# Patient Record
Sex: Male | Born: 1935 | Race: Asian | Hispanic: No | Marital: Married | Smoking: Former smoker
Health system: Southern US, Community
[De-identification: ages and names within clinical notes are randomized; demographics above are authoritative.]

## PROBLEM LIST (undated history)

## (undated) DIAGNOSIS — A048 Other specified bacterial intestinal infections: Secondary | ICD-10-CM

## (undated) DIAGNOSIS — K279 Peptic ulcer, site unspecified, unspecified as acute or chronic, without hemorrhage or perforation: Secondary | ICD-10-CM

## (undated) DIAGNOSIS — F329 Major depressive disorder, single episode, unspecified: Secondary | ICD-10-CM

## (undated) DIAGNOSIS — H9190 Unspecified hearing loss, unspecified ear: Secondary | ICD-10-CM

## (undated) DIAGNOSIS — I739 Peripheral vascular disease, unspecified: Secondary | ICD-10-CM

## (undated) DIAGNOSIS — Z9981 Dependence on supplemental oxygen: Secondary | ICD-10-CM

## (undated) DIAGNOSIS — I2510 Atherosclerotic heart disease of native coronary artery without angina pectoris: Secondary | ICD-10-CM

## (undated) DIAGNOSIS — I714 Abdominal aortic aneurysm, without rupture, unspecified: Secondary | ICD-10-CM

## (undated) DIAGNOSIS — I779 Disorder of arteries and arterioles, unspecified: Secondary | ICD-10-CM

## (undated) DIAGNOSIS — E119 Type 2 diabetes mellitus without complications: Secondary | ICD-10-CM

## (undated) DIAGNOSIS — J45909 Unspecified asthma, uncomplicated: Secondary | ICD-10-CM

## (undated) DIAGNOSIS — M109 Gout, unspecified: Secondary | ICD-10-CM

## (undated) DIAGNOSIS — N400 Enlarged prostate without lower urinary tract symptoms: Secondary | ICD-10-CM

## (undated) DIAGNOSIS — D649 Anemia, unspecified: Secondary | ICD-10-CM

## (undated) DIAGNOSIS — F32A Depression, unspecified: Secondary | ICD-10-CM

## (undated) DIAGNOSIS — I10 Essential (primary) hypertension: Secondary | ICD-10-CM

## (undated) DIAGNOSIS — K449 Diaphragmatic hernia without obstruction or gangrene: Secondary | ICD-10-CM

## (undated) DIAGNOSIS — N179 Acute kidney failure, unspecified: Secondary | ICD-10-CM

## (undated) DIAGNOSIS — I509 Heart failure, unspecified: Secondary | ICD-10-CM

## (undated) HISTORY — DX: Diaphragmatic hernia without obstruction or gangrene: K44.9

## (undated) HISTORY — DX: Unspecified asthma, uncomplicated: J45.909

## (undated) HISTORY — DX: Atherosclerotic heart disease of native coronary artery without angina pectoris: I25.10

## (undated) HISTORY — DX: Acute kidney failure, unspecified: N17.9

## (undated) HISTORY — DX: Major depressive disorder, single episode, unspecified: F32.9

## (undated) HISTORY — DX: Abdominal aortic aneurysm, without rupture, unspecified: I71.40

## (undated) HISTORY — DX: Depression, unspecified: F32.A

## (undated) HISTORY — DX: Disorder of arteries and arterioles, unspecified: I77.9

## (undated) HISTORY — DX: Peptic ulcer, site unspecified, unspecified as acute or chronic, without hemorrhage or perforation: K27.9

## (undated) HISTORY — PX: ESOPHAGOGASTRODUODENOSCOPY: SHX1529

## (undated) HISTORY — DX: Other specified bacterial intestinal infections: A04.8

## (undated) HISTORY — DX: Anemia, unspecified: D64.9

## (undated) HISTORY — DX: Peripheral vascular disease, unspecified: I73.9

## (undated) HISTORY — DX: Abdominal aortic aneurysm, without rupture: I71.4

## (undated) HISTORY — DX: Benign prostatic hyperplasia without lower urinary tract symptoms: N40.0

---

## 2001-06-16 ENCOUNTER — Encounter: Admission: RE | Admit: 2001-06-16 | Discharge: 2001-06-16 | Payer: Self-pay | Admitting: Internal Medicine

## 2001-07-27 ENCOUNTER — Ambulatory Visit (HOSPITAL_COMMUNITY): Admission: RE | Admit: 2001-07-27 | Discharge: 2001-07-27 | Payer: Self-pay | Admitting: Internal Medicine

## 2001-07-27 ENCOUNTER — Encounter: Payer: Self-pay | Admitting: Internal Medicine

## 2001-07-27 ENCOUNTER — Encounter: Admission: RE | Admit: 2001-07-27 | Discharge: 2001-07-27 | Payer: Self-pay | Admitting: Internal Medicine

## 2004-05-20 ENCOUNTER — Inpatient Hospital Stay (HOSPITAL_COMMUNITY): Admission: EM | Admit: 2004-05-20 | Discharge: 2004-05-21 | Payer: Self-pay | Admitting: Emergency Medicine

## 2008-06-13 ENCOUNTER — Encounter (INDEPENDENT_AMBULATORY_CARE_PROVIDER_SITE_OTHER): Payer: Self-pay | Admitting: *Deleted

## 2008-06-13 ENCOUNTER — Inpatient Hospital Stay (HOSPITAL_COMMUNITY): Admission: EM | Admit: 2008-06-13 | Discharge: 2008-06-21 | Payer: Self-pay | Admitting: Emergency Medicine

## 2008-06-13 ENCOUNTER — Ambulatory Visit: Payer: Self-pay | Admitting: Internal Medicine

## 2008-06-13 ENCOUNTER — Ambulatory Visit: Payer: Self-pay | Admitting: Cardiology

## 2008-06-15 ENCOUNTER — Encounter (INDEPENDENT_AMBULATORY_CARE_PROVIDER_SITE_OTHER): Payer: Self-pay | Admitting: Internal Medicine

## 2008-06-19 ENCOUNTER — Encounter: Payer: Self-pay | Admitting: Internal Medicine

## 2008-06-20 ENCOUNTER — Encounter: Payer: Self-pay | Admitting: Cardiology

## 2008-07-03 ENCOUNTER — Encounter: Payer: Self-pay | Admitting: Internal Medicine

## 2008-07-19 ENCOUNTER — Ambulatory Visit: Payer: Self-pay | Admitting: Internal Medicine

## 2008-07-19 DIAGNOSIS — D62 Acute posthemorrhagic anemia: Secondary | ICD-10-CM

## 2008-07-19 DIAGNOSIS — A048 Other specified bacterial intestinal infections: Secondary | ICD-10-CM | POA: Insufficient documentation

## 2008-07-19 DIAGNOSIS — K250 Acute gastric ulcer with hemorrhage: Secondary | ICD-10-CM | POA: Insufficient documentation

## 2008-07-20 LAB — CONVERTED CEMR LAB
Basophils Relative: 0.6 % (ref 0.0–3.0)
Eosinophils Relative: 3.6 % (ref 0.0–5.0)
HCT: 32.1 % — ABNORMAL LOW (ref 39.0–52.0)
Lymphocytes Relative: 14.9 % (ref 12.0–46.0)
Lymphs Abs: 1.3 10*3/uL (ref 0.7–4.0)
Monocytes Absolute: 0.6 10*3/uL (ref 0.1–1.0)
Monocytes Relative: 6.2 % (ref 3.0–12.0)
Neutro Abs: 6.6 10*3/uL (ref 1.4–7.7)
Neutrophils Relative %: 74.7 % (ref 43.0–77.0)
WBC: 8.9 10*3/uL (ref 4.5–10.5)

## 2009-06-07 ENCOUNTER — Ambulatory Visit (HOSPITAL_COMMUNITY): Admission: RE | Admit: 2009-06-07 | Discharge: 2009-06-08 | Payer: Self-pay | Admitting: Urology

## 2009-11-21 ENCOUNTER — Ambulatory Visit (HOSPITAL_COMMUNITY): Admission: RE | Admit: 2009-11-21 | Discharge: 2009-11-21 | Payer: Self-pay | Admitting: Nephrology

## 2010-01-15 ENCOUNTER — Emergency Department (HOSPITAL_COMMUNITY)
Admission: EM | Admit: 2010-01-15 | Discharge: 2010-01-15 | Payer: Self-pay | Source: Home / Self Care | Admitting: Emergency Medicine

## 2010-03-04 ENCOUNTER — Encounter: Payer: Self-pay | Admitting: Cardiology

## 2010-03-25 ENCOUNTER — Emergency Department (HOSPITAL_COMMUNITY): Payer: PRIVATE HEALTH INSURANCE

## 2010-03-25 ENCOUNTER — Encounter (HOSPITAL_COMMUNITY): Payer: Self-pay | Admitting: Radiology

## 2010-03-25 ENCOUNTER — Inpatient Hospital Stay (HOSPITAL_COMMUNITY)
Admission: EM | Admit: 2010-03-25 | Discharge: 2010-03-29 | DRG: 392 | Disposition: A | Discharge status: Home Health | Payer: PRIVATE HEALTH INSURANCE | Attending: Internal Medicine | Admitting: Internal Medicine

## 2010-03-25 DIAGNOSIS — G8929 Other chronic pain: Secondary | ICD-10-CM | POA: Diagnosis present

## 2010-03-25 DIAGNOSIS — N189 Chronic kidney disease, unspecified: Secondary | ICD-10-CM | POA: Diagnosis present

## 2010-03-25 DIAGNOSIS — F341 Dysthymic disorder: Secondary | ICD-10-CM | POA: Diagnosis present

## 2010-03-25 DIAGNOSIS — I4891 Unspecified atrial fibrillation: Secondary | ICD-10-CM | POA: Diagnosis present

## 2010-03-25 DIAGNOSIS — I129 Hypertensive chronic kidney disease with stage 1 through stage 4 chronic kidney disease, or unspecified chronic kidney disease: Secondary | ICD-10-CM | POA: Diagnosis present

## 2010-03-25 DIAGNOSIS — M109 Gout, unspecified: Secondary | ICD-10-CM | POA: Diagnosis present

## 2010-03-25 DIAGNOSIS — J4489 Other specified chronic obstructive pulmonary disease: Secondary | ICD-10-CM | POA: Diagnosis present

## 2010-03-25 DIAGNOSIS — I252 Old myocardial infarction: Secondary | ICD-10-CM

## 2010-03-25 DIAGNOSIS — T380X5A Adverse effect of glucocorticoids and synthetic analogues, initial encounter: Secondary | ICD-10-CM | POA: Diagnosis present

## 2010-03-25 DIAGNOSIS — R109 Unspecified abdominal pain: Principal | ICD-10-CM | POA: Diagnosis present

## 2010-03-25 DIAGNOSIS — N179 Acute kidney failure, unspecified: Secondary | ICD-10-CM | POA: Diagnosis present

## 2010-03-25 DIAGNOSIS — J449 Chronic obstructive pulmonary disease, unspecified: Secondary | ICD-10-CM | POA: Diagnosis present

## 2010-03-25 DIAGNOSIS — I959 Hypotension, unspecified: Secondary | ICD-10-CM | POA: Diagnosis present

## 2010-03-25 DIAGNOSIS — E139 Other specified diabetes mellitus without complications: Secondary | ICD-10-CM | POA: Diagnosis present

## 2010-03-25 DIAGNOSIS — R0789 Other chest pain: Secondary | ICD-10-CM | POA: Diagnosis present

## 2010-03-25 DIAGNOSIS — M1710 Unilateral primary osteoarthritis, unspecified knee: Secondary | ICD-10-CM | POA: Diagnosis present

## 2010-03-25 LAB — COMPREHENSIVE METABOLIC PANEL
ALT: 14 U/L (ref 0–53)
AST: 21 U/L (ref 0–37)
BUN: 22 mg/dL (ref 6–23)
Calcium: 9 mg/dL (ref 8.4–10.5)
Creatinine, Ser: 2.2 mg/dL — ABNORMAL HIGH (ref 0.4–1.5)
Total Bilirubin: 0.8 mg/dL (ref 0.3–1.2)

## 2010-03-25 LAB — DIFFERENTIAL
Basophils Absolute: 0 10*3/uL (ref 0.0–0.1)
Eosinophils Relative: 4 % (ref 0–5)
Lymphocytes Relative: 19 % (ref 12–46)
Lymphs Abs: 1.6 10*3/uL (ref 0.7–4.0)
Monocytes Relative: 10 % (ref 3–12)
Neutro Abs: 5.5 10*3/uL (ref 1.7–7.7)
Neutrophils Relative %: 67 % (ref 43–77)

## 2010-03-25 LAB — LIPID PANEL
Cholesterol: 183 mg/dL (ref 0–200)
HDL: 34 mg/dL — ABNORMAL LOW (ref >39)
Triglycerides: 201 mg/dL — ABNORMAL HIGH (ref <150)
VLDL: 40 mg/dL (ref 0–40)

## 2010-03-25 LAB — MRSA PCR SCREENING: MRSA by PCR: NEGATIVE

## 2010-03-25 LAB — URINALYSIS, ROUTINE W REFLEX MICROSCOPIC
Bilirubin Urine: NEGATIVE
Hgb urine dipstick: NEGATIVE
Ketones, ur: NEGATIVE mg/dL
Nitrite: NEGATIVE
Urobilinogen, UA: 0.2 mg/dL (ref 0.0–1.0)

## 2010-03-25 LAB — CBC
HCT: 34.1 % — ABNORMAL LOW (ref 39.0–52.0)
MCHC: 34.9 g/dL (ref 30.0–36.0)
MCV: 82.2 fL (ref 78.0–100.0)
Platelets: 232 10*3/uL (ref 150–400)

## 2010-03-25 LAB — CARDIAC PANEL(CRET KIN+CKTOT+MB+TROPI)
CK, MB: 0.9 ng/mL (ref 0.3–4.0)
CK, MB: 1.3 ng/mL (ref 0.3–4.0)
Relative Index: INVALID (ref 0.0–2.5)
Total CK: 73 U/L (ref 7–232)
Total CK: 90 U/L (ref 7–232)

## 2010-03-25 LAB — LACTIC ACID, PLASMA: Lactic Acid, Venous: 1.3 mmol/L (ref 0.5–2.2)

## 2010-03-25 LAB — PROCALCITONIN: Procalcitonin: <0.1 ng/mL

## 2010-03-25 LAB — TSH: TSH: 2.236 u[IU]/mL (ref 0.350–4.500)

## 2010-03-25 LAB — CK TOTAL AND CKMB (NOT AT ARMC)
CK, MB: 0.8 ng/mL (ref 0.3–4.0)
Relative Index: INVALID (ref 0.0–2.5)

## 2010-03-26 LAB — CBC
HCT: 31.7 % — ABNORMAL LOW (ref 39.0–52.0)
Hemoglobin: 11.1 g/dL — ABNORMAL LOW (ref 13.0–17.0)
RBC: 3.9 MIL/uL — ABNORMAL LOW (ref 4.22–5.81)

## 2010-03-26 LAB — PHOSPHORUS: Phosphorus: 3.2 mg/dL (ref 2.3–4.6)

## 2010-03-26 LAB — BASIC METABOLIC PANEL
CO2: 23 mEq/L (ref 19–32)
Chloride: 113 mEq/L — ABNORMAL HIGH (ref 96–112)
GFR calc Af Amer: 51 mL/min — ABNORMAL LOW (ref >60)
Sodium: 143 mEq/L (ref 135–145)

## 2010-03-27 ENCOUNTER — Inpatient Hospital Stay (HOSPITAL_COMMUNITY): Payer: PRIVATE HEALTH INSURANCE

## 2010-03-27 LAB — COMPREHENSIVE METABOLIC PANEL
ALT: 8 U/L (ref 0–53)
CO2: 22 mEq/L (ref 19–32)
Calcium: 8.6 mg/dL (ref 8.4–10.5)
GFR calc non Af Amer: 44 mL/min — ABNORMAL LOW (ref >60)
Glucose, Bld: 86 mg/dL (ref 70–99)
Sodium: 139 mEq/L (ref 135–145)

## 2010-03-27 LAB — CARDIAC PANEL(CRET KIN+CKTOT+MB+TROPI): Total CK: 68 U/L (ref 7–232)

## 2010-03-27 LAB — CBC
HCT: 34.3 % — ABNORMAL LOW (ref 39.0–52.0)
Hemoglobin: 11.8 g/dL — ABNORMAL LOW (ref 13.0–17.0)
MCH: 28.2 pg (ref 26.0–34.0)
MCHC: 34.4 g/dL (ref 30.0–36.0)
MCV: 82.1 fL (ref 78.0–100.0)

## 2010-03-27 LAB — BRAIN NATRIURETIC PEPTIDE: Pro B Natriuretic peptide (BNP): 275 pg/mL — ABNORMAL HIGH (ref 0.0–100.0)

## 2010-03-27 NOTE — Discharge Summary (Signed)
[name], [name]          ACCOUNT NO.:  [id number]  MEDICAL RECORD NO.:  [id number]           PATIENT TYPE:  I  LOCATION:  1222                         FACILITY:  [facility]  PHYSICIAN:  Charlestine Massed, MDDATE OF BIRTH:  1926-04-02  DATE OF ADMISSION:  03/25/2010 DATE OF DISCHARGE:                        DISCHARGE SUMMARY - REFERRING   PRIMARY CARE PHYSICIAN:  [name]. Andrey Campanile, M.D.  REASON FOR ADMISSION:  Abdominal pain, dizziness and diarrhea.  DISCHARGE DIAGNOSES: 1. Chronic abdominal pain - pain radiating from the posterior     vertebral spine all the way anteriorly in the lower areas -     possibly secondary to neuropathic pain due to degenerative disk     disease with a nerve impingement - no evidence of any other intra-     abdominal pathology identified by all testing currently and in the     past by specialists. 2. 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 3. Decrease p.o. intake. 4. Acute kidney injury on top of chronic kidney disease stage 3 -     baseline creatinine 1.4, and current creatinine close to baseline     at 1.59 - improving 5. Hypertension, currently stable. 6. Chronic gout with no acute issues. 7. Anxiety with insomnia. 8. 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  DISCHARGE MEDICATIONS: 1. Tylenol Extra Strength 2 tablets p.o. q.6 h p.r.n. for arthritic     pain. 2. Neurontin 300 mg p.o. three times daily. 3. Cymbalta 30 mg p.o. daily. 4. Imdur 30 mg p.o. daily. 5. Tylenol No. 3, 1-2 tablets p.o. q.6 h p.r.n. for pain. 6. Omeprazole 20 mg p.o. daily in a.m. 7. Flomax 0.4 mg p.o. daily. 8. Colcrys 0.6 mg 1 tablet p.o. daily. 9. Cardizem CD 120 mg p.o. daily in a.m. 10.Zocor 20 mg p.o. daily.  HOSPITAL COURSE: 1. Abdominal pain - existing for 16 years.  Today, a [place] Web designer was brought in, and we had a full family meeting with     the patient, his two daughters and the interpreter.  One of  the     daughters speaks [place].  All symptoms and signs were asked and     analyzed fully and thoroughly with the help of the interpreter.     The physical examination was also conducted with the aid of the     interpreter in getting all symptoms accurately.  The patient     clearly stated that the pain comes in the lower abdomen.  He has     been on antibiotics multiple times, thinking that it could be     bacterial growth, and so there is no reason to continue those     things.  Also, he  clearly stated that there is an area of     tenderness in the L3 and 4 vertebra, and the pain radiates from     there all the way up to anteriorly to the front which, goes in     favor of a neuropathic pain.  This was clearly explained to the     family and the patient.  It was explained to them that the pain is     going to be an ongoing issue and is not going to be totally     eradicated, but we can reduce the level of neuropathic pain by     starting on gabapentin and a dose of Cymbalta, which will not only     give some help to the neuropathic pain but also can help with the     depression which the patient has.  As per the daughter, he is very     active at home but he does not eat most of the time. We are also prescribing him some Tylenol No. 3 tablets, which can be used for excruciating pain.  The patient looks like someone who can tolerate a lot of pain as he has been used to for the past 16 years. 1. Hypertension.  The patient came in with acute kidney injury, which     was possibly due to dehydration, and so the lisinopril was stopped.     I am starting him on Cardizem CD at 120 mg p.o. daily for proper     control of blood pressure.  His Imdur can also be  continued.  Any     further increase in blood pressure management can be done by the     PMD as outpatient. 2. Chronic gout.  Currently, he does not have any issues with gout.  I     would reduce the colchicine to 0.6 mg p.o. daily for  renal     clearance, adjust it for CKD stage 3.  FOLLOWUP:  Follow up with Dr. Benedetto Goad in one week's time.  INSTRUCTIONS: 1. Fall precautions. 2. Pain management as mentioned above, and the family and the patient     have exhibited understanding.  SUBJECTIVE:  GENERAL:  The patient is examined today.  The patient is awake, alert, not in any distress, afebrile.  Pain:  Still has pain occasionally. VITAL SIGNS:  Temperature 98.6, heart rate 87, respirations 22, blood pressure 113/73, O2 saturation is 98% on room air. HEAD AND NECK:  No JVD.  No bruits. CHEST:  Bilateral air entry is good anteriorly and posteriorly.  No rales or wheezes heard.  There is some tenderness on the right lower ribs. CARDIAC:  S1, S2 regular.  No murmurs. ABDOMEN:  The patient states that he has palpable tenderness all over the abdomen when examined, but there was no definite area  of specific localized tenderness. MUSCULOSKELETAL:  Tenderness on the lower lumbar vertebrae on palpation. EXTREMITIES:  No edema.  LABORATORY DATA:  A BMP today revealed a sodium of 143, potassium 3.9, chloride 113, bicarb 23, glucose 82, BUN 16, creatinine 1.5, calcium8.8, magnesium 1.8, phosphorus 3.2.  CBC:  WBC 8.1, hemoglobin 11.1, hematocrit 31.7 and platelets 225.  ESR is 50.  C. diff testing is negative in stool.  Lactic acid 1.3, normal. Procalcitonin less than 0.1.  Stool ova and parasites negative.  Urine culture is negative. Lipid profile lists total cholesterol 183, triglycerides 201, HDL 34, LDL 109 and VLDL 40.  DISPOSITION:  Will be discharged back home tomorrow in a.m. if stable.     Charlestine Massed, MD     UT/MEDQ  D:  03/26/2010  T:  03/26/2010  Job:  [id number]  cc:   [name]. Andrey Campanile, M.D. Fax: [phone]  Electronically Signed by Charlestine Massed MD on 03/27/2010 03:14:55 PM

## 2010-03-28 DIAGNOSIS — R0789 Other chest pain: Secondary | ICD-10-CM

## 2010-03-28 DIAGNOSIS — I4891 Unspecified atrial fibrillation: Secondary | ICD-10-CM

## 2010-03-28 LAB — CBC
Hemoglobin: 12.2 g/dL — ABNORMAL LOW (ref 13.0–17.0)
MCH: 28.8 pg (ref 26.0–34.0)
RBC: 4.23 MIL/uL (ref 4.22–5.81)

## 2010-03-28 LAB — COMPREHENSIVE METABOLIC PANEL
ALT: 8 U/L (ref 0–53)
AST: 40 U/L — ABNORMAL HIGH (ref 0–37)
Alkaline Phosphatase: 66 U/L (ref 39–117)
CO2: 17 mEq/L — ABNORMAL LOW (ref 19–32)
Chloride: 105 mEq/L (ref 96–112)
GFR calc Af Amer: 55 mL/min — ABNORMAL LOW (ref >60)
GFR calc non Af Amer: 46 mL/min — ABNORMAL LOW (ref >60)
Potassium: 4 mEq/L (ref 3.5–5.1)
Sodium: 133 mEq/L — ABNORMAL LOW (ref 135–145)
Total Bilirubin: 0.5 mg/dL (ref 0.3–1.2)

## 2010-03-28 LAB — BASIC METABOLIC PANEL
CO2: 21 mEq/L (ref 19–32)
Chloride: 100 mEq/L (ref 96–112)
GFR calc Af Amer: 58 mL/min — ABNORMAL LOW (ref >60)
Sodium: 129 mEq/L — ABNORMAL LOW (ref 135–145)

## 2010-03-28 LAB — CARDIAC PANEL(CRET KIN+CKTOT+MB+TROPI)
CK, MB: 1.5 ng/mL (ref 0.3–4.0)
Relative Index: 1.1 (ref 0.0–2.5)

## 2010-03-28 LAB — LACTIC ACID, PLASMA: Lactic Acid, Venous: 1.9 mmol/L (ref 0.5–2.2)

## 2010-03-29 ENCOUNTER — Inpatient Hospital Stay (HOSPITAL_COMMUNITY): Payer: PRIVATE HEALTH INSURANCE

## 2010-03-29 LAB — STOOL CULTURE

## 2010-03-29 LAB — GLUCOSE, CAPILLARY: Glucose-Capillary: 172 mg/dL — ABNORMAL HIGH (ref 70–99)

## 2010-03-29 MED ORDER — TECHNETIUM TO 99M ALBUMIN AGGREGATED
4.6000 | Freq: Once | INTRAVENOUS | Status: AC | PRN
  Administered 2010-03-29: 5 via INTRAVENOUS

## 2010-03-29 MED ORDER — XENON XE 133 GAS
9.8000 | GAS_FOR_INHALATION | Freq: Once | RESPIRATORY_TRACT | Status: AC | PRN
  Administered 2010-03-29: 10 via RESPIRATORY_TRACT

## 2010-03-29 NOTE — Consult Note (Signed)
  [name], EIDEM          ACCOUNT NO.:  [id number]  MEDICAL RECORD NO.:  [id number]           PATIENT TYPE:  I  LOCATION:  1222                         FACILITY:  [facility]  PHYSICIAN:  Shaquel Chavous C. Naviah Belfield, MD, FACCDATE OF BIRTH:  1926/04/06  DATE OF CONSULTATION: DATE OF DISCHARGE:                                CONSULTATION   ADDENDUM: Looking back to his records, we saw the patient in April 2006.  At that time, he had a non-ST segment elevation MI secondary to hemorrhagic shock from a GI bleed.  Troponins at that time were elevated at 4.35. We felt that this was demand ischemia from his shock and bleed.  He had a normal 2-D echocardiogram, it is mentioned in our earlier consult today.  His EF was normal with no Kylah Maresh motion abnormalities.  He had a low-risk dobutamine Myoview with normal perfusion, EF 49%.  He was discharged at that time on a beta-blocker.  I do not see the source of his bleeding in the transcription services and the e-chart.  However, with this amount of bleeding in the past, I think this also makes him not a good candidate for Coumadin.     Candy Leverett C. Daleen Squibb, MD, [facility]     TCW/MEDQ  D:  03/28/2010  T:  03/29/2010  Job:  [id number]  Electronically Signed by Valera Castle MD [facility] on 03/29/2010 09:16:19 AM

## 2010-04-10 NOTE — Discharge Summary (Signed)
[name], [name]          ACCOUNT NO.:  [id number]  MEDICAL RECORD NO.:  [id number]           PATIENT TYPE:  I  LOCATION:  1222                         FACILITY:  [facility]  PHYSICIAN:  Cherae Marton I Alva Broxson, MD      DATE OF BIRTH:  [birth year]  DATE OF ADMISSION:  03/25/2010 DATE OF DISCHARGE:  03/29/2010                              DISCHARGE SUMMARY   PRIMARY CARE PHYSICIAN:  [name]. Andrey Campanile, M.D.  DISCHARGE DIAGNOSES: 1. Chronic abdominal pain, being investigated by Dr. Elnoria Howard and his     primary care physician, no acute abdominal pain and pain completely     resolved and the patient tolerated 100% of his meal. 2. Acute gouty arthritis, improved. 3. Steroid-induced hyperglycemia. 4. Hemoglobin A1c to be checked by his primary care physician. 5. Chest pain, atypical. 6. History of paroxysmal atrial fibrillation, rate under controlled. 7. History of non-ST myocardial infarction. 8. Gastrointestinal bleeding on 2010 secondary to gastric ulcer and     nonsteroid antiinflammatory drugs. 9. History of chronic obstructive pulmonary disease. 10.Chronic kidney disease, baseline creatinine 1.4.  DISCHARGE MEDICATIONS: 1. Protonix 40 mg p.o. daily. 2. Enteric-coated aspirin 81 mg daily. 3. Cymbalta 30 mg daily. 4. Prednisone 10 mg taper dose to complete 7 days. 5. Zocor 20 mg p.o. daily. 6. Colchicine 0.6 mg daily. 7. Flomax 0.4 mg daily. 8. Imdur 30 mg p.o. daily. 9. Lisinopril 5 mg daily.  CONSULTATION:  Cardiology consulted for evaluation of chest discomfort and atrial flutter.  PROCEDURES: 1. CT head, no acute intracranial abnormality. 2. Chest x-ray, no acute disease. 3. V/Q scan, negative for pulmonary embolism. 4. 2-D echo, not done.  Last 2-D echo on May 2010 did show a systolic     function of 55% to 60%, wall motion was normal.  There were no     regional wall motion abnormalities.  HOSPITAL COURSE: 1. Abdominal pain.  The patient has a history of chronic abdominal    pain, further history, as per the daughter, the patient has 17     years of abdominal pain and this has completely resolved.  His     abdominal pain is chronic and needs to be further followed by his     physician.  Lactic acid was found to be 1.9.  The patient had a     recent CT abdomen and pelvis without contrast secondary to renal     insufficiency.  It did not show any acute event.  He also had     ultrasound of his kidneys in October 2011 and it did show chronic     medical renal disease, no obstructive uropathy.  During hospital     stay, the patient's pain completely resolved and the patient     tolerated 100% of his meal. 2. Acute-on-chronic renal insufficiency, improved, and the patient     remained on his lisinopril 5 mg p.o. daily. 3. Chest pain.  As the patient has a history of non-ST MI, Cardiology     was consulted.  The patient's cardiac enzymes were cycled and were     negative.  D-dimer was mildly elevated.  V/Q  scan was negative.     The patient had low probability for PE.  The patient was seen by     Dr. Valera Castle this admission and in the past.  The patient     noticed to have atrial fibrillation on his telemonitor and on his     EKG.  The patient's risk for stroke is mainly age and hypertension.     Cardiology did not recommend any Coumadin for the time being as the     medical compliance may also be a major issue with Coumadin and with     language and culture barrier.  Cardiology recommended aspirin.     Accordingly, the patient will be discharged with aspirin 81 mg p.o.     daily for protection against stroke.  We understand the patient has     a history of GI bleeding in the past.  The patient was advised to     follow up closely with his physician, report to the hospital if he     has any evidence of dark stool.  His heart rate remained under     control.  The patient was started briefly on Cardizem, which was     stopped secondary to bradycardia. 4. Acute  renal failure, which is improved, his creatinine currently is     1.4. 5. Gouty arthropathy.  The patient will be discharged with a short     course of steroids.  Currently, we felt the patient is medically stable for discharge.  He needs to follow up with his physician within the next week.     Jonathon Manrique Bosie Helper, MD     HIE/MEDQ  D:  03/29/2010  T:  03/30/2010  Job:  [id number]  Electronically Signed by Ebony Cargo MD on 04/10/2010 02:15:18 PM

## 2010-04-10 NOTE — Consult Note (Signed)
NAMEDAMONTAY, ALRED          ACCOUNT NO.:  [id number]  MEDICAL RECORD NO.:  [id number]           PATIENT TYPE:  I  LOCATION:  1222                         FACILITY:  [facility]  PHYSICIAN:  Denni France C. Teja Judice, MD, FACCDATE OF BIRTH:  May 18, 1926  DATE OF CONSULTATION: DATE OF DISCHARGE:                                CONSULTATION   IDENTIFICATION DATA:  Dr. Berkley Harvey of the [facility] to evaluate this patient with chest discomfort and possible atrial fibrillation.  HISTORY OF PRESENT ILLNESS:  [name] is an 77 year old Asian male, who was brought to the Emergency Room on February 13 with abdominal pain and diarrhea for 3 days.  He has a history of chronic lower abdominal pain for 16 years.  He was found to be orthostatic in the emergency room, was found to be severely orthostatic and hypotensive.  He was admitted for hydration.  On 2/15 at 8 a.m., he was noted to have some labored breathing with some expiratory wheezing.  EKG showed no acute changes.  Troponin was 0.01 with a CPK-MB of 0.7.  Subsequent cardiac enzymes have been negative. All EKGs have been stable.  There was report that he had some atrial fibrillation.  Looking through the EKGs, one was read by the computer showing atrial fib but is not. Looking back to the chart, he has had 1 EKG with atrial fib but this was in May 2010.  The family says currently he is having no discomfort.  It is difficult to translate this based on language barrier.  The lady, I spoke to, does speak some [place].  Looking back to his chart, when he had atrial fibrillation in May 2010, he had a 2-D echocardiogram.  This showed mild LVH and he does have a history of hypertension.  In addition, there was a mild increase in pulmonary systolic pressures at 35.  There were no segmental Marquice Uddin motion abnormalities.  His EKG at that time also showed similar pattern with Q waves in III and aVF.  He does have a first degree A-V  block.  He is currently hydrating.  His creatinine has dropped.  He was on lisinopril and isosorbide prior to admission.  PAST MEDICAL HISTORY:  History of non-STEMI in the past.  He has a history of hypertension, chronic gout, anxiety, degenerative joint disease, chronic abdominal pain, depression.  MEDICATIONS:  His medications at home per medicine reconciliation: 1. Flomax 0.4 mg daily. 2. Omeprazole 20 mg p.o. daily 3. Isosorbide mononitrate 30 mg a day. 4. Colchicine 0.6 mg p.o. twice daily. 5. Lisinopril 5 mg a day.  ALLERGIES:  He has no known drug allergies.  FAMILY HISTORY:  His family history is not obtainable because of the language barrier.  It is not in the HPI.  SOCIAL HISTORY:  Lives with his family.  Originally from [place].  [place]  He is a former smoker.  He quit smoking 2 years ago.  Does not use any alcohol and does not use any illicit drugs.  REVIEW OF SYSTEMS:  Other than the above is negative.  History again very difficult to obtain.  PHYSICAL EXAMINATION:  GENERAL:  He is lying flat.  No acute distress. Skin is warm and dry. VITAL SIGNS:  His blood pressure is 131/70, pulse 78.  He is in sinus rhythm.  Telemetry strips reviewed.  Temperature is 97.9, sats 96% on room air. HEENT:  Poor dentition. NECK:  Supple.  Carotid upstrokes equal bilaterally without bruits. Thyroid is not enlarged.  Trachea is midline. LUNGS:  Some inspiratory and expiratory rhonchi. HEART:  Nondisplaced PMI.  Soft S1, S2 that splits.  No rub or gallop. ABDOMEN:  Soft, good bowel sounds.  I did not press deeply because of his chronic abdominal pain. EXTREMITIES:  No cyanosis, clubbing or edema.  Pulses are brisk. NEUROLOGIC:  Nonfocal.  LABORATORY DATA:  All x-rays and lab results reviewed.  He had a BNP that was slightly up to 275 on February 15.  This was drawn at the time of his shortness of breath.  ASSESSMENT: 1. Noncardiac chest  pain.  No electrocardiographic or telemetric     evidence of atrial fibrillation on this admission.  However, he has     had atrial fibrillation in the past in May 2010 picked up on a 12-     lead EKG.  At that time, he had good rate control.  His echo at     that time showed mild LVH with normal systolic function, ejection     fraction of 55% to 60%.  He had mild elevation of pulmonary     pressures at 35 mm.  There was no sign of an inferior Marsia Cino infarct.     At that time his EKG also showed a similar pseudo inferior Adell Koval     infarction pattern. 2. Hypertension. 3. Acute-on-chronic renal insufficiency. 4. Hypokalemia on admission secondary to diarrhea, now corrected.  RECOMMENDATIONS:  I would put him back on lisinopril which he was on at low dose prior to admission.  I would start aspirin 325 mg a day for his atrial fib.  I do not think he needs Coumadin being a low CHADS-VASC score.  Medical compliance may also be a major issue with Coumadin with the language and cultural barrier.     Alasdair Kleve C. Daleen Squibb, MD, [facility]     TCW/MEDQ  D:  03/28/2010  T:  03/29/2010  Job:  [id number]  Electronically Signed by Valera Castle MD [facility] on 04/10/2010 08:29:09 AM

## 2010-04-15 NOTE — H&P (Signed)
NAMELEMAR, [name]          ACCOUNT NO.:  [id number]  MEDICAL RECORD NO.:  [id number]           PATIENT TYPE:  E  LOCATION:  WLED                         [facility]ACILITY:  [facility]  PHYSICIAN:  Della Goo, M.D. DATE OF BIRTH:  29-Apr-1926  DATE OF ADMISSION:  03/25/2010 DATE OF DISCHARGE:                             HISTORY & PHYSICAL   PRIMARY CARE PHYSICIAN:  [name]. Andrey Campanile, MD  CHIEF COMPLAINT:  Abdominal pain, dizziness, diarrhea.  HISTORY OF PRESENT ILLNESS:  This is an 77 year old male who was brought to the emergency department by his family secondary to worsening lower abdominal pain and diarrhea for the past 2-3 days.  Patient has had nausea with dry heaves.  He denies any fever or chills.  Of note, the patient has a history of chronic lower abdominal pain for the past 16 years. The patient's daughter reports that he has seen many specialists in the past and all of his workups have been negative.  Please also note that the patient speaks no [place] and his daughter is at the bedside, providing translation for now.  The patient does report having dizziness and weakness.  PAST MEDICAL HISTORY:  Significant for hypertension, benign prostatic hypertrophy with bladder outlet obstruction status post TURP in April 2011, peptic ulcer disease, gout, and chronic abdominal pain.  MEDICATIONS:  At this time include lisinopril, Imdur, omeprazole, Colcrys, and tamsulosin.  ALLERGIES:  No known drug allergies.  SOCIAL HISTORY:  The patient lives at home with his family.  He is originally from [place].  He has been in the [place] for about 22 years.  He is a former smoker.  He quit 2 years ago.  He is a nondrinker.  No history of illicit drug usage.  FAMILY HISTORY:  Noncontributory.  REVIEW OF SYSTEMS:  Pertinents mentioned above.  PHYSICAL EXAMINATION FINDINGS:  GENERAL:  This is an 77 year old, thin, elderly, Asian male who is in discomfort and mild  distress. VITAL SIGNS:  Temperature 97.7; blood pressure initially 105/62, but this did decrease to 61/31; heart rate 106, respirations 20; O2 saturations 95% to 97%. HEENT:  Normocephalic, atraumatic.  Pupils equally round, reactive to light.  Extraocular movements are intact.  Funduscopic benign.  There is no scleral icterus.  Nares are patent bilaterally.  Oropharynx, patient is edentulous.  There are no exudates or erythema. NECK:  Supple.  Full range of motion.  No thyromegaly, adenopathy, jugular venous distention. CARDIOVASCULAR: Regular rate and rhythm.  No murmurs, gallops, or rubs appreciated. LUNGS:  Clear to auscultation bilaterally.  No rales, rhonchi, or wheezes. ABDOMEN:  Positive bowel sounds, soft, mildly tender in the left lower quadrant area.  There is no rebound or guarding.  There is no hepatosplenomegaly. EXTREMITIES:  Without cyanosis, clubbing, or edema. NEUROLOGIC:  Nonfocal.  LABORATORY STUDIES:  White blood cell count 8.3, hemoglobin 11.9, hematocrit 34.1, MCV 82.2, platelets 232, neutrophils 67%, lymphocytes 19%.  Sodium 138, potassium 3.2, chloride 105, carbon dioxide 24, BUN 22, creatinine 2.20, and glucose 128.  Total protein 7.0, albumin 3.6, AST 21, ALT 14, and calcium 9.0.  Chest x-ray reveals no acute disease process.  CT scan of the  head performed due to the patient's dizziness, no acute intracranial abnormalities are seen, atrophy and chronic microvascular ischemic changes are seen, small right mastoid effusion seen. Acute abdominal series ordered and is pending at this time.  ASSESSMENT:  An 77 year old male being admitted with, 1. Hypotension. 2. Diarrhea. 3. Nausea. 4. Acute renal failure. 5. Chronic abdominal pain. 6. Hypokalemia.  PLAN:  The patient will be admitted to the step-down ICU area.  He has been placed on IV fluids for fluid resuscitation and rehydration.  His blood pressure is responding to IV fluids.  An acute abdominal  series has been ordered to evaluate for the abdominal pain.  The patient may need a CT scan of the abdomen and pelvis, however, at this time, his BUN and creatinine is elevated.  A urinalysis will be ordered. Potassium replacement therapy will be ordered.  The patient will be placed on clear liquids for now and antiemetics have been ordered.  Pain control therapy will also be ordered as the patient's blood pressure tolerates. Please note that in the past the patient did have a GI workup by Dr. Evette Cristal of [facility].  The patient will be placed in contact isolation secondary to his diarrhea and stool studies will be ordered for culture and sensitivity, C difficile PCR and for ova and parasites.  The patient will be placed on DVT prophylaxis at this time.  He is a full code.  Further workup will ensue pending results of his clinical course.     Della Goo, M.D.     HJ/MEDQ  D:  03/25/2010  T:  03/25/2010  Job:  [id number]  cc:   [name]. Andrey Campanile, M.D. Fax: [phone]  Electronically Signed by Della Goo M.D. on 04/15/2010 08:17:46 PM

## 2010-04-23 LAB — COMPREHENSIVE METABOLIC PANEL
ALT: 15 U/L (ref 0–53)
Alkaline Phosphatase: 86 U/L (ref 39–117)
CO2: 27 mEq/L (ref 19–32)
Calcium: 9.6 mg/dL (ref 8.4–10.5)
GFR calc non Af Amer: 37 mL/min — ABNORMAL LOW (ref >60)
Glucose, Bld: 106 mg/dL — ABNORMAL HIGH (ref 70–99)
Sodium: 138 mEq/L (ref 135–145)

## 2010-04-23 LAB — CBC
HCT: 38.6 % — ABNORMAL LOW (ref 39.0–52.0)
Hemoglobin: 13.3 g/dL (ref 13.0–17.0)
MCHC: 34.5 g/dL (ref 30.0–36.0)

## 2010-04-23 LAB — DIFFERENTIAL
Basophils Relative: 0 % (ref 0–1)
Eosinophils Absolute: 0.4 10*3/uL (ref 0.0–0.7)
Lymphs Abs: 1.5 10*3/uL (ref 0.7–4.0)
Neutrophils Relative %: 76 % (ref 43–77)

## 2010-04-23 LAB — URINALYSIS, ROUTINE W REFLEX MICROSCOPIC
Bilirubin Urine: NEGATIVE
Glucose, UA: NEGATIVE mg/dL
Hgb urine dipstick: NEGATIVE
Ketones, ur: NEGATIVE mg/dL
pH: 7 (ref 5.0–8.0)

## 2010-04-23 LAB — LIPASE, BLOOD: Lipase: 28 U/L (ref 11–59)

## 2010-04-30 LAB — CBC
Platelets: 274 10*3/uL (ref 150–400)
WBC: 6.5 10*3/uL (ref 4.0–10.5)

## 2010-05-21 LAB — HEMOCCULT GUIAC POC 1CARD (OFFICE): Fecal Occult Bld: POSITIVE

## 2010-05-21 LAB — BASIC METABOLIC PANEL
BUN: 14 mg/dL (ref 6–23)
BUN: 14 mg/dL (ref 6–23)
BUN: 9 mg/dL (ref 6–23)
CO2: 20 mEq/L (ref 19–32)
CO2: 21 mEq/L (ref 19–32)
CO2: 22 mEq/L (ref 19–32)
CO2: 23 mEq/L (ref 19–32)
CO2: 24 mEq/L (ref 19–32)
Calcium: 7.6 mg/dL — ABNORMAL LOW (ref 8.4–10.5)
Calcium: 7.8 mg/dL — ABNORMAL LOW (ref 8.4–10.5)
Calcium: 8.1 mg/dL — ABNORMAL LOW (ref 8.4–10.5)
Calcium: 9 mg/dL (ref 8.4–10.5)
Calcium: 9.3 mg/dL (ref 8.4–10.5)
Chloride: 103 mEq/L (ref 96–112)
Chloride: 104 mEq/L (ref 96–112)
Chloride: 107 mEq/L (ref 96–112)
Chloride: 107 mEq/L (ref 96–112)
Chloride: 109 mEq/L (ref 96–112)
Chloride: 112 mEq/L (ref 96–112)
Creatinine, Ser: 1.16 mg/dL (ref 0.4–1.5)
Creatinine, Ser: 1.36 mg/dL (ref 0.4–1.5)
Creatinine, Ser: 1.36 mg/dL (ref 0.4–1.5)
Creatinine, Ser: 1.36 mg/dL (ref 0.4–1.5)
Creatinine, Ser: 1.46 mg/dL (ref 0.4–1.5)
GFR calc Af Amer: 55 mL/min — ABNORMAL LOW (ref >60)
GFR calc Af Amer: 56 mL/min — ABNORMAL LOW (ref >60)
GFR calc Af Amer: 57 mL/min — ABNORMAL LOW (ref >60)
GFR calc Af Amer: >60 mL/min (ref >60)
GFR calc Af Amer: >60 mL/min (ref >60)
GFR calc Af Amer: >60 mL/min (ref >60)
GFR calc non Af Amer: 45 mL/min — ABNORMAL LOW (ref >60)
GFR calc non Af Amer: 47 mL/min — ABNORMAL LOW (ref >60)
Glucose, Bld: 102 mg/dL — ABNORMAL HIGH (ref 70–99)
Glucose, Bld: 106 mg/dL — ABNORMAL HIGH (ref 70–99)
Glucose, Bld: 111 mg/dL — ABNORMAL HIGH (ref 70–99)
Glucose, Bld: 128 mg/dL — ABNORMAL HIGH (ref 70–99)
Glucose, Bld: 93 mg/dL (ref 70–99)
Glucose, Bld: 96 mg/dL (ref 70–99)
Potassium: 3.4 mEq/L — ABNORMAL LOW (ref 3.5–5.1)
Sodium: 133 mEq/L — ABNORMAL LOW (ref 135–145)
Sodium: 134 mEq/L — ABNORMAL LOW (ref 135–145)
Sodium: 134 mEq/L — ABNORMAL LOW (ref 135–145)
Sodium: 137 mEq/L (ref 135–145)
Sodium: 139 mEq/L (ref 135–145)

## 2010-05-21 LAB — CARDIAC PANEL(CRET KIN+CKTOT+MB+TROPI)
CK, MB: 11.7 ng/mL — ABNORMAL HIGH (ref 0.3–4.0)
Total CK: 122 U/L (ref 7–232)
Total CK: 175 U/L (ref 7–232)
Troponin I: 1.93 ng/mL (ref 0.00–0.06)
Troponin I: 2.92 ng/mL (ref 0.00–0.06)
Troponin I: 4.35 ng/mL (ref 0.00–0.06)

## 2010-05-21 LAB — COMPREHENSIVE METABOLIC PANEL
ALT: 12 U/L (ref 0–53)
AST: 21 U/L (ref 0–37)
CO2: 24 mEq/L (ref 19–32)
Chloride: 106 mEq/L (ref 96–112)
GFR calc Af Amer: 51 mL/min — ABNORMAL LOW (ref >60)
GFR calc non Af Amer: 42 mL/min — ABNORMAL LOW (ref >60)
Sodium: 137 mEq/L (ref 135–145)
Total Bilirubin: 0.4 mg/dL (ref 0.3–1.2)

## 2010-05-21 LAB — DIFFERENTIAL
Basophils Absolute: 0.1 10*3/uL (ref 0.0–0.1)
Basophils Relative: 1 % (ref 0–1)
Eosinophils Absolute: 0.1 10*3/uL (ref 0.0–0.7)
Eosinophils Relative: 1 % (ref 0–5)
Lymphocytes Relative: 9 % — ABNORMAL LOW (ref 12–46)
Lymphs Abs: 1.4 10*3/uL (ref 0.7–4.0)
Monocytes Absolute: 0.7 10*3/uL (ref 0.1–1.0)
Monocytes Absolute: 0.9 10*3/uL (ref 0.1–1.0)
Neutro Abs: 7 10*3/uL (ref 1.7–7.7)
Neutrophils Relative %: 79 % — ABNORMAL HIGH (ref 43–77)

## 2010-05-21 LAB — CBC
Hemoglobin: 10.6 g/dL — ABNORMAL LOW (ref 13.0–17.0)
Hemoglobin: 10.8 g/dL — ABNORMAL LOW (ref 13.0–17.0)
Hemoglobin: 11.3 g/dL — ABNORMAL LOW (ref 13.0–17.0)
Hemoglobin: 9.7 g/dL — ABNORMAL LOW (ref 13.0–17.0)
MCHC: 34.5 g/dL (ref 30.0–36.0)
MCHC: 35 g/dL (ref 30.0–36.0)
MCHC: 35 g/dL (ref 30.0–36.0)
MCHC: 35.3 g/dL (ref 30.0–36.0)
MCHC: 35.5 g/dL (ref 30.0–36.0)
MCHC: 35.7 g/dL (ref 30.0–36.0)
MCV: 86.5 fL (ref 78.0–100.0)
MCV: 86.8 fL (ref 78.0–100.0)
MCV: 87.2 fL (ref 78.0–100.0)
MCV: 87.2 fL (ref 78.0–100.0)
MCV: 88.2 fL (ref 78.0–100.0)
Platelets: 215 10*3/uL (ref 150–400)
Platelets: 219 10*3/uL (ref 150–400)
Platelets: 392 10*3/uL (ref 150–400)
RBC: 2.83 MIL/uL — ABNORMAL LOW (ref 4.22–5.81)
RBC: 3.11 MIL/uL — ABNORMAL LOW (ref 4.22–5.81)
RBC: 3.39 MIL/uL — ABNORMAL LOW (ref 4.22–5.81)
RBC: 3.56 MIL/uL — ABNORMAL LOW (ref 4.22–5.81)
RDW: 14.2 % (ref 11.5–15.5)
RDW: 14.7 % (ref 11.5–15.5)
RDW: 14.8 % (ref 11.5–15.5)
RDW: 14.9 % (ref 11.5–15.5)
RDW: 15 % (ref 11.5–15.5)
WBC: 11.7 10*3/uL — ABNORMAL HIGH (ref 4.0–10.5)
WBC: 9.8 10*3/uL (ref 4.0–10.5)

## 2010-05-21 LAB — URINALYSIS, ROUTINE W REFLEX MICROSCOPIC
Glucose, UA: NEGATIVE mg/dL
Leukocytes, UA: NEGATIVE
Nitrite: NEGATIVE
Specific Gravity, Urine: 1.019 (ref 1.005–1.030)
pH: 6 (ref 5.0–8.0)

## 2010-05-21 LAB — POCT I-STAT, CHEM 8
BUN: 53 mg/dL — ABNORMAL HIGH (ref 6–23)
Calcium, Ion: 1.07 mmol/L — ABNORMAL LOW (ref 1.12–1.32)
HCT: 31 % — ABNORMAL LOW (ref 39.0–52.0)
TCO2: 23 mmol/L (ref 0–100)

## 2010-05-21 LAB — CROSSMATCH: Antibody Screen: NEGATIVE

## 2010-05-21 LAB — PROTIME-INR: Prothrombin Time: 14.5 seconds (ref 11.6–15.2)

## 2010-05-21 LAB — CULTURE, BLOOD (ROUTINE X 2)
Culture: NO GROWTH
Culture: NO GROWTH

## 2010-05-21 LAB — HEMOGLOBIN AND HEMATOCRIT, BLOOD
HCT: 28 % — ABNORMAL LOW (ref 39.0–52.0)
HCT: 32.6 % — ABNORMAL LOW (ref 39.0–52.0)
Hemoglobin: 10 g/dL — ABNORMAL LOW (ref 13.0–17.0)
Hemoglobin: 11.5 g/dL — ABNORMAL LOW (ref 13.0–17.0)

## 2010-05-21 LAB — CALCIUM, IONIZED: Calcium, Ion: 1.13 mmol/L (ref 1.12–1.32)

## 2010-05-21 LAB — URINE MICROSCOPIC-ADD ON

## 2010-05-21 LAB — URINE CULTURE: Special Requests: NEGATIVE

## 2010-05-21 LAB — MAGNESIUM: Magnesium: 2.8 mg/dL — ABNORMAL HIGH (ref 1.5–2.5)

## 2010-06-25 NOTE — Discharge Summary (Signed)
NAMEAHMOD, GILLESPIE          ACCOUNT NO.:  [id number]   MEDICAL RECORD NO.:  [id number]          PATIENT TYPE:  INP   LOCATION:  1437                         FACILITY:  [facility]   PHYSICIAN:  Isidor Holts, M.D.  DATE OF BIRTH:  07-Jan-1927   DATE OF ADMISSION:  06/13/2008  DATE OF DISCHARGE:  06/20/2008                               DISCHARGE SUMMARY   ADDENDUM:   PRIMARY MEDICAL DOCTOR:  Teena Irani. Arlyce Dice, M.D.   For discharge diagnoses, refer to interim summary dictated Jun 19, 2008.  Refer also to above summary for details of admission history,  procedures, consultations and detailed clinical course. For the period  however, from Jun 20, 2008 to Jun 21, 2008, the patient remained stable  and asymptomatic.  He underwent stress Myoview on Jun 20, 2008, and this  was reported as follows:  Low-risk Dobutamine Myoview with normal  perfusion, calculated ejection fraction mildly reduced at 49%, but  visually appears better.  Correlation with 2-D echocardiogram  recommended.  The patient has been reassured accordingly.  He was seen  by cardiologist on Jun 21, 2008, and cleared from cardiology viewpoint  for discharge.  However, it has been recommended that his beta blocker  be switched to once daily dosing for better compliance and convenience.   DISPOSITION:  The patient was discharged on Jun 21, 2008.   UPDATED DISCHARGE MEDICATIONS:  1. Protonix 40 mg p.o. b.i.d.  2. Xopenex HFA inhaler 2 puffs q.i.d.  3. Spiriva HandiHaler (18 mcg) 1 capsule daily.  4. Toprol XL 50 mg p.o. daily.  5. Lisinopril 5 mg p.o. daily.  6. Crestor 40 mg p.o. daily.  7. Pylera 3 capsules p.o. q.i.d. to be completed on Jun 27, 2008.  8. Tylenol Extra Strength 500 mg p.o. p.r.n. q.6 h.   Note:  The patient has been recommended to avoid Goody Powders or indeed  any other NSAIDS, in the future.   DIET:  Heart healthy.   ACTIVITY:  As tolerated, otherwise recommended to increase activity  slowly.   FOLLOW UP INSTRUCTIONS:  1. The patient is to follow up routinely with his primary MD, Dr.      Dara Hoyer.  2. He is to follow up with Dr. Stan Head, Gastroenterologist in 2      months.  He was instructed to call for an      appointment.  3. He is also to follow up with Dr. Valera Castle, [facility] on      a date to be determined.  He was instructed to call for an      appointment and appropriate information has been supplied.      Isidor Holts, M.D.  Electronically Signed     CO/MEDQ  D:  06/21/2008  T:  06/21/2008  Job:  [id number]   cc:   Teena Irani. Arlyce Dice, M.D.  Fax: [phone]   Iva Boop, MD,FACG  [facility]  [address] [place], [place] [address]   Jesse Sans. Wall, MD, FACC  [address]  [address]  [place]  [place] [address]

## 2010-06-25 NOTE — H&P (Signed)
[name], [name]          ACCOUNT NO.:  [id number]   MEDICAL RECORD NO.:  [id number]          PATIENT TYPE:  EMS   LOCATION:  ED                           FACILITY:  [facility]   PHYSICIAN:  Manus Gunning, MD      DATE OF BIRTH:  Sep 12, 1926   DATE OF ADMISSION:  06/13/2008  DATE OF DISCHARGE:                              HISTORY & PHYSICAL   CHIEF COMPLAINT:  Hematemesis and epigastric pain.   HISTORY OF PRESENT ILLNESS:  [name] is an 77 year old Caucasian  male who is non-English speaking, complete history has been provided by  his son who is at his bedside.  Apparently, he has been complaining of  epigastric pain for the past 5 days and developed hematemesis yesterday  x1 episode.  At that time, he refused to come to the hospital, but today  he had another episode of hematemesis which was larger in volume  prompting his family to bring him to the hospital.  At the initial time  of presentation, his blood pressure was 88/45, and he received 1.5  liters of normal saline and improved his blood pressure to 140/67.  Also  at the time of presentation he was in atrial fibrillation per an EKG,  but during the interview tele monitor demonstrates sinus rhythm.  Also  during my interview, he had one large bowel movement with large melenic  stools.  No bright red blood was seen at that time.  The patient himself  has been taking Goody's powders off and on over the years, but has  increased the frequency more recently secondary to a gouty attack.  The  patient himself claims that the epigastric pain is approximately 5-10,  radiates to the back, no change with ingestion of food and is  continuous.  He denies any cough, cold.  No expectoration.  No diarrhea,  no constipation.  No dysuria or polyuria.  No bright red blood per  rectum.  No hematuria.  No other musculoskeletal complaints, except for  gout in his left foot.  He denies chest pain, palpitations, PND or  orthopnea.  No  syncope, presyncope.  No tinnitus, no blurring of vision.  No loss of consciousness.  No recent falls.  No recent trauma.  There is  no history of focal neurological deficits per the family.   PAST MEDICAL/SURGICAL HISTORY:  1. Hypertension.  2. Gout.   ALLERGIES:  NO KNOWN DRUG ALLERGIES.   FAMILY HISTORY:  Per the son, he does not know.  The patient himself  claims that he does not recall.   SOCIAL HISTORY:  The patient smokes 1 pack per day for approximately 60  years now.  No history of alcohol or illicit drug use.  Lives with his  son, daughter and wife.  The son and daughter speak [place].   HOME MEDICATIONS:  1. Goody's powders p.r.n.  2. Gout medicine, unspecified.   REVIEW OF SYSTEMS:  Essentially 14-point review of systems performed.   PHYSICAL EXAMINATION:  VITAL SIGNS:  At the time of presentation,  temperature 99.1, heart rate 91, respiratory rate 28, blood pressure  88/45, increased to  140/67 with fluid bolus.  O2 saturation 100% on room  air.  GENERAL:  An elderly gentleman lying in bed uncomfortable, no apparent  distress.  HEENT:  Normocephalic, atraumatic.  Moist oral mucosa.  No thrush,  erythema or postnasal drip.  Eyes anicteric.  Extraocular muscles are  intact.  Pupils are equal and reactive to light and accommodation.  CARDIOVASCULAR:  S1-S2 normal.  Regular rate and rhythm.  Positive  systolic murmur of PR.  No rubs or gallops.  RESPIRATORY:  Air entry is bilaterally equal.  No rales, rhonchi or  wheezes appreciated.  ABDOMEN:  Soft.  Positive epigastric tenderness.  No cullen or Turner's  or flank discoloration.  Positive bowel sounds.  No organomegaly.  EXTREMITIES:  No cyanosis, no clubbing, no edema.  Positive bilateral  dorsalis pedis.  CNS:  Alert and oriented x3.  Cranial nerves II through XII are grossly  intact.  Power, sensation and reflexes bilaterally symmetrical.  HEM/ONC:  No palpable lymphadenopathy, ecchymoses, bruising or   petechiae.  SKIN:  No breakdown, swelling, ulcerations or masses.  NECK:  Supple.  Good range of motion.  No thyromegaly.  No carotid  bruits.   LABORATORY TESTS:  Sodium 140, potassium 3.8, chloride 109, BUN 53,  creatinine 1.7, glucose 116, calcium 1.07, CO2 of 23, hemoglobin 10.5,  hematocrit 31, AST 21, ALT 12, albumin 2.6, total protein 5, calcium  7.6, INR 1.1, prothrombin time 14.5, WBC count 11,700, RBC count 2.8.  Hemoglobin 8.4, hematocrit 24.3, platelet count 264, Polymorphs 81.  CT  of the abdomen demonstrates unchanged minimal dilatation of descending  abdominal aorta at 2.7 cm, negative for intra-abdominal hemorrhage or  other acute abnormality with lumbar spondylosis.  CT pelvis demonstrates  no acute finding in the pelvis.  Chest x-ray demonstrates lungs are  clear with heart of normal size.  EKG reviewed by myself demonstrates  atrial fibrillation.  Though at the time of my interview, the patient's  tele monitor demonstrated sinus rhythm at 91 beats per minute.   ASSESSMENT/PLAN:  1. Upper gastrointestinal bleed.  We will start the patient on      Protonix continuous drip with 80 mg IV bolus followed by 8 mg an      hour drip.  Type, cross and transfuse 3 units of packed red blood      cells and make patient n.p.o. for now.  For pain control use      morphine 2 mg IV q.4 h., p.r.n.  If systolic blood pressure should      go greater than 180, hydralazine 10 mg IV q.6 h.  Also, I have      consulted gastroenterology in the emergency department, they will      see the patient this evening and assess for further treatment and      therapeutic options at that time.  2. Gastrointestinal and deep venous thrombosis prophylaxis.  Protonix,      GGT and sequential compression devices.      Manus Gunning, MD  Electronically Signed     SP/MEDQ  D:  06/13/2008  T:  06/13/2008  Job:  [id number]

## 2010-06-25 NOTE — Consult Note (Signed)
NAMEMAURO, ARPS          ACCOUNT NO.:  [id number]   MEDICAL RECORD NO.:  [id number]          PATIENT TYPE:  INP   LOCATION:  0108                         FACILITY:  [facility]   PHYSICIAN:  Iva Boop, MD,FACGDATE OF BIRTH:  June 02, 1926   DATE OF CONSULTATION:  DATE OF DISCHARGE:                                 CONSULTATION   ADDENDUM:   SOCIAL HISTORY:  The patient is a smoker.  He does not use alcohol.  He  has been in the [place] 20 years.  He is married.  He has  children.      Iva Boop, MD,FACG  Electronically Signed     CEG/MEDQ  D:  06/13/2008  T:  06/13/2008  Job:  [id number]

## 2010-06-25 NOTE — Group Therapy Note (Signed)
[name], [name]          ACCOUNT NO.:  [id number]   MEDICAL RECORD NO.:  [id number]          PATIENT TYPE:  INP   LOCATION:  1437                         [facility]ACILITY:  [facility]   PHYSICIAN:  Isidor Holts, M.D.  DATE OF BIRTH:  [birth year]                                 PROGRESS NOTE   PRIMARY MEDICAL DOCTOR:  Dr. Dara Hoyer.   DISCHARGE DIAGNOSES:  1. Upper gastrointestinal bleed, secondary to gastric ulcers/non-      steroidal anti-inflammatory drug therapy.  2. Acute blood loss anemia, secondary to #1, requiring transfusion of      3 units of packed red blood cells.  3. Hypotension/shock, secondary to #1 and #2 above.  4. Hiatal hernia.  5. Helicobacter pylori positive.  6. Non-ST elevation myocardial infarction, secondary to #3.  7. Smoking history.  8. Chronic obstructive pulmonary disease.  9. History of hypertension.  10.Gout.  11.Febrile illness, query viral etiology.   DISCHARGE MEDICATIONS:  1. Protonix 40 mg p.o. b.i.d.  2. Xopenex HFA inhaler 2 puffs q.i.d.  3. Spiriva inhaler (18 mcg) 1 capsule daily.  4. Lopressor 37.5 mg p.o. t.i.d.  5. Lisinopril 5 mg p.o. daily.  6. Crestor 40 mg p.o. daily.  7. Pylera 3 capsules p.o. q.i.d. to be completed on Jun 27, 2008.  8. Tylenol Extra-Strength 500 mg p.o. p.r.n. q.6 h.   Note:  This medication list may of course be updated/modified at the  time of actual discharge, by discharging MD.   PROCEDURES:  1. Chest x-ray dated Jun 13, 2008, this showed no acute disease.  2. Abdominal/pelvic CT scan dated Jun 13, 2008, this showed unchanged      minimal dilatation of the descending abdominal aorta to 2.7 cm,      negative for intra-abdominal hemorrhage or other acute abnormality.      Lumbar spondylosis was noted.  There was no acute findings in the      pelvis.  The prostate gland is mildly prominent.  3. Chest x-ray dated Jun 14, 2008, this showed probable COPD.  No      acute findings.  4. Chest x-ray repeated  Jun 14, 2008, showed slight decrease in lung      volumes, no acute cardiopulmonary disease.  5. Chest x-ray dated Jun 15, 2008, this showed no acute      cardiopulmonary disease.  There was mild right basilar atelectasis.  6. 2-D echocardiogram dated Jun 15, 2008, this showed normal left      ventricular cavity size.  There was mild concentric hypertrophy.      Systolic function was normal.  Estimated EF was in the range of 55%-      60%.  Regional wall motion was normal.  Pulmonary artery systolic      pressure was mildly increased, peak pressure 35 mmHg.  7. Upper GI endoscopy done Jun 13, 2008, by Dr. Stan Head.  This      showed a 3-cm ulcer in the body of the stomach with nonbleeding      visible vessel, treated with EPI and gold probe, 5 mm ulcer in the  body of the stomach clean based, 5-cm hiatal hernia in the cardia.      Normal otherwise.  CLO test was positive.   CONSULTATIONS:  1. Dr. Stan Head, gastroenterologist.  2. Dr. Valera Castle, [place] cardiologist.   ADMISSION HISTORY:  As In H and P notes of Jun 13, 2008, dictated by Dr. Manus Gunning.  However, in brief, this is an 77 year old male, with known history of  hypertension, gout, smoking history, presenting with a 5-day history of  epigastric pain, culminating in hematemesis on Jun 12, 2008.  He  initially refused to come to the hospital, but on Jun 13, 2008, he had  another episode of hematemesis which was larger, prompting his family to  bring him to the hospital.  Reportedly, the patient had been taking  rather large quantities of Goody powder and some unspecified gout  medicine.  On initial evaluation in the emergency department, he was  found to have a BP of 88/45, which was aggressively addressed with  intravenous normal saline improving to 140/67 mmHg.  He was admitted for  further evaluation, investigation and management.   CLINICAL COURSE:  1. Upper GI bleed.  For details of presentation, refer  to admission      history above.  The patient's hemoglobin was found to be low at      8.4.  He was also hypotensive with a BP of 88/45, which was      addressed with intravenous saline bolus in the emergency      department.  Given his hemodynamic instability against a background      of active GI bleeding, he was transfused with 3 units of PRBCs and      transferred to the intensive care unit.  Intravenous infusion of      Protonix was commenced, and serial hemoglobin/hematocrits done.      The patient subsequently underwent upper GI endoscopy on Jun 13, 2008, by Dr. Stan Head.  For details of findings, refer to      procedure list above.  However, two gastric ulcers were noted, one      was addressed with EPI and gold probe.  Incidentally, a hiatal      hernia was noted and CLO-test proved as positive.   1. Acute blood loss anemia/acute posthemorrhagic shock.  Details are      described in #1 above.  These were readily addressed with fluid      resuscitation and blood transfusion as described above, and      endoscopic intervention.  The patient stabilized and by Jun 14, 2008, hemoglobin was reasonable at 11.3.  The patient's      hemoglobin/hematocrit have remained stable ever since.  And as a      matter of fact, hemoglobin was 10.8 with hematocrit of 31.0 on Jun 19, 2008.  No further episodes of hematemesis and/or melena were      noted throughout the rest of course of the patient's      hospitalization.  Although he continued to experience epigastric      pain for a few days, this had by Jun 19, 2008, resolved.  The      patient was subsequently transitioned to oral proton pump      inhibitor.  Diet was advanced.  As of Jun 19, 2008, he was      tolerating a regular diet.  Clearly NSAIDS  are contraindicated for      life, in this patient.  He has been informed accordingly.   1. H. pylori positive.  This was determined by the CLO testing      following EGD, and  was found to be positive.  The patient has      therefore been placed on a 10-day course of eradicative treatment      with Pylera, to be completed on Jun 27, 2008.   1. Smoking history.  The patient was found to be wheezy during the      initial part of the hospitalization.  Chest x-ray showed no acute      findings, although there was some suspicion of COPD against a      background of smoking history.  He was managed with bronchodilator      inhalers with satisfactory effect.  As of Jun 19, 2008, he was      asymptomatic from this viewpoint.   1. Smoking history.  The patient smokes up to a pack of cigarettes per      day.  He has been counseled appropriately, and was managed with      Nicoderm CQ patch during the course of this hospitalization.   1. NSTEMI.  The patient complained of chest pain in the initial part      of this hospitalization, i.e. on Jun 14, 2008.  This necessitated      doing a 12-lead EKG, which was negative for acute ischemic changes.      He was however, known to have risk factors for coronary artery      disease, including hypertension, smoking history, age and gender.      Cardiac enzymes were therefore cycled and troponin-I was found to      be significantly elevated at 4.35, consistent with NSTEMI.  Follow      up 2-D echocardiogram was unremarkable for regional wall motion      abnormalities, although the patient did have a preserved ejection      fraction of 55%-60%. Clearly, given his recent acute GI bleed,      antiplatelet therapy, as well as anticoagulant therapy was      contraindicated.  He was therefore managed with statin and beta      blockers.  A cardiology consultation was kindly provided by Dr.      Valera Castle.  For details of the consultation, refer to      consultation notes of Jun 14, 2008.  He has titrated the patient's      beta-blocker gingerly against a background of COPD and patient      fortunately, has tolerated this well.   1.  Hypertension.  The patient has a known history of hypertension.  It      is not clear whether he was on treatment prior to this      presentation.  However, as he presented in a hypotensive state,      clearly this did not appear to be an issue.  Following      stabilization of hemodynamics and cessation of GI blood loss, blood      pressures started creeping up.  This was addressed with beta-      blocker under supervision of a cardiologist, as outlined above.      However, low-dose Lisinopril was also added, resulting in      reasonable blood pressure control.  It is possible that further  titration of antihypertensive medications may be required in due      course.   1. History of gout.  This did not prove problematic during the course      of the patient's hospitalization.   1. Febrile illness.  The patient on Jun 15, 2008, spiked pyrexia of      101, necessitating carrying out septic workup, including      urinalysis, chest x-ray, blood cultures and starting the patient on      an empiric combination of intravenous Vancomycin and Zosyn.  By Jun 16, 2008, the patient was apyrexial and has since had no recurrence      of fever.  No focal source of infection was found.  Blood cultures      and urine cultures were negative.  Chest x-ray was unremarkable for      focal pathology.  Antibiotics were therefore discontinued on Jun 17, 2008, i.e. after 3 days of treatment, with no recurrence of      pyrexia.  It is likely that the patient may have had a brief viral      illness.   DISPOSITION:  This will be elucidated in detail at the appropriate time, by  discharging MD.  However, as of Jun 19, 2008, the patient was clearly  nearing discharge.  Over the past couple of days, he has had  intermittent retrosternal chest pain, attributable either to reflux or  indeed coronary artery disease.  Per cardiology recommendations,  stratification is indicated against a background of  multiple risk  factors and recent NSTEMI.  A stress Myoview has therefore been  arranged.  Unfortunately, this was not done on Jun 19, 2008, as  originally planned and most likely will be carried out on Jun 20, 2008.  Depending on the results, definitive management will be implemented.  This will depend on cardiology recommendations.  Dr. Stan Head,  gastroenterologist, has recommended follow-up in his office  following discharge and has indicated that the patient will require  repeat EGD in 2-3 months, to evaluate for interval healing of gastric  ulcers.  It is anticipated that the patient will be clinically stable  for discharge the next few days.      Isidor Holts, M.D.  Electronically Signed     CO/MEDQ  D:  06/19/2008  T:  06/19/2008  Job:  [id number]   cc:   Teena Irani. Arlyce Dice, M.D.  Fax: [phone]   Iva Boop, MD,FACG  [facility]  [address] [place], [place] [address]   Jesse Sans. Wall, MD, FACC  [address]  [address]  [place]  [place] [address]

## 2010-06-25 NOTE — Consult Note (Signed)
NAMEFERDINAND, [name]          ACCOUNT NO.:  [id number]   MEDICAL RECORD NO.:  [id number]          PATIENT TYPE:  INP   LOCATION:  0108                         FACILITY:  [facility]   PHYSICIAN:  Iva Boop, MD,FACGDATE OF BIRTH:  07-23-1926   DATE OF CONSULTATION:  06/13/2008  DATE OF DISCHARGE:                                 CONSULTATION   CHIEF COMPLAINT:  GI bleeding.   REFERRING PHYSICIAN:  Dr. Allena Katz of [facility].  The patient  is unassigned patient.   ASSESSMENT:  An 77 year old [place] man who has been using Goody's Headache  Powders for headaches who presents with a 2-day history of hematemesis  and now melena as well as hypotension.   The hypotension has been treated appropriately with IV fluids.  He has  an acute blood loss anemia in the setting of a GI bleed that I suspect  is related to end-stage ulcer disease either of the stomach or of the  duodenum.  A CT of the abdomen and pelvis has been performed and does  not demonstrate any type of retroperitoneal hemorrhage.  There is no  significant aortic aneurysm though he does have minimally dilated  descending abdominal aorta (self viewed).   PLAN AND RECOMMENDATIONS:  1. Agree with plans to transfuse 3 units of packed red cells.  2. Urgent upper endoscopy tonight to diagnose and treat the source of      bleeding.  Risks, benefits and indications have been discussed with      the patient's family.  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  3. Further plans pending clinical course, IV proton pump infusion is      appropriate.   HISTORY:  This 77 year old Asian man was in usual state of health.  He  has some chronic abdominal pain problems.  For years he has had  bilateral lower quadrant pain, his daughter tells me.  Yesterday he  vomited some bright red blood.  He was not complaining of any new pain.  They decided to observe for recurrent bleeding.  Today, he had more  hematemesis, a larger amount and since arriving at the hospital he has  had melena.  History is taken from the patient's family, records and  from discussion with the internist, Dr. Allena Katz.   His blood pressure was in the 70s here systolic, he was given IV fluid  boluses and that has improved to a blood pressure of 140/67.  He has  some mild abdominal discomfort at this time.  He has vomited bright red  blood 2 if not 3 times total.  There has been one melenic stool.  He has  not had problems like this before.  He has been eating well without  losing weight.  His bowel movements have been fairly regular without any  rectal bleeding or melena prior to this time.  He is unable to really  give any other history.  I do not think there are any other active  problems at this time.  He has intermittent headaches for which he takes  Goody's.  It  is hard for me to tell from talking to the patient's  family, how much of those he was using, but it does sound like he has  been using those on a regular basis and there may be a gout medicine as  well.   MEDICATIONS:  As above, Goody's and a gout medication.   DRUG ALLERGIES:  None known.   PAST MEDICAL HISTORY:  The patient was admitted to the hospital in 2006  and records showed that he had abdominal pain at that time with nausea  and vomiting and dizziness.  It looks to me like it was self-limited  sort of thing.  He had an unrevealing CT abdomen and pelvis at that  time.  Felt his abdominal pain was certainly chronic.  Past medical  history is also notable for hypertension for which he does not take any  medications.  He does not see a doctor regularly.   SOCIAL HISTORY:  He is here with his wife, who does not speak [place].  His daughter is here is well and, I think, another daughter.  She speaks  [place] well and serves as Nurse, learning disability.   FAMILY HISTORY:  Noncontributory.   REVIEW OF SYSTEMS:  As per HPI, I obtained as best I could through  the  daughter and all other systems are negative as best I can tell.   PHYSICAL EXAM:  CONSTITUTIONAL:  Reveals an acutely and chronically ill  elderly Asian man lying in bed.  VITAL SIGNS:  Blood pressure 140/67, pulse 97, respirations 18.  His  oxygen saturation was 100% on room air, temperature 99.1.  Initial blood  pressure was 88/45, pulse 91.  EYES:  Show bilateral arcus.  Otherwise anicteric.  MOUTH:  Edentulous and slightly dry.  No lesions in the mouth, tongue or  pharynx I can see.  NECK:  The neck is without mass.  LUNGS:  Show coarse breath sounds and fair air movement with some  wheezing.  HEART:  Distant S1, S2.  There is systolic murmur faintly heard.  ABDOMEN:  Soft, nontender without organomegaly or mass.  He has some  mild lower quadrant tenderness bilaterally.  Bowel sounds are increased.  RECTAL:  I have not performed a rectal.  The patient has had melena.  LOWER EXTREMITIES:  Free of edema.  There are some fungal changes in the  toenails.  The distal pulses in the feet are intact, i.e. DP  bilaterally.  Areas of the trunk show tattoos on the upper chest.  He is  unable to speak [place].  He moves all 4 extremities well.  The skin is  otherwise warm and dry without obvious rash.  LYMPHATICS:  No cervical nodes detected.   LABORATORY DATA:  His white count 11.7, hemoglobin 8.4, hematocrit 24,  MCV is 85, platelets 264,000.  Pro time, INR and PTT are normal with 1.1  and 26 respectively.  His BMET shows a BUN of 51, creatinine 1.58.  His  albumin is 2.6, total protein 5, calcium 7.6, glucose 128.  Other  electrolytes are normal.  His initial hemoglobin on the E-STAT was 10.5  at 1856 hours.  The blood was drawn 1808 hours.   CT abdomen and pelvis as above.  I have self-viewed that, I have looked  at the old records, spoken to the family for the history, and read the  radiology reports as well and the lab reports.   I appreciate the opportunity to care for this  patient.  Iva Boop, MD,FACG  Electronically Signed     CEG/MEDQ  D:  06/13/2008  T:  06/13/2008  Job:  [id number]

## 2010-06-25 NOTE — Consult Note (Signed)
NAMEEMILIANO, [name]          ACCOUNT NO.:  [id number]   MEDICAL RECORD NO.:  [id number]          PATIENT TYPE:  INP   LOCATION:  1229                         FACILITY:  [name]   PHYSICIAN:  Thomas C. Wall, MD, FACCDATE OF BIRTH:  [birth year]   DATE OF CONSULTATION:  06/14/2008  DATE OF DISCHARGE:                                 CONSULTATION   REFERRING PHYSICIAN:  Dr. Isidor Holts   I was asked by Dr. Isidor Holts of the [facility] to  consult on this patient with substernal chest pain early this afternoon  with positive troponin of 4.35.   He has no previous cardiac history.  He is 77 years old, has a history  of hypertension and smokes heavily.   He presented with a 2-day history of hematemesis and subsequent melena  with hypovolemic or hemorrhagic shock.  His blood pressure was as low as  70s in the ED.  He responded to IV fluids.   This afternoon around 1:30, he complained of substernal chest pain.  An  electrocardiogram shows an axis shift with early Q's inferiorly.  There  is no ST-segment elevation, however.  There is not early R-wave  progression in the anterior precordium.  Troponin spot level was 4.35.  His CPK was 219, MB of 18.2, index of 8.3.  His hemoglobin is currently  11.5 and stable.  His electrolytes are stable.  His blood pressure is,  if anything, a little high.  His heart rate is in the 80s and sinus.   Of note, his presentation ECG showed some atrial fibrillation.   ALLERGIES:  He has no known drug allergies.   MEDICATIONS ON ADMISSION:  Goody's powder and a gout medication.  He  would take a Goody's after having chest pain routinely.   PAST MEDICAL HISTORY:  He was admitted to the hospital in 2006, and  records show that he had some abdominal pain at that time with nausea,  vomiting, and dizziness.  He had unrevealing CT of the abdomen and  pelvis at that time.  Diagnosis was not easily made.   SOCIAL HISTORY:  He is here  with his wife who does not speak [place].  Daughter is here, and she speaks a little bit of [place].  He does not  drink alcohol.  He has lived in the [place] for 20 years.  He is  married and has children.  He does smoke about a pack of cigarettes a  day.   FAMILY HISTORY:  Noncontributory.   REVIEW OF SYSTEMS:  I have reviewed the history and physical as well as  consultations in the chart, and there is nothing new to add.  The  patient really cannot give any history because of language barrier.   PHYSICAL EXAMINATION:  VITAL SIGNS:  His blood pressure is currently  152/84, pulse 89 and regular.  His rhythm is sinus by telemetry.  Saturations 97% on 2 L.  He is on intravenous pantoprazole drip.  GENERAL:  He is in no acute distress.  He is elderly.  HEENT:  Show sclerae to be injected with pink conjunctivae.  He is  edentulous.  NECK:  Supple.  It is very difficult to hear any bruits because of  respiratory rhonchi.  LUNGS:  Reveal inspiratory/expiratory rhonchi.  HEART:  Reveals a nondisplaced PMI; there is normal S1-S2.  No obvious  murmur or gallop.  ABDOMINAL EXAM:  Soft, good bowel sounds.  No obvious tenderness, though  I did not deeply palpate with his history and presentation.  EXTREMITIES:  Reveal no edema.  Compression devices in place.  Pulses  were 2+/4+, bilaterally symmetrical.  NEUROLOGIC:  Grossly intact.   ASSESSMENT:  1. Non-ST segment elevation myocardial infarction that was showing      some early Q's and some axis shift in the inferior leads.  Based on      his history, he may have had this infarct several days prior to      admission.  It could have happened from demand ischemia from his      acute hemorrhagic shock from his gastrointestinal bleed.  2. Tobacco use.  3. Hypertension.   RECOMMENDATIONS:  1. Agree with low-dose beta-blocker, though he does have some      significant rhonchi.  Will have to watch his respiratory status.  2. IV  nitroglycerin at 3 drops to decrease systolic pressure and also      to decrease any further coronary ischemia at the present time.  3.      Keep hemoglobin stable.  3. A 2-D echocardiogram to assess amount of LV damage.  4. Serial enzymes to continue to quantitate size and timing of      infarct.  5. No aspirin, Plavix, heparin.  This is contraindicated with his GI      bleed.   Thank you very much for the consultation.      Thomas C. Daleen Squibb, MD, [facility]  Electronically Signed     TCW/MEDQ  D:  06/14/2008  T:  06/14/2008  Job:  [id number]

## 2010-06-28 NOTE — Consult Note (Signed)
[name], [name] NO.:  [id number]   MEDICAL RECORD NO.:  [id number]          PATIENT TYPE:  INP   LOCATION:  1823                         FACILITY:  [facility]   PHYSICIAN:  Graylin Shiver, M.D.   DATE OF BIRTH:  [birth year]   DATE OF CONSULTATION:  DATE OF DISCHARGE:                                   CONSULTATION   REASON FOR CONSULTATION:  This patient is a 77 year old male who does not  speak [place].  He is from [place].  He is being admitted to the hospital  because of nausea and vomiting which began last evening.  He denied  hematemesis.  The interpreter for Dr. Roxan Hockey was the patient's daughter.  The interpreter for me is his son.  The daughter is no longer here. The  patient was feeling well until he started experiencing the nausea and  vomiting.   The reason for the GI consult is that the patient has been experiencing  chronic epigastric abdominal pain, which he has had off and on for  approximately 30 years, and also suprapubic pain, which he has had also for  years on an intermittent basis.  The son states that the pains are described  like gas pains.  The epigastric pain has been relieved by Maalox in the  past.  The patient apparently has seen doctors over the years for these  pains and according to them, nothing specifically has been found to explain  the pain.  I do not know, however, was specific evaluation has been done.  The son states he has had some tests, done but they are unable to describe  what has been done.  In looking at the E-chart, I do not see any specific  tests that have been done.   PAST HISTORY:  Medical problems:  Hypertension, possible benign prostatic  hypertrophy, gout, dizziness off and on since age 51.   PRIOR SURGERIES:  None.   ALLERGIES:  None known.   MEDICATIONS PRIOR TO ADMISSION:  Something for hypertension.   SOCIAL HISTORY:  He smokes.  HE used to drink alcohol but does not any more.   PHYSICAL  EXAMINATION:  VITAL SIGNS  His vital signs were stable.  GENERAL:  He does not appear in any acute distress. He is nonicteric.  NECK:  supple.  HEART:  Regular rhythm.  No murmurs.  CHEST:  Lungs were clear.  ABDOMEN:  Bowel sounds were normal, soft, nontender, no hepatosplenomegaly.   IMPRESSION:  1.  acute onset of nausea and vomiting.  This may be secondary to a      gastroenteritis.  2.  Chronic epigastric pain, etiology not clear.  3.  Chronic suprapubic pain, etiology unclear.   The patient has had a CT of the abdomen at this time with no acute findings.   PLAN:  At this time the patient is being admitted to the hospital for IV  hydration and observation.  The CT of the abdomen and pelvis did not show  any acute findings.  He currently appears in no acute distress.  Cardiac enzymes are being obtained over the next 24  hours per orders from  Dr. Roxan Hockey.   As far as further GI workup for the chronic epigastric pain and chronic  suprapubic pain, this perhaps can be done as an outpatient.  I will reassess  the patient tomorrow, check on his cardiac enzymes, and if this turns out to  be a gastroenteritis and he is able to go home, he can follow up as an  outpatient for further evaluation of the chronic epigastric and suprapubic  pain.      SFG/MEDQ  D:  05/20/2004  T:  05/20/2004  Job:  [id number]   cc:   Teena Irani. Arlyce Dice, M.D.  [address]  [place]  [place] [address]  Fax: [phone]   Michaelyn Barter, M.D.

## 2010-06-28 NOTE — H&P (Signed)
NAMECHARBEL, LOS NO.:  [id number]   MEDICAL RECORD NO.:  [id number]          PATIENT TYPE:  EMS   LOCATION:  MAJO                         FACILITY:  [facility]   PHYSICIAN:  Michaelyn Barter, M.D. DATE OF BIRTH:  30-Apr-1926   DATE OF ADMISSION:  05/19/2004  DATE OF DISCHARGE:                                HISTORY & PHYSICAL   PRIMARY CARE PHYSICIAN:  Teena Irani. Arlyce Dice, M.D.   CHIEF COMPLAINT:  Dizziness, nausea, emesis, and abdominal pain.   HISTORY OF PRESENT ILLNESS:  Mr. Knickerbocker is a 77 year old [place] gentleman  who does not speak any [place].  Therefore, the history comes from his  daughter, Duaine Dredge.  She states that last night her father went to  take a bath.  After bathing himself and getting out the tub, shortly  afterwards, he started to feel ill.  He had been doing well throughout the  entire day prior to this, however.  He developed some dizziness, and the  dizziness progressed to the point whereby he felt unsteady on his feet and  decided to lie down.  Shortly afterwards, he started having emesis, and,  according to the daughter, he has had approximately six to seven episodes of  emesis since his symptoms first started.  Likewise, the patient complained  of some tightness in the upper epigastric region and some suprapubic  tenderness.  The patient describes his upper epigastric tenderness as  feeling like indigestion, and he equated it with having a lot of food in his  abdomen.  That is, his abdomen felt as though it was filled with food.  According to the daughter, she states that the patient has had this  epigastric tightness for at least 30 years, on and off.  She states he has  seen multiple physicians regarding this, but no one can find anything.  He  has taken Maalox to help relieve his symptoms.  Likewise, the patient's  suprapubic discomfort has been present for at least eight to nine years.  In  the past, when the patient has had  suprapubic pain, he also experienced some  testicular swelling, but that was treated by doctors in the past also.  In  addition, the patient's dizziness  has occurred off and on for at least 50  years.  There are no complaints of fevers or chills.  The patient states  that occasionally fluid will aggravate his stomach tightness and sometimes  it relieves spontaneously.  There are no other complaints.   PAST MEDICAL HISTORY:  1.  Hypertension.  2.  Questionable BPH.  3.  Gout.  4.  History of dizziness that has been present on and off since the age of      77.   PAST SURGICAL HISTORY:  None.   ALLERGIES:  None.   HOME MEDICATIONS:  The patient takes medications for his hypertension.  However, he does not remember the names.  I will make an attempt to find out  the names of his medications.   FAMILY HISTORY:  Mother:  No illnesses.  Father:  No illnesses.   SOCIAL HISTORY:  Cigarettes:  The patient smokes almost one pack per day and  has been doing so since the age of 36.  Alcohol:  Started drinking as a  teenager.  He drank heavily for some years, but then stopped drinking  approximately 40 years ago.  Now, he drinks only occasionally.   REVIEW OF SYSTEMS:  As per HPI.  Otherwise, all other systems are negative.   PHYSICAL EXAMINATION:  GENERAL: The patient appears to be sleepy.  He is lying on his right side  because lying on his back causes him to feel dizzy.  VITAL SIGNS: Temperature 96.9, blood pressure 159/95, heart rate 79,  respirations 22.  O2 saturation 96%.  HEENT: Anicteric.  Decreased pupil response to light.  Uvula is pink, dry.  No exudate.  No thrush.  Tympanic membranes are gray and intact.  NECK: Supple.  No lymphadenopathy.  Thyroid is not palpable.  Good bilateral  carotid upstroke.  No bruits.  CARDIAC:  S1, S2 present.  Regular rate and rhythm.  No murmurs.  No  gallops.  No rubs.  PMI is nondisplaced.  No parasternal heave.  RESPIRATORY: Occasional  wheeze on the left side.  Otherwise, lungs are  clear.  ABDOMEN: Soft.  Some left lower quadrant tenderness to deep palpation.  No  rebound.  No guarding.  Positive bowel sounds.  No hepatosplenomegaly.  EXTREMITIES: No edema.  MUSCULOSKELETAL: 5/5 upper and lower extremity strength.  NEUROLOGICAL: Cranial nerves II-XII are intact.  There is no facial  asymmetry.  Good hand grip.  Good finger-to-nose coordination.  No motor  deficit.  GENITOURINARY: Testicles appear to be normal in size.  There is no obvious  swelling.  There is no tenderness to palpation of the epididymis.   LABORATORY DATA:  Hemoglobin 13.6, hematocrit 38.4, white blood cells 12.5,  platelets 292.  Sodium 144, potassium 2.9, bilirubin total 0.8, alkaline  phosphatase 66, SGOT 36, SGPT 22, total protein 6.7, albumin 3.6, calcium  8.8. LDH 144, amylase 417.  CT scan of abdomen and pelvis was interpreted by  the radiologist as lower lumbar spondylosis.  Small abdominal aortic  aneurysm, 2.7 cm.  No acute findings.  EKG reveals normal sinus rhythm with  the rate in the 60s, with poor R wave progression, but no ST or T wave  changes.   ASSESSMENT AND PLAN:  Mr. Clear is a 77 year old gentleman with  acute/chronic dizziness and acute/chronic upper abdominal pain.   1.  Acute/chronic dizziness.  The etiology of this particular term is      questionable.  We will order a CT scan of the head to rule out any acute      processes.  We will consider MRI if the CAT scan is negative.  We will      provide Antivert 25 mg p.o. q.6h.  We will also check orthostatics and      cardiac markers, although the patient does not complain of any cardiac      symptoms at this particular time.  2.  Acute/chronic abdominal pain.  The etiology also is questionable.      However, given the negative CT scan, the differential includes benign     conditions such as gastritis versus peptic ulcer disease.  Therefore, we      will start the  patient on Protonix 40 mg p.o. daily for now.  We will      also consider consulting GI.  The patient may need an EGD, either during  this hospitalization or an outpatient.  3.  Intractable nausea and emesis.  This may be secondary to either #1 or #2      above.  We will continue antiemetics, Phenergan for now.  We will      provide aggressive IV fluid hydration.  4.  Hypokalemia.  We will supplement the patient's potassium.  5.  Deep venous thrombosis prophylaxis.  We will provide either heparin or      Lovenox, pending the results of the patient's head CT and/or MR.      OR/MEDQ  D:  05/20/2004  T:  05/20/2004  Job:  [id number]

## 2012-09-15 ENCOUNTER — Other Ambulatory Visit: Payer: Self-pay | Admitting: Family Medicine

## 2012-09-15 DIAGNOSIS — R109 Unspecified abdominal pain: Secondary | ICD-10-CM

## 2012-09-17 ENCOUNTER — Ambulatory Visit
Admission: RE | Admit: 2012-09-17 | Discharge: 2012-09-17 | Disposition: A | Discharge status: Home / Self Care | Payer: PRIVATE HEALTH INSURANCE | Source: Ambulatory Visit | Attending: Family Medicine | Admitting: Family Medicine

## 2012-09-17 DIAGNOSIS — R109 Unspecified abdominal pain: Secondary | ICD-10-CM

## 2012-11-08 ENCOUNTER — Encounter (HOSPITAL_COMMUNITY): Payer: Self-pay | Admitting: Emergency Medicine

## 2012-11-08 ENCOUNTER — Emergency Department (HOSPITAL_COMMUNITY): Payer: PRIVATE HEALTH INSURANCE

## 2012-11-08 ENCOUNTER — Inpatient Hospital Stay (HOSPITAL_COMMUNITY): Payer: PRIVATE HEALTH INSURANCE

## 2012-11-08 ENCOUNTER — Observation Stay (HOSPITAL_COMMUNITY)
Admission: EM | Admit: 2012-11-08 | Discharge: 2012-11-08 | Disposition: A | Discharge status: Home / Self Care | Payer: PRIVATE HEALTH INSURANCE | Attending: Internal Medicine | Admitting: Internal Medicine

## 2012-11-08 DIAGNOSIS — I10 Essential (primary) hypertension: Secondary | ICD-10-CM

## 2012-11-08 DIAGNOSIS — M109 Gout, unspecified: Secondary | ICD-10-CM | POA: Insufficient documentation

## 2012-11-08 DIAGNOSIS — R112 Nausea with vomiting, unspecified: Secondary | ICD-10-CM

## 2012-11-08 DIAGNOSIS — R1013 Epigastric pain: Secondary | ICD-10-CM | POA: Insufficient documentation

## 2012-11-08 DIAGNOSIS — R6883 Chills (without fever): Secondary | ICD-10-CM | POA: Insufficient documentation

## 2012-11-08 DIAGNOSIS — K279 Peptic ulcer, site unspecified, unspecified as acute or chronic, without hemorrhage or perforation: Secondary | ICD-10-CM

## 2012-11-08 DIAGNOSIS — A048 Other specified bacterial intestinal infections: Secondary | ICD-10-CM

## 2012-11-08 DIAGNOSIS — K250 Acute gastric ulcer with hemorrhage: Secondary | ICD-10-CM

## 2012-11-08 DIAGNOSIS — E871 Hypo-osmolality and hyponatremia: Secondary | ICD-10-CM

## 2012-11-08 DIAGNOSIS — D62 Acute posthemorrhagic anemia: Secondary | ICD-10-CM

## 2012-11-08 DIAGNOSIS — E860 Dehydration: Secondary | ICD-10-CM

## 2012-11-08 DIAGNOSIS — N179 Acute kidney failure, unspecified: Secondary | ICD-10-CM

## 2012-11-08 DIAGNOSIS — K219 Gastro-esophageal reflux disease without esophagitis: Secondary | ICD-10-CM | POA: Insufficient documentation

## 2012-11-08 DIAGNOSIS — R062 Wheezing: Secondary | ICD-10-CM | POA: Insufficient documentation

## 2012-11-08 DIAGNOSIS — R42 Dizziness and giddiness: Secondary | ICD-10-CM | POA: Insufficient documentation

## 2012-11-08 DIAGNOSIS — R52 Pain, unspecified: Principal | ICD-10-CM | POA: Insufficient documentation

## 2012-11-08 HISTORY — DX: Essential (primary) hypertension: I10

## 2012-11-08 HISTORY — DX: Acute kidney failure, unspecified: N17.9

## 2012-11-08 HISTORY — DX: Gout, unspecified: M10.9

## 2012-11-08 HISTORY — DX: Unspecified hearing loss, unspecified ear: H91.90

## 2012-11-08 LAB — URINALYSIS, ROUTINE W REFLEX MICROSCOPIC
Bilirubin Urine: NEGATIVE
Glucose, UA: NEGATIVE mg/dL
Ketones, ur: NEGATIVE mg/dL
pH: 7 (ref 5.0–8.0)

## 2012-11-08 LAB — COMPREHENSIVE METABOLIC PANEL
ALT: 14 U/L (ref 0–53)
AST: 23 U/L (ref 0–37)
Albumin: 3.3 g/dL — ABNORMAL LOW (ref 3.5–5.2)
Alkaline Phosphatase: 94 U/L (ref 39–117)
BUN: 17 mg/dL (ref 6–23)
Chloride: 94 mEq/L — ABNORMAL LOW (ref 96–112)
Potassium: 3.7 mEq/L (ref 3.5–5.1)
Total Bilirubin: 0.6 mg/dL (ref 0.3–1.2)

## 2012-11-08 LAB — CBC WITH DIFFERENTIAL/PLATELET
Basophils Relative: 0 % (ref 0–1)
Hemoglobin: 11.5 g/dL — ABNORMAL LOW (ref 13.0–17.0)
MCHC: 35 g/dL (ref 30.0–36.0)
Monocytes Relative: 7 % (ref 3–12)
Neutro Abs: 9.4 10*3/uL — ABNORMAL HIGH (ref 1.7–7.7)
Neutrophils Relative %: 85 % — ABNORMAL HIGH (ref 43–77)
RBC: 3.97 MIL/uL — ABNORMAL LOW (ref 4.22–5.81)

## 2012-11-08 LAB — URINE MICROSCOPIC-ADD ON

## 2012-11-08 LAB — LIPASE, BLOOD: Lipase: 25 U/L (ref 11–59)

## 2012-11-08 LAB — POCT I-STAT TROPONIN I

## 2012-11-08 MED ORDER — ONDANSETRON HCL 4 MG/2ML IJ SOLN
4.0000 mg | Freq: Once | INTRAMUSCULAR | Status: AC
  Administered 2012-11-08: 4 mg via INTRAVENOUS
  Filled 2012-11-08: qty 2

## 2012-11-08 MED ORDER — METHYLPREDNISOLONE SODIUM SUCC 125 MG IJ SOLR
125.0000 mg | Freq: Once | INTRAMUSCULAR | Status: AC
  Administered 2012-11-08: 125 mg via INTRAVENOUS
  Filled 2012-11-08: qty 2

## 2012-11-08 MED ORDER — COLCHICINE 0.6 MG PO TABS: 0.6000 mg | ORAL_TABLET | Freq: Two times a day (BID) | ORAL | Status: DC

## 2012-11-08 MED ORDER — ONDANSETRON HCL 4 MG PO TABS: 4.0000 mg | ORAL_TABLET | Freq: Three times a day (TID) | ORAL | Status: DC | PRN

## 2012-11-08 MED ORDER — SODIUM CHLORIDE 0.9 % IV SOLN: INTRAVENOUS | Status: DC

## 2012-11-08 MED ORDER — ONDANSETRON HCL 4 MG PO TABS: 4.0000 mg | ORAL_TABLET | Freq: Four times a day (QID) | ORAL | Status: DC | PRN

## 2012-11-08 MED ORDER — GI COCKTAIL ~~LOC~~
30.0000 mL | Freq: Once | ORAL | Status: AC
  Administered 2012-11-08: 30 mL via ORAL
  Filled 2012-11-08: qty 30

## 2012-11-08 MED ORDER — PANTOPRAZOLE SODIUM 40 MG IV SOLR
40.0000 mg | Freq: Once | INTRAVENOUS | Status: AC
  Administered 2012-11-08: 40 mg via INTRAVENOUS
  Filled 2012-11-08: qty 40

## 2012-11-08 MED ORDER — ONDANSETRON HCL 4 MG/2ML IJ SOLN: 4.0000 mg | Freq: Four times a day (QID) | INTRAMUSCULAR | Status: DC | PRN

## 2012-11-08 MED ORDER — METHYLPREDNISOLONE SODIUM SUCC 125 MG IJ SOLR
60.0000 mg | INTRAMUSCULAR | Status: DC
  Administered 2012-11-08: 09:00:00 60 mg via INTRAVENOUS
  Filled 2012-11-08 (×2): qty 0.96

## 2012-11-08 MED ORDER — HYDROMORPHONE HCL PF 1 MG/ML IJ SOLN: 1.0000 mg | INTRAMUSCULAR | Status: DC | PRN

## 2012-11-08 MED ORDER — ALBUTEROL SULFATE (5 MG/ML) 0.5% IN NEBU
5.0000 mg | INHALATION_SOLUTION | Freq: Once | RESPIRATORY_TRACT | Status: AC
  Administered 2012-11-08: 5 mg via RESPIRATORY_TRACT
  Filled 2012-11-08: qty 1

## 2012-11-08 MED ORDER — SODIUM CHLORIDE 0.9 % IJ SOLN: 3.0000 mL | Freq: Two times a day (BID) | INTRAMUSCULAR | Status: DC

## 2012-11-08 MED ORDER — FENTANYL CITRATE 0.05 MG/ML IJ SOLN
50.0000 ug | INTRAMUSCULAR | Status: DC | PRN
  Administered 2012-11-08: 50 ug via INTRAVENOUS
  Filled 2012-11-08: qty 2

## 2012-11-08 MED ORDER — SODIUM CHLORIDE 0.9 % IV SOLN
INTRAVENOUS | Status: DC
  Administered 2012-11-08: 125 mL/h via INTRAVENOUS

## 2012-11-08 MED ORDER — ALUM & MAG HYDROXIDE-SIMETH 200-200-20 MG/5ML PO SUSP: 30.0000 mL | Freq: Four times a day (QID) | ORAL | Status: DC | PRN

## 2012-11-08 MED ORDER — PANTOPRAZOLE SODIUM 40 MG IV SOLR
40.0000 mg | Freq: Two times a day (BID) | INTRAVENOUS | Status: DC
  Administered 2012-11-08: 10:00:00 40 mg via INTRAVENOUS
  Filled 2012-11-08 (×3): qty 40

## 2012-11-08 NOTE — ED Notes (Signed)
Family at bedside. 

## 2012-11-08 NOTE — ED Provider Notes (Signed)
CSN: [id number]     Arrival date & time 11/08/12  0327 History   First MD Initiated Contact with Patient 11/08/12 [phone]     Chief Complaint  Patient presents with  . Generalized Body Aches   (Consider location/radiation/quality/duration/timing/severity/associated sxs/prior Treatment) HPI History provided by daughter and patient. Daughter translates. Patient is from [place]. He is followed locally by Dr. Benedetto Goad for hypertension, GERD and gout.  He has been feeling sick for the last 3 days with dizziness room spinning, vomiting unable to hold anything down and is now generalized body aches, hurts all over especially in his hands and feet where he is prone to gout. He complains of body chills but no fevers. He also has some wheezing. He denies any cough. He was a previous smoker. No chest pain. Some epigastric pain/ feels like his typical reflux symptoms. No radiating pain. Taking tramadol and Tylenol at home with out relief.  Past Medical History  Diagnosis Date  . Hypertension   . Gout   . Hard of hearing    No past surgical history on file. No family history on file. History  Substance Use Topics  . Smoking status: Never Smoker   . Smokeless tobacco: Not on file  . Alcohol Use: No    Review of Systems  Constitutional: Positive for chills. Negative for fever.  HENT: Negative for neck pain and neck stiffness.   Eyes: Negative for visual disturbance.  Respiratory: Positive for wheezing.   Cardiovascular: Negative for chest pain.  Gastrointestinal: Negative for diarrhea and blood in stool.  Genitourinary: Negative for dysuria.  Musculoskeletal: Negative for back pain.  Skin: Negative for rash.  Neurological: Negative for weakness, numbness and headaches.  All other systems reviewed and are negative.    Allergies  Review of patient's allergies indicates no known allergies.  Home Medications  No current outpatient prescriptions on file. BP 136/91  Pulse 99  Temp(Src)  98.5 F (36.9 C) (Oral)  Resp 18  SpO2 98% Physical Exam  Constitutional: He is oriented to person, place, and time. He appears well-developed and well-nourished.  HENT:  Head: Normocephalic and atraumatic.  Eyes: EOM are normal. Pupils are equal, round, and reactive to light.  Neck: Neck supple.  Cardiovascular: Regular rhythm and intact distal pulses.   Pulmonary/Chest: He exhibits no tenderness.  Mild tachypnea with bilateral expiratory wheezes and decreased breath sounds  Abdominal: Soft. He exhibits no distension. There is no tenderness.  Musculoskeletal: Normal range of motion.  Right wrist with mild erythema, swelling and increased warmth to touch. There is tenderness with distal neurovascular. No other areas of significant tenderness or increased warmth to touch.  Neurological: He is alert and oriented to person, place, and time.  Skin: Skin is warm and dry.    ED Course  Procedures (including critical care time) Labs Review Labs Reviewed  CBC WITH DIFFERENTIAL - Abnormal; Notable for the following:    WBC 11.0 (*)    RBC 3.97 (*)    Hemoglobin 11.5 (*)    HCT 32.9 (*)    RDW 15.6 (*)    Neutrophils Relative % 85 (*)    Neutro Abs 9.4 (*)    Lymphocytes Relative 7 (*)    All other components within normal limits  COMPREHENSIVE METABOLIC PANEL - Abnormal; Notable for the following:    Sodium 129 (*)    Chloride 94 (*)    Glucose, Bld 136 (*)    Creatinine, Ser 1.60 (*)    Albumin  3.3 (*)    GFR calc non Af Amer 38 (*)    GFR calc Af Amer 44 (*)    All other components within normal limits  LIPASE, BLOOD  LACTIC ACID, PLASMA  URINALYSIS, ROUTINE W REFLEX MICROSCOPIC  POCT I-STAT TROPONIN I   Imaging Review Dg Chest 2 View  11/08/2012   CLINICAL DATA:  Body aches.  EXAM: CHEST  2 VIEW  COMPARISON:  03/29/2010  FINDINGS: Heart is borderline enlarged. Lungs are clear. No effusions or edema. No acute bony abnormality. Degenerative changes in the thoracic spine.   IMPRESSION: No active cardiopulmonary disease.   Electronically Signed   By: Charlett Nose M.D.   On: 11/08/2012 04:49   Ct Head Wo Contrast  11/08/2012   *RADIOLOGY REPORT*  Clinical Data: Dizziness, emesis  CT HEAD WITHOUT CONTRAST  Technique:  Contiguous axial images were obtained from the base of the skull through the vertex without contrast.  Comparison: Prior CT from 03/25/2010  Findings: Diffuse prominence of the CSF containing spaces is not significantly changed as compared to prior examination, compatible with generalized age related atrophy.  Moderate microvascular ischemic disease is similar.  There is no acute intracranial hemorrhage or infarct.  Small punctate calcification overlies the cortical sulci in the left temporal lobe.  No mass or midline shift.  No extra-axial fluid collection.  Calvarium is intact.  The globes are normal.  Paranasal sinuses are clear.  The left mastoid air cells are clear.  There is partial opacification of the right mastoid air cells with increased sclerosis of the right temporal bone.  IMPRESSION:     1.    No acute intracranial process.        2.    Atrophy with chronic microvascular ischemic changes, similar to prior study. 3.    Chronic right mastoid effusion.  Original Report Authenticated By: Rise Mu, M.D.     Date: 11/08/2012  Rate: 89  Rhythm: normal sinus rhythm  QRS Axis: normal  Intervals: PR prolonged and QT prolonged  ST/T Wave abnormalities: nonspecific ST changes  Conduction Disutrbances:nonspecific intraventricular conduction delay  Narrative Interpretation: Sinus with PACs  Old EKG Reviewed: none available  IV fluids. IV fentanyl/Zofran for pain IV steroids and albuterol neb for wheezing  5:42 AM on recheck has persistent nausea with wheezes resolved.  Medicine consult, Dr. Onalee Hua evaluated bedside and will admit  MDM  Diagnosis: Persistent nausea vomiting, dizziness, hyponatremia EKG, labs, imaging IV fluids and  medications  Sunnie Nielsen, MD 11/08/12 [phone]

## 2012-11-08 NOTE — H&P (Signed)
Chief Complaint:  N/v abd pain  HPI: 79 yo male h/o htn, gout, pud comes in after 3 days of n/v nonbloody worse after eating with severe epigastric abd pain.  Pt has h/o PUD years ago, has been on nexium for years with no recent interuption in his nexium tx.  No diarrhea.  No fevers.  Has not been eating or drinking well.  No sob.  No wt loss.  No use of goody powder or bc powder otc meds.  Review of Systems:  Positive and negative as per HPI otherwise all other systems are negative  Past Medical History: Past Medical History  Diagnosis Date  . Hypertension   . Gout   . Hard of hearing     Medications: Prior to Admission medications   Medication Sig Start Date End Date Taking? Authorizing Provider  allopurinol (ZYLOPRIM) 300 MG tablet Take 300 mg by mouth daily.   Yes Historical Provider, MD  esomeprazole (NEXIUM) 40 MG capsule Take 40 mg by mouth 2 (two) times daily before a meal.   Yes Historical Provider, MD  tamsulosin (FLOMAX) 0.4 MG CAPS capsule Take 0.4 mg by mouth daily.   Yes Historical Provider, MD  traMADol (ULTRAM) 50 MG tablet Take 50 mg by mouth every 6 (six) hours as needed for pain.   Yes Historical Provider, MD    Allergies:  No Known Allergies  Social History:  reports that he has never smoked. He does not have any smokeless tobacco history on file. He reports that he does not drink alcohol or use illicit drugs.  Family History: none  Physical Exam: Filed Vitals:   11/08/12 0334 11/08/12 0602  BP: 136/91 124/95  Pulse: 99 82  Temp: 98.5 F (36.9 C) 98.7 F (37.1 C)  TempSrc: Oral Oral  Resp: 18 16  SpO2: 98% 98%   General appearance: alert, cooperative and no distress minimal english Head: Normocephalic, without obvious abnormality, atraumatic Eyes: negative Nose: Nares normal. Septum midline. Mucosa normal. No drainage or sinus tenderness. Neck: no JVD and supple, symmetrical, trachea midline Lungs: clear to auscultation bilaterally Heart:  regular rate and rhythm, S1, S2 normal, no murmur, click, rub or gallop Abdomen: soft ttp epigastric area, nd, pos bs no r/g nonacute abd Extremities: extremities normal, atraumatic, no cyanosis or edema Pulses: 2+ and symmetric Skin: Skin color, texture, turgor normal. No rashes or lesions Neurologic: Grossly normal  Labs on Admission:   Recent Labs  11/08/12 0346  NA 129*  K 3.7  CL 94*  CO2 21  GLUCOSE 136*  BUN 17  CREATININE 1.60*  CALCIUM 9.0    Recent Labs  11/08/12 0346  AST 23  ALT 14  ALKPHOS 94  BILITOT 0.6  PROT 7.8  ALBUMIN 3.3*    Recent Labs  11/08/12 0346  LIPASE 25    Recent Labs  11/08/12 0346  WBC 11.0*  NEUTROABS 9.4*  HGB 11.5*  HCT 32.9*  MCV 82.9  PLT 209    Radiological Exams on Admission: Dg Chest 2 View  11/08/2012   CLINICAL DATA:  Body aches.  EXAM: CHEST  2 VIEW  COMPARISON:  03/29/2010  FINDINGS: Heart is borderline enlarged. Lungs are clear. No effusions or edema. No acute bony abnormality. Degenerative changes in the thoracic spine.  IMPRESSION: No active cardiopulmonary disease.   Electronically Signed   By: Charlett Nose M.D.   On: 11/08/2012 04:49   Ct Head Wo Contrast  11/08/2012   *RADIOLOGY REPORT*  Clinical Data: Dizziness,  emesis  CT HEAD WITHOUT CONTRAST  Technique:  Contiguous axial images were obtained from the base of the skull through the vertex without contrast.  Comparison: Prior CT from 03/25/2010  Findings: Diffuse prominence of the CSF containing spaces is not significantly changed as compared to prior examination, compatible with generalized age related atrophy.  Moderate microvascular ischemic disease is similar.  There is no acute intracranial hemorrhage or infarct.  Small punctate calcification overlies the cortical sulci in the left temporal lobe.  No mass or midline shift.  No extra-axial fluid collection.  Calvarium is intact.  The globes are normal.  Paranasal sinuses are clear.  The left mastoid air cells  are clear.  There is partial opacification of the right mastoid air cells with increased sclerosis of the right temporal bone.  IMPRESSION:     1.    No acute intracranial process.        2.    Atrophy with chronic microvascular ischemic changes, similar to prior study. 3.    Chronic right mastoid effusion.  Original Report Authenticated By: Rise Mu, M.D.    Assessment/Plan  79 yo male with 3 days n/v/epi abd pain with dehydration and renal failure Principal Problem:   Renal failure, acute Active Problems:   Hypertension   Nausea & vomiting   Dehydration   Hyponatremia   PUD (peptic ulcer disease)  Place on protonix.  Give gi cocktail.  Ivf.  ua is pending.  Would consult GI for possible egd.  Admit to tele.    [name] A 11/08/2012, 6:12 AM

## 2012-11-08 NOTE — Progress Notes (Signed)
[name] to be D/C'd Home per MD order.  Discussed with the patient and all questions fully answered.    Medication List    STOP taking these medications       allopurinol 300 MG tablet  Commonly known as:  ZYLOPRIM      TAKE these medications       alum & mag hydroxide-simeth 200-200-20 MG/5ML suspension  Commonly known as:  MAALOX/MYLANTA  Take 30 mLs by mouth every 6 (six) hours as needed.     colchicine 0.6 MG tablet  Take 1 tablet (0.6 mg total) by mouth 2 (two) times daily.     esomeprazole 40 MG capsule  Commonly known as:  NEXIUM  Take 40 mg by mouth 2 (two) times daily before a meal.     ondansetron 4 MG tablet  Commonly known as:  ZOFRAN  Take 1 tablet (4 mg total) by mouth every 8 (eight) hours as needed for nausea.     tamsulosin 0.4 MG Caps capsule  Commonly known as:  FLOMAX  Take 0.4 mg by mouth daily.     traMADol 50 MG tablet  Commonly known as:  ULTRAM  Take 50 mg by mouth every 6 (six) hours as needed for pain.        VVS, Skin clean, dry and intact without evidence of skin break down, no evidence of skin tears noted. IV catheter discontinued intact. Site without signs and symptoms of complications. Dressing and pressure applied.  An After Visit Summary was printed and given to the patient. Patient escorted via WC, and D/C home via private auto.  Kennyth Arnold D 11/08/2012 3:09 PM

## 2012-11-08 NOTE — Discharge Summary (Signed)
[facility]                                                                                   [name], is a 79 y.o. male  DOB Nov 24, 1926  MRN [id number].  Admission date:  11/08/2012  Admitting Physician  Tarry Kos, MD  Discharge Date:  11/08/2012   Primary MD  No primary provider on file.  Admission Diagnosis  Dehydration [276.51] Hyponatremia [276.1] Renal failure, acute [584.9] PUD (peptic ulcer disease) [533.90] Hypertension [401.9] Nausea & vomiting [787.01]  Discharge Diagnosis     Principal Problem:   Renal failure, acute Active Problems:   Hypertension   Nausea & vomiting   Dehydration   Hyponatremia   PUD (peptic ulcer disease)      Past Medical History  Diagnosis Date  . Hypertension   . Gout   . Hard of hearing     History reviewed. No pertinent past surgical history.   Recommendations for primary care physician for things to follow:   please make sure patient follows with suggested neurosurgeon and GI physician   Discharge Diagnoses:   Principal Problem:   Renal failure, acute Active Problems:   Hypertension   Nausea & vomiting   Dehydration   Hyponatremia   PUD (peptic ulcer disease)    Discharge Condition: Stable   Follow-up Information   Follow up with PCP. Schedule an appointment as soon as possible for a visit in 4 days.      Follow up with Stan Head, MD. Schedule an appointment as soon as possible for a visit in 1 week.   Specialty:  Gastroenterology   Contact information:   [address] [place] [address] [phone]       Follow up with Tia Alert, MD. Schedule an appointment as soon as possible for a visit in 1 week.   Specialty:  Neurosurgery   Contact information:   [address] [place] [address] [phone]         Consults obtained -     Discharge Medications      Medication List    STOP taking these medications       allopurinol 300 MG tablet   Commonly known as:  ZYLOPRIM      TAKE these medications       alum & mag hydroxide-simeth 200-200-20 MG/5ML suspension  Commonly known as:  MAALOX/MYLANTA  Take 30 mLs by mouth every 6 (six) hours as needed.     colchicine 0.6 MG tablet  Take 1 tablet (0.6 mg total) by mouth 2 (two) times daily.     esomeprazole 40 MG capsule  Commonly known as:  NEXIUM  Take 40 mg by mouth 2 (two) times daily before a meal.     ondansetron 4 MG tablet  Commonly known as:  ZOFRAN  Take 1 tablet (4 mg total) by mouth every 8 (eight) hours as needed for nausea.     tamsulosin 0.4 MG Caps capsule  Commonly known as:  FLOMAX  Take 0.4 mg by mouth daily.     traMADol 50 MG tablet  Commonly known as:  ULTRAM  Take 50  mg by mouth every 6 (six) hours as needed for pain.         Diet and Activity recommendation: See Discharge Instructions below   Discharge Instructions     Follow with Primary MD  in 7 days   Get CBC, CMP, checked 7 days by Primary MD and again as instructed by your Primary MD.    Get Medicines reviewed and adjusted.  Please request your Prim.MD to go over all Hospital Tests and Procedure/Radiological results at the follow up, please get all Hospital records sent to your Prim MD by signing hospital release before you go home.  Activity: As tolerated with Full fall precautions use walker/cane & assistance as needed   Diet:  Heart Healthy  For Heart failure patients - Check your Weight same time everyday, if you gain over 2 pounds, or you develop in leg swelling, experience more shortness of breath or chest pain, call your Primary MD immediately. Follow Cardiac Low Salt Diet and 1.8 lit/day fluid restriction.  Disposition Home    If you experience worsening of your admission symptoms, develop shortness of breath, life threatening emergency, suicidal or homicidal thoughts you must seek medical attention immediately by calling 911 or calling your MD immediately  if symptoms  less severe.  You Must read complete instructions/literature along with all the possible adverse reactions/side effects for all the Medicines you take and that have been prescribed to you. Take any new Medicines after you have completely understood and accpet all the possible adverse reactions/side effects.   Do not drive and provide baby sitting services if your were admitted for syncope or siezures until you have seen by Primary MD or a Neurologist and advised to do so again.  Do not drive when taking Pain medications.    Do not take more than prescribed Pain, Sleep and Anxiety Medications  Special Instructions: If you have smoked or chewed Tobacco  in the last 2 yrs please stop smoking, stop any regular Alcohol  and or any Recreational drug use.  Wear Seat belts while driving.   Please note  You were cared for by a hospitalist during your hospital stay. If you have any questions about your discharge medications or the care you received while you were in the hospital after you are discharged, you can call the unit and asked to speak with the hospitalist on call if the hospitalist that took care of you is not available. Once you are discharged, your primary care physician will handle any further medical issues. Please note that NO REFILLS for any discharge medications will be authorized once you are discharged, as it is imperative that you return to your primary care physician (or establish a relationship with a primary care physician if you do not have one) for your aftercare needs so that they can reassess your need for medications and monitor your lab values.  Major procedures and Radiology Reports - PLEASE review detailed and final reports for all details, in brief -       Dg Chest 2 View  11/08/2012   CLINICAL DATA:  Body aches.  EXAM: CHEST  2 VIEW  COMPARISON:  03/29/2010  FINDINGS: Heart is borderline enlarged. Lungs are clear. No effusions or edema. No acute bony abnormality.  Degenerative changes in the thoracic spine.  IMPRESSION: No active cardiopulmonary disease.   Electronically Signed   By: Charlett Nose M.D.   On: 11/08/2012 04:49   Dg Cervical Spine Complete  11/08/2012   CLINICAL DATA:  Left neck pain  EXAM: CERVICAL SPINE  4+ VIEWS  COMPARISON:  None.  FINDINGS: No prevertebral soft tissue swelling. There is endplate spurring from C3 through C6. Normal spinal laminal line. Oblique projections demonstrate neural foraminal narrowing at C3-C4 on the left and right and C4-C5 on the left. Open mouth odontoid view is normal. .  IMPRESSION: 1. No acute cervical spine findings.  2. Neural foraminal narrowing most severe at C3-C4 and C4-C5 on the left.   Electronically Signed   By: Genevive Bi M.D.   On: 11/08/2012 11:50   Ct Head Wo Contrast  11/08/2012   *RADIOLOGY REPORT*  Clinical Data: Dizziness, emesis  CT HEAD WITHOUT CONTRAST  Technique:  Contiguous axial images were obtained from the base of the skull through the vertex without contrast.  Comparison: Prior CT from 03/25/2010  Findings: Diffuse prominence of the CSF containing spaces is not significantly changed as compared to prior examination, compatible with generalized age related atrophy.  Moderate microvascular ischemic disease is similar.  There is no acute intracranial hemorrhage or infarct.  Small punctate calcification overlies the cortical sulci in the left temporal lobe.  No mass or midline shift.  No extra-axial fluid collection.  Calvarium is intact.  The globes are normal.  Paranasal sinuses are clear.  The left mastoid air cells are clear.  There is partial opacification of the right mastoid air cells with increased sclerosis of the right temporal bone.  IMPRESSION:     1.    No acute intracranial process.        2.    Atrophy with chronic microvascular ischemic changes, similar to prior study. 3.    Chronic right mastoid effusion.  Original Report Authenticated By: Rise Mu, M.D.   Dg Abd  Portable 1v  11/08/2012   *RADIOLOGY REPORT*  Clinical Data: Abdominal pain  PORTABLE ABDOMEN - 1 VIEW  Comparison: 09/17/2012  Findings: Scattered air and stool throughout the bowel.  Negative for obstruction or ileus.  Degenerative changes of the spine with a mild associated scoliosis.  Bones appear osteopenic.  IMPRESSION: Normal bowel gas pattern.   Original Report Authenticated By: Judie Petit. Miles Costain, M.D.    Micro Results     No results found for this or any previous visit (from the past 240 hour(s)).   History of present illness and  Hospital Course:     Kindly see H&P for history of present illness and admission details, please review complete Labs, Consult reports and Test reports for all details in brief [name], is a 79 y.o. male, patient with history of  peptic ulcer disease, gout, chronic joint pains in her belly pain was admitted the hospital for nausea vomiting, likely due to exacerbation of his peptic ulcer disease. He was treated her with supportive care, he was provided with bowel rest and IV Protonix with good results, he is now feeling much better tolerating diet without nausea vomiting. His KUB was unremarkable. He will be placed on Nexium, Zofran as needed and has been requested to follow with his primary gastroenterologist Dr. Leone Payor within a week. Abdominal exam is completely benign.   And also complains of generalized joint pains and aches and carries a history of gout, he was on allopurinol, this likely represents mild gout flare for which I have switched him to colchicine for a few days thereafter he can resume his allopurinol. Have requested him to follow with his primary care physician , ulcer complained of some neck discomfort which has been  ongoing for the last few weeks, C-spine x-ray shows some for some C-spine disease, he is no weakness in upper extremities or lower extremities, pain is nonradiating, one time outpatient followup with neurosurgery recommended post  discharge.    Mild hyponatremia likely due to dehydration, was hydrated back, will request PCP to repeat BMP in a week, his chronic kidney disease stage IV is at baseline.     Today   Subjective:   [name] today has no headache,no chest abdominal pain,no new weakness tingling or numbness, feels much better wants to go home today.    Objective:   Blood pressure 137/74, pulse 93, temperature 98.7 F (37.1 C), temperature source Oral, resp. rate 17, height 5' (1.524 m), weight 62 kg (136 lb 11 oz), SpO2 93.00%.   Intake/Output Summary (Last 24 hours) at 11/08/12 1214 Last data filed at 11/08/12 0930  Gross per 24 hour  Intake    480 ml  Output      0 ml  Net    480 ml    Exam Awake Alert, Oriented *3, No new F.N deficits, Normal affect [place].AT,PERRAL Supple Neck,No JVD, No cervical lymphadenopathy appriciated.  Symmetrical Chest wall movement, Good air movement bilaterally, CTAB RRR,No Gallops,Rubs or new Murmurs, No Parasternal Heave +ve B.Sounds, Abd Soft, Non tender, No organomegaly appriciated, No rebound -guarding or rigidity. No Cyanosis, Clubbing or edema, No new Rash or bruise  Data Review   CBC w Diff: Lab Results  Component Value Date   WBC 11.0* 11/08/2012   HGB 11.5* 11/08/2012   HCT 32.9* 11/08/2012   PLT 209 11/08/2012   LYMPHOPCT 7* 11/08/2012   MONOPCT 7 11/08/2012   EOSPCT 1 11/08/2012   BASOPCT 0 11/08/2012    CMP: Lab Results  Component Value Date   NA 129* 11/08/2012   K 3.7 11/08/2012   CL 94* 11/08/2012   CO2 21 11/08/2012   BUN 17 11/08/2012   CREATININE 1.60* 11/08/2012   PROT 7.8 11/08/2012   ALBUMIN 3.3* 11/08/2012   BILITOT 0.6 11/08/2012   ALKPHOS 94 11/08/2012   AST 23 11/08/2012   ALT 14 11/08/2012  .   Total Time in preparing paper work, data evaluation and todays exam - 35 minutes  Leroy Sea M.D on 11/08/2012 at 12:14 PM  [facility] Office  [phone]

## 2012-11-08 NOTE — Progress Notes (Signed)
[name] [id number] Admitted to 4U98: 11/08/2012 6:57 AM Attending Provider: Tarry Kos, MD    [name] is a 79 y.o. male patient admitted from ED awake, alert  & orientated  X 3,  Full Code, VSS - Blood pressure 137/74, pulse 93, temperature 98.7 F (37.1 C), temperature source Oral, resp. rate 17, height 5' (1.524 m), weight 62 kg (136 lb 11 oz), SpO2 93.00%., room air, no c/o shortness of breath, no c/o chest pain, no distress noted. Tele # 05 placed and pt is currently running:normal sinus rhythm.   IV site WDL:  antecubital right, condition patent and no redness with a transparent dsg that's clean dry and intact.  Allergies:  No Known Allergies   Past Medical History  Diagnosis Date  . Hypertension   . Gout   . Hard of hearing     History:  obtained from daughter Yvone Neu [phone].  Pt orientation to unit, room and routine. Information packet given to patient/family and safety video watched.  Admission INP armband ID verified with patient/family, and in place. SR up x 2, fall risk assessment complete with Patient and family verbalizing understanding of risks associated with falls. Pt verbalizes an understanding of how to use the call bell and to call for help before getting out of bed.  Skin, clean-dry- intact without evidence of bruising, or skin tears.   No evidence of skin break down noted on exam.    Will cont to monitor and assist as needed.  Tessie Eke, RN 11/08/2012 6:57 AM

## 2012-11-08 NOTE — Progress Notes (Signed)
Called for report in ED. RN will call back to give report.

## 2012-11-08 NOTE — ED Notes (Signed)
Daughter reported that pt. Is complaining of generalized body aches , gout flare up , upper abdominal pain for several  days , daughter translating Surveyor, minerals) for pt.

## 2012-11-08 NOTE — Care Management Note (Signed)
    Page 1 of 1   11/08/2012     2:14:56 PM   CARE MANAGEMENT NOTE 11/08/2012  Patient:  [name], [name]   Account Number:  [id number]  Date Initiated:  11/08/2012  Documentation initiated by:  Letha Cape  Subjective/Objective Assessment:   dx renal failure  admit - from home.     Action/Plan:   Anticipated DC Date:  11/08/2012   Anticipated DC Plan:  HOME/SELF CARE      DC Planning Services  CM consult      Choice offered to / List presented to:             Status of service:  Completed, signed off Medicare Important Message given?   (If response is "NO", the following Medicare IM given date fields will be blank) Date Medicare IM given:   Date Additional Medicare IM given:    Discharge Disposition:  HOME/SELF CARE  Per UR Regulation:  Reviewed for med. necessity/level of care/duration of stay  If discussed at Long Length of Stay Meetings, dates discussed:    Comments:  11/08/12 14:13 Letha Cape RN, BSN [phone] patient is from home, patient is for dc today, no needs anticipated.

## 2012-11-09 NOTE — Progress Notes (Signed)
Utilization review complete. Jaqwan Wieber RN CCM Case Mgmt  

## 2012-11-12 ENCOUNTER — Encounter: Payer: Self-pay | Admitting: Internal Medicine

## 2012-12-15 ENCOUNTER — Encounter: Payer: Self-pay | Admitting: Gastroenterology

## 2012-12-17 ENCOUNTER — Encounter: Payer: Self-pay | Admitting: Internal Medicine

## 2012-12-17 ENCOUNTER — Ambulatory Visit (INDEPENDENT_AMBULATORY_CARE_PROVIDER_SITE_OTHER): Payer: PRIVATE HEALTH INSURANCE | Admitting: Internal Medicine

## 2012-12-17 ENCOUNTER — Ambulatory Visit (INDEPENDENT_AMBULATORY_CARE_PROVIDER_SITE_OTHER)
Admission: RE | Admit: 2012-12-17 | Discharge: 2012-12-17 | Disposition: A | Discharge status: Home / Self Care | Payer: PRIVATE HEALTH INSURANCE | Source: Ambulatory Visit | Attending: Internal Medicine | Admitting: Internal Medicine

## 2012-12-17 VITALS — BP 136/72 | HR 88 | Ht 60.0 in | Wt 148.0 lb

## 2012-12-17 DIAGNOSIS — R1013 Epigastric pain: Secondary | ICD-10-CM

## 2012-12-17 DIAGNOSIS — R1031 Right lower quadrant pain: Secondary | ICD-10-CM

## 2012-12-17 DIAGNOSIS — M545 Low back pain: Secondary | ICD-10-CM

## 2012-12-17 DIAGNOSIS — R1032 Left lower quadrant pain: Secondary | ICD-10-CM

## 2012-12-17 DIAGNOSIS — D649 Anemia, unspecified: Secondary | ICD-10-CM

## 2012-12-17 DIAGNOSIS — G8929 Other chronic pain: Secondary | ICD-10-CM

## 2012-12-17 DIAGNOSIS — Z8719 Personal history of other diseases of the digestive system: Secondary | ICD-10-CM

## 2012-12-17 NOTE — Progress Notes (Signed)
Subjective:    Patient ID: [name], male    DOB: Aug 24, 1926, 79 y.o.   MRN: [id number]  HPI Is an 79 year old man from [place] here with his daughter and an interpreter. There are 2 areas of complaints. One is epigastric pain, intermittent with associated abdominal distention. He is eating well without early satiety or heartburn, he continues to take a PPI, Nexium. There has been some question as to whether or not he is having black stools, he was admitted to the hospital in September with nausea and vomiting and a question of melena though there were no Hemoccult test done. He has a mild anemia.  He also has a many year history of bilateral lower quadrant abdominal pain is not associated with eating, defecation or anything like that. He had a CT of the abdomen and pelvis in August that was unrevealing. He is on a trial of Cipro for a question of prostatitis to see if that will help but it is not so far. He has dicyclomine, that does not help the pain. He has chronic back pain as well. Lumbar area. He has had numerous ER visits and hospitalizations for the lower abdominal pain without cause found. His daughter indicates that there must be something causing this and she would like this fixed so he can feel better. Sometimes the pain does disturb her sleep. Reportedly he had a colonoscopy 3 years ago, by Dr. Elnoria Howard.  In 2010 I found a 3 cm gastric ulcer and he had H. pylori serology positive. He took pylera to treat that and he was not interested in a followup EGD as he was traveling back to time. It was recommended. He had been on in stage at that time it was thought that that was the cause of the ulcer.  Allergies  Allergen Reactions  . Phenazopyridine Hcl   . Septra [Sulfamethoxazole-Tmp Ds]    Outpatient Prescriptions Prior to Visit  Medication Sig Dispense Refill  . allopurinol (ZYLOPRIM) 300 MG tablet Take 300 mg by mouth daily.      . colchicine 0.6 MG tablet Take 1 tablet (0.6 mg  total) by mouth 2 (two) times daily.  10 tablet  0  . diazepam (VALIUM) 2 MG tablet Take 2 mg by mouth 2 (two) times daily.      [name] dicyclomine (BENTYL) 20 MG tablet Take 20 mg by mouth 4 (four) times daily.      [name] esomeprazole (NEXIUM) 40 MG capsule Take 40 mg by mouth daily before breakfast.       . lisinopril (PRINIVIL,ZESTRIL) 5 MG tablet Take 5 mg by mouth daily.      . ondansetron (ZOFRAN) 4 MG tablet Take 1 tablet (4 mg total) by mouth every 8 (eight) hours as needed for nausea.  20 tablet  0  . traMADol (ULTRAM) 50 MG tablet Take 50 mg by mouth every 6 (six) hours as needed for pain.      [name] alum & mag hydroxide-simeth (MAALOX/MYLANTA) 200-200-20 MG/5ML suspension Take 30 mLs by mouth every 6 (six) hours as needed.  150 mL  0  . tamsulosin (FLOMAX) 0.4 MG CAPS capsule Take 0.4 mg by mouth daily.       No facility-administered medications prior to visit.   Past Medical History  Diagnosis Date  . Hypertension   . Gout   . Hard of hearing   . Anemia   . AAA (abdominal aortic aneurysm)   . Depression   . Hypopotassemia   .  Hyperplasia, prostate   . Renal failure   . Asthma   . H. pylori infection   . PUD (peptic ulcer disease)   . Hiatal hernia    Past Surgical History  Procedure Laterality Date  . Esophagogastroduodenoscopy  06/13/2008   History   Social History  . Marital Status: Married    Spouse Name: N/A    Number of Children: N/A  . Years of Education: N/A   Social History Main Topics  . Smoking status: Former Games developer  . Smokeless tobacco: Never Used     Comment: Quit seven years ago   . Alcohol Use: No  . Drug Use: No    FHx noncontributorry  Review of Systems Positive for those things mentioned in the history of present illness, all other review of systems are negative except that he is hard of hearing    Objective:   Physical Exam General:  Well-developed, well-nourished and in no acute distress, he is an elderly Asian man Eyes:  anicteric. ENT:    Mouth and posterior pharynx free of lesions. He is edentulous Neck:   supple w/o thyromegaly or mass.  Lungs: Clear to auscultation bilaterally. Heart:  S1S2, no rubs, murmurs, gallops. Abdomen:  soft, non-tender, no hepatosplenomegaly, hernia, or mass and BS+.  Rectal: Manson Passey heme-negative stool no mass Lymph:  no cervical or supraclavicular adenopathy. Extremities:   no edema Neuro:  Hard of hearing Psych:  appropriate mood and  Affect.   Data Reviewed: [facility], summer field notes. Hospital notes and labs.  Lab Results  Component Value Date   WBC 11.0* 11/08/2012   HGB 11.5* 11/08/2012   HCT 32.9* 11/08/2012   MCV 82.9 11/08/2012   PLT 209 11/08/2012     June hemoglobin 11.5 and MCV 88       Assessment & Plan:   1. Abdominal pain, epigastric   2. Anemia   3. Chronic bilateral lower abdominal pain   4. Low back pain   5. History of gastric ulcer    1. Regarding epigastric pain given his history of ulcer etc. and a question of black stools we'll perform an EGD. 2. The lower abdominal pain sounds neuropathic to me. Functional possibly. I don't think any other significant studies are indicated that we will go ahead and get some low back films to see if he has a bad spine which is probable. He may be a candidate for some type of treatment address towards neuropathic pain, perhaps Cymbalta generic would be useful. I would child way from a tricyclic, will probably go ahead and stop the dicyclomine given his 79 age and especially since it doesn't seem to help him. Await the studies above. 3. The risks and benefits as well as alternatives of endoscopic procedure(s) have been discussed and reviewed. All questions answered. The patient agrees to proceed.

## 2012-12-17 NOTE — Patient Instructions (Addendum)
Today please go to the basement to have x-rays done.  You have been scheduled for an endoscopy with propofol. Please follow written instructions given to you at your visit today. If you use inhalers (even only as needed), please bring them with you on the day of your procedure.  I appreciate the opportunity to care for you.

## 2012-12-18 NOTE — Progress Notes (Signed)
Quick Note:  Tell daughter that he has a lot of arthritis in spine that can cause back pain and could also be related to abdominal pain.I can discuss more at EGD next week ______

## 2012-12-31 ENCOUNTER — Ambulatory Visit (AMBULATORY_SURGERY_CENTER): Payer: PRIVATE HEALTH INSURANCE | Admitting: Internal Medicine

## 2012-12-31 ENCOUNTER — Other Ambulatory Visit (INDEPENDENT_AMBULATORY_CARE_PROVIDER_SITE_OTHER): Payer: PRIVATE HEALTH INSURANCE

## 2012-12-31 ENCOUNTER — Encounter: Payer: Self-pay | Admitting: Internal Medicine

## 2012-12-31 VITALS — BP 158/81 | HR 82 | Temp 98.6°F | Resp 27 | Ht 60.0 in | Wt 148.0 lb

## 2012-12-31 DIAGNOSIS — D649 Anemia, unspecified: Secondary | ICD-10-CM

## 2012-12-31 DIAGNOSIS — R1013 Epigastric pain: Secondary | ICD-10-CM

## 2012-12-31 DIAGNOSIS — K449 Diaphragmatic hernia without obstruction or gangrene: Secondary | ICD-10-CM

## 2012-12-31 LAB — CBC WITH DIFFERENTIAL/PLATELET
Basophils Absolute: 0 10*3/uL (ref 0.0–0.1)
Basophils Relative: 0.2 % (ref 0.0–3.0)
Eosinophils Relative: 2.9 % (ref 0.0–5.0)
HCT: 34.2 % — ABNORMAL LOW (ref 39.0–52.0)
Hemoglobin: 11.6 g/dL — ABNORMAL LOW (ref 13.0–17.0)
Lymphocytes Relative: 17 % (ref 12.0–46.0)
MCHC: 34 g/dL (ref 30.0–36.0)
MCV: 84.6 fl (ref 78.0–100.0)
Monocytes Absolute: 0.5 10*3/uL (ref 0.1–1.0)
Monocytes Relative: 6.6 % (ref 3.0–12.0)
Neutrophils Relative %: 73.3 % (ref 43.0–77.0)
Platelets: 257 10*3/uL (ref 150.0–400.0)
RBC: 4.05 Mil/uL — ABNORMAL LOW (ref 4.22–5.81)
RDW: 17.5 % — ABNORMAL HIGH (ref 11.5–14.6)
WBC: 7.8 10*3/uL (ref 4.5–10.5)

## 2012-12-31 LAB — FERRITIN: Ferritin: 142.2 ng/mL (ref 22.0–322.0)

## 2012-12-31 LAB — VITAMIN B12: Vitamin B-12: 812 pg/mL (ref 211–911)

## 2012-12-31 MED ORDER — SODIUM CHLORIDE 0.9 % IV SOLN: 500.0000 mL | INTRAVENOUS | Status: DC

## 2012-12-31 NOTE — Progress Notes (Signed)
Patient did not experience any of the following events: a burn prior to discharge; a fall within the facility; wrong site/side/patient/procedure/implant event; or a hospital transfer or hospital admission upon discharge from the facility. (G8907) Patient did not have preoperative order for IV antibiotic SSI prophylaxis. (G8918)  

## 2012-12-31 NOTE — Op Note (Signed)
[facility] [address] [place], [address]   ENDOSCOPY PROCEDURE REPORT  PATIENT: Jonathon, Robinson  MR#: [id number] BIRTHDATE: 1926/07/01 , 86  yrs. old GENDER: Male ENDOSCOPIST: Iva Boop, MD, [facility] PROCEDURE DATE:  12/31/2012 PROCEDURE:  EGD, diagnostic ASA CLASS:     Class III INDICATIONS:  Epigastric pain.   Anemia. MEDICATIONS: propofol (Diprivan) 100mg  IV, MAC sedation, administered by CRNA, and These medications were titrated to patient response per physician's verbal order TOPICAL ANESTHETIC: Cetacaine Spray  DESCRIPTION OF PROCEDURE: After the risks benefits and alternatives of the procedure were thoroughly explained, informed consent was obtained.  The LB [id number] endoscope was introduced through the mouth and advanced to the second portion of the duodenum. Without limitations.  The instrument was slowly withdrawn as the mucosa was fully examined.        STOMACH: A 2 cm hiatal hernia was noted.  The remainder of the upper endoscopy exam was otherwise normal. Retroflexed views revealed a hiatal hernia.     The scope was then withdrawn from the patient and the procedure completed.  COMPLICATIONS: There were no complications. ENDOSCOPIC IMPRESSION: 1.   2 cm hiatal hernia 2.   The remainder of the upper endoscopy exam was otherwise normal  RECOMMENDATIONS: CBC, ferritin and B12 level today - will call results/plans  REPEAT EXAM:  eSigned:  Iva Boop, MD, [facility] 12/31/2012 9:17 AM  FA:OZHY Andrey Campanile, MD and The Patient

## 2012-12-31 NOTE — Progress Notes (Signed)
Pt was breathing fast and was wheezing, occasional cough, advised Dr. Leone Payor about pt, Dr. Leone Payor came into admitting and listened to pt, ok to proceed.-adm

## 2012-12-31 NOTE — Patient Instructions (Addendum)
This exam was ok. Ulcer is gone.  Please go to lab today where we will draw blood to check iron level, vitamin B12 level and blood count. We will call with results and plans.  I appreciate the opportunity to care for you. Iva Boop, MD, [facility]  Discharge instructions given with verbal understanding. Handout on a hiatal hernia. Resume previous medications. Patient to go to lab after discharge. YOU HAD AN ENDOSCOPIC PROCEDURE TODAY AT THE [facility]: Refer to the procedure report that was given to you for any specific questions about what was found during the examination.  If the procedure report does not answer your questions, please call your gastroenterologist to clarify.  If you requested that your care partner not be given the details of your procedure findings, then the procedure report has been included in a sealed envelope for you to review at your convenience later.  YOU SHOULD EXPECT: Some feelings of bloating in the abdomen. Passage of more gas than usual.  Walking can help get rid of the air that was put into your GI tract during the procedure and reduce the bloating. If you had a lower endoscopy (such as a colonoscopy or flexible sigmoidoscopy) you may notice spotting of blood in your stool or on the toilet paper. If you underwent a bowel prep for your procedure, then you may not have a normal bowel movement for a few days.  DIET: Your first meal following the procedure should be a light meal and then it is ok to progress to your normal diet.  A half-sandwich or bowl of soup is an example of a good first meal.  Heavy or fried foods are harder to digest and may make you feel nauseous or bloated.  Likewise meals heavy in dairy and vegetables can cause extra gas to form and this can also increase the bloating.  Drink plenty of fluids but you should avoid alcoholic beverages for 24 hours.  ACTIVITY: Your care partner should take you home directly after the procedure.  You  should plan to take it easy, moving slowly for the rest of the day.  You can resume normal activity the day after the procedure however you should NOT DRIVE or use heavy machinery for 24 hours (because of the sedation medicines used during the test).    SYMPTOMS TO REPORT IMMEDIATELY: A gastroenterologist can be reached at any hour.  During normal business hours, 8:30 AM to 5:00 PM Monday through Friday, call [phone].  After hours and on weekends, please call the GI answering service at [phone] who will take a message and have the physician on call contact you.   Following upper endoscopy (EGD)  Vomiting of blood or coffee ground material  New chest pain or pain under the shoulder blades  Painful or persistently difficult swallowing  New shortness of breath  Fever of 100F or higher  Black, tarry-looking stools  FOLLOW UP: If any biopsies were taken you will be contacted by phone or by letter within the next 1-3 weeks.  Call your gastroenterologist if you have not heard about the biopsies in 3 weeks.  Our staff will call the home number listed on your records the next business day following your procedure to check on you and address any questions or concerns that you may have at that time regarding the information given to you following your procedure. This is a courtesy call and so if there is no answer at the home number and  we have not heard from you through the emergency physician on call, we will assume that you have returned to your regular daily activities without incident.  SIGNATURES/CONFIDENTIALITY: You and/or your care partner have signed paperwork which will be entered into your electronic medical record.  These signatures attest to the fact that that the information above on your After Visit Summary has been reviewed and is understood.  Full responsibility of the confidentiality of this discharge information lies with you and/or your care-partner.

## 2013-01-03 ENCOUNTER — Telehealth: Payer: Self-pay | Admitting: *Deleted

## 2013-01-03 NOTE — Telephone Encounter (Signed)
  Follow up Call-  Call back number 12/31/2012  Post procedure Call Back phone  # [phone]  Permission to leave phone message Yes     Patient questions:  Do you have a fever, pain , or abdominal swelling? no Pain Score  0 *  Have you tolerated food without any problems? yes  Have you been able to return to your normal activities? yes  Do you have any questions about your discharge instructions: Diet   no Medications  no Follow up visit  no  Do you have questions or concerns about your Care? no  Actions: * If pain score is 4 or above: No action needed, pain <4.

## 2013-01-03 NOTE — Progress Notes (Signed)
Quick Note:  These labs look good Do not think he needs any more testing I can see him back in office next available to discuss possible treatment Please tell daughter ______

## 2013-02-10 HISTORY — PX: CAROTID STENT INSERTION: SHX5766

## 2013-02-17 ENCOUNTER — Ambulatory Visit (INDEPENDENT_AMBULATORY_CARE_PROVIDER_SITE_OTHER): Payer: PRIVATE HEALTH INSURANCE | Admitting: Internal Medicine

## 2013-02-17 ENCOUNTER — Encounter: Payer: Self-pay | Admitting: Internal Medicine

## 2013-02-17 VITALS — BP 130/72 | HR 94 | Ht 61.0 in | Wt 148.0 lb

## 2013-02-17 DIAGNOSIS — R1032 Left lower quadrant pain: Secondary | ICD-10-CM

## 2013-02-17 DIAGNOSIS — G8929 Other chronic pain: Secondary | ICD-10-CM | POA: Insufficient documentation

## 2013-02-17 DIAGNOSIS — R1031 Right lower quadrant pain: Secondary | ICD-10-CM

## 2013-02-17 NOTE — Progress Notes (Signed)
         Subjective:    Patient ID: [name], male    DOB: [birth year], 80 y.o.   MRN: [id number]  HPI Elderly [place] man w/ hx gastric ulcer - resolved. Hx chronic lower abdominal pain.  Better overall per daughter. He is c/o left neck pain when he moves his neck. PCP started citalopram - he took one dose and felt off so will not take. Medications, allergies, past medical history, past surgical history, family history and social history are reviewed and updated in the EMR.   Review of Systems As above    Objective:   Physical Exam  Elderly NAD      Assessment & Plan:  Chronic bilateral lower abdominal pain  Improved. F/u prn.

## 2013-02-17 NOTE — Patient Instructions (Signed)
Glad your feeling better and just come back and see [place] as needed.   I appreciate the opportunity to care for you.

## 2013-04-28 ENCOUNTER — Inpatient Hospital Stay (HOSPITAL_COMMUNITY)
Admission: EM | Admit: 2013-04-28 | Discharge: 2013-05-11 | DRG: 252 | Disposition: A | Discharge status: Home Health | Payer: PRIVATE HEALTH INSURANCE | Attending: Cardiology | Admitting: Cardiology

## 2013-04-28 ENCOUNTER — Emergency Department (HOSPITAL_COMMUNITY): Payer: PRIVATE HEALTH INSURANCE

## 2013-04-28 ENCOUNTER — Encounter (HOSPITAL_COMMUNITY): Payer: Self-pay | Admitting: Emergency Medicine

## 2013-04-28 DIAGNOSIS — E785 Hyperlipidemia, unspecified: Secondary | ICD-10-CM | POA: Diagnosis present

## 2013-04-28 DIAGNOSIS — I129 Hypertensive chronic kidney disease with stage 1 through stage 4 chronic kidney disease, or unspecified chronic kidney disease: Secondary | ICD-10-CM | POA: Diagnosis present

## 2013-04-28 DIAGNOSIS — I214 Non-ST elevation (NSTEMI) myocardial infarction: Secondary | ICD-10-CM | POA: Diagnosis present

## 2013-04-28 DIAGNOSIS — I5041 Acute combined systolic (congestive) and diastolic (congestive) heart failure: Principal | ICD-10-CM | POA: Diagnosis present

## 2013-04-28 DIAGNOSIS — Z87891 Personal history of nicotine dependence: Secondary | ICD-10-CM

## 2013-04-28 DIAGNOSIS — N183 Chronic kidney disease, stage 3 unspecified: Secondary | ICD-10-CM | POA: Diagnosis present

## 2013-04-28 DIAGNOSIS — I714 Abdominal aortic aneurysm, without rupture, unspecified: Secondary | ICD-10-CM | POA: Diagnosis present

## 2013-04-28 DIAGNOSIS — F3289 Other specified depressive episodes: Secondary | ICD-10-CM | POA: Diagnosis present

## 2013-04-28 DIAGNOSIS — R0600 Dyspnea, unspecified: Secondary | ICD-10-CM | POA: Diagnosis present

## 2013-04-28 DIAGNOSIS — I2589 Other forms of chronic ischemic heart disease: Secondary | ICD-10-CM | POA: Diagnosis present

## 2013-04-28 DIAGNOSIS — J45909 Unspecified asthma, uncomplicated: Secondary | ICD-10-CM | POA: Diagnosis present

## 2013-04-28 DIAGNOSIS — I2582 Chronic total occlusion of coronary artery: Secondary | ICD-10-CM | POA: Diagnosis present

## 2013-04-28 DIAGNOSIS — I6529 Occlusion and stenosis of unspecified carotid artery: Secondary | ICD-10-CM | POA: Diagnosis present

## 2013-04-28 DIAGNOSIS — I959 Hypotension, unspecified: Secondary | ICD-10-CM | POA: Diagnosis present

## 2013-04-28 DIAGNOSIS — G8190 Hemiplegia, unspecified affecting unspecified side: Secondary | ICD-10-CM | POA: Diagnosis not present

## 2013-04-28 DIAGNOSIS — R4789 Other speech disturbances: Secondary | ICD-10-CM | POA: Diagnosis present

## 2013-04-28 DIAGNOSIS — Z79899 Other long term (current) drug therapy: Secondary | ICD-10-CM

## 2013-04-28 DIAGNOSIS — I10 Essential (primary) hypertension: Secondary | ICD-10-CM

## 2013-04-28 DIAGNOSIS — I739 Peripheral vascular disease, unspecified: Secondary | ICD-10-CM | POA: Diagnosis present

## 2013-04-28 DIAGNOSIS — I2510 Atherosclerotic heart disease of native coronary artery without angina pectoris: Secondary | ICD-10-CM

## 2013-04-28 DIAGNOSIS — F329 Major depressive disorder, single episode, unspecified: Secondary | ICD-10-CM | POA: Diagnosis present

## 2013-04-28 DIAGNOSIS — I779 Disorder of arteries and arterioles, unspecified: Secondary | ICD-10-CM

## 2013-04-28 DIAGNOSIS — I509 Heart failure, unspecified: Secondary | ICD-10-CM | POA: Diagnosis present

## 2013-04-28 DIAGNOSIS — I639 Cerebral infarction, unspecified: Secondary | ICD-10-CM

## 2013-04-28 DIAGNOSIS — I6350 Cerebral infarction due to unspecified occlusion or stenosis of unspecified cerebral artery: Secondary | ICD-10-CM | POA: Diagnosis not present

## 2013-04-28 DIAGNOSIS — I4891 Unspecified atrial fibrillation: Secondary | ICD-10-CM | POA: Diagnosis present

## 2013-04-28 DIAGNOSIS — I498 Other specified cardiac arrhythmias: Secondary | ICD-10-CM | POA: Diagnosis present

## 2013-04-28 DIAGNOSIS — M109 Gout, unspecified: Secondary | ICD-10-CM | POA: Diagnosis present

## 2013-04-28 DIAGNOSIS — N179 Acute kidney failure, unspecified: Secondary | ICD-10-CM | POA: Diagnosis present

## 2013-04-28 DIAGNOSIS — Z8673 Personal history of transient ischemic attack (TIA), and cerebral infarction without residual deficits: Secondary | ICD-10-CM | POA: Diagnosis present

## 2013-04-28 LAB — CBC WITH DIFFERENTIAL/PLATELET
BASOS PCT: 0 % (ref 0–1)
Basophils Absolute: 0 10*3/uL (ref 0.0–0.1)
Eosinophils Absolute: 0.2 10*3/uL (ref 0.0–0.7)
Eosinophils Relative: 2 % (ref 0–5)
HEMATOCRIT: 38.1 % — AB (ref 39.0–52.0)
HEMOGLOBIN: 13 g/dL (ref 13.0–17.0)
LYMPHS ABS: 1.4 10*3/uL (ref 0.7–4.0)
Lymphocytes Relative: 15 % (ref 12–46)
MCH: 29.5 pg (ref 26.0–34.0)
MCHC: 34.1 g/dL (ref 30.0–36.0)
MCV: 86.4 fL (ref 78.0–100.0)
MONO ABS: 0.6 10*3/uL (ref 0.1–1.0)
MONOS PCT: 6 % (ref 3–12)
NEUTROS ABS: 7.1 10*3/uL (ref 1.7–7.7)
Neutrophils Relative %: 76 % (ref 43–77)
Platelets: 225 10*3/uL (ref 150–400)
RBC: 4.41 MIL/uL (ref 4.22–5.81)
RDW: 14.3 % (ref 11.5–15.5)
WBC: 9.3 10*3/uL (ref 4.0–10.5)

## 2013-04-28 LAB — HEPARIN LEVEL (UNFRACTIONATED): Heparin Unfractionated: 0.54 IU/mL (ref 0.30–0.70)

## 2013-04-28 LAB — COMPREHENSIVE METABOLIC PANEL
ALBUMIN: 3.5 g/dL (ref 3.5–5.2)
ALT: 15 U/L (ref 0–53)
AST: 25 U/L (ref 0–37)
Alkaline Phosphatase: 75 U/L (ref 39–117)
BILIRUBIN TOTAL: 0.5 mg/dL (ref 0.3–1.2)
BUN: 24 mg/dL — ABNORMAL HIGH (ref 6–23)
CHLORIDE: 101 meq/L (ref 96–112)
CO2: 21 mEq/L (ref 19–32)
CREATININE: 1.42 mg/dL — AB (ref 0.50–1.35)
Calcium: 9.4 mg/dL (ref 8.4–10.5)
GFR, EST AFRICAN AMERICAN: 51 mL/min — AB (ref >90)
GFR, EST NON AFRICAN AMERICAN: 44 mL/min — AB (ref >90)
Glucose, Bld: 129 mg/dL — ABNORMAL HIGH (ref 70–99)
POTASSIUM: 3.6 meq/L — AB (ref 3.7–5.3)
Sodium: 140 mEq/L (ref 137–147)
Total Protein: 7.5 g/dL (ref 6.0–8.3)

## 2013-04-28 LAB — PROTIME-INR
INR: 0.93 (ref 0.00–1.49)
Prothrombin Time: 12.3 seconds (ref 11.6–15.2)

## 2013-04-28 LAB — I-STAT TROPONIN, ED: TROPONIN I, POC: 0.21 ng/mL — AB (ref 0.00–0.08)

## 2013-04-28 LAB — MRSA PCR SCREENING: MRSA BY PCR: NEGATIVE

## 2013-04-28 LAB — TSH: TSH: 2.159 u[IU]/mL (ref 0.350–4.500)

## 2013-04-28 LAB — PRO B NATRIURETIC PEPTIDE: Pro B Natriuretic peptide (BNP): 2543 pg/mL — ABNORMAL HIGH (ref 0–450)

## 2013-04-28 LAB — TROPONIN I: Troponin I: 0.63 ng/mL (ref <0.30)

## 2013-04-28 MED ORDER — FUROSEMIDE 10 MG/ML IJ SOLN
40.0000 mg | Freq: Two times a day (BID) | INTRAMUSCULAR | Status: DC
  Administered 2013-04-29: 40 mg via INTRAVENOUS
  Filled 2013-04-28 (×2): qty 4

## 2013-04-28 MED ORDER — HEPARIN (PORCINE) IN NACL 100-0.45 UNIT/ML-% IJ SOLN
800.0000 [IU]/h | INTRAMUSCULAR | Status: DC
  Administered 2013-04-28 – 2013-05-01 (×3): 900 [IU]/h via INTRAVENOUS
  Filled 2013-04-28 (×7): qty 250

## 2013-04-28 MED ORDER — HEPARIN BOLUS VIA INFUSION
4000.0000 [IU] | Freq: Once | INTRAVENOUS | Status: AC
  Administered 2013-04-28: 4000 [IU] via INTRAVENOUS
  Filled 2013-04-28: qty 4000

## 2013-04-28 MED ORDER — NITROGLYCERIN 0.4 MG SL SUBL: 0.4000 mg | SUBLINGUAL_TABLET | SUBLINGUAL | Status: DC | PRN

## 2013-04-28 MED ORDER — FUROSEMIDE 10 MG/ML IJ SOLN
40.0000 mg | Freq: Once | INTRAMUSCULAR | Status: AC
  Administered 2013-04-28: 40 mg via INTRAVENOUS
  Filled 2013-04-28: qty 4

## 2013-04-28 MED ORDER — DICYCLOMINE HCL 20 MG PO TABS
20.0000 mg | ORAL_TABLET | Freq: Four times a day (QID) | ORAL | Status: DC
  Administered 2013-04-28 – 2013-05-11 (×43): 20 mg via ORAL
  Filled 2013-04-28 (×57): qty 1

## 2013-04-28 MED ORDER — ATORVASTATIN CALCIUM 40 MG PO TABS
40.0000 mg | ORAL_TABLET | Freq: Every day | ORAL | Status: DC
  Administered 2013-04-29 – 2013-05-10 (×12): 40 mg via ORAL
  Filled 2013-04-28 (×16): qty 1

## 2013-04-28 MED ORDER — METOPROLOL TARTRATE 12.5 MG HALF TABLET
12.5000 mg | ORAL_TABLET | Freq: Two times a day (BID) | ORAL | Status: DC
Start: 2013-04-28 — End: 2013-04-30
  Administered 2013-04-28 – 2013-04-30 (×5): 12.5 mg via ORAL
  Filled 2013-04-28 (×6): qty 1

## 2013-04-28 MED ORDER — ALLOPURINOL 300 MG PO TABS
300.0000 mg | ORAL_TABLET | Freq: Every day | ORAL | Status: DC
  Administered 2013-04-28 – 2013-05-01 (×4): 300 mg via ORAL
  Filled 2013-04-28 (×4): qty 1

## 2013-04-28 MED ORDER — PANTOPRAZOLE SODIUM 40 MG PO TBEC
40.0000 mg | DELAYED_RELEASE_TABLET | Freq: Every day | ORAL | Status: DC
  Administered 2013-04-28 – 2013-05-11 (×13): 40 mg via ORAL
  Filled 2013-04-28 (×13): qty 1

## 2013-04-28 MED ORDER — ASPIRIN 325 MG PO TABS
325.0000 mg | ORAL_TABLET | Freq: Once | ORAL | Status: AC
  Administered 2013-04-28: 325 mg via ORAL
  Filled 2013-04-28: qty 1

## 2013-04-28 MED ORDER — IPRATROPIUM-ALBUTEROL 0.5-2.5 (3) MG/3ML IN SOLN
3.0000 mL | Freq: Once | RESPIRATORY_TRACT | Status: AC
  Administered 2013-04-28: 3 mL via RESPIRATORY_TRACT
  Filled 2013-04-28: qty 3

## 2013-04-28 MED ORDER — NITROGLYCERIN IN D5W 200-5 MCG/ML-% IV SOLN
10.0000 ug/min | INTRAVENOUS | Status: DC
  Administered 2013-04-28: 10 ug/min via INTRAVENOUS
  Filled 2013-04-28: qty 250

## 2013-04-28 MED ORDER — COLCHICINE 0.6 MG PO TABS
0.6000 mg | ORAL_TABLET | Freq: Two times a day (BID) | ORAL | Status: DC
  Administered 2013-04-28: 0.6 mg via ORAL
  Filled 2013-04-28 (×3): qty 1

## 2013-04-28 MED ORDER — ASPIRIN EC 81 MG PO TBEC
81.0000 mg | DELAYED_RELEASE_TABLET | Freq: Every day | ORAL | Status: DC
  Administered 2013-04-29 – 2013-05-10 (×11): 81 mg via ORAL
  Filled 2013-04-28 (×12): qty 1

## 2013-04-28 NOTE — ED Notes (Signed)
Pt in sinus rhythm on monitor. Denies chest pain. States "it just feels tight from my cough."

## 2013-04-28 NOTE — H&P (Signed)
HPI: 80 year old male with past medical history of hypertension, abdominal aortic aneurysm, for evaluation of atrial fibrillation, congestive heart failure and epigastric pain. Note patient does not speak [place] and history is obtained with the assistance of his daughter. Echocardiogram in May of 2010 showed an ejection fraction of 55-60%. Abdominal CT in August of 2014 showed an infrarenal abdominal aortic aneurysm measuring 3.5 cm. Over the past 4 days patient note or worsening dyspnea. There is orthopnea and no pedal edema. He has had intermittent epigastric pain described as a tightness. It increases with inspiration. No palpitations. No syncope no history of bleeding. Presented to the emergency room and noted to be in atrial fibrillation. Cardiology asked to evaluate.   (Not in a hospital admission)  Allergies  Allergen Reactions  . Phenazopyridine Hcl Other (See Comments)    unknown  . Septra [Sulfamethoxazole-Tmp Ds] Other (See Comments)    unknown    Past Medical History  Diagnosis Date  . Hypertension   . Gout   . Hard of hearing   . Anemia   . AAA (abdominal aortic aneurysm)   . Depression   . Hyperplasia, prostate   . Asthma   . H. pylori infection   . PUD (peptic ulcer disease)   . Hiatal hernia   . Renal failure, acute 11/08/2012    Past Surgical History  Procedure Laterality Date  . Esophagogastroduodenoscopy  06/13/2008,12/31/12    History   Social History  . Marital Status: Married    Spouse Name: N/A    Number of Children: 33  . Years of Education: N/A   Occupational History  . Not on file.   Social History Main Topics  . Smoking status: Former Smoker    Quit date: 02/17/2002  . Smokeless tobacco: Never Used     Comment: Quit seven years ago   . Alcohol Use: No  . Drug Use: No  . Sexual Activity: Not on file   Other Topics Concern  . Not on file   Social History Narrative  . No narrative on file    Family History  Problem Relation  Age of Onset  . Colon cancer Neg Hx     ROS:  Recent nonproductive cough but no URI symptoms and no fevers or chills, hemoptysis, dysphasia, odynophagia, melena, hematochezia, dysuria, hematuria, rash, seizure activity, PND, pedal edema, claudication. Remaining systems are negative.  Physical Exam:   Blood pressure 133/68, pulse 103, temperature 97.7 F (36.5 C), temperature source Oral, resp. rate 26, SpO2 98.00%.  General:  Well developed/well nourished in mild distress Skin warm/dry; tattoos Patient anxious appearing No peripheral clubbing Back-normal HEENT-normal/normal eyelids Neck supple/normal carotid upstroke bilaterally; no bruits; no JVD; no thyromegaly chest - mild basilar crackles CV - irregular and tachycardic/normal S1 and S2; no murmurs, rubs or gallops;  PMI nondisplaced Abdomen -Mild epigastric tenderness to palpation/ND, no HSM, no mass, + bowel sounds, no bruit 2+ femoral pulses, no bruits Ext-no edema, chords, 2+ DP Neuro-grossly nonfocal  ECG atrial fibrillation, left axis deviation, nonspecific ST changes.  Results for orders placed during the hospital encounter of 04/28/13 (from the past 48 hour(s))  CBC WITH DIFFERENTIAL     Status: Abnormal   Collection Time    04/28/13 10:50 AM      Result Value Ref Range   WBC 9.3  4.0 - 10.5 K/uL   RBC 4.41  4.22 - 5.81 MIL/uL   Hemoglobin 13.0  13.0 - 17.0 g/dL   HCT 38.1 (*)  39.0 - 52.0 %   MCV 86.4  78.0 - 100.0 fL   MCH 29.5  26.0 - 34.0 pg   MCHC 34.1  30.0 - 36.0 g/dL   RDW 14.3  11.5 - 15.5 %   Platelets 225  150 - 400 K/uL   Neutrophils Relative % 76  43 - 77 %   Neutro Abs 7.1  1.7 - 7.7 K/uL   Lymphocytes Relative 15  12 - 46 %   Lymphs Abs 1.4  0.7 - 4.0 K/uL   Monocytes Relative 6  3 - 12 %   Monocytes Absolute 0.6  0.1 - 1.0 K/uL   Eosinophils Relative 2  0 - 5 %   Eosinophils Absolute 0.2  0.0 - 0.7 K/uL   Basophils Relative 0  0 - 1 %   Basophils Absolute 0.0  0.0 - 0.1 K/uL  COMPREHENSIVE  METABOLIC PANEL     Status: Abnormal   Collection Time    04/28/13 10:50 AM      Result Value Ref Range   Sodium 140  137 - 147 mEq/L   Potassium 3.6 (*) 3.7 - 5.3 mEq/L   Chloride 101  96 - 112 mEq/L   CO2 21  19 - 32 mEq/L   Glucose, Bld 129 (*) 70 - 99 mg/dL   BUN 24 (*) 6 - 23 mg/dL   Creatinine, Ser 1.42 (*) 0.50 - 1.35 mg/dL   Calcium 9.4  8.4 - 10.5 mg/dL   Total Protein 7.5  6.0 - 8.3 g/dL   Albumin 3.5  3.5 - 5.2 g/dL   AST 25  0 - 37 U/L   ALT 15  0 - 53 U/L   Alkaline Phosphatase 75  39 - 117 U/L   Total Bilirubin 0.5  0.3 - 1.2 mg/dL   GFR calc non Af Amer 44 (*) >90 mL/min   GFR calc Af Amer 51 (*) >90 mL/min   Comment: (NOTE)     The eGFR has been calculated using the CKD EPI equation.     This calculation has not been validated in all clinical situations.     eGFR's persistently <90 mL/min signify possible Chronic Kidney     Disease.  PRO B NATRIURETIC PEPTIDE     Status: Abnormal   Collection Time    04/28/13 10:50 AM      Result Value Ref Range   Pro B Natriuretic peptide (BNP) 2543.0 (*) 0 - 450 pg/mL  PROTIME-INR     Status: None   Collection Time    04/28/13 10:50 AM      Result Value Ref Range   Prothrombin Time 12.3  11.6 - 15.2 seconds   INR 0.93  0.00 - 1.49  I-STAT TROPOININ, ED     Status: Abnormal   Collection Time    04/28/13 11:13 AM      Result Value Ref Range   Troponin i, poc 0.21 (*) 0.00 - 0.08 ng/mL   Comment NOTIFIED PHYSICIAN     Comment 3            Comment: Due to the release kinetics of cTnI,     a negative result within the first hours     of the onset of symptoms does not rule out     myocardial infarction with certainty.     If myocardial infarction is still suspected,     repeat the test at appropriate intervals.    Dg Chest Portable 1 View  04/28/2013   CLINICAL DATA:  Shortness of breath.  EXAM: PORTABLE CHEST - 1 VIEW  COMPARISON:  DG CHEST 2 VIEW dated 11/08/2012  FINDINGS: The lungs are adequately inflated. The  interstitial markings are increased diffusely. The cardiopericardial silhouette is enlarged. Its borders are somewhat indistinct. The central pulmonary vascularity is prominent but definite cephalization is not demonstrated. There is no pleural effusion or pneumothorax. The mediastinum is normal in width. There is calcification in the wall of the aortic arch. The observed portions of the bony structures exhibit no acute abnormalities.  IMPRESSION: Increased interstitial markings bilaterally are worrisome for interstitial edema of cardiac or noncardiac calls. One cannot exclude interstitial pneumonia in the appropriate clinical setting. A followup PA and lateral chest x-ray would be of value when the patient can tolerate the procedure.   Electronically Signed   By: David  Martinique   On: 04/28/2013 11:39    Assessment/Plan 1 acute diastolic congestive heart failure-the patient appears to have mild CHF on examination. His chest x-ray shows pulmonary edema. We'll begin gentle diuresis with Lasix 40 mg IV twice a day. Follow renal function closely. I wonder if this may be from his atrial fibrillation.  2 atrial fibrillation-duration unknown. There is no prior electrocardiogram for comparison. Previous rhythm strips from 2010 showed sinus rhythm. Plan low-dose beta blocker for rate control. Check echocardiogram for LV function and TSH. Embolic risk factors of age greater than 59, hypertension, and CHF. Add IV heparin. He will ultimately need long-term anticoagulation. If he does not improve with medical therapy would proceed with TEE guided cardioversion once all procedures complete. 3 chronic stage III renal failure-follow renal function closely with diuresis. 4 non-ST elevation myocardial infarction-the patient's troponin is mildly elevated. He is having epigastric tightness of uncertain etiology. Troponin may be elevated from renal insufficiency in combination with atrial fibrillation and congestive heart failure.  Continue to cycle enzymes. Treat with aspirin, beta blocker and heparin. Add statin. If no clear trend up would plan nuclear study for risk stratification. 5 hypertension-follow blood pressure and adjust regimen as needed. 6 history of abdominal aortic aneurysm.  Kirk Ruths MD 04/28/2013, 1:52 PM

## 2013-04-28 NOTE — ED Notes (Addendum)
Pt finished breathing tx. Denies any relief from SOB. Pt reports intermittent dizziness, but denies chest pain at this time. States "I just feel really weak." Family at bedside.

## 2013-04-28 NOTE — ED Notes (Signed)
Ordered Heart Healthy tray 

## 2013-04-28 NOTE — ED Notes (Signed)
MD Yao at bedside 

## 2013-04-28 NOTE — ED Notes (Addendum)
CRITICAL VALUE ALERT  Critical value received:  Troponin 0.63  Date of notification:  04/28/2013  Time of notification:  1649  Critical value read back: Yes  Nurse who received alert:  Christin Bach   MD notified (1st page):  Md Crenshaw  Time of first page:  1650   Responding MD:  Md [name]  Time MD responded:  [phone]

## 2013-04-28 NOTE — ED Notes (Signed)
Pt RR decreased. States "I feel a little better." Md Yao at bedside; will hold BiPap at this time.

## 2013-04-28 NOTE — ED Notes (Signed)
Pt c/o sob and pain with coughing x 4 days.  Also c/o nausea and epigastric "tightness".  Sats of 96 with rr of 30.

## 2013-04-28 NOTE — ED Notes (Signed)
Results of troponin given to Dr. Yao 

## 2013-04-28 NOTE — ED Notes (Signed)
Pt eating dinner. VSS. Family at bedside.

## 2013-04-28 NOTE — ED Notes (Signed)
Pt continues to deny chest pain. States "I just feel really tired. I want to sleep, but I just can't. I feel short of breath." Pt remains 99% on monitor. RR at 30.

## 2013-04-28 NOTE — Progress Notes (Signed)
Pt came from ED after shift change. Nurse assistant got pt settled in bed. CHG bath given. Called central telemetry and notified pt in room. Pt on two drips. Gave report to night nurse and informed night nurse that pt was on drips and pt does not speak english. Ed nurse notified that daughter was with pt but daughter did not come to floor.

## 2013-04-28 NOTE — Progress Notes (Signed)
ANTICOAGULATION CONSULT NOTE - Initial Consult  Pharmacy Consult for heparin Indication: chest pain/ACS and atrial fibrillation  Allergies  Allergen Reactions  . Phenazopyridine Hcl Other (See Comments)    unknown  . Septra [Sulfamethoxazole-Tmp Ds] Other (See Comments)    unknown    Patient Measurements:   Heparin Dosing Weight: 65.9kg  Vital Signs: Temp: 97.7 F (36.5 C) (03/19 1035) Temp src: Oral (03/19 1035) BP: 111/63 mmHg (03/19 1450) Pulse Rate: 97 (03/19 1450)  Labs:  Recent Labs  04/28/13 1050  HGB 13.0  HCT 38.1*  PLT 225  LABPROT 12.3  INR 0.93  CREATININE 1.42*    The CrCl is unknown because both a height and weight (above a minimum accepted value) are required for this calculation.   Medical History: Past Medical History  Diagnosis Date  . Hypertension   . Gout   . Hard of hearing   . Anemia   . AAA (abdominal aortic aneurysm)   . Depression   . Hyperplasia, prostate   . Asthma   . H. pylori infection   . PUD (peptic ulcer disease)   . Hiatal hernia   . Renal failure, acute 11/08/2012    Medications:  Infusions:  . heparin    . heparin    . nitroGLYCERIN 10 mcg/min (04/28/13 1244)    Assessment: 86 yom presented to the ED with SOB. Troponin is elevated and pt also noted to be in afib. To start IV heparin for anticoagulation. He is not on any anticoagulation PTA. Baseline INR and CBC are WNL.   Goal of Therapy:  Heparin level 0.3-0.7 units/ml Monitor platelets by anticoagulation protocol: Yes   Plan:  1. Heparin bolus 4000 units IV x 1 2. Heparin gtt 900 units/hr 3. Check an 8 hour heparin level 4. Daily heparin level and CBC 5. F/u plans for oral anticoagulation  Ardice Boyan, Drake Leach 04/28/2013,3:06 PM

## 2013-04-28 NOTE — ED Notes (Signed)
Admitting MD at bedside.

## 2013-04-28 NOTE — ED Provider Notes (Addendum)
CSN: [id number]     Arrival date & time 04/28/13  1025 History   First MD Initiated Contact with Patient 04/28/13 1032     Chief Complaint  Patient presents with  . Shortness of Breath     (Consider location/radiation/quality/duration/timing/severity/associated sxs/prior Treatment) The history is provided by the patient. The history is limited by a language barrier. A language interpreter was used.  [name] is a 80 y.o. male hx of HTN, AAA, asthma, PUD here with chest pain, cough, SOB. Progressively worsening shortness of breath especially with exertion and laying down for the last 5 days. Also has some chest pain and its associated with coughing. Also has some epigastric pain as well. He noticed increasing leg swelling. Also has some paroxysmal dyspnea. Denies any fevers or chills.    Past Medical History  Diagnosis Date  . Hypertension   . Gout   . Hard of hearing   . Anemia   . AAA (abdominal aortic aneurysm)   . Depression   . Hyperplasia, prostate   . Asthma   . H. pylori infection   . PUD (peptic ulcer disease)   . Hiatal hernia   . Renal failure, acute 11/08/2012   Past Surgical History  Procedure Laterality Date  . Esophagogastroduodenoscopy  06/13/2008,12/31/12   Family History  Problem Relation Age of Onset  . Colon cancer Neg Hx    History  Substance Use Topics  . Smoking status: Former Smoker    Quit date: 02/17/2002  . Smokeless tobacco: Never Used     Comment: Quit seven years ago   . Alcohol Use: No    Review of Systems  Respiratory: Positive for shortness of breath.   All other systems reviewed and are negative.      Allergies  Phenazopyridine hcl and Septra  Home Medications   Current Outpatient Rx  Name  Route  Sig  Dispense  Refill  . allopurinol (ZYLOPRIM) 300 MG tablet   Oral   Take 300 mg by mouth daily.         . colchicine 0.6 MG tablet   Oral   Take 1 tablet (0.6 mg total) by mouth 2 (two) times daily.   10  tablet   0   . dicyclomine (BENTYL) 20 MG tablet   Oral   Take 20 mg by mouth 4 (four) times daily.         [name] esomeprazole (NEXIUM) 40 MG capsule   Oral   Take 40 mg by mouth daily before breakfast.           BP 127/74  Pulse 89  Temp(Src) 97.7 F (36.5 C) (Oral)  Resp 26  SpO2 100% Physical Exam  Nursing note and vitals reviewed. Constitutional: He is oriented to person, place, and time.  Tachypneic, uncomfortable   HENT:  Head: Normocephalic.  Mouth/Throat: Oropharynx is clear and moist.  Eyes: Pupils are equal, round, and reactive to light.  Neck: Normal range of motion. Neck supple.  Cardiovascular: Normal rate, regular rhythm and normal heart sounds.   Pulmonary/Chest:  Tachypneic, crackles bilateral bases   Abdominal: Soft. Bowel sounds are normal. He exhibits no distension. There is no tenderness. There is no rebound.  Musculoskeletal: Normal range of motion.  2+ edema bilateral legs   Neurological: He is alert and oriented to person, place, and time. No cranial nerve deficit. Coordination normal.  Skin: Skin is warm and dry.  Psychiatric: He has a normal mood and affect. His behavior is normal.  Judgment and thought content normal.    ED Course  Procedures (including critical care time)  CRITICAL CARE Performed by: Silverio LayYAO, Elina Streng   Total critical care time: 30 min   Critical care time was exclusive of separately billable procedures and treating other patients.  Critical care was necessary to treat or prevent imminent or life-threatening deterioration.  Critical care was time spent personally by me on the following activities: development of treatment plan with patient and/or surrogate as well as nursing, discussions with consultants, evaluation of patient's response to treatment, examination of patient, obtaining history from patient or surrogate, ordering and performing treatments and interventions, ordering and review of laboratory studies, ordering and  review of radiographic studies, pulse oximetry and re-evaluation of patient's condition.   Labs Review Labs Reviewed  CBC WITH DIFFERENTIAL - Abnormal; Notable for the following:    HCT 38.1 (*)    All other components within normal limits  COMPREHENSIVE METABOLIC PANEL - Abnormal; Notable for the following:    Potassium 3.6 (*)    Glucose, Bld 129 (*)    BUN 24 (*)    Creatinine, Ser 1.42 (*)    GFR calc non Af Amer 44 (*)    GFR calc Af Amer 51 (*)    All other components within normal limits  PRO B NATRIURETIC PEPTIDE - Abnormal; Notable for the following:    Pro B Natriuretic peptide (BNP) 2543.0 (*)    All other components within normal limits  I-STAT TROPOININ, ED - Abnormal; Notable for the following:    Troponin i, poc 0.21 (*)    All other components within normal limits  PROTIME-INR   Imaging Review Dg Chest Portable 1 View  04/28/2013   CLINICAL DATA:  Shortness of breath.  EXAM: PORTABLE CHEST - 1 VIEW  COMPARISON:  DG CHEST 2 VIEW dated 11/08/2012  FINDINGS: The lungs are adequately inflated. The interstitial markings are increased diffusely. The cardiopericardial silhouette is enlarged. Its borders are somewhat indistinct. The central pulmonary vascularity is prominent but definite cephalization is not demonstrated. There is no pleural effusion or pneumothorax. The mediastinum is normal in width. There is calcification in the wall of the aortic arch. The observed portions of the bony structures exhibit no acute abnormalities.  IMPRESSION: Increased interstitial markings bilaterally are worrisome for interstitial edema of cardiac or noncardiac calls. One cannot exclude interstitial pneumonia in the appropriate clinical setting. A followup PA and lateral chest x-ray would be of value when the patient can tolerate the procedure.   Electronically Signed   By: Aubriel Khanna  [place]   On: 04/28/2013 11:39     EKG Interpretation   Date/Time:  Thursday April 28 2013 10:30:30  EDT Ventricular Rate:  102 PR Interval:    QRS Duration: 112 QT Interval:  376 QTC Calculation: 490 R Axis:   -25 Text Interpretation:  Atrial fibrillation with rapid ventricular response  with a competing junctional pacemaker Incomplete left bundle branch block  Abnormal ECG No significant change since last tracing Confirmed by Verena Shawgo   MD, Pamela Intrieri ([id number]) on 04/28/2013 10:34:01 AM      MDM   Final diagnoses:  NSTEMI (non-ST elevated myocardial infarction)  CHF (congestive heart failure)   [name] is a 80 y.o. male here with SOB. Likely new onset CHF. Also has new onset afib. Will diurese and place on nitro drip. Will likely need cardiology admission.   2:38 PM Cardiology recommend heparin drip. Given lasix. Cards will admit for new onset CHF, afib, NSTEMI.  Richardean Canal, MD 04/28/13 1438  Richardean Canal, MD 04/28/13 [phone]

## 2013-04-28 NOTE — ED Notes (Signed)
Attempted to call report x 1  

## 2013-04-29 DIAGNOSIS — N183 Chronic kidney disease, stage 3 unspecified: Secondary | ICD-10-CM | POA: Diagnosis present

## 2013-04-29 DIAGNOSIS — R0600 Dyspnea, unspecified: Secondary | ICD-10-CM | POA: Diagnosis present

## 2013-04-29 DIAGNOSIS — N179 Acute kidney failure, unspecified: Secondary | ICD-10-CM | POA: Diagnosis present

## 2013-04-29 DIAGNOSIS — I739 Peripheral vascular disease, unspecified: Secondary | ICD-10-CM | POA: Diagnosis present

## 2013-04-29 DIAGNOSIS — I509 Heart failure, unspecified: Secondary | ICD-10-CM | POA: Diagnosis present

## 2013-04-29 LAB — CBC
HEMATOCRIT: 36.4 % — AB (ref 39.0–52.0)
Hemoglobin: 12.3 g/dL — ABNORMAL LOW (ref 13.0–17.0)
MCH: 28.9 pg (ref 26.0–34.0)
MCHC: 33.8 g/dL (ref 30.0–36.0)
MCV: 85.6 fL (ref 78.0–100.0)
Platelets: 231 10*3/uL (ref 150–400)
RBC: 4.25 MIL/uL (ref 4.22–5.81)
RDW: 14.4 % (ref 11.5–15.5)
WBC: 7.6 10*3/uL (ref 4.0–10.5)

## 2013-04-29 LAB — HEPARIN LEVEL (UNFRACTIONATED): HEPARIN UNFRACTIONATED: 0.37 [IU]/mL (ref 0.30–0.70)

## 2013-04-29 LAB — BASIC METABOLIC PANEL
BUN: 27 mg/dL — ABNORMAL HIGH (ref 6–23)
CHLORIDE: 100 meq/L (ref 96–112)
CO2: 22 meq/L (ref 19–32)
Calcium: 9 mg/dL (ref 8.4–10.5)
Creatinine, Ser: 1.69 mg/dL — ABNORMAL HIGH (ref 0.50–1.35)
GFR calc non Af Amer: 35 mL/min — ABNORMAL LOW (ref >90)
GFR, EST AFRICAN AMERICAN: 41 mL/min — AB (ref >90)
Glucose, Bld: 119 mg/dL — ABNORMAL HIGH (ref 70–99)
POTASSIUM: 3.8 meq/L (ref 3.7–5.3)
Sodium: 139 mEq/L (ref 137–147)

## 2013-04-29 LAB — TROPONIN I
Troponin I: 0.43 ng/mL (ref <0.30)
Troponin I: 0.39 ng/mL (ref <0.30)

## 2013-04-29 LAB — LIPID PANEL
CHOL/HDL RATIO: 5.3 ratio
Cholesterol: 206 mg/dL — ABNORMAL HIGH (ref 0–200)
HDL: 39 mg/dL — ABNORMAL LOW (ref >39)
LDL Cholesterol: 144 mg/dL — ABNORMAL HIGH (ref 0–99)
TRIGLYCERIDES: 114 mg/dL (ref <150)
VLDL: 23 mg/dL (ref 0–40)

## 2013-04-29 MED ORDER — FUROSEMIDE 40 MG PO TABS
40.0000 mg | ORAL_TABLET | Freq: Every day | ORAL | Status: DC
  Administered 2013-04-29 – 2013-04-30 (×2): 40 mg via ORAL
  Filled 2013-04-29 (×3): qty 1

## 2013-04-29 MED ORDER — INFLUENZA VAC SPLIT QUAD 0.5 ML IM SUSP: 0.5000 mL | INTRAMUSCULAR | Status: DC | PRN

## 2013-04-29 MED ORDER — ALUM & MAG HYDROXIDE-SIMETH 200-200-20 MG/5ML PO SUSP
30.0000 mL | ORAL | Status: DC | PRN
  Administered 2013-04-29 – 2013-05-01 (×3): 30 mL via ORAL
  Filled 2013-04-29 (×3): qty 30

## 2013-04-29 MED ORDER — ZOLPIDEM TARTRATE 5 MG PO TABS
5.0000 mg | ORAL_TABLET | Freq: Every evening | ORAL | Status: DC | PRN
  Administered 2013-04-29 – 2013-05-01 (×3): 5 mg via ORAL
  Filled 2013-04-29 (×3): qty 1

## 2013-04-29 MED ORDER — FUROSEMIDE 10 MG/ML IJ SOLN
40.0000 mg | Freq: Once | INTRAMUSCULAR | Status: AC
  Filled 2013-04-29: qty 4

## 2013-04-29 MED ORDER — ISOSORBIDE MONONITRATE ER 30 MG PO TB24
30.0000 mg | ORAL_TABLET | Freq: Every day | ORAL | Status: DC
  Administered 2013-04-29 – 2013-04-30 (×2): 30 mg via ORAL
  Filled 2013-04-29 (×3): qty 1

## 2013-04-29 MED ORDER — DOPAMINE-DEXTROSE 3.2-5 MG/ML-% IV SOLN
2.0000 ug/kg/min | INTRAVENOUS | Status: DC
  Administered 2013-04-29: 2 ug/kg/min via INTRAVENOUS
  Filled 2013-04-29: qty 250

## 2013-04-29 MED ORDER — COLCHICINE 0.6 MG PO TABS
0.3000 mg | ORAL_TABLET | Freq: Every day | ORAL | Status: DC
  Administered 2013-04-29 – 2013-05-11 (×11): 0.3 mg via ORAL
  Filled 2013-04-29 (×14): qty 0.5

## 2013-04-29 MED ORDER — ONDANSETRON HCL 4 MG/2ML IJ SOLN
4.0000 mg | Freq: Four times a day (QID) | INTRAMUSCULAR | Status: DC | PRN
  Administered 2013-04-29 – 2013-05-01 (×4): 4 mg via INTRAVENOUS
  Filled 2013-04-29 (×4): qty 2

## 2013-04-29 NOTE — Progress Notes (Signed)
Rt arm B/P 82/49 () HR 92 A-Fib. Lt arm B/P 79/48 (56) HR 88 RR 30 02 Sat 97% 2 LNC. On call MD paged. We will  inform him of same when he call back. Patient remain alert, awake and oriented. Family at bedside.

## 2013-04-29 NOTE — Progress Notes (Addendum)
ANTICOAGULATION CONSULT NOTE - Follow Up Consult  Pharmacy Consult for Heparin  Indication: chest pain/ACS  Allergies  Allergen Reactions  . Phenazopyridine Hcl Other (See Comments)    unknown  . Septra [Sulfamethoxazole-Tmp Ds] Other (See Comments)    unknown    Patient Measurements: Height: 5\' 2"  (157.5 cm) Weight: 148 lb (67.132 kg) IBW/kg (Calculated) : 54.6 Vital Signs: Temp: 98 F (36.7 C) (03/19 2340) Temp src: Oral (03/19 2340) BP: 113/67 mmHg (03/19 2340) Pulse Rate: 72 (03/19 2340)  Labs:  Recent Labs  04/28/13 1050 04/28/13 1606 04/28/13 2304  HGB 13.0  --   --   HCT 38.1*  --   --   PLT 225  --   --   LABPROT 12.3  --   --   INR 0.93  --   --   HEPARINUNFRC  --   --  0.54  CREATININE 1.42*  --   --   TROPONINI  --  0.63*  --     Estimated Creatinine Clearance: 32.6 ml/min (by C-G formula based on Cr of 1.42).   Medications:  Heparin 900 units/hr  Assessment: 80 y/o M on heparin for elevated trop and afib. First HL is 0.54. Other labs as above.   Goal of Therapy:  Heparin level 0.3-0.7 units/ml Monitor platelets by anticoagulation protocol: Yes   Plan:  -Continue heparin at 900 units/hr  -AM HL -Daily CBC/HL -Monitor for bleeding  Abran Duke 04/29/2013,12:10 AM  04/29/2013 5:14 AM HL this AM is 0.37 -Continue heparin at 900 units/hr -Daily CBC/HL -F/U MD plans for heparin  Wilmer Floor, PharmD

## 2013-04-29 NOTE — Progress Notes (Signed)
Dr Mayford Knife returned the page, order received to start Dopamine at renal dose @2  mcg/kg/min and to inform MD if B/P does not Improve. We will continue to monitor.

## 2013-04-29 NOTE — Progress Notes (Signed)
    Subjective:  Dyspnea with minimal exertion  Objective:  Vital Signs in the last 24 hours: Temp:  [97.6 F (36.4 C)-98.2 F (36.8 C)] 98.2 F (36.8 C) (03/20 0829) Pulse Rate:  [58-110] 101 (03/20 0902) Resp:  [17-41] 20 (03/20 0829) BP: (109-157)/(58-100) 117/71 mmHg (03/20 0829) SpO2:  [96 %-100 %] 97 % (03/20 0829) Weight:  [148 lb (67.132 kg)] 148 lb (67.132 kg) (03/20 0407)  Intake/Output from previous day:  Intake/Output Summary (Last 24 hours) at 04/29/13 0935 Last data filed at 04/28/13 2300  Gross per 24 hour  Intake     12 ml  Output    600 ml  Net   -588 ml    Physical Exam: General appearance: alert, cooperative and no distress Lungs: basilar carckles Lt Heart: irregularly irregular rhythm   Rate: 100  Rhythm: atrial fibrillation  Lab Results:  Recent Labs  04/28/13 1050 04/29/13 0326  WBC 9.3 7.6  HGB 13.0 12.3*  PLT 225 231    Recent Labs  04/28/13 1050 04/29/13 0326  NA 140 139  K 3.6* 3.8  CL 101 100  CO2 21 22  GLUCOSE 129* 119*  BUN 24* 27*  CREATININE 1.42* 1.69*    Recent Labs  04/28/13 2304 04/29/13 0326  TROPONINI 0.43* 0.39*    Recent Labs  04/28/13 1050  INR 0.93    Imaging: Imaging results have been reviewed  Cardiac Studies:  Assessment/Plan:   Principal Problem:   Dyspnea Active Problems:   Atrial fibrillation- unknown duration   Acute CHF- presume secondary to AF and diastolic dysfunction. (Nl LVF 2010)   NSTEMI (non-ST elevated myocardial infarction)   Chronic renal insufficiency, stage III (moderate)   Hypertension   PVD (peripheral vascular disease) 3.5cm AAA Aug 2014    PLAN: MD to see- will keep NPO for now, continue IV diuretics, Heparin, IV NTG.   Corine Shelter PA-C Beeper [phone] 04/29/2013, 9:35 AM Agree with above assessment.  We will resume his diet now.  Continue IV heparin.  Troponins are trending downward and his EKG today shows atrial fibrillation but no acute ischemic changes.   We will switch him to oral nitrates.  Two-dimensional echo not yet done.  Chest x-ray reviewed and does show cardiomegaly with a left ventricular configuration as well as evidence of CHF.  His renal function is worse today and we will cut back on his Lasix to just 40 mg once a day.  Continue treatment for his decompensated heart failure over the weekend and consider TEE cardioversion Monday for atrial fibrillation of unknown duration.  Consider chest PA and lateral over the weekend.

## 2013-04-29 NOTE — Progress Notes (Signed)
Utilization review completed. Shiryl Ruddy, RN, BSN. 

## 2013-04-30 ENCOUNTER — Other Ambulatory Visit: Payer: Self-pay

## 2013-04-30 LAB — CBC
HEMATOCRIT: 36.4 % — AB (ref 39.0–52.0)
Hemoglobin: 12.6 g/dL — ABNORMAL LOW (ref 13.0–17.0)
MCH: 29.8 pg (ref 26.0–34.0)
MCHC: 34.6 g/dL (ref 30.0–36.0)
MCV: 86.1 fL (ref 78.0–100.0)
PLATELETS: 241 10*3/uL (ref 150–400)
RBC: 4.23 MIL/uL (ref 4.22–5.81)
RDW: 14.4 % (ref 11.5–15.5)
WBC: 9.4 10*3/uL (ref 4.0–10.5)

## 2013-04-30 LAB — HEPARIN LEVEL (UNFRACTIONATED): Heparin Unfractionated: 0.34 IU/mL (ref 0.30–0.70)

## 2013-04-30 NOTE — Progress Notes (Signed)
Dopamine gtt effective B/P112/48 (66) HR 74 RR 23 02 sat 94%  2 L [place]. We will continue to monitor.

## 2013-04-30 NOTE — Progress Notes (Signed)
  80 year old male with past medical history of hypertension, abdominal aortic aneurysm admitted with atrial fibrillation, congestive heart failure and epigastric pain. Note patient does not speak [place]. Troponin mildly elevated. Patient converted to sinus 3/21. `  Subjective:  Language barrier; some epigastic pain   Objective:  Filed Vitals:   04/30/13 0947 04/30/13 1000 04/30/13 1100 04/30/13 1210  BP: 123/63 119/70 76/56 93/63   Pulse: 79 78 66 78  Temp:    97.9 F (36.6 C)  TempSrc:    Oral  Resp: 23 20 21 24   Height:      Weight:      SpO2: 99% 97% 98% 95%    Intake/Output from previous day:  Intake/Output Summary (Last 24 hours) at 04/30/13 1227 Last data filed at 04/30/13 1100  Gross per 24 hour  Intake 183.51 ml  Output      0 ml  Net 183.51 ml    Physical Exam: Physical exam: Well-developed well-nourished in no acute distress.  Skin is warm and dry.  HEENT is normal.  Neck is supple.  Chest is clear to auscultation with normal expansion.  Cardiovascular exam is regular rate and rhythm.  Abdominal exam nontender or distended. No masses palpated. Extremities show no edema. neuro grossly intact    Lab Results: Basic Metabolic Panel:  Recent Labs  57/84/69 1050 04/29/13 0326  NA 140 139  K 3.6* 3.8  CL 101 100  CO2 21 22  GLUCOSE 129* 119*  BUN 24* 27*  CREATININE 1.42* 1.69*  CALCIUM 9.4 9.0   CBC:  Recent Labs  04/28/13 1050 04/29/13 0326 04/30/13 0248  WBC 9.3 7.6 9.4  NEUTROABS 7.1  --   --   HGB 13.0 12.3* 12.6*  HCT 38.1* 36.4* 36.4*  MCV 86.4 85.6 86.1  PLT 225 231 241   Cardiac Enzymes:  Recent Labs  04/28/13 1606 04/28/13 2304 04/29/13 0326  TROPONINI 0.63* 0.43* 0.39*     Assessment/Plan:  1 acute diastolic congestive heart failure-improved. He is hypotensive this morning requiring low-dose dopamine. Hold Lasix for now. Atrial fibrillation may have contributed to his congestive heart failure.  2 atrial  fibrillation-patient has converted to sinus rhythm this morning. TSH normal. Await echocardiogram. Embolic risk factors of age greater than 10, hypertension, and CHF. Continue IV heparin. He will ultimately need long-term anticoagulation.  3 chronic stage III renal failure-follow renal function closely with diuresis. Creatinine mildly increased this morning. Hold Lasix. 4 non-ST elevation myocardial infarction-the patient's troponin is mildly elevated but no clear trend up. Troponin may be elevated from renal insufficiency in combination with atrial fibrillation and congestive heart failure. Continue aspirin, heparin and statin. Resume metoprolol when blood pressure improves. Plan nuclear study for risk stratification. 5 hypertension-blood pressure is reduced this morning. Hold Lasix, Imdur and metoprolol. Wean dopamine to off. Resume metoprolol as blood pressure improves. 6 history of abdominal aortic aneurysm.   Olga Millers 04/30/2013, 12:27 PM

## 2013-04-30 NOTE — Progress Notes (Signed)
ANTICOAGULATION CONSULT NOTE - Follow Up Consult  Pharmacy Consult for Heparin  Indication: chest pain/ACS  Allergies  Allergen Reactions  . Phenazopyridine Hcl Other (See Comments)    unknown  . Septra [Sulfamethoxazole-Tmp Ds] Other (See Comments)    unknown    Patient Measurements: Height: 5\' 2"  (157.5 cm) Weight: 148 lb (67.132 kg) IBW/kg (Calculated) : 54.6 Vital Signs: Temp: 98 F (36.7 C) (03/20 2355) Temp src: Oral (03/20 2355) BP: 122/61 mmHg (03/21 0548) Pulse Rate: 95 (03/21 0548)  Labs:  Recent Labs  04/28/13 1050 04/28/13 1606 04/28/13 2304 04/29/13 0326 04/30/13 0248  HGB 13.0  --   --  12.3* 12.6*  HCT 38.1*  --   --  36.4* 36.4*  PLT 225  --   --  231 241  LABPROT 12.3  --   --   --   --   INR 0.93  --   --   --   --   HEPARINUNFRC  --   --  0.54 0.37 0.34  CREATININE 1.42*  --   --  1.69*  --   TROPONINI  --  0.63* 0.43* 0.39*  --     Estimated Creatinine Clearance: 27.4 ml/min (by C-G formula based on Cr of 1.69).   Medications:  Heparin 900 units/hr  Assessment: 80 y/o M on heparin for elevated trop and afib. First HL is 0.54. Today, HL is 0.34 and currently on the lower end of therapeutic.  Will keep current dose of heparin for now, and consider slight increase if HL continues to decrease for tomorrow.  Goal of Therapy:  Heparin level 0.3-0.7 units/ml Monitor platelets by anticoagulation protocol: Yes   Plan:  -Continue heparin at 900 units/hr  -AM HL -Daily CBC/HL -Monitor for bleeding   Anabel Bene, PharmD Clinical Pharmacist Pager: [phone]  Anabel Bene 04/30/2013,7:41 AM

## 2013-04-30 NOTE — Progress Notes (Signed)
Patient's EKG rhythm converted from A-Fib to Sinus rhythm with  1st degree AV block with occasional PVC's B/P 103/60, HR 92, RR 20, 02 sat 99% 02@3L  [place] . EKG done. . Dopamine infusing at 2 mch/kg/min.We will continue to monitor.

## 2013-04-30 NOTE — Progress Notes (Signed)
Zofran 4 mg IV administered for nausea. No emesis at this time.. We will continue to monitor.

## 2013-05-01 ENCOUNTER — Inpatient Hospital Stay (HOSPITAL_COMMUNITY): Payer: PRIVATE HEALTH INSURANCE

## 2013-05-01 ENCOUNTER — Other Ambulatory Visit (HOSPITAL_COMMUNITY): Payer: PRIVATE HEALTH INSURANCE

## 2013-05-01 ENCOUNTER — Encounter (HOSPITAL_COMMUNITY): Payer: Self-pay | Admitting: Neurology

## 2013-05-01 DIAGNOSIS — I059 Rheumatic mitral valve disease, unspecified: Secondary | ICD-10-CM

## 2013-05-01 DIAGNOSIS — I6350 Cerebral infarction due to unspecified occlusion or stenosis of unspecified cerebral artery: Secondary | ICD-10-CM

## 2013-05-01 DIAGNOSIS — I10 Essential (primary) hypertension: Secondary | ICD-10-CM

## 2013-05-01 LAB — BASIC METABOLIC PANEL
BUN: 36 mg/dL — ABNORMAL HIGH (ref 6–23)
CALCIUM: 8.9 mg/dL (ref 8.4–10.5)
CO2: 24 meq/L (ref 19–32)
CREATININE: 2.43 mg/dL — AB (ref 0.50–1.35)
Chloride: 97 mEq/L (ref 96–112)
GFR calc Af Amer: 27 mL/min — ABNORMAL LOW (ref >90)
GFR calc non Af Amer: 23 mL/min — ABNORMAL LOW (ref >90)
GLUCOSE: 99 mg/dL (ref 70–99)
Potassium: 4 mEq/L (ref 3.7–5.3)
Sodium: 138 mEq/L (ref 137–147)

## 2013-05-01 LAB — CBC
HCT: 36 % — ABNORMAL LOW (ref 39.0–52.0)
HEMOGLOBIN: 12.3 g/dL — AB (ref 13.0–17.0)
MCH: 29.4 pg (ref 26.0–34.0)
MCHC: 34.2 g/dL (ref 30.0–36.0)
MCV: 86.1 fL (ref 78.0–100.0)
Platelets: 211 10*3/uL (ref 150–400)
RBC: 4.18 MIL/uL — AB (ref 4.22–5.81)
RDW: 14.4 % (ref 11.5–15.5)
WBC: 6.7 10*3/uL (ref 4.0–10.5)

## 2013-05-01 LAB — HEPARIN LEVEL (UNFRACTIONATED): Heparin Unfractionated: 0.49 IU/mL (ref 0.30–0.70)

## 2013-05-01 MED ORDER — ALLOPURINOL 100 MG PO TABS
200.0000 mg | ORAL_TABLET | Freq: Every day | ORAL | Status: DC
  Administered 2013-05-02 – 2013-05-11 (×9): 200 mg via ORAL
  Filled 2013-05-01 (×10): qty 2

## 2013-05-01 NOTE — Progress Notes (Signed)
  Echocardiogram 2D Echocardiogram has been performed.  Arvil Chaco 05/01/2013, 2:12 PM

## 2013-05-01 NOTE — Progress Notes (Signed)
  80 year old male with past medical history of hypertension, abdominal aortic aneurysm admitted with atrial fibrillation, congestive heart failure and epigastric pain. Note patient does not speak [place]. Troponin mildly elevated. Patient converted to sinus 3/21.  Subjective:  Language barrier; Daughter states no chest pain and dyspnea improved; some nausea and abdominal pain   Objective:  Filed Vitals:   05/01/13 0300 05/01/13 0500 05/01/13 0700 05/01/13 0738  BP: 99/59 105/66 90/50 91/47   Pulse: 86 81 83 97  Temp:    98.3 F (36.8 C)  TempSrc:    Oral  Resp:    18  Height:      Weight:      SpO2: 97% 93% 92% 99%    Intake/Output from previous day:  Intake/Output Summary (Last 24 hours) at 05/01/13 0803 Last data filed at 05/01/13 0400  Gross per 24 hour  Intake 342.09 ml  Output    850 ml  Net -507.91 ml    Physical Exam: Physical exam: Well-developed well-nourished in no acute distress.  Skin is warm and dry.  HEENT is normal.  Neck is supple.  Chest is clear to auscultation with normal expansion.  Cardiovascular exam is regular rate and rhythm.  Abdominal exam nontender or distended. No masses palpated. Extremities show no edema. neuro grossly intact    Lab Results: Basic Metabolic Panel:  Recent Labs  16/10/96 0326 05/01/13 0410  NA 139 138  K 3.8 4.0  CL 100 97  CO2 22 24  GLUCOSE 119* 99  BUN 27* 36*  CREATININE 1.69* 2.43*  CALCIUM 9.0 8.9   CBC:  Recent Labs  04/28/13 1050  04/30/13 0248 05/01/13 0410  WBC 9.3  < > 9.4 6.7  NEUTROABS 7.1  --   --   --   HGB 13.0  < > 12.6* 12.3*  HCT 38.1*  < > 36.4* 36.0*  MCV 86.4  < > 86.1 86.1  PLT 225  < > 241 211  < > = values in this interval not displayed. Cardiac Enzymes:  Recent Labs  04/28/13 1606 04/28/13 2304 04/29/13 0326  TROPONINI 0.63* 0.43* 0.39*     Assessment/Plan:  1 acute diastolic congestive heart failure-improved. His BP has improved but renal function worse. Hold  Lasix for now. Atrial fibrillation may have contributed to his congestive heart failure.  2 atrial fibrillation-patient remains in sinus rhythm this morning. TSH normal. Await echocardiogram. Embolic risk factors of age greater than 65, hypertension, and CHF. Continue IV heparin. He will ultimately need long-term anticoagulation once it is clear he will not require additional procedures.  3 acute on chronic stage III renal failure-Renal function worse this morning. Hold Lasix. Repeat BMET in AM 4 non-ST elevation myocardial infarction-the patient's troponin is mildly elevated but no clear trend up. Troponin may be elevated from renal insufficiency in combination with atrial fibrillation and congestive heart failure. Continue aspirin, heparin and statin. Resume metoprolol when blood pressure improves. Cancel nuclear study this AM as he complains of nausea; reschedule for tomorrow AM. 5 hypertension-blood pressure is borderline. Continue to hold Lasix, Imdur and metoprolol. Resume metoprolol as blood pressure improves. 6 history of abdominal aortic aneurysm.   Olga Millers 05/01/2013, 8:03 AM

## 2013-05-01 NOTE — Progress Notes (Signed)
Noted changed in HR rhythm to Afib MD paged to be updated.Family reported changed in mental status.They report that he has change in mental status and speaking words that they cannot understand.Rapid Response called to evaluate.

## 2013-05-01 NOTE — Progress Notes (Addendum)
Per Dr. [name] - he has preliminarily reviewed echo and EF is 20-25% with global hypokinesis and akinesis of the basal inferior posterior wall, mod MR. Will cancel nuc for tomorrow and proceed with R/L cath when renal function improves. Add ACEI and BB later as BP and renal function allow. Full report to follow in Epic. Will discuss further with patient tomorrow with translation services.  Addendum: no need to be NPO after midnight for now. Will allow to eat.   Dariann Huckaba PA-C

## 2013-05-01 NOTE — Consult Note (Signed)
Referring Physician: Dr. [name]    Chief Complaint: New-onset slurred speech and right-sided weakness.  HPI: [name] is an 80 y.o. male with a history of heart failure, hypertension and abdominal aortic aneurysm was admitted on 04/28/2013 for management of atrial fibrillation and heart failure. Patient is currently on heparin drip for anticoagulation. At noon today his daughter noticed that he had difficulty feeding himself and she was unable to understand his speech. There is no previous history of stroke. NIH stroke score was 5. CT scan of his head showed no acute intracranial abnormality including no signs of acute infarction or acute stroke.  LSN: Noon on 05/01/2013 tPA Given: No: Anticoagulated on IV heparin; currently beyond time window for treatment consideration. MRankin: 2  Past Medical History  Diagnosis Date  . Hypertension   . Gout   . Hard of hearing   . Anemia   . AAA (abdominal aortic aneurysm)   . Depression   . Hyperplasia, prostate   . Asthma   . H. pylori infection   . PUD (peptic ulcer disease)   . Hiatal hernia   . Renal failure, acute 11/08/2012    Family History  Problem Relation Age of Onset  . Colon cancer Neg Hx      Medications: I have reviewed the patient's current medications.  ROS: Unobtainable due to language barrier.  Physical Examination: Blood pressure 135/67, pulse 42, temperature 97.7 F (36.5 C), temperature source Oral, resp. rate 23, height 5\' 2"  (1.575 m), weight 69.7 kg (153 lb 10.6 oz), SpO2 94.00%.  Neurologic Examination: Patient was alert and in no acute distress. He was able to recognize family members and was able to communicate by way of his daughter spoke [place]. Pupils were equal and reacted normally to light. Extraocular movements were full and conjugate. Visual fields were intact and normal to finger counting. No clear facial weakness was noted. Speech was moderately dysarthric according to his  daughter. Motor exam showed drift of right upper and lower extremities; normal strength of left extremities; normal muscle tone throughout. Deep tendon reflexes were trace to 1+ and symmetrical. Plantar responses were mute bilaterally. Responses to sensory testing were difficult to interpret due to difficulty with communication.  No results found.  Assessment: 80 y.o. male with multiple risk factors for stroke, including atrial fibrillation on anticoagulation, with probable acute left subcortical ischemic infarction.  Stroke Risk Factors - atrial fibrillation and hypertension  Plan: 1. HgbA1c, fasting lipid panel 2. MRI, MRA  of the brain without contrast 3. PT consult, OT consult, Speech consult 4. Echocardiogram 5. Carotid dopplers 6. Prophylactic therapy-continue heparin drip for now; long-term anticoagulation her Cardiology and Stroke Team. 7. Risk factor modification 8. Telemetry monitoring   C.R. [name], MD [facility] [phone]  05/01/2013, 8:07 PM

## 2013-05-01 NOTE — Significant Event (Signed)
Rapid Response Event Note  Overview:  Called to see patient for preliminary assessment. Family stating he is 'not himself.' Describe onset of garbled speech this afternoon (?time), and trouble swallowing at lunch time.    Initial Focused Assessment: Language barrier makes NIHSS assessment difficult. Able to score some items-see NIHSS documentation. Of note, does have right sided drift both upper and lower extremities. No appreciable sensory deficit that I can tell, nor ataxia out of proportion with weakness. Unable to score speech, but family states he is not understandable. Follows commands  Also of note: 80 years old and day 3 of hospitalization.    Interventions: MD has been called by Bedside RN. Patient is already on IV Heparin. Effective LSN seems to be lunchtime--approx 7 hours ago.   Event Summary:   at      at          Kristine Linea

## 2013-05-01 NOTE — Progress Notes (Signed)
ANTICOAGULATION CONSULT NOTE - Follow Up Consult  Pharmacy Consult for Heparin  Indication: chest pain/ACS  Allergies  Allergen Reactions  . Phenazopyridine Hcl Other (See Comments)    unknown  . Septra [Sulfamethoxazole-Tmp Ds] Other (See Comments)    unknown    Patient Measurements: Height: 5\' 2"  (157.5 cm) Weight: 153 lb 10.6 oz (69.7 kg) IBW/kg (Calculated) : 54.6 Vital Signs: Temp: 98.3 F (36.8 C) (03/22 0738) Temp src: Oral (03/22 0738) BP: 95/58 mmHg (03/22 0800) Pulse Rate: 78 (03/22 0800)  Labs:  Recent Labs  04/28/13 1050 04/28/13 1606  04/28/13 2304 04/29/13 0326 04/30/13 0248 05/01/13 0410  HGB 13.0  --   --   --  12.3* 12.6* 12.3*  HCT 38.1*  --   --   --  36.4* 36.4* 36.0*  PLT 225  --   --   --  231 241 211  LABPROT 12.3  --   --   --   --   --   --   INR 0.93  --   --   --   --   --   --   HEPARINUNFRC  --   --   < > 0.54 0.37 0.34 0.49  CREATININE 1.42*  --   --   --  1.69*  --  2.43*  TROPONINI  --  0.63*  --  0.43* 0.39*  --   --   < > = values in this interval not displayed.  Estimated Creatinine Clearance: 19.4 ml/min (by C-G formula based on Cr of 2.43).   Medications:  Heparin 900 units/hr  Assessment: 80 y/o M on heparin for elevated trop/NSTEMI and afib. Today, HL is 0.49 and currently therapeutic.  Will keep current dose of heparin for now, and adjust as appropriate with daily HL.  H/h low but stable, and plt wnl.  No bleeding noted.  Goal of Therapy:  Heparin level 0.3-0.7 units/ml Monitor platelets by anticoagulation protocol: Yes   Plan:  -Continue heparin at 900 units/hr  -AM HL -Daily CBC/HL -Monitor for bleeding   Anabel Bene, PharmD Clinical Pharmacist Pager: [phone]  Anabel Bene 05/01/2013,9:35 AM

## 2013-05-01 NOTE — Progress Notes (Signed)
Pt seen and examined. Pt appears confused, not speaking clearly and unable to move his right UE and Right LE on command. Concern for delirium vs CVA ( pt has risk factors and on anticoagulation) . Consulted neurology Dr Noel Christmas.

## 2013-05-02 DIAGNOSIS — I4891 Unspecified atrial fibrillation: Secondary | ICD-10-CM | POA: Diagnosis not present

## 2013-05-02 DIAGNOSIS — Z8673 Personal history of transient ischemic attack (TIA), and cerebral infarction without residual deficits: Secondary | ICD-10-CM | POA: Diagnosis present

## 2013-05-02 DIAGNOSIS — I6350 Cerebral infarction due to unspecified occlusion or stenosis of unspecified cerebral artery: Secondary | ICD-10-CM | POA: Diagnosis not present

## 2013-05-02 LAB — BASIC METABOLIC PANEL
BUN: 25 mg/dL — AB (ref 6–23)
CO2: 23 mEq/L (ref 19–32)
CREATININE: 1.87 mg/dL — AB (ref 0.50–1.35)
Calcium: 9 mg/dL (ref 8.4–10.5)
Chloride: 97 mEq/L (ref 96–112)
GFR, EST AFRICAN AMERICAN: 36 mL/min — AB (ref >90)
GFR, EST NON AFRICAN AMERICAN: 31 mL/min — AB (ref >90)
GLUCOSE: 119 mg/dL — AB (ref 70–99)
Potassium: 3.9 mEq/L (ref 3.7–5.3)
Sodium: 135 mEq/L — ABNORMAL LOW (ref 137–147)

## 2013-05-02 LAB — CBC
HEMATOCRIT: 38.9 % — AB (ref 39.0–52.0)
Hemoglobin: 13.7 g/dL (ref 13.0–17.0)
MCH: 30.2 pg (ref 26.0–34.0)
MCHC: 35.2 g/dL (ref 30.0–36.0)
MCV: 85.7 fL (ref 78.0–100.0)
Platelets: 236 10*3/uL (ref 150–400)
RBC: 4.54 MIL/uL (ref 4.22–5.81)
RDW: 14.1 % (ref 11.5–15.5)
WBC: 7.4 10*3/uL (ref 4.0–10.5)

## 2013-05-02 LAB — HEPARIN LEVEL (UNFRACTIONATED): Heparin Unfractionated: 0.32 IU/mL (ref 0.30–0.70)

## 2013-05-02 MED ORDER — CARVEDILOL 3.125 MG PO TABS
3.1250 mg | ORAL_TABLET | Freq: Two times a day (BID) | ORAL | Status: DC
  Administered 2013-05-02 – 2013-05-08 (×12): 3.125 mg via ORAL
  Filled 2013-05-02 (×21): qty 1

## 2013-05-02 NOTE — Progress Notes (Addendum)
Subjective: No apparent CP or SOB.  His daughter feels he is doing better today.  Objective: Vital signs in last 24 hours: Temp:  [97.4 F (36.3 C)-98.1 F (36.7 C)] 98.1 F (36.7 C) (03/23 0734) Pulse Rate:  [41-113] 89 (03/23 0734) Resp:  [11-30] 20 (03/23 0734) BP: (99-159)/(46-105) 130/95 mmHg (03/23 0734) SpO2:  [91 %-99 %] 98 % (03/23 0734) Last BM Date: 04/30/13  Intake/Output from previous day: 03/22 0701 - 03/23 0700 In: 108 [I.V.:108] Out: 525 [Urine:525] Intake/Output this shift:    Medications Current Facility-Administered Medications  Medication Dose Route Frequency Provider Last Rate Last Dose  . allopurinol (ZYLOPRIM) tablet 200 mg  200 mg Oral Daily Lewayne BuntingBrian S Crenshaw, MD   200 mg at 05/02/13 0907  . alum & mag hydroxide-simeth (MAALOX/MYLANTA) 200-200-20 MG/5ML suspension 30 mL  30 mL Oral Q2H PRN Leeann MustJacob Kelly, MD   30 mL at 05/01/13 2145  . aspirin EC tablet 81 mg  81 mg Oral Daily Wilburt FinlayBryan Hager, PA-C   81 mg at 05/02/13 [id number]  . atorvastatin (LIPITOR) tablet 40 mg  40 mg Oral q1800 Wilburt FinlayBryan Hager, PA-C   40 mg at 05/01/13 1859  . colchicine tablet 0.3 mg  0.3 mg Oral Daily Maryanna ShapeJennifer Danielle [place], RPH   0.3 mg at 05/02/13 [id number]  . dicyclomine (BENTYL) tablet 20 mg  20 mg Oral QID Wilburt FinlayBryan Hager, PA-C   20 mg at 05/02/13 0908  . DOPamine (INTROPIN) 800 mg in dextrose 5 % 250 mL infusion  2-20 mcg/kg/min Intravenous Titrated Quintella Reichertraci R Turner, MD   1 mcg/kg/min at 04/30/13 1404  . heparin ADULT infusion 100 units/mL (25000 units/250 mL)  900 Units/hr Intravenous Continuous Drake LeachRachel Lynn Rumbarger, RPH 9 mL/hr at 05/01/13 2335 900 Units/hr at 05/01/13 2335  . influenza vac split quadrivalent PF (FLUARIX) injection 0.5 mL  0.5 mL Intramuscular Prior to discharge Lewayne BuntingBrian S Crenshaw, MD      . nitroGLYCERIN (NITROSTAT) SL tablet 0.4 mg  0.4 mg Sublingual Q5 Min x 3 PRN Wilburt FinlayBryan Hager, PA-C      . ondansetron [facility](ZOFRAN) injection 4 mg  4 mg Intravenous Q6H PRN Leeann MustJacob Kelly, MD   4 mg at  05/01/13 0412  . pantoprazole (PROTONIX) EC tablet 40 mg  40 mg Oral Daily Wilburt FinlayBryan Hager, PA-C   40 mg at 05/02/13 [id number]  . zolpidem (AMBIEN) tablet 5 mg  5 mg Oral QHS PRN Leeann MustJacob Kelly, MD   5 mg at 05/01/13 2145    PE: General appearance: alert, cooperative and no distress Lungs: clear to auscultation bilaterally Heart: regular rate and rhythm, S1, S2 normal, no murmur, click, rub or gallop Abdomen: + BS.  Nontender Extremities: No LEE Pulses: 2+ and symmetric Skin: Warm and dry Neurologic: Grossly normal.  He does appear to have some right sided weakness and according to family is unsteady on his feet.  Lab Results:   Recent Labs  04/30/13 0248 05/01/13 0410 05/02/13 0252  WBC 9.4 6.7 7.4  HGB 12.6* 12.3* 13.7  HCT 36.4* 36.0* 38.9*  PLT 241 211 236   BMET  Recent Labs  05/01/13 0410 05/02/13 0252  NA 138 135*  K 4.0 3.9  CL 97 97  CO2 24 23  GLUCOSE 99 119*  BUN 36* 25*  CREATININE 2.43* 1.87*  CALCIUM 8.9 9.0    Assessment/Plan   Principal Problem:   Dyspnea Active Problems:   Hypertension   Atrial fibrillation- unknown duration   NSTEMI - ? type 2 -  Troponin 0.63   Acute CHF- presume secondary to AF and diastolic dysfunction. (Nl LVF 2010)   Chronic renal insufficiency, stage III (moderate)   PVD (peripheral vascular disease) 3.5cm AAA Aug 2014   CVA (cerebral infarction)  Plan:  Acute change in neuro status yesterday.  CT Head revealed no acute intracranial abnormalities. Mild cerebral atrophy. Chronic microvascular ischemic changes in the cerebral white matter.  Neuro following and is supposed to have an MRI/MRA, carotid dopplers, PT/OT and speech consults but no orders are written.  Will follow up with Neuro.  Currently in Afib with controlled rate on no rate meds(He converted back to NSR while writing this note)..  2D echo pending.  On IV heparin, ASA.  Off dopamine.   Statin.    EF 20-25%.  R/L heart caths when renal function improves.  Some  improvement today. 2.43>>1.87.  Continue to monitor fluid status..  Looks euvolemic.     LOS: 4 days    HAGER, BRYAN 05/02/2013 9:52 AM  Patient seen, examined. Available data reviewed. Agree with findings, assessment, and plan as outlined by Wilburt Finlay, PA-C. Exam reveals an elderly and pleasant gentleman in no distress. JVP is normal. Lungs are clear bilaterally. Heart is regular rate and rhythm without murmurs or gallops. His abdomen is soft and nontender. There is no peripheral edema noted. Strength is mildly diminished in the right arm compared with the left.  I discussed the situation with the patient through an interpreter as well as multiple family members. He has a newly diagnosed severe cardiomyopathy with LVEF less than 30%. Right and left heart catheterization has been recommended. I think for now this should be delayed while he undergoes neurologic testing for stroke. His renal function is improving, and if this trend continues his risk of contrast-induced nephropathy will be lower when he undergoes catheterization. Differential diagnosis for his cardiomyopathy includes tachycardia mediated versus ischemic as the most likely etiologies. The patient is anticoagulated with IV heparin in the setting of paroxysmal atrial fibrillation and stroke. He will clearly need long-term anticoagulation. Will start low-dose carvedilol in the setting of his cardiomyopathy. He is not a candidate for an ACE inhibitor or ARB because of acute kidney injury. Will continue to follow closely with you.  Tonny Bollman, M.D. 05/02/2013 10:46 AM

## 2013-05-02 NOTE — Evaluation (Signed)
Clinical/Bedside Swallow Evaluation Patient Details  Name: [name] MRN: [id number] Date of Birth: [birth year]  Today's Date: 05/02/2013 Time: 0254-2706 SLP Time Calculation (min): 24 min  Past Medical History:  Past Medical History  Diagnosis Date  . Hypertension   . Gout   . Hard of hearing   . Anemia   . AAA (abdominal aortic aneurysm)   . Depression   . Hyperplasia, prostate   . Asthma   . H. pylori infection   . PUD (peptic ulcer disease)   . Hiatal hernia   . Renal failure, acute 11/08/2012   Past Surgical History:  Past Surgical History  Procedure Laterality Date  . Esophagogastroduodenoscopy  06/13/2008,12/31/12   HPI:  80 y.o. male with multiple risk factors for stroke, including atrial fibrillation on anticoagulation, with probable acute left subcortical ischemic infarction   Assessment / Plan / Recommendation Clinical Impression  Pt demonstrates adequate tolerance of thin liquids and puree. Question right oral sensory deficit. Pt will not accept solid trials and typically eats soft foods at home. Will start dys 2 diet with thin liquids and ok with family providing outside food as pt is unlikely to consume hospital foods due to cultural differences. Will f/u with observation of meal tomorrow to further assess tolerance and need for ongoing swallowing therapy.     Aspiration Risk  Mild    Diet Recommendation Dysphagia 2 (Fine chop);Thin liquid   Liquid Administration via: Cup;Straw Medication Administration: Whole meds with liquid Supervision: Staff to assist with self feeding;Trained caregiver to feed patient;Full supervision/cueing for compensatory strategies Compensations: Slow rate;Small sips/bites Postural Changes and/or Swallow Maneuvers: Seated upright 90 degrees    Other  Recommendations Oral Care Recommendations: Oral care BID   Follow Up Recommendations  Home health SLP    Frequency and Duration min 2x/week  1 week   Pertinent Vitals/Pain  NA    SLP Swallow Goals     Swallow Study Prior Functional Status       General HPI: 80 y.o. male with multiple risk factors for stroke, including atrial fibrillation on anticoagulation, with probable acute left subcortical ischemic infarction Type of Study: Bedside swallow evaluation Diet Prior to this Study: NPO Temperature Spikes Noted: No Respiratory Status: Room air History of Recent Intubation: No Behavior/Cognition: Alert Oral Cavity - Dentition: Edentulous;Dentures, top;Dentures, bottom;Dentures, not available Self-Feeding Abilities: Needs assist Patient Positioning: Upright in bed Baseline Vocal Quality: Low vocal intensity Volitional Cough: Strong Volitional Swallow: Able to elicit    Oral/Motor/Sensory Function Overall Oral Motor/Sensory Function: Impaired Labial ROM: Reduced right Lingual ROM: Reduced right   Ice Chips     Thin Liquid Thin Liquid: Within functional limits Presentation: Cup;Straw    Nectar Thick Nectar Thick Liquid: Not tested   Honey Thick Honey Thick Liquid: Not tested   Puree Puree: Within functional limits   Solid   GO    Solid: Not tested      Harlon Ditty, MA CCC-SLP [phone]  Claudine Mouton 05/02/2013,4:00 PM

## 2013-05-02 NOTE — Progress Notes (Signed)
Stroke Team Progress Note  HISTORY [name] is an 80 y.o. male with a history of heart failure, hypertension and abdominal aortic aneurysm was admitted on 04/28/2013 for management of atrial fibrillation and heart failure. Patient is currently on heparin drip for anticoagulation. At noon on 05/01/2013 his daughter noticed that he had difficulty feeding himself and she was unable to understand his speech. There is no previous history of stroke. NIH stroke score was 5. CT scan of his head showed no acute intracranial abnormality including no signs of acute infarction or acute stroke.  tPA was not considered as pt was anticoagulated on IV heparin.   SUBJECTIVE His wife and family are at the bedside - none of them speak [place]. The daughter, who does speak [place], and is coming shortly.   OBJECTIVE Most recent Vital Signs: Filed Vitals:   05/02/13 0409 05/02/13 0500 05/02/13 0600 05/02/13 0734  BP: 123/64 118/92 132/81 130/95  Pulse: 75 48 101 89  Temp: 98 F (36.7 C)   98.1 F (36.7 C)  TempSrc: Oral   Axillary  Resp: 20 24 23 20   Height:      Weight:      SpO2: 95% 94% 93% 98%   CBG (last 3)  No results found for this basename: GLUCAP,  in the last 72 hours  IV Fluid Intake:   . DOPamine Stopped (04/30/13 1542)  . heparin 900 Units/hr (05/01/13 2335)    MEDICATIONS  . allopurinol  200 mg Oral Daily  . aspirin EC  81 mg Oral Daily  . atorvastatin  40 mg Oral q1800  . colchicine  0.3 mg Oral Daily  . dicyclomine  20 mg Oral QID  . pantoprazole  40 mg Oral Daily   PRN:  alum & mag hydroxide-simeth, influenza vac split quadrivalent PF, nitroGLYCERIN, ondansetron (ZOFRAN) IV, zolpidem  Diet:  Carb Control  Activity:  Bedrest DVT Prophylaxis:  IV heparin  CLINICALLY SIGNIFICANT STUDIES Basic Metabolic Panel:  Recent Labs Lab 05/01/13 0410 05/02/13 0252  NA 138 135*  K 4.0 3.9  CL 97 97  CO2 24 23  GLUCOSE 99 119*  BUN 36* 25*  CREATININE 2.43* 1.87*  CALCIUM  8.9 9.0   Liver Function Tests:  Recent Labs Lab 04/28/13 1050  AST 25  ALT 15  ALKPHOS 75  BILITOT 0.5  PROT 7.5  ALBUMIN 3.5   CBC:  Recent Labs Lab 04/28/13 1050  05/01/13 0410 05/02/13 0252  WBC 9.3  < > 6.7 7.4  NEUTROABS 7.1  --   --   --   HGB 13.0  < > 12.3* 13.7  HCT 38.1*  < > 36.0* 38.9*  MCV 86.4  < > 86.1 85.7  PLT 225  < > 211 236  < > = values in this interval not displayed. Coagulation:  Recent Labs Lab 04/28/13 1050  LABPROT 12.3  INR 0.93   Cardiac Enzymes:  Recent Labs Lab 04/28/13 1606 04/28/13 2304 04/29/13 0326  TROPONINI 0.63* 0.43* 0.39*   Urinalysis: No results found for this basename: COLORURINE, APPERANCEUR, LABSPEC, PHURINE, GLUCOSEU, HGBUR, BILIRUBINUR, KETONESUR, PROTEINUR, UROBILINOGEN, NITRITE, LEUKOCYTESUR,  in the last 168 hours Lipid Panel    Component Value Date/Time   CHOL 206* 04/29/2013 0326   TRIG 114 04/29/2013 0326   HDL 39* 04/29/2013 0326   CHOLHDL 5.3 04/29/2013 0326   VLDL 23 04/29/2013 0326   LDLCALC 144* 04/29/2013 0326   HgbA1C  No results found for this basename: HGBA1C  Urine Drug Screen:   No results found for this basename: labopia, cocainscrnur, labbenz, amphetmu, thcu, labbarb    Alcohol Level: No results found for this basename: ETH,  in the last 168 hours   CT of the brain  05/01/2013    1. No acute intracranial abnormalities. 2. Mild cerebral atrophy. 3. Chronic microvascular ischemic changes in the cerebral white matter.     MRI of the brain    MRA of the brain    2D Echocardiogram  Diffuse hypokineisis worse in the inferior wall and apex The cavity size was mildly dilated. Wall thickness was normal. The estimated ejection fraction was 35%. Moderate MV regurgitation.  Carotid Doppler    CXR    EKG  atrial fibrillation. For complete results please see formal report.   Therapy Recommendations   Physical Exam   Frail elderly sian male not in distress.Awake alert. Afebrile. Head is  nontraumatic. Neck is supple without bruit.  . Cardiac exam no murmur or gallop. Lungs are clear to auscultation. Distal pulses are well felt. Neurological Exam : Highly limited as patient and family members in the room but not speaking. His awake alert able to speak in his language fluently. Extraocular movements are full range without nystagmus. Face is symmetric. Tongue is midline. Mild left hemiparesis can withdraw to pain but not able to hold upper or  lower extremity up against gravity. Sensation and coordination cannot be reliably tested. Gait was not tested. ASSESSMENT [name] is a 80 y.o. male initially admitted for atrial fibrillation and heart failure, who then developed new-onset slurred speech and right-sided weakness in the hospital. Initial CT imaging unrevealing. MRI recommended but not ordered. Suspect new subcortical left brain stroke, likely embolic secondary to known atrial fibrillation. On aspirin 81 mg orally every day and heparin at the time of consult. Now on aspirin 81 mg orally every day and heparin for secondary stroke prevention. Stroke work up underway.  hypertension atrial fibrillation NSTEMI Acute CHF, felt to be secondary to atrial fibrillation and diastolic dysfunction Chronic renal insufficiency, stage III (moderate) PVD  Hyperlipidemia, LDL 144, on No statin PTA, now on no statin, goal LDL < 100   Hospital day # 4  TREATMENT/PLAN  Continue aspirin 81 mg orally every day and heparin for secondary stroke prevention for now. Typically do not use heparin in stroke; however, as this stroke is thought to be small and subcortical, will be ok for now.  Have ordered stroke workup - MRI, MRA, carotid doppler, HgbA1c  Also ordered PT, OT and ST for swallow and language evals  Given new stroke stymptoms, have made pt NPO until swallow eval. Diet per ST recs  Dr. Pearlean BrownieSethi discussed diagnosis, prognosis,  treatment options and plan of care with pt's daughter  via the telephone.    Annie MainSharon Biby, MSN, RN, ANVP-BC, AGPCNP-BC [facility] Pager: [phone] 05/02/2013 10:39 AM  I have personally obtained a history, examined the patient, evaluated imaging results, and formulated the assessment and plan of care. I agree with the above.  Delia HeadyPramod Sethi  To contact Stroke Continuity provider, please refer to [email or web address]. After hours, contact General Neurology

## 2013-05-02 NOTE — Progress Notes (Signed)
ANTICOAGULATION CONSULT NOTE - Follow Up Consult  Pharmacy Consult for Heparin  Indication: chest pain/ACS, afib  Allergies  Allergen Reactions  . Phenazopyridine Hcl Other (See Comments)    unknown  . Septra [Sulfamethoxazole-Tmp Ds] Other (See Comments)    unknown    Patient Measurements: Height: 5\' 2"  (157.5 cm) Weight: 153 lb 10.6 oz (69.7 kg) IBW/kg (Calculated) : 54.6 Vital Signs: Temp: 98.1 F (36.7 C) (03/23 0734) Temp src: Axillary (03/23 0734) BP: 130/95 mmHg (03/23 0734) Pulse Rate: 89 (03/23 0734)  Labs:  Recent Labs  04/30/13 0248 05/01/13 0410 05/02/13 0252  HGB 12.6* 12.3* 13.7  HCT 36.4* 36.0* 38.9*  PLT 241 211 236  HEPARINUNFRC 0.34 0.49 0.32  CREATININE  --  2.43* 1.87*    Estimated Creatinine Clearance: 25.2 ml/min (by C-G formula based on Cr of 1.87).   Medications:  Heparin 900 units/hr  Assessment: 80 y/o M on heparin for elevated trop/NSTEMI and afib. Today, HL is therapeutic at 0.32. CBC is stable and no bleeding noted. Of note, pt had new onset slurred speech and R-sided weakness yesterday afternoon. Will reduce heparin level goal to 0.3-0.5 to reduce risk of possible hemorrhagic conversion.  Goal of Therapy:  Heparin level 0.3-0.5 units/ml Monitor platelets by anticoagulation protocol: Yes   Plan:  1. Continue heparin gtt at 900 units/hr 2. F/u AM heparin level and CBC 3. F/u plans for oral anticoagulation  Lysle Pearl, PharmD, BCPS Pager # [phone] 05/02/2013 9:22 AM

## 2013-05-03 ENCOUNTER — Inpatient Hospital Stay (HOSPITAL_COMMUNITY): Payer: PRIVATE HEALTH INSURANCE

## 2013-05-03 DIAGNOSIS — I4891 Unspecified atrial fibrillation: Secondary | ICD-10-CM | POA: Diagnosis not present

## 2013-05-03 DIAGNOSIS — I6350 Cerebral infarction due to unspecified occlusion or stenosis of unspecified cerebral artery: Secondary | ICD-10-CM | POA: Diagnosis not present

## 2013-05-03 LAB — BASIC METABOLIC PANEL
BUN: 22 mg/dL (ref 6–23)
CO2: 22 mEq/L (ref 19–32)
Calcium: 9.5 mg/dL (ref 8.4–10.5)
Chloride: 100 mEq/L (ref 96–112)
Creatinine, Ser: 1.68 mg/dL — ABNORMAL HIGH (ref 0.50–1.35)
GFR calc Af Amer: 41 mL/min — ABNORMAL LOW (ref >90)
GFR, EST NON AFRICAN AMERICAN: 36 mL/min — AB (ref >90)
Glucose, Bld: 95 mg/dL (ref 70–99)
POTASSIUM: 4.2 meq/L (ref 3.7–5.3)
Sodium: 139 mEq/L (ref 137–147)

## 2013-05-03 LAB — CBC
HCT: 39.1 % (ref 39.0–52.0)
Hemoglobin: 13.2 g/dL (ref 13.0–17.0)
MCH: 29.5 pg (ref 26.0–34.0)
MCHC: 33.8 g/dL (ref 30.0–36.0)
MCV: 87.3 fL (ref 78.0–100.0)
Platelets: 216 10*3/uL (ref 150–400)
RBC: 4.48 MIL/uL (ref 4.22–5.81)
RDW: 14.2 % (ref 11.5–15.5)
WBC: 6.7 10*3/uL (ref 4.0–10.5)

## 2013-05-03 LAB — HEMOGLOBIN A1C
Hgb A1c MFr Bld: 6 % — ABNORMAL HIGH (ref <5.7)
Mean Plasma Glucose: 126 mg/dL — ABNORMAL HIGH (ref <117)

## 2013-05-03 LAB — HEPARIN LEVEL (UNFRACTIONATED)
HEPARIN UNFRACTIONATED: 0.63 [IU]/mL (ref 0.30–0.70)
Heparin Unfractionated: <0.1 IU/mL — ABNORMAL LOW (ref 0.30–0.70)
Heparin Unfractionated: 0.28 IU/mL — ABNORMAL LOW (ref 0.30–0.70)

## 2013-05-03 MED ORDER — HEPARIN (PORCINE) IN NACL 100-0.45 UNIT/ML-% IJ SOLN
950.0000 [IU]/h | INTRAMUSCULAR | Status: DC
  Administered 2013-05-03: 900 [IU]/h via INTRAVENOUS
  Administered 2013-05-04: 950 [IU]/h via INTRAVENOUS
  Filled 2013-05-03 (×3): qty 250

## 2013-05-03 MED ORDER — SODIUM CHLORIDE 0.9 % IJ SOLN: 3.0000 mL | Freq: Two times a day (BID) | INTRAMUSCULAR | Status: DC

## 2013-05-03 MED ORDER — SODIUM CHLORIDE 0.9 % IV SOLN
INTRAVENOUS | Status: DC
  Administered 2013-05-04: 75 mL/h via INTRAVENOUS

## 2013-05-03 MED ORDER — SODIUM CHLORIDE 0.9 % IJ SOLN: 3.0000 mL | INTRAMUSCULAR | Status: DC | PRN

## 2013-05-03 MED ORDER — SODIUM CHLORIDE 0.9 % IV SOLN: 250.0000 mL | INTRAVENOUS | Status: DC | PRN

## 2013-05-03 NOTE — Progress Notes (Signed)
VASCULAR LAB PRELIMINARY  PRELIMINARY  PRELIMINARY  PRELIMINARY  Carotid completed.    Preliminary report:  RICA velocities 1-39% and the LICA velocities are  80-99%. Vertebral artery flow is patent and flow is antegrade, bilaterally.   Taiylor Virden, RVT 05/03/2013, 1:13 PM

## 2013-05-03 NOTE — Progress Notes (Signed)
ANTICOAGULATION CONSULT NOTE   Pharmacy Consult for Heparin  Indication: chest pain/ACS, afib  Allergies  Allergen Reactions  . Phenazopyridine Hcl Other (See Comments)    unknown  . Septra [Sulfamethoxazole-Tmp Ds] Other (See Comments)    unknown    Patient Measurements: Height: 5\' 2"  (157.5 cm) Weight: 153 lb 10.6 oz (69.7 kg) IBW/kg (Calculated) : 54.6 Vital Signs: Temp: 98.3 F (36.8 C) (03/23 2250) Temp src: Oral (03/23 2250) BP: 141/82 mmHg (03/23 2250) Pulse Rate: 91 (03/23 2250)  Labs:  Recent Labs  04/30/13 0248 05/01/13 0410 05/02/13 0252 05/02/13 2337  HGB 12.6* 12.3* 13.7  --   HCT 36.4* 36.0* 38.9*  --   PLT 241 211 236  --   HEPARINUNFRC 0.34 0.49 0.32 0.63  CREATININE  --  2.43* 1.87*  --     Estimated Creatinine Clearance: 25.2 ml/min (by C-G formula based on Cr of 1.87).  Assessment: 80 y/o Male with ACS/Afib, new CVA, for heparin   Goal of Therapy:  Heparin level 0.3-0.5 units/ml Monitor platelets by anticoagulation protocol: Yes   Plan:  Decrease heparin 800 units/hr Follow-up am labs.  Geannie Risen, PharmD, BCPS   05/03/2013 1:08 AM

## 2013-05-03 NOTE — Progress Notes (Signed)
ANTICOAGULATION CONSULT NOTE - Follow Up Consult  Pharmacy Consult for Heparin  Indication: chest pain/ACS, afib  Allergies  Allergen Reactions  . Phenazopyridine Hcl Other (See Comments)    unknown  . Septra [Sulfamethoxazole-Tmp Ds] Other (See Comments)    unknown    Patient Measurements: Height: 5\' 2"  (157.5 cm) Weight: 152 lb 1.9 oz (69 kg) IBW/kg (Calculated) : 54.6 Vital Signs: Temp: 98.3 F (36.8 C) (03/24 0356) Temp src: Oral (03/24 0356) BP: 141/61 mmHg (03/24 0900) Pulse Rate: 88 (03/24 0900)  Labs:  Recent Labs  05/01/13 0410 05/02/13 0252 05/02/13 2337 05/03/13 0900  HGB 12.3* 13.7  --  13.2  HCT 36.0* 38.9*  --  39.1  PLT 211 236  --  216  HEPARINUNFRC 0.49 0.32 0.63 <0.10*  CREATININE 2.43* 1.87*  --  1.68*    Estimated Creatinine Clearance: 28 ml/min (by C-G formula based on Cr of 1.68).  Medications:  Heparin 800 units/hr  Assessment: 80 y/o M on heparin for elevated trop/NSTEMI, afib and new CVA. Heparin level was above goal late last night and dose was reduced. A heparin level was rechecked this AM and was undetectable. However, after speaking with the nurse, she stated that the line was leaking when she assessed at ~0830 today. It was leaking for an unknown amount of time so unclear if the patient was actually getting any drug. The undetectable heparin level was drawn at 0900, only 30 minutes after line had been corrected and was infusing properly. CBC is stable and no bleeding noted.   Goal of Therapy:  Heparin level 0.3-0.5 units/ml Monitor platelets by anticoagulation protocol: Yes   Plan:  1. Continue heparin gtt at 800 units/hr - check a heparin level this afternoon to better assess current rate since heparin was on infusing properly just prior to when undetectable level was drawn 2. Continue daily CBC and heparin level 3. F/u plans for oral anticoagulation  Lysle Pearl, PharmD, BCPS Pager # [phone] 05/03/2013 11:17  AM

## 2013-05-03 NOTE — Evaluation (Signed)
Speech Language Pathology Evaluation Patient Details Name: [name] MRN: [id number] DOB: 05-13-1926 Today's Date: 05/03/2013 Time: 7425-9563 SLP Time Calculation (min): 16 min  Problem List:  Patient Active Problem List   Diagnosis Date Noted  . CVA (cerebral infarction) 05/02/2013  . Dyspnea 04/29/2013  . Acute CHF- presume secondary to AF and diastolic dysfunction. (Nl LVF 2010) 04/29/2013  . Chronic renal insufficiency, stage III (moderate) 04/29/2013  . PVD (peripheral vascular disease) 3.5cm AAA Aug 2014 04/29/2013  . Atrial fibrillation- unknown duration 04/28/2013  . NSTEMI - ? type 2 - Troponin 0.63 04/28/2013  . Chronic bilateral lower abdominal pain 02/17/2013  . Hypertension    Past Medical History:  Past Medical History  Diagnosis Date  . Hypertension   . Gout   . Hard of hearing   . Anemia   . AAA (abdominal aortic aneurysm)   . Depression   . Hyperplasia, prostate   . Asthma   . H. pylori infection   . PUD (peptic ulcer disease)   . Hiatal hernia   . Renal failure, acute 11/08/2012   Past Surgical History:  Past Surgical History  Procedure Laterality Date  . Esophagogastroduodenoscopy  06/13/2008,12/31/12   HPI:  80 y.o. male with multiple risk factors for stroke, including atrial fibrillation on anticoagulation, with probable acute left subcortical ischemic infarction   Assessment / Plan / Recommendation Clinical Impression  80 y.o. [place] male who speaks no English presents with functional communication that is consistent with baseline, according to pt's daughter and his interpretor, who knows pt well and attends his MD appointments with him.  Speech is clear with no dysarthria; there are no symptoms of naming deficits; comprehension is intact for word discrimination and basic instructions.  Pt not a talkative person at baseline, per family.  No SLP needs identified.  In addition, pt was observed to consume thin liquids from a straw with no s/s of  dysphagia nor aspiration.  Recommend continuing soft diet.  No further SLP f/u for swallow recommended.  Will sign off.     SLP Assessment  Patient does not need any further Speech Lanaguage Pathology Services    Follow Up Recommendations  None       SLP Evaluation Prior Functioning  Cognitive/Linguistic Baseline: Baseline deficits Baseline deficit details: mild memory deficits per interpretor Type of Home: House  Lives With: Spouse Available Help at Discharge: Family;Available 24 hours/day   Cognition  Overall Cognitive Status: Within Functional Limits for tasks assessed Arousal/Alertness: Awake/alert Orientation Level: Oriented to person;Oriented to place Attention: Sustained Sustained Attention: Appears intact Memory:  (mild baseline deficits)    Comprehension  Auditory Comprehension Overall Auditory Comprehension: Appears within functional limits for tasks assessed Yes/No Questions: Within Functional Limits Commands: Within Functional Limits Conversation: Simple Visual Recognition/Discrimination Discrimination: Within Function Limits Reading Comprehension Reading Status: Not tested    Expression Expression Primary Mode of Expression: Verbal Verbal Expression Overall Verbal Expression: Appears within functional limits for tasks assessed Initiation: No impairment Automatic Speech: Name;Social Response Level of Generative/Spontaneous Verbalization: Sentence Repetition: No impairment Naming: No impairment Pragmatics: No impairment Written Expression Dominant Hand: Right Written Expression: Not tested   Oral / Motor Oral Motor/Sensory Function Overall Oral Motor/Sensory Function:  (mild right CN VII asymmetry) Motor Speech Overall Motor Speech: Appears within functional limits for tasks assessed   Analya Louissaint L. Samson Frederic, [place] CCC/SLP Pager [phone]      Blenda Mounts Laurice 05/03/2013, 10:21 AM

## 2013-05-03 NOTE — Progress Notes (Signed)
ANTICOAGULATION CONSULT NOTE - Follow Up Consult  Pharmacy Consult for Heparin  Indication: chest pain/ACS, afib  Allergies  Allergen Reactions  . Phenazopyridine Hcl Other (See Comments)    unknown  . Septra [Sulfamethoxazole-Tmp Ds] Other (See Comments)    unknown    Patient Measurements: Height: 5\' 2"  (157.5 cm) Weight: 152 lb 1.9 oz (69 kg) IBW/kg (Calculated) : 54.6 Vital Signs: Temp: 97.8 F (36.6 C) (03/24 1942) Temp src: Oral (03/24 1942) BP: 111/68 mmHg (03/24 1942) Pulse Rate: 86 (03/24 1942)  Labs:  Recent Labs  05/01/13 0410 05/02/13 0252 05/02/13 2337 05/03/13 0900 05/03/13 1925  HGB 12.3* 13.7  --  13.2  --   HCT 36.0* 38.9*  --  39.1  --   PLT 211 236  --  216  --   HEPARINUNFRC 0.49 0.32 0.63 <0.10* 0.28*  CREATININE 2.43* 1.87*  --  1.68*  --     Estimated Creatinine Clearance: 28 ml/min (by C-G formula based on Cr of 1.68).  Medications:  Heparin 800 units/hr  Assessment: 80 y/o M on heparin for elevated trop/NSTEMI, afib and new CVA. Heparin level was above goal late last night and dose was reduced. A heparin level was rechecked this AM and was undetectable. However, after speaking with the nurse, she stated that the line was leaking when she assessed at ~0830 today. It was leaking for an unknown amount of time so unclear if the patient was actually getting any drug. The undetectable heparin level was drawn at 0900, only 30 minutes after line had been corrected and was infusing properly. CBC is stable and no bleeding noted.   Heparin level just below goal at 0.28.  Per RN no issues with IV to her knowledge.  Goal of Therapy:  Heparin level 0.3-0.5 units/ml Monitor platelets by anticoagulation protocol: Yes   Plan:  1. Increase IV heparin to 900 units/hr.  Recheck level with AM labs. 2. Continue daily CBC and heparin level 3. F/u plans for oral anticoagulation.  Tad Moore, BCPS  Clinical Pharmacist Pager [phone]  05/03/2013 9:03 PM

## 2013-05-03 NOTE — Progress Notes (Signed)
Stroke Team Progress Note  HISTORY [name] is an 80 y.o. male with a history of heart failure, hypertension and abdominal aortic aneurysm was admitted on 04/28/2013 for management of atrial fibrillation and heart failure. Patient is currently on heparin drip for anticoagulation. At noon on 05/01/2013 his daughter noticed that he had difficulty feeding himself and she was unable to understand his speech. There is no previous history of stroke. NIH stroke score was 5. CT scan of his head showed no acute intracranial abnormality including no signs of acute infarction or acute stroke.  tPA was not considered as pt was anticoagulated on IV heparin.   SUBJECTIVE Family at bedside - includes wife and daughter.  OBJECTIVE Most recent Vital Signs: Filed Vitals:   05/02/13 2250 05/03/13 0000 05/03/13 0356 05/03/13 0900  BP: 141/82 135/61 125/61 141/61  Pulse: 91 96 83 88  Temp: 98.3 F (36.8 C)  98.3 F (36.8 C)   TempSrc: Oral  Oral   Resp: 25 23 23    Height:      Weight:   69 kg (152 lb 1.9 oz)   SpO2: 95% 95% 98% 99%   CBG (last 3)  No results found for this basename: GLUCAP,  in the last 72 hours  IV Fluid Intake:   . heparin 800 Units/hr (05/03/13 0125)    MEDICATIONS  . allopurinol  200 mg Oral Daily  . aspirin EC  81 mg Oral Daily  . atorvastatin  40 mg Oral q1800  . carvedilol  3.125 mg Oral BID WC  . colchicine  0.3 mg Oral Daily  . dicyclomine  20 mg Oral QID  . pantoprazole  40 mg Oral Daily   PRN:  alum & mag hydroxide-simeth, influenza vac split quadrivalent PF, nitroGLYCERIN, ondansetron (ZOFRAN) IV, zolpidem  Diet:  Dysphagia 2 thin liquids Activity:  Bedrest DVT Prophylaxis:  IV heparin  CLINICALLY SIGNIFICANT STUDIES Basic Metabolic Panel:   Recent Labs Lab 05/02/13 0252 05/03/13 0900  NA 135* 139  K 3.9 4.2  CL 97 100  CO2 23 22  GLUCOSE 119* 95  BUN 25* 22  CREATININE 1.87* 1.68*  CALCIUM 9.0 9.5   Liver Function Tests:   Recent  Labs Lab 04/28/13 1050  AST 25  ALT 15  ALKPHOS 75  BILITOT 0.5  PROT 7.5  ALBUMIN 3.5   CBC:  Recent Labs Lab 04/28/13 1050  05/02/13 0252 05/03/13 0900  WBC 9.3  < > 7.4 6.7  NEUTROABS 7.1  --   --   --   HGB 13.0  < > 13.7 13.2  HCT 38.1*  < > 38.9* 39.1  MCV 86.4  < > 85.7 87.3  PLT 225  < > 236 216  < > = values in this interval not displayed. Coagulation:   Recent Labs Lab 04/28/13 1050  LABPROT 12.3  INR 0.93   Cardiac Enzymes:   Recent Labs Lab 04/28/13 1606 04/28/13 2304 04/29/13 0326  TROPONINI 0.63* 0.43* 0.39*   Urinalysis: No results found for this basename: COLORURINE, APPERANCEUR, LABSPEC, PHURINE, GLUCOSEU, HGBUR, BILIRUBINUR, KETONESUR, PROTEINUR, UROBILINOGEN, NITRITE, LEUKOCYTESUR,  in the last 168 hours Lipid Panel    Component Value Date/Time   CHOL 206* 04/29/2013 0326   TRIG 114 04/29/2013 0326   HDL 39* 04/29/2013 0326   CHOLHDL 5.3 04/29/2013 0326   VLDL 23 04/29/2013 0326   LDLCALC 144* 04/29/2013 0326   HgbA1C  No results found for this basename: HGBA1C    Urine Drug Screen:  No results found for this basename: labopia,  cocainscrnur,  labbenz,  amphetmu,  thcu,  labbarb    Alcohol Level: No results found for this basename: ETH,  in the last 168 hours   CT of the brain  05/01/2013    1. No acute intracranial abnormalities. 2. Mild cerebral atrophy. 3. Chronic microvascular ischemic changes in the cerebral white matter.     MRI of the brain  05/02/2013 Several small acute nonhemorrhagic infarcts in different vascular distributions as detailed above raising the possibility of embolic disease.   MRA of the brain  05/02/2013 Intracranial atherosclerotic type changes   2D Echocardiogram  Diffuse hypokineisis worse in the inferior wall and apex The cavity size was mildly dilated. Wall thickness was normal. The estimated ejection fraction was 35%. Moderate MV regurgitation.  Carotid Doppler    CXR  04/28/2013 Increased interstitial  markings bilaterally are worrisome for  interstitial edema of cardiac or noncardiac calls. One cannot exclude interstitial pneumonia in the appropriate clinical setting.  A followup PA and lateral chest x-ray would be of value when the patient can tolerate the procedure.  EKG  atrial fibrillation. For complete results please see formal report.   Therapy Recommendations   Physical Exam   Frail elderly sian male not in distress.Awake alert. Afebrile. Head is nontraumatic. Neck is supple without bruit.  . Cardiac exam no murmur or gallop. Lungs are clear to auscultation. Distal pulses are well felt. Neurological Exam : Highly limited as patient and family members in the room but not speaking. His awake alert able to speak in his language fluently. Extraocular movements are full range without nystagmus. Face is symmetric. Tongue is midline. Mild left hemiparesis  but able to hold upper  and lower extremity up against gravity. Sensation and coordination cannot be reliably tested. Gait was not tested.  ASSESSMENT [name] is a 80 y.o. male initially admitted for atrial fibrillation and heart failure, who then developed new-onset slurred speech and right-sided weakness in the hospital. Initial CT imaging unrevealing. MRI confirmed bilateral, multiple vascular territory infarcts felt to be embolicsecondary to known atrial fibrillation. On aspirin 81 mg orally every day and heparin at the time of consult. Now on aspirin 81 mg orally every day and heparin for secondary stroke prevention. Stroke work up underway.  hypertension atrial fibrillation NSTEMI Acute CHF, felt to be secondary to atrial fibrillation and diastolic dysfunction Chronic renal insufficiency, stage III (moderate) PVD  Hyperlipidemia, LDL 144, on No statin PTA, now on no statin, goal LDL < 100   Hospital day # 5  TREATMENT/PLAN  Continue aspirin 81 mg orally every day and heparin for secondary stroke prevention for now.  Typically do not use heparin in stroke; however, as this stroke is thought to be small and subcortical, will be ok for now.  F/u stroke workup - carotid doppler, HgbA1c  Out of bed. Therapy evaluations.  Dr. Pearlean Brownie discussed diagnosis, prognosis,  treatment options and plan of care with pt's daughter via the telephone.    Annie Main, MSN, RN, ANVP-BC, AGPCNP-BC [facility] Pager: [phone] 05/03/2013 11:20 AM  I have personally obtained a history, examined the patient, evaluated imaging results, and formulated the assessment and plan of care. I agree with the above. Delia Heady  To contact Stroke Continuity provider, please refer to [email or web address]. After hours, contact General Neurology

## 2013-05-03 NOTE — Evaluation (Signed)
Occupational Therapy Evaluation Patient Details Name: [name] MRN: [id number] DOB: 02/17/1926 Today's Date: 05/03/2013 Time: 0900-0930 OT Time Calculation (min): 30 min  OT Assessment / Plan / Recommendation History of present illness Pt is an 80 yo male that does not speak English admitted initially for afib and CHF.  While in the hospital pt  began to have R side weakness and slurred speech on 3/22.  Pt has questionable L subcortical infart and is waiting for an MRI per neurology.  Translator in room and some of pts family speaks [place].     Clinical Impression   Pt admitted with the above diagnosis and has the deficits listed below. Pt would benefit from cont OT to increase I with basic adls so he can d/c home safely with his wife and resume prior level of functioning.    OT Assessment  Patient needs continued OT Services    Follow Up Recommendations  Home health OT;Supervision/Assistance - 24 hour    Barriers to Discharge   family able to provide 24/7 assist.  Equipment Recommendations  None recommended by OT    Recommendations for Other Services    Frequency  Min 2X/week    Precautions / Restrictions Precautions Precautions: Fall Precaution Comments: Pt with mild weakness in R side.  Restrictions Weight Bearing Restrictions: No   Pertinent Vitals/Pain Pt with no pain.  Vitals stable.    ADL  Eating/Feeding: Simulated;Minimal assistance;Other (comment) (min assist when using RUE.  Family feeding him on arrival.) Where Assessed - Eating/Feeding: Chair Grooming: Performed;Minimal assistance Where Assessed - Grooming: Supported sitting Upper Body Bathing: Simulated;Set up Where Assessed - Upper Body Bathing: Supported sitting Lower Body Bathing: Performed;Minimal assistance Where Assessed - Lower Body Bathing: Supported sit to stand Upper Body Dressing: Simulated;Minimal assistance Where Assessed - Upper Body Dressing: Supported sitting Lower Body Dressing:  Performed;Minimal assistance Where Assessed - Lower Body Dressing: Supported standing Toilet Transfer: Performed;Minimal Dentistassistance Toilet Transfer Method: Surveyor, mineralstand pivot Toilet Transfer Equipment: Materials engineerBedside commode Toileting - Clothing Manipulation and Hygiene: Performed;Minimal assistance Where Assessed - Engineer, miningToileting Clothing Manipulation and Hygiene: Standing Transfers/Ambulation Related to ADLs: Pt walked with HHA in room with therapist on R side.  Pt with some imbalance but only required +1 assist. ADL Comments: Pt required min assist to donn socks but can cross legs up toward him to dress LE.  Pt uncoordinated with RUE during adls and grooming tasks.    OT Diagnosis: Generalized weakness  OT Problem List: Decreased strength;Impaired balance (sitting and/or standing);Decreased cognition;Decreased knowledge of use of DME or AE;Impaired UE functional use OT Treatment Interventions: Self-care/ADL training;DME and/or AE instruction;Therapeutic activities   OT Goals(Current goals can be found in the care plan section) Acute Rehab OT Goals Patient Stated Goal: to go home. OT Goal Formulation: With patient Time For Goal Achievement: 05/17/13 Potential to Achieve Goals: Good ADL Goals Pt Will Perform Eating: Independently;sitting Pt Will Perform Grooming: with supervision;standing Pt Will Perform Lower Body Bathing: with supervision;sit to/from stand Pt Will Perform Lower Body Dressing: with supervision;sit to/from stand Pt Will Perform Tub/Shower Transfer: Tub transfer;with min assist (to bottom of tub) Pt/caregiver will Perform Home Exercise Program: Increased strength;Right Upper extremity;With theraband Additional ADL Goal #1: Pt will complete all toileting tasks with S.  Visit Information  Last OT Received On: 05/03/13 Assistance Needed: +1 History of Present Illness: Pt is an 80 yo male that does not speak English admitted initially for afib and CHF.  While in the hospital pt  began to  have R  side weakness and slurred speech on 3/22.  Pt has questionable L subcortical infart and is waiting for an MRI per neurology.  Translator in room and some of pts family speaks [place].         Prior Functioning     Home Living Family/patient expects to be discharged to:: Private residence Living Arrangements: Spouse/significant other Available Help at Discharge: Family;Available 24 hours/day Type of Home: House Home Access: Stairs to enter [employer] of Steps: 3 Entrance Stairs-Rails: None Home Layout: One level Home Equipment: None Additional Comments: Pt usually takes two short naps a day and otherwise is fairly active in his garden etc.  Pt does not drive. Prior Function Level of Independence: Independent Comments: Pt occasionally takes assist from wife with adls but can do for himself.  Family states that he and his wife are not usually oriented to date and time as they have no need to be.  Familiy assists with bills etc. Communication Communication: Prefers language other than [place];Interpreter utilized Dominant Hand: Right         Vision/Perception Vision - History Baseline Vision: No visual deficits Patient Visual Report: No change from baseline Vision - Assessment Eye Alignment: Within Functional Limits Perception Perception: Within Functional Limits   Cognition  Cognition Arousal/Alertness: Awake/alert Behavior During Therapy: WFL for tasks assessed/performed Overall Cognitive Status: Difficult to assess (due to language barrier) Difficult to assess due to: Non-English speaking    Extremity/Trunk Assessment Upper Extremity Assessment Upper Extremity Assessment: RUE deficits/detail RUE Deficits / Details: Pt with mild weakness 4/5 throughout compared to LUE RUE Sensation: decreased light touch (pt fumbles putting on socks.  ? sensation. States intact) RUE Coordination: decreased fine motor Lower Extremity Assessment Lower Extremity  Assessment: Defer to PT evaluation Cervical / Trunk Assessment Cervical / Trunk Assessment: Normal     Mobility Bed Mobility Overal bed mobility: Needs Assistance Bed Mobility: Supine to Sit Supine to sit: Min assist General bed mobility comments: Overall pt moves well in bed.  Pt concerned about lines. Transfers Overall transfer level: Needs assistance Equipment used: 1 person hand held assist Transfers: Sit to/from [employer] Sit to Stand: Min guard Stand pivot transfers: Min assist General transfer comment: pt with mild unsteadiness with transfers but this improved as eval progressed.     Exercise     Balance Balance Overall balance assessment: Needs assistance Sitting-balance support: Feet supported;Bilateral upper extremity supported Sitting balance-Leahy Scale: Good Standing balance support: Bilateral upper extremity supported;During functional activity Standing balance-Leahy Scale: Fair Standing balance comment: Pt stood with therapist for appx 4 minutes.  Feel at times he could stand unassisted but when pt went to move he required assist. General Comments General comments (skin integrity, edema, etc.): Spoke with family about allowing pt to do as much for himself as possible.  Family likes to do for the pt (may be cultural?)   End of Session OT - End of Session Equipment Utilized During Treatment: Oxygen Activity Tolerance: Patient tolerated treatment well Patient left: in chair;with call bell/phone within reach;with family/visitor present Nurse Communication: Mobility status  GO     Hope Budds 05/03/2013, 9:55 AM [phone]

## 2013-05-03 NOTE — Evaluation (Addendum)
Physical Therapy Evaluation Patient Details Name: Pura Spicehaeng Trier MRN: [id number] DOB: 03/13/1926 Today's Date: 05/03/2013   History of Present Illness  Pt is an 80 yo male that does not speak English admitted initially for afib and CHF.  While in the hospital pt  began to have R side weakness and slurred speech on 3/22.  Pt has questionable L subcortical infart and is waiting for an MRI per neurology.  Translator in room and some of pts family speaks [place].    Clinical Impression  Pt admitted with above. Pt currently with functional limitations due to the deficits listed below (see PT Problem List).  Pt will benefit from skilled PT to increase their independence and safety with mobility to allow discharge to the venue listed below.     Follow Up Recommendations Home health PT;Supervision/Assistance - 24 hour    Equipment Recommendations  Rolling walker with 5" wheels    Recommendations for Other Services       Precautions / Restrictions Precautions Precautions: Fall Precaution Comments: Pt with mild weakness in R side.  Restrictions Weight Bearing Restrictions: No      Mobility  Bed Mobility Overal bed mobility: Needs Assistance Bed Mobility: Supine to Sit     Supine to sit: Min assist     General bed mobility comments: Overall pt moves well in bed.  Pt concerned about lines.  Transfers Overall transfer level: Needs assistance Equipment used: Rolling walker (2 wheeled) Transfers: Sit to/from Stand Sit to Stand: Min guard         General transfer comment: Needed cues for hand placement.   Ambulation/Gait Ambulation/Gait assistance: Min assist;Mod assist Ambulation Distance (Feet): 200 Feet Assistive device: Rolling walker (2 wheeled) Gait Pattern/deviations: Step-through pattern;Decreased stride length;Decreased step length - right;Decreased dorsiflexion - right;Decreased stance time - left;Ataxic;Drifts right/left;Wide base of support   Gait velocity  interpretation: Below normal speed for age/gender General Gait Details: Pt needed cues to stay close to RW throughout walk.  Pt also needed cues to slow down.  As pt fatigued, he would drift to left as well as right LE would have difficulty with swing through phase as well as some foot drag on right at times.    Stairs            Wheelchair Mobility    Modified Rankin (Stroke Patients Only) Modified Rankin (Stroke Patients Only) Pre-Morbid Rankin Score: No symptoms Modified Rankin: Moderately severe disability     Balance Overall balance assessment: Needs assistance;History of Falls Sitting-balance support: Feet supported;No upper extremity supported Sitting balance-Leahy Scale: Good     Standing balance support: Bilateral upper extremity supported;During functional activity Standing balance-Leahy Scale: Fair Standing balance comment: Pt needs RW for support.  Unsteady without UE support.                     Pertinent Vitals/Pain VSS except brief episode of desat toward end of walk to 88% on RA, no pain    Home Living Family/patient expects to be discharged to:: Private residence Living Arrangements: Spouse/significant other Available Help at Discharge: Family;Available 24 hours/day Type of Home: House Home Access: Stairs to enter Entrance Stairs-Rails: None Entrance Stairs-Number of Steps: 3 Home Layout: One level Home Equipment: None Additional Comments: Pt usually takes two short naps a day and otherwise is fairly active in his garden etc.  Pt does not drive.    Prior Function Level of Independence: Independent         Comments: Pt occasionally  takes assist from wife with adls but can do for himself.  Family states that he and his wife are not usually oriented to date and time as they have no need to be.  Familiy assists with bills etc.     Hand Dominance   Dominant Hand: Right    Extremity/Trunk Assessment   Upper Extremity Assessment: Defer to  OT evaluation           Lower Extremity Assessment: RLE deficits/detail RLE Deficits / Details: grossly 3-/5    Cervical / Trunk Assessment: Normal  Communication   Communication: Prefers language other than [place];Interpreter utilized  Cognition Arousal/Alertness: Awake/alert Behavior During Therapy: WFL for tasks assessed/performed Overall Cognitive Status: Within Functional Limits for tasks assessed                      General Comments General comments (skin integrity, edema, etc.): Family did allow pt to do as much as he could.     Exercises General Exercises - Lower Extremity Ankle Circles/Pumps: AROM;Both;10 reps;Supine Long Arc Quad: AROM;Both;5 reps;Supine      Assessment/Plan    PT Assessment Patient needs continued PT services  PT Diagnosis Generalized weakness   PT Problem List Decreased activity tolerance;Decreased balance;Decreased knowledge of use of DME;Decreased safety awareness;Decreased knowledge of precautions;Decreased strength  PT Treatment Interventions DME instruction;Gait training;Functional mobility training;Therapeutic activities;Therapeutic exercise;Patient/family education   PT Goals (Current goals can be found in the Care Plan section) Acute Rehab PT Goals Patient Stated Goal: to go home. PT Goal Formulation: With patient Time For Goal Achievement: 05/10/13 Potential to Achieve Goals: Good    Frequency Min 3X/week   Barriers to discharge        End of Session Equipment Utilized During Treatment: Gait belt Activity Tolerance: Patient limited by fatigue Patient left: in chair;with call bell/phone within reach;with family/visitor present         Time: 1027-1110 PT Time Calculation (min): 43 min   Charges:   PT Evaluation $Initial PT Evaluation Tier I: 1 Procedure PT Treatments $Gait Training: 8-22 mins $Therapeutic Exercise: 8-22 mins   PT G Codes:          INGOLD,Beatris Belen 2013-05-19, 3:30 PM  [name] [phone] (pager)

## 2013-05-03 NOTE — Progress Notes (Signed)
Subjective: Alert no distress  Objective: Vital signs in last 24 hours: Temp:  [97.7 F (36.5 C)-98.3 F (36.8 C)] 98.3 F (36.8 C) (03/24 0356) Pulse Rate:  [83-96] 83 (03/24 0356) Resp:  [17-25] 23 (03/24 0356) BP: (124-141)/(61-82) 125/61 mmHg (03/24 0356) SpO2:  [90 %-99 %] 98 % (03/24 0356) Weight:  [152 lb 1.9 oz (69 kg)] 152 lb 1.9 oz (69 kg) (03/24 0356) Last BM Date: 04/30/13  Intake/Output from previous day: 03/23 0701 - 03/24 0700 In: 103 [I.V.:103] Out: -  Intake/Output this shift:    Medications Current Facility-Administered Medications  Medication Dose Route Frequency Provider Last Rate Last Dose  . allopurinol (ZYLOPRIM) tablet 200 mg  200 mg Oral Daily Lewayne Bunting, MD   200 mg at 05/02/13 0907  . alum & mag hydroxide-simeth (MAALOX/MYLANTA) 200-200-20 MG/5ML suspension 30 mL  30 mL Oral Q2H PRN Leeann Must, MD   30 mL at 05/01/13 2145  . aspirin EC tablet 81 mg  81 mg Oral Daily Wilburt Finlay, PA-C   81 mg at 05/02/13 0712  . atorvastatin (LIPITOR) tablet 40 mg  40 mg Oral q1800 Wilburt Finlay, PA-C   40 mg at 05/02/13 1735  . carvedilol (COREG) tablet 3.125 mg  3.125 mg Oral BID WC Tonny Bollman, MD   3.125 mg at 05/02/13 1736  . colchicine tablet 0.3 mg  0.3 mg Oral Daily Maryanna Shape Buckner, RPH   0.3 mg at 05/02/13 1975  . dicyclomine (BENTYL) tablet 20 mg  20 mg Oral QID Wilburt Finlay, PA-C   20 mg at 05/02/13 2208  . heparin ADULT infusion 100 units/mL (25000 units/250 mL)  800 Units/hr Intravenous Continuous Lewayne Bunting, MD 8 mL/hr at 05/03/13 0125 800 Units/hr at 05/03/13 0125  . influenza vac split quadrivalent PF (FLUARIX) injection 0.5 mL  0.5 mL Intramuscular Prior to discharge Lewayne Bunting, MD      . nitroGLYCERIN (NITROSTAT) SL tablet 0.4 mg  0.4 mg Sublingual Q5 Min x 3 PRN Wilburt Finlay, PA-C      . ondansetron [facility]) injection 4 mg  4 mg Intravenous Q6H PRN Leeann Must, MD   4 mg at 05/01/13 0412  . pantoprazole (PROTONIX) EC  tablet 40 mg  40 mg Oral Daily Wilburt Finlay, PA-C   40 mg at 05/02/13 8832  . zolpidem (AMBIEN) tablet 5 mg  5 mg Oral QHS PRN Leeann Must, MD   5 mg at 05/01/13 2145    PE: General appearance: alert, cooperative and no distress  Lungs: clear to auscultation bilaterally  Neck:  No JVD Heart: regular rate and rhythm, S1, S2 normal, no murmur, click, rub or gallop  Abdomen: + BS. Nontender  Extremities: No LEE  Pulses: 2+ and symmetric  Skin: Warm and dry  Neurologic: Grossly normal. He does appear to have some right sided weakness   Lab Results:   Recent Labs  05/01/13 0410 05/02/13 0252  WBC 6.7 7.4  HGB 12.3* 13.7  HCT 36.0* 38.9*  PLT 211 236   BMET  Recent Labs  05/01/13 0410 05/02/13 0252  NA 138 135*  K 4.0 3.9  CL 97 97  CO2 24 23  GLUCOSE 99 119*  BUN 36* 25*  CREATININE 2.43* 1.87*  CALCIUM 8.9 9.0     Assessment/Plan  Principal Problem:   Dyspnea Active Problems:   Hypertension   Atrial fibrillation- unknown duration   NSTEMI - ? type 2 - Troponin 0.63   Acute CHF-  presume secondary to AF and diastolic dysfunction. (Nl LVF 2010)   Chronic renal insufficiency, stage III (moderate)   PVD (peripheral vascular disease) 3.5cm AAA Aug 2014   CVA (cerebral infarction)  Plan:    MRI/MRA pending.  Bedside swallow eval complete.  PT/OT pending.    Maintaining sinus rhythm, 1st deg AVB.  On IV heparin, ASA.  Coreg added yesterday and BP is controlled.  Statin.  EF 20-25%.  R/L heart caths when renal function improves. BMET pending.  Continue to monitor fluid status. Looks euvolemic.  No orthopnea.  Net fluids: +0.1L/-1.2L.  I asked RN to make sure he is getting adequate PO fluid intake.        LOS: 5 days    HAGER, BRYAN PA-C 05/03/2013 8:02 AM  Patient seen, examined. Available data reviewed. Agree with findings, assessment, and plan as outlined by Wilburt FinlayBryan Hager, PA-C. The patient was independently interviewed and examined. His family helped to  translate. He denies chest pain or pressure. Had some dyspnea with walking. On exam, he is elderly, alert and awake. No acute distress. Jugular venous pressure is mildly elevated. Lungs are clear to auscultation bilaterally. Heart is regular rate and rhythm without murmur. There is mild pretibial edema bilaterally. Notes from the stroke service reviewed and appreciated their consultation. Plan on cardiac catheterization and possible PCI tomorrow. Will be important to limit contrast as much as possible because of renal insufficiency. Fortunately his creatinine continues to improve. Will schedule him just for left heart catheterization as this can be done radially and limit his bleeding risk since it he will require anticoagulation. I don't think a right heart catheterization we'll add much to his management. Will add low-dose hydralazine to his medical regimen and continue carvedilol. He is not a candidate for an ACE inhibitor or ARB in the setting of his renal disease with plans for contrasted ministration tomorrow. Risks, indications, and alternatives to cardiac catheterization and possible PCI were reviewed with the patient and his family.  Tonny BollmanMichael Madeliene Tejera, M.D. 05/03/2013 11:49 AM

## 2013-05-04 ENCOUNTER — Encounter (HOSPITAL_COMMUNITY)
Admission: EM | Disposition: A | Discharge status: Home Health | Payer: Self-pay | Source: Home / Self Care | Attending: Cardiology

## 2013-05-04 ENCOUNTER — Inpatient Hospital Stay (HOSPITAL_COMMUNITY): Payer: PRIVATE HEALTH INSURANCE

## 2013-05-04 DIAGNOSIS — I6350 Cerebral infarction due to unspecified occlusion or stenosis of unspecified cerebral artery: Secondary | ICD-10-CM | POA: Diagnosis not present

## 2013-05-04 DIAGNOSIS — I2510 Atherosclerotic heart disease of native coronary artery without angina pectoris: Secondary | ICD-10-CM

## 2013-05-04 DIAGNOSIS — I4891 Unspecified atrial fibrillation: Secondary | ICD-10-CM | POA: Diagnosis not present

## 2013-05-04 HISTORY — PX: LEFT HEART CATHETERIZATION WITH CORONARY ANGIOGRAM: SHX5451

## 2013-05-04 LAB — HEPARIN LEVEL (UNFRACTIONATED): Heparin Unfractionated: 0.29 IU/mL — ABNORMAL LOW (ref 0.30–0.70)

## 2013-05-04 LAB — CBC
HCT: 36.9 % — ABNORMAL LOW (ref 39.0–52.0)
Hemoglobin: 12.5 g/dL — ABNORMAL LOW (ref 13.0–17.0)
MCH: 29.6 pg (ref 26.0–34.0)
MCHC: 33.9 g/dL (ref 30.0–36.0)
MCV: 87.2 fL (ref 78.0–100.0)
PLATELETS: 202 10*3/uL (ref 150–400)
RBC: 4.23 MIL/uL (ref 4.22–5.81)
RDW: 14.2 % (ref 11.5–15.5)
WBC: 7.3 10*3/uL (ref 4.0–10.5)

## 2013-05-04 LAB — GLUCOSE, CAPILLARY: Glucose-Capillary: 91 mg/dL (ref 70–99)

## 2013-05-04 LAB — BASIC METABOLIC PANEL
BUN: 23 mg/dL (ref 6–23)
CALCIUM: 8.9 mg/dL (ref 8.4–10.5)
CHLORIDE: 101 meq/L (ref 96–112)
CO2: 22 mEq/L (ref 19–32)
CREATININE: 1.72 mg/dL — AB (ref 0.50–1.35)
GFR calc non Af Amer: 35 mL/min — ABNORMAL LOW (ref >90)
GFR, EST AFRICAN AMERICAN: 40 mL/min — AB (ref >90)
Glucose, Bld: 95 mg/dL (ref 70–99)
Potassium: 4.3 mEq/L (ref 3.7–5.3)
Sodium: 136 mEq/L — ABNORMAL LOW (ref 137–147)

## 2013-05-04 SURGERY — LEFT HEART CATHETERIZATION WITH CORONARY ANGIOGRAM
Anesthesia: LOCAL

## 2013-05-04 MED ORDER — HEPARIN (PORCINE) IN NACL 100-0.45 UNIT/ML-% IJ SOLN
850.0000 [IU]/h | INTRAMUSCULAR | Status: DC
  Administered 2013-05-05: 950 [IU]/h via INTRAVENOUS
  Administered 2013-05-06: 900 [IU]/h via INTRAVENOUS
  Administered 2013-05-07: 800 [IU]/h via INTRAVENOUS
  Administered 2013-05-08: 850 [IU]/h via INTRAVENOUS
  Filled 2013-05-04 (×6): qty 250

## 2013-05-04 MED ORDER — LIDOCAINE HCL (PF) 1 % IJ SOLN
INTRAMUSCULAR | Status: AC
Start: 2013-05-04 — End: 2013-05-04
  Filled 2013-05-04: qty 30

## 2013-05-04 MED ORDER — GUAIFENESIN 100 MG/5ML PO SOLN
15.0000 mL | Freq: Four times a day (QID) | ORAL | Status: DC | PRN
  Administered 2013-05-04: 300 mg via ORAL
  Filled 2013-05-04 (×2): qty 15

## 2013-05-04 MED ORDER — HEPARIN SODIUM (PORCINE) 1000 UNIT/ML IJ SOLN
INTRAMUSCULAR | Status: AC
  Filled 2013-05-04: qty 1

## 2013-05-04 MED ORDER — LEVALBUTEROL HCL 0.63 MG/3ML IN NEBU
0.6300 mg | INHALATION_SOLUTION | Freq: Four times a day (QID) | RESPIRATORY_TRACT | Status: DC | PRN
  Administered 2013-05-08: 0.63 mg via RESPIRATORY_TRACT
  Filled 2013-05-04 (×2): qty 3

## 2013-05-04 MED ORDER — SODIUM CHLORIDE 0.9 % IV SOLN: 1.0000 mL/kg/h | INTRAVENOUS | Status: AC

## 2013-05-04 MED ORDER — FENTANYL CITRATE 0.05 MG/ML IJ SOLN
INTRAMUSCULAR | Status: AC
  Filled 2013-05-04: qty 2

## 2013-05-04 MED ORDER — HEPARIN (PORCINE) IN NACL 2-0.9 UNIT/ML-% IJ SOLN
INTRAMUSCULAR | Status: AC
  Filled 2013-05-04: qty 1000

## 2013-05-04 MED ORDER — ACETAMINOPHEN 325 MG PO TABS
650.0000 mg | ORAL_TABLET | Freq: Four times a day (QID) | ORAL | Status: DC | PRN
  Administered 2013-05-04 – 2013-05-10 (×4): 650 mg via ORAL
  Filled 2013-05-04 (×5): qty 2

## 2013-05-04 MED ORDER — NITROGLYCERIN 0.2 MG/ML ON CALL CATH LAB
INTRAVENOUS | Status: AC
  Filled 2013-05-04: qty 1

## 2013-05-04 MED ORDER — MIDAZOLAM HCL 2 MG/2ML IJ SOLN
INTRAMUSCULAR | Status: AC
  Filled 2013-05-04: qty 2

## 2013-05-04 MED ORDER — VERAPAMIL HCL 2.5 MG/ML IV SOLN
INTRAVENOUS | Status: AC
  Filled 2013-05-04: qty 2

## 2013-05-04 NOTE — Progress Notes (Signed)
Subjective: Sleeping quietly with no distress.  Objective: Vital signs in last 24 hours: Temp:  [97.4 F (36.3 C)-98.9 F (37.2 C)] 98 F (36.7 C) (03/25 0800) Pulse Rate:  [64-86] 78 (03/25 0800) Resp:  [17-25] 21 (03/25 0800) BP: (97-138)/(48-75) 130/73 mmHg (03/25 0800) SpO2:  [92 %-99 %] 97 % (03/25 0800) Weight:  [152 lb 1.9 oz (69 kg)] 152 lb 1.9 oz (69 kg) (03/25 0300) Last BM Date: 04/30/13  Intake/Output from previous day: 03/24 0701 - 03/25 0700 In: 200.4 [I.V.:200.4] Out: -  Intake/Output this shift:    Medications Current Facility-Administered Medications  Medication Dose Route Frequency Provider Last Rate Last Dose  . 0.9 %  sodium chloride infusion  250 mL Intravenous PRN Tonny BollmanMichael Adriona Kaney, MD      . 0.9 %  sodium chloride infusion   Intravenous Continuous Tonny BollmanMichael Donnovan Stamour, MD 75 mL/hr at 05/04/13 0746 75 mL/hr at 05/04/13 0746  . allopurinol (ZYLOPRIM) tablet 200 mg  200 mg Oral Daily Lewayne BuntingBrian S Crenshaw, MD   200 mg at 05/04/13 0934  . alum & mag hydroxide-simeth (MAALOX/MYLANTA) 200-200-20 MG/5ML suspension 30 mL  30 mL Oral Q2H PRN Leeann MustJacob Kelly, MD   30 mL at 05/01/13 2145  . aspirin EC tablet 81 mg  81 mg Oral Daily Wilburt FinlayBryan Hager, PA-C   81 mg at 05/04/13 0934  . atorvastatin (LIPITOR) tablet 40 mg  40 mg Oral q1800 Wilburt FinlayBryan Hager, PA-C   40 mg at 05/03/13 1728  . carvedilol (COREG) tablet 3.125 mg  3.125 mg Oral BID WC Tonny BollmanMichael Miria Cappelli, MD   3.125 mg at 05/04/13 0934  . colchicine tablet 0.3 mg  0.3 mg Oral Daily Maryanna ShapeJennifer Danielle [place], RPH   0.3 mg at 05/04/13 [id number]  . dicyclomine (BENTYL) tablet 20 mg  20 mg Oral QID Wilburt FinlayBryan Hager, PA-C   20 mg at 05/04/13 0934  . heparin ADULT infusion 100 units/mL (25000 units/250 mL)  950 Units/hr Intravenous Continuous Drake LeachRachel Lynn Rumbarger, RPH 9.5 mL/hr at 05/04/13 0932 950 Units/hr at 05/04/13 0932  . influenza vac split quadrivalent PF (FLUARIX) injection 0.5 mL  0.5 mL Intramuscular Prior to discharge Lewayne BuntingBrian S Crenshaw, MD        . nitroGLYCERIN (NITROSTAT) SL tablet 0.4 mg  0.4 mg Sublingual Q5 Min x 3 PRN Wilburt FinlayBryan Hager, PA-C      . ondansetron [facility](ZOFRAN) injection 4 mg  4 mg Intravenous Q6H PRN Leeann MustJacob Kelly, MD   4 mg at 05/01/13 0412  . pantoprazole (PROTONIX) EC tablet 40 mg  40 mg Oral Daily Wilburt FinlayBryan Hager, PA-C   40 mg at 05/04/13 [id number]  . sodium chloride 0.9 % injection 3 mL  3 mL Intravenous Q12H Tonny BollmanMichael Faruq Rosenberger, MD      . sodium chloride 0.9 % injection 3 mL  3 mL Intravenous PRN Tonny BollmanMichael Jayden Kratochvil, MD      . zolpidem [facility](AMBIEN) tablet 5 mg  5 mg Oral QHS PRN Leeann MustJacob Kelly, MD   5 mg at 05/01/13 2145    PE: General appearance: alert, cooperative and no distress,  Was sleeping. Lungs: clear to auscultation bilaterally  Neck: No JVD  Heart: regular rate and rhythm, S1, S2 normal, no murmur, click, rub or gallop  Abdomen: + BS. Nontender  Extremities: No LEE  Pulses: 2+ and symmetric  Skin: Warm and dry  Neurologic: Grossly normal. He does appear to have some right sided weakness    Lab Results:   Recent Labs  05/02/13 0252 05/03/13 0900 05/04/13 0317  WBC 7.4 6.7 7.3  HGB 13.7 13.2 12.5*  HCT 38.9* 39.1 36.9*  PLT 236 216 202   BMET  Recent Labs  05/02/13 0252 05/03/13 0900 05/04/13 0317  NA 135* 139 136*  K 3.9 4.2 4.3  CL 97 100 101  CO2 23 22 22   GLUCOSE 119* 95 95  BUN 25* 22 23  CREATININE 1.87* 1.68* 1.72*  CALCIUM 9.0 9.5 8.9   MRA/MRI IMPRESSION: Several small acute nonhemorrhagic infarcts in different vascular distributions as detailed above raising the possibility of embolic disease.  Intracranial atherosclerotic type changes as detailed above.   Assessment/Plan   Principal Problem:   Acute CHF- presume secondary to AF and diastolic dysfunction. (Nl LVF 2010)  Net fluids:  +0.2?/-1.0L.  Euvolemic.  No PO intake charted.  I wonder how much fluid he is taking in?  No diuretics.   EF is 35%  Active Problems:   Atrial fibrillation- unknown duration  Currently in sinus rhythm at  79bpm 1st degree AVB.  Still in and out of afib with controlled rate.  Coreg 3.125BID.  IV  Heparin.  Consider coumadin at discharge.  Education will be very important.    NSTEMI - ? type 2 - Troponin Peak 0.63  ASA, heparin.  Left and right heart caths today.      Chronic renal insufficiency, stage III (moderate)  SCr stable at 1.72.  Low of 1.42.  Gentle hydration after cath is recommended.   CVA (cerebral infarction)  Neuro following.  MRA: Several small acute nonhemorrhagic infarcts in different vascular distributions.  Pt  ambulated 200 feet yesterday with rolling walker and minimal assistance from PT.  Will consider HH PT at  discharge.   Hypertension  Bp Stable.  Coreg 3.125 BID.  No ACE or ARB due to A on CKD   Dyspnea  Improved   PVD (peripheral vascular disease) 3.5cm AAA Aug 2014    LOS: 6 days    HAGER, BRYAN PA-C 05/04/2013 11:09 AM  Patient seen, examined. Available data reviewed. Agree with findings, assessment, and plan as outlined by Wilburt Finlay, PA-C. The patient was independently interviewed and examined. I discussed his case with Dr. Pearlean Brownie of neurology and with multiple family members. He has now been found to have severe left internal carotid artery stenosis by Doppler ultrasound. Recommendations from neurology is that he undergo carotid revascularization prior to discharge. His situation is fairly complicated considering congestive heart failure, severe cardiomyopathy with LVEF less than 30%, advanced age, and atrial fibrillation. Bleeding risk of "triple therapy" is too high. He will likely be treated with dual antiplatelet therapy using aspirin and Plavix for a limited time (3 months) followed by anticoagulation thereafter. Will await results from his cardiac catheterization today. Would favor a limited contrast study because of renal insufficiency. I do not think he needs a right heart catheterization. Pending those results, will continue his medical therapy. I do not  think he would be a candidate for either cardiac surgery or carotid endarterectomy. After his cardiac catheterization, will ask Dr. Allyson Sabal to see him for consideration of carotid stenting.  Tonny Bollman, M.D. 05/04/2013 12:45 PM

## 2013-05-04 NOTE — Progress Notes (Signed)
Stroke Team Progress Note  HISTORY [name] is an 80 y.o. male with a history of heart failure, hypertension and abdominal aortic aneurysm was admitted on 04/28/2013 for management of atrial fibrillation and heart failure. Patient is currently on heparin drip for anticoagulation. At noon on 05/01/2013 his daughter noticed that he had difficulty feeding himself and she was unable to understand his speech. There is no previous history of stroke. NIH stroke score was 5. CT scan of his head showed no acute intracranial abnormality including no signs of acute infarction or acute stroke.  tPA was not considered as pt was anticoagulated on IV heparin.   SUBJECTIVE Interpreter at the bedside. Four family members including English speaking daughter and wife are at the bedside. Daughter is translating for the rest of the family.  OBJECTIVE Most recent Vital Signs: Filed Vitals:   05/03/13 1942 05/04/13 0021 05/04/13 0300 05/04/13 0800  BP: 111/68 115/59 98/48 130/73  Pulse: 86 82 69 78  Temp: 97.8 F (36.6 C) 98.9 F (37.2 C) 98.5 F (36.9 C) 98 F (36.7 C)  TempSrc: Oral Axillary Axillary Oral  Resp: 23 20 18 21   Height:      Weight:   69 kg (152 lb 1.9 oz)   SpO2: 97% 92% 92% 97%   CBG (last 3)  No results found for this basename: GLUCAP,  in the last 72 hours  IV Fluid Intake:   . sodium chloride 75 mL/hr (05/04/13 0746)  . heparin 950 Units/hr (05/04/13 0932)    MEDICATIONS  . allopurinol  200 mg Oral Daily  . aspirin EC  81 mg Oral Daily  . atorvastatin  40 mg Oral q1800  . carvedilol  3.125 mg Oral BID WC  . colchicine  0.3 mg Oral Daily  . dicyclomine  20 mg Oral QID  . pantoprazole  40 mg Oral Daily  . sodium chloride  3 mL Intravenous Q12H   PRN:  sodium chloride, alum & mag hydroxide-simeth, influenza vac split quadrivalent PF, nitroGLYCERIN, ondansetron (ZOFRAN) IV, sodium chloride, zolpidem  Diet:  Clear Liquid 2 thin liquids Activity:  OOB with assistance DVT  Prophylaxis:  IV heparin  CLINICALLY SIGNIFICANT STUDIES Basic Metabolic Panel:   Recent Labs Lab 05/03/13 0900 05/04/13 0317  NA 139 136*  K 4.2 4.3  CL 100 101  CO2 22 22  GLUCOSE 95 95  BUN 22 23  CREATININE 1.68* 1.72*  CALCIUM 9.5 8.9   Liver Function Tests:   Recent Labs Lab 04/28/13 1050  AST 25  ALT 15  ALKPHOS 75  BILITOT 0.5  PROT 7.5  ALBUMIN 3.5   CBC:  Recent Labs Lab 04/28/13 1050  05/03/13 0900 05/04/13 0317  WBC 9.3  < > 6.7 7.3  NEUTROABS 7.1  --   --   --   HGB 13.0  < > 13.2 12.5*  HCT 38.1*  < > 39.1 36.9*  MCV 86.4  < > 87.3 87.2  PLT 225  < > 216 202  < > = values in this interval not displayed. Coagulation:   Recent Labs Lab 04/28/13 1050  LABPROT 12.3  INR 0.93   Cardiac Enzymes:   Recent Labs Lab 04/28/13 1606 04/28/13 2304 04/29/13 0326  TROPONINI 0.63* 0.43* 0.39*   Urinalysis: No results found for this basename: COLORURINE, APPERANCEUR, LABSPEC, PHURINE, GLUCOSEU, HGBUR, BILIRUBINUR, KETONESUR, PROTEINUR, UROBILINOGEN, NITRITE, LEUKOCYTESUR,  in the last 168 hours Lipid Panel    Component Value Date/Time   CHOL 206* 04/29/2013 0326  TRIG 114 04/29/2013 0326   HDL 39* 04/29/2013 0326   CHOLHDL 5.3 04/29/2013 0326   VLDL 23 04/29/2013 0326   LDLCALC 144* 04/29/2013 0326   HgbA1C  Lab Results  Component Value Date   HGBA1C 6.0* 05/03/2013    Urine Drug Screen:   No results found for this basename: labopia,  cocainscrnur,  labbenz,  amphetmu,  thcu,  labbarb    Alcohol Level: No results found for this basename: ETH,  in the last 168 hours   CT of the brain  05/01/2013    1. No acute intracranial abnormalities. 2. Mild cerebral atrophy. 3. Chronic microvascular ischemic changes in the cerebral white matter.     MRI of the brain  05/02/2013 Several small acute nonhemorrhagic infarcts in different vascular distributions as detailed above raising the possibility of embolic disease.   MRA of the brain  05/02/2013  Intracranial atherosclerotic type changes   2D Echocardiogram  Diffuse hypokineisis worse in the inferior wall and apex The cavity size was mildly dilated. Wall thickness was normal. The estimated ejection fraction was 35%. Moderate MV regurgitation.  Carotid Doppler  RICA velocities 1-39% and the LICA velocities are 80-99%. Vertebral artery flow is patent and flow is antegrade, bilaterally.   CXR  04/28/2013 Increased interstitial markings bilaterally are worrisome for  interstitial edema of cardiac or noncardiac calls. One cannot exclude interstitial pneumonia in the appropriate clinical setting.  A followup PA and lateral chest x-ray would be of value when the patient can tolerate the procedure.  EKG  atrial fibrillation. For complete results please see formal report.   Therapy Recommendations HH PT, OT  Physical Exam   Frail elderly sian male not in distress.Awake alert. Afebrile. Head is nontraumatic. Neck is supple without bruit.  . Cardiac exam no murmur or gallop. Lungs are clear to auscultation. Distal pulses are well felt. Neurological Exam : Highly limited as patient and family members in the room but not speaking. His awake alert able to speak in his language fluently. Extraocular movements are full range without nystagmus. Face is symmetric. Tongue is midline. Mild left hemiparesis  but able to hold upper  and lower extremity up against gravity. Sensation and coordination cannot be reliably tested. Gait was not tested.  ASSESSMENT [name] is a 80 y.o. male initially admitted for atrial fibrillation and heart failure, who then developed new-onset slurred speech and right-sided weakness in the hospital. MRI confirmed bilateral, multiple vascular territory infarcts felt to be embolic secondary to known atrial fibrillation and/or L ICA stenosis > 80%. On aspirin 81 mg orally every day and heparin at the time of consult. Now on aspirin 81 mg orally every day and heparin for  secondary stroke prevention.   Cardiology plans cath and possible PCI today.  L ICA stenosis > 80% Hypertension atrial fibrillation NSTEMI Acute CHF, felt to be secondary to atrial fibrillation and diastolic dysfunction Chronic renal insufficiency, stage III (moderate) PVD  Hyperlipidemia, LDL 144, on No statin PTA, now on lipitor 40 mg daily, goal LDL < 100   Hospital day # 6  TREATMENT/PLAN  Continue aspirin 81 mg orally every day and heparin for secondary stroke prevention for now.   Dr. Pearlean Brownie and Dr. Excell Seltzer also discussed long-term plans. Given possible PCI today and need to consideration of carotid stent (both of these procedures will need dual antiplatelets), pts advanced age and tenous medical condition, patient is not a good candidate for anticoagulation  And dual antiplatelet therapy together( which  he may need s/p PCI or carotid stent)   .Dr. Pearlean Brownie recommends consideration of carotid stent, discussed with Dr. Excell Seltzer who will consult Dr. Allyson Sabal. Recommend aspirin and plavix following PCI/ carotid stent without anticoagulation to decrease bleed risk  Home health PT and OT    Dr. Pearlean Brownie discussed diagnosis, prognosis,  treatment options and plan of care with pt's family at bedside. Interpreter communicated message.   Annie Main, MSN, RN, ANVP-BC, AGPCNP-BC [facility] Pager: [phone] 05/04/2013 10:12 AM  I have personally obtained a history, examined the patient, evaluated imaging results, and formulated the assessment and plan of care. I agree with the above.  Delia Heady  To contact Stroke Continuity provider, please refer to [email or web address]. After hours, contact General Neurology

## 2013-05-04 NOTE — Progress Notes (Signed)
OT Cancellation Note  Patient Details Name: [name] MRN: [id number] DOB: [birth year]   Cancelled Treatment:    Reason Eval/Treat Not Completed: Fatigue/lethargy limiting ability to participate. Pt to have cath this p.m. Will continue to follow.  Evern Bio 05/04/2013, 11:10 AM

## 2013-05-04 NOTE — Progress Notes (Signed)
Patient arrived from the cath lab and right radial site had some oozing.  Cath lab tech added 1 cc of air to TR band (11 total).  Will wait an additional 30 minutes and begin deflation. [place], Mitzi Hansen

## 2013-05-04 NOTE — Progress Notes (Signed)
ANTICOAGULATION CONSULT NOTE - Follow Up Consult  Pharmacy Consult for Heparin  Indication: chest pain/ACS, afib  Allergies  Allergen Reactions  . Phenazopyridine Hcl Other (See Comments)    unknown  . Septra [Sulfamethoxazole-Tmp Ds] Other (See Comments)    unknown    Patient Measurements: Height: 5\' 2"  (157.5 cm) Weight: 152 lb 1.9 oz (69 kg) IBW/kg (Calculated) : 54.6 Vital Signs: Temp: 98 F (36.7 C) (03/25 0800) Temp src: Oral (03/25 0800) BP: 130/73 mmHg (03/25 0800) Pulse Rate: 78 (03/25 0800)  Labs:  Recent Labs  05/02/13 0252  05/03/13 0900 05/03/13 1925 05/04/13 0317  HGB 13.7  --  13.2  --  12.5*  HCT 38.9*  --  39.1  --  36.9*  PLT 236  --  216  --  202  HEPARINUNFRC 0.32  < > <0.10* 0.28* 0.29*  CREATININE 1.87*  --  1.68*  --  1.72*  < > = values in this interval not displayed.  Estimated Creatinine Clearance: 27.3 ml/min (by C-G formula based on Cr of 1.72).  Medications:  Heparin 900 units/hr  Assessment: 80 y/o M on heparin for elevated trop/NSTEMI, afib and new CVA. Heparin level remains slightly low at 0.29. CBC is stable and no bleeding noted.   Goal of Therapy:  Heparin level 0.3-0.5 units/ml Monitor platelets by anticoagulation protocol: Yes   Plan:  1. Increase IV heparin to 950 units/hr - check an 8 hour heparin level 2. Continue daily CBC and heparin level 3. F/u plans for oral anticoagulation.  Lysle Pearl, PharmD, BCPS Pager # [phone] 05/04/2013 8:36 AM

## 2013-05-04 NOTE — Interval H&P Note (Signed)
History and Physical Interval Note:  05/04/2013 2:41 PM  [name]  has presented today for surgery, with the diagnosis of cp  The various methods of treatment have been discussed with the patient and family. After consideration of risks, benefits and other options for treatment, the patient has consented to  Procedure(s): LEFT HEART CATHETERIZATION WITH CORONARY ANGIOGRAM (N/A) as a surgical intervention .  The patient's history has been reviewed, patient examined, no change in status, stable for surgery.  I have reviewed the patient's chart and labs.  Questions were answered to the patient's satisfaction.   Cath Lab Visit (complete for each Cath Lab visit)  Clinical Evaluation Leading to the Procedure:   ACS: no  Non-ACS:    Anginal Classification: CCS II  Anti-ischemic medical therapy: Minimal Therapy (1 class of medications)  Non-Invasive Test Results: No non-invasive testing performed  Prior CABG: No previous CABG        Judi Cong 05/04/2013 2:41 PM'

## 2013-05-04 NOTE — Progress Notes (Signed)
ANTICOAGULATION CONSULT NOTE - Follow Up Consult  Pharmacy Consult for Heparin Indication: restart post-cath for severe 3 vessel CAD  Allergies  Allergen Reactions  . Phenazopyridine Hcl Other (See Comments)    unknown  . Septra [Sulfamethoxazole-Tmp Ds] Other (See Comments)    unknown   Patient Measurements: Height: 5\' 2"  (157.5 cm) Weight: 152 lb 1.9 oz (69 kg) IBW/kg (Calculated) : 54.6 Heparin Dosing Weight: 65.9 kg Vital Signs: Temp: 97.8 F (36.6 C) (03/25 1100) Temp src: Axillary (03/25 1100) BP: 127/71 mmHg (03/25 1645) Pulse Rate: 74 (03/25 1645)  Labs:  Recent Labs  05/02/13 0252  05/03/13 0900 05/03/13 1925 05/04/13 0317  HGB 13.7  --  13.2  --  12.5*  HCT 38.9*  --  39.1  --  36.9*  PLT 236  --  216  --  202  HEPARINUNFRC 0.32  < > <0.10* 0.28* 0.29*  CREATININE 1.87*  --  1.68*  --  1.72*  < > = values in this interval not displayed.  Estimated Creatinine Clearance: 27.3 ml/min (by C-G formula based on Cr of 1.72).  Medications:  Heparin @ 950 units/hr prior to cath  Assessment: 69 YOM s/p cath found to have severe CAD to resume heparin 8 hours post sheath removal. H/H slight decrease, platelets are stable. Patient is oozing at cath site. TR band removal was delayed. Per RN at 8:31 PM, removing TR band now.   Goal of Therapy:  Heparin level 0.3-0.7 units/ml Monitor platelets by anticoagulation protocol: Yes   Plan:  1. Restart heparin at 950 units/hr at 0430 AM as band was removed at 0831 PM. No bolus. 2. Heparin level 8 hours post restart of infusion.  3. Daily heparin level and CBC while on therapy.   Link Snuffer, PharmD, BCPS Clinical Pharmacist [phone] 05/04/2013,5:13 PM

## 2013-05-04 NOTE — H&P (View-Only) (Signed)
  Subjective: Sleeping quietly with no distress.  Objective: Vital signs in last 24 hours: Temp:  [97.4 F (36.3 C)-98.9 F (37.2 C)] 98 F (36.7 C) (03/25 0800) Pulse Rate:  [64-86] 78 (03/25 0800) Resp:  [17-25] 21 (03/25 0800) BP: (97-138)/(48-75) 130/73 mmHg (03/25 0800) SpO2:  [92 %-99 %] 97 % (03/25 0800) Weight:  [152 lb 1.9 oz (69 kg)] 152 lb 1.9 oz (69 kg) (03/25 0300) Last BM Date: 04/30/13  Intake/Output from previous day: 03/24 0701 - 03/25 0700 In: 200.4 [I.V.:200.4] Out: -  Intake/Output this shift:    Medications Current Facility-Administered Medications  Medication Dose Route Frequency Provider Last Rate Last Dose  . 0.9 %  sodium chloride infusion  250 mL Intravenous PRN Maddon Horton, MD      . 0.9 %  sodium chloride infusion   Intravenous Continuous Atha Muradyan, MD 75 mL/hr at 05/04/13 0746 75 mL/hr at 05/04/13 0746  . allopurinol (ZYLOPRIM) tablet 200 mg  200 mg Oral Daily Brian S Crenshaw, MD   200 mg at 05/04/13 0934  . alum & mag hydroxide-simeth (MAALOX/MYLANTA) 200-200-20 MG/5ML suspension 30 mL  30 mL Oral Q2H PRN Jacob Kelly, MD   30 mL at 05/01/13 2145  . aspirin EC tablet 81 mg  81 mg Oral Daily Bryan Hager, PA-C   81 mg at 05/04/13 0934  . atorvastatin (LIPITOR) tablet 40 mg  40 mg Oral q1800 Bryan Hager, PA-C   40 mg at 05/03/13 1728  . carvedilol (COREG) tablet 3.125 mg  3.125 mg Oral BID WC Ronit Marczak, MD   3.125 mg at 05/04/13 0934  . colchicine tablet 0.3 mg  0.3 mg Oral Daily Jennifer Danielle Rosebush, RPH   0.3 mg at 05/04/13 0934  . dicyclomine (BENTYL) tablet 20 mg  20 mg Oral QID Bryan Hager, PA-C   20 mg at 05/04/13 0934  . heparin ADULT infusion 100 units/mL (25000 units/250 mL)  950 Units/hr Intravenous Continuous Rachel Lynn Rumbarger, RPH 9.5 mL/hr at 05/04/13 0932 950 Units/hr at 05/04/13 0932  . influenza vac split quadrivalent PF (FLUARIX) injection 0.5 mL  0.5 mL Intramuscular Prior to discharge Brian S Crenshaw, MD        . nitroGLYCERIN (NITROSTAT) SL tablet 0.4 mg  0.4 mg Sublingual Q5 Min x 3 PRN Bryan Hager, PA-C      . ondansetron (ZOFRAN) injection 4 mg  4 mg Intravenous Q6H PRN Jacob Kelly, MD   4 mg at 05/01/13 0412  . pantoprazole (PROTONIX) EC tablet 40 mg  40 mg Oral Daily Bryan Hager, PA-C   40 mg at 05/04/13 0937  . sodium chloride 0.9 % injection 3 mL  3 mL Intravenous Q12H Tobias Avitabile, MD      . sodium chloride 0.9 % injection 3 mL  3 mL Intravenous PRN Freddye Cardamone, MD      . zolpidem (AMBIEN) tablet 5 mg  5 mg Oral QHS PRN Jacob Kelly, MD   5 mg at 05/01/13 2145    PE: General appearance: alert, cooperative and no distress,  Was sleeping. Lungs: clear to auscultation bilaterally  Neck: No JVD  Heart: regular rate and rhythm, S1, S2 normal, no murmur, click, rub or gallop  Abdomen: + BS. Nontender  Extremities: No LEE  Pulses: 2+ and symmetric  Skin: Warm and dry  Neurologic: Grossly normal. He does appear to have some right sided weakness    Lab Results:   Recent Labs  05/02/13 0252 05/03/13 0900 05/04/13 0317    WBC 7.4 6.7 7.3  HGB 13.7 13.2 12.5*  HCT 38.9* 39.1 36.9*  PLT 236 216 202   BMET  Recent Labs  05/02/13 0252 05/03/13 0900 05/04/13 0317  NA 135* 139 136*  K 3.9 4.2 4.3  CL 97 100 101  CO2 23 22 22   GLUCOSE 119* 95 95  BUN 25* 22 23  CREATININE 1.87* 1.68* 1.72*  CALCIUM 9.0 9.5 8.9   MRA/MRI IMPRESSION: Several small acute nonhemorrhagic infarcts in different vascular distributions as detailed above raising the possibility of embolic disease.  Intracranial atherosclerotic type changes as detailed above.   Assessment/Plan   Principal Problem:   Acute CHF- presume secondary to AF and diastolic dysfunction. (Nl LVF 2010)  Net fluids:  +0.2?/-1.0L.  Euvolemic.  No PO intake charted.  I wonder how much fluid he is taking in?  No diuretics.   EF is 35%  Active Problems:   Atrial fibrillation- unknown duration  Currently in sinus rhythm at  79bpm 1st degree AVB.  Still in and out of afib with controlled rate.  Coreg 3.125BID.  IV  Heparin.  Consider coumadin at discharge.  Education will be very important.    NSTEMI - ? type 2 - Troponin Peak 0.63  ASA, heparin.  Left and right heart caths today.      Chronic renal insufficiency, stage III (moderate)  SCr stable at 1.72.  Low of 1.42.  Gentle hydration after cath is recommended.   CVA (cerebral infarction)  Neuro following.  MRA: Several small acute nonhemorrhagic infarcts in different vascular distributions.  Pt  ambulated 200 feet yesterday with rolling walker and minimal assistance from PT.  Will consider HH PT at  discharge.   Hypertension  Bp Stable.  Coreg 3.125 BID.  No ACE or ARB due to A on CKD   Dyspnea  Improved   PVD (peripheral vascular disease) 3.5cm AAA Aug 2014    LOS: 6 days    HAGER, BRYAN PA-C 05/04/2013 11:09 AM  Patient seen, examined. Available data reviewed. Agree with findings, assessment, and plan as outlined by Wilburt Finlay, PA-C. The patient was independently interviewed and examined. I discussed his case with Dr. Pearlean Brownie of neurology and with multiple family members. He has now been found to have severe left internal carotid artery stenosis by Doppler ultrasound. Recommendations from neurology is that he undergo carotid revascularization prior to discharge. His situation is fairly complicated considering congestive heart failure, severe cardiomyopathy with LVEF less than 30%, advanced age, and atrial fibrillation. Bleeding risk of "triple therapy" is too high. He will likely be treated with dual antiplatelet therapy using aspirin and Plavix for a limited time (3 months) followed by anticoagulation thereafter. Will await results from his cardiac catheterization today. Would favor a limited contrast study because of renal insufficiency. I do not think he needs a right heart catheterization. Pending those results, will continue his medical therapy. I do not  think he would be a candidate for either cardiac surgery or carotid endarterectomy. After his cardiac catheterization, will ask Dr. Allyson Sabal to see him for consideration of carotid stenting.  Tonny Bollman, M.D. 05/04/2013 12:45 PM

## 2013-05-04 NOTE — Progress Notes (Signed)
PT Cancellation Note  Patient Details Name: [name] MRN: [id number] DOB: 15-Mar-1926   Cancelled Treatment:    Reason Eval/Treat Not Completed: Fatigue/lethargy limiting ability to participate.  Pt's family states he is sleeping for the first time and doesn't sleep much and does not want pt disturbed.  Going for cath at 1300.  Will return tomorrow.  Thanks.   INGOLD,Martinez Boxx 05/04/2013, 10:47 AM [facility] [phone] (pager)

## 2013-05-04 NOTE — CV Procedure (Signed)
    Cardiac Catheterization Procedure Note  Name: [name] MRN: [id number] DOB: 05-09-1926  Procedure: Left Heart Cath, Selective Coronary Angiography  Indication: 80 yo [place] male presents with a CVA and NSTEMI. He has a cardiomyopathy by Echo and has afib.    Procedural Details: The right wrist was prepped, draped, and anesthetized with 1% lidocaine. Using the modified Seldinger technique, a 5 French sheath was introduced into the right radial artery. 3 mg of verapamil was administered through the sheath, weight-based unfractionated heparin was administered intravenously. Standard Judkins catheters were used for selective coronary angiography and left ventricular pressure measurement.Catheter exchanges were performed over an exchange length guidewire. There were no immediate procedural complications. A TR band was used for radial hemostasis at the completion of the procedure.  The patient was transferred to the post catheterization recovery area for further monitoring. 45 cc of contrast used.   Procedural Findings: Hemodynamics: AO 125/72 mean 95 mm Hg LV 130/11 mm Hg  Coronary angiography: Coronary dominance: right  Left mainstem: There is 60-70% stenosis in the distal left main.   Left anterior descending (LAD): The LAD is a large vessel extending around the apex. In the mid vessel there is a 60-70% stenosis. The first diagonal is small in caliber with diffuse 80-90% disease in the mid vessel. The second diagonal is also small in caliber with 80% disease proximally.   Ramus intermediate: This is a large branch without significant disease.   Left circumflex (LCx): The LCx is small and gives rise to 2 small OM branches. There is 90% disease at the origin of the LCx. There is 80% disease at the bifurcation of the OMs.   Right coronary artery (RCA): The RCA is diffusely diseased in the proximal vessel and 100% occluded in the mid vessel. There are right to right and left to right  collaterals to the distal RCA.  Left ventriculography: No done.   Final Conclusions:   1. 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 His most severe disease is in the small diagonal and LCx branches.  2. Normal LV EDP.  Recommendations: Given age and co-morbidities I would recommend medical therapy.   [name] [facility] 05/04/2013, 3:12 PM

## 2013-05-05 DIAGNOSIS — I779 Disorder of arteries and arterioles, unspecified: Secondary | ICD-10-CM

## 2013-05-05 DIAGNOSIS — I739 Peripheral vascular disease, unspecified: Secondary | ICD-10-CM

## 2013-05-05 LAB — BASIC METABOLIC PANEL
BUN: 17 mg/dL (ref 6–23)
CALCIUM: 8.7 mg/dL (ref 8.4–10.5)
CO2: 21 mEq/L (ref 19–32)
Chloride: 103 mEq/L (ref 96–112)
Creatinine, Ser: 1.66 mg/dL — ABNORMAL HIGH (ref 0.50–1.35)
GFR, EST AFRICAN AMERICAN: 42 mL/min — AB (ref >90)
GFR, EST NON AFRICAN AMERICAN: 36 mL/min — AB (ref >90)
GLUCOSE: 106 mg/dL — AB (ref 70–99)
POTASSIUM: 3.9 meq/L (ref 3.7–5.3)
Sodium: 137 mEq/L (ref 137–147)

## 2013-05-05 LAB — HEPARIN LEVEL (UNFRACTIONATED)
Heparin Unfractionated: 0.47 IU/mL (ref 0.30–0.70)
Heparin Unfractionated: 0.71 IU/mL — ABNORMAL HIGH (ref 0.30–0.70)

## 2013-05-05 LAB — CBC
HCT: 35 % — ABNORMAL LOW (ref 39.0–52.0)
HEMOGLOBIN: 11.8 g/dL — AB (ref 13.0–17.0)
MCH: 29.4 pg (ref 26.0–34.0)
MCHC: 33.7 g/dL (ref 30.0–36.0)
MCV: 87.3 fL (ref 78.0–100.0)
Platelets: 193 10*3/uL (ref 150–400)
RBC: 4.01 MIL/uL — ABNORMAL LOW (ref 4.22–5.81)
RDW: 14.3 % (ref 11.5–15.5)
WBC: 5.8 10*3/uL (ref 4.0–10.5)

## 2013-05-05 MED ORDER — CLOPIDOGREL BISULFATE 75 MG PO TABS
75.0000 mg | ORAL_TABLET | Freq: Every day | ORAL | Status: DC
  Administered 2013-05-06 – 2013-05-11 (×6): 75 mg via ORAL
  Filled 2013-05-05 (×8): qty 1

## 2013-05-05 MED ORDER — HYDRALAZINE HCL 10 MG PO TABS
10.0000 mg | ORAL_TABLET | Freq: Three times a day (TID) | ORAL | Status: DC
  Filled 2013-05-05 (×4): qty 1

## 2013-05-05 MED ORDER — CLOPIDOGREL BISULFATE 75 MG PO TABS
300.0000 mg | ORAL_TABLET | Freq: Once | ORAL | Status: AC
  Administered 2013-05-05: 300 mg via ORAL
  Filled 2013-05-05: qty 4

## 2013-05-05 NOTE — Progress Notes (Signed)
ANTICOAGULATION CONSULT NOTE - Follow Up Consult  Pharmacy Consult for Heparin Indication: restart post-cath for severe 3 vessel CAD  Allergies  Allergen Reactions  . Phenazopyridine Hcl Other (See Comments)    unknown  . Septra [Sulfamethoxazole-Tmp Ds] Other (See Comments)    unknown   Patient Measurements: Height: 5\' 2"  (157.5 cm) Weight: 152 lb 1.9 oz (69 kg) IBW/kg (Calculated) : 54.6 Heparin Dosing Weight: 65.9 kg  Vital Signs: Temp: 97.8 F (36.6 C) (03/26 1139) Temp src: Oral (03/26 1139) BP: 115/54 mmHg (03/26 1139) Pulse Rate: 75 (03/26 1139)  Labs:  Recent Labs  05/03/13 0900 05/03/13 1925 05/04/13 0317 05/05/13 0547 05/05/13 1149  HGB 13.2  --  12.5* 11.8*  --   HCT 39.1  --  36.9* 35.0*  --   PLT 216  --  202 193  --   HEPARINUNFRC <0.10* 0.28* 0.29*  --  0.47  CREATININE 1.68*  --  1.72* 1.66*  --     Estimated Creatinine Clearance: 28.3 ml/min (by C-G formula based on Cr of 1.66).  Medications:  . heparin 950 Units/hr (05/05/13 0438)    Assessment: 86 YOM on a heparin drip s/p cath for multivessel disease and Afib. Plan is for dual antiplatelet therapy. Also with severe L ICA stenosis with plan to stent prior to discharge. Heparin level is therapeutic at 0.47. No bleeding noted, Hb low at 11.8, platelets stable.  Goal of Therapy:  Heparin level 0.3-0.7 units/ml Monitor platelets by anticoagulation protocol: Yes   Plan:  - Continue heparin drip at 950 units/hr - Confirmatory heparin level at 18:00 - Daily heparin level and CBC - Monitor for s/sx of bleeding - F/U plan for anticoagulation  [facility], Pharm.D., BCPS Clinical Pharmacist Pager: [phone] 05/05/2013 1:33 PM

## 2013-05-05 NOTE — Progress Notes (Signed)
ANTICOAGULATION CONSULT NOTE - Follow Up Consult  Pharmacy Consult for Heparin Indication: restart post-cath for severe 3 vessel CAD  Allergies  Allergen Reactions  . Phenazopyridine Hcl Other (See Comments)    unknown  . Septra [Sulfamethoxazole-Tmp Ds] Other (See Comments)    unknown   Patient Measurements: Height: 5\' 2"  (157.5 cm) Weight: 150 lb 11.2 oz (68.357 kg) IBW/kg (Calculated) : 54.6 Heparin Dosing Weight: 65.9 kg  Vital Signs: Temp: 97.8 F (36.6 C) (03/26 2126) Temp src: Oral (03/26 2126) BP: 121/57 mmHg (03/26 2126) Pulse Rate: 70 (03/26 2126)  Labs:  Recent Labs  05/03/13 0900  05/04/13 0317 05/05/13 0547 05/05/13 1149 05/05/13 2056  HGB 13.2  --  12.5* 11.8*  --   --   HCT 39.1  --  36.9* 35.0*  --   --   PLT 216  --  202 193  --   --   HEPARINUNFRC <0.10*  < > 0.29*  --  0.47 0.71*  CREATININE 1.68*  --  1.72* 1.66*  --   --   < > = values in this interval not displayed.  Estimated Creatinine Clearance: 28.2 ml/min (by C-G formula based on Cr of 1.66).  Medications:  . heparin 950 Units/hr (05/05/13 0438)    Assessment: 86 YOM on a heparin drip s/p cath for multivessel disease and Afib. Pt with severe L ICA stenosis with plan to stent prior to discharge. Heparin level is slightly supratherapeutic at 0.71. No bleeding noted.  Goal of Therapy:  Heparin level 0.3-0.7 units/ml Monitor platelets by anticoagulation protocol: Yes   Plan:  - Decrease heparin drip slightly to 900 units/hr - Daily heparin level and CBC  Christoper Fabian, PharmD, BCPS Clinical pharmacist, pager [phone] 05/05/2013 10:07 PM

## 2013-05-05 NOTE — Progress Notes (Signed)
Occupational Therapy Treatment Patient Details Name: [name] MRN: [id number] DOB: January 07, 1927 Today's Date: 05/05/2013    History of present illness Pt is an 80 yo male that does not speak English admitted initially for afib and CHF.  While in the hospital pt  began to have R side weakness and slurred speech on 3/22.  Pt has questionable L subcortical infart and is waiting for an MRI per neurology.  Translator in room and some of pts family speaks [place].     OT comments  Pt making progress with functional goals and should continue with acute OT services to increase level of function and safety  Follow Up Recommendations  Home health OT;Supervision/Assistance - 24 hour    Equipment Recommendations  None recommended by OT    Recommendations for Other Services      Precautions / Restrictions Precautions Precautions: Fall Restrictions Weight Bearing Restrictions: No       Mobility Bed Mobility Overal bed mobility: Needs Assistance Bed Mobility: Supine to Sit     Supine to sit: Min assist     General bed mobility comments: assist with LEs  Transfers Overall transfer level: Needs assistance Equipment used: Rolling walker (2 wheeled)   Sit to Stand: Min guard         General transfer comment: cues for hand placement    Balance Overall balance assessment: Needs assistance Sitting-balance support: No upper extremity supported;Feet supported Sitting balance-Leahy Scale: Good     Standing balance support: Single extremity supported;Bilateral upper extremity supported;During functional activity Standing balance-Leahy Scale: Fair                     ADL   Grooming: Wash/dry hands;Wash/dry face;Standing       Lower Body Dressing: Min guard;Minimal assistance Toilet Transfer: Min guard;Ambulation;Regular Toilet;Grab bars Toileting- Clothing Manipulation and Hygiene: Minimal assistance;Sit to/from stand                                             Cognition   Behavior During Therapy: [facility] for tasks assessed/performed Overall Cognitive Status: Within Functional Limits for tasks assessed                                      Exercises General Exercises - Upper Extremity Shoulder Flexion: Theraband;Seated;AROM;Strengthening;10 reps;Right Shoulder Extension: AROM;Right;Seated;Theraband Shoulder ABduction: AROM;Strengthening;Right;Seated;Theraband Elbow Flexion: AROM;Strengthening;Right;Seated;10 reps Elbow Extension: AROM;Strengthening;Seated;Right;Theraband           Pertinent Vitals/ Pain       No c/o pain                                            Prior Functioning/Environment   independent           Frequency Min 2X/week     Progress Toward Goals  OT Goals(current goals can now be found in the care plan section)  Progress towards OT goals: Progressing toward goals     Plan Discharge plan remains appropriate    End of Session    Activity Tolerance Patient tolerated treatment well   Patient Left with call bell/phone within reach;with family/visitor present;in bed   Nurse Communication  Time:  -     Charges: OT General Charges $OT Visit: 1 Procedure OT Treatments $Self Care/Home Management : 8-22 mins $Therapeutic Exercise: 8-22 mins  Galen ManilaSpencer, Bao Coreas Jeanette 05/05/2013, 3:04 PM

## 2013-05-05 NOTE — Progress Notes (Signed)
Subjective:  No chest pain or shortness of breath. History extremely limited.  Objective:  Vital Signs in the last 24 hours: Temp:  [97.8 F (36.6 C)-99.5 F (37.5 C)] 99.5 F (37.5 C) (03/26 0000) Pulse Rate:  [72-94] 94 (03/26 0000) Resp:  [20-21] 20 (03/25 1612) BP: (95-150)/(58-87) 148/77 mmHg (03/26 0000) SpO2:  [96 %-100 %] 98 % (03/26 0000)  Intake/Output from previous day: 03/25 0701 - 03/26 0700 In: 591 [I.V.:591] Out: -   Physical Exam: Pt is alert elderly male in NAD HEENT: normal Neck: JVP - normal Lungs: CTA bilaterally CV: RRR without murmur or gallop Abd: soft, NT, Positive BS, no hepatomegaly Ext: no C/C/E, distal pulses intact and equal Skin: warm/dry no rash   Lab Results:  Recent Labs  05/04/13 0317 05/05/13 0547  WBC 7.3 5.8  HGB 12.5* 11.8*  PLT 202 193    Recent Labs  05/04/13 0317 05/05/13 0547  NA 136* 137  K 4.3 3.9  CL 101 103  CO2 22 21  GLUCOSE 95 106*  BUN 23 17  CREATININE 1.72* 1.66*   No results found for this basename: TROPONINI, CK, MB,  in the last 72 hours  Cardiac Studies: Cardiac cath: Procedural Findings:  Hemodynamics:  AO 125/72 mean 95 mm Hg  LV 130/11 mm Hg  Coronary angiography:  Coronary dominance: right  Left mainstem: There is 60-70% stenosis in the distal left main.  Left anterior descending (LAD): The LAD is a large vessel extending around the apex. In the mid vessel there is a 60-70% stenosis. The first diagonal is small in caliber with diffuse 80-90% disease in the mid vessel. The second diagonal is also small in caliber with 80% disease proximally.  Ramus intermediate: This is a large branch without significant disease.  Left circumflex (LCx): The LCx is small and gives rise to 2 small OM branches. There is 90% disease at the origin of the LCx. There is 80% disease at the bifurcation of the OMs.  Right coronary artery (RCA): The RCA is diffusely diseased in the proximal vessel and 100%  occluded in the mid vessel. There are right to right and left to right collaterals to the distal RCA.  Left ventriculography: No done.  Final Conclusions:  1. 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 His most severe disease is in the small diagonal and LCx branches.  2. Normal LV EDP.  Recommendations: Given age and co-morbidities I would recommend medical therapy.  [name] [facility]  05/04/2013, 3:12 PM   Carotid US: Summary: There is mild mixed plaque in the right ECA. RICA velocities are in the 1-39% range. On the left there is severe mixedplaque in the bulb, ECA and ICA. LICA velocities are severely elevated in the proximal LICA, with post-stenotic turbulence and dilation. By color Doppler, there is near string sign flow in the proximal LICA. The distal LICA is patent. LICA velocities are in the 80-99% range. The LECA has severe mixed plaque withshadowing, possibly occluded.The vertebral arteries are patent with antegrade flow, bilaterally. The RICA ratio is .97 and the LICA ratio is 20.4.  Other specific details can be found in the table(s) above. Prepared and Electronically Authenticated by  Delia Heady 2015-03-25T10:[id number]  Tele: Sinus rhythm, personally reviewed.  Assessment/Plan:  1. Atrial fibrillation, currently maintaining sinus rhythm. Patient is on low-dose carvedilol. He will be managed with dual antiplatelet therapy (see below).  2. Non-ST elevation MI. Suspect type II event in the setting of stable  but significant CAD. Cardiac catheterization reviewed yesterday. The patient has chronic occlusion of the right coronary artery, moderate left mainstem stenosis, and severe small vessel disease. Medical therapy has been recommended and I agree with this approach. He will be treated with aspirin and Plavix. The patient is on statin and low-dose beta blocker.  3. Acute systolic heart failure. Severe LV dysfunction noted. 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 No ACE or ARB secondary to chronic kidney disease. Will add low-dose hydralazine today but need to be cautious not to drop this patient's blood pressure in the setting of severe carotid stenosis.  4. Severe left internal carotid artery stenosis. Velocities approximately 600/300. Carotid stenting recommended by neurology team to be done prior to discharge. Will go ahead and start the patient on Plavix today. Dr. Allyson SabalBerry to see.  5. Stroke. Management per neurology team. Appreciate their input.  [name] 05/05/2013 6:42 AM    [name], M.D. 05/05/2013, 6:31 AM

## 2013-05-05 NOTE — Progress Notes (Signed)
Stroke Team Progress Note  HISTORY [name] is an 80 y.o. male with a history of heart failure, hypertension and abdominal aortic aneurysm was admitted on 04/28/2013 for management of atrial fibrillation and heart failure. Patient is currently on heparin drip for anticoagulation. At noon on 05/01/2013 his daughter noticed that he had difficulty feeding himself and she was unable to understand his speech. There is no previous history of stroke. NIH stroke score was 5. CT scan of his head showed no acute intracranial abnormality including no signs of acute infarction or acute stroke.  tPA was not considered as pt was anticoagulated on IV heparin.   SUBJECTIVE Four family members including English speaking daughter and wife are at the bedside. Daughter is translating for the rest of the family.  OBJECTIVE Most recent Vital Signs: Filed Vitals:   05/04/13 2231 05/05/13 0000 05/05/13 0400 05/05/13 0756  BP: 150/75 148/77 95/47 95/43   Pulse:  94 75 73  Temp:  99.5 F (37.5 C) 98.9 F (37.2 C) 97.7 F (36.5 C)  TempSrc:    Oral  Resp:    19  Height:      Weight:      SpO2:  98% 90% 100%   CBG (last 3)   Recent Labs  05/04/13 2019  GLUCAP 91    IV Fluid Intake:   . heparin 950 Units/hr (05/05/13 0438)    MEDICATIONS  . allopurinol  200 mg Oral Daily  . aspirin EC  81 mg Oral Daily  . atorvastatin  40 mg Oral q1800  . carvedilol  3.125 mg Oral BID WC  . [START ON 05/06/2013] clopidogrel  75 mg Oral Q breakfast  . colchicine  0.3 mg Oral Daily  . dicyclomine  20 mg Oral QID  . pantoprazole  40 mg Oral Daily   PRN:  acetaminophen, alum & mag hydroxide-simeth, guaiFENesin, influenza vac split quadrivalent PF, levalbuterol, nitroGLYCERIN, ondansetron (ZOFRAN) IV, zolpidem  Diet:  Dysphagia 2 thin liquids Activity:  OOB with assistance DVT Prophylaxis:  IV heparin  CLINICALLY SIGNIFICANT STUDIES Basic Metabolic Panel:   Recent Labs Lab 05/04/13 0317 05/05/13 0547   NA 136* 137  K 4.3 3.9  CL 101 103  CO2 22 21  GLUCOSE 95 106*  BUN 23 17  CREATININE 1.72* 1.66*  CALCIUM 8.9 8.7   Liver Function Tests:   Recent Labs Lab 04/28/13 1050  AST 25  ALT 15  ALKPHOS 75  BILITOT 0.5  PROT 7.5  ALBUMIN 3.5   CBC:  Recent Labs Lab 04/28/13 1050  05/04/13 0317 05/05/13 0547  WBC 9.3  < > 7.3 5.8  NEUTROABS 7.1  --   --   --   HGB 13.0  < > 12.5* 11.8*  HCT 38.1*  < > 36.9* 35.0*  MCV 86.4  < > 87.2 87.3  PLT 225  < > 202 193  < > = values in this interval not displayed. Coagulation:   Recent Labs Lab 04/28/13 1050  LABPROT 12.3  INR 0.93   Cardiac Enzymes:   Recent Labs Lab 04/28/13 1606 04/28/13 2304 04/29/13 0326  TROPONINI 0.63* 0.43* 0.39*   Urinalysis: No results found for this basename: COLORURINE, APPERANCEUR, LABSPEC, PHURINE, GLUCOSEU, HGBUR, BILIRUBINUR, KETONESUR, PROTEINUR, UROBILINOGEN, NITRITE, LEUKOCYTESUR,  in the last 168 hours Lipid Panel    Component Value Date/Time   CHOL 206* 04/29/2013 0326   TRIG 114 04/29/2013 0326   HDL 39* 04/29/2013 0326   CHOLHDL 5.3 04/29/2013 0326   VLDL  23 04/29/2013 0326   LDLCALC 144* 04/29/2013 0326   HgbA1C  Lab Results  Component Value Date   HGBA1C 6.0* 05/03/2013    Urine Drug Screen:   No results found for this basename: labopia,  cocainscrnur,  labbenz,  amphetmu,  thcu,  labbarb    Alcohol Level: No results found for this basename: ETH,  in the last 168 hours   CT of the brain  05/01/2013    1. No acute intracranial abnormalities. 2. Mild cerebral atrophy. 3. Chronic microvascular ischemic changes in the cerebral white matter.     MRI of the brain  05/02/2013 Several small acute nonhemorrhagic infarcts in different vascular distributions as detailed above raising the possibility of embolic disease.   MRA of the brain  05/02/2013 Intracranial atherosclerotic type changes   2D Echocardiogram  Diffuse hypokineisis worse in the inferior wall and apex The cavity  size was mildly dilated. Wall thickness was normal. The estimated ejection fraction was 35%. Moderate MV regurgitation.  Carotid Doppler  RICA velocities 1-39% and the LICA velocities are 80-99%. Vertebral artery flow is patent and flow is antegrade, bilaterally.   CXR  04/28/2013 Increased interstitial markings bilaterally are worrisome for  interstitial edema of cardiac or noncardiac calls. One cannot exclude interstitial pneumonia in the appropriate clinical setting.  A followup PA and lateral chest x-ray would be of value when the patient can tolerate the procedure.  EKG  atrial fibrillation. For complete results please see formal report.   Therapy Recommendations HH PT, OT  Physical Exam   Frail elderly sian male not in distress.Awake alert. Afebrile. Head is nontraumatic. Neck is supple without bruit.  . Cardiac exam no murmur or gallop. Lungs are clear to auscultation. Distal pulses are well felt. Neurological Exam : Highly limited as patient and family members in the room but not speaking. His awake alert able to speak in his language fluently. Extraocular movements are full range without nystagmus. Face is symmetric. Tongue is midline. Mild left hemiparesis  but able to hold upper  and lower extremity up against gravity. Sensation and coordination cannot be reliably tested. Gait was not tested.  ASSESSMENT [name] is a 80 y.o. male initially admitted for atrial fibrillation and heart failure, who then developed new-onset slurred speech and right-sided weakness in the hospital. MRI confirmed bilateral, multiple vascular territory infarcts felt to be embolic secondary to known atrial fibrillation and/or L ICA stenosis > 80%. Carotid stent to be considered. On aspirin 81 mg orally every day and heparin at the time of consult. Now on aspirin 81 mg orally every day and heparin for secondary stroke prevention.   L ICA stenosis > 80% Hypertension atrial fibrillation NSTEMI - cath  with diseast - medical management only Acute CHF, felt to be secondary to atrial fibrillation and diastolic dysfunction Chronic renal insufficiency, stage III (moderate) PVD  Hyperlipidemia, LDL 144, on No statin PTA, now on lipitor 40 mg daily, goal LDL < 100   Hospital day # 7  TREATMENT/PLAN  Continue aspirin 81 mg orally every day and heparin for secondary stroke prevention for now. Plavix added today as discussed with Dr. Excell Seltzer. No plan for oral anticoagulation at this time due to risk of bleeding on dual antiplatelets planned at discharge for carotid stenting.    Sethi and Dr. Excell Seltzer discussed medical treatment and plan of care  Dr. Allyson Sabal to see tonight related to carotid stent  Home health PT and OT  Dr. Pearlean Brownie discussed diagnosis, prognosis,  treatment options and plan of care with pt's family at bedside. Interpreter communicated message.   Annie MainSharon Biby, MSN, RN, ANVP-BC, AGPCNP-BC [facility] Pager: [phone] 05/05/2013 10:40 AM  I have personally obtained a history, examined the patient, evaluated imaging results, and formulated the assessment and plan of care. I agree with the above. Delia HeadyPramod Sethi  To contact Stroke Continuity provider, please refer to [email or web address]. After hours, contact General Neurology

## 2013-05-05 NOTE — Progress Notes (Signed)
Physical Therapy Treatment Patient Details Name: [name] MRN: [id number] DOB: [birth year] Today's Date: 13-May-2013    History of Present Illness Pt is an 80 yo male that does not speak English admitted initially for afib and CHF.  While in the hospital pt  began to have R side weakness and slurred speech on 3/22.  Pt has questionable L subcortical infart and is waiting for an MRI per neurology.  Translator in room and some of pts family speaks [place].      PT Comments    Pt continues to be limited by fatigue, requires 3 x PT coming by before agreeable to therapy.  Pt min A with mobility and gait, will benefit from continued PT for strength and activity tolerance.  Able to gait on room air with spO2 98%  Follow Up Recommendations  Home health PT;Supervision/Assistance - 24 hour     Equipment Recommendations  Rolling walker with 5" wheels    Recommendations for Other Services       Precautions / Restrictions Precautions Precautions: Fall Restrictions Weight Bearing Restrictions: No    Mobility  Bed Mobility         Supine to sit: Min assist        Transfers   Equipment used: Rolling walker (2 wheeled)   Sit to Stand: Min guard         General transfer comment: cues for hand placement  Ambulation/Gait Ambulation/Gait assistance: Min guard Ambulation Distance (Feet): 150 Feet Assistive device: Rolling walker (2 wheeled)       General Gait Details: cues to stay close to RW, 1 standing rest break due to fatigue.  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)       Balance                                    Cognition Arousal/Alertness: Awake/alert Behavior During Therapy: WFL for tasks assessed/performed Overall Cognitive Status: Within Functional Limits for tasks assessed                      Exercises      General Comments        Pertinent Vitals/Pain No c/o pain, spO2 98% on room air with gait    Home Living                      Prior Function            PT Goals (current goals can now be found in the care plan section) Progress towards PT goals: Progressing toward goals    Frequency  Min 3X/week    PT Plan Current plan remains appropriate    End of Session Equipment Utilized During Treatment: Gait belt Activity Tolerance: Patient limited by fatigue Patient left: in bed;with call bell/phone within reach;with family/visitor present     Time: 6837-2902 PT Time Calculation (min): 15 min  Charges:  $Gait Training: 8-22 mins                    G Codes:      Zoha Spranger 2013/05/13, 1:43 PM

## 2013-05-05 NOTE — Consult Note (Signed)
Reason for Consult: Carotid artery stent  Requesting Physician: Dr. Burt Knack  HPI:  [name] is a 80 year old married [place] male admitted for congestive heart failure. He was found to be in atrial fibrillation and apparently converted to sinus rhythm. The echocardiogram revealed severe dysfunction with an ejection fraction in the 35% range. He had stroke like symptoms and was evaluated by the stroke service. An MRI suggested multiple embolic strokes possibly related to the atial fibrillation.carotid Dopplers were performed and suggested high-grade left internal carotid artery stenosis with string sign". The patient underwent cardiac catheterization by Dr. [place] the right radial approach that revealed 80% left main stenosis with an occluded dominant RCA.. Consensus was to treat him medically for this. I was asked to see the patient for consideration of left internal carotid artery stenting and endarterectomy was high risk given his comorbidities.  PMHx:  Past Medical History  Diagnosis Date  . Hypertension   . Gout   . Hard of hearing   . Anemia   . AAA (abdominal aortic aneurysm)   . Depression   . Hyperplasia, prostate   . Asthma   . H. pylori infection   . PUD (peptic ulcer disease)   . Hiatal hernia   . Renal failure, acute 11/08/2012   Past Surgical History  Procedure Laterality Date  . Esophagogastroduodenoscopy  06/13/2008,12/31/12    FAMHx: Family History  Problem Relation Age of Onset  . Colon cancer Neg Hx     SOCHx:  reports that he quit smoking about 11 years ago. He has never used smokeless tobacco. He reports that he does not drink alcohol or use illicit drugs.  ALLERGIES: Allergies  Allergen Reactions  . Phenazopyridine Hcl Other (See Comments)    unknown  . Septra [Sulfamethoxazole-Tmp Ds] Other (See Comments)    unknown    ROS: Pertinent items are noted in HPI.  HOME MEDICATIONS: Prescriptions prior to admission  Medication Sig Dispense  Refill  . allopurinol (ZYLOPRIM) 300 MG tablet Take 300 mg by mouth daily.      . colchicine 0.6 MG tablet Take 1 tablet (0.6 mg total) by mouth 2 (two) times daily.  10 tablet  0  . dicyclomine (BENTYL) 20 MG tablet Take 20 mg by mouth 4 (four) times daily.      [name] esomeprazole (NEXIUM) 40 MG capsule Take 40 mg by mouth daily before breakfast.         HOSPITAL MEDICATIONS: I have reviewed the patient's current medications.  VITALS: Blood pressure 115/62, pulse 77, temperature 98.4 F (36.9 C), temperature source Oral, resp. rate 16, height _0  (1.575 m), weight 150 lb 11.2 oz (68.357 kg), SpO2 100.00%.  PHYSICAL EXAM: General appearance: alert, cooperative and no distress Neck: no adenopathy, no carotid bruit, no JVD, supple, symmetrical, trachea midline and thyroid not enlarged, symmetric, no tenderness/mass/nodules Lungs: clear to auscultation bilaterally Heart: regular rate and rhythm, S1, S2 normal, no murmur, click, rub or gallop Abdomen: soft, non-tender; bowel sounds normal; no masses,  no organomegaly Extremities: extremities normal, atraumatic, no cyanosis or edema  LABS: Results for orders placed during the hospital encounter of 04/28/13 (from the past 48 hour(s))  HEPARIN LEVEL (UNFRACTIONATED)     Status: Abnormal   Collection Time    05/03/13  7:25 PM      Result Value Ref Range   Heparin Unfractionated 0.28 (*) 0.30 - 0.70 IU/mL   Comment:  IF HEPARIN RESULTS ARE BELOW     EXPECTED VALUES, AND PATIENT     DOSAGE HAS BEEN CONFIRMED,     SUGGEST FOLLOW UP TESTING     OF ANTITHROMBIN III LEVELS.  CBC     Status: Abnormal   Collection Time    05/04/13  3:17 AM      Result Value Ref Range   WBC 7.3  4.0 - 10.5 K/uL   RBC 4.23  4.22 - 5.81 MIL/uL   Hemoglobin 12.5 (*) 13.0 - 17.0 g/dL   HCT 36.9 (*) 39.0 - 52.0 %   MCV 87.2  78.0 - 100.0 fL   MCH 29.6  26.0 - 34.0 pg   MCHC 33.9  30.0 - 36.0 g/dL   RDW 14.2  11.5 - 15.5 %   Platelets 202  150 - 400  K/uL  HEPARIN LEVEL (UNFRACTIONATED)     Status: Abnormal   Collection Time    05/04/13  3:17 AM      Result Value Ref Range   Heparin Unfractionated 0.29 (*) 0.30 - 0.70 IU/mL   Comment:            IF HEPARIN RESULTS ARE BELOW     EXPECTED VALUES, AND PATIENT     DOSAGE HAS BEEN CONFIRMED,     SUGGEST FOLLOW UP TESTING     OF ANTITHROMBIN III LEVELS.  BASIC METABOLIC PANEL     Status: Abnormal   Collection Time    05/04/13  3:17 AM      Result Value Ref Range   Sodium 136 (*) 137 - 147 mEq/L   Potassium 4.3  3.7 - 5.3 mEq/L   Chloride 101  96 - 112 mEq/L   CO2 22  19 - 32 mEq/L   Glucose, Bld 95  70 - 99 mg/dL   BUN 23  6 - 23 mg/dL   Creatinine, Ser 1.72 (*) 0.50 - 1.35 mg/dL   Calcium 8.9  8.4 - 10.5 mg/dL   GFR calc non Af Amer 35 (*) >90 mL/min   GFR calc Af Amer 40 (*) >90 mL/min   Comment: (NOTE)     The eGFR has been calculated using the CKD EPI equation.     This calculation has not been validated in all clinical situations.     eGFR's persistently <90 mL/min signify possible Chronic Kidney     Disease.  GLUCOSE, CAPILLARY     Status: None   Collection Time    05/04/13  8:19 PM      Result Value Ref Range   Glucose-Capillary 91  70 - 99 mg/dL  CBC     Status: Abnormal   Collection Time    05/05/13  5:47 AM      Result Value Ref Range   WBC 5.8  4.0 - 10.5 K/uL   RBC 4.01 (*) 4.22 - 5.81 MIL/uL   Hemoglobin 11.8 (*) 13.0 - 17.0 g/dL   HCT 35.0 (*) 39.0 - 52.0 %   MCV 87.3  78.0 - 100.0 fL   MCH 29.4  26.0 - 34.0 pg   MCHC 33.7  30.0 - 36.0 g/dL   RDW 14.3  11.5 - 15.5 %   Platelets 193  150 - 400 K/uL  BASIC METABOLIC PANEL     Status: Abnormal   Collection Time    05/05/13  5:47 AM      Result Value Ref Range   Sodium 137  137 - 147 mEq/L  Potassium 3.9  3.7 - 5.3 mEq/L   Chloride 103  96 - 112 mEq/L   CO2 21  19 - 32 mEq/L   Glucose, Bld 106 (*) 70 - 99 mg/dL   BUN 17  6 - 23 mg/dL   Creatinine, Ser 1.66 (*) 0.50 - 1.35 mg/dL   Calcium 8.7  8.4 -  10.5 mg/dL   GFR calc non Af Amer 36 (*) >90 mL/min   GFR calc Af Amer 42 (*) >90 mL/min   Comment: (NOTE)     The eGFR has been calculated using the CKD EPI equation.     This calculation has not been validated in all clinical situations.     eGFR's persistently <90 mL/min signify possible Chronic Kidney     Disease.  HEPARIN LEVEL (UNFRACTIONATED)     Status: None   Collection Time    05/05/13 11:49 AM      Result Value Ref Range   Heparin Unfractionated 0.47  0.30 - 0.70 IU/mL   Comment:            IF HEPARIN RESULTS ARE BELOW     EXPECTED VALUES, AND PATIENT     DOSAGE HAS BEEN CONFIRMED,     SUGGEST FOLLOW UP TESTING     OF ANTITHROMBIN III LEVELS.    IMAGING: Dg Chest Port 1v Same Day  05/04/2013   CLINICAL DATA:  Increased wheezing.  Shortness of breath.  EXAM: PORTABLE CHEST - 1 VIEW SAME DAY  COMPARISON:  Chest x-ray 04/28/2013.  FINDINGS: Previously noted min discretion previously noted mild interstitial pulmonary edema has resolved. No consolidative airspace disease. No pleural effusions. Heart size is within normal limits. The patient is rotated to the left on today's exam, resulting in distortion of the mediastinal contours and reduced diagnostic sensitivity and specificity for mediastinal pathology. Atherosclerosis in the thoracic aorta.  IMPRESSION: 1. Resolution of previously noted mild congestive heart failure. No new acute findings. 2. Atherosclerosis.   Electronically Signed   By: Vinnie Langton M.D.   On: 05/04/2013 21:00    IMPRESSION: Carotid artery disease-patient had strokelike symptoms and was evaluated by the stroke service. MRI confirmed strokes in  multiple vascular territories and carotid Doppler suggested a high-grade left internal carotid artery stenosis. The patient is on dual antiplatelet therapy . I agree that carotid endarterectomy is extremely high risk and probably not an option in this case. Carotid stenting is an option though a high-risk procedure  as well given his age, moderate renal insufficiency, left main/vessel disease and severe left ventricular dysfunction.   RECOMMENDATION: I discussed the situation with the patient's children who speak [place]. A [place] interpreter  Was present during the interview as well. I have described the risks and benefits of medical therapy versus intervention. They will discuss the options amongst themselves and arrive at a decision by tomorrow.   Time Spent Directly with Patient: 30 minutes  Dilon Lank J 05/05/2013, 6:38 PM

## 2013-05-06 LAB — CBC
HCT: 34 % — ABNORMAL LOW (ref 39.0–52.0)
Hemoglobin: 11.6 g/dL — ABNORMAL LOW (ref 13.0–17.0)
MCH: 29.3 pg (ref 26.0–34.0)
MCHC: 34.1 g/dL (ref 30.0–36.0)
MCV: 85.9 fL (ref 78.0–100.0)
Platelets: 186 10*3/uL (ref 150–400)
RBC: 3.96 MIL/uL — ABNORMAL LOW (ref 4.22–5.81)
RDW: 14 % (ref 11.5–15.5)
WBC: 4.8 10*3/uL (ref 4.0–10.5)

## 2013-05-06 LAB — BASIC METABOLIC PANEL
BUN: 12 mg/dL (ref 6–23)
CALCIUM: 8.9 mg/dL (ref 8.4–10.5)
CHLORIDE: 100 meq/L (ref 96–112)
CO2: 23 mEq/L (ref 19–32)
CREATININE: 1.49 mg/dL — AB (ref 0.50–1.35)
GFR calc non Af Amer: 41 mL/min — ABNORMAL LOW (ref >90)
GFR, EST AFRICAN AMERICAN: 48 mL/min — AB (ref >90)
Glucose, Bld: 108 mg/dL — ABNORMAL HIGH (ref 70–99)
Potassium: 3.7 mEq/L (ref 3.7–5.3)
Sodium: 138 mEq/L (ref 137–147)

## 2013-05-06 LAB — HEPARIN LEVEL (UNFRACTIONATED): Heparin Unfractionated: 0.58 IU/mL (ref 0.30–0.70)

## 2013-05-06 MED ORDER — SODIUM CHLORIDE 0.9 % IV SOLN: INTRAVENOUS | Status: DC

## 2013-05-06 NOTE — Progress Notes (Signed)
Stroke Team Progress Note  HISTORY [name] is an 80 y.o. male with a history of heart failure, hypertension and abdominal aortic aneurysm was admitted on 04/28/2013 for management of atrial fibrillation and heart failure. Patient is currently on heparin drip for anticoagulation. At noon on 05/01/2013 his daughter noticed that he had difficulty feeding himself and she was unable to understand his speech. There is no previous history of stroke. NIH stroke score was 5. CT scan of his head showed no acute intracranial abnormality including no signs of acute infarction or acute stroke.  tPA was not considered as pt was anticoagulated on IV heparin.   SUBJECTIVE The patient's wife is at the bedside this morning.  OBJECTIVE Most recent Vital Signs: Filed Vitals:   05/05/13 1555 05/05/13 2126 05/06/13 0122 05/06/13 0500  BP: 115/62 121/57 127/71 124/69  Pulse: 77 70 80 78  Temp: 98.4 F (36.9 C) 97.8 F (36.6 C) 97.8 F (36.6 C) 98.4 F (36.9 C)  TempSrc:  Oral Oral Oral  Resp: 16 16 16 16   Height:      Weight: 150 lb 11.2 oz (68.357 kg)   150 lb 9.6 oz (68.312 kg)  SpO2: 100% 99% 92% 94%   CBG (last 3)   Recent Labs  05/04/13 2019  GLUCAP 91    IV Fluid Intake:   . heparin 900 Units/hr (05/06/13 16100639)    MEDICATIONS  . allopurinol  200 mg Oral Daily  . aspirin EC  81 mg Oral Daily  . atorvastatin  40 mg Oral q1800  . carvedilol  3.125 mg Oral BID WC  . clopidogrel  75 mg Oral Q breakfast  . colchicine  0.3 mg Oral Daily  . dicyclomine  20 mg Oral QID  . pantoprazole  40 mg Oral Daily   PRN:  acetaminophen, alum & mag hydroxide-simeth, guaiFENesin, influenza vac split quadrivalent PF, levalbuterol, nitroGLYCERIN, ondansetron (ZOFRAN) IV, zolpidem  Diet:  Dysphagia 2 thin liquids Activity:  OOB with assistance DVT Prophylaxis:  IV heparin  CLINICALLY SIGNIFICANT STUDIES Basic Metabolic Panel:   Recent Labs Lab 05/05/13 0547 05/06/13 0355  NA 137 138  K 3.9  3.7  CL 103 100  CO2 21 23  GLUCOSE 106* 108*  BUN 17 12  CREATININE 1.66* 1.49*  CALCIUM 8.7 8.9   Liver Function Tests:  No results found for this basename: AST, ALT, ALKPHOS, BILITOT, PROT, ALBUMIN,  in the last 168 hours CBC:   Recent Labs Lab 05/05/13 0547 05/06/13 0355  WBC 5.8 4.8  HGB 11.8* 11.6*  HCT 35.0* 34.0*  MCV 87.3 85.9  PLT 193 186   Coagulation:  No results found for this basename: LABPROT, INR,  in the last 168 hours Cardiac Enzymes:  No results found for this basename: CKTOTAL, CKMB, CKMBINDEX, TROPONINI,  in the last 168 hours Urinalysis: No results found for this basename: COLORURINE, APPERANCEUR, LABSPEC, PHURINE, GLUCOSEU, HGBUR, BILIRUBINUR, KETONESUR, PROTEINUR, UROBILINOGEN, NITRITE, LEUKOCYTESUR,  in the last 168 hours Lipid Panel    Component Value Date/Time   CHOL 206* 04/29/2013 0326   TRIG 114 04/29/2013 0326   HDL 39* 04/29/2013 0326   CHOLHDL 5.3 04/29/2013 0326   VLDL 23 04/29/2013 0326   LDLCALC 144* 04/29/2013 0326   HgbA1C  Lab Results  Component Value Date   HGBA1C 6.0* 05/03/2013    Urine Drug Screen:   No results found for this basename: labopia,  cocainscrnur,  labbenz,  amphetmu,  thcu,  labbarb    Alcohol Level:  No results found for this basename: ETH,  in the last 168 hours   CT of the brain  05/01/2013    1. No acute intracranial abnormalities. 2. Mild cerebral atrophy. 3. Chronic microvascular ischemic changes in the cerebral white matter.     MRI of the brain  05/02/2013 Several small acute nonhemorrhagic infarcts in different vascular distributions as detailed above raising the possibility of embolic disease.   MRA of the brain  05/02/2013 Intracranial atherosclerotic type changes   2D Echocardiogram  Diffuse hypokineisis worse in the inferior wall and apex The cavity size was mildly dilated. Wall thickness was normal. The estimated ejection fraction was 35%. Moderate MV regurgitation.  Carotid Doppler  RICA velocities  1-39% and the LICA velocities are 80-99%. Vertebral artery flow is patent and flow is antegrade, bilaterally.   CXR  04/28/2013 Increased interstitial markings bilaterally are worrisome for  interstitial edema of cardiac or noncardiac calls. One cannot exclude interstitial pneumonia in the appropriate clinical setting.  A followup PA and lateral chest x-ray would be of value when the patient can tolerate the procedure.  EKG  atrial fibrillation. For complete results please see formal report.   Therapy Recommendations HH PT, OT  Physical Exam   Frail elderly sian male not in distress.Awake alert. Afebrile. Head is nontraumatic. Neck is supple without bruit.  . Cardiac exam no murmur or gallop. Lungs are clear to auscultation. Distal pulses are well felt. Neurological Exam : Highly limited as patient and family members in the room but not speaking. His awake alert able to speak in his language fluently. Extraocular movements are full range without nystagmus. Face is symmetric. Tongue is midline. Mild left hemiparesis  but able to hold upper  and lower extremity up against gravity. Sensation and coordination cannot be reliably tested. Gait was not tested.  ASSESSMENT [name] is a 80 y.o. male initially admitted for atrial fibrillation and heart failure, who then developed new-onset slurred speech and right-sided weakness in the hospital. MRI confirmed bilateral, multiple vascular territory infarcts felt to be embolic secondary to known atrial fibrillation and/or L ICA stenosis > 80%. Carotid stent to be considered. On aspirin 81 mg orally every day and heparin at the time of consult. Now on aspirin 81 mg, IV heparin, and Plavix for secondary stroke prevention.   L ICA stenosis > 80% Hypertension atrial fibrillation NSTEMI - cath with diseast - medical management only Acute CHF, felt to be secondary to atrial fibrillation and diastolic dysfunction Chronic renal insufficiency, stage III  (moderate) PVD  Hyperlipidemia, LDL 144, on No statin PTA, now on lipitor 40 mg daily, goal LDL < 100   Hospital day # 8  TREATMENT/PLAN  Continue IV heparin, aspirin 81 mg, and Plavix for secondary stroke prevention for now. Plavix added as discussed with Dr. Excell Seltzer. No plan for oral anticoagulation at this time due to risk of bleeding on dual antiplatelets planned at discharge for carotid stenting.   Dr Pearlean Brownie and Dr. Excell Seltzer discussed medical treatment and plan of care  Dr. Allyson Sabal has seen patient for possible carotid stenting. This option was discussed with the patient's family. They have agreed to move forward and the procedure has been scheduled for Monday.  Home health PT and OT  .   Delton See PA-C [facility] Pager [phone] 05/06/2013, 7:56 AM  I have personally obtained a history, examined the patient, evaluated imaging results, and formulated the assessment and plan of care. I agree with the  above.  Delia Heady, MD To contact Stroke Continuity provider, please refer to [email or web address]. After hours, contact General Neurology

## 2013-05-06 NOTE — Progress Notes (Signed)
    Subjective:  No chest pain or dyspnea. Translator in room this am. Family and patient have decided they want to proceed with carotid stenting.  Objective:  Vital Signs in the last 24 hours: Temp:  [97.8 F (36.6 C)-98.4 F (36.9 C)] 98.4 F (36.9 C) (03/27 0500) Pulse Rate:  [70-80] 78 (03/27 0500) Resp:  [16] 16 (03/27 0500) BP: (115-127)/(54-71) 124/69 mmHg (03/27 0500) SpO2:  [92 %-100 %] 94 % (03/27 0500) Weight:  [68.312 kg (150 lb 9.6 oz)-68.357 kg (150 lb 11.2 oz)] 68.312 kg (150 lb 9.6 oz) (03/27 0500)  Intake/Output from previous day: 03/26 0701 - 03/27 0700 In: -  Out: 200 [Urine:200]  Physical Exam: Pt is alert, elderly gentleman in NAD HEENT: normal Neck: JVP - normal Lungs: CTA bilaterally CV: RRR without murmur or gallop Abd: soft, NT, Positive BS, no hepatomegaly Ext: no C/C/E, distal pulses intact and equal Skin: warm/dry no rash   Lab Results:  Recent Labs  05/05/13 0547 05/06/13 0355  WBC 5.8 4.8  HGB 11.8* 11.6*  PLT 193 186    Recent Labs  05/05/13 0547 05/06/13 0355  NA 137 138  K 3.9 3.7  CL 103 100  CO2 21 23  GLUCOSE 106* 108*  BUN 17 12  CREATININE 1.66* 1.49*   No results found for this basename: TROPONINI, CK, MB,  in the last 72 hours  Tele: Sinus rhythm, personally reviewed  Assessment/Plan:  1. PAF - maintaining sinus rhythm 2. NSTEMI - cath findings noted. Medical therapy recommended. Pt on ASA, plavix, beta-blocker, statin. 3. Acute systolic heart failure. BP will not currently allow for med titration. I wrote for hydralazine yesterday but not given secondary to low BP. Need to be cautious about dropping BP in setting severe carotid stenosis. Continue low-dose carvedilol.  4. Severe symptomatic LICA stenosis - family and patient discussed carotid stenting with Dr Allyson Sabal yesterday and have decided they would like to move forward with this. Through translator, I have discussed risks and rationale for carotid stenting.  This has also been recommended by neurology. They understand and agree to proceed.  5. Dyslipidemia - on atorvastatin  Dispo: keep on tele over weekend. Plans for carotid stenting Monday. Pt on ASA, plavix, heparin.   Tonny Bollman, M.D. 05/06/2013, 9:30 AM

## 2013-05-06 NOTE — Progress Notes (Signed)
ANTICOAGULATION CONSULT NOTE - Follow Up Consult  Pharmacy Consult for Heparin Indication: severe ICA stenosis  Allergies  Allergen Reactions  . Phenazopyridine Hcl Other (See Comments)    unknown  . Septra [Sulfamethoxazole-Tmp Ds] Other (See Comments)    unknown   Patient Measurements: Height: 5\' 2"  (157.5 cm) Weight: 150 lb 9.6 oz (68.312 kg) IBW/kg (Calculated) : 54.6 Heparin Dosing Weight: 65.9 kg  Vital Signs: Temp: 98.4 F (36.9 C) (03/27 0500) Temp src: Oral (03/27 0500) BP: 124/69 mmHg (03/27 0500) Pulse Rate: 78 (03/27 0500)  Labs:  Recent Labs  05/04/13 0317 05/05/13 0547 05/05/13 1149 05/05/13 2056 05/06/13 0355  HGB 12.5* 11.8*  --   --  11.6*  HCT 36.9* 35.0*  --   --  34.0*  PLT 202 193  --   --  186  HEPARINUNFRC 0.29*  --  0.47 0.71* 0.58  CREATININE 1.72* 1.66*  --   --  1.49*    Estimated Creatinine Clearance: 31.4 ml/min (by C-G formula based on Cr of 1.49).  Medications:  . heparin 900 Units/hr (05/06/13 1117)    Assessment: 80 YO M on a heparin drip s/p cath for multivessel disease and Afib.  Also noted to have severe ICA stenosis with plans for stenting Monday 3/29.  Heparin level is therapeutic on 900 units/hr.  No bleeding noted.  Goal of Therapy:  Heparin level 0.3-0.7 units/ml Monitor platelets by anticoagulation protocol: Yes   Plan:  - Continue heparin at 900 units/hr. - Daily heparin level and CBC  Petr Bontempo, Pharm.D., BCPS Clinical Pharmacist Pager [phone] 05/06/2013 9:54 AM

## 2013-05-07 LAB — BASIC METABOLIC PANEL
BUN: 9 mg/dL (ref 6–23)
CHLORIDE: 99 meq/L (ref 96–112)
CO2: 20 meq/L (ref 19–32)
CREATININE: 1.49 mg/dL — AB (ref 0.50–1.35)
Calcium: 9 mg/dL (ref 8.4–10.5)
GFR calc non Af Amer: 41 mL/min — ABNORMAL LOW (ref >90)
GFR, EST AFRICAN AMERICAN: 48 mL/min — AB (ref >90)
Glucose, Bld: 120 mg/dL — ABNORMAL HIGH (ref 70–99)
POTASSIUM: 3.3 meq/L — AB (ref 3.7–5.3)
SODIUM: 135 meq/L — AB (ref 137–147)

## 2013-05-07 LAB — CBC
HCT: 35.6 % — ABNORMAL LOW (ref 39.0–52.0)
Hemoglobin: 12.5 g/dL — ABNORMAL LOW (ref 13.0–17.0)
MCH: 29.6 pg (ref 26.0–34.0)
MCHC: 35.1 g/dL (ref 30.0–36.0)
MCV: 84.2 fL (ref 78.0–100.0)
Platelets: 191 10*3/uL (ref 150–400)
RBC: 4.23 MIL/uL (ref 4.22–5.81)
RDW: 14.1 % (ref 11.5–15.5)
WBC: 6.2 10*3/uL (ref 4.0–10.5)

## 2013-05-07 LAB — HEPARIN LEVEL (UNFRACTIONATED)
HEPARIN UNFRACTIONATED: 0.56 [IU]/mL (ref 0.30–0.70)
HEPARIN UNFRACTIONATED: 0.4 [IU]/mL (ref 0.30–0.70)

## 2013-05-07 MED ORDER — STROKE: EARLY STAGES OF RECOVERY BOOK
Freq: Once | Status: AC
  Administered 2013-05-07: 09:00:00
  Filled 2013-05-07: qty 1

## 2013-05-07 NOTE — Progress Notes (Addendum)
ANTICOAGULATION CONSULT NOTE - Follow Up Consult  Pharmacy Consult for Heparin Indication: severe ICA stenosis, stroke  Allergies  Allergen Reactions  . Phenazopyridine Hcl Other (See Comments)    unknown  . Septra [Sulfamethoxazole-Tmp Ds] Other (See Comments)    unknown   Patient Measurements: Height: 5\' 2"  (157.5 cm) Weight: 152 lb 4.8 oz (69.083 kg) IBW/kg (Calculated) : 54.6 Heparin Dosing Weight: 65.9 kg  Vital Signs: Temp: 98.3 F (36.8 C) (03/28 0741) Temp src: Oral (03/28 0741) BP: 101/55 mmHg (03/28 0741) Pulse Rate: 90 (03/28 0741)  Labs:  Recent Labs  05/05/13 0547  05/05/13 2056 05/06/13 0355 05/07/13 0548  HGB 11.8*  --   --  11.6* 12.5*  HCT 35.0*  --   --  34.0* 35.6*  PLT 193  --   --  186 191  HEPARINUNFRC  --   < > 0.71* 0.58 0.56  CREATININE 1.66*  --   --  1.49* 1.49*  < > = values in this interval not displayed.  Estimated Creatinine Clearance: 31.5 ml/min (by C-G formula based on Cr of 1.49).  Medications:  . [START ON 05/09/2013] sodium chloride    . heparin 900 Units/hr (05/06/13 6389)    Assessment: 80 YO M on a heparin drip s/p cath for multivessel disease, recent stroke thought to be cardioembolic secondary to Afib, and severe L ICA stenosis.  Plan is for ICA stenting on Monday 3/29.  Adjusted heparin level goal with recent stroke. Heparin level is just above therapeutic range at 0.56 on 900 units/hr. No bleeding noted, CBC stable.  Goal of Therapy:  Heparin level 0.3-0.5 units/ml Monitor platelets by anticoagulation protocol: Yes   Plan:  - Decrease heparin drip to 800 units/hr - 8 hr heparin level - Daily heparin level and CBC - Monitor for s/sx of bleeding  [place], [place].D., BCPS Clinical Pharmacist Pager: [phone] 05/07/2013 8:36 AM  Addendum:  Heparin level (0.4) is therapeutic now on 800 units/hr. No bleeding noted per chart.  Plan: continue heparin at current rate, will f/u AM labs  Bayard Hugger, PharmD, BCPS   Clinical Pharmacist  Pager: [phone]

## 2013-05-07 NOTE — Progress Notes (Signed)
Stroke Team Progress Note  HISTORY [name] is an 80 y.o. male with a history of heart failure, hypertension and abdominal aortic aneurysm was admitted on 04/28/2013 for management of atrial fibrillation and heart failure. Patient is currently on heparin drip for anticoagulation. At noon on 05/01/2013 his daughter noticed that he had difficulty feeding himself and she was unable to understand his speech. There is no previous history of stroke. NIH stroke score was 5. CT scan of his head showed no acute intracranial abnormality including no signs of acute infarction or acute stroke.  tPA was not considered as pt was anticoagulated on IV heparin.   SUBJECTIVE Resting comfortably, no acute overnight events.   OBJECTIVE Most recent Vital Signs: Filed Vitals:   05/07/13 0400 05/07/13 0741 05/07/13 0838 05/07/13 1150  BP: 105/57 101/55 113/75 102/70  Pulse: 75 90 75 76  Temp: 98.1 F (36.7 C) 98.3 F (36.8 C)  98.4 F (36.9 C)  TempSrc: Oral Oral  Oral  Resp: 18 17  16   Height:      Weight: 152 lb 4.8 oz (69.083 kg)     SpO2: 99% 98%  97%   CBG (last 3)   Recent Labs  05/04/13 2019  GLUCAP 91    IV Fluid Intake:   . [START ON 05/09/2013] sodium chloride    . heparin 800 Units/hr (05/07/13 1308)    MEDICATIONS  . allopurinol  200 mg Oral Daily  . aspirin EC  81 mg Oral Daily  . atorvastatin  40 mg Oral q1800  . carvedilol  3.125 mg Oral BID WC  . clopidogrel  75 mg Oral Q breakfast  . colchicine  0.3 mg Oral Daily  . dicyclomine  20 mg Oral QID  . pantoprazole  40 mg Oral Daily   PRN:  acetaminophen, alum & mag hydroxide-simeth, guaiFENesin, influenza vac split quadrivalent PF, levalbuterol, nitroGLYCERIN, ondansetron (ZOFRAN) IV, zolpidem  Diet:  Dysphagia 2 thin liquids Activity:  OOB with assistance DVT Prophylaxis:  IV heparin  CLINICALLY SIGNIFICANT STUDIES Basic Metabolic Panel:   Recent Labs Lab 05/06/13 0355 05/07/13 0548  NA 138 135*  K 3.7 3.3*   CL 100 99  CO2 23 20  GLUCOSE 108* 120*  BUN 12 9  CREATININE 1.49* 1.49*  CALCIUM 8.9 9.0   Liver Function Tests:  No results found for this basename: AST, ALT, ALKPHOS, BILITOT, PROT, ALBUMIN,  in the last 168 hours CBC:   Recent Labs Lab 05/06/13 0355 05/07/13 0548  WBC 4.8 6.2  HGB 11.6* 12.5*  HCT 34.0* 35.6*  MCV 85.9 84.2  PLT 186 191   Coagulation:  No results found for this basename: LABPROT, INR,  in the last 168 hours Cardiac Enzymes:  No results found for this basename: CKTOTAL, CKMB, CKMBINDEX, TROPONINI,  in the last 168 hours Urinalysis: No results found for this basename: COLORURINE, APPERANCEUR, LABSPEC, PHURINE, GLUCOSEU, HGBUR, BILIRUBINUR, KETONESUR, PROTEINUR, UROBILINOGEN, NITRITE, LEUKOCYTESUR,  in the last 168 hours Lipid Panel    Component Value Date/Time   CHOL 206* 04/29/2013 0326   TRIG 114 04/29/2013 0326   HDL 39* 04/29/2013 0326   CHOLHDL 5.3 04/29/2013 0326   VLDL 23 04/29/2013 0326   LDLCALC 144* 04/29/2013 0326   HgbA1C  Lab Results  Component Value Date   HGBA1C 6.0* 05/03/2013    Urine Drug Screen:   No results found for this basename: labopia,  cocainscrnur,  labbenz,  amphetmu,  thcu,  labbarb    Alcohol Level: No  results found for this basename: ETH,  in the last 168 hours   CT of the brain  05/01/2013    1. No acute intracranial abnormalities. 2. Mild cerebral atrophy. 3. Chronic microvascular ischemic changes in the cerebral white matter.     MRI of the brain  05/02/2013 Several small acute nonhemorrhagic infarcts in different vascular distributions as detailed above raising the possibility of embolic disease.   MRA of the brain  05/02/2013 Intracranial atherosclerotic type changes   2D Echocardiogram  Diffuse hypokineisis worse in the inferior wall and apex The cavity size was mildly dilated. Wall thickness was normal. The estimated ejection fraction was 35%. Moderate MV regurgitation.  Carotid Doppler  RICA velocities 1-39%  and the LICA velocities are 80-99%. Vertebral artery flow is patent and flow is antegrade, bilaterally.   CXR  04/28/2013 Increased interstitial markings bilaterally are worrisome for  interstitial edema of cardiac or noncardiac calls. One cannot exclude interstitial pneumonia in the appropriate clinical setting.  A followup PA and lateral chest x-ray would be of value when the patient can tolerate the procedure.  EKG  atrial fibrillation. For complete results please see formal report.   Therapy Recommendations HH PT, OT  Physical Exam   Frail elderly sian male not in distress.Awake alert. Afebrile. Head is nontraumatic. Neck is supple without bruit.  . Cardiac exam no murmur or gallop. Lungs are clear to auscultation. Distal pulses are well felt. Neurological Exam : Highly limited as patient and family members in the room but not speaking. His awake alert able to speak in his language fluently. Extraocular movements are full range without nystagmus. Face is symmetric. Tongue is midline. Mild left hemiparesis  but able to hold upper  and lower extremity up against gravity. Sensation and coordination cannot be reliably tested. Gait was not tested.  ASSESSMENT [name] is a 80 y.o. male initially admitted for atrial fibrillation and heart failure, who then developed new-onset slurred speech and right-sided weakness in the hospital. MRI confirmed bilateral, multiple vascular territory infarcts felt to be embolic secondary to known atrial fibrillation and/or L ICA stenosis > 80%. Carotid stent to be considered. On aspirin 81 mg orally every day and heparin at the time of consult. Now on aspirin 81 mg, IV heparin, and Plavix for secondary stroke prevention.   L ICA stenosis > 80% Hypertension atrial fibrillation NSTEMI - cath with diseast - medical management only Acute CHF, felt to be secondary to atrial fibrillation and diastolic dysfunction Chronic renal insufficiency, stage III  (moderate) PVD  Hyperlipidemia, LDL 144, on No statin PTA, now on lipitor 40 mg daily, goal LDL < 100   Hospital day # 9  TREATMENT/PLAN  Continue IV heparin, aspirin 81 mg, and Plavix for secondary stroke prevention for now. Plavix added as discussed with Dr. Excell Seltzerooper. No plan for oral anticoagulation at this time due to risk of bleeding on dual antiplatelets planned at discharge for carotid stenting.   Dr. Allyson SabalBerry has seen patient for possible carotid stenting. This option was discussed with the patient's family. They have agreed to move forward and the procedure has been scheduled for Monday.  Home health PT and OT   Elspeth ChoPeter Tradarius Reinwald, D.O.  Neurology-Stroke   To contact Stroke Continuity provider, please refer to [email or web address]. After hours, contact General Neurology

## 2013-05-07 NOTE — Progress Notes (Addendum)
Patient ID: [name], male   DOB: 02/14/1926, 80 y.o.   MRN: [id number]    SUBJECTIVE: There are multiple family members in the room. I have spoken with a family member who does speak [place]. The patient was stable during the night. He is scheduled for carotid stent on Monday  Filed Vitals:   05/07/13 0000 05/07/13 0400 05/07/13 0741 05/07/13 0838  BP: 125/66 105/57 101/55 113/75  Pulse: 71 75 90 75  Temp: 97.9 F (36.6 C) 98.1 F (36.7 C) 98.3 F (36.8 C)   TempSrc: Oral Oral Oral   Resp: 18 18 17    Height:      Weight:  152 lb 4.8 oz (69.083 kg)    SpO2: 95% 99% 98%     No intake or output data in the 24 hours ending 05/07/13 1025  LABS: Basic Metabolic Panel:  Recent Labs  40/98/1103/27/15 0355 05/07/13 0548  NA 138 135*  K 3.7 3.3*  CL 100 99  CO2 23 20  GLUCOSE 108* 120*  BUN 12 9  CREATININE 1.49* 1.49*  CALCIUM 8.9 9.0   Liver Function Tests: No results found for this basename: AST, ALT, ALKPHOS, BILITOT, PROT, ALBUMIN,  in the last 72 hours No results found for this basename: LIPASE, AMYLASE,  in the last 72 hours CBC:  Recent Labs  05/06/13 0355 05/07/13 0548  WBC 4.8 6.2  HGB 11.6* 12.5*  HCT 34.0* 35.6*  MCV 85.9 84.2  PLT 186 191   Cardiac Enzymes: No results found for this basename: CKTOTAL, CKMB, CKMBINDEX, TROPONINI,  in the last 72 hours BNP: No components found with this basename: POCBNP,  D-Dimer: No results found for this basename: DDIMER,  in the last 72 hours Hemoglobin A1C: No results found for this basename: HGBA1C,  in the last 72 hours Fasting Lipid Panel: No results found for this basename: CHOL, HDL, LDLCALC, TRIG, CHOLHDL, LDLDIRECT,  in the last 72 hours Thyroid Function Tests: No results found for this basename: TSH, T4TOTAL, FREET3, T3FREE, THYROIDAB,  in the last 72 hours  RADIOLOGY: Ct Head Wo Contrast  05/01/2013   CLINICAL DATA:  Of evaluate for potential left sided cerebral hemorrhage or stroke. Atrial  fibrillation.  EXAM: CT HEAD WITHOUT CONTRAST  TECHNIQUE: Contiguous axial images were obtained from the base of the skull through the vertex without intravenous contrast.  COMPARISON:  Head CT 11/08/2012.  FINDINGS: Mild cerebral atrophy. Patchy and confluent areas of decreased attenuation are noted throughout the deep and periventricular white matter of the cerebral hemispheres bilaterally, compatible with chronic microvascular ischemic disease. No acute intracranial abnormalities. Specifically, no evidence of acute intracranial hemorrhage, no definite findings of acute/subacute cerebral ischemia, no mass, mass effect, hydrocephalus or abnormal intra or extra-axial fluid collections. Visualized paranasal sinuses and mastoids are well pneumatized. No acute displaced skull fractures are identified.  IMPRESSION: 1. No acute intracranial abnormalities. 2. Mild cerebral atrophy. 3. Chronic microvascular ischemic changes in the cerebral white matter.   Electronically Signed   By: Trudie Reedaniel  Entrikin M.D.   On: 05/01/2013 21:20   Mr Maxine GlennMra Head Wo Contrast  05/03/2013   CLINICAL DATA:  Difficulty feeding himself which began 05/01/2013. Speech difficulty. Hypertension.  EXAM: MRI HEAD WITHOUT CONTRAST  MRA HEAD WITHOUT CONTRAST  TECHNIQUE: Multiplanar, multiecho pulse sequences of the brain and surrounding structures were obtained without intravenous contrast. Angiographic images of the head were obtained using MRA technique without contrast.  COMPARISON:  05/01/2013 head CT.  No comparison MR.  FINDINGS: MRI  HEAD FINDINGS  Exam is motion degraded.  Scattered small acute nonhemorrhagic infarcts including:  Left frontal -parietal region in a slightly parasagittal distribution.  Superior left peri operculum region.  Posterior limb left internal capsule.  Posterior left temporal-occipital lobe.  Right parietal lobe.  Possibly right cerebellum.  The fact that multiple vascular distributions are involved raises the possibility of  embolic disease. Watershed type infarct as a cause of the parasagittal left convexity infarcts is a possibility but would not explain surrounding small infarcts.  Remote small infarcts left parietal and right frontal lobe. Remote left thalamic infarcts.  Mild to moderate small vessel disease type changes.  No intracranial hemorrhage.  Atrophy without hydrocephalus.  No intracranial mass lesion noted on this unenhanced exam.  Partially empty sella incidentally noted. Cervical medullary junction, pituitary region and orbital structures unremarkable.  Minimal paranasal sinus mucosal thickening.  MRA HEAD FINDINGS  Moderate to marked narrowing M1 segment right middle cerebral artery extending into the right middle cerebral artery bifurcation. Decrease number of visualized right middle cerebral artery branches.  Mild to moderate narrowing A1 -A2 aspect of the left anterior cerebral artery.  Left middle cerebral artery mild branch vessel irregularity.  Left vertebral artery is dominant. Areas of mild to moderate narrowing involving portions of the right vertebral artery.  Poor delineation of majority of the right posterior inferior cerebellar artery. Non visualization right posterior inferior cerebellar artery.  Mild to moderate narrowing proximal to mid basilar artery.  Poor delineation anterior inferior cerebellar artery.  Moderate narrowing superior cerebellar artery bilaterally.  Moderate tandem stenosis right posterior cerebral artery. Mild narrowing and irregularity left posterior cerebral artery.  No aneurysm detected.  IMPRESSION: Several small acute nonhemorrhagic infarcts in different vascular distributions as detailed above raising the possibility of embolic disease.  Intracranial atherosclerotic type changes as detailed above.  These results will be called to the ordering clinician or representative by the Radiologist Assistant, and communication documented in the PACS Dashboard.   Electronically Signed   By:  Bridgett Larsson M.D.   On: 05/03/2013 16:17   Mr Brain Wo Contrast  05/03/2013   CLINICAL DATA:  Difficulty feeding himself which began 05/01/2013. Speech difficulty. Hypertension.  EXAM: MRI HEAD WITHOUT CONTRAST  MRA HEAD WITHOUT CONTRAST  TECHNIQUE: Multiplanar, multiecho pulse sequences of the brain and surrounding structures were obtained without intravenous contrast. Angiographic images of the head were obtained using MRA technique without contrast.  COMPARISON:  05/01/2013 head CT.  No comparison MR.  FINDINGS: MRI HEAD FINDINGS  Exam is motion degraded.  Scattered small acute nonhemorrhagic infarcts including:  Left frontal -parietal region in a slightly parasagittal distribution.  Superior left peri operculum region.  Posterior limb left internal capsule.  Posterior left temporal-occipital lobe.  Right parietal lobe.  Possibly right cerebellum.  The fact that multiple vascular distributions are involved raises the possibility of embolic disease. Watershed type infarct as a cause of the parasagittal left convexity infarcts is a possibility but would not explain surrounding small infarcts.  Remote small infarcts left parietal and right frontal lobe. Remote left thalamic infarcts.  Mild to moderate small vessel disease type changes.  No intracranial hemorrhage.  Atrophy without hydrocephalus.  No intracranial mass lesion noted on this unenhanced exam.  Partially empty sella incidentally noted. Cervical medullary junction, pituitary region and orbital structures unremarkable.  Minimal paranasal sinus mucosal thickening.  MRA HEAD FINDINGS  Moderate to marked narrowing M1 segment right middle cerebral artery extending into the right middle cerebral artery bifurcation.  Decrease number of visualized right middle cerebral artery branches.  Mild to moderate narrowing A1 -A2 aspect of the left anterior cerebral artery.  Left middle cerebral artery mild branch vessel irregularity.  Left vertebral artery is dominant.  Areas of mild to moderate narrowing involving portions of the right vertebral artery.  Poor delineation of majority of the right posterior inferior cerebellar artery. Non visualization right posterior inferior cerebellar artery.  Mild to moderate narrowing proximal to mid basilar artery.  Poor delineation anterior inferior cerebellar artery.  Moderate narrowing superior cerebellar artery bilaterally.  Moderate tandem stenosis right posterior cerebral artery. Mild narrowing and irregularity left posterior cerebral artery.  No aneurysm detected.  IMPRESSION: Several small acute nonhemorrhagic infarcts in different vascular distributions as detailed above raising the possibility of embolic disease.  Intracranial atherosclerotic type changes as detailed above.  These results will be called to the ordering clinician or representative by the Radiologist Assistant, and communication documented in the PACS Dashboard.   Electronically Signed   By: Bridgett Larsson M.D.   On: 05/03/2013 16:17   Dg Chest Portable 1 View  04/28/2013   CLINICAL DATA:  Shortness of breath.  EXAM: PORTABLE CHEST - 1 VIEW  COMPARISON:  DG CHEST 2 VIEW dated 11/08/2012  FINDINGS: The lungs are adequately inflated. The interstitial markings are increased diffusely. The cardiopericardial silhouette is enlarged. Its borders are somewhat indistinct. The central pulmonary vascularity is prominent but definite cephalization is not demonstrated. There is no pleural effusion or pneumothorax. The mediastinum is normal in width. There is calcification in the wall of the aortic arch. The observed portions of the bony structures exhibit no acute abnormalities.  IMPRESSION: Increased interstitial markings bilaterally are worrisome for interstitial edema of cardiac or noncardiac calls. One cannot exclude interstitial pneumonia in the appropriate clinical setting. A followup PA and lateral chest x-ray would be of value when the patient can tolerate the procedure.    Electronically Signed   By: David  [place]   On: 04/28/2013 11:39   Dg Chest Port 1v Same Day  05/04/2013   CLINICAL DATA:  Increased wheezing.  Shortness of breath.  EXAM: PORTABLE CHEST - 1 VIEW SAME DAY  COMPARISON:  Chest x-ray 04/28/2013.  FINDINGS: Previously noted min discretion previously noted mild interstitial pulmonary edema has resolved. No consolidative airspace disease. No pleural effusions. Heart size is within normal limits. The patient is rotated to the left on today's exam, resulting in distortion of the mediastinal contours and reduced diagnostic sensitivity and specificity for mediastinal pathology. Atherosclerosis in the thoracic aorta.  IMPRESSION: 1. Resolution of previously noted mild congestive heart failure. No new acute findings. 2. Atherosclerosis.   Electronically Signed   By: Trudie Reed M.D.   On: 05/04/2013 21:00   Physical exam.   The patient is resting now. He slept only intermittently overnight. He is lying flat in bed. Lungs reveal scattered rhonchi. Cardiac exam reveals S1 and S2. Abdomen is soft.  TELEMETRY:  I have reviewed telemetry today. May 07, 2013, currently the rhythm is atrial fib. The rate is controlled.   ASSESSMENT AND PLAN:    Acute CHF- presume secondary to AF and diastolic dysfunction. (Nl LVF 2010)    Volume status is stable.     CVA (cerebral infarction)     The plan is for carotid stenting on Monday.    Willa Rough 05/07/2013 10:25 AM

## 2013-05-08 LAB — BASIC METABOLIC PANEL
BUN: 9 mg/dL (ref 6–23)
CO2: 21 meq/L (ref 19–32)
Calcium: 9.1 mg/dL (ref 8.4–10.5)
Chloride: 102 mEq/L (ref 96–112)
Creatinine, Ser: 1.48 mg/dL — ABNORMAL HIGH (ref 0.50–1.35)
GFR calc Af Amer: 48 mL/min — ABNORMAL LOW (ref >90)
GFR, EST NON AFRICAN AMERICAN: 42 mL/min — AB (ref >90)
Glucose, Bld: 94 mg/dL (ref 70–99)
POTASSIUM: 3.6 meq/L — AB (ref 3.7–5.3)
SODIUM: 138 meq/L (ref 137–147)

## 2013-05-08 LAB — CBC
HCT: 37 % — ABNORMAL LOW (ref 39.0–52.0)
HEMOGLOBIN: 12.9 g/dL — AB (ref 13.0–17.0)
MCH: 29.5 pg (ref 26.0–34.0)
MCHC: 34.9 g/dL (ref 30.0–36.0)
MCV: 84.5 fL (ref 78.0–100.0)
PLATELETS: 208 10*3/uL (ref 150–400)
RBC: 4.38 MIL/uL (ref 4.22–5.81)
RDW: 14.2 % (ref 11.5–15.5)
WBC: 7.1 10*3/uL (ref 4.0–10.5)

## 2013-05-08 LAB — HEPARIN LEVEL (UNFRACTIONATED)
HEPARIN UNFRACTIONATED: 0.43 [IU]/mL (ref 0.30–0.70)
Heparin Unfractionated: 0.27 IU/mL — ABNORMAL LOW (ref 0.30–0.70)

## 2013-05-08 MED ORDER — ASPIRIN 81 MG PO CHEW
81.0000 mg | CHEWABLE_TABLET | ORAL | Status: AC
  Administered 2013-05-09: 81 mg via ORAL
  Filled 2013-05-08: qty 1

## 2013-05-08 MED ORDER — SODIUM CHLORIDE 0.9 % IJ SOLN
3.0000 mL | Freq: Two times a day (BID) | INTRAMUSCULAR | Status: DC
  Administered 2013-05-08: 3 mL via INTRAVENOUS

## 2013-05-08 MED ORDER — SODIUM CHLORIDE 0.9 % IJ SOLN: 3.0000 mL | INTRAMUSCULAR | Status: DC | PRN

## 2013-05-08 MED ORDER — SODIUM CHLORIDE 0.9 % IV SOLN: 250.0000 mL | INTRAVENOUS | Status: DC | PRN

## 2013-05-08 NOTE — Progress Notes (Signed)
Pt in no acute distress, lungs with expiratory wheezes throughout.  RR 22, pt denies any c/o oxygen sat 100% on room air, non productive congested cough noted.  Xopenex nebulizer given per prn orders.  O2 sat 100% lungs diminished with minimal wheezes noted.  Will continue to monitor.

## 2013-05-08 NOTE — Progress Notes (Signed)
ANTICOAGULATION CONSULT NOTE - Follow Up Consult  Pharmacy Consult for Heparin Indication: severe ICA stenosis, stroke  Allergies  Allergen Reactions  . Phenazopyridine Hcl Other (See Comments)    unknown  . Septra [Sulfamethoxazole-Tmp Ds] Other (See Comments)    unknown   Patient Measurements: Height: 5\' 2"  (157.5 cm) Weight: 145 lb 1.6 oz (65.817 kg) IBW/kg (Calculated) : 54.6 Heparin Dosing Weight: 65.9 kg  Vital Signs: Temp: 97.9 F (36.6 C) (03/29 0822) Temp src: Oral (03/29 0822) BP: 102/60 mmHg (03/29 0928) Pulse Rate: 75 (03/29 0822)  Labs:  Recent Labs  05/06/13 0355 05/07/13 0548 05/07/13 1803 05/08/13 0440 05/08/13 1343  HGB 11.6* 12.5*  --  12.9*  --   HCT 34.0* 35.6*  --  37.0*  --   PLT 186 191  --  208  --   HEPARINUNFRC 0.58 0.56 0.40 0.27* 0.43  CREATININE 1.49* 1.49*  --  1.48*  --     Estimated Creatinine Clearance: 31.1 ml/min (by C-G formula based on Cr of 1.48).  Medications:  . [START ON 05/09/2013] sodium chloride    . heparin 850 Units/hr (05/08/13 0545)    Assessment: 80 YO M on a heparin drip s/p cath for multivessel disease, recent stroke thought to be cardioembolic secondary to Afib, and severe L ICA stenosis.  Plan is for ICA stenting on Monday 3/30.  Adjusted heparin level goal with recent stroke. Heparin level is therapeutic at 0.43.  No bleeding noted, CBC stable.  Goal of Therapy:  Heparin level 0.3-0.5 units/ml Monitor platelets by anticoagulation protocol: Yes   Plan:  - Continue heparin drip at 850 units/hr - Daily heparin level and CBC - Monitor for s/sx of bleeding  [facility], [place].D., BCPS Clinical Pharmacist Pager: [phone] 05/08/2013 2:36 PM

## 2013-05-08 NOTE — Progress Notes (Signed)
Patient ID: [name], male   DOB: December 07, 1926, 80 y.o.   MRN: [id number]    SUBJECTIVE:  The patient is resting comfortably. He does not speaking much. I have spoken with his son in the room. His son is aware of the planned carotid stent procedure for tomorrow. The patient is stable.   Filed Vitals:   05/07/13 1724 05/07/13 2100 05/07/13 2357 05/08/13 0400  BP: 113/67 98/57 107/64 99/55  Pulse: 72 66 78 83  Temp:  98.1 F (36.7 C) 98.2 F (36.8 C) 99.6 F (37.6 C)  TempSrc:      Resp:  16 16 16   Height:      Weight:    145 lb 1.6 oz (65.817 kg)  SpO2:  98% 94% 97%     Intake/Output Summary (Last 24 hours) at 05/08/13 0855 Last data filed at 05/08/13 0300  Gross per 24 hour  Intake    292 ml  Output    325 ml  Net    -33 ml    LABS: Basic Metabolic Panel:  Recent Labs  40/98/11 0548 05/08/13 0440  NA 135* 138  K 3.3* 3.6*  CL 99 102  CO2 20 21  GLUCOSE 120* 94  BUN 9 9  CREATININE 1.49* 1.48*  CALCIUM 9.0 9.1   Liver Function Tests: No results found for this basename: AST, ALT, ALKPHOS, BILITOT, PROT, ALBUMIN,  in the last 72 hours No results found for this basename: LIPASE, AMYLASE,  in the last 72 hours CBC:  Recent Labs  05/07/13 0548 05/08/13 0440  WBC 6.2 7.1  HGB 12.5* 12.9*  HCT 35.6* 37.0*  MCV 84.2 84.5  PLT 191 208   Cardiac Enzymes: No results found for this basename: CKTOTAL, CKMB, CKMBINDEX, TROPONINI,  in the last 72 hours BNP: No components found with this basename: POCBNP,  D-Dimer: No results found for this basename: DDIMER,  in the last 72 hours Hemoglobin A1C: No results found for this basename: HGBA1C,  in the last 72 hours Fasting Lipid Panel: No results found for this basename: CHOL, HDL, LDLCALC, TRIG, CHOLHDL, LDLDIRECT,  in the last 72 hours Thyroid Function Tests: No results found for this basename: TSH, T4TOTAL, FREET3, T3FREE, THYROIDAB,  in the last 72 hours  RADIOLOGY: Ct Head Wo Contrast  05/01/2013    CLINICAL DATA:  Of evaluate for potential left sided cerebral hemorrhage or stroke. Atrial fibrillation.  EXAM: CT HEAD WITHOUT CONTRAST  TECHNIQUE: Contiguous axial images were obtained from the base of the skull through the vertex without intravenous contrast.  COMPARISON:  Head CT 11/08/2012.  FINDINGS: Mild cerebral atrophy. Patchy and confluent areas of decreased attenuation are noted throughout the deep and periventricular white matter of the cerebral hemispheres bilaterally, compatible with chronic microvascular ischemic disease. No acute intracranial abnormalities. Specifically, no evidence of acute intracranial hemorrhage, no definite findings of acute/subacute cerebral ischemia, no mass, mass effect, hydrocephalus or abnormal intra or extra-axial fluid collections. Visualized paranasal sinuses and mastoids are well pneumatized. No acute displaced skull fractures are identified.  IMPRESSION: 1. No acute intracranial abnormalities. 2. Mild cerebral atrophy. 3. Chronic microvascular ischemic changes in the cerebral white matter.   Electronically Signed   By: Trudie Reed M.D.   On: 05/01/2013 21:20   Mr Maxine Glenn Head Wo Contrast  05/03/2013   CLINICAL DATA:  Difficulty feeding himself which began 05/01/2013. Speech difficulty. Hypertension.  EXAM: MRI HEAD WITHOUT CONTRAST  MRA HEAD WITHOUT CONTRAST  TECHNIQUE: Multiplanar, multiecho pulse sequences of the  brain and surrounding structures were obtained without intravenous contrast. Angiographic images of the head were obtained using MRA technique without contrast.  COMPARISON:  05/01/2013 head CT.  No comparison MR.  FINDINGS: MRI HEAD FINDINGS  Exam is motion degraded.  Scattered small acute nonhemorrhagic infarcts including:  Left frontal -parietal region in a slightly parasagittal distribution.  Superior left peri operculum region.  Posterior limb left internal capsule.  Posterior left temporal-occipital lobe.  Right parietal lobe.  Possibly right  cerebellum.  The fact that multiple vascular distributions are involved raises the possibility of embolic disease. Watershed type infarct as a cause of the parasagittal left convexity infarcts is a possibility but would not explain surrounding small infarcts.  Remote small infarcts left parietal and right frontal lobe. Remote left thalamic infarcts.  Mild to moderate small vessel disease type changes.  No intracranial hemorrhage.  Atrophy without hydrocephalus.  No intracranial mass lesion noted on this unenhanced exam.  Partially empty sella incidentally noted. Cervical medullary junction, pituitary region and orbital structures unremarkable.  Minimal paranasal sinus mucosal thickening.  MRA HEAD FINDINGS  Moderate to marked narrowing M1 segment right middle cerebral artery extending into the right middle cerebral artery bifurcation. Decrease number of visualized right middle cerebral artery branches.  Mild to moderate narrowing A1 -A2 aspect of the left anterior cerebral artery.  Left middle cerebral artery mild branch vessel irregularity.  Left vertebral artery is dominant. Areas of mild to moderate narrowing involving portions of the right vertebral artery.  Poor delineation of majority of the right posterior inferior cerebellar artery. Non visualization right posterior inferior cerebellar artery.  Mild to moderate narrowing proximal to mid basilar artery.  Poor delineation anterior inferior cerebellar artery.  Moderate narrowing superior cerebellar artery bilaterally.  Moderate tandem stenosis right posterior cerebral artery. Mild narrowing and irregularity left posterior cerebral artery.  No aneurysm detected.  IMPRESSION: Several small acute nonhemorrhagic infarcts in different vascular distributions as detailed above raising the possibility of embolic disease.  Intracranial atherosclerotic type changes as detailed above.  These results will be called to the ordering clinician or representative by the  Radiologist Assistant, and communication documented in the PACS Dashboard.   Electronically Signed   By: Bridgett LarssonSteve  Olson M.D.   On: 05/03/2013 16:17   Mr Brain Wo Contrast  05/03/2013   CLINICAL DATA:  Difficulty feeding himself which began 05/01/2013. Speech difficulty. Hypertension.  EXAM: MRI HEAD WITHOUT CONTRAST  MRA HEAD WITHOUT CONTRAST  TECHNIQUE: Multiplanar, multiecho pulse sequences of the brain and surrounding structures were obtained without intravenous contrast. Angiographic images of the head were obtained using MRA technique without contrast.  COMPARISON:  05/01/2013 head CT.  No comparison MR.  FINDINGS: MRI HEAD FINDINGS  Exam is motion degraded.  Scattered small acute nonhemorrhagic infarcts including:  Left frontal -parietal region in a slightly parasagittal distribution.  Superior left peri operculum region.  Posterior limb left internal capsule.  Posterior left temporal-occipital lobe.  Right parietal lobe.  Possibly right cerebellum.  The fact that multiple vascular distributions are involved raises the possibility of embolic disease. Watershed type infarct as a cause of the parasagittal left convexity infarcts is a possibility but would not explain surrounding small infarcts.  Remote small infarcts left parietal and right frontal lobe. Remote left thalamic infarcts.  Mild to moderate small vessel disease type changes.  No intracranial hemorrhage.  Atrophy without hydrocephalus.  No intracranial mass lesion noted on this unenhanced exam.  Partially empty sella incidentally noted. Cervical medullary junction, pituitary  region and orbital structures unremarkable.  Minimal paranasal sinus mucosal thickening.  MRA HEAD FINDINGS  Moderate to marked narrowing M1 segment right middle cerebral artery extending into the right middle cerebral artery bifurcation. Decrease number of visualized right middle cerebral artery branches.  Mild to moderate narrowing A1 -A2 aspect of the left anterior cerebral  artery.  Left middle cerebral artery mild branch vessel irregularity.  Left vertebral artery is dominant. Areas of mild to moderate narrowing involving portions of the right vertebral artery.  Poor delineation of majority of the right posterior inferior cerebellar artery. Non visualization right posterior inferior cerebellar artery.  Mild to moderate narrowing proximal to mid basilar artery.  Poor delineation anterior inferior cerebellar artery.  Moderate narrowing superior cerebellar artery bilaterally.  Moderate tandem stenosis right posterior cerebral artery. Mild narrowing and irregularity left posterior cerebral artery.  No aneurysm detected.  IMPRESSION: Several small acute nonhemorrhagic infarcts in different vascular distributions as detailed above raising the possibility of embolic disease.  Intracranial atherosclerotic type changes as detailed above.  These results will be called to the ordering clinician or representative by the Radiologist Assistant, and communication documented in the PACS Dashboard.   Electronically Signed   By: Bridgett Larsson M.D.   On: 05/03/2013 16:17   Dg Chest Portable 1 View  04/28/2013   CLINICAL DATA:  Shortness of breath.  EXAM: PORTABLE CHEST - 1 VIEW  COMPARISON:  DG CHEST 2 VIEW dated 11/08/2012  FINDINGS: The lungs are adequately inflated. The interstitial markings are increased diffusely. The cardiopericardial silhouette is enlarged. Its borders are somewhat indistinct. The central pulmonary vascularity is prominent but definite cephalization is not demonstrated. There is no pleural effusion or pneumothorax. The mediastinum is normal in width. There is calcification in the wall of the aortic arch. The observed portions of the bony structures exhibit no acute abnormalities.  IMPRESSION: Increased interstitial markings bilaterally are worrisome for interstitial edema of cardiac or noncardiac calls. One cannot exclude interstitial pneumonia in the appropriate clinical  setting. A followup PA and lateral chest x-ray would be of value when the patient can tolerate the procedure.   Electronically Signed   By: David  [place]   On: 04/28/2013 11:39   Dg Chest Port 1v Same Day  05/04/2013   CLINICAL DATA:  Increased wheezing.  Shortness of breath.  EXAM: PORTABLE CHEST - 1 VIEW SAME DAY  COMPARISON:  Chest x-ray 04/28/2013.  FINDINGS: Previously noted min discretion previously noted mild interstitial pulmonary edema has resolved. No consolidative airspace disease. No pleural effusions. Heart size is within normal limits. The patient is rotated to the left on today's exam, resulting in distortion of the mediastinal contours and reduced diagnostic sensitivity and specificity for mediastinal pathology. Atherosclerosis in the thoracic aorta.  IMPRESSION: 1. Resolution of previously noted mild congestive heart failure. No new acute findings. 2. Atherosclerosis.   Electronically Signed   By: Trudie Reed M.D.   On: 05/04/2013 21:00    PHYSICAL EXAM  patient is lying flat in bed. He is comfortable. There is no respiratory distress. He is not speaking much. Cardiac exam reveals that his rhythm is irregularly irregular. Lungs reveal scattered rhonchi. The abdomen is soft. There is no peripheral edema.   TELEMETRY:  I have reviewed telemetry today May 08, 2013. There is atrial fibrillation. Rate is controlled.   ASSESSMENT AND PLAN:    Acute CHF- presume secondary to AF and diastolic dysfunction. (Nl LVF 2010)     The patient's volume status is  stable. No change in therapy today.    Hypertension    Atrial fibrillation- unknown duration    Atrial fib continues. Rate is controlled.    NSTEMI - ? type 2 - Troponin 0.63   Dyspnea    Chronic renal insufficiency, stage III (moderate)     His labs show that renal function is actually relatively stable for him at this point.    PVD (peripheral vascular disease) 3.5cm AAA Aug 2014    CVA (cerebral infarction)     It is  my understanding that the patient is scheduled for carotid stenting tomorrow with Dr. Allyson Sabal. I do not know the timing that has been proposed. I have written for the patient to be n.p.o. after midnight tonight.   Willa Rough 05/08/2013 8:55 AM

## 2013-05-08 NOTE — Progress Notes (Signed)
ANTICOAGULATION CONSULT NOTE - Follow Up Consult  Pharmacy Consult for Heparin Indication: severe ICA stenosis, stroke  Allergies  Allergen Reactions  . Phenazopyridine Hcl Other (See Comments)    unknown  . Septra [Sulfamethoxazole-Tmp Ds] Other (See Comments)    unknown   Patient Measurements: Height: 5\' 2"  (157.5 cm) Weight: 152 lb 4.8 oz (69.083 kg) IBW/kg (Calculated) : 54.6 Heparin Dosing Weight: 65.9 kg  Vital Signs: Temp: 98.2 F (36.8 C) (03/28 2357) BP: 107/64 mmHg (03/28 2357) Pulse Rate: 78 (03/28 2357)  Labs:  Recent Labs  05/05/13 0547  05/06/13 0355 05/07/13 0548 05/07/13 1803 05/08/13 0440  HGB 11.8*  --  11.6* 12.5*  --  12.9*  HCT 35.0*  --  34.0* 35.6*  --  37.0*  PLT 193  --  186 191  --  208  HEPARINUNFRC  --   < > 0.58 0.56 0.40 0.27*  CREATININE 1.66*  --  1.49* 1.49*  --   --   < > = values in this interval not displayed.  Estimated Creatinine Clearance: 31.5 ml/min (by C-G formula based on Cr of 1.49).  Medications:  . [START ON 05/09/2013] sodium chloride    . heparin 800 Units/hr (05/07/13 1308)    Assessment: 80 YO M on a heparin drip s/p cath for multivessel disease, recent stroke thought to be cardioembolic secondary to Afib, and severe L ICA stenosis.  Plan is for ICA stenting on Monday 3/29.  Adjusted heparin level goal with recent stroke. Heparin level is just below goal range at 0.27 units/ml  No bleeding noted, CBC stable.  Goal of Therapy:  Heparin level 0.3-0.5 units/ml Monitor platelets by anticoagulation protocol: Yes   Plan:  - Increase heparin drip to 850 units/hr - 8 hr heparin level  [name], Pharm.D Clinical Pharmacist Pager: [phone] 05/08/2013 5:33 AM

## 2013-05-08 NOTE — Progress Notes (Signed)
Stroke Team Progress Note  HISTORY [name] is an 80 y.o. male with a history of heart failure, hypertension and abdominal aortic aneurysm was admitted on 04/28/2013 for management of atrial fibrillation and heart failure. Patient is currently on heparin drip for anticoagulation. At noon on 05/01/2013 his daughter noticed that he had difficulty feeding himself and she was unable to understand his speech. There is no previous history of stroke. NIH stroke score was 5. CT scan of his head showed no acute intracranial abnormality including no signs of acute infarction or acute stroke.  tPA was not considered as pt was anticoagulated on IV heparin.   SUBJECTIVE Resting comfortably, no acute overnight events.   OBJECTIVE Most recent Vital Signs: Filed Vitals:   05/07/13 2357 05/08/13 0400 05/08/13 0822 05/08/13 0928  BP: 107/64 99/55 121/85 102/60  Pulse: 78 83 75   Temp: 98.2 F (36.8 C) 99.6 F (37.6 C) 97.9 F (36.6 C)   TempSrc:   Oral   Resp: 16 16 16    Height:      Weight:  145 lb 1.6 oz (65.817 kg)    SpO2: 94% 97% 94%    CBG (last 3)  No results found for this basename: GLUCAP,  in the last 72 hours  IV Fluid Intake:   . [START ON 05/09/2013] sodium chloride    . heparin 850 Units/hr (05/08/13 0545)    MEDICATIONS  . allopurinol  200 mg Oral Daily  . aspirin EC  81 mg Oral Daily  . atorvastatin  40 mg Oral q1800  . carvedilol  3.125 mg Oral BID WC  . clopidogrel  75 mg Oral Q breakfast  . colchicine  0.3 mg Oral Daily  . dicyclomine  20 mg Oral QID  . pantoprazole  40 mg Oral Daily   PRN:  acetaminophen, alum & mag hydroxide-simeth, guaiFENesin, influenza vac split quadrivalent PF, levalbuterol, nitroGLYCERIN, ondansetron (ZOFRAN) IV, zolpidem  Diet:    2 thin liquids Activity:  OOB with assistance DVT Prophylaxis:  IV heparin  CLINICALLY SIGNIFICANT STUDIES Basic Metabolic Panel:   Recent Labs Lab 05/07/13 0548 05/08/13 0440  NA 135* 138  K 3.3* 3.6*   CL 99 102  CO2 20 21  GLUCOSE 120* 94  BUN 9 9  CREATININE 1.49* 1.48*  CALCIUM 9.0 9.1   Liver Function Tests:  No results found for this basename: AST, ALT, ALKPHOS, BILITOT, PROT, ALBUMIN,  in the last 168 hours CBC:   Recent Labs Lab 05/07/13 0548 05/08/13 0440  WBC 6.2 7.1  HGB 12.5* 12.9*  HCT 35.6* 37.0*  MCV 84.2 84.5  PLT 191 208   Coagulation:  No results found for this basename: LABPROT, INR,  in the last 168 hours Cardiac Enzymes:  No results found for this basename: CKTOTAL, CKMB, CKMBINDEX, TROPONINI,  in the last 168 hours Urinalysis: No results found for this basename: COLORURINE, APPERANCEUR, LABSPEC, PHURINE, GLUCOSEU, HGBUR, BILIRUBINUR, KETONESUR, PROTEINUR, UROBILINOGEN, NITRITE, LEUKOCYTESUR,  in the last 168 hours Lipid Panel    Component Value Date/Time   CHOL 206* 04/29/2013 0326   TRIG 114 04/29/2013 0326   HDL 39* 04/29/2013 0326   CHOLHDL 5.3 04/29/2013 0326   VLDL 23 04/29/2013 0326   LDLCALC 144* 04/29/2013 0326   HgbA1C  Lab Results  Component Value Date   HGBA1C 6.0* 05/03/2013    Urine Drug Screen:   No results found for this basename: labopia,  cocainscrnur,  labbenz,  amphetmu,  thcu,  labbarb  Alcohol Level: No results found for this basename: ETH,  in the last 168 hours   CT of the brain  05/01/2013    1. No acute intracranial abnormalities. 2. Mild cerebral atrophy. 3. Chronic microvascular ischemic changes in the cerebral white matter.     MRI of the brain  05/02/2013 Several small acute nonhemorrhagic infarcts in different vascular distributions as detailed above raising the possibility of embolic disease.   MRA of the brain  05/02/2013 Intracranial atherosclerotic type changes   2D Echocardiogram  Diffuse hypokineisis worse in the inferior wall and apex The cavity size was mildly dilated. Wall thickness was normal. The estimated ejection fraction was 35%. Moderate MV regurgitation.  Carotid Doppler  RICA velocities 1-39% and  the LICA velocities are 80-99%. Vertebral artery flow is patent and flow is antegrade, bilaterally.   CXR  04/28/2013 Increased interstitial markings bilaterally are worrisome for  interstitial edema of cardiac or noncardiac calls. One cannot exclude interstitial pneumonia in the appropriate clinical setting.  A followup PA and lateral chest x-ray would be of value when the patient can tolerate the procedure.  EKG  atrial fibrillation. For complete results please see formal report.   Therapy Recommendations HH PT, OT  Physical Exam   Frail elderly sian male not in distress.Awake alert. Afebrile. Head is nontraumatic. Neck is supple without bruit.  . Cardiac exam no murmur or gallop. Lungs are clear to auscultation. Distal pulses are well felt. Neurological Exam : Highly limited as patient and family members in the room but not speaking. His awake alert able to speak in his language fluently. Extraocular movements are full range without nystagmus. Face is symmetric. Tongue is midline. Mild left hemiparesis  but able to hold upper  and lower extremity up against gravity. Sensation and coordination cannot be reliably tested. Gait was not tested.  ASSESSMENT [name] is a 80 y.o. male initially admitted for atrial fibrillation and heart failure, who then developed new-onset slurred speech and right-sided weakness in the hospital. MRI confirmed bilateral, multiple vascular territory infarcts felt to be embolic secondary to known atrial fibrillation and/or L ICA stenosis > 80%. Carotid stent to be considered. On aspirin 81 mg orally every day and heparin at the time of consult. Now on aspirin 81 mg, IV heparin, and Plavix for secondary stroke prevention.   L ICA stenosis > 80% Hypertension atrial fibrillation NSTEMI - cath with diseast - medical management only Acute CHF, felt to be secondary to atrial fibrillation and diastolic dysfunction Chronic renal insufficiency, stage III  (moderate) PVD  Hyperlipidemia, LDL 144, on No statin PTA, now on lipitor 40 mg daily, goal LDL < 100   Hospital day # 10  TREATMENT/PLAN  Continue IV heparin, aspirin 81 mg, and Plavix for secondary stroke prevention for now. Plavix added as discussed with Dr. Excell Seltzer. No plan for oral anticoagulation at this time due to risk of bleeding on dual antiplatelets planned at discharge for carotid stenting.   Dr. Allyson Sabal has seen patient for possible carotid stenting. This option was discussed with the patient's family. They have agreed to move forward and the procedure has been scheduled for Monday.  Home health PT and OT   Elspeth Cho, D.O.  Neurology-Stroke   To contact Stroke Continuity provider, please refer to [email or web address]. After hours, contact General Neurology

## 2013-05-09 ENCOUNTER — Encounter (HOSPITAL_COMMUNITY)
Admission: EM | Disposition: A | Discharge status: Home Health | Payer: PRIVATE HEALTH INSURANCE | Source: Home / Self Care | Attending: Cardiology

## 2013-05-09 DIAGNOSIS — I6529 Occlusion and stenosis of unspecified carotid artery: Secondary | ICD-10-CM

## 2013-05-09 HISTORY — PX: CAROTID STENT INSERTION: SHX5505

## 2013-05-09 LAB — BASIC METABOLIC PANEL
BUN: 9 mg/dL (ref 6–23)
CHLORIDE: 100 meq/L (ref 96–112)
CO2: 21 mEq/L (ref 19–32)
Calcium: 8.9 mg/dL (ref 8.4–10.5)
Creatinine, Ser: 1.53 mg/dL — ABNORMAL HIGH (ref 0.50–1.35)
GFR, EST AFRICAN AMERICAN: 46 mL/min — AB (ref >90)
GFR, EST NON AFRICAN AMERICAN: 40 mL/min — AB (ref >90)
Glucose, Bld: 103 mg/dL — ABNORMAL HIGH (ref 70–99)
POTASSIUM: 3.9 meq/L (ref 3.7–5.3)
SODIUM: 136 meq/L — AB (ref 137–147)

## 2013-05-09 LAB — CBC
HCT: 35 % — ABNORMAL LOW (ref 39.0–52.0)
HEMOGLOBIN: 12.1 g/dL — AB (ref 13.0–17.0)
MCH: 29.4 pg (ref 26.0–34.0)
MCHC: 34.6 g/dL (ref 30.0–36.0)
MCV: 85 fL (ref 78.0–100.0)
Platelets: 196 10*3/uL (ref 150–400)
RBC: 4.12 MIL/uL — ABNORMAL LOW (ref 4.22–5.81)
RDW: 14.4 % (ref 11.5–15.5)
WBC: 6.9 10*3/uL (ref 4.0–10.5)

## 2013-05-09 LAB — HEPARIN LEVEL (UNFRACTIONATED): Heparin Unfractionated: 0.48 IU/mL (ref 0.30–0.70)

## 2013-05-09 LAB — POCT ACTIVATED CLOTTING TIME: Activated Clotting Time: 393 seconds

## 2013-05-09 SURGERY — CAROTID STENT INSERTION
Anesthesia: LOCAL

## 2013-05-09 MED ORDER — BIVALIRUDIN 250 MG IV SOLR
INTRAVENOUS | Status: AC
  Filled 2013-05-09: qty 250

## 2013-05-09 MED ORDER — NOREPINEPHRINE BITARTRATE 1 MG/ML IJ SOLN
2.0000 ug/min | INTRAVENOUS | Status: DC
  Filled 2013-05-09: qty 4

## 2013-05-09 MED ORDER — ASPIRIN EC 325 MG PO TBEC
325.0000 mg | DELAYED_RELEASE_TABLET | Freq: Every day | ORAL | Status: DC
  Administered 2013-05-09 – 2013-05-11 (×2): 325 mg via ORAL
  Filled 2013-05-09 (×3): qty 1

## 2013-05-09 MED ORDER — SODIUM CHLORIDE 0.9 % IV SOLN
INTRAVENOUS | Status: AC
  Administered 2013-05-09: 11:00:00 via INTRAVENOUS

## 2013-05-09 MED ORDER — ATROPINE SULFATE 0.1 MG/ML IJ SOLN
INTRAMUSCULAR | Status: AC
  Filled 2013-05-09: qty 10

## 2013-05-09 MED ORDER — HEPARIN (PORCINE) IN NACL 2-0.9 UNIT/ML-% IJ SOLN
INTRAMUSCULAR | Status: AC
  Filled 2013-05-09: qty 1000

## 2013-05-09 MED ORDER — CLOPIDOGREL BISULFATE 75 MG PO TABS: 75.0000 mg | ORAL_TABLET | Freq: Every day | ORAL | Status: DC

## 2013-05-09 MED ORDER — PHENYLEPHRINE HCL 10 MG/ML IJ SOLN
15.0000 ug/min | INTRAVENOUS | Status: DC
  Administered 2013-05-09: 15 ug/min via INTRAVENOUS
  Administered 2013-05-10: 20 ug/min via INTRAVENOUS
  Administered 2013-05-10: 30 ug/min via INTRAVENOUS
  Administered 2013-05-11: 5 ug/min via INTRAVENOUS
  Filled 2013-05-09 (×4): qty 1

## 2013-05-09 MED ORDER — LIDOCAINE HCL (PF) 1 % IJ SOLN
INTRAMUSCULAR | Status: AC
  Filled 2013-05-09: qty 30

## 2013-05-09 MED ORDER — PHENYLEPHRINE HCL 10 MG/ML IJ SOLN
INTRAMUSCULAR | Status: AC
  Filled 2013-05-09: qty 1

## 2013-05-09 MED ORDER — SODIUM CHLORIDE 0.9 % IV SOLN: INTRAVENOUS | Status: DC | PRN

## 2013-05-09 MED ORDER — NOREPINEPHRINE BITARTRATE 1 MG/ML IJ SOLN
INTRAMUSCULAR | Status: AC
  Filled 2013-05-09: qty 4

## 2013-05-09 NOTE — Progress Notes (Signed)
Notified Dr Allyson Sabal of pt's c/o 6 out of 10 headache, 650mg  Tylenol given, pupils equal and reactive at 65mm, and pt's interpretor states he is A/O x 4. Will continue to monitor closely.   Dawson Bills, RN

## 2013-05-09 NOTE — CV Procedure (Addendum)
[name] is a 80 y.o. male    [id number] LOCATION:  FACILITY: [facility]  PHYSICIAN: Nanetta BattyJonathan Ancel Easler, M.D. 04/06/1926   DATE OF PROCEDURE:  05/09/2013  DATE OF DISCHARGE:     PV Angiogram/Intervention    History obtained from chart review.[name] is a 80 year old married [place] male admitted for congestive heart failure. He was found to be in atrial fibrillation and apparently converted to sinus rhythm. The echocardiogram revealed severe dysfunction with an ejection fraction in the 35% range. He had stroke like symptoms and was evaluated by the stroke service. An MRI suggested multiple embolic strokes possibly related to the atial fibrillation.carotid Dopplers were performed and suggested high-grade left internal carotid artery stenosis with string sign". The patient underwent cardiac catheterization by Dr. [place] the right radial approach that revealed 80% left main stenosis with an occluded dominant RCA.. Consensus was to treat him medically for this. I was asked to see the patient for consideration of left internal carotid artery stenting. Extensive conversation was had with the patient and his family and after careful consideration of the risks and benefits of medical therapy versus percutaneous intervention, it was decided to proceed with carotid artery stenting.  Operators: Runell GessJonathan J. Welton Bord MD, Fabienne Brunsharles Fields MD    PROCEDURE DESCRIPTION:   The patient was brought to the second floor [place] cath lab in the postabsorptive state. He was not premedicated . His right groinwas prepped and shaved in usual sterile fashion. Xylocaine 1% was used for local anesthesia. A 6 French sheath was inserted into the right common femoral artery using standard Seldinger technique. A 5 French pigtail catheter was used for aortic arch angiography in the LAO view. A 5 French JB1 diagnostic catheter was used for selective coronary angiography intra-and extracranial views mirror-image  radiology will interpret the intracranial anatomy. Visipaque dye was used for the entirety of the case. Retrograde aorta pressure was monitored the case.   HEMODYNAMICS:    AO SYSTOLIC/AO DIASTOLIC: 144/91   Angiographic Data:   1: Aortic arch angiogram-this was a type III arch  2: Left carotid artery-there was a 80-90% proximal left internal carotid artery stenosis noted.  IMPRESSION:Mr.[name] has a high-grade left internal carotid artery stenosis which is symptomatic from. He is high risk for endarterectomy given his severe LV dysfunction and left main/three-vessel disease. He has a type III arch making access somewhat technically challenging. Will proceed with attempt at PTA and stenting using Angiomax, dual antiplatelet therapy (aspirin, Plavix) and Nitinol self-expanding stent along with distal protection.  Procedure Description:I was able to maintain access in the left common carotid artery with the JB 1 diagnostic catheter. I then exchanged over a 0.35 long Amplatz straight wire and was able to position the 6 [place] shuttle sheath carefully around the multiple bends into the body of the left common carotid artery. The patient received Angiomax bolus with an ACT of 393. A total of 103 cc of contrast was administered to the patient. I then was carefully able to position a NAV 6 distal protection device in the left internal carotid artery beyond the stenosis. I predilated the lesion with a 3 mm x 2 cm balloon and then placed 87/9 mm tapered by 30 mm long exact Abbott Nitinol self-expanding stent across the lesion extending into the common carotid artery. The patient then received 1 mg of atropine prior to post dilatation with a 5 mm x 2 cm balloon. He did transiently become bradycardic and hypotensive requiring administration of Neo-Synephrine both  in bolus and infusion which fairly rapidly improve his blood pressure. He remained hemodynamically stable after that as well as neurologically  intact. Completion angiography was then performed as well as intracranial views in the AP view and the distal protection device was captured. The shuttle sheath was then exchanged over the 0.35 VersaCore  wire for a short 6 [place] sheath.  Final Impression: successful PTA and stenting of high-grade left internal carotid artery stenosis for symptomatic high risk disease in the setting of severe LV dysfunction and left main/three-vessel disease. The patient tolerated the procedure well. The stent was successfully deployed and postdilated with an acceptable angiographic result. The sheath was then secured and the patient left the Cath Lab in stable condition after it was determined that he was neurologically intact. He is in atrial percolation on stool antibiotic therapy. I believe he would be high risk to have oral anti-coagulation in addition to his platelet therapy and therefore recommend transitioning to a novel oral anticoagulant after 3 months of dual antiplatelet therapy at which time we can stop the Plavix.    [name], [facility] 05/09/2013 9:07 AM

## 2013-05-09 NOTE — Progress Notes (Signed)
05/09/13 0830  OT Visit Information  Last OT Received On 05/09/13  Reason Eval/Treat Not Completed Patient at procedure or test/ unavailable   Jenell Milliner, OTR/L [phone]

## 2013-05-09 NOTE — Progress Notes (Signed)
PT Cancellation Note  Patient Details Name: Gobel Aslin MRN: [id number] DOB: October 26, 1926   Cancelled Treatment:    Reason Eval/Treat Not Completed: Patient at procedure or test/unavailable. Pt at cath lab. Will follow.   Ralene Bathe Kistler 05/09/2013, 10:24 AM [phone]

## 2013-05-09 NOTE — Progress Notes (Signed)
Stroke Team Progress Note  HISTORY [name] is an 80 y.o. male with a history of heart failure, hypertension and abdominal aortic aneurysm was admitted on 04/28/2013 for management of atrial fibrillation and heart failure. Patient is currently on heparin drip for anticoagulation. At noon on 05/01/2013 his daughter noticed that he had difficulty feeding himself and she was unable to understand his speech. There is no previous history of stroke. NIH stroke score was 5. CT scan of his head showed no acute intracranial abnormality including no signs of acute infarction or acute stroke.  tPA was not considered as pt was anticoagulated on IV heparin.   SUBJECTIVE Multiple family members at bedside. Pt just back from getting carotid stent.  OBJECTIVE Most recent Vital Signs: Filed Vitals:   05/08/13 2000 05/09/13 0100 05/09/13 0500 05/09/13 0753  BP: 129/76 117/62 110/55   Pulse: 86 88 87 91  Temp: 97.5 F (36.4 C) 98.2 F (36.8 C) 97.8 F (36.6 C)   TempSrc:      Resp: 16 16 16    Height:      Weight:   65.635 kg (144 lb 11.2 oz)   SpO2: 100% 100% 99%    CBG (last 3)  No results found for this basename: GLUCAP,  in the last 72 hours  IV Fluid Intake:   . sodium chloride      MEDICATIONS  . allopurinol  200 mg Oral Daily  . aspirin EC  325 mg Oral Daily  . aspirin EC  81 mg Oral Daily  . atorvastatin  40 mg Oral q1800  . carvedilol  3.125 mg Oral BID WC  . clopidogrel  75 mg Oral Q breakfast  . colchicine  0.3 mg Oral Daily  . dicyclomine  20 mg Oral QID  . pantoprazole  40 mg Oral Daily   PRN:  acetaminophen, alum & mag hydroxide-simeth, guaiFENesin, influenza vac split quadrivalent PF, nitroGLYCERIN, ondansetron (ZOFRAN) IV, zolpidem  Diet:  Clear Liquid  Activity:  OOB with assistance DVT Prophylaxis:  IV heparin  CLINICALLY SIGNIFICANT STUDIES Basic Metabolic Panel:   Recent Labs Lab 05/08/13 0440 05/09/13 0515  NA 138 136*  K 3.6* 3.9  CL 102 100  CO2 21  21  GLUCOSE 94 103*  BUN 9 9  CREATININE 1.48* 1.53*  CALCIUM 9.1 8.9   Liver Function Tests:  No results found for this basename: AST, ALT, ALKPHOS, BILITOT, PROT, ALBUMIN,  in the last 168 hours CBC:   Recent Labs Lab 05/08/13 0440 05/09/13 0515  WBC 7.1 6.9  HGB 12.9* 12.1*  HCT 37.0* 35.0*  MCV 84.5 85.0  PLT 208 196   Coagulation:  No results found for this basename: LABPROT, INR,  in the last 168 hours Cardiac Enzymes:  No results found for this basename: CKTOTAL, CKMB, CKMBINDEX, TROPONINI,  in the last 168 hours Urinalysis: No results found for this basename: COLORURINE, APPERANCEUR, LABSPEC, PHURINE, GLUCOSEU, HGBUR, BILIRUBINUR, KETONESUR, PROTEINUR, UROBILINOGEN, NITRITE, LEUKOCYTESUR,  in the last 168 hours Lipid Panel    Component Value Date/Time   CHOL 206* 04/29/2013 0326   TRIG 114 04/29/2013 0326   HDL 39* 04/29/2013 0326   CHOLHDL 5.3 04/29/2013 0326   VLDL 23 04/29/2013 0326   LDLCALC 144* 04/29/2013 0326   HgbA1C  Lab Results  Component Value Date   HGBA1C 6.0* 05/03/2013    Urine Drug Screen:   No results found for this basename: labopia,  cocainscrnur,  labbenz,  amphetmu,  thcu,  labbarb  Alcohol Level: No results found for this basename: ETH,  in the last 168 hours   CT of the brain  05/01/2013    1. No acute intracranial abnormalities. 2. Mild cerebral atrophy. 3. Chronic microvascular ischemic changes in the cerebral white matter.     MRI of the brain  05/02/2013 Several small acute nonhemorrhagic infarcts in different vascular distributions as detailed above raising the possibility of embolic disease.   MRA of the brain  05/02/2013 Intracranial atherosclerotic type changes   2D Echocardiogram  Diffuse hypokineisis worse in the inferior wall and apex The cavity size was mildly dilated. Wall thickness was normal. The estimated ejection fraction was 35%. Moderate MV regurgitation.  Carotid Doppler  RICA velocities 1-39% and the LICA velocities  are 80-99%. Vertebral artery flow is patent and flow is antegrade, bilaterally.   CXR  05/04/2013 1. Resolution of previously noted mild congestive heart failure. No new acute findings. 2. Atherosclerosis.  EKG  atrial fibrillation. For complete results please see formal report.   Therapy Recommendations HH PT, OT  Physical Exam   Frail elderly sian male not in distress.Awake alert. Afebrile. Head is nontraumatic. Neck is supple without bruit.  . Cardiac exam no murmur or gallop. Lungs are clear to auscultation. Distal pulses are well felt. Neurological Exam : Highly limited as patient and family members in the room but not speaking. His awake alert able to speak in his language fluently. Extraocular movements are full range without nystagmus. Face is symmetric. Tongue is midline. Mild left hemiparesis  but able to hold upper  and lower extremity up against gravity. Sensation and coordination cannot be reliably tested. Gait was not tested.  ASSESSMENT [name] is a 80 y.o. male initially admitted for atrial fibrillation and heart failure, who then developed new-onset slurred speech and right-sided weakness in the hospital. MRI confirmed bilateral, multiple vascular territory infarcts felt to be embolic secondary to known atrial fibrillation and/or L ICA stenosis > 80%. Carotid stent to be considered. On aspirin 81 mg orally every day and heparin at the time of consult. Now on aspirin 325 mg and Plavix for secondary stroke prevention.   L ICA stenosis > 80%, carotid stent placed by Dr. Allyson SabalBerry 05/09/13 Hypertension atrial fibrillation NSTEMI - cath with diseast - medical management only Acute CHF, felt to be secondary to atrial fibrillation and diastolic dysfunction Chronic renal insufficiency, stage III (moderate) PVD  Hyperlipidemia, LDL 144, on No statin PTA, now on lipitor 40 mg daily, goal LDL < 100   Hospital day # 11  TREATMENT/PLAN  Continue aspirin 325 mg, and Plavix  for secondary stroke. No plan for oral anticoagulation at this time due to risk of bleeding on dual antiplatelets.  Home health PT and OT  Agree with plans for anticipated discharge tomorrow  Annie MainSHARON BIBY, MSN, RN, ANVP-BC, ANP-BC, Lawernce IonGNP-BC [facility] Pager: [phone] 05/09/2013 10:38 AM  I have personally obtained a history, examined the patient, evaluated imaging results, and formulated the assessment and plan of care. I agree with the above.  Delia HeadyPramod Estefano Victory, MD  To contact Stroke Continuity provider, please refer to [email or web address]. After hours, contact General Neurology

## 2013-05-09 NOTE — CV Procedure (Signed)
Cath Lab Note: Right groin arterial sheath pulled, manual pressure held x 25 minutes without complication. Right groin is a level 0 without hematoma or oozing. Interpreter present and instructions given to the patient at this time. Dutch Quint, RRT RCIS.

## 2013-05-09 NOTE — Interval H&P Note (Signed)
History and Physical Interval Note:  05/09/2013 7:35 AM  [name]  has presented today for surgery, with the diagnosis of Carotid Stenosis  The various methods of treatment have been discussed with the patient and family. After consideration of risks, benefits and other options for treatment, the patient has consented to  Procedure(s): CAROTID STENT INSERTION (N/A) as a surgical intervention .  The patient's history has been reviewed, patient examined, no change in status, stable for surgery.  I have reviewed the patient's chart and labs.  Questions were answered to the patient's satisfaction.     Runell Gess

## 2013-05-09 NOTE — H&P (View-Only) (Signed)
Subjective:  No chest pain or shortness of breath. History extremely limited.  Objective:  Vital Signs in the last 24 hours: Temp:  [97.8 F (36.6 C)-99.5 F (37.5 C)] 99.5 F (37.5 C) (03/26 0000) Pulse Rate:  [72-94] 94 (03/26 0000) Resp:  [20-21] 20 (03/25 1612) BP: (95-150)/(58-87) 148/77 mmHg (03/26 0000) SpO2:  [96 %-100 %] 98 % (03/26 0000)  Intake/Output from previous day: 03/25 0701 - 03/26 0700 In: 591 [I.V.:591] Out: -   Physical Exam: Pt is alert elderly male in NAD HEENT: normal Neck: JVP - normal Lungs: CTA bilaterally CV: RRR without murmur or gallop Abd: soft, NT, Positive BS, no hepatomegaly Ext: no C/C/E, distal pulses intact and equal Skin: warm/dry no rash   Lab Results:  Recent Labs  05/04/13 0317 05/05/13 0547  WBC 7.3 5.8  HGB 12.5* 11.8*  PLT 202 193    Recent Labs  05/04/13 0317 05/05/13 0547  NA 136* 137  K 4.3 3.9  CL 101 103  CO2 22 21  GLUCOSE 95 106*  BUN 23 17  CREATININE 1.72* 1.66*   No results found for this basename: TROPONINI, CK, MB,  in the last 72 hours  Cardiac Studies: Cardiac cath: Procedural Findings:  Hemodynamics:  AO 125/72 mean 95 mm Hg  LV 130/11 mm Hg  Coronary angiography:  Coronary dominance: right  Left mainstem: There is 60-70% stenosis in the distal left main.  Left anterior descending (LAD): The LAD is a large vessel extending around the apex. In the mid vessel there is a 60-70% stenosis. The first diagonal is small in caliber with diffuse 80-90% disease in the mid vessel. The second diagonal is also small in caliber with 80% disease proximally.  Ramus intermediate: This is a large branch without significant disease.  Left circumflex (LCx): The LCx is small and gives rise to 2 small OM branches. There is 90% disease at the origin of the LCx. There is 80% disease at the bifurcation of the OMs.  Right coronary artery (RCA): The RCA is diffusely diseased in the proximal vessel and 100%  occluded in the mid vessel. There are right to right and left to right collaterals to the distal RCA.  Left ventriculography: No done.  Final Conclusions:  1. 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 His most severe disease is in the small diagonal and LCx branches.  2. Normal LV EDP.  Recommendations: Given age and co-morbidities I would recommend medical therapy.  [name]  05/04/2013, 3:12 PM   Carotid US: Summary: There is mild mixed plaque in the right ECA. RICA velocities are in the 1-39% range. On the left there is severe mixedplaque in the bulb, ECA and ICA. LICA velocities are severely elevated in the proximal LICA, with post-stenotic turbulence and dilation. By color Doppler, there is near string sign flow in the proximal LICA. The distal LICA is patent. LICA velocities are in the 80-99% range. The LECA has severe mixed plaque withshadowing, possibly occluded.The vertebral arteries are patent with antegrade flow, bilaterally. The RICA ratio is .97 and the LICA ratio is 20.4.  Other specific details can be found in the table(s) above. Prepared and Electronically Authenticated by  Delia Heady 2015-03-25T10:48:[id number]  Tele: Sinus rhythm, personally reviewed.  Assessment/Plan:  1. Atrial fibrillation, currently maintaining sinus rhythm. Patient is on low-dose carvedilol. He will be managed with dual antiplatelet therapy (see below).  2. Non-ST elevation MI. Suspect type II event in the setting of stable  but significant CAD. Cardiac catheterization reviewed yesterday. The patient has chronic occlusion of the right coronary artery, moderate left mainstem stenosis, and severe small vessel disease. Medical therapy has been recommended and I agree with this approach. He will be treated with aspirin and Plavix. The patient is on statin and low-dose beta blocker.  3. Acute systolic heart failure. Severe LV dysfunction noted. 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 No ACE or ARB secondary to chronic kidney disease. Will add low-dose hydralazine today but need to be cautious not to drop this patient's blood pressure in the setting of severe carotid stenosis.  4. Severe left internal carotid artery stenosis. Velocities approximately 600/300. Carotid stenting recommended by neurology team to be done prior to discharge. Will go ahead and start the patient on Plavix today. Dr. Berry to see.  5. Stroke. Management per neurology team. Appreciate their input.  [name] 05/05/2013 6:42 AM    [name], M.D. 05/05/2013, 6:31 AM     

## 2013-05-09 NOTE — Progress Notes (Signed)
Patient ID: [name], male   DOB: 07/24/1926, 80 y.o.   MRN: [id number]    SUBJECTIVE:  The patient went for carotid stenting today.   Filed Vitals:   05/08/13 2000 05/09/13 0100 05/09/13 0500 05/09/13 0753  BP: 129/76 117/62 110/55   Pulse: 86 88 87 91  Temp: 97.5 F (36.4 C) 98.2 F (36.8 C) 97.8 F (36.6 C)   TempSrc:      Resp: 16 16 16    Height:      Weight:   144 lb 11.2 oz (65.635 kg)   SpO2: 100% 100% 99%      Intake/Output Summary (Last 24 hours) at 05/09/13 1033 Last data filed at 05/08/13 2300  Gross per 24 hour  Intake    274 ml  Output      0 ml  Net    274 ml    LABS: Basic Metabolic Panel:  Recent Labs  57/84/6903/29/15 0440 05/09/13 0515  NA 138 136*  K 3.6* 3.9  CL 102 100  CO2 21 21  GLUCOSE 94 103*  BUN 9 9  CREATININE 1.48* 1.53*  CALCIUM 9.1 8.9   Liver Function Tests: No results found for this basename: AST, ALT, ALKPHOS, BILITOT, PROT, ALBUMIN,  in the last 72 hours No results found for this basename: LIPASE, AMYLASE,  in the last 72 hours CBC:  Recent Labs  05/08/13 0440 05/09/13 0515  WBC 7.1 6.9  HGB 12.9* 12.1*  HCT 37.0* 35.0*  MCV 84.5 85.0  PLT 208 196   Cardiac Enzymes: No results found for this basename: CKTOTAL, CKMB, CKMBINDEX, TROPONINI,  in the last 72 hours BNP: No components found with this basename: POCBNP,  D-Dimer: No results found for this basename: DDIMER,  in the last 72 hours Hemoglobin A1C: No results found for this basename: HGBA1C,  in the last 72 hours Fasting Lipid Panel: No results found for this basename: CHOL, HDL, LDLCALC, TRIG, CHOLHDL, LDLDIRECT,  in the last 72 hours Thyroid Function Tests: No results found for this basename: TSH, T4TOTAL, FREET3, T3FREE, THYROIDAB,  in the last 72 hours  RADIOLOGY: Ct Head Wo Contrast  05/01/2013   CLINICAL DATA:  Of evaluate for potential left sided cerebral hemorrhage or stroke. Atrial fibrillation.  EXAM: CT HEAD WITHOUT CONTRAST  TECHNIQUE:  Contiguous axial images were obtained from the base of the skull through the vertex without intravenous contrast.  COMPARISON:  Head CT 11/08/2012.  FINDINGS: Mild cerebral atrophy. Patchy and confluent areas of decreased attenuation are noted throughout the deep and periventricular white matter of the cerebral hemispheres bilaterally, compatible with chronic microvascular ischemic disease. No acute intracranial abnormalities. Specifically, no evidence of acute intracranial hemorrhage, no definite findings of acute/subacute cerebral ischemia, no mass, mass effect, hydrocephalus or abnormal intra or extra-axial fluid collections. Visualized paranasal sinuses and mastoids are well pneumatized. No acute displaced skull fractures are identified.  IMPRESSION: 1. No acute intracranial abnormalities. 2. Mild cerebral atrophy. 3. Chronic microvascular ischemic changes in the cerebral white matter.   Electronically Signed   By: Trudie Reedaniel  Entrikin M.D.   On: 05/01/2013 21:20   Mr Maxine GlennMra Head Wo Contrast  05/03/2013   CLINICAL DATA:  Difficulty feeding himself which began 05/01/2013. Speech difficulty. Hypertension.  EXAM: MRI HEAD WITHOUT CONTRAST  MRA HEAD WITHOUT CONTRAST  TECHNIQUE: Multiplanar, multiecho pulse sequences of the brain and surrounding structures were obtained without intravenous contrast. Angiographic images of the head were obtained using MRA technique without contrast.  COMPARISON:  05/01/2013 head  CT.  No comparison MR.  FINDINGS: MRI HEAD FINDINGS  Exam is motion degraded.  Scattered small acute nonhemorrhagic infarcts including:  Left frontal -parietal region in a slightly parasagittal distribution.  Superior left peri operculum region.  Posterior limb left internal capsule.  Posterior left temporal-occipital lobe.  Right parietal lobe.  Possibly right cerebellum.  The fact that multiple vascular distributions are involved raises the possibility of embolic disease. Watershed type infarct as a cause of the  parasagittal left convexity infarcts is a possibility but would not explain surrounding small infarcts.  Remote small infarcts left parietal and right frontal lobe. Remote left thalamic infarcts.  Mild to moderate small vessel disease type changes.  No intracranial hemorrhage.  Atrophy without hydrocephalus.  No intracranial mass lesion noted on this unenhanced exam.  Partially empty sella incidentally noted. Cervical medullary junction, pituitary region and orbital structures unremarkable.  Minimal paranasal sinus mucosal thickening.  MRA HEAD FINDINGS  Moderate to marked narrowing M1 segment right middle cerebral artery extending into the right middle cerebral artery bifurcation. Decrease number of visualized right middle cerebral artery branches.  Mild to moderate narrowing A1 -A2 aspect of the left anterior cerebral artery.  Left middle cerebral artery mild branch vessel irregularity.  Left vertebral artery is dominant. Areas of mild to moderate narrowing involving portions of the right vertebral artery.  Poor delineation of majority of the right posterior inferior cerebellar artery. Non visualization right posterior inferior cerebellar artery.  Mild to moderate narrowing proximal to mid basilar artery.  Poor delineation anterior inferior cerebellar artery.  Moderate narrowing superior cerebellar artery bilaterally.  Moderate tandem stenosis right posterior cerebral artery. Mild narrowing and irregularity left posterior cerebral artery.  No aneurysm detected.  IMPRESSION: Several small acute nonhemorrhagic infarcts in different vascular distributions as detailed above raising the possibility of embolic disease.  Intracranial atherosclerotic type changes as detailed above.  These results will be called to the ordering clinician or representative by the Radiologist Assistant, and communication documented in the PACS Dashboard.   Electronically Signed   By: Bridgett Larsson M.D.   On: 05/03/2013 16:17   Mr Brain Wo  Contrast  05/03/2013   CLINICAL DATA:  Difficulty feeding himself which began 05/01/2013. Speech difficulty. Hypertension.  EXAM: MRI HEAD WITHOUT CONTRAST  MRA HEAD WITHOUT CONTRAST  TECHNIQUE: Multiplanar, multiecho pulse sequences of the brain and surrounding structures were obtained without intravenous contrast. Angiographic images of the head were obtained using MRA technique without contrast.  COMPARISON:  05/01/2013 head CT.  No comparison MR.  FINDINGS: MRI HEAD FINDINGS  Exam is motion degraded.  Scattered small acute nonhemorrhagic infarcts including:  Left frontal -parietal region in a slightly parasagittal distribution.  Superior left peri operculum region.  Posterior limb left internal capsule.  Posterior left temporal-occipital lobe.  Right parietal lobe.  Possibly right cerebellum.  The fact that multiple vascular distributions are involved raises the possibility of embolic disease. Watershed type infarct as a cause of the parasagittal left convexity infarcts is a possibility but would not explain surrounding small infarcts.  Remote small infarcts left parietal and right frontal lobe. Remote left thalamic infarcts.  Mild to moderate small vessel disease type changes.  No intracranial hemorrhage.  Atrophy without hydrocephalus.  No intracranial mass lesion noted on this unenhanced exam.  Partially empty sella incidentally noted. Cervical medullary junction, pituitary region and orbital structures unremarkable.  Minimal paranasal sinus mucosal thickening.  MRA HEAD FINDINGS  Moderate to marked narrowing M1 segment right middle cerebral artery  extending into the right middle cerebral artery bifurcation. Decrease number of visualized right middle cerebral artery branches.  Mild to moderate narrowing A1 -A2 aspect of the left anterior cerebral artery.  Left middle cerebral artery mild branch vessel irregularity.  Left vertebral artery is dominant. Areas of mild to moderate narrowing involving portions of  the right vertebral artery.  Poor delineation of majority of the right posterior inferior cerebellar artery. Non visualization right posterior inferior cerebellar artery.  Mild to moderate narrowing proximal to mid basilar artery.  Poor delineation anterior inferior cerebellar artery.  Moderate narrowing superior cerebellar artery bilaterally.  Moderate tandem stenosis right posterior cerebral artery. Mild narrowing and irregularity left posterior cerebral artery.  No aneurysm detected.  IMPRESSION: Several small acute nonhemorrhagic infarcts in different vascular distributions as detailed above raising the possibility of embolic disease.  Intracranial atherosclerotic type changes as detailed above.  These results will be called to the ordering clinician or representative by the Radiologist Assistant, and communication documented in the PACS Dashboard.   Electronically Signed   By: Bridgett Larsson M.D.   On: 05/03/2013 16:17   Dg Chest Portable 1 View  04/28/2013   CLINICAL DATA:  Shortness of breath.  EXAM: PORTABLE CHEST - 1 VIEW  COMPARISON:  DG CHEST 2 VIEW dated 11/08/2012  FINDINGS: The lungs are adequately inflated. The interstitial markings are increased diffusely. The cardiopericardial silhouette is enlarged. Its borders are somewhat indistinct. The central pulmonary vascularity is prominent but definite cephalization is not demonstrated. There is no pleural effusion or pneumothorax. The mediastinum is normal in width. There is calcification in the wall of the aortic arch. The observed portions of the bony structures exhibit no acute abnormalities.  IMPRESSION: Increased interstitial markings bilaterally are worrisome for interstitial edema of cardiac or noncardiac calls. One cannot exclude interstitial pneumonia in the appropriate clinical setting. A followup PA and lateral chest x-ray would be of value when the patient can tolerate the procedure.   Electronically Signed   By: David  [place]   On: 04/28/2013  11:39   Dg Chest Port 1v Same Day  05/04/2013   CLINICAL DATA:  Increased wheezing.  Shortness of breath.  EXAM: PORTABLE CHEST - 1 VIEW SAME DAY  COMPARISON:  Chest x-ray 04/28/2013.  FINDINGS: Previously noted min discretion previously noted mild interstitial pulmonary edema has resolved. No consolidative airspace disease. No pleural effusions. Heart size is within normal limits. The patient is rotated to the left on today's exam, resulting in distortion of the mediastinal contours and reduced diagnostic sensitivity and specificity for mediastinal pathology. Atherosclerosis in the thoracic aorta.  IMPRESSION: 1. Resolution of previously noted mild congestive heart failure. No new acute findings. 2. Atherosclerosis.   Electronically Signed   By: Trudie Reed M.D.   On: 05/04/2013 21:0  ASSESSMENT AND PLAN:     Acute CHF- presume secondary to AF and diastolic dysfunction. (Nl LVF 2010)     Cardiac status has been stable    CVA (cerebral infarction)       Carotid stenting is being done today by Dr. Allyson Sabal. I think it is likely that the patient can be discharged home tomorrow.   Willa Rough 05/09/2013 10:33 AM

## 2013-05-10 DIAGNOSIS — N183 Chronic kidney disease, stage 3 unspecified: Secondary | ICD-10-CM

## 2013-05-10 DIAGNOSIS — I2510 Atherosclerotic heart disease of native coronary artery without angina pectoris: Secondary | ICD-10-CM

## 2013-05-10 LAB — CBC
HEMATOCRIT: 31.7 % — AB (ref 39.0–52.0)
HEMOGLOBIN: 11 g/dL — AB (ref 13.0–17.0)
MCH: 29.7 pg (ref 26.0–34.0)
MCHC: 34.7 g/dL (ref 30.0–36.0)
MCV: 85.7 fL (ref 78.0–100.0)
Platelets: 213 10*3/uL (ref 150–400)
RBC: 3.7 MIL/uL — ABNORMAL LOW (ref 4.22–5.81)
RDW: 14.5 % (ref 11.5–15.5)
WBC: 8.3 10*3/uL (ref 4.0–10.5)

## 2013-05-10 LAB — BASIC METABOLIC PANEL
BUN: 11 mg/dL (ref 6–23)
CO2: 21 mEq/L (ref 19–32)
Calcium: 8.2 mg/dL — ABNORMAL LOW (ref 8.4–10.5)
Chloride: 101 mEq/L (ref 96–112)
Creatinine, Ser: 1.54 mg/dL — ABNORMAL HIGH (ref 0.50–1.35)
GFR calc non Af Amer: 40 mL/min — ABNORMAL LOW (ref >90)
GFR, EST AFRICAN AMERICAN: 46 mL/min — AB (ref >90)
GLUCOSE: 91 mg/dL (ref 70–99)
Potassium: 3.8 mEq/L (ref 3.7–5.3)
Sodium: 136 mEq/L — ABNORMAL LOW (ref 137–147)

## 2013-05-10 MED FILL — Sodium Chloride IV Soln 0.9%: INTRAVENOUS | Qty: 50 | Status: AC

## 2013-05-10 NOTE — Progress Notes (Signed)
Noted pt now in heart block Mobitz II, HR 35-50. MD paged and made aware. No new orders received. Will continue to monitor closely.

## 2013-05-10 NOTE — Progress Notes (Signed)
SUBJECTIVE:  Still requiring phenylephrine.  No dizziness.  Has had a headache.  OBJECTIVE:   Vitals:   Filed Vitals:   05/10/13 0730 05/10/13 0739 05/10/13 0800 05/10/13 0830  BP: 105/47  118/65 114/40  Pulse:      Temp:  97.5 F (36.4 C)    TempSrc:  Oral    Resp: 13  23 24   Height:      Weight:      SpO2:       I&O's:   Intake/Output Summary (Last 24 hours) at 05/10/13 1013 Last data filed at 05/10/13 0800  Gross per 24 hour  Intake 950.37 ml  Output    650 ml  Net 300.37 ml   TELEMETRY: Reviewed telemetry pt in NSR:     PHYSICAL EXAM General: Well developed, well nourished, in no acute distress Head: Eyes PERRLA, No xanthomas.   Normal cephalic and atramatic  Lungs:   Clear bilaterally to auscultation and percussion. Heart:   HRRR S1 S2 . No JVD.   Abdomen:  abdomen soft and non-tender  Msk:  Back normal,Normal strength and tone for age. Extremities:   No clubbing, cyanosis or edema.  DP right 3+; small right groin hematoma Neuro: Alert and oriented X 3. Psych:  Good affect, responds appropriately   LABS: Basic Metabolic Panel:  Recent Labs  57/32/20 0515 05/10/13 0435  NA 136* 136*  K 3.9 3.8  CL 100 101  CO2 21 21  GLUCOSE 103* 91  BUN 9 11  CREATININE 1.53* 1.54*  CALCIUM 8.9 8.2*   Liver Function Tests: No results found for this basename: AST, ALT, ALKPHOS, BILITOT, PROT, ALBUMIN,  in the last 72 hours No results found for this basename: LIPASE, AMYLASE,  in the last 72 hours CBC:  Recent Labs  05/09/13 0515 05/10/13 0435  WBC 6.9 8.3  HGB 12.1* 11.0*  HCT 35.0* 31.7*  MCV 85.0 85.7  PLT 196 213   Cardiac Enzymes: No results found for this basename: CKTOTAL, CKMB, CKMBINDEX, TROPONINI,  in the last 72 hours BNP: No components found with this basename: POCBNP,  D-Dimer: No results found for this basename: DDIMER,  in the last 72 hours Hemoglobin A1C: No results found for this basename: HGBA1C,  in the last 72 hours Fasting Lipid  Panel: No results found for this basename: CHOL, HDL, LDLCALC, TRIG, CHOLHDL, LDLDIRECT,  in the last 72 hours Thyroid Function Tests: No results found for this basename: TSH, T4TOTAL, FREET3, T3FREE, THYROIDAB,  in the last 72 hours Anemia Panel: No results found for this basename: VITAMINB12, FOLATE, FERRITIN, TIBC, IRON, RETICCTPCT,  in the last 72 hours Coag Panel:   Lab Results  Component Value Date   INR 0.93 04/28/2013   INR 1.1 06/13/2008    RADIOLOGY: Ct Head Wo Contrast  05/01/2013   CLINICAL DATA:  Of evaluate for potential left sided cerebral hemorrhage or stroke. Atrial fibrillation.  EXAM: CT HEAD WITHOUT CONTRAST  TECHNIQUE: Contiguous axial images were obtained from the base of the skull through the vertex without intravenous contrast.  COMPARISON:  Head CT 11/08/2012.  FINDINGS: Mild cerebral atrophy. Patchy and confluent areas of decreased attenuation are noted throughout the deep and periventricular white matter of the cerebral hemispheres bilaterally, compatible with chronic microvascular ischemic disease. No acute intracranial abnormalities. Specifically, no evidence of acute intracranial hemorrhage, no definite findings of acute/subacute cerebral ischemia, no mass, mass effect, hydrocephalus or abnormal intra or extra-axial fluid collections. Visualized paranasal sinuses and mastoids are well  pneumatized. No acute displaced skull fractures are identified.  IMPRESSION: 1. No acute intracranial abnormalities. 2. Mild cerebral atrophy. 3. Chronic microvascular ischemic changes in the cerebral white matter.   Electronically Signed   By: Trudie Reedaniel  Entrikin M.D.   On: 05/01/2013 21:20   Mr Maxine GlennMra Head Wo Contrast  05/03/2013   CLINICAL DATA:  Difficulty feeding himself which began 05/01/2013. Speech difficulty. Hypertension.  EXAM: MRI HEAD WITHOUT CONTRAST  MRA HEAD WITHOUT CONTRAST  TECHNIQUE: Multiplanar, multiecho pulse sequences of the brain and surrounding structures were obtained  without intravenous contrast. Angiographic images of the head were obtained using MRA technique without contrast.  COMPARISON:  05/01/2013 head CT.  No comparison MR.  FINDINGS: MRI HEAD FINDINGS  Exam is motion degraded.  Scattered small acute nonhemorrhagic infarcts including:  Left frontal -parietal region in a slightly parasagittal distribution.  Superior left peri operculum region.  Posterior limb left internal capsule.  Posterior left temporal-occipital lobe.  Right parietal lobe.  Possibly right cerebellum.  The fact that multiple vascular distributions are involved raises the possibility of embolic disease. Watershed type infarct as a cause of the parasagittal left convexity infarcts is a possibility but would not explain surrounding small infarcts.  Remote small infarcts left parietal and right frontal lobe. Remote left thalamic infarcts.  Mild to moderate small vessel disease type changes.  No intracranial hemorrhage.  Atrophy without hydrocephalus.  No intracranial mass lesion noted on this unenhanced exam.  Partially empty sella incidentally noted. Cervical medullary junction, pituitary region and orbital structures unremarkable.  Minimal paranasal sinus mucosal thickening.  MRA HEAD FINDINGS  Moderate to marked narrowing M1 segment right middle cerebral artery extending into the right middle cerebral artery bifurcation. Decrease number of visualized right middle cerebral artery branches.  Mild to moderate narrowing A1 -A2 aspect of the left anterior cerebral artery.  Left middle cerebral artery mild branch vessel irregularity.  Left vertebral artery is dominant. Areas of mild to moderate narrowing involving portions of the right vertebral artery.  Poor delineation of majority of the right posterior inferior cerebellar artery. Non visualization right posterior inferior cerebellar artery.  Mild to moderate narrowing proximal to mid basilar artery.  Poor delineation anterior inferior cerebellar artery.   Moderate narrowing superior cerebellar artery bilaterally.  Moderate tandem stenosis right posterior cerebral artery. Mild narrowing and irregularity left posterior cerebral artery.  No aneurysm detected.  IMPRESSION: Several small acute nonhemorrhagic infarcts in different vascular distributions as detailed above raising the possibility of embolic disease.  Intracranial atherosclerotic type changes as detailed above.  These results will be called to the ordering clinician or representative by the Radiologist Assistant, and communication documented in the PACS Dashboard.   Electronically Signed   By: Bridgett LarssonSteve  Olson M.D.   On: 05/03/2013 16:17   Mr Brain Wo Contrast  05/03/2013   CLINICAL DATA:  Difficulty feeding himself which began 05/01/2013. Speech difficulty. Hypertension.  EXAM: MRI HEAD WITHOUT CONTRAST  MRA HEAD WITHOUT CONTRAST  TECHNIQUE: Multiplanar, multiecho pulse sequences of the brain and surrounding structures were obtained without intravenous contrast. Angiographic images of the head were obtained using MRA technique without contrast.  COMPARISON:  05/01/2013 head CT.  No comparison MR.  FINDINGS: MRI HEAD FINDINGS  Exam is motion degraded.  Scattered small acute nonhemorrhagic infarcts including:  Left frontal -parietal region in a slightly parasagittal distribution.  Superior left peri operculum region.  Posterior limb left internal capsule.  Posterior left temporal-occipital lobe.  Right parietal lobe.  Possibly right cerebellum.  The fact that multiple vascular distributions are involved raises the possibility of embolic disease. Watershed type infarct as a cause of the parasagittal left convexity infarcts is a possibility but would not explain surrounding small infarcts.  Remote small infarcts left parietal and right frontal lobe. Remote left thalamic infarcts.  Mild to moderate small vessel disease type changes.  No intracranial hemorrhage.  Atrophy without hydrocephalus.  No intracranial mass  lesion noted on this unenhanced exam.  Partially empty sella incidentally noted. Cervical medullary junction, pituitary region and orbital structures unremarkable.  Minimal paranasal sinus mucosal thickening.  MRA HEAD FINDINGS  Moderate to marked narrowing M1 segment right middle cerebral artery extending into the right middle cerebral artery bifurcation. Decrease number of visualized right middle cerebral artery branches.  Mild to moderate narrowing A1 -A2 aspect of the left anterior cerebral artery.  Left middle cerebral artery mild branch vessel irregularity.  Left vertebral artery is dominant. Areas of mild to moderate narrowing involving portions of the right vertebral artery.  Poor delineation of majority of the right posterior inferior cerebellar artery. Non visualization right posterior inferior cerebellar artery.  Mild to moderate narrowing proximal to mid basilar artery.  Poor delineation anterior inferior cerebellar artery.  Moderate narrowing superior cerebellar artery bilaterally.  Moderate tandem stenosis right posterior cerebral artery. Mild narrowing and irregularity left posterior cerebral artery.  No aneurysm detected.  IMPRESSION: Several small acute nonhemorrhagic infarcts in different vascular distributions as detailed above raising the possibility of embolic disease.  Intracranial atherosclerotic type changes as detailed above.  These results will be called to the ordering clinician or representative by the Radiologist Assistant, and communication documented in the PACS Dashboard.   Electronically Signed   By: Bridgett Larsson M.D.   On: 05/03/2013 16:17   Dg Chest Portable 1 View  04/28/2013   CLINICAL DATA:  Shortness of breath.  EXAM: PORTABLE CHEST - 1 VIEW  COMPARISON:  DG CHEST 2 VIEW dated 11/08/2012  FINDINGS: The lungs are adequately inflated. The interstitial markings are increased diffusely. The cardiopericardial silhouette is enlarged. Its borders are somewhat indistinct. The central  pulmonary vascularity is prominent but definite cephalization is not demonstrated. There is no pleural effusion or pneumothorax. The mediastinum is normal in width. There is calcification in the wall of the aortic arch. The observed portions of the bony structures exhibit no acute abnormalities.  IMPRESSION: Increased interstitial markings bilaterally are worrisome for interstitial edema of cardiac or noncardiac calls. One cannot exclude interstitial pneumonia in the appropriate clinical setting. A followup PA and lateral chest x-ray would be of value when the patient can tolerate the procedure.   Electronically Signed   By: David  [place]   On: 04/28/2013 11:39   Dg Chest Port 1v Same Day  05/04/2013   CLINICAL DATA:  Increased wheezing.  Shortness of breath.  EXAM: PORTABLE CHEST - 1 VIEW SAME DAY  COMPARISON:  Chest x-ray 04/28/2013.  FINDINGS: Previously noted min discretion previously noted mild interstitial pulmonary edema has resolved. No consolidative airspace disease. No pleural effusions. Heart size is within normal limits. The patient is rotated to the left on today's exam, resulting in distortion of the mediastinal contours and reduced diagnostic sensitivity and specificity for mediastinal pathology. Atherosclerosis in the thoracic aorta.  IMPRESSION: 1. Resolution of previously noted mild congestive heart failure. No new acute findings. 2. Atherosclerosis.   Electronically Signed   By: Trudie Reed M.D.   On: 05/04/2013 21:00      ASSESSMENT/PLAN:  S/p carotid stent.  Still requiring low dose pressors.  Will mobilize.  D/w Dr. Pearlean Brownie.  Hypotension Likely related to baroreceptor trauma.  Will have him sit for a while.  Gradula changes in position should help BP.  COntinue DAPT for now.  In 3 months, would consider stopping plavix and using NOAC, per Dr. Pearlean Brownie.  Cardiomyopathy: Resume beta blocker when BP tolerates.  He was on an ACE-I in the past.  Will see how his renal function is doing  tomorrow.    CAD: No active ischemia.  Corky Crafts., MD  05/10/2013  10:13 AM

## 2013-05-10 NOTE — Progress Notes (Signed)
OT Cancellation Note  Patient Details Name: [name] MRN: [id number] DOB: 01-04-1927   Cancelled Treatment:    Reason Eval/Treat Not Completed: Medical issues which prohibited therapy. Pt still having HR and BP issues this morning. Spoke with nursing and she recommends we wait until MDs round on pt before attempting therapy. Will check back with her later today.  Evette Georges [phone] 05/10/2013, 8:37 AM

## 2013-05-10 NOTE — Progress Notes (Signed)
Pt on monitor in A fib with HR now in the 48-50s (lower than previously recorded all day, HR 70-80s). Family at bedside and concerned about low HR. Pt also requiring titration of Neo drip due to low BP. Oncall MD paged and made aware. No new orders received. Will continue to monitor closely.

## 2013-05-10 NOTE — Progress Notes (Signed)
Physical Therapy Treatment Patient Details Name: [name] MRN: [id number] DOB: May 24, 1926 Today's Date: 05/10/2013    History of Present Illness Pt is an 80 yo male that does not speak English admitted initially for afib and CHF.  While in the hospital pt  began to have R side weakness and slurred speech on 3/22.Pt with L parietal & thalamic and right frontal remote infarcts. s/p carotid stent 3/30.     PT Comments    Pt currently s/p carotid stent with hypotension earlier requiring neosynephrine and neurology notes for very gradual progression bed to chair over hours. Activity limited due to above. Pt with slow transfer supine to sit although pt requires max cueing due to eager to get up and practically jumping OOB. Pt sat EOB 15 min without hypotension performing bil LE HEP then pivoted back to seated position in bed with bed in full chair position with instruction for hopeful transfer to chair later with RN if all vitals remain stable seated. Will continue to follow.   Follow Up Recommendations  Home health PT;Supervision/Assistance - 24 hour     Equipment Recommendations       Recommendations for Other Services       Precautions / Restrictions Precautions Precautions: Fall    Mobility  Bed Mobility Overal bed mobility: Needs Assistance       Supine to sit: Supervision     General bed mobility comments: supervision with cues for decreased speed with current restrictions for mobility s/p stent  Transfers Overall transfer level:  (unable due to current mobility restrictions)                  Ambulation/Gait                 Stairs            Wheelchair Mobility    Modified Rankin (Stroke Patients Only)       Balance                                    Cognition Arousal/Alertness: Awake/alert                          Exercises General Exercises - Lower Extremity Long Arc Quad: AROM;Seated;Both;20  reps Hip Flexion/Marching: AROM;Seated;Both;20 reps Toe Raises: AROM;Seated;Both;20 reps Heel Raises: AROM;Seated;Both;20 reps    General Comments        Pertinent Vitals/Pain No pain, dizziness or headache HR 69-70 BP 120/70 (86) supine 116/47 (72) seated EOB 128/62 (83) seated after 15 min EOB    Home Living                      Prior Function            PT Goals (current goals can now be found in the care plan section) Progress towards PT goals: Not progressing toward goals - comment (due to current MD activity restrictions imposed s/p stent)    Frequency       PT Plan Current plan remains appropriate    End of Session   Activity Tolerance: Patient tolerated treatment well Patient left: in bed;with call bell/phone within reach;with family/visitor present (in full chair position)     Time: 5701-7793 PT Time Calculation (min): 26 min  Charges:  $Therapeutic Exercise: 8-22 mins $Therapeutic Activity: 8-22 mins  G CodesDelorse Lek:      Tabor, Ilir Mahrt Beth 05/10/2013, 2:00 PM Delaney MeigsMaija Tabor Jakayden Cancio, PT [phone]

## 2013-05-10 NOTE — Progress Notes (Signed)
Stroke Team Progress Note  HISTORY [name] is an 80 y.o. male with a history of heart failure, hypertension and abdominal aortic aneurysm was admitted on 04/28/2013 for management of atrial fibrillation and heart failure. Patient is currently on heparin drip for anticoagulation. At noon on 05/01/2013 his daughter noticed that he had difficulty feeding himself and she was unable to understand his speech. There is no previous history of stroke. NIH stroke score was 5. CT scan of his head showed no acute intracranial abnormality including no signs of acute infarction or acute stroke.  tPA was not considered as pt was anticoagulated on IV heparin.   SUBJECTIVE Multiple family members at bedside. Dr. Eldridge Robinson and nursing care team also present.  OBJECTIVE Most recent Vital Signs: Filed Vitals:   05/10/13 0730 05/10/13 0739 05/10/13 0800 05/10/13 0830  BP: 105/47  118/65 114/40  Pulse:      Temp:  97.5 F (36.4 C)    TempSrc:  Oral    Resp: 13  23 24   Height:      Weight:      SpO2:       CBG (last 3)  No results found for this basename: GLUCAP,  in the last 72 hours  IV Fluid Intake:   . phenylephrine (NEO-SYNEPHRINE) Adult infusion 20 mcg/min (05/10/13 0545)    MEDICATIONS  . allopurinol  200 mg Oral Daily  . aspirin EC  325 mg Oral Daily  . atorvastatin  40 mg Oral q1800  . carvedilol  3.125 mg Oral BID WC  . clopidogrel  75 mg Oral Q breakfast  . colchicine  0.3 mg Oral Daily  . dicyclomine  20 mg Oral QID  . pantoprazole  40 mg Oral Daily   PRN:  sodium chloride, acetaminophen, alum & mag hydroxide-simeth, guaiFENesin, influenza vac split quadrivalent PF, nitroGLYCERIN, ondansetron (ZOFRAN) IV, zolpidem  Diet:  Cardiac thin liquids Activity:  OOB with assistance DVT Prophylaxis:  IV heparin  CLINICALLY SIGNIFICANT STUDIES Basic Metabolic Panel:   Recent Labs Lab 05/09/13 0515 05/10/13 0435  NA 136* 136*  K 3.9 3.8  CL 100 101  CO2 21 21  GLUCOSE 103* 91   BUN 9 11  CREATININE 1.53* 1.54*  CALCIUM 8.9 8.2*   Liver Function Tests:  No results found for this basename: AST, ALT, ALKPHOS, BILITOT, PROT, ALBUMIN,  in the last 168 hours CBC:   Recent Labs Lab 05/09/13 0515 05/10/13 0435  WBC 6.9 8.3  HGB 12.1* 11.0*  HCT 35.0* 31.7*  MCV 85.0 85.7  PLT 196 213   Coagulation:  No results found for this basename: LABPROT, INR,  in the last 168 hours Cardiac Enzymes:  No results found for this basename: CKTOTAL, CKMB, CKMBINDEX, TROPONINI,  in the last 168 hours Urinalysis: No results found for this basename: COLORURINE, APPERANCEUR, LABSPEC, PHURINE, GLUCOSEU, HGBUR, BILIRUBINUR, KETONESUR, PROTEINUR, UROBILINOGEN, NITRITE, LEUKOCYTESUR,  in the last 168 hours Lipid Panel    Component Value Date/Time   CHOL 206* 04/29/2013 0326   TRIG 114 04/29/2013 0326   HDL 39* 04/29/2013 0326   CHOLHDL 5.3 04/29/2013 0326   VLDL 23 04/29/2013 0326   LDLCALC 144* 04/29/2013 0326   HgbA1C  Lab Results  Component Value Date   HGBA1C 6.0* 05/03/2013    Urine Drug Screen:   No results found for this basename: labopia,  cocainscrnur,  labbenz,  amphetmu,  thcu,  labbarb    Alcohol Level: No results found for this basename: ETH,  in the  last 168 hours   CT of the brain  05/01/2013    1. No acute intracranial abnormalities. 2. Mild cerebral atrophy. 3. Chronic microvascular ischemic changes in the cerebral white matter.     MRI of the brain  05/02/2013 Several small acute nonhemorrhagic infarcts in different vascular distributions as detailed above raising the possibility of embolic disease.   MRA of the brain  05/02/2013 Intracranial atherosclerotic type changes   2D Echocardiogram  Diffuse hypokineisis worse in the inferior wall and apex The cavity size was mildly dilated. Wall thickness was normal. The estimated ejection fraction was 35%. Moderate MV regurgitation.  Carotid Doppler  RICA velocities 1-39% and the LICA velocities are 80-99%.  Vertebral artery flow is patent and flow is antegrade, bilaterally.   CXR  05/04/2013 1. Resolution of previously noted mild congestive heart failure. No new acute findings. 2. Atherosclerosis.  EKG  atrial fibrillation. For complete results please see formal report.   Therapy Recommendations HH PT, OT  Physical Exam   Frail elderly sian male not in distress.Awake alert. Afebrile. Head is nontraumatic. Neck is supple without bruit.  . Cardiac exam no murmur or gallop. Lungs are clear to auscultation. Distal pulses are well felt. Neurological Exam : Highly limited as patient and family members in the room but speaking limited English. He is awake alert able to speak in his language fluently. Extraocular movements are full range without nystagmus. Face is symmetric. Tongue is midline. Mild left hemiparesis  but able to hold upper  and lower extremity up against gravity. Sensation and coordination cannot be reliably tested. Gait was not tested.  ASSESSMENT [name] is a 80 y.o. male initially admitted for atrial fibrillation and heart failure, who then developed new-onset slurred speech and right-sided weakness in the hospital. MRI confirmed bilateral, multiple vascular territory infarcts felt to be embolic secondary to known atrial fibrillation and/or L ICA stenosis > 80%. Carotid stent to be considered. On aspirin 81 mg orally every day and heparin at the time of consult. Now on aspirin 325 mg and Plavix for secondary stroke prevention.   L ICA stenosis > 80%, carotid stent placed by Dr. Allyson Robinson 05/09/13  Bradycardia during the night, especially on the Left side. Likely sensitive baroreceptors post stent placement.   Hypertension atrial fibrillation NSTEMI - cath with diseast - medical management only Acute CHF, felt to be secondary to atrial fibrillation and diastolic dysfunction Chronic renal insufficiency, stage III (moderate) PVD  Hyperlipidemia, LDL 144, on No statin PTA, now  on lipitor 40 mg daily, goal LDL < 100   Hospital day # 12  TREATMENT/PLAN  Continue aspirin 325 mg, and Plavix for secondary stroke (aspirin 325 given stent and afib). No plans for oral anticoagulation at this time due to risk of bleeding on dual antiplatelets.  Gradual slow progression from bed to bedside to chair (hours, not minutes). Watch for neuro changes. Typically post carotid intervention hypotension is vaguely mediated and may take days or weeks to resolve and is usually not accompanied by neurological worsening  Likley MAP goal 60 - looks great at 60  Anticipate Home health PT and OT. They will re-eval.  Annie MainSHARON BIBY, MSN, RN, ANVP-BC, ANP-BC, GNP-BC [facility] Pager: [phone] 05/10/2013 10:06 AM  I have personally obtained a history, examined the patient, evaluated imaging results, and formulated the assessment and plan of care. I agree with the above.  Delia HeadyPramod Sethi, MD  To contact Stroke Continuity provider, please refer to [email or web address]. After  hours, contact General Neurology

## 2013-05-10 NOTE — Care Management Note (Addendum)
    Page 1 of 2   05/11/2013     1:38:40 PM   CARE MANAGEMENT NOTE 05/11/2013  Patient:  [name], [name]   Account Number:  [id number]  Date Initiated:  05/03/2013  Documentation initiated by:  MAYO,HENRIETTA  Subjective/Objective Assessment:   dx AFib/diastolic failure; lives with family    PCP  Dr Benedetto Goad     Action/Plan:   Anticipated DC Date:  05/11/2013   Anticipated DC Plan:  HOME W HOME HEALTH SERVICES      DC Planning Services  CM consult      [facility]  HOME HEALTH   Choice offered to / List presented to:  C-4 Adult Children        HH arranged  HH-1 RN  HH-2 PT  HH-3 OT      [facility] agency  [facility]   Status of service:   Medicare Important Message given?   (If response is "NO", the following Medicare IM given date fields will be blank) Date Medicare IM given:   Date Additional Medicare IM given:    Discharge Disposition:  HOME W HOME HEALTH SERVICES  Per UR Regulation:  Reviewed for med. necessity/level of care/duration of stay  If discussed at Long Length of Stay Meetings, dates discussed:   05/10/2013  05/12/2013    Comments:  Contact: Gloris Manchester Daughter [phone]   Dairo, Hemric Spouse [phone]  4/1 1337 debbie Kanyia Heaslip rn,bsn [facility] aware of pt's disch home today. they will call pt to set up home visit.  3/31 1121 debbie Bronte Kropf rn,bsn spoke w wife-son-da. gave them hhc agency list. no pref. ref to [facility] for [facility].  05/04/13 1442 Henrietta Mayo RN MSN BSN CCM Note PT/OT recommend home therapy.  Pt currently in cath lab.

## 2013-05-11 LAB — BASIC METABOLIC PANEL
BUN: 9 mg/dL (ref 6–23)
CHLORIDE: 101 meq/L (ref 96–112)
CO2: 19 mEq/L (ref 19–32)
Calcium: 8.5 mg/dL (ref 8.4–10.5)
Creatinine, Ser: 1.42 mg/dL — ABNORMAL HIGH (ref 0.50–1.35)
GFR calc Af Amer: 51 mL/min — ABNORMAL LOW (ref >90)
GFR, EST NON AFRICAN AMERICAN: 44 mL/min — AB (ref >90)
Glucose, Bld: 89 mg/dL (ref 70–99)
POTASSIUM: 3.6 meq/L — AB (ref 3.7–5.3)
Sodium: 137 mEq/L (ref 137–147)

## 2013-05-11 MED ORDER — PANTOPRAZOLE SODIUM 40 MG PO TBEC: 40.0000 mg | DELAYED_RELEASE_TABLET | Freq: Every day | ORAL | Status: DC

## 2013-05-11 MED ORDER — CARVEDILOL 3.125 MG PO TABS: 3.1250 mg | ORAL_TABLET | Freq: Two times a day (BID) | ORAL | Status: DC

## 2013-05-11 MED ORDER — ATORVASTATIN CALCIUM 40 MG PO TABS: 40.0000 mg | ORAL_TABLET | Freq: Every day | ORAL | Status: DC

## 2013-05-11 MED ORDER — NITROGLYCERIN 0.4 MG SL SUBL: 0.4000 mg | SUBLINGUAL_TABLET | SUBLINGUAL | Status: DC | PRN

## 2013-05-11 MED ORDER — CLOPIDOGREL BISULFATE 75 MG PO TABS: 75.0000 mg | ORAL_TABLET | Freq: Every day | ORAL | Status: DC

## 2013-05-11 MED ORDER — ASPIRIN 325 MG PO TBEC: 325.0000 mg | DELAYED_RELEASE_TABLET | Freq: Every day | ORAL | Status: DC

## 2013-05-11 NOTE — Progress Notes (Signed)
Stroke Team Progress Note  HISTORY [name] is an 80 y.o. male with a history of heart failure, hypertension and abdominal aortic aneurysm was admitted on 04/28/2013 for management of atrial fibrillation and heart failure. Patient is currently on heparin drip for anticoagulation. At noon on 05/01/2013 his daughter noticed that he had difficulty feeding himself and she was unable to understand his speech. There is no previous history of stroke. NIH stroke score was 5. CT scan of his head showed no acute intracranial abnormality including no signs of acute infarction or acute stroke.  tPA was not considered as pt was anticoagulated on IV heparin.   SUBJECTIVE Multiple family members are at the bedside.  OBJECTIVE Most recent Vital Signs: Filed Vitals:   05/11/13 0500 05/11/13 0600 05/11/13 0700 05/11/13 0800  BP: 105/54 111/60 119/52 107/49  Pulse:    70  Temp:    98.9 F (37.2 C)  TempSrc:    Oral  Resp: 18 15 20 22   Height:      Weight: 65.5 kg (144 lb 6.4 oz)     SpO2:  96% 94% 97%   CBG (last 3)  No results found for this basename: GLUCAP,  in the last 72 hours  IV Fluid Intake:   . phenylephrine (NEO-SYNEPHRINE) Adult infusion Stopped (05/11/13 0800)    MEDICATIONS  . allopurinol  200 mg Oral Daily  . aspirin EC  325 mg Oral Daily  . atorvastatin  40 mg Oral q1800  . carvedilol  3.125 mg Oral BID WC  . clopidogrel  75 mg Oral Q breakfast  . colchicine  0.3 mg Oral Daily  . dicyclomine  20 mg Oral QID  . pantoprazole  40 mg Oral Daily   PRN:  sodium chloride, acetaminophen, alum & mag hydroxide-simeth, guaiFENesin, influenza vac split quadrivalent PF, nitroGLYCERIN, ondansetron (ZOFRAN) IV, zolpidem  Diet:  Cardiac thin liquids Activity:  OOB with assistance DVT Prophylaxis:  IV heparin  CLINICALLY SIGNIFICANT STUDIES Basic Metabolic Panel:   Recent Labs Lab 05/10/13 0435 05/11/13 0227  NA 136* 137  K 3.8 3.6*  CL 101 101  CO2 21 19  GLUCOSE 91 89   BUN 11 9  CREATININE 1.54* 1.42*  CALCIUM 8.2* 8.5   Liver Function Tests:  No results found for this basename: AST, ALT, ALKPHOS, BILITOT, PROT, ALBUMIN,  in the last 168 hours CBC:   Recent Labs Lab 05/09/13 0515 05/10/13 0435  WBC 6.9 8.3  HGB 12.1* 11.0*  HCT 35.0* 31.7*  MCV 85.0 85.7  PLT 196 213   Coagulation:  No results found for this basename: LABPROT, INR,  in the last 168 hours Cardiac Enzymes:  No results found for this basename: CKTOTAL, CKMB, CKMBINDEX, TROPONINI,  in the last 168 hours Urinalysis: No results found for this basename: COLORURINE, APPERANCEUR, LABSPEC, PHURINE, GLUCOSEU, HGBUR, BILIRUBINUR, KETONESUR, PROTEINUR, UROBILINOGEN, NITRITE, LEUKOCYTESUR,  in the last 168 hours Lipid Panel    Component Value Date/Time   CHOL 206* 04/29/2013 0326   TRIG 114 04/29/2013 0326   HDL 39* 04/29/2013 0326   CHOLHDL 5.3 04/29/2013 0326   VLDL 23 04/29/2013 0326   LDLCALC 144* 04/29/2013 0326   HgbA1C  Lab Results  Component Value Date   HGBA1C 6.0* 05/03/2013    Urine Drug Screen:   No results found for this basename: labopia,  cocainscrnur,  labbenz,  amphetmu,  thcu,  labbarb    Alcohol Level: No results found for this basename: ETH,  in the last 168  hours   CT of the brain  05/01/2013    1. No acute intracranial abnormalities. 2. Mild cerebral atrophy. 3. Chronic microvascular ischemic changes in the cerebral white matter.     MRI of the brain  05/02/2013 Several small acute nonhemorrhagic infarcts in different vascular distributions as detailed above raising the possibility of embolic disease.   MRA of the brain  05/02/2013 Intracranial atherosclerotic type changes   2D Echocardiogram  Diffuse hypokineisis worse in the inferior wall and apex The cavity size was mildly dilated. Wall thickness was normal. The estimated ejection fraction was 35%. Moderate MV regurgitation.  Carotid Doppler  RICA velocities 1-39% and the LICA velocities are 80-99%.  Vertebral artery flow is patent and flow is antegrade, bilaterally.   CXR  05/04/2013 1. Resolution of previously noted mild congestive heart failure. No new acute findings. 2. Atherosclerosis.  EKG  atrial fibrillation. For complete results please see formal report.   Therapy Recommendations HH PT, OT  Physical Exam   Frail elderly sian male not in distress.Awake alert. Afebrile. Head is nontraumatic. Neck is supple without bruit.  . Cardiac exam no murmur or gallop. Lungs are clear to auscultation. Distal pulses are well felt. Neurological Exam : Highly limited as patient and family members in the room but speaking limited English. He is awake alert able to speak in his language fluently. Extraocular movements are full range without nystagmus. Face is symmetric. Tongue is midline. Mild left hemiparesis  but able to hold upper  and lower extremity up against gravity. Sensation and coordination cannot be reliably tested. Gait was not tested.  ASSESSMENT [name] is a 80 y.o. male initially admitted for atrial fibrillation and heart failure, who then developed new-onset slurred speech and right-sided weakness in the hospital. MRI confirmed bilateral, multiple vascular territory infarcts felt to be embolic secondary to known atrial fibrillation and/or L ICA stenosis > 80%. Carotid stent to be considered. On aspirin 81 mg orally every day and heparin at the time of consult. Now on aspirin 325 mg and Plavix for secondary stroke prevention.      L ICA stenosis > 80%, carotid stent placed by Dr. Allyson SabalBerry 05/09/13  Bradycardia during the night, especially on the Left side. Likely sensitive baroreceptors post stent placement.  Patient now off pressors. BP has been ok. Literature review shows post carotid stenting hypotension due to exaggerated vagal response occurs in 10-15% of patients, highest risk is advanced age and severe stenosis (this patient has both). Overall, improved  today.  Hypertension atrial fibrillation NSTEMI - cath with diseast - medical management only Acute CHF, felt to be secondary to atrial fibrillation and diastolic dysfunction Chronic renal insufficiency, stage III (moderate) PVD  Hyperlipidemia, LDL 144, on No statin PTA, now on lipitor 40 mg daily, goal LDL < 100   Hospital day # 13  TREATMENT/PLAN  Continue aspirin 325 mg, and Plavix for secondary stroke (aspirin 325 given stent and afib) x 3 months then plan to change to aspirin 81 mg daily and eliquis. No plans for oral anticoagulation at discharge due to risk of bleeding on it an addition to  dual antiplatelets.  Reassessment confirms Home health PT and OT.   No further stroke workup indicated.  Patient has a 10-15% risk of having another stroke over the next year, the highest risk is within 2 weeks of the most recent stroke/TIA (risk of having a stroke following a stroke or TIA is the same).  Ongoing risk factor control by  Primary Care Physician  Stroke Service will sign off. Please call should any needs arise.  Follow up with Dr. Pearlean Brownie, Stroke Clinic, in 2 months.   Annie Main, MSN, RN, ANVP-BC, ANP-BC, [facility] Pager: [phone] 05/11/2013 9:31 AM  I have personally obtained a history, examined the patient, evaluated imaging results, and formulated the assessment and plan of care. I agree with the above. Delia Heady, MD  To contact Stroke Continuity provider, please refer to [email or web address]. After hours, contact General Neurology

## 2013-05-11 NOTE — Progress Notes (Signed)
SUBJECTIVE:  Off phenylephrine.  No dizziness.  OK for d/c per neuro.  OBJECTIVE:   Vitals:   Filed Vitals:   05/11/13 0500 05/11/13 0600 05/11/13 0700 05/11/13 0800  BP: 105/54 111/60 119/52 107/49  Pulse:    70  Temp:    98.9 F (37.2 C)  TempSrc:    Oral  Resp: 18 15 20 22   Height:      Weight: 144 lb 6.4 oz (65.5 kg)     SpO2:  96% 94% 97%   I&O's:    Intake/Output Summary (Last 24 hours) at 05/11/13 1125 Last data filed at 05/11/13 0800  Gross per 24 hour  Intake  676.4 ml  Output    975 ml  Net -298.6 ml   TELEMETRY: Reviewed telemetry pt in NSR:     PHYSICAL EXAM General: Well developed, well nourished, in no acute distress Head: Eyes PERRLA, No xanthomas.   Normal cephalic and atramatic  Lungs:   Clear bilaterally to auscultation and percussion. Heart:   HRRR S1 S2 . No JVD.   Abdomen:  abdomen soft and non-tender  Msk:  Back normal,Normal strength and tone for age. Extremities:   No clubbing, cyanosis or edema.  DP right 3+; small right groin hematoma Neuro: Alert and oriented X 3. Psych:  Good affect, responds appropriately   LABS: Basic Metabolic Panel:  Recent Labs  16/10/96 0435 05/11/13 0227  NA 136* 137  K 3.8 3.6*  CL 101 101  CO2 21 19  GLUCOSE 91 89  BUN 11 9  CREATININE 1.54* 1.42*  CALCIUM 8.2* 8.5   Liver Function Tests: No results found for this basename: AST, ALT, ALKPHOS, BILITOT, PROT, ALBUMIN,  in the last 72 hours No results found for this basename: LIPASE, AMYLASE,  in the last 72 hours CBC:  Recent Labs  05/09/13 0515 05/10/13 0435  WBC 6.9 8.3  HGB 12.1* 11.0*  HCT 35.0* 31.7*  MCV 85.0 85.7  PLT 196 213   Cardiac Enzymes: No results found for this basename: CKTOTAL, CKMB, CKMBINDEX, TROPONINI,  in the last 72 hours BNP: No components found with this basename: POCBNP,  D-Dimer: No results found for this basename: DDIMER,  in the last 72 hours Hemoglobin A1C: No results found for this basename: HGBA1C,  in  the last 72 hours Fasting Lipid Panel: No results found for this basename: CHOL, HDL, LDLCALC, TRIG, CHOLHDL, LDLDIRECT,  in the last 72 hours Thyroid Function Tests: No results found for this basename: TSH, T4TOTAL, FREET3, T3FREE, THYROIDAB,  in the last 72 hours Anemia Panel: No results found for this basename: VITAMINB12, FOLATE, FERRITIN, TIBC, IRON, RETICCTPCT,  in the last 72 hours Coag Panel:   Lab Results  Component Value Date   INR 0.93 04/28/2013   INR 1.1 06/13/2008    RADIOLOGY: Ct Head Wo Contrast  05/01/2013   CLINICAL DATA:  Of evaluate for potential left sided cerebral hemorrhage or stroke. Atrial fibrillation.  EXAM: CT HEAD WITHOUT CONTRAST  TECHNIQUE: Contiguous axial images were obtained from the base of the skull through the vertex without intravenous contrast.  COMPARISON:  Head CT 11/08/2012.  FINDINGS: Mild cerebral atrophy. Patchy and confluent areas of decreased attenuation are noted throughout the deep and periventricular white matter of the cerebral hemispheres bilaterally, compatible with chronic microvascular ischemic disease. No acute intracranial abnormalities. Specifically, no evidence of acute intracranial hemorrhage, no definite findings of acute/subacute cerebral ischemia, no mass, mass effect, hydrocephalus or abnormal intra or extra-axial fluid collections.  Visualized paranasal sinuses and mastoids are well pneumatized. No acute displaced skull fractures are identified.  IMPRESSION: 1. No acute intracranial abnormalities. 2. Mild cerebral atrophy. 3. Chronic microvascular ischemic changes in the cerebral white matter.   Electronically Signed   By: Trudie Reed M.D.   On: 05/01/2013 21:20   Mr Maxine Glenn Head Wo Contrast  05/03/2013   CLINICAL DATA:  Difficulty feeding himself which began 05/01/2013. Speech difficulty. Hypertension.  EXAM: MRI HEAD WITHOUT CONTRAST  MRA HEAD WITHOUT CONTRAST  TECHNIQUE: Multiplanar, multiecho pulse sequences of the brain and  surrounding structures were obtained without intravenous contrast. Angiographic images of the head were obtained using MRA technique without contrast.  COMPARISON:  05/01/2013 head CT.  No comparison MR.  FINDINGS: MRI HEAD FINDINGS  Exam is motion degraded.  Scattered small acute nonhemorrhagic infarcts including:  Left frontal -parietal region in a slightly parasagittal distribution.  Superior left peri operculum region.  Posterior limb left internal capsule.  Posterior left temporal-occipital lobe.  Right parietal lobe.  Possibly right cerebellum.  The fact that multiple vascular distributions are involved raises the possibility of embolic disease. Watershed type infarct as a cause of the parasagittal left convexity infarcts is a possibility but would not explain surrounding small infarcts.  Remote small infarcts left parietal and right frontal lobe. Remote left thalamic infarcts.  Mild to moderate small vessel disease type changes.  No intracranial hemorrhage.  Atrophy without hydrocephalus.  No intracranial mass lesion noted on this unenhanced exam.  Partially empty sella incidentally noted. Cervical medullary junction, pituitary region and orbital structures unremarkable.  Minimal paranasal sinus mucosal thickening.  MRA HEAD FINDINGS  Moderate to marked narrowing M1 segment right middle cerebral artery extending into the right middle cerebral artery bifurcation. Decrease number of visualized right middle cerebral artery branches.  Mild to moderate narrowing A1 -A2 aspect of the left anterior cerebral artery.  Left middle cerebral artery mild branch vessel irregularity.  Left vertebral artery is dominant. Areas of mild to moderate narrowing involving portions of the right vertebral artery.  Poor delineation of majority of the right posterior inferior cerebellar artery. Non visualization right posterior inferior cerebellar artery.  Mild to moderate narrowing proximal to mid basilar artery.  Poor delineation  anterior inferior cerebellar artery.  Moderate narrowing superior cerebellar artery bilaterally.  Moderate tandem stenosis right posterior cerebral artery. Mild narrowing and irregularity left posterior cerebral artery.  No aneurysm detected.  IMPRESSION: Several small acute nonhemorrhagic infarcts in different vascular distributions as detailed above raising the possibility of embolic disease.  Intracranial atherosclerotic type changes as detailed above.  These results will be called to the ordering clinician or representative by the Radiologist Assistant, and communication documented in the PACS Dashboard.   Electronically Signed   By: Bridgett Larsson M.D.   On: 05/03/2013 16:17   Mr Brain Wo Contrast  05/03/2013   CLINICAL DATA:  Difficulty feeding himself which began 05/01/2013. Speech difficulty. Hypertension.  EXAM: MRI HEAD WITHOUT CONTRAST  MRA HEAD WITHOUT CONTRAST  TECHNIQUE: Multiplanar, multiecho pulse sequences of the brain and surrounding structures were obtained without intravenous contrast. Angiographic images of the head were obtained using MRA technique without contrast.  COMPARISON:  05/01/2013 head CT.  No comparison MR.  FINDINGS: MRI HEAD FINDINGS  Exam is motion degraded.  Scattered small acute nonhemorrhagic infarcts including:  Left frontal -parietal region in a slightly parasagittal distribution.  Superior left peri operculum region.  Posterior limb left internal capsule.  Posterior left temporal-occipital lobe.  Right  parietal lobe.  Possibly right cerebellum.  The fact that multiple vascular distributions are involved raises the possibility of embolic disease. Watershed type infarct as a cause of the parasagittal left convexity infarcts is a possibility but would not explain surrounding small infarcts.  Remote small infarcts left parietal and right frontal lobe. Remote left thalamic infarcts.  Mild to moderate small vessel disease type changes.  No intracranial hemorrhage.  Atrophy  without hydrocephalus.  No intracranial mass lesion noted on this unenhanced exam.  Partially empty sella incidentally noted. Cervical medullary junction, pituitary region and orbital structures unremarkable.  Minimal paranasal sinus mucosal thickening.  MRA HEAD FINDINGS  Moderate to marked narrowing M1 segment right middle cerebral artery extending into the right middle cerebral artery bifurcation. Decrease number of visualized right middle cerebral artery branches.  Mild to moderate narrowing A1 -A2 aspect of the left anterior cerebral artery.  Left middle cerebral artery mild branch vessel irregularity.  Left vertebral artery is dominant. Areas of mild to moderate narrowing involving portions of the right vertebral artery.  Poor delineation of majority of the right posterior inferior cerebellar artery. Non visualization right posterior inferior cerebellar artery.  Mild to moderate narrowing proximal to mid basilar artery.  Poor delineation anterior inferior cerebellar artery.  Moderate narrowing superior cerebellar artery bilaterally.  Moderate tandem stenosis right posterior cerebral artery. Mild narrowing and irregularity left posterior cerebral artery.  No aneurysm detected.  IMPRESSION: Several small acute nonhemorrhagic infarcts in different vascular distributions as detailed above raising the possibility of embolic disease.  Intracranial atherosclerotic type changes as detailed above.  These results will be called to the ordering clinician or representative by the Radiologist Assistant, and communication documented in the PACS Dashboard.   Electronically Signed   By: Bridgett Larsson M.D.   On: 05/03/2013 16:17   Dg Chest Portable 1 View  04/28/2013   CLINICAL DATA:  Shortness of breath.  EXAM: PORTABLE CHEST - 1 VIEW  COMPARISON:  DG CHEST 2 VIEW dated 11/08/2012  FINDINGS: The lungs are adequately inflated. The interstitial markings are increased diffusely. The cardiopericardial silhouette is enlarged. Its  borders are somewhat indistinct. The central pulmonary vascularity is prominent but definite cephalization is not demonstrated. There is no pleural effusion or pneumothorax. The mediastinum is normal in width. There is calcification in the wall of the aortic arch. The observed portions of the bony structures exhibit no acute abnormalities.  IMPRESSION: Increased interstitial markings bilaterally are worrisome for interstitial edema of cardiac or noncardiac calls. One cannot exclude interstitial pneumonia in the appropriate clinical setting. A followup PA and lateral chest x-ray would be of value when the patient can tolerate the procedure.   Electronically Signed   By: David  Swaziland   On: 04/28/2013 11:39   Dg Chest Port 1v Same Day  05/04/2013   CLINICAL DATA:  Increased wheezing.  Shortness of breath.  EXAM: PORTABLE CHEST - 1 VIEW SAME DAY  COMPARISON:  Chest x-ray 04/28/2013.  FINDINGS: Previously noted min discretion previously noted mild interstitial pulmonary edema has resolved. No consolidative airspace disease. No pleural effusions. Heart size is within normal limits. The patient is rotated to the left on today's exam, resulting in distortion of the mediastinal contours and reduced diagnostic sensitivity and specificity for mediastinal pathology. Atherosclerosis in the thoracic aorta.  IMPRESSION: 1. Resolution of previously noted mild congestive heart failure. No new acute findings. 2. Atherosclerosis.   Electronically Signed   By: Trudie Reed M.D.   On: 05/04/2013 21:00  ASSESSMENT/PLAN:    S/p carotid stent.  Off low dose pressors.  Will mobilize.  D/w Dr. Pearlean BrownieSethi; he os ok for d/c.  Hypotension Likely related to baroreceptor trauma.    COntinue DAPT for now.  In 3 months, would consider stopping plavix and using NOAC, per Dr. Pearlean BrownieSethi.  Cardiomyopathy: Resume beta blocker when BP tolerates.  Hold for now since it has been ok off of beta blocker. He was on an ACE-I in the past.  Will see  how his renal function is doing tomorrow.    CAD: No active ischemia.  OK for d/c.  Corky CraftsVARANASI,Leonid Manus S., MD  05/11/2013  11:25 AM

## 2013-05-11 NOTE — Progress Notes (Signed)
CARDIAC REHAB PHASE I   PRE:  Rate/Rhythm: 67 SR  BP:  Supine: 123/54  Sitting:   Standing:    SaO2:   MODE:  Ambulation: 700 ft   POST:  Rate/Rhythm: 98 SR  BP:  Supine:   Sitting: 140/47  Standing:    SaO2:  1320-1419 Walked pt earlier 700 ft with hand held asst with steady gait. No dizziness. Asked to return for education when daughter here for translation. [name] here now and able to repeat back information of 2000 mg sodium restriction, when to call MD for weight gain, signs/symptoms of CHF, and 2L fluid restriction. Handouts re walking, low sodium diets, CRP 2, and CHF packet given for daughter to refer to. Declined CRP2 at this time but left brochure in case pt wants to attend. Daughter states adhering to low sodium will be difficult due to their diet. Discussed that someone is to walk with pt for exercise as he still is a little weak. Pt does not have scales but discussed with daughter importance of daily weights with low pumping of heart. Pt will have PT at home to assist with mobility also.   Luetta Nutting, RN BSN  05/11/2013 2:13 PM

## 2013-05-11 NOTE — Progress Notes (Signed)
Dr. Eldridge Dace notified of pt pulling IV out.  Order received to leave IV out.

## 2013-05-11 NOTE — Progress Notes (Signed)
Patient continues to remove monitor leads.  Is being discharged, so monitor left off. Dr. Eldridge Dace notified.

## 2013-05-11 NOTE — Discharge Summary (Signed)
Physician Discharge Summary  Patient ID: [name] MRN: [id number] DOB/AGE: [birth year] 80 y.o.  Admit date: 04/28/2013 Discharge date: 05/11/2013  Admission Diagnoses: Acute CHF and Afib   Discharge Diagnoses:  Principal Problem:   Acute CHF- presume secondary to AF and diastolic dysfunction. (Nl LVF 2010) Active Problems:   Hypertension   Atrial fibrillation- unknown duration   NSTEMI - ? type 2 - Troponin 0.63   Dyspnea   Chronic renal insufficiency, stage III (moderate)   PVD (peripheral vascular disease) 3.5cm AAA Aug 2014   CVA (cerebral infarction)   Discharged Condition: stable  Hospital Course: The patient is a 80 y/o male with a history of HTN, stage III CKD and an infrarenal AAA, last assessed by abdominal CT in August 2014 and measured at 3.5 cm. He had a 2D echo in May of 2010 which demonstrated an EF of 55-60%.   He presented to [facility] on 04/28/13 with complaints of progressively worsening dyspnea with orthopnea, as well as intermittent epigastric pain. On arrival to the ER, he was noted to be in atrial fibrillation with a ventricular response of 102 bpm. TSH was checked and was normal. He was started on a BB and later converted to NSR. Work up also revealed acute CHF, with evidence of peripheral edema on physical exam and pulmonary edema on CXR. IV Lasix was initiated. POC troponin was also positive at 0.21. Subsequent lab troponins were positive x 3 at 0.63, 0.43 and 0.39. IV heparin was initiated. A 2D echo demonstrated reduced systolic function, compared to prior study in 2010. His EF declined to 35%. There was also moderate MR and the left atrium was moderately to severely elevated.  There was global hypokinesis and akinesis of the basal inferior posterior wall.  A R/LHC was recommended. However, prior to undergoing the procedure, he was noticed to have difficulty feeding himself and had difficulty speaking. A stroke w/u was pursued. A CT of his head showed no acute  intracranial abnormality. MRI of the brain showed several small acute nonhemorrhagic infarcts in different vascular distributions. Bilateral carotid doppler studies revealed left ICA stenosis of > 80%. Neurology was consulted. tPA was not considered as the patient was anticoagulated on IV heparin. Neurology recommended reducing heparin level, continuation of ASA and recommended consideration for carotid artery stenting.   Once stable, he underwent a LHC by Dr. [place] (complete angiographic details listed below). He was found to have 3 vessel obstructive CAD with CTO of the RCA.  Given his age and co-morbidities, medical therapy was recommended. He was continued on ASA, a low dose BB and stain. No ACE due to renal function. Plavix was added. He later was evaluated by Dr. Allyson Sabal for possible L ICA stenting. The patient agreed to the procedure and underwent successful PTA and stenting of the L ICA. He tolerated the procedure well. He had no further issues. He was assessed by Neurology and it was determined that he was stable from a neuro standpoint. He was also last seen and examined by Dr. Eldridge Dace, who determined he was stable from a cardiac standpoint.  He will have initial post-hospital f/u with Tereso Newcomer, PA-C, but he will need to be followed long term by Dr. Charlyne Quale.   Final recommendations from Neurology are as follows:  Continue aspirin 325 mg, and Plavix for secondary stroke (aspirin 325 given stent and afib) x 3 months then plan to change to aspirin 81 mg daily and eliquis. No plans for oral anticoagulation at discharge due to  risk of bleeding on it an addition to dual antiplatelets.  Reassessment confirms Home health PT and OT.   Follow up with Dr. Pearlean BrownieSethi, Stroke Clinic, in 2 months.   Consults: Neurology  Significant Diagnostic Studies:   2D echo 05/01/13 LV EF: 35%  ------------------------------------------------------------ Study Conclusions  - Left ventricle: Diffuse  hypokineisis worse in the inferior wall and apex The cavity size was mildly dilated. Wall thickness was normal. The estimated ejection fraction was 35%. - Mitral valve: Moderate regurgitation. - Left atrium: The atrium was moderately to severely dilated. - Right atrium: The atrium was mildly dilated. - Atrial septum: No defect or patent foramen ovale was identified. - Impressions: Study not completed by tech and not placed in reading que until next day Impressions:  - Study not completed by tech and not placed in reading que until next day     MRI of Brain 05/03/13 IMPRESSION: Several small acute nonhemorrhagic infarcts in different vascular distributions as detailed above raising the possibility of embolic disease.    Carotid Doppler Study 05/03/13 Summary: There is mild mixed plaque in the right ECA. RICA velocities are in the 1-39% range. On the left there is severe mixedplaque in the bulb, ECA and ICA. LICA velocities are severely elevated in the proximal LICA, with post-stenotic turbulence and dilation. By color Doppler, there is near string sign flow in the proximal LICA. The distal LICA is patent. LICA velocities are in the 80-99% range. The LECA has severe mixed plaque withshadowing, possibly occluded.The vertebral arteries are patent with antegrade flow, bilaterally. The RICA ratio is .97 and the LICA ratio is 20.4.    LHC  Procedural Findings:  Hemodynamics:  AO 125/72 mean 95 mm Hg  LV 130/11 mm Hg  Coronary angiography:  Coronary dominance: right  Left mainstem: There is 60-70% stenosis in the distal left main.  Left anterior descending (LAD): The LAD is a large vessel extending around the apex. In the mid vessel there is a 60-70% stenosis. The first diagonal is small in caliber with diffuse 80-90% disease in the mid vessel. The second diagonal is also small in caliber with 80% disease proximally.  Ramus intermediate: This is a large branch without  significant disease.  Left circumflex (LCx): The LCx is small and gives rise to 2 small OM branches. There is 90% disease at the origin of the LCx. There is 80% disease at the bifurcation of the OMs.  Right coronary artery (RCA): The RCA is diffusely diseased in the proximal vessel and 100% occluded in the mid vessel. There are right to right and left to right collaterals to the distal RCA.  Left ventriculography: No done.  Final Conclusions:  1. 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 His most severe disease is in the small diagonal and LCx branches.  2. Normal LV EDP.  Treatments: See Hospital Course  Discharge Exam: Blood pressure 140/47, pulse 70, temperature 98.5 F (36.9 C), temperature source Oral, resp. rate 29, height 5\' 2"  (1.575 m), weight 144 lb 6.4 oz (65.5 kg), SpO2 97.00%.   Disposition: 01-Home or Self Care      Discharge Orders   Future Appointments Provider Department Dept Phone   05/25/2013 3:40 PM Beatrice LecherScott T Weaver, [facility]-C [facility] [address] [facility] [phone]   Future Orders Complete By Expires   Diet - low sodium heart healthy  As directed    Increase activity slowly  As directed        Medication List  STOP taking these medications       esomeprazole 40 MG capsule  Commonly known as:  NEXIUM  Replaced by:  pantoprazole 40 MG tablet      TAKE these medications       allopurinol 300 MG tablet  Commonly known as:  ZYLOPRIM  Take 300 mg by mouth daily.     aspirin 325 MG EC tablet  Take 1 tablet (325 mg total) by mouth daily.     atorvastatin 40 MG tablet  Commonly known as:  LIPITOR  Take 1 tablet (40 mg total) by mouth daily at 6 PM.     carvedilol 3.125 MG tablet  Commonly known as:  COREG  Take 1 tablet (3.125 mg total) by mouth 2 (two) times daily with a meal.     clopidogrel 75 MG tablet  Commonly known as:  PLAVIX  Take 1 tablet (75 mg total) by mouth daily with breakfast.     colchicine 0.6 MG tablet  Take 1 tablet (0.6 mg  total) by mouth 2 (two) times daily.     dicyclomine 20 MG tablet  Commonly known as:  BENTYL  Take 20 mg by mouth 4 (four) times daily.     nitroGLYCERIN 0.4 MG SL tablet  Commonly known as:  NITROSTAT  Place 1 tablet (0.4 mg total) under the tongue every 5 (five) minutes x 3 doses as needed for chest pain.     pantoprazole 40 MG tablet  Commonly known as:  PROTONIX  Take 1 tablet (40 mg total) by mouth daily.       Follow-up Information   Follow up with Gates Rigg, MD. Schedule an appointment as soon as possible for a visit in 2 months. (Stroke Clinic)    Specialties:  Neurology, Radiology   Contact information:   [address] [place] [address] [phone]       Follow up with Tereso Newcomer, PA-C On 05/25/2013. (3:40 PM )    Specialty:  Physician Assistant   Contact information:   [address] [place] [address] [phone]      TIME SPENT ON DISCHARGE, INCLUDING PHYSICIAN TIME: 35 MINUTES, with interpreter , giving instructions  Signed: Robbie Lis 05/11/2013, 1:13 PM   I have examined the patient and reviewed assessment and plan and discussed with patient.  Agree with above as stated.  Plan DAPT for 3 months.  Then NOAC after then.  F/u with neuro and with Dr. Allyson Sabal.  Betzabe Bevans S.

## 2013-05-17 ENCOUNTER — Telehealth: Payer: Self-pay | Admitting: Cardiology

## 2013-05-17 NOTE — Telephone Encounter (Signed)
New message     FYI Pt refuses advanced home care services

## 2013-05-17 NOTE — Telephone Encounter (Signed)
Noted/ I will forward as FYI.

## 2013-05-17 NOTE — Consult Note (Signed)
NAMELANNIE, MOGUL          ACCOUNT NO.:  [id number]  MEDICAL RECORD NO.:  [id number]  LOCATION:  2H12C                        FACILITY:  [facility]  PHYSICIAN:  Zhane Donlan K. Amanpreet Delmont, M.D.DATE OF BIRTH:  February 24, 1926  DATE OF CONSULTATION:  04/28/2013 DATE OF DISCHARGE:  05/11/2013                                CONSULTATION   CLINICAL HISTORY:  Patient with a left internal carotid artery stenosis.   EXAMINATION:   Intracranial interpretation of left common carotid arteriogram before and after placement of stent in the left internal carotid artery proximally with distal protection.  A left common carotid arteriogram prior to the stent placement demonstrates the flow in the distal left internal carotid artery to be normal.  There is wide patency of the left internal carotid in the petrous cavernous, and supraclinoid segments.  There is mild stenosis of the mid M1 segment.  The left middle and the left anterior cerebral arteries  otherwise opacify normally into the capillary and the venous phases.  A left posterior communicating artery w is  seen opacifying the left posterior cerebral artery distribution.  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  The post-stent placement  left common carotid arteriogram continues to demonstrate excellent flow through the left internal carotid artery, petrous cavernous, and supraclinoid segments.  No evidence of intraluminal  filling defects  Is  seen.  Only the AP projection presented  for  interpretation, following  stent placement .  IMPRESSION:  Angiographically, no evidence of  intraluminal filling defects  seen on the  post stent placement arteriogram of the left common carotid artery as described.          ______________________________ Grandville Silos Corliss Skains, M.D.     SKD/MEDQ  D:  05/16/2013  T:  05/17/2013  Job:  [id number]

## 2013-05-25 ENCOUNTER — Encounter: Payer: PRIVATE HEALTH INSURANCE | Admitting: Physician Assistant

## 2013-06-08 ENCOUNTER — Encounter: Payer: Self-pay | Admitting: Cardiovascular Disease

## 2013-06-08 ENCOUNTER — Ambulatory Visit (INDEPENDENT_AMBULATORY_CARE_PROVIDER_SITE_OTHER): Payer: PRIVATE HEALTH INSURANCE | Admitting: Cardiovascular Disease

## 2013-06-08 VITALS — BP 119/73 | HR 95 | Ht 61.0 in | Wt 147.0 lb

## 2013-06-08 DIAGNOSIS — I6529 Occlusion and stenosis of unspecified carotid artery: Secondary | ICD-10-CM

## 2013-06-08 DIAGNOSIS — I214 Non-ST elevation (NSTEMI) myocardial infarction: Secondary | ICD-10-CM

## 2013-06-08 DIAGNOSIS — I6350 Cerebral infarction due to unspecified occlusion or stenosis of unspecified cerebral artery: Secondary | ICD-10-CM

## 2013-06-08 DIAGNOSIS — I10 Essential (primary) hypertension: Secondary | ICD-10-CM

## 2013-06-08 DIAGNOSIS — I639 Cerebral infarction, unspecified: Secondary | ICD-10-CM

## 2013-06-08 DIAGNOSIS — E785 Hyperlipidemia, unspecified: Secondary | ICD-10-CM | POA: Insufficient documentation

## 2013-06-08 NOTE — Assessment & Plan Note (Signed)
On statin therapy. His most recent lipid profile performed 04/29/13 revealed a total cholesterol of 206, LDL 144 HDL of 39. He is on atorvastatin 3 mg. We will recheck a lipid and liver profile

## 2013-06-08 NOTE — Patient Instructions (Signed)
  We will see you back in follow up in 6 months with Dr Allyson Sabal.   Dr Allyson Sabal has ordered carotid duplex- This test is an ultrasound of the carotid arteries in your neck. It looks at blood flow through these arteries that supply the brain with blood. Allow one hour for this exam. There are no restrictions or special instructions.

## 2013-06-08 NOTE — Assessment & Plan Note (Signed)
The patient did apparently have multiple strokes and was in atrial fibrillation as well as having high-grade left internal carotid artery stenosis with a "string sign". He was evaluated by Dr. Pearlean Brownie, and neurologist, who felt that he was not a good candidate for endarterectomy but rather suggested carotid artery stenting. I perform this on 04/28/13. He is on aspirin and Plavix. He is neurologically asymptomatic. We will recheck a carotid Doppler study.

## 2013-06-08 NOTE — Progress Notes (Signed)
06/08/2013 [name]   11/20/1926  [id number]  Primary Physician Pamelia HoitWILSON,FRED HENRY, MD Primary Cardiologist: Runell GessJonathan J. Gradie Ohm MD Roseanne RenoFACP,FACC,FAHA, FSCAI   HPI:  [name] is a 80 year old married [place] male admitted for congestive heart failure. He was found to be in atrial fibrillation and apparently converted to sinus rhythm. The echocardiogram revealed severe dysfunction with an ejection fraction in the 35% range. He had stroke like symptoms and was evaluated by the stroke service. An MRI suggested multiple embolic strokes possibly related to the atial fibrillation.carotid Dopplers were performed and suggested high-grade left internal carotid artery stenosis with string sign". The patient underwent cardiac catheterization by Dr. SwazilandJordan the right radial approach that revealed 80% left main stenosis with an occluded dominant RCA.. Consensus was to treat him medically for this. I was asked to see the patient for consideration of left internal carotid artery stenting. Extensive conversation was had with the patient and his family and after careful consideration of the risks and benefits of medical therapy versus percutaneous intervention, it was decided to proceed with carotid artery stenting.this was performed on 04/28/13 successfully. He remains on Dilantin about therapy. We will recheck a carotid Doppler study.    Current Outpatient Prescriptions  Medication Sig Dispense Refill  . allopurinol (ZYLOPRIM) 300 MG tablet Take 300 mg by mouth daily.      [name]. aspirin EC 325 MG EC tablet Take 1 tablet (325 mg total) by mouth daily.  30 tablet  5  . atorvastatin (LIPITOR) 40 MG tablet Take 1 tablet (40 mg total) by mouth daily at 6 PM.  30 tablet  5  . carvedilol (COREG) 3.125 MG tablet Take 1 tablet (3.125 mg total) by mouth 2 (two) times daily with a meal.  60 tablet  5  . clopidogrel (PLAVIX) 75 MG tablet Take 1 tablet (75 mg total) by mouth daily with breakfast.  30 tablet  5  .  nitroGLYCERIN (NITROSTAT) 0.4 MG SL tablet Place 1 tablet (0.4 mg total) under the tongue every 5 (five) minutes x 3 doses as needed for chest pain.  25 tablet  5  . pantoprazole (PROTONIX) 40 MG tablet Take 1 tablet (40 mg total) by mouth daily.  40 tablet  5   No current facility-administered medications for this visit.    Allergies  Allergen Reactions  . Phenazopyridine Hcl Other (See Comments)    unknown  . Septra [Sulfamethoxazole-Tmp Ds] Other (See Comments)    unknown    History   Social History  . Marital Status: Married    Spouse Name: N/A    Number of Children: [id number]  . Years of Education: N/A   Occupational History  . Not on file.   Social History Main Topics  . Smoking status: Former Smoker    Quit date: 02/17/2002  . Smokeless tobacco: Never Used     Comment: Quit seven years ago   . Alcohol Use: No  . Drug Use: No  . Sexual Activity: Not on file   Other Topics Concern  . Not on file   Social History Narrative  . No narrative on file     Review of Systems: General: negative for chills, fever, night sweats or weight changes.  Cardiovascular: negative for chest pain, dyspnea on exertion, edema, orthopnea, palpitations, paroxysmal nocturnal dyspnea or shortness of breath Dermatological: negative for rash Respiratory: negative for cough or wheezing Urologic: negative for hematuria Abdominal: negative for nausea, vomiting, diarrhea, bright red blood per rectum,  melena, or hematemesis Neurologic: negative for visual changes, syncope, or dizziness All other systems reviewed and are otherwise negative except as noted above.    Blood pressure 119/73, pulse 95, height 5\' 1"  (1.549 m), weight 147 lb (66.679 kg).  General appearance: alert and no distress Neck: no adenopathy, no carotid bruit, no JVD, supple, symmetrical, trachea midline and thyroid not enlarged, symmetric, no tenderness/mass/nodules Lungs: clear to auscultation bilaterally Heart: irregularly  irregular rhythm Extremities: extremities normal, atraumatic, no cyanosis or edema  EKG not performed today  ASSESSMENT AND PLAN:   CVA (cerebral infarction) The patient did apparently have multiple strokes and was in atrial fibrillation as well as having high-grade left internal carotid artery stenosis with a "string sign". He was evaluated by Dr. Pearlean Brownie, and neurologist, who felt that he was not a good candidate for endarterectomy but rather suggested carotid artery stenting. I perform this on 04/28/13. He is on aspirin and Plavix. He is neurologically asymptomatic. We will recheck a carotid Doppler study.  NSTEMI - ? type 2 - Troponin 0.63 The patient had a non-STEMI. He underwent cardiac catheterization performed radially by Dr. Swaziland revealing 80% left main with 3 vessel disease. A 2-D echo revealed an EF of 35%. He was deemed not a coronary artery bypass graft candidate or a percutaneous intervention to the right rather medical therapy was recommended.  Hypertension Controlled on current medications  Hyperlipidemia On statin therapy. His most recent lipid profile performed 04/29/13 revealed a total cholesterol of 206, LDL 144 HDL of 39. He is on atorvastatin 3 mg. We will recheck a lipid and liver profile      [name] [facility], [facility] 06/08/2013 12:06 PM

## 2013-06-08 NOTE — Assessment & Plan Note (Signed)
Controlled on current medications 

## 2013-06-08 NOTE — Assessment & Plan Note (Signed)
The patient had a non-STEMI. He underwent cardiac catheterization performed radially by Dr. Swaziland revealing 80% left main with 3 vessel disease. A 2-D echo revealed an EF of 35%. He was deemed not a coronary artery bypass graft candidate or a percutaneous intervention to the right rather medical therapy was recommended.

## 2013-06-22 ENCOUNTER — Ambulatory Visit (HOSPITAL_COMMUNITY)
Admission: RE | Admit: 2013-06-22 | Discharge: 2013-06-22 | Disposition: A | Discharge status: Home / Self Care | Payer: PRIVATE HEALTH INSURANCE | Source: Ambulatory Visit | Attending: Cardiovascular Disease | Admitting: Cardiovascular Disease

## 2013-06-22 DIAGNOSIS — I6529 Occlusion and stenosis of unspecified carotid artery: Secondary | ICD-10-CM | POA: Diagnosis present

## 2013-06-22 NOTE — Progress Notes (Signed)
Carotid Duplex Completed. Kassie Keng, BS, RDMS, RVT  

## 2013-06-24 ENCOUNTER — Encounter: Payer: Self-pay | Admitting: *Deleted

## 2013-07-13 ENCOUNTER — Ambulatory Visit: Payer: Self-pay | Admitting: Neurology

## 2013-09-08 ENCOUNTER — Encounter: Payer: Self-pay | Admitting: Neurology

## 2013-09-08 ENCOUNTER — Ambulatory Visit: Payer: Self-pay | Admitting: Neurology

## 2013-09-08 ENCOUNTER — Ambulatory Visit (INDEPENDENT_AMBULATORY_CARE_PROVIDER_SITE_OTHER): Payer: PRIVATE HEALTH INSURANCE | Admitting: Neurology

## 2013-09-08 VITALS — BP 126/74 | HR 99 | Ht 60.0 in | Wt 148.4 lb

## 2013-09-08 DIAGNOSIS — I4891 Unspecified atrial fibrillation: Secondary | ICD-10-CM

## 2013-09-08 DIAGNOSIS — I482 Chronic atrial fibrillation, unspecified: Secondary | ICD-10-CM

## 2013-09-08 DIAGNOSIS — I714 Abdominal aortic aneurysm, without rupture, unspecified: Secondary | ICD-10-CM

## 2013-09-08 DIAGNOSIS — I6340 Cerebral infarction due to embolism of unspecified cerebral artery: Secondary | ICD-10-CM

## 2013-09-08 MED ORDER — ASPIRIN EC 81 MG PO TBEC: 81.0000 mg | DELAYED_RELEASE_TABLET | Freq: Every day | ORAL | Status: DC

## 2013-09-08 MED ORDER — APIXABAN 2.5 MG PO TABS: 2.5000 mg | ORAL_TABLET | Freq: Two times a day (BID) | ORAL | Status: DC

## 2013-09-08 NOTE — Patient Instructions (Signed)
1. Will stop plavix and change ASA from 325mg  to 81mg  2. Start eliquis 2.5mg  twice a day, please do not missing doses 3. Continue lipitor for stroke prevention and HLD 4. Continue other medications 5. Monitor BP at home 6. Low fat and low sodium diet 7. Follow up with Dr. Gery Pray and Dr. Andrey Campanile closely 8. Watch for any sign of bleeding and take precautions to avoid trauma 9. Follow up in clinic in 2 months. 10. Will check some blood test today.

## 2013-09-08 NOTE — Progress Notes (Signed)
STROKE NEUROLOGY FOLLOW UP NOTE  NAME: [name] DOB: December 21, 1926  REASON FOR VISIT: stroke follow up HISTORY FROM: daughter and chart  Today we had the pleasure of seeing [name] in follow-up at our [facility]rology [facility]. Pt was accompanied by daughter and interpreter.   History Summary [name] is an 80 y.o. male with a history of CHF, HTN, CKD stage III and infrarenal AAA was admitted on 04/28/2013 for management of atrial fibrillation with RVR and heart failure. POC troponin was also positive at 0.21. Subsequent lab troponins were positive x 3 at 0.63, 0.43 and 0.39. IV heparin was initiated. A 2D echo demonstrated reduced systolic function, compared to prior study in 2010. His EF declined to 35%. A R/LHC was recommended. However, prior to undergoing the procedure, he was noticed by daughter to have difficulty feeding himself and had difficulty speaking. A stroke w/u was pursued. A CT of his head showed no acute intracranial abnormality. MRI of the brain showed several small acute nonhemorrhagic infarcts in different vascular distributions. Bilateral carotid doppler studies revealed left ICA stenosis of > 80%. Neurology was consulted. tPA was not considered as the patient was anticoagulated on IV heparin. Neurology recommended reducing heparin level, continuation of ASA and recommended consideration for carotid artery stenting.   Once stable, he underwent a LHC by Dr. [place] (complete angiographic details listed below). He was found to have 3 vessel obstructive CAD with CTO of the RCA. Given his age and co-morbidities, medical therapy was recommended. He was continued on ASA, a low dose BB and stain. No ACE due to renal function. Plavix was added. He later was evaluated by Dr. Allyson Sabal for possible L ICA stenting. The patient agreed to the procedure and underwent successful PTA and stenting of the L ICA. He tolerated the procedure well. He had no further issues. On discharge,  neuro recommended to continue aspirin 325 mg, and Plavix for secondary stroke (aspirin 325 given stent and afib) x 3 months then plan to change to aspirin 81 mg daily and eliquis. No plans for oral anticoagulation at discharge due to risk of bleeding on it an addition to dual antiplatelets.  Interval History During the interval time, the patient has been doing well. He was followed with Dr. Gery Pray and had repeat CUS showed patent ICA bilaterally. He was continued on ASA 325 and plavix.along with lipitor for stroke and cardiac prevention. As per daughter, he still has SOB sometime, but largely at his baseline, walking and talking no problem. Like to eat fatty and salty food.    REVIEW OF SYSTEMS: Full 14 system review of systems performed and notable only for those listed below and in HPI above, all others are negative:  Constitutional: fatigue  Cardiovascular: N/A  Ear/Nose/Throat: hearing loss  Skin: N/A  Eyes: blurry vision  Respiratory: N/A  Gastroitestinal: N/A  Genitourinary: N/A Hematology/Lymphatic: N/A  Endocrine: N/A  Musculoskeletal: neck stiffness  Allergy/Immunology: N/A  Neurological: dizziness  Psychiatric: N/A  The following represents the patient's updated allergies and side effects list: Allergies  Allergen Reactions  . Phenazopyridine Hcl Other (See Comments)    unknown  . Septra [Sulfamethoxazole-Tmp Ds] Other (See Comments)    unknown    Labs since last visit of relevance include the following: Results for orders placed during the hospital encounter of 04/28/13  MRSA PCR SCREENING      Result Value Ref Range   MRSA by PCR NEGATIVE  NEGATIVE  CBC WITH DIFFERENTIAL  Result Value Ref Range   WBC 9.3  4.0 - 10.5 K/uL   RBC 4.41  4.22 - 5.81 MIL/uL   Hemoglobin 13.0  13.0 - 17.0 g/dL   HCT 16.1 (*) 09.6 - 04.5 %   MCV 86.4  78.0 - 100.0 fL   MCH 29.5  26.0 - 34.0 pg   MCHC 34.1  30.0 - 36.0 g/dL   RDW 40.9  81.1 - 91.4 %   Platelets 225  150 - 400 K/uL     Neutrophils Relative % 76  43 - 77 %   Neutro Abs 7.1  1.7 - 7.7 K/uL   Lymphocytes Relative 15  12 - 46 %   Lymphs Abs 1.4  0.7 - 4.0 K/uL   Monocytes Relative 6  3 - 12 %   Monocytes Absolute 0.6  0.1 - 1.0 K/uL   Eosinophils Relative 2  0 - 5 %   Eosinophils Absolute 0.2  0.0 - 0.7 K/uL   Basophils Relative 0  0 - 1 %   Basophils Absolute 0.0  0.0 - 0.1 K/uL  COMPREHENSIVE METABOLIC PANEL      Result Value Ref Range   Sodium 140  137 - 147 mEq/L   Potassium 3.6 (*) 3.7 - 5.3 mEq/L   Chloride 101  96 - 112 mEq/L   CO2 21  19 - 32 mEq/L   Glucose, Bld 129 (*) 70 - 99 mg/dL   BUN 24 (*) 6 - 23 mg/dL   Creatinine, Ser 7.82 (*) 0.50 - 1.35 mg/dL   Calcium 9.4  8.4 - 95.6 mg/dL   Total Protein 7.5  6.0 - 8.3 g/dL   Albumin 3.5  3.5 - 5.2 g/dL   AST 25  0 - 37 U/L   ALT 15  0 - 53 U/L   Alkaline Phosphatase 75  39 - 117 U/L   Total Bilirubin 0.5  0.3 - 1.2 mg/dL   GFR calc non Af Amer 44 (*) >90 mL/min   GFR calc Af Amer 51 (*) >90 mL/min  PRO B NATRIURETIC PEPTIDE      Result Value Ref Range   Pro B Natriuretic peptide (BNP) 2543.0 (*) 0 - 450 pg/mL  PROTIME-INR      Result Value Ref Range   Prothrombin Time 12.3  11.6 - 15.2 seconds   INR 0.93  0.00 - 1.49  HEPARIN LEVEL (UNFRACTIONATED)      Result Value Ref Range   Heparin Unfractionated 0.54  0.30 - 0.70 IU/mL  TROPONIN I      Result Value Ref Range   Troponin I 0.63 (*) <0.30 ng/mL  TROPONIN I      Result Value Ref Range   Troponin I 0.43 (*) <0.30 ng/mL  TROPONIN I      Result Value Ref Range   Troponin I 0.39 (*) <0.30 ng/mL  TSH      Result Value Ref Range   TSH 2.159  0.350 - 4.500 uIU/mL  BASIC METABOLIC PANEL      Result Value Ref Range   Sodium 139  137 - 147 mEq/L   Potassium 3.8  3.7 - 5.3 mEq/L   Chloride 100  96 - 112 mEq/L   CO2 22  19 - 32 mEq/L   Glucose, Bld 119 (*) 70 - 99 mg/dL   BUN 27 (*) 6 - 23 mg/dL   Creatinine, Ser 2.13 (*) 0.50 - 1.35 mg/dL   Calcium 9.0  8.4 - 08.6 mg/dL  GFR  calc non Af Amer 35 (*) >90 mL/min   GFR calc Af Amer 41 (*) >90 mL/min  HEPARIN LEVEL (UNFRACTIONATED)      Result Value Ref Range   Heparin Unfractionated 0.37  0.30 - 0.70 IU/mL  CBC      Result Value Ref Range   WBC 7.6  4.0 - 10.5 K/uL   RBC 4.25  4.22 - 5.81 MIL/uL   Hemoglobin 12.3 (*) 13.0 - 17.0 g/dL   HCT 40.9 (*) 81.1 - 91.4 %   MCV 85.6  78.0 - 100.0 fL   MCH 28.9  26.0 - 34.0 pg   MCHC 33.8  30.0 - 36.0 g/dL   RDW 78.2  95.6 - 21.3 %   Platelets 231  150 - 400 K/uL  LIPID PANEL      Result Value Ref Range   Cholesterol 206 (*) 0 - 200 mg/dL   Triglycerides 086  <578 mg/dL   HDL 39 (*) >46 mg/dL   Total CHOL/HDL Ratio 5.3     VLDL 23  0 - 40 mg/dL   LDL Cholesterol 962 (*) 0 - 99 mg/dL  HEPARIN LEVEL (UNFRACTIONATED)      Result Value Ref Range   Heparin Unfractionated 0.34  0.30 - 0.70 IU/mL  CBC      Result Value Ref Range   WBC 9.4  4.0 - 10.5 K/uL   RBC 4.23  4.22 - 5.81 MIL/uL   Hemoglobin 12.6 (*) 13.0 - 17.0 g/dL   HCT 95.2 (*) 84.1 - 32.4 %   MCV 86.1  78.0 - 100.0 fL   MCH 29.8  26.0 - 34.0 pg   MCHC 34.6  30.0 - 36.0 g/dL   RDW 40.1  02.7 - 25.3 %   Platelets 241  150 - 400 K/uL  HEPARIN LEVEL (UNFRACTIONATED)      Result Value Ref Range   Heparin Unfractionated 0.49  0.30 - 0.70 IU/mL  CBC      Result Value Ref Range   WBC 6.7  4.0 - 10.5 K/uL   RBC 4.18 (*) 4.22 - 5.81 MIL/uL   Hemoglobin 12.3 (*) 13.0 - 17.0 g/dL   HCT 66.4 (*) 40.3 - 47.4 %   MCV 86.1  78.0 - 100.0 fL   MCH 29.4  26.0 - 34.0 pg   MCHC 34.2  30.0 - 36.0 g/dL   RDW 25.9  56.3 - 87.5 %   Platelets 211  150 - 400 K/uL  BASIC METABOLIC PANEL      Result Value Ref Range   Sodium 138  137 - 147 mEq/L   Potassium 4.0  3.7 - 5.3 mEq/L   Chloride 97  96 - 112 mEq/L   CO2 24  19 - 32 mEq/L   Glucose, Bld 99  70 - 99 mg/dL   BUN 36 (*) 6 - 23 mg/dL   Creatinine, Ser 6.43 (*) 0.50 - 1.35 mg/dL   Calcium 8.9  8.4 - 32.9 mg/dL   GFR calc non Af Amer 23 (*) >90 mL/min   GFR  calc Af Amer 27 (*) >90 mL/min  HEPARIN LEVEL (UNFRACTIONATED)      Result Value Ref Range   Heparin Unfractionated 0.32  0.30 - 0.70 IU/mL  CBC      Result Value Ref Range   WBC 7.4  4.0 - 10.5 K/uL   RBC 4.54  4.22 - 5.81 MIL/uL   Hemoglobin 13.7  13.0 - 17.0 g/dL   HCT  38.9 (*) 39.0 - 52.0 %   MCV 85.7  78.0 - 100.0 fL   MCH 30.2  26.0 - 34.0 pg   MCHC 35.2  30.0 - 36.0 g/dL   RDW 60.4  54.0 - 98.1 %   Platelets 236  150 - 400 K/uL  BASIC METABOLIC PANEL      Result Value Ref Range   Sodium 135 (*) 137 - 147 mEq/L   Potassium 3.9  3.7 - 5.3 mEq/L   Chloride 97  96 - 112 mEq/L   CO2 23  19 - 32 mEq/L   Glucose, Bld 119 (*) 70 - 99 mg/dL   BUN 25 (*) 6 - 23 mg/dL   Creatinine, Ser 1.91 (*) 0.50 - 1.35 mg/dL   Calcium 9.0  8.4 - 47.8 mg/dL   GFR calc non Af Amer 31 (*) >90 mL/min   GFR calc Af Amer 36 (*) >90 mL/min  HEPARIN LEVEL (UNFRACTIONATED)      Result Value Ref Range   Heparin Unfractionated 0.63  0.30 - 0.70 IU/mL  CBC      Result Value Ref Range   WBC 6.7  4.0 - 10.5 K/uL   RBC 4.48  4.22 - 5.81 MIL/uL   Hemoglobin 13.2  13.0 - 17.0 g/dL   HCT 29.5  62.1 - 30.8 %   MCV 87.3  78.0 - 100.0 fL   MCH 29.5  26.0 - 34.0 pg   MCHC 33.8  30.0 - 36.0 g/dL   RDW 65.7  84.6 - 96.2 %   Platelets 216  150 - 400 K/uL  HEMOGLOBIN A1C      Result Value Ref Range   Hemoglobin A1C 6.0 (*) <5.7 %   Mean Plasma Glucose 126 (*) <117 mg/dL  HEPARIN LEVEL (UNFRACTIONATED)      Result Value Ref Range   Heparin Unfractionated <0.10 (*) 0.30 - 0.70 IU/mL  BASIC METABOLIC PANEL      Result Value Ref Range   Sodium 139  137 - 147 mEq/L   Potassium 4.2  3.7 - 5.3 mEq/L   Chloride 100  96 - 112 mEq/L   CO2 22  19 - 32 mEq/L   Glucose, Bld 95  70 - 99 mg/dL   BUN 22  6 - 23 mg/dL   Creatinine, Ser 9.52 (*) 0.50 - 1.35 mg/dL   Calcium 9.5  8.4 - 84.1 mg/dL   GFR calc non Af Amer 36 (*) >90 mL/min   GFR calc Af Amer 41 (*) >90 mL/min  HEPARIN LEVEL (UNFRACTIONATED)      Result  Value Ref Range   Heparin Unfractionated 0.28 (*) 0.30 - 0.70 IU/mL  CBC      Result Value Ref Range   WBC 7.3  4.0 - 10.5 K/uL   RBC 4.23  4.22 - 5.81 MIL/uL   Hemoglobin 12.5 (*) 13.0 - 17.0 g/dL   HCT 32.4 (*) 40.1 - 02.7 %   MCV 87.2  78.0 - 100.0 fL   MCH 29.6  26.0 - 34.0 pg   MCHC 33.9  30.0 - 36.0 g/dL   RDW 25.3  66.4 - 40.3 %   Platelets 202  150 - 400 K/uL  HEPARIN LEVEL (UNFRACTIONATED)      Result Value Ref Range   Heparin Unfractionated 0.29 (*) 0.30 - 0.70 IU/mL  BASIC METABOLIC PANEL      Result Value Ref Range   Sodium 136 (*) 137 - 147 mEq/L   Potassium 4.3  3.7 - 5.3 mEq/L   Chloride 101  96 - 112 mEq/L   CO2 22  19 - 32 mEq/L   Glucose, Bld 95  70 - 99 mg/dL   BUN 23  6 - 23 mg/dL   Creatinine, Ser 1.61 (*) 0.50 - 1.35 mg/dL   Calcium 8.9  8.4 - 09.6 mg/dL   GFR calc non Af Amer 35 (*) >90 mL/min   GFR calc Af Amer 40 (*) >90 mL/min  CBC      Result Value Ref Range   WBC 5.8  4.0 - 10.5 K/uL   RBC 4.01 (*) 4.22 - 5.81 MIL/uL   Hemoglobin 11.8 (*) 13.0 - 17.0 g/dL   HCT 04.5 (*) 40.9 - 81.1 %   MCV 87.3  78.0 - 100.0 fL   MCH 29.4  26.0 - 34.0 pg   MCHC 33.7  30.0 - 36.0 g/dL   RDW 91.4  78.2 - 95.6 %   Platelets 193  150 - 400 K/uL  BASIC METABOLIC PANEL      Result Value Ref Range   Sodium 137  137 - 147 mEq/L   Potassium 3.9  3.7 - 5.3 mEq/L   Chloride 103  96 - 112 mEq/L   CO2 21  19 - 32 mEq/L   Glucose, Bld 106 (*) 70 - 99 mg/dL   BUN 17  6 - 23 mg/dL   Creatinine, Ser 2.13 (*) 0.50 - 1.35 mg/dL   Calcium 8.7  8.4 - 08.6 mg/dL   GFR calc non Af Amer 36 (*) >90 mL/min   GFR calc Af Amer 42 (*) >90 mL/min  GLUCOSE, CAPILLARY      Result Value Ref Range   Glucose-Capillary 91  70 - 99 mg/dL  HEPARIN LEVEL (UNFRACTIONATED)      Result Value Ref Range   Heparin Unfractionated 0.47  0.30 - 0.70 IU/mL  CBC      Result Value Ref Range   WBC 4.8  4.0 - 10.5 K/uL   RBC 3.96 (*) 4.22 - 5.81 MIL/uL   Hemoglobin 11.6 (*) 13.0 - 17.0 g/dL   HCT  57.8 (*) 46.9 - 52.0 %   MCV 85.9  78.0 - 100.0 fL   MCH 29.3  26.0 - 34.0 pg   MCHC 34.1  30.0 - 36.0 g/dL   RDW 62.9  52.8 - 41.3 %   Platelets 186  150 - 400 K/uL  BASIC METABOLIC PANEL      Result Value Ref Range   Sodium 138  137 - 147 mEq/L   Potassium 3.7  3.7 - 5.3 mEq/L   Chloride 100  96 - 112 mEq/L   CO2 23  19 - 32 mEq/L   Glucose, Bld 108 (*) 70 - 99 mg/dL   BUN 12  6 - 23 mg/dL   Creatinine, Ser 2.44 (*) 0.50 - 1.35 mg/dL   Calcium 8.9  8.4 - 01.0 mg/dL   GFR calc non Af Amer 41 (*) >90 mL/min   GFR calc Af Amer 48 (*) >90 mL/min  HEPARIN LEVEL (UNFRACTIONATED)      Result Value Ref Range   Heparin Unfractionated 0.58  0.30 - 0.70 IU/mL  HEPARIN LEVEL (UNFRACTIONATED)      Result Value Ref Range   Heparin Unfractionated 0.71 (*) 0.30 - 0.70 IU/mL  CBC      Result Value Ref Range   WBC 6.2  4.0 - 10.5 K/uL   RBC 4.23  4.22 - 5.81  MIL/uL   Hemoglobin 12.5 (*) 13.0 - 17.0 g/dL   HCT 02.7 (*) 25.3 - 66.4 %   MCV 84.2  78.0 - 100.0 fL   MCH 29.6  26.0 - 34.0 pg   MCHC 35.1  30.0 - 36.0 g/dL   RDW 40.3  47.4 - 25.9 %   Platelets 191  150 - 400 K/uL  BASIC METABOLIC PANEL      Result Value Ref Range   Sodium 135 (*) 137 - 147 mEq/L   Potassium 3.3 (*) 3.7 - 5.3 mEq/L   Chloride 99  96 - 112 mEq/L   CO2 20  19 - 32 mEq/L   Glucose, Bld 120 (*) 70 - 99 mg/dL   BUN 9  6 - 23 mg/dL   Creatinine, Ser 5.63 (*) 0.50 - 1.35 mg/dL   Calcium 9.0  8.4 - 87.5 mg/dL   GFR calc non Af Amer 41 (*) >90 mL/min   GFR calc Af Amer 48 (*) >90 mL/min  HEPARIN LEVEL (UNFRACTIONATED)      Result Value Ref Range   Heparin Unfractionated 0.56  0.30 - 0.70 IU/mL  HEPARIN LEVEL (UNFRACTIONATED)      Result Value Ref Range   Heparin Unfractionated 0.40  0.30 - 0.70 IU/mL  CBC      Result Value Ref Range   WBC 7.1  4.0 - 10.5 K/uL   RBC 4.38  4.22 - 5.81 MIL/uL   Hemoglobin 12.9 (*) 13.0 - 17.0 g/dL   HCT 64.3 (*) 32.9 - 51.8 %   MCV 84.5  78.0 - 100.0 fL   MCH 29.5  26.0 - 34.0  pg   MCHC 34.9  30.0 - 36.0 g/dL   RDW 84.1  66.0 - 63.0 %   Platelets 208  150 - 400 K/uL  BASIC METABOLIC PANEL      Result Value Ref Range   Sodium 138  137 - 147 mEq/L   Potassium 3.6 (*) 3.7 - 5.3 mEq/L   Chloride 102  96 - 112 mEq/L   CO2 21  19 - 32 mEq/L   Glucose, Bld 94  70 - 99 mg/dL   BUN 9  6 - 23 mg/dL   Creatinine, Ser 1.60 (*) 0.50 - 1.35 mg/dL   Calcium 9.1  8.4 - 10.9 mg/dL   GFR calc non Af Amer 42 (*) >90 mL/min   GFR calc Af Amer 48 (*) >90 mL/min  HEPARIN LEVEL (UNFRACTIONATED)      Result Value Ref Range   Heparin Unfractionated 0.27 (*) 0.30 - 0.70 IU/mL  HEPARIN LEVEL (UNFRACTIONATED)      Result Value Ref Range   Heparin Unfractionated 0.43  0.30 - 0.70 IU/mL  CBC      Result Value Ref Range   WBC 6.9  4.0 - 10.5 K/uL   RBC 4.12 (*) 4.22 - 5.81 MIL/uL   Hemoglobin 12.1 (*) 13.0 - 17.0 g/dL   HCT 32.3 (*) 55.7 - 32.2 %   MCV 85.0  78.0 - 100.0 fL   MCH 29.4  26.0 - 34.0 pg   MCHC 34.6  30.0 - 36.0 g/dL   RDW 02.5  42.7 - 06.2 %   Platelets 196  150 - 400 K/uL  BASIC METABOLIC PANEL      Result Value Ref Range   Sodium 136 (*) 137 - 147 mEq/L   Potassium 3.9  3.7 - 5.3 mEq/L   Chloride 100  96 - 112 mEq/L   CO2  21  19 - 32 mEq/L   Glucose, Bld 103 (*) 70 - 99 mg/dL   BUN 9  6 - 23 mg/dL   Creatinine, Ser 1.61 (*) 0.50 - 1.35 mg/dL   Calcium 8.9  8.4 - 09.6 mg/dL   GFR calc non Af Amer 40 (*) >90 mL/min   GFR calc Af Amer 46 (*) >90 mL/min  HEPARIN LEVEL (UNFRACTIONATED)      Result Value Ref Range   Heparin Unfractionated 0.48  0.30 - 0.70 IU/mL  BASIC METABOLIC PANEL      Result Value Ref Range   Sodium 136 (*) 137 - 147 mEq/L   Potassium 3.8  3.7 - 5.3 mEq/L   Chloride 101  96 - 112 mEq/L   CO2 21  19 - 32 mEq/L   Glucose, Bld 91  70 - 99 mg/dL   BUN 11  6 - 23 mg/dL   Creatinine, Ser 0.45 (*) 0.50 - 1.35 mg/dL   Calcium 8.2 (*) 8.4 - 10.5 mg/dL   GFR calc non Af Amer 40 (*) >90 mL/min   GFR calc Af Amer 46 (*) >90 mL/min  CBC       Result Value Ref Range   WBC 8.3  4.0 - 10.5 K/uL   RBC 3.70 (*) 4.22 - 5.81 MIL/uL   Hemoglobin 11.0 (*) 13.0 - 17.0 g/dL   HCT 40.9 (*) 81.1 - 91.4 %   MCV 85.7  78.0 - 100.0 fL   MCH 29.7  26.0 - 34.0 pg   MCHC 34.7  30.0 - 36.0 g/dL   RDW 78.2  95.6 - 21.3 %   Platelets 213  150 - 400 K/uL  BASIC METABOLIC PANEL      Result Value Ref Range   Sodium 137  137 - 147 mEq/L   Potassium 3.6 (*) 3.7 - 5.3 mEq/L   Chloride 101  96 - 112 mEq/L   CO2 19  19 - 32 mEq/L   Glucose, Bld 89  70 - 99 mg/dL   BUN 9  6 - 23 mg/dL   Creatinine, Ser 0.86 (*) 0.50 - 1.35 mg/dL   Calcium 8.5  8.4 - 57.8 mg/dL   GFR calc non Af Amer 44 (*) >90 mL/min   GFR calc Af Amer 51 (*) >90 mL/min  I-STAT TROPOININ, ED      Result Value Ref Range   Troponin i, poc 0.21 (*) 0.00 - 0.08 ng/mL   Comment NOTIFIED PHYSICIAN     Comment 3           POCT ACTIVATED CLOTTING TIME      Result Value Ref Range   Activated Clotting Time 393      The neurologically relevant items on the patient's problem list were reviewed on today's visit.  Neurologic Examination  A problem focused neurological exam (12 or more points of the single system neurologic examination, vital signs counts as 1 point, cranial nerves count for 8 points) was performed.  Blood pressure 126/74, pulse 99, height 5' (1.524 m), weight 148 lb 6.4 oz (67.314 kg).  General - Well nourished, well developed, in no apparent distress.  Ophthalmologic - not cooperative on exam.  Cardiovascular - irregularly irregular heart rhythm.  Mental Status -  Level of arousal and orientation to time, place, and person were intact. Language including expression and comprehension was assessed and found intact.  Cranial Nerves II - XII - II - Visual field intact OU. III, IV, VI - Extraocular  movements intact. V - Facial sensation intact bilaterally. VII - Facial movement intact bilaterally. VIII - Hard of hearing. X - Palate elevates symmetrically. XI -  Chin turning & shoulder shrug intact bilaterally. XII - Tongue protrusion intact.  Motor Strength - The patient's strength was normal in all extremities and pronator drift was absent.  Bulk was normal and fasciculations were absent.   Motor Tone - Muscle tone was assessed at the neck and appendages and was normal.  Reflexes - The patient's reflexes were decreased in all extremities and he had no pathological reflexes.  Sensory - Light touch, temperature/pinprick were assessed and were symmetrical.    Coordination - not cooperative on exam due to language difficulty.  Tremor was absent.  Gait and Station - wide based gait and stooped posturing on walking.  Data reviewed: I personally reviewed the images and agree with the radiology interpretations.  CT of the brain 05/01/2013 1. No acute intracranial abnormalities. 2. Mild cerebral atrophy. 3. Chronic microvascular ischemic changes in the cerebral white matter.  MRI of the brain 05/02/2013 Several small acute nonhemorrhagic infarcts in different vascular distributions as detailed below raising the possibility of embolic disease.  Left frontal -parietal region in a slightly parasagittal  distribution.  Superior left peri operculum region.  Posterior limb left internal capsule.  Posterior left temporal-occipital lobe.  Right parietal lobe.  Possibly right cerebellum. MRA of the brain 05/02/2013 Intracranial atherosclerotic type changes  2D Echocardiogram Diffuse hypokineisis worse in the inferior wall and apex The cavity size was mildly dilated. Wall thickness was normal. The estimated ejection fraction was 35%. Moderate MV regurgitation.  Carotid Doppler RICA velocities 1-39% and the LICA velocities are 80-99%. Vertebral artery flow is patent and flow is antegrade, bilaterally.  CXR 05/04/2013 1. Resolution of previously noted mild congestive heart failure. No new acute findings. 2. Atherosclerosis.  EKG atrial fibrillation. For complete  results please see formal report.   Assessment: As you may recall, he is a 80 y.o. Asian male with a diagnosis of stroke. He had cardioembolic stroke in 04/2013 most likely related to his newly diagnosed afib. He was on IV heparin at that time due to STEMI, not sure if heparin level therapeutic at that time but anyways he was put on ASA and plavix since he had left ICA stenting due to >80% stenosis. His EF is also low at 35%. He has severe CAD with multiple vessel stenosis but deemed not a good candidate for CABG. Currently on medical management. Pt is doing well from neuro prospective. According to original plan, he should be transition to anticoagulation at this time. Will initiate eliquis 2.5mg  bid due to age and CKD with Cre around 1.5. Due to CAD will change ASA to 81mg  and stop plavix. Continue lipitor. Test BMP to assess kidney function today.  Plan:  - start eliquis for anticoagulation due to afib. Due to age and CKD, will start at 2.5mg  bid. Check BMP today to assess kidney function - decrease ASA 325 to 81 for CAD  - d/c plavix - continue lipitor for stroke and cardiac prevention - follow up with Dr. Andrey CampanileWilson and Dr. Gery PrayBarry closely - watch for any sign of bleeding - RTC in 2 months.  Diagnoses from this visit: Chronic atrial fibrillation - Plan: aspirin EC 81 MG tablet, apixaban (ELIQUIS) 2.5 MG TABS tablet  Cerebral infarction due to embolism of cerebral artery - Plan: Lipid panel, Hemoglobin A1c, Basic metabolic panel, CBC With differential/Platelet, aspirin EC 81 MG tablet,  apixaban (ELIQUIS) 2.5 MG TABS tablet  Orders Placed This Encounter  Procedures  . Lipid panel  . Hemoglobin A1c  . Basic metabolic panel  . CBC With differential/Platelet   Meds ordered this encounter  Medications  . aspirin EC 81 MG tablet    Sig: Take 1 tablet (81 mg total) by mouth daily.    Dispense:  90 tablet    Refill:  3  . apixaban (ELIQUIS) 2.5 MG TABS tablet    Sig: Take 1 tablet (2.5 mg total)  by mouth 2 (two) times daily.    Dispense:  60 tablet    Refill:  3   Patient Instructions  1. Will stop plavix and change ASA from 325mg  to 81mg  2. Start eliquis 2.5mg  twice a day, please do not missing doses 3. Continue lipitor for stroke prevention and HLD 4. Continue other medications 5. Monitor BP at home 6. Low fat and low sodium diet 7. Follow up with Dr. Gery Pray and Dr. Andrey Campanile closely 8. Watch for any sign of bleeding and take precautions to avoid trauma 9. Follow up in clinic in 2 months. 10. Will check some blood test today.   Marvel Plan, MD PhD [facility] [address] [place], [place] [address] [phone]

## 2013-09-09 ENCOUNTER — Other Ambulatory Visit (INDEPENDENT_AMBULATORY_CARE_PROVIDER_SITE_OTHER): Payer: Self-pay

## 2013-09-09 ENCOUNTER — Telehealth: Payer: Self-pay | Admitting: Neurology

## 2013-09-09 DIAGNOSIS — Z0289 Encounter for other administrative examinations: Secondary | ICD-10-CM

## 2013-09-09 NOTE — Telephone Encounter (Signed)
All clinical info has been provided to ins.  Request is currently under review.   

## 2013-09-09 NOTE — Telephone Encounter (Signed)
Patient's daughter calling to state that they were told by [facility] pharmacy that patient's Eliquis script needs prior authorization. If questions, please call.

## 2013-09-10 LAB — BASIC METABOLIC PANEL
BUN / CREAT RATIO: 13 (ref 10–22)
BUN: 21 mg/dL (ref 8–27)
CALCIUM: 9.2 mg/dL (ref 8.6–10.2)
CO2: 23 mmol/L (ref 18–29)
CREATININE: 1.56 mg/dL — AB (ref 0.76–1.27)
Chloride: 96 mmol/L — ABNORMAL LOW (ref 97–108)
GFR calc Af Amer: 46 mL/min/{1.73_m2} — ABNORMAL LOW (ref >59)
GFR, EST NON AFRICAN AMERICAN: 40 mL/min/{1.73_m2} — AB (ref >59)
Glucose: 92 mg/dL (ref 65–99)
Potassium: 4.8 mmol/L (ref 3.5–5.2)
Sodium: 134 mmol/L (ref 134–144)

## 2013-09-10 LAB — CBC WITH DIFFERENTIAL
Basophils Absolute: 0 10*3/uL (ref 0.0–0.2)
Basos: 0 %
EOS: 7 %
Eosinophils Absolute: 0.6 10*3/uL — ABNORMAL HIGH (ref 0.0–0.4)
HCT: 34.6 % — ABNORMAL LOW (ref 37.5–51.0)
HEMOGLOBIN: 11.8 g/dL — AB (ref 12.6–17.7)
IMMATURE GRANS (ABS): 0 10*3/uL (ref 0.0–0.1)
Immature Granulocytes: 0 %
LYMPHS: 20 %
Lymphocytes Absolute: 1.5 10*3/uL (ref 0.7–3.1)
MCH: 30.4 pg (ref 26.6–33.0)
MCHC: 34.1 g/dL (ref 31.5–35.7)
MCV: 89 fL (ref 79–97)
MONOCYTES: 7 %
Monocytes Absolute: 0.5 10*3/uL (ref 0.1–0.9)
NEUTROS PCT: 66 %
Neutrophils Absolute: 5 10*3/uL (ref 1.4–7.0)
Platelets: 238 10*3/uL (ref 150–379)
RBC: 3.88 x10E6/uL — AB (ref 4.14–5.80)
RDW: 15.7 % — ABNORMAL HIGH (ref 12.3–15.4)
WBC: 7.6 10*3/uL (ref 3.4–10.8)

## 2013-09-10 LAB — HEMOGLOBIN A1C
ESTIMATED AVERAGE GLUCOSE: 143 mg/dL
Hgb A1c MFr Bld: 6.6 % — ABNORMAL HIGH (ref 4.8–5.6)

## 2013-09-10 LAB — LIPID PANEL
CHOLESTEROL TOTAL: 122 mg/dL (ref 100–199)
Chol/HDL Ratio: 3.5 ratio units (ref 0.0–5.0)
HDL: 35 mg/dL — AB (ref >39)
LDL Calculated: 50 mg/dL (ref 0–99)
Triglycerides: 186 mg/dL — ABNORMAL HIGH (ref 0–149)
VLDL Cholesterol Cal: 37 mg/dL (ref 5–40)

## 2013-09-11 ENCOUNTER — Telehealth: Payer: Self-pay

## 2013-09-11 NOTE — Telephone Encounter (Signed)
[employer] sent [place] notification they have approval our request for coverage on Eliquis effective until 09/10/2014 Ref # [id number]

## 2013-09-28 ENCOUNTER — Emergency Department (HOSPITAL_COMMUNITY): Payer: PRIVATE HEALTH INSURANCE

## 2013-09-28 ENCOUNTER — Emergency Department (HOSPITAL_COMMUNITY)
Admission: EM | Admit: 2013-09-28 | Discharge: 2013-09-28 | Disposition: A | Discharge status: Home / Self Care | Payer: PRIVATE HEALTH INSURANCE | Attending: Emergency Medicine | Admitting: Emergency Medicine

## 2013-09-28 ENCOUNTER — Encounter (HOSPITAL_COMMUNITY): Payer: Self-pay | Admitting: Emergency Medicine

## 2013-09-28 DIAGNOSIS — I10 Essential (primary) hypertension: Secondary | ICD-10-CM | POA: Diagnosis not present

## 2013-09-28 DIAGNOSIS — R21 Rash and other nonspecific skin eruption: Secondary | ICD-10-CM | POA: Insufficient documentation

## 2013-09-28 DIAGNOSIS — Z7902 Long term (current) use of antithrombotics/antiplatelets: Secondary | ICD-10-CM | POA: Diagnosis not present

## 2013-09-28 DIAGNOSIS — M109 Gout, unspecified: Secondary | ICD-10-CM | POA: Diagnosis not present

## 2013-09-28 DIAGNOSIS — Z79899 Other long term (current) drug therapy: Secondary | ICD-10-CM | POA: Diagnosis not present

## 2013-09-28 DIAGNOSIS — R011 Cardiac murmur, unspecified: Secondary | ICD-10-CM | POA: Diagnosis not present

## 2013-09-28 DIAGNOSIS — I5021 Acute systolic (congestive) heart failure: Secondary | ICD-10-CM | POA: Diagnosis not present

## 2013-09-28 DIAGNOSIS — Z8669 Personal history of other diseases of the nervous system and sense organs: Secondary | ICD-10-CM | POA: Insufficient documentation

## 2013-09-28 DIAGNOSIS — Z87891 Personal history of nicotine dependence: Secondary | ICD-10-CM | POA: Insufficient documentation

## 2013-09-28 DIAGNOSIS — Z87448 Personal history of other diseases of urinary system: Secondary | ICD-10-CM | POA: Diagnosis not present

## 2013-09-28 DIAGNOSIS — Z8719 Personal history of other diseases of the digestive system: Secondary | ICD-10-CM | POA: Insufficient documentation

## 2013-09-28 DIAGNOSIS — R0609 Other forms of dyspnea: Secondary | ICD-10-CM | POA: Diagnosis not present

## 2013-09-28 DIAGNOSIS — R0989 Other specified symptoms and signs involving the circulatory and respiratory systems: Secondary | ICD-10-CM | POA: Insufficient documentation

## 2013-09-28 DIAGNOSIS — R0789 Other chest pain: Secondary | ICD-10-CM | POA: Insufficient documentation

## 2013-09-28 DIAGNOSIS — Z862 Personal history of diseases of the blood and blood-forming organs and certain disorders involving the immune mechanism: Secondary | ICD-10-CM | POA: Diagnosis not present

## 2013-09-28 DIAGNOSIS — J45901 Unspecified asthma with (acute) exacerbation: Secondary | ICD-10-CM | POA: Diagnosis not present

## 2013-09-28 DIAGNOSIS — I2510 Atherosclerotic heart disease of native coronary artery without angina pectoris: Secondary | ICD-10-CM | POA: Diagnosis not present

## 2013-09-28 DIAGNOSIS — Z8659 Personal history of other mental and behavioral disorders: Secondary | ICD-10-CM | POA: Insufficient documentation

## 2013-09-28 DIAGNOSIS — Z8619 Personal history of other infectious and parasitic diseases: Secondary | ICD-10-CM | POA: Diagnosis not present

## 2013-09-28 DIAGNOSIS — R42 Dizziness and giddiness: Secondary | ICD-10-CM | POA: Insufficient documentation

## 2013-09-28 DIAGNOSIS — R0600 Dyspnea, unspecified: Secondary | ICD-10-CM

## 2013-09-28 DIAGNOSIS — Z7982 Long term (current) use of aspirin: Secondary | ICD-10-CM | POA: Insufficient documentation

## 2013-09-28 LAB — CBC WITH DIFFERENTIAL/PLATELET
Basophils Absolute: 0 10*3/uL (ref 0.0–0.1)
Basophils Relative: 0 % (ref 0–1)
Eosinophils Absolute: 0.4 10*3/uL (ref 0.0–0.7)
Eosinophils Relative: 4 % (ref 0–5)
HEMATOCRIT: 31.8 % — AB (ref 39.0–52.0)
HEMOGLOBIN: 10.7 g/dL — AB (ref 13.0–17.0)
LYMPHS ABS: 1.2 10*3/uL (ref 0.7–4.0)
LYMPHS PCT: 13 % (ref 12–46)
MCH: 30.4 pg (ref 26.0–34.0)
MCHC: 33.6 g/dL (ref 30.0–36.0)
MCV: 90.3 fL (ref 78.0–100.0)
MONO ABS: 0.6 10*3/uL (ref 0.1–1.0)
MONOS PCT: 7 % (ref 3–12)
NEUTROS ABS: 7.1 10*3/uL (ref 1.7–7.7)
Neutrophils Relative %: 76 % (ref 43–77)
Platelets: 199 10*3/uL (ref 150–400)
RBC: 3.52 MIL/uL — AB (ref 4.22–5.81)
RDW: 14.4 % (ref 11.5–15.5)
WBC: 9.2 10*3/uL (ref 4.0–10.5)

## 2013-09-28 LAB — COMPREHENSIVE METABOLIC PANEL
ALT: 11 U/L (ref 0–53)
AST: 18 U/L (ref 0–37)
Albumin: 3.7 g/dL (ref 3.5–5.2)
Alkaline Phosphatase: 79 U/L (ref 39–117)
Anion gap: 13 (ref 5–15)
BILIRUBIN TOTAL: 1 mg/dL (ref 0.3–1.2)
BUN: 19 mg/dL (ref 6–23)
CHLORIDE: 93 meq/L — AB (ref 96–112)
CO2: 22 meq/L (ref 19–32)
CREATININE: 1.62 mg/dL — AB (ref 0.50–1.35)
Calcium: 9.1 mg/dL (ref 8.4–10.5)
GFR calc Af Amer: 43 mL/min — ABNORMAL LOW (ref >90)
GFR, EST NON AFRICAN AMERICAN: 37 mL/min — AB (ref >90)
GLUCOSE: 105 mg/dL — AB (ref 70–99)
Potassium: 4.6 mEq/L (ref 3.7–5.3)
Sodium: 128 mEq/L — ABNORMAL LOW (ref 137–147)
Total Protein: 7.2 g/dL (ref 6.0–8.3)

## 2013-09-28 LAB — URINALYSIS, ROUTINE W REFLEX MICROSCOPIC
BILIRUBIN URINE: NEGATIVE
Glucose, UA: NEGATIVE mg/dL
HGB URINE DIPSTICK: NEGATIVE
Ketones, ur: NEGATIVE mg/dL
Leukocytes, UA: NEGATIVE
Nitrite: NEGATIVE
PH: 7 (ref 5.0–8.0)
Protein, ur: NEGATIVE mg/dL
Specific Gravity, Urine: 1.008 (ref 1.005–1.030)
UROBILINOGEN UA: 1 mg/dL (ref 0.0–1.0)

## 2013-09-28 LAB — TROPONIN I: Troponin I: <0.3 ng/mL (ref <0.30)

## 2013-09-28 LAB — D-DIMER, QUANTITATIVE: D-Dimer, Quant: 2.32 ug/mL-FEU — ABNORMAL HIGH (ref 0.00–0.48)

## 2013-09-28 LAB — PRO B NATRIURETIC PEPTIDE: Pro B Natriuretic peptide (BNP): 475.8 pg/mL — ABNORMAL HIGH (ref 0–450)

## 2013-09-28 MED ORDER — RIVAROXABAN 15 MG PO TABS: 15.0000 mg | ORAL_TABLET | Freq: Two times a day (BID) | ORAL | Status: DC

## 2013-09-28 MED ORDER — FUROSEMIDE 40 MG PO TABS: 40.0000 mg | ORAL_TABLET | Freq: Every day | ORAL | Status: DC

## 2013-09-28 MED ORDER — ALBUTEROL (5 MG/ML) CONTINUOUS INHALATION SOLN
10.0000 mg/h | INHALATION_SOLUTION | Freq: Once | RESPIRATORY_TRACT | Status: AC
  Administered 2013-09-28: 10 mg/h via RESPIRATORY_TRACT
  Filled 2013-09-28: qty 20

## 2013-09-28 MED ORDER — DIPHENHYDRAMINE HCL 25 MG PO CAPS: 25.0000 mg | ORAL_CAPSULE | Freq: Four times a day (QID) | ORAL | Status: DC | PRN

## 2013-09-28 MED ORDER — METHYLPREDNISOLONE SODIUM SUCC 125 MG IJ SOLR
125.0000 mg | Freq: Once | INTRAMUSCULAR | Status: AC
  Administered 2013-09-28: 125 mg via INTRAVENOUS
  Filled 2013-09-28: qty 2

## 2013-09-28 MED ORDER — TECHNETIUM TO 99M ALBUMIN AGGREGATED
6.0000 | Freq: Once | INTRAVENOUS | Status: AC | PRN
  Administered 2013-09-28: 6 via INTRAVENOUS

## 2013-09-28 MED ORDER — TECHNETIUM TC 99M DIETHYLENETRIAME-PENTAACETIC ACID: 40.0000 | Freq: Once | INTRAVENOUS | Status: DC | PRN

## 2013-09-28 MED ORDER — FUROSEMIDE 20 MG PO TABS
40.0000 mg | ORAL_TABLET | Freq: Once | ORAL | Status: AC
  Administered 2013-09-28: 40 mg via ORAL
  Filled 2013-09-28: qty 2

## 2013-09-28 MED ORDER — SODIUM CHLORIDE 0.9 % IV BOLUS (SEPSIS): 1000.0000 mL | Freq: Once | INTRAVENOUS | Status: DC

## 2013-09-28 MED ORDER — IPRATROPIUM BROMIDE 0.02 % IN SOLN
0.5000 mg | Freq: Once | RESPIRATORY_TRACT | Status: AC
  Administered 2013-09-28: 0.5 mg via RESPIRATORY_TRACT
  Filled 2013-09-28: qty 2.5

## 2013-09-28 NOTE — Discharge Instructions (Signed)
Please return to the Er if the symptoms get worse. Take the lasix as prescribed. See the outpatient doctors, including cardiology, as soon as possible, ideally pcp in 2 days.  Heart Failure Heart failure is a condition in which the heart has trouble pumping blood. This means your heart does not pump blood efficiently for your body to work well. In some cases of heart failure, fluid may back up into your lungs or you may have swelling (edema) in your lower legs. Heart failure is usually a long-term (chronic) condition. It is important for you to take good care of yourself and follow your health care provider's treatment plan. CAUSES  Some health conditions can cause heart failure. Those health conditions include:  High blood pressure (hypertension). Hypertension causes the heart muscle to work harder than normal. When pressure in the blood vessels is high, the heart needs to pump (contract) with more force in order to circulate blood throughout the body. High blood pressure eventually causes the heart to become stiff and weak.  Coronary artery disease (CAD). CAD is the buildup of cholesterol and fat (plaque) in the arteries of the heart. The blockage in the arteries deprives the heart muscle of oxygen and blood. This can cause chest pain and may lead to a heart attack. High blood pressure can also contribute to CAD.  Heart attack (myocardial infarction). A heart attack occurs when one or more arteries in the heart become blocked. The loss of oxygen damages the muscle tissue of the heart. When this happens, part of the heart muscle dies. The injured tissue does not contract as well and weakens the heart's ability to pump blood.  Abnormal heart valves. When the heart valves do not open and close properly, it can cause heart failure. This makes the heart muscle pump harder to keep the blood flowing.  Heart muscle disease (cardiomyopathy or myocarditis). Heart muscle disease is damage to the heart muscle  from a variety of causes. These can include drug or alcohol abuse, infections, or unknown reasons. These can increase the risk of heart failure.  Lung disease. Lung disease makes the heart work harder because the lungs do not work properly. This can cause a strain on the heart, leading it to fail.  Diabetes. Diabetes increases the risk of heart failure. High blood sugar contributes to high fat (lipid) levels in the blood. Diabetes can also cause slow damage to tiny blood vessels that carry important nutrients to the heart muscle. When the heart does not get enough oxygen and food, it can cause the heart to become weak and stiff. This leads to a heart that does not contract efficiently.  Other conditions can contribute to heart failure. These include abnormal heart rhythms, thyroid problems, and low blood counts (anemia). Certain unhealthy behaviors can increase the risk of heart failure, including:  Being overweight.  Smoking or chewing tobacco.  Eating foods high in fat and cholesterol.  Abusing illicit drugs or alcohol.  Lacking physical activity. SYMPTOMS  Heart failure symptoms may vary and can be hard to detect. Symptoms may include:  Shortness of breath with activity, such as climbing stairs.  Persistent cough.  Swelling of the feet, ankles, legs, or abdomen.  Unexplained weight gain.  Difficulty breathing when lying flat (orthopnea).  Waking from sleep because of the need to sit up and get more air.  Rapid heartbeat.  Fatigue and loss of energy.  Feeling light-headed, dizzy, or close to fainting.  Loss of appetite.  Nausea.  Increased  urination during the night (nocturia). DIAGNOSIS  A diagnosis of heart failure is based on your history, symptoms, physical examination, and diagnostic tests. Diagnostic tests for heart failure may include:  Echocardiography.  Electrocardiography.  Chest X-ray.  Blood tests.  Exercise stress test.  Cardiac  angiography.  Radionuclide scans. TREATMENT  Treatment is aimed at managing the symptoms of heart failure. Medicines, behavioral changes, or surgical intervention may be necessary to treat heart failure.  Medicines to help treat heart failure may include:  Angiotensin-converting enzyme (ACE) inhibitors. This type of medicine blocks the effects of a blood protein called angiotensin-converting enzyme. ACE inhibitors relax (dilate) the blood vessels and help lower blood pressure.  Angiotensin receptor blockers (ARBs). This type of medicine blocks the actions of a blood protein called angiotensin. Angiotensin receptor blockers dilate the blood vessels and help lower blood pressure.  Water pills (diuretics). Diuretics cause the kidneys to remove salt and water from the blood. The extra fluid is removed through urination. This loss of extra fluid lowers the volume of blood the heart pumps.  Beta blockers. These prevent the heart from beating too fast and improve heart muscle strength.  Digitalis. This increases the force of the heartbeat.  Healthy behavior changes include:  Obtaining and maintaining a healthy weight.  Stopping smoking or chewing tobacco.  Eating heart-healthy foods.  Limiting or avoiding alcohol.  Stopping illicit drug use.  Physical activity as directed by your health care provider.  Surgical treatment for heart failure may include:  A procedure to open blocked arteries, repair damaged heart valves, or remove damaged heart muscle tissue.  A pacemaker to improve heart muscle function and control certain abnormal heart rhythms.  An internal cardioverter defibrillator to treat certain serious abnormal heart rhythms.  A left ventricular assist device (LVAD) to assist the pumping ability of the heart. HOME CARE INSTRUCTIONS   Take medicines only as directed by your health care provider. Medicines are important in reducing the workload of your heart, slowing the  progression of heart failure, and improving your symptoms.  Do not stop taking your medicine unless directed by your health care provider.  Do not skip any dose of medicine.  Refill your prescriptions before you run out of medicine. Your medicines are needed every day.  Engage in moderate physical activity if directed by your health care provider. Moderate physical activity can benefit some people. The elderly and people with severe heart failure should consult with a health care provider for physical activity recommendations.  Eat heart-healthy foods. Food choices should be free of trans fat and low in saturated fat, cholesterol, and salt (sodium). Healthy choices include fresh or frozen fruits and vegetables, fish, lean meats, legumes, fat-free or low-fat dairy products, and whole grain or high fiber foods. Talk to a dietitian to learn more about heart-healthy foods.  Limit sodium if directed by your health care provider. Sodium restriction may reduce symptoms of heart failure in some people. Talk to a dietitian to learn more about heart-healthy seasonings.  Use healthy cooking methods. Healthy cooking methods include roasting, grilling, broiling, baking, poaching, steaming, or stir-frying. Talk to a dietitian to learn more about healthy cooking methods.  Limit fluids if directed by your health care provider. Fluid restriction may reduce symptoms of heart failure in some people.  Weigh yourself every day. Daily weights are important in the early recognition of excess fluid. You should weigh yourself every morning after you urinate and before you eat breakfast. Wear the same amount of clothing  each time you weigh yourself. Record your daily weight. Provide your health care provider with your weight record.  Monitor and record your blood pressure if directed by your health care provider.  Check your pulse if directed by your health care provider.  Lose weight if directed by your health care  provider. Weight loss may reduce symptoms of heart failure in some people.  Stop smoking or chewing tobacco. Nicotine makes your heart work harder by causing your blood vessels to constrict. Do not use nicotine gum or patches before talking to your health care provider.  Keep all follow-up visits as directed by your health care provider. This is important.  Limit alcohol intake to no more than 1 drink per day for nonpregnant women and 2 drinks per day for men. One drink equals 12 ounces of beer, 5 ounces of wine, or 1 ounces of hard liquor. Drinking more than that is harmful to your heart. Tell your health care provider if you drink alcohol several times a week. Talk with your health care provider about whether alcohol is safe for you. If your heart has already been damaged by alcohol or you have severe heart failure, drinking alcohol should be stopped completely.  Stop illicit drug use.  Stay up-to-date with immunizations. It is especially important to prevent respiratory infections through current pneumococcal and influenza immunizations.  Manage other health conditions such as hypertension, diabetes, thyroid disease, or abnormal heart rhythms as directed by your health care provider.  Learn to manage stress.  Plan rest periods when fatigued.  Learn strategies to manage high temperatures. If the weather is extremely hot:  Avoid vigorous physical activity.  Use air conditioning or fans or seek a cooler location.  Avoid caffeine and alcohol.  Wear loose-fitting, lightweight, and light-colored clothing.  Learn strategies to manage cold temperatures. If the weather is extremely cold:  Avoid vigorous physical activity.  Layer clothes.  Wear mittens or gloves, a hat, and a scarf when going outside.  Avoid alcohol.  Obtain ongoing education and support as needed.  Participate in or seek rehabilitation as needed to maintain or improve independence and quality of life. SEEK MEDICAL  CARE IF:   Your weight increases by 03 lb/1.4 kg in 1 day or 05 lb/2.3 kg in a week.  You have increasing shortness of breath that is unusual for you.  You are unable to participate in your usual physical activities.  You tire easily.  You cough more than normal, especially with physical activity.  You have any or more swelling in areas such as your hands, feet, ankles, or abdomen.  You are unable to sleep because it is hard to breathe.  You feel like your heart is beating fast (palpitations).  You become dizzy or light-headed upon standing up. SEEK IMMEDIATE MEDICAL CARE IF:   You have difficulty breathing.  There is a change in mental status such as decreased alertness or difficulty with concentration.  You have a pain or discomfort in your chest.  You have an episode of fainting (syncope). MAKE SURE YOU:   Understand these instructions.  Will watch your condition.  Will get help right away if you are not doing well or get worse. Document Released: 01/27/2005 Document Revised: 06/13/2013 Document Reviewed: 02/27/2012 [facility] Patient Information 2015 [employer] [place]. This information is not intended to replace advice given to you by your health care provider. Make sure you discuss any questions you have with your health care provider.

## 2013-09-28 NOTE — ED Notes (Signed)
Pt speaks [place].

## 2013-09-28 NOTE — ED Notes (Signed)
The patient's daughter said the patient just started taking Eliquis and he is having an allergic reaction to the medication.    The daughter said the hives started this morning.  The patient said he "is having trouble catching his breath".  The daughter said he has been taking the Eliquis for about a week and he has been itching for several days.  She denies any new perfumes, lotions, or soaps.  She thinks it is due to the medication.

## 2013-09-28 NOTE — ED Provider Notes (Signed)
CSN: [id number]     Arrival date & time 09/28/13  0056 History   First MD Initiated Contact with Patient 09/28/13 0154     Chief Complaint  Patient presents with  . Allergic Reaction    The patient's daughter said the patient just started taking Elaquis and he is having an allergic reaction to the medication.       (Consider location/radiation/quality/duration/timing/severity/associated sxs/prior Treatment) HPI Comments: Pt is a 80 y/o male with hx of HTN, AAA, CHF Anemia/PUD, AKI. Pt comes in with DIB. Pt has been started on eliquis recently. He broke out into a rash this AM. Pt has been having dib for the past few days, mostly exertional. Last night however, his breathing got acutely worse. Pt feels like it is hard for him to catch breath. Pt has some chest tightness. He has some PND like sx. PT is not on any lasix, and has never been on it. No lung disease, no hx of DVT, PE and no risk factors for the same. No cough.  Patient is a 80 y.o. male presenting with allergic reaction. The history is provided by a relative. The history is limited by a language barrier.  Allergic Reaction Presenting symptoms: rash and wheezing     Past Medical History  Diagnosis Date  . Hypertension   . Gout   . Hard of hearing   . Anemia   . AAA (abdominal aortic aneurysm)   . Depression   . Hyperplasia, prostate   . Asthma   . H. pylori infection   . PUD (peptic ulcer disease)   . Hiatal hernia   . Renal failure, acute 11/08/2012  . Carotid artery disease   . Coronary artery disease    Past Surgical History  Procedure Laterality Date  . Esophagogastroduodenoscopy  06/13/2008,12/31/12   Family History  Problem Relation Age of Onset  . Colon cancer Neg Hx    History  Substance Use Topics  . Smoking status: Former Smoker    Quit date: 02/17/2002  . Smokeless tobacco: Never Used     Comment: Quit seven years ago   . Alcohol Use: No    Review of Systems  Constitutional: Negative for fever,  chills and activity change.  Eyes: Negative for visual disturbance.  Respiratory: Positive for chest tightness, shortness of breath and wheezing. Negative for cough.   Cardiovascular: Negative for chest pain.  Gastrointestinal: Negative for abdominal distention.  Genitourinary: Negative for dysuria, enuresis and difficulty urinating.  Musculoskeletal: Negative for arthralgias and neck pain.  Skin: Positive for rash.  Neurological: Positive for dizziness. Negative for light-headedness and headaches.  Psychiatric/Behavioral: Negative for confusion.      Allergies  Phenazopyridine hcl and Septra  Home Medications   Prior to Admission medications   Medication Sig Start Date End Date Taking? Authorizing Provider  allopurinol (ZYLOPRIM) 300 MG tablet Take 300 mg by mouth daily.   Yes Historical Provider, MD  apixaban (ELIQUIS) 2.5 MG TABS tablet Take 1 tablet (2.5 mg total) by mouth 2 (two) times daily. 09/08/13  Yes Marvel Plan, MD  aspirin EC 81 MG tablet Take 1 tablet (81 mg total) by mouth daily. 09/08/13  Yes Marvel Plan, MD  atorvastatin (LIPITOR) 40 MG tablet Take 1 tablet (40 mg total) by mouth daily at 6 PM. 05/11/13  Yes Brittainy Sharol Harness, PA-C  carvedilol (COREG) 3.125 MG tablet Take 1 tablet (3.125 mg total) by mouth 2 (two) times daily with a meal. 05/11/13  Yes Robbie Lis, PA-C  clopidogrel (PLAVIX) 75 MG tablet Take 1 tablet (75 mg total) by mouth daily with breakfast. 05/11/13  Yes Brittainy Simmons, PA-C  nitroGLYCERIN (NITROSTAT) 0.4 MG SL tablet Place 1 tablet (0.4 mg total) under the tongue every 5 (five) minutes x 3 doses as needed for chest pain. 05/11/13  Yes Brittainy Simmons, PA-C  pantoprazole (PROTONIX) 40 MG tablet Take 1 tablet (40 mg total) by mouth daily. 05/11/13  Yes Brittainy Simmons, PA-C   BP 134/72  Pulse 98  Resp 20  SpO2 100% Physical Exam  Nursing note and vitals reviewed. Constitutional: He is oriented to person, place, and time. He appears  well-developed.  HENT:  Head: Normocephalic and atraumatic.  Eyes: Conjunctivae and EOM are normal. Pupils are equal, round, and reactive to light.  Neck: Normal range of motion. Neck supple. No JVD present.  Cardiovascular: Normal rate and regular rhythm.   Murmur heard. Pulmonary/Chest: Effort normal. He has wheezes. He has no rales.  Abdominal: Soft. Bowel sounds are normal. He exhibits no distension. There is no tenderness. There is no rebound and no guarding.  Neurological: He is alert and oriented to person, place, and time.  Skin: Skin is warm.    ED Course  Procedures (including critical care time) Labs Review Labs Reviewed  CBC WITH DIFFERENTIAL - Abnormal; Notable for the following:    RBC 3.52 (*)    Hemoglobin 10.7 (*)    HCT 31.8 (*)    All other components within normal limits  COMPREHENSIVE METABOLIC PANEL - Abnormal; Notable for the following:    Sodium 128 (*)    Chloride 93 (*)    Glucose, Bld 105 (*)    Creatinine, Ser 1.62 (*)    GFR calc non Af Amer 37 (*)    GFR calc Af Amer 43 (*)    All other components within normal limits  PRO B NATRIURETIC PEPTIDE - Abnormal; Notable for the following:    Pro B Natriuretic peptide (BNP) 475.8 (*)    All other components within normal limits  D-DIMER, QUANTITATIVE - Abnormal; Notable for the following:    D-Dimer, Quant 2.32 (*)    All other components within normal limits  TROPONIN I  URINALYSIS, ROUTINE W REFLEX MICROSCOPIC  TROPONIN I    Imaging Review Dg Chest Port 1 View  09/28/2013   CLINICAL DATA:  Allergic reaction.  EXAM: PORTABLE CHEST - 1 VIEW  COMPARISON:  05/04/2013  FINDINGS: Mild cardiomegaly which is stable from previous. Negative upper mediastinal contours. Mild pulmonary hyperinflation which is chronic. There is no edema, consolidation, effusion, or pneumothorax.  IMPRESSION: No active disease.   Electronically Signed   By: Tiburcio Pea M.D.   On: 09/28/2013 02:26     EKG  Interpretation   Date/Time:  Wednesday September 28 2013 01:53:17 EDT Ventricular Rate:  84 PR Interval:  238 QRS Duration: 104 QT Interval:  388 QTC Calculation: 458 R Axis:   57 Text Interpretation:  Sinus rhythm with 1st degree A-V block Nonspecific T  wave abnormality Abnormal ECG Confirmed by Rhunette Croft, MD, Fabiha Rougeau ([id number]) on  09/28/2013 3:47:13 AM      MDM   Final diagnoses:  None    Pt comes in with cc of DIB.  Differential diagnosis includes: ACS syndrome CHF exacerbation Valvular disorder Pericardial effusion Pneumonia Pleural effusion Pulmonary edema PE Anemia Musculoskeletal pain  Pt comes in with cc of dib. Has no chest pain, no pulm dz. Pt has hx of CAD - being medically managed,  and CHF, with EF of 35%. Pt is not on lasix, and my initial impression was that patient had CHF exacerbation and pulm edema. However, his CXR is not truly indicative of pulmonary edema. BNP is slight elevated only.  Pt given breathing tx and dimer ordered.  On reassessment at 7, pt feels a lot better. I wanted patient to stay in the ER, as this could be angina equivalent, and also a mild CHF exacerbation - but patient feels a lot better, and daughter communicates that with the CAD, there is no intervention availabe anyways. They will wait for serial troponin, and r/o PE with the VQ scan.  I have asked them to see their cardiologist in 1 week, and pcp in 2-3 days. I will message Dr. Jens Somrenshaw. I will d.c patient with lasix 20 mg bid x 7 days. Return precautions discussed.  Derwood KaplanAnkit Zanobia Griebel, MD 09/28/13 [phone]

## 2013-09-28 NOTE — Progress Notes (Signed)
Pt Peak Flow: PEFR: 250 (41.4%) FEV1: 1.3   (63.5%)  Pt speaks [place], wife explained what to do, RT demonstrated. After multiple attempts pt gave good effort x2.  CAT started on patient following Peak Flow.

## 2013-09-28 NOTE — ED Notes (Signed)
Pt off the floor for testing

## 2013-09-28 NOTE — ED Notes (Addendum)
This RN attempted to speak with the pt via the translator phone ,to tell him that he was going for a CT scan, as family states that the pt speaks [place]. Pt did not understand the interpretor, and the interpretor stated that the pt's dialect sounded [place]. This RN then contacted a [place] interpretor, whom the pt did not understand either. This RN then called the pt's daughter who states that the pt does indeed speak [place], but is hard of hearing. [name] states that she is coming back to the hospital. This RN again tried to speak with the pt through the interpretor, but the pt again did not understand. This Rn to wait for family to get back to the pt's room to speak with the pt. CT aware of the delay.

## 2013-09-28 NOTE — ED Notes (Signed)
Family at bedside and has informed the pt of his CT scan. CT notified that the pt is ready.

## 2013-09-29 ENCOUNTER — Telehealth: Payer: Self-pay | Admitting: *Deleted

## 2013-09-29 NOTE — Telephone Encounter (Signed)
Called patient daughter to ask her did her father have any problems with Xarelto because for Eliquis they need a prior [place]. Asked her to give me a call back waiting on return call.

## 2013-09-30 NOTE — Telephone Encounter (Signed)
i have reached out to the patients daughter twice now . No one answered lvm asking her to give me a call to relay what Dr. Roda Shutters needs to know. Dr. Roda Shutters i have tried calling the patient daughter more than twice both phone went to voice mail.

## 2013-10-02 NOTE — Telephone Encounter (Signed)
Thank you. Just let them call back if they have problems.

## 2013-10-03 ENCOUNTER — Ambulatory Visit: Payer: PRIVATE HEALTH INSURANCE | Admitting: Cardiology

## 2013-10-14 ENCOUNTER — Encounter: Payer: Self-pay | Admitting: Physician Assistant

## 2013-10-14 ENCOUNTER — Ambulatory Visit (INDEPENDENT_AMBULATORY_CARE_PROVIDER_SITE_OTHER): Payer: PRIVATE HEALTH INSURANCE | Admitting: Physician Assistant

## 2013-10-14 ENCOUNTER — Other Ambulatory Visit: Payer: Self-pay | Admitting: *Deleted

## 2013-10-14 VITALS — BP 116/75 | HR 93 | Ht 61.0 in | Wt 142.5 lb

## 2013-10-14 DIAGNOSIS — I739 Peripheral vascular disease, unspecified: Secondary | ICD-10-CM

## 2013-10-14 DIAGNOSIS — E785 Hyperlipidemia, unspecified: Secondary | ICD-10-CM

## 2013-10-14 DIAGNOSIS — I25119 Atherosclerotic heart disease of native coronary artery with unspecified angina pectoris: Secondary | ICD-10-CM

## 2013-10-14 DIAGNOSIS — I2510 Atherosclerotic heart disease of native coronary artery without angina pectoris: Secondary | ICD-10-CM

## 2013-10-14 DIAGNOSIS — I4891 Unspecified atrial fibrillation: Secondary | ICD-10-CM

## 2013-10-14 DIAGNOSIS — I209 Angina pectoris, unspecified: Secondary | ICD-10-CM

## 2013-10-14 DIAGNOSIS — N183 Chronic kidney disease, stage 3 unspecified: Secondary | ICD-10-CM

## 2013-10-14 DIAGNOSIS — I480 Paroxysmal atrial fibrillation: Secondary | ICD-10-CM

## 2013-10-14 DIAGNOSIS — I5022 Chronic systolic (congestive) heart failure: Secondary | ICD-10-CM | POA: Insufficient documentation

## 2013-10-14 MED ORDER — ISOSORBIDE MONONITRATE 15 MG HALF TABLET: 30.0000 mg | ORAL_TABLET | Freq: Every day | ORAL | Status: DC

## 2013-10-14 MED ORDER — ISOSORBIDE MONONITRATE ER 30 MG PO TB24: 15.0000 mg | ORAL_TABLET | Freq: Every day | ORAL | Status: DC

## 2013-10-14 NOTE — Assessment & Plan Note (Signed)
On statin.   Repeat lipid panel in July 2015 showed LDL has dropped from 144 to 50. His total cholesterol was 121 triglycerides 186 HDL 35

## 2013-10-14 NOTE — Assessment & Plan Note (Signed)
Patent carotid stent a recent Doppler study

## 2013-10-14 NOTE — Assessment & Plan Note (Signed)
Patient is on medical therapy for his coronary disease. He complained of having chest tightness last week or couple days. His wife gave him one sublingual nitroglycerin which appeared to help. He's been asymptomatic since. Started Imdur 15 mg daily and will titrate as needed.

## 2013-10-14 NOTE — Assessment & Plan Note (Signed)
Patient appears euvolemic. Continue 40 mg Lasix daily.

## 2013-10-14 NOTE — Progress Notes (Signed)
Date:  10/14/2013   ID:  [name], DOB 08/15/1926, MRN [id number]  PCP:  [name], MD  Primary Cardiologist:  Allyson Sabal    History of Present Illness: [name] is a 80 y.o. male married [place] male admitted for congestive heart failure. He was found to be in atrial fibrillation and apparently converted to sinus rhythm. The echocardiogram revealed severe dysfunction with an ejection fraction in the 35% range. He had stroke like symptoms and was evaluated by the stroke service. An MRI suggested multiple embolic strokes possibly related to the atial fibrillation.carotid Dopplers were performed and suggested high-grade left internal carotid artery stenosis with string sign". The patient underwent cardiac catheterization by Dr. [place] the right radial approach that revealed 80% left main stenosis with an occluded dominant RCA.. Consensus was to treat him medically for this. Dr Allyson Sabal saw the patient for consideration of left internal carotid artery stenting. Extensive conversation was had with the patient and his family and after careful consideration of the risks and benefits of medical therapy versus percutaneous intervention, it was decided to proceed with carotid artery stenting.this was performed on 04/28/13 successfully.   Carotid Dopplers in May 2015 showed patent Left ICA stent.  He presents for 6 month evaluation. Reports last week he had 2- 3 days of angina. His wife gave him a sublingual nitroglycerin which appears to have helped. He had no recurrence since.   The patient currently denies nausea, vomiting, fever, shortness of breath, orthopnea, dizziness, PND, cough, congestion, abdominal pain, hematochezia, melena, lower extremity edema.  Weight has decreased 6 pounds since July 30.  Wt Readings from Last 3 Encounters:  10/14/13 142 lb 8 oz (64.638 kg)  09/08/13 148 lb 6.4 oz (67.314 kg)  06/08/13 147 lb (66.679 kg)     Past Medical History  Diagnosis Date  .  Hypertension   . Gout   . Hard of hearing   . Anemia   . AAA (abdominal aortic aneurysm)   . Depression   . Hyperplasia, prostate   . Asthma   . H. pylori infection   . PUD (peptic ulcer disease)   . Hiatal hernia   . Renal failure, acute 11/08/2012  . Carotid artery disease   . Coronary artery disease     Current Outpatient Prescriptions  Medication Sig Dispense Refill  . allopurinol (ZYLOPRIM) 300 MG tablet Take 300 mg by mouth daily.      [name] aspirin EC 81 MG tablet Take 1 tablet (81 mg total) by mouth daily.  90 tablet  3  . atorvastatin (LIPITOR) 40 MG tablet Take 1 tablet (40 mg total) by mouth daily at 6 PM.  30 tablet  5  . carvedilol (COREG) 3.125 MG tablet Take 1 tablet (3.125 mg total) by mouth 2 (two) times daily with a meal.  60 tablet  5  . diphenhydrAMINE (BENADRYL) 25 mg capsule Take 1 capsule (25 mg total) by mouth every 6 (six) hours as needed for itching.  15 capsule  0  . furosemide (LASIX) 40 MG tablet Take 1 tablet (40 mg total) by mouth daily.  15 tablet  0  . ipratropium-albuterol (DUONEB) 0.5-2.5 (3) MG/3ML SOLN 3 mLs.       . isosorbide mononitrate (IMDUR) 30 MG 24 hr tablet Take 0.5 tablets (15 mg total) by mouth daily.  15 tablet  3  . nitroGLYCERIN (NITROSTAT) 0.4 MG SL tablet Place 1 tablet (0.4 mg total) under the tongue every 5 (five) minutes x 3  doses as needed for chest pain.  25 tablet  5  . pantoprazole (PROTONIX) 40 MG tablet Take 1 tablet (40 mg total) by mouth daily.  40 tablet  5  . Rivaroxaban (XARELTO) 15 MG TABS tablet Take 1 tablet (15 mg total) by mouth 2 (two) times daily with a meal.  30 tablet  0  . tamsulosin (FLOMAX) 0.4 MG CAPS capsule Take 0.4 mg by mouth daily.       . [DISCONTINUED] apixaban (ELIQUIS) 2.5 MG TABS tablet Take 1 tablet (2.5 mg total) by mouth 2 (two) times daily.  60 tablet  3   No current facility-administered medications for this visit.    Allergies:    Allergies  Allergen Reactions  . Phenazopyridine Hcl Other  (See Comments)    unknown  . Septra [Sulfamethoxazole-Tmp Ds] Other (See Comments)    unknown    Social History:  The patient  reports that he quit smoking about 11 years ago. He has never used smokeless tobacco. He reports that he does not drink alcohol or use illicit drugs.   Family history:   Family History  Problem Relation Age of Onset  . Colon cancer Neg Hx     ROS:  Please see the history of present illness.  All other systems reviewed and negative.   PHYSICAL EXAM: VS:  BP 116/75  Pulse 93  Ht 5\' 1"  (1.549 m)  Wt 142 lb 8 oz (64.638 kg)  BMI 26.94 kg/m2 Well nourished, well developed, in no acute distress HEENT: Pupils are equal round react to light accommodation extraocular movements are intact.  Neck: no JVDNo cervical lymphadenopathy. No carotid bruit Cardiac: Regular rate and rhythm without murmurs rubs or gallops. Lungs:  clear to auscultation bilaterally, no wheezing, rhonchi or rales Abd: soft, nontender, positive bowel sounds all quadrants, no hepatosplenomegaly Ext: trace lower extremity edema.  2+ radial and dorsalis pedis pulses. Skin: warm and dry Neuro:  Grossly normal  EKG:  None  ASSESSMENT AND PLAN:  Problem List Items Addressed This Visit   Chronic renal insufficiency, stage III (moderate) (Chronic)   PVD (peripheral vascular disease) 3.5cm AAA Aug 2014 (Chronic)     Patent carotid stent a recent Doppler study    Relevant Medications      isosorbide mononitrate (IMDUR) 24 hr tablet   Atrial fibrillation- unknown duration     Rate is controlled by exam. He was changed from eliquis to xarelto.  He did have recent CVA we will discontinue Plavix when starts the Xarelto.      Relevant Medications      isosorbide mononitrate (IMDUR) 24 hr tablet   Hyperlipidemia     On statin.   Repeat lipid panel in July 2015 showed LDL has dropped from 144 to 50. His total cholesterol was 121 triglycerides 186 HDL 35    Relevant Medications      isosorbide  mononitrate (IMDUR) 24 hr tablet   Coronary artery disease - Primary     Patient is on medical therapy for his coronary disease. He complained of having chest tightness last week or couple days. His wife gave him one sublingual nitroglycerin which appeared to help. He's been asymptomatic since. Started Imdur 15 mg daily and will titrate as needed.    Relevant Medications      isosorbide mononitrate (IMDUR) 24 hr tablet   Chronic systolic heart failure, echocardiogram March 2015: ejection fraction 35%,      Patient appears euvolemic. Continue 40 mg Lasix daily.  Relevant Medications      isosorbide mononitrate (IMDUR) 24 hr tablet

## 2013-10-14 NOTE — Assessment & Plan Note (Signed)
Rate is controlled by exam. He was changed from eliquis to xarelto.  He did have recent CVA we will discontinue Plavix when starts the Xarelto.

## 2013-10-14 NOTE — Patient Instructions (Addendum)
  1.  Start Imdur 15mg  daily.  This is a long acting nitroglycerin. 2.  Follow up with Dr. Allyson Sabal in 3 months.

## 2013-11-16 ENCOUNTER — Encounter (HOSPITAL_COMMUNITY): Payer: Self-pay | Admitting: Emergency Medicine

## 2013-11-16 ENCOUNTER — Ambulatory Visit: Payer: PRIVATE HEALTH INSURANCE | Admitting: Neurology

## 2013-11-16 ENCOUNTER — Emergency Department (HOSPITAL_COMMUNITY): Payer: PRIVATE HEALTH INSURANCE

## 2013-11-16 ENCOUNTER — Inpatient Hospital Stay (HOSPITAL_COMMUNITY)
Admission: EM | Admit: 2013-11-16 | Discharge: 2013-11-19 | DRG: 392 | Disposition: A | Discharge status: Home / Self Care | Payer: PRIVATE HEALTH INSURANCE | Attending: Internal Medicine | Admitting: Internal Medicine

## 2013-11-16 DIAGNOSIS — N183 Chronic kidney disease, stage 3 unspecified: Secondary | ICD-10-CM | POA: Diagnosis present

## 2013-11-16 DIAGNOSIS — Z87891 Personal history of nicotine dependence: Secondary | ICD-10-CM | POA: Diagnosis not present

## 2013-11-16 DIAGNOSIS — K219 Gastro-esophageal reflux disease without esophagitis: Secondary | ICD-10-CM

## 2013-11-16 DIAGNOSIS — F329 Major depressive disorder, single episode, unspecified: Secondary | ICD-10-CM | POA: Diagnosis present

## 2013-11-16 DIAGNOSIS — J45909 Unspecified asthma, uncomplicated: Secondary | ICD-10-CM | POA: Diagnosis present

## 2013-11-16 DIAGNOSIS — E785 Hyperlipidemia, unspecified: Secondary | ICD-10-CM

## 2013-11-16 DIAGNOSIS — Z7982 Long term (current) use of aspirin: Secondary | ICD-10-CM

## 2013-11-16 DIAGNOSIS — I480 Paroxysmal atrial fibrillation: Secondary | ICD-10-CM

## 2013-11-16 DIAGNOSIS — L509 Urticaria, unspecified: Secondary | ICD-10-CM

## 2013-11-16 DIAGNOSIS — R197 Diarrhea, unspecified: Secondary | ICD-10-CM

## 2013-11-16 DIAGNOSIS — I482 Chronic atrial fibrillation, unspecified: Secondary | ICD-10-CM

## 2013-11-16 DIAGNOSIS — I5022 Chronic systolic (congestive) heart failure: Secondary | ICD-10-CM

## 2013-11-16 DIAGNOSIS — I2583 Coronary atherosclerosis due to lipid rich plaque: Secondary | ICD-10-CM

## 2013-11-16 DIAGNOSIS — I739 Peripheral vascular disease, unspecified: Secondary | ICD-10-CM | POA: Diagnosis present

## 2013-11-16 DIAGNOSIS — I129 Hypertensive chronic kidney disease with stage 1 through stage 4 chronic kidney disease, or unspecified chronic kidney disease: Secondary | ICD-10-CM | POA: Diagnosis present

## 2013-11-16 DIAGNOSIS — R079 Chest pain, unspecified: Secondary | ICD-10-CM | POA: Diagnosis present

## 2013-11-16 DIAGNOSIS — H9190 Unspecified hearing loss, unspecified ear: Secondary | ICD-10-CM | POA: Diagnosis present

## 2013-11-16 DIAGNOSIS — R0789 Other chest pain: Secondary | ICD-10-CM

## 2013-11-16 DIAGNOSIS — Z8673 Personal history of transient ischemic attack (TIA), and cerebral infarction without residual deficits: Secondary | ICD-10-CM | POA: Diagnosis not present

## 2013-11-16 DIAGNOSIS — R519 Headache, unspecified: Secondary | ICD-10-CM

## 2013-11-16 DIAGNOSIS — A084 Viral intestinal infection, unspecified: Principal | ICD-10-CM | POA: Diagnosis present

## 2013-11-16 DIAGNOSIS — Z7901 Long term (current) use of anticoagulants: Secondary | ICD-10-CM

## 2013-11-16 DIAGNOSIS — I2510 Atherosclerotic heart disease of native coronary artery without angina pectoris: Secondary | ICD-10-CM | POA: Diagnosis present

## 2013-11-16 DIAGNOSIS — I252 Old myocardial infarction: Secondary | ICD-10-CM

## 2013-11-16 DIAGNOSIS — Z8711 Personal history of peptic ulcer disease: Secondary | ICD-10-CM | POA: Diagnosis not present

## 2013-11-16 DIAGNOSIS — Z955 Presence of coronary angioplasty implant and graft: Secondary | ICD-10-CM | POA: Diagnosis not present

## 2013-11-16 DIAGNOSIS — R112 Nausea with vomiting, unspecified: Secondary | ICD-10-CM

## 2013-11-16 DIAGNOSIS — R0600 Dyspnea, unspecified: Secondary | ICD-10-CM | POA: Diagnosis present

## 2013-11-16 DIAGNOSIS — N189 Chronic kidney disease, unspecified: Secondary | ICD-10-CM

## 2013-11-16 DIAGNOSIS — M542 Cervicalgia: Secondary | ICD-10-CM

## 2013-11-16 DIAGNOSIS — I255 Ischemic cardiomyopathy: Secondary | ICD-10-CM

## 2013-11-16 DIAGNOSIS — I779 Disorder of arteries and arterioles, unspecified: Secondary | ICD-10-CM

## 2013-11-16 DIAGNOSIS — I25110 Atherosclerotic heart disease of native coronary artery with unstable angina pectoris: Secondary | ICD-10-CM

## 2013-11-16 DIAGNOSIS — R5383 Other fatigue: Secondary | ICD-10-CM

## 2013-11-16 DIAGNOSIS — R51 Headache: Secondary | ICD-10-CM

## 2013-11-16 DIAGNOSIS — R1110 Vomiting, unspecified: Secondary | ICD-10-CM

## 2013-11-16 DIAGNOSIS — N179 Acute kidney failure, unspecified: Secondary | ICD-10-CM | POA: Diagnosis present

## 2013-11-16 DIAGNOSIS — K921 Melena: Secondary | ICD-10-CM

## 2013-11-16 HISTORY — DX: Heart failure, unspecified: I50.9

## 2013-11-16 LAB — CBC WITH DIFFERENTIAL/PLATELET
Basophils Absolute: 0 10*3/uL (ref 0.0–0.1)
Basophils Relative: 0 % (ref 0–1)
EOS ABS: 0.2 10*3/uL (ref 0.0–0.7)
EOS PCT: 2 % (ref 0–5)
HEMATOCRIT: 33.9 % — AB (ref 39.0–52.0)
HEMOGLOBIN: 11.6 g/dL — AB (ref 13.0–17.0)
LYMPHS ABS: 1.2 10*3/uL (ref 0.7–4.0)
Lymphocytes Relative: 11 % — ABNORMAL LOW (ref 12–46)
MCH: 30.4 pg (ref 26.0–34.0)
MCHC: 34.2 g/dL (ref 30.0–36.0)
MCV: 88.7 fL (ref 78.0–100.0)
MONOS PCT: 5 % (ref 3–12)
Monocytes Absolute: 0.6 10*3/uL (ref 0.1–1.0)
Neutro Abs: 8.8 10*3/uL — ABNORMAL HIGH (ref 1.7–7.7)
Neutrophils Relative %: 82 % — ABNORMAL HIGH (ref 43–77)
Platelets: 243 10*3/uL (ref 150–400)
RBC: 3.82 MIL/uL — AB (ref 4.22–5.81)
RDW: 14 % (ref 11.5–15.5)
WBC: 10.7 10*3/uL — ABNORMAL HIGH (ref 4.0–10.5)

## 2013-11-16 LAB — URINALYSIS, ROUTINE W REFLEX MICROSCOPIC
Glucose, UA: NEGATIVE mg/dL
Hgb urine dipstick: NEGATIVE
KETONES UR: NEGATIVE mg/dL
LEUKOCYTES UA: NEGATIVE
NITRITE: NEGATIVE
Protein, ur: NEGATIVE mg/dL
Specific Gravity, Urine: 1.019 (ref 1.005–1.030)
Urobilinogen, UA: 0.2 mg/dL (ref 0.0–1.0)
pH: 6 (ref 5.0–8.0)

## 2013-11-16 LAB — COMPREHENSIVE METABOLIC PANEL
ALK PHOS: 77 U/L (ref 39–117)
ALT: 6 U/L (ref 0–53)
AST: 15 U/L (ref 0–37)
Albumin: 3.6 g/dL (ref 3.5–5.2)
Anion gap: 13 (ref 5–15)
BUN: 19 mg/dL (ref 6–23)
CALCIUM: 8.5 mg/dL (ref 8.4–10.5)
CO2: 21 mEq/L (ref 19–32)
Chloride: 102 mEq/L (ref 96–112)
Creatinine, Ser: 1.83 mg/dL — ABNORMAL HIGH (ref 0.50–1.35)
GFR, EST AFRICAN AMERICAN: 37 mL/min — AB (ref >90)
GFR, EST NON AFRICAN AMERICAN: 32 mL/min — AB (ref >90)
GLUCOSE: 115 mg/dL — AB (ref 70–99)
Potassium: 3.5 mEq/L — ABNORMAL LOW (ref 3.7–5.3)
SODIUM: 136 meq/L — AB (ref 137–147)
TOTAL PROTEIN: 7.3 g/dL (ref 6.0–8.3)
Total Bilirubin: 0.8 mg/dL (ref 0.3–1.2)

## 2013-11-16 LAB — LIPASE, BLOOD: Lipase: 31 U/L (ref 11–59)

## 2013-11-16 LAB — TROPONIN I
Troponin I: <0.3 ng/mL (ref <0.30)
Troponin I: <0.3 ng/mL (ref <0.30)
Troponin I: <0.3 ng/mL (ref <0.30)

## 2013-11-16 LAB — PRO B NATRIURETIC PEPTIDE: PRO B NATRI PEPTIDE: 360.9 pg/mL (ref 0–450)

## 2013-11-16 LAB — TSH: TSH: 1.28 u[IU]/mL (ref 0.350–4.500)

## 2013-11-16 LAB — MAGNESIUM: Magnesium: 1.7 mg/dL (ref 1.5–2.5)

## 2013-11-16 MED ORDER — PANTOPRAZOLE SODIUM 40 MG PO TBEC
40.0000 mg | DELAYED_RELEASE_TABLET | Freq: Every day | ORAL | Status: DC
  Administered 2013-11-16 – 2013-11-18 (×3): 40 mg via ORAL
  Filled 2013-11-16 (×3): qty 1

## 2013-11-16 MED ORDER — METOPROLOL TARTRATE 12.5 MG HALF TABLET
12.5000 mg | ORAL_TABLET | Freq: Two times a day (BID) | ORAL | Status: DC
  Filled 2013-11-16: qty 1

## 2013-11-16 MED ORDER — ASPIRIN 81 MG PO CHEW
324.0000 mg | CHEWABLE_TABLET | Freq: Once | ORAL | Status: AC
  Administered 2013-11-16: 324 mg via ORAL
  Filled 2013-11-16: qty 4

## 2013-11-16 MED ORDER — ALPRAZOLAM 0.25 MG PO TABS: 0.2500 mg | ORAL_TABLET | Freq: Two times a day (BID) | ORAL | Status: DC | PRN

## 2013-11-16 MED ORDER — ACETAMINOPHEN 325 MG PO TABS: 650.0000 mg | ORAL_TABLET | ORAL | Status: DC | PRN

## 2013-11-16 MED ORDER — FENTANYL CITRATE 0.05 MG/ML IJ SOLN
25.0000 ug | Freq: Once | INTRAMUSCULAR | Status: AC
  Administered 2013-11-16: 25 ug via INTRAVENOUS
  Filled 2013-11-16: qty 2

## 2013-11-16 MED ORDER — ASPIRIN 300 MG RE SUPP
300.0000 mg | RECTAL | Status: AC
  Filled 2013-11-16: qty 1

## 2013-11-16 MED ORDER — ISOSORBIDE MONONITRATE 15 MG HALF TABLET
15.0000 mg | ORAL_TABLET | Freq: Every day | ORAL | Status: DC
  Administered 2013-11-16 – 2013-11-19 (×4): 15 mg via ORAL
  Filled 2013-11-16 (×5): qty 1

## 2013-11-16 MED ORDER — IPRATROPIUM-ALBUTEROL 0.5-2.5 (3) MG/3ML IN SOLN
3.0000 mL | Freq: Two times a day (BID) | RESPIRATORY_TRACT | Status: DC
  Administered 2013-11-16 – 2013-11-19 (×6): 3 mL via RESPIRATORY_TRACT
  Filled 2013-11-16 (×6): qty 3

## 2013-11-16 MED ORDER — ASPIRIN 81 MG PO CHEW
324.0000 mg | CHEWABLE_TABLET | ORAL | Status: AC
  Administered 2013-11-16: 324 mg via ORAL
  Filled 2013-11-16: qty 4

## 2013-11-16 MED ORDER — CARVEDILOL 3.125 MG PO TABS
3.1250 mg | ORAL_TABLET | Freq: Two times a day (BID) | ORAL | Status: DC
  Administered 2013-11-16 – 2013-11-19 (×6): 3.125 mg via ORAL
  Filled 2013-11-16 (×9): qty 1

## 2013-11-16 MED ORDER — RIVAROXABAN 15 MG PO TABS
15.0000 mg | ORAL_TABLET | Freq: Every day | ORAL | Status: DC
  Administered 2013-11-17 – 2013-11-19 (×3): 15 mg via ORAL
  Filled 2013-11-16 (×5): qty 1

## 2013-11-16 MED ORDER — SODIUM CHLORIDE 0.9 % IV SOLN: INTRAVENOUS | Status: DC

## 2013-11-16 MED ORDER — PREDNISONE 20 MG PO TABS
40.0000 mg | ORAL_TABLET | Freq: Once | ORAL | Status: AC
  Administered 2013-11-16: 40 mg via ORAL
  Filled 2013-11-16: qty 2

## 2013-11-16 MED ORDER — ONDANSETRON HCL 4 MG/2ML IJ SOLN: 4.0000 mg | Freq: Four times a day (QID) | INTRAMUSCULAR | Status: DC | PRN

## 2013-11-16 MED ORDER — NITROGLYCERIN 0.4 MG SL SUBL: 0.4000 mg | SUBLINGUAL_TABLET | SUBLINGUAL | Status: DC | PRN

## 2013-11-16 MED ORDER — ASPIRIN EC 81 MG PO TBEC
81.0000 mg | DELAYED_RELEASE_TABLET | Freq: Every day | ORAL | Status: DC
Start: 2013-11-17 — End: 2013-11-16

## 2013-11-16 MED ORDER — ASPIRIN EC 81 MG PO TBEC
81.0000 mg | DELAYED_RELEASE_TABLET | Freq: Every day | ORAL | Status: DC
  Administered 2013-11-17 – 2013-11-19 (×3): 81 mg via ORAL
  Filled 2013-11-16 (×3): qty 1

## 2013-11-16 MED ORDER — DIPHENHYDRAMINE HCL 25 MG PO CAPS
25.0000 mg | ORAL_CAPSULE | Freq: Four times a day (QID) | ORAL | Status: DC | PRN
  Administered 2013-11-16: 25 mg via ORAL
  Filled 2013-11-16 (×2): qty 1

## 2013-11-16 MED ORDER — ENOXAPARIN SODIUM 40 MG/0.4ML ~~LOC~~ SOLN
40.0000 mg | SUBCUTANEOUS | Status: DC
  Filled 2013-11-16: qty 0.4

## 2013-11-16 MED ORDER — ATORVASTATIN CALCIUM 40 MG PO TABS
40.0000 mg | ORAL_TABLET | Freq: Every day | ORAL | Status: DC
  Administered 2013-11-16 – 2013-11-18 (×3): 40 mg via ORAL
  Filled 2013-11-16 (×4): qty 1

## 2013-11-16 MED ORDER — NITROGLYCERIN 0.4 MG SL SUBL
0.4000 mg | SUBLINGUAL_TABLET | SUBLINGUAL | Status: DC | PRN
Start: 2013-11-16 — End: 2013-11-16

## 2013-11-16 MED ORDER — TAMSULOSIN HCL 0.4 MG PO CAPS
0.4000 mg | ORAL_CAPSULE | Freq: Every day | ORAL | Status: DC
  Administered 2013-11-16 – 2013-11-19 (×4): 0.4 mg via ORAL
  Filled 2013-11-16 (×4): qty 1

## 2013-11-16 NOTE — H&P (Signed)
[facility] History and Physical  Roddy Laidig HEK:[id number] DOB: February 10, 1927 DOA: 11/16/2013  Referring physician: EDP PCP: [name], MD   Chief Complaint: multiple complaints  HPI: [name] is a 80 y.o. male with PMH of severe CAD, P.afib, ICM-EF of 35%, CKD 3, h/o carotid artery stent in 3/15 presents to the ER with multiple complaints. History is provided by his daughter who serves as Equities trader. He was reportedly in his baseline state of health until 2-3 days ago, when he started having vomiting, non bilious, non bloodly and several episodes of diarrhea, this appears to be resolving none today. In addition also reports intermittent ongoing chest pain/epigastric pain and dyspnea on exertion. No fevers or chills. Also noted raised urticarial rash on neck and limbs No new med changes recently in last 2-3weeks, no sick contacts, no recent travel, no Abx use    Review of Systems: positives bolded Constitutional:  No weight loss, night sweats, Fevers, chills, fatigue.  HEENT:  No headaches, Difficulty swallowing,Tooth/dental problems,Sore throat,  No sneezing, itching, ear ache, nasal congestion, post nasal drip,  Cardio-vascular:   chest pain, Orthopnea, PND, swelling in lower extremities, anasarca, dizziness, palpitations  GI:  No heartburn, indigestion, abdominal pain, nausea, vomiting, diarrhea, change in bowel habits, loss of appetite  Resp:  No shortness of breath with exertion or at rest. No excess mucus, no productive cough, No non-productive cough, No coughing up of blood.No change in color of mucus.No wheezing.No chest wall deformity  Skin:  no rash or lesions.  GU:  no dysuria, change in color of urine, no urgency or frequency. No flank pain.  Musculoskeletal:  No joint pain or swelling. No decreased range of motion. No back pain.  Psych:  No change in mood or affect. No depression or anxiety. No memory loss.   Past Medical History    Diagnosis Date  . Hypertension   . Gout   . Hard of hearing   . Anemia   . AAA (abdominal aortic aneurysm)   . Depression   . Hyperplasia, prostate   . Asthma   . H. pylori infection   . PUD (peptic ulcer disease)   . Hiatal hernia   . Renal failure, acute 11/08/2012  . Carotid artery disease   . Coronary artery disease   . CHF (congestive heart failure)    Past Surgical History  Procedure Laterality Date  . Esophagogastroduodenoscopy  06/13/2008,12/31/12   Social History:  reports that he quit smoking about 11 years ago. He has never used smokeless tobacco. He reports that he does not drink alcohol or use illicit drugs.  Allergies  Allergen Reactions  . Phenazopyridine Hcl Other (See Comments)    Unknown reaction  . Septra [Sulfamethoxazole-Tmp Ds] Nausea And Vomiting    GI Upset    Family History  Problem Relation Age of Onset  . Colon cancer Neg Hx      Prior to Admission medications   Medication Sig Start Date End Date Taking? Authorizing Provider  allopurinol (ZYLOPRIM) 300 MG tablet Take 300 mg by mouth daily.   Yes Historical Provider, MD  aspirin EC 81 MG tablet Take 1 tablet (81 mg total) by mouth daily. 09/08/13  Yes Marvel Plan, MD  atorvastatin (LIPITOR) 40 MG tablet Take 1 tablet (40 mg total) by mouth daily at 6 PM. 05/11/13  Yes Brittainy Sherlynn Carbon, PA-C  carvedilol (COREG) 3.125 MG tablet Take 1 tablet (3.125 mg total) by mouth 2 (two) times daily with a meal. 05/11/13  Yes Brittainy Sherlynn CarbonM Simmons, PA-C  furosemide (LASIX) 40 MG tablet Take 1 tablet (40 mg total) by mouth daily. 09/28/13  Yes Ankit Nanavati, MD  ipratropium-albuterol (DUONEB) 0.5-2.5 (3) MG/3ML SOLN Take 3 mLs by nebulization 2 (two) times daily.  09/30/13  Yes Historical Provider, MD  isosorbide mononitrate (IMDUR) 30 MG 24 hr tablet Take 0.5 tablets (15 mg total) by mouth daily. 10/14/13  Yes Dwana MelenaBryan W Hager, PA-C  nitroGLYCERIN (NITROSTAT) 0.4 MG SL tablet Place 1 tablet (0.4 mg total) under the tongue  every 5 (five) minutes x 3 doses as needed for chest pain. 05/11/13  Yes Brittainy Sherlynn CarbonM Simmons, PA-C  pantoprazole (PROTONIX) 40 MG tablet Take 1 tablet (40 mg total) by mouth daily. 05/11/13  Yes Brittainy Sherlynn CarbonM Simmons, PA-C  Rivaroxaban (XARELTO) 15 MG TABS tablet Take 15 mg by mouth daily.   Yes Historical Provider, MD  tamsulosin (FLOMAX) 0.4 MG CAPS capsule Take 0.4 mg by mouth daily.   Yes Historical Provider, MD   Physical Exam: Filed Vitals:   11/16/13 0945 11/16/13 1000 11/16/13 1029 11/16/13 1031  BP: 94/55 111/70 98/55   Pulse: 80 80    Temp:    97.9 F (36.6 C)  TempSrc:    Oral  Resp: 11 21    Height:      Weight:      SpO2: 96% 98%      Wt Readings from Last 3 Encounters:  11/16/13 65.772 kg (145 lb)  10/14/13 64.638 kg (142 lb 8 oz)  09/08/13 67.314 kg (148 lb 6.4 oz)    General:  Appears calm and comfortable, chronically ill appearing Eyes: PERRL, normal lids, irises & conjunctiva ENT: grossly normal lips & tongue Neck: no LAD, masses or thyromegaly Cardiovascular: RRR, no m/r/g. No LE edema. Respiratory: CTA bilaterally, no w/r/r. Normal respiratory effort. Abdomen: soft, ntnd Skin: scattered urticarial rash noted on legs, neck, arms etc Musculoskeletal: grossly normal tone BUE/BLE Psychiatric: grossly normal mood and affect, speech fluent and appropriate Neurologic: grossly non-focal.          Labs on Admission:  Basic Metabolic Panel:  Recent Labs Lab 11/16/13 0708  NA 136*  K 3.5*  CL 102  CO2 21  GLUCOSE 115*  BUN 19  CREATININE 1.83*  CALCIUM 8.5   Liver Function Tests:  Recent Labs Lab 11/16/13 0708  AST 15  ALT 6  ALKPHOS 77  BILITOT 0.8  PROT 7.3  ALBUMIN 3.6    Recent Labs Lab 11/16/13 0800  LIPASE 31   No results found for this basename: AMMONIA,  in the last 168 hours CBC:  Recent Labs Lab 11/16/13 0708  WBC 10.7*  NEUTROABS 8.8*  HGB 11.6*  HCT 33.9*  MCV 88.7  PLT 243   Cardiac Enzymes:  Recent Labs Lab  11/16/13 0800  TROPONINI <0.30    BNP (last 3 results)  Recent Labs  04/28/13 1050 09/28/13 0155 11/16/13 0800  PROBNP 2543.0* 475.8* 360.9   CBG: No results found for this basename: GLUCAP,  in the last 168 hours  Radiological Exams on Admission: Dg Chest 2 View  11/16/2013   CLINICAL DATA:  Initial encounter for urticaria, emesis and fatigue.  EXAM: CHEST  2 VIEW  COMPARISON:  05/04/2013  FINDINGS: The lung volumes are low. Normal heart size. Calcified atherosclerotic plaque involves the aortic arch. No pleural effusions or edema. No airspace consolidation identified. Multi level degenerative disc disease noted within the thoracic spine.  IMPRESSION: 1. No acute cardiopulmonary abnormalities noted.  2. Low lung volumes 3. Atherosclerotic disease   Electronically Signed   By: Signa Kell M.D.   On: 11/16/2013 08:24   Ct Head Wo Contrast  11/16/2013   CLINICAL DATA:  Nausea, vomiting, and itchy rash and hives this week ; no history of fever chills; history of hypertension, peripheral vascular disease, and coronary artery disease.  EXAM: CT HEAD WITHOUT CONTRAST  TECHNIQUE: Contiguous axial images were obtained from the base of the skull through the vertex without intravenous contrast.  COMPARISON:  Noncontrast CT scan of the brain of May 01, 2013, and an MRI of the brain of May 03, 2013  FINDINGS: There is mild diffuse cerebral atrophy with compensatory ventriculomegaly. There is no intracranial hemorrhage nor intracranial mass effect. There is stable encephalomalacia in the high posterior left frontal lobe and in the anterior para median portion of the left frontal lobe. There is no acute ischemic change. The cerebellum and brainstem are unremarkable.  The observed paranasal sinuses and mastoid air cells exhibit no air-fluid levels. There is opacification of a few left mastoid air cells. There is no acute skull fracture. There is decreased density in the deep white matter of both  cerebral hemispheres consistent with chronic small vessel ischemia.  IMPRESSION: 1. There is no acute intracranial hemorrhage nor evidence of acute ischemic change. There areas of left frontal encephalomalacia and there are stable changes of chronic small vessel ischemia. 2. There is no hydrocephalus nor intracranial mass effect.   Electronically Signed   By: David  [place]   On: 11/16/2013 08:17    EKG: Independently reviewed. NSR, inferior Q waves noted  Assessment/Plan  1. Chest/Epigastric pain-known severe CAD -cycle cardiac enzymes -ASA -Cards following to decide regarding occluded RCA and ?8-% Left main dz  2. Vomiting and Diarrhea -improving, likely viral gastroenteritis -clears, Cdiff PCR -supportive care  3.   Neck pain on left side -L carotid stent per Dr.Berry 3/15  4. Ischemic cardiomyopathy/Chronic systolic heart failure, echocardiogram March 2015: ejection fraction 35%,  -euvolemic -lasix on hold, continue coreg  5. Paroxysmal atrial fibrillation  -rate controlled, continue Xarelto, coreg  6. CKD 3 -stable  7. Urticaria  -unclear etiology, No new meds in last few weeks -likely related to GI infection, monitor for now  Code Status: Presumed Full Code for now, Daughter unable to decide despite long discussion today, wants to think about it  DVT Prophylaxis:lovenox Family Communication: d/w daughter at bedside Disposition Plan: inpatient  Time spent:  [facility] Pager [phone]

## 2013-11-16 NOTE — Progress Notes (Signed)
11: 22 report received from Laurien.ED  RN  1134 to Rm 23 via stretcher with daughter in attendance as an inter preter Kept pt comfortable in bed Safety precaution observed

## 2013-11-16 NOTE — ED Notes (Signed)
Zavitz, MD informed of pts family request to take morning daily scheduled medications, verbal order that family can admin medication, family updated

## 2013-11-16 NOTE — Progress Notes (Signed)
Patient ID: [name], male   DOB: [birth year], 80 y.o.   MRN: [id number]  I've spoken with Dr. Allyson Sabal to get his input. I also spoke with Dr. Excell Seltzer. Dr. Excell Seltzer will review the patient's status at some time during this hospitalization to give input concerning whether high risk PCI with hemodynamic support could be considered in this patient if appropriate. As of today, we're still not sure if his symptoms are ischemic. If his troponins are positive, then it would seem reasonable to consider high-risk intervention if Dr. Excell Seltzer feels it is an option. If his troponins are negative, it may be most prudent to continue to follow him. It is difficult to know if nuclear stress study would help [place] in all in this situation. With his anatomy, he could potentially have global ischemia that could be difficult to assess by nuclear.  Jerral Bonito, MD

## 2013-11-16 NOTE — ED Notes (Signed)
Pt. reports nausea , vomitting , headache and itchy rashes/hives onset this week , denies fever or chills

## 2013-11-16 NOTE — ED Provider Notes (Signed)
CSN: [id number]     Arrival date & time 11/16/13  4098 History   First MD Initiated Contact with Patient 11/16/13 [id number]     Chief Complaint  Patient presents with  . Urticaria  . Emesis  . Fatigue     (Consider location/radiation/quality/duration/timing/severity/associated sxs/prior Treatment) HPI Comments: 80 year old male with history of high blood pressure, renal disease, vascular disease, abdominal aortic aneurysm 3.5 cm, atrial fibrillation, and STEMI, congestive heart failure, stroke, on Xarelto, carotid endarterectomy presents with chest tightness and left neck pain. Translation through family member who takes care of the patient and no specific timing to the tightness worse than normal. Patient's had intermittent left neck pain since his surgery in March. No significant diaphoresis or obvious exertional symptoms. Patient follows up outpatient by cardiology. At this time patient is being treated medically for coronary artery disease. Patient has multiple other symptoms including diarrhea and high-like rash on abdominal region and left thigh with no new meds or exposures known per caregiver. Mild difficulty due to language barrier. Patient also has nonspecific gradual onset headache generalized, no injuries recall. Patient has shortness of breath which is overall similar to previous and unsure if worsened with lying flat or exertion. Caregiver feels weight loss more than weight gain. Patient on Lasix daily 40 mg.  Patient is a 80 y.o. male presenting with urticaria and vomiting. The history is provided by the patient and a relative.  Urticaria Associated symptoms include headaches and shortness of breath. Pertinent negatives include no chest pain and no abdominal pain.  Emesis Associated symptoms: diarrhea and headaches   Associated symptoms: no abdominal pain and no chills     Past Medical History  Diagnosis Date  . Hypertension   . Gout   . Hard of hearing   . Anemia   . AAA  (abdominal aortic aneurysm)   . Depression   . Hyperplasia, prostate   . Asthma   . H. pylori infection   . PUD (peptic ulcer disease)   . Hiatal hernia   . Renal failure, acute 11/08/2012  . Carotid artery disease   . Coronary artery disease    Past Surgical History  Procedure Laterality Date  . Esophagogastroduodenoscopy  06/13/2008,12/31/12   Family History  Problem Relation Age of Onset  . Colon cancer Neg Hx    History  Substance Use Topics  . Smoking status: Former Smoker    Quit date: 02/17/2002  . Smokeless tobacco: Never Used     Comment: Quit seven years ago   . Alcohol Use: No    Review of Systems  Constitutional: Positive for fatigue. Negative for fever and chills.  HENT: Negative for congestion.   Eyes: Negative for visual disturbance.  Respiratory: Positive for cough and shortness of breath.   Cardiovascular: Negative for chest pain.  Gastrointestinal: Positive for nausea, vomiting and diarrhea. Negative for abdominal pain.  Genitourinary: Negative for dysuria and flank pain.  Musculoskeletal: Negative for back pain, neck pain and neck stiffness.  Skin: Positive for rash.  Neurological: Positive for light-headedness and headaches.      Allergies  Phenazopyridine hcl and Septra  Home Medications   Prior to Admission medications   Medication Sig Start Date End Date Taking? Authorizing Provider  allopurinol (ZYLOPRIM) 300 MG tablet Take 300 mg by mouth daily.    Historical Provider, MD  aspirin EC 81 MG tablet Take 1 tablet (81 mg total) by mouth daily. 09/08/13   Marvel Plan, MD  atorvastatin (LIPITOR) 40 MG tablet  Take 1 tablet (40 mg total) by mouth daily at 6 PM. 05/11/13   Brittainy M Sharol Harness, PA-C  carvedilol (COREG) 3.125 MG tablet Take 1 tablet (3.125 mg total) by mouth 2 (two) times daily with a meal. 05/11/13   Brittainy Sherlynn Carbon, PA-C  diphenhydrAMINE (BENADRYL) 25 mg capsule Take 1 capsule (25 mg total) by mouth every 6 (six) hours as needed for  itching. 09/28/13   Derwood Kaplan, MD  furosemide (LASIX) 40 MG tablet Take 1 tablet (40 mg total) by mouth daily. 09/28/13   Derwood Kaplan, MD  ipratropium-albuterol (DUONEB) 0.5-2.5 (3) MG/3ML SOLN 3 mLs.  09/30/13   Historical Provider, MD  isosorbide mononitrate (IMDUR) 30 MG 24 hr tablet Take 0.5 tablets (15 mg total) by mouth daily. 10/14/13   Dwana Melena, PA-C  nitroGLYCERIN (NITROSTAT) 0.4 MG SL tablet Place 1 tablet (0.4 mg total) under the tongue every 5 (five) minutes x 3 doses as needed for chest pain. 05/11/13   Brittainy Sherlynn Carbon, PA-C  pantoprazole (PROTONIX) 40 MG tablet Take 1 tablet (40 mg total) by mouth daily. 05/11/13   Brittainy Sherlynn Carbon, PA-C  Rivaroxaban (XARELTO) 15 MG TABS tablet Take 1 tablet (15 mg total) by mouth 2 (two) times daily with a meal. 09/28/13   Juliet Rude. Pickering, MD  tamsulosin (FLOMAX) 0.4 MG CAPS capsule Take 0.4 mg by mouth daily.  09/15/13   Historical Provider, MD   BP 102/69  Pulse 102  Temp(Src) 97.2 F (36.2 C) (Oral)  Resp 14  SpO2 99% Physical Exam  Nursing note and vitals reviewed. Constitutional: He is oriented to person, place, and time. He appears well-developed and well-nourished.  HENT:  Head: Normocephalic and atraumatic.  Mild dry mucous membranes  Eyes: Right eye exhibits no discharge. Left eye exhibits no discharge.  Neck: Normal range of motion. Neck supple. No tracheal deviation present.  Cardiovascular: Regular rhythm.  Tachycardia present.   Pectus excavatum Equal pulses bilateral upper extremities 2+  Pulmonary/Chest: Effort normal and breath sounds normal.  Abdominal: Soft. He exhibits no distension. There is no tenderness. There is no guarding.  Musculoskeletal: He exhibits no edema.  Neurological: He is alert and oriented to person, place, and time.  Patient moves extremities with equal 5+ bilateral, gross sensation intact bilateral, extra the muscle function intact, neck supple no meningismus  Skin: Skin is warm. No  petechiae and no rash noted. Rash is not pustular.  Hive-like rash anterior abdomen and left inner thigh  Psychiatric:  Patient appears to interact normally with caregivers in no signs of confusion per family. Difficulty due to language barrier.    ED Course  Procedures (including critical care time) Labs Review Labs Reviewed  CBC WITH DIFFERENTIAL - Abnormal; Notable for the following:    WBC 10.7 (*)    RBC 3.82 (*)    Hemoglobin 11.6 (*)    HCT 33.9 (*)    Neutrophils Relative % 82 (*)    Neutro Abs 8.8 (*)    Lymphocytes Relative 11 (*)    All other components within normal limits  COMPREHENSIVE METABOLIC PANEL - Abnormal; Notable for the following:    Sodium 136 (*)    Potassium 3.5 (*)    Glucose, Bld 115 (*)    Creatinine, Ser 1.83 (*)    GFR calc non Af Amer 32 (*)    GFR calc Af Amer 37 (*)    All other components within normal limits  URINE CULTURE  TROPONIN I  PRO  B NATRIURETIC PEPTIDE  LIPASE, BLOOD  URINALYSIS, ROUTINE W REFLEX MICROSCOPIC    Imaging Review Dg Chest 2 View  11/16/2013   CLINICAL DATA:  Initial encounter for urticaria, emesis and fatigue.  EXAM: CHEST  2 VIEW  COMPARISON:  05/04/2013  FINDINGS: The lung volumes are low. Normal heart size. Calcified atherosclerotic plaque involves the aortic arch. No pleural effusions or edema. No airspace consolidation identified. Multi level degenerative disc disease noted within the thoracic spine.  IMPRESSION: 1. No acute cardiopulmonary abnormalities noted. 2. Low lung volumes 3. Atherosclerotic disease   Electronically Signed   By: Signa Kell M.D.   On: 11/16/2013 08:24   Ct Head Wo Contrast  11/16/2013   CLINICAL DATA:  Nausea, vomiting, and itchy rash and hives this week ; no history of fever chills; history of hypertension, peripheral vascular disease, and coronary artery disease.  EXAM: CT HEAD WITHOUT CONTRAST  TECHNIQUE: Contiguous axial images were obtained from the base of the skull through the  vertex without intravenous contrast.  COMPARISON:  Noncontrast CT scan of the brain of May 01, 2013, and an MRI of the brain of May 03, 2013  FINDINGS: There is mild diffuse cerebral atrophy with compensatory ventriculomegaly. There is no intracranial hemorrhage nor intracranial mass effect. There is stable encephalomalacia in the high posterior left frontal lobe and in the anterior para median portion of the left frontal lobe. There is no acute ischemic change. The cerebellum and brainstem are unremarkable.  The observed paranasal sinuses and mastoid air cells exhibit no air-fluid levels. There is opacification of a few left mastoid air cells. There is no acute skull fracture. There is decreased density in the deep white matter of both cerebral hemispheres consistent with chronic small vessel ischemia.  IMPRESSION: 1. There is no acute intracranial hemorrhage nor evidence of acute ischemic change. There areas of left frontal encephalomalacia and there are stable changes of chronic small vessel ischemia. 2. There is no hydrocephalus nor intracranial mass effect.   Electronically Signed   By: David  [place]   On: 11/16/2013 08:17     EKG Interpretation   Date/Time:  Wednesday November 16 2013 07:21:19 EDT Ventricular Rate:  90 PR Interval:  231 QRS Duration: 114 QT Interval:  400 QTC Calculation: 489 R Axis:   -43 Text Interpretation:  Sinus rhythm Prolonged PR interval Borderline IVCD  with LAD Abnormal inferior Q waves Borderline repolarization abnormality  Borderline prolonged QT interval Confirmed by Jodi Mourning  MD, Riki Gehring ([id number]) on  11/16/2013 7:42:16 AM      MDM   Final diagnoses:  Chest tightness  Urticaria  Other fatigue  Headache, unspecified headache type  CRF (chronic renal failure), unspecified stage   Patient with vascular history and known coronary artery disease presents with chest tightness neck pain and multiple other symptoms. Family says primary reason for visit is chest  tightness neck pain and lightheadedness. Plan for screening blood work, CT head with patient being on blood thinners and having persistent headache. Likely plan for observation. Cardiology consult at for chest tightness and possible worsening angina. Nitroglycerin and fentanyl as needed.  Cardiology evaluated this consult emergency department, final recommendations pending. Aspirin ordered for chest pain is improved in ER. Discussed the case with triad for telemetry admission for further evaluation of multiple other symptoms and primarily chest tightness.  The patients results and plan were reviewed and discussed.   Any x-rays performed were personally reviewed by myself.   Differential diagnosis were considered with the  presenting HPI.  Medications  nitroGLYCERIN (NITROSTAT) SL tablet 0.4 mg (not administered)  aspirin chewable tablet 324 mg (not administered)  fentaNYL (SUBLIMAZE) injection 25 mcg (25 mcg Intravenous Given 11/16/13 0755)  predniSONE (DELTASONE) tablet 40 mg (40 mg Oral Given 11/16/13 0756)    Filed Vitals:   11/16/13 0703 11/16/13 0730 11/16/13 0845 11/16/13 0915  BP: 102/69 104/54 99/55 100/49  Pulse: 102 91 75 81  Temp: 97.2 F (36.2 C)     TempSrc: Oral     Resp: 14 29 21 28   SpO2: 99% 97% 99% 100%    Final diagnoses:  Chest tightness  Urticaria  Other fatigue  Headache, unspecified headache type  CRF (chronic renal failure), unspecified stage    Admission/ observation were discussed with the admitting physician, patient and/or family and they are comfortable with the plan.    Enid SkeensJoshua M Brinn Westby, MD 11/16/13 [phone]

## 2013-11-16 NOTE — H&P (Signed)
CARDIOLOGY HISTORY AND PHYSICAL   Patient ID: [name] MRN: [id number]  DOB/AGE: [birth year] 80 y.o. Admit date: 11/16/2013  Primary Care Physician: Jonathon Hoit, MD Primary Cardiologist:   [name]  Clinical Summary [name] is a 80 y.o.male. He presents today to the emergency room with multiple complaints. He has known significant coronary disease. One of his current complaints is chest and throat discomfort. This may be his anginal symptom. He does not speak [place]. Communication is through a woman who is his caregiver. She is very attentive and caring. Presentation today includes diarrhea, vomiting, urticaria (treated in the emergency room) continued discomfort over the area of his left carotid, chest discomfort, throat discomfort, and headache. His first troponin is normal. His EKG shows inferior Q waves that were not present in the past. He was seen in the office by Jonathon Finlay, PA-C on October 14, 2013. There is an excellent complete note. He was concerned about chest discomfort at that time. His indoor dose was increased from 15-30 mg daily.  In the emergency room head CT showed no acute findings as part of evaluation for his headache. He received medications for urticaria that appears to be improving.   Allergies  Allergen Reactions  . Phenazopyridine Hcl Other (See Comments)    unknown  . Septra [Sulfamethoxazole-Tmp Ds] Other (See Comments)    unknown    Home Medications  (Not in a hospital admission)  Scheduled Medications     Infusions     PRN Medications  nitroGLYCERIN  Past Medical History  Diagnosis Date  . Hypertension   . Gout   . Hard of hearing   . Anemia   . AAA (abdominal aortic aneurysm)   . Depression   . Hyperplasia, prostate   . Asthma   . H. pylori infection   . PUD (peptic ulcer disease)   . Hiatal hernia   . Renal failure, acute 11/08/2012  . Carotid artery disease   . Coronary artery disease     Past  Surgical History  Procedure Laterality Date  . Esophagogastroduodenoscopy  06/13/2008,12/31/12    Family History  Problem Relation Age of Onset  . Colon cancer Neg Hx     Social History [name] reports that he quit smoking about 11 years ago. He has never used smokeless tobacco. [name] reports that he does not drink alcohol.  Review of Systems  Review of systems is obtained through the caregiver. He has not had fevers or chills. He has had headaches. There's been no change in his vision or hearing. He does mention some shortness of breath. He has had nausea and vomiting as outlined in the history of present illness. He's not having any urinary symptoms.  Physical Examination Temp:  [97.2 F (36.2 C)] 97.2 F (36.2 C) (10/07 0703) Pulse Rate:  [91-102] 91 (10/07 0730) Resp:  [14-29] 29 (10/07 0730) BP: (102-104)/(54-69) 104/54 mmHg (10/07 0730) SpO2:  [97 %-99 %] 97 % (10/07 0730) No intake or output data in the 24 hours ending 11/16/13 0914  He is lying flat in bed. Communication is through his caregiver. Head is atraumatic. There is no jugulovenous distention. Lungs reveal a few scattered rhonchi. Cardiac exam reveals S1 and S2. Abdomen is soft. There is no significant peripheral edema. The areas of urticaria on his abdomen and left thigh are improving. There no longer raised, but the red areas are still present.   Lab Results  Basic Metabolic Panel:  Recent Labs Lab  11/16/13 0708  NA 136*  K 3.5*  CL 102  CO2 21  GLUCOSE 115*  BUN 19  CREATININE 1.83*  CALCIUM 8.5    Liver Function Tests:  Recent Labs Lab 11/16/13 0708  AST 15  ALT 6  ALKPHOS 77  BILITOT 0.8  PROT 7.3  ALBUMIN 3.6    CBC:  Recent Labs Lab 11/16/13 0708  WBC 10.7*  NEUTROABS 8.8*  HGB 11.6*  HCT 33.9*  MCV 88.7  PLT 243    Cardiac Enzymes:  Recent Labs Lab 11/16/13 0800  TROPONINI <0.30    BNP: No components found with this basename: POCBNP,     Radiology Dg Chest 2 View  11/16/2013   CLINICAL DATA:  Initial encounter for urticaria, emesis and fatigue.  EXAM: CHEST  2 VIEW  COMPARISON:  05/04/2013  FINDINGS: The lung volumes are low. Normal heart size. Calcified atherosclerotic plaque involves the aortic arch. No pleural effusions or edema. No airspace consolidation identified. Multi level degenerative disc disease noted within the thoracic spine.  IMPRESSION: 1. No acute cardiopulmonary abnormalities noted. 2. Low lung volumes 3. Atherosclerotic disease   Electronically Signed   By: Signa Kellaylor  Stroud M.D.   On: 11/16/2013 08:24   Ct Head Wo Contrast  11/16/2013   CLINICAL DATA:  Nausea, vomiting, and itchy rash and hives this week ; no history of fever chills; history of hypertension, peripheral vascular disease, and coronary artery disease.  EXAM: CT HEAD WITHOUT CONTRAST  TECHNIQUE: Contiguous axial images were obtained from the base of the skull through the vertex without intravenous contrast.  COMPARISON:  Noncontrast CT scan of the brain of May 01, 2013, and an MRI of the brain of May 03, 2013  FINDINGS: There is mild diffuse cerebral atrophy with compensatory ventriculomegaly. There is no intracranial hemorrhage nor intracranial mass effect. There is stable encephalomalacia in the high posterior left frontal lobe and in the anterior para median portion of the left frontal lobe. There is no acute ischemic change. The cerebellum and brainstem are unremarkable.  The observed paranasal sinuses and mastoid air cells exhibit no air-fluid levels. There is opacification of a few left mastoid air cells. There is no acute skull fracture. There is decreased density in the deep white matter of both cerebral hemispheres consistent with chronic small vessel ischemia.  IMPRESSION: 1. There is no acute intracranial hemorrhage nor evidence of acute ischemic change. There areas of left frontal encephalomalacia and there are stable changes of chronic small  vessel ischemia. 2. There is no hydrocephalus nor intracranial mass effect.   Electronically Signed   By: David  [place]   On: 11/16/2013 08:17    Prior Cardiac Testing/Procedures:   ECG  I have reviewed current and old EKGs. There is no acute change. There are inferior Q waves that were not present on the prior tracing. Currently the rhythm is sinus.   Impression and Recommendations  At this time it is very difficult to know which of his problems is the primary problem. There are a multitude of issues as outlined below. The main reason prompting admission is the intermittent chest discomfort and throat discomfort that may be ischemic in origin.    Dyspnea    He has a complaint of some shortness of breath. Currently there is no definite evidence of CHF. His volume status appears to be stable.    Chronic renal insufficiency, stage III (moderate)     Creatinine today 1.8 is slightly higher than his last creatinine. We  will hold his Lasix for one day. We need to assess further whether he is really having significant diarrhea.    PVD (peripheral vascular disease) 3.5cm AAA Aug 2014     This issue has been stable.    Coronary artery disease     The patient has severe coronary artery disease. Catheterization in March, 2015 revealed total RCA. There is a high-grade left main lesion. In the report the suggestion is 60-70% left main. In other parts of his record there is mention of 80% left main. It was felt at the time of his catheterization that he was not a candidate for CABG based on his age and comorbidities. I do not know if consideration would be given to high risk left main intervention if it is felt that he is really having ongoing ischemic chest pain. So far his first troponin is normal. Further troponins will be checked. I've chosen not to increase his nitrate today because of his headaches.    Chronic systolic heart failure, echocardiogram March 2015: ejection fraction 35%,       His  volume status appears to be stable at this time today. No change in therapy.    Paroxysmal atrial fibrillation    By history the patient has paroxysmal atrial fibrillation. Currently they're sinus rhythm. It has been felt that he has had cerebral emboli related to this. Anticoagulation is very important. Currently he is anticoagulated with Xarelto. He's been on this medication for at least one month.    Bilateral carotid artery disease     The patient received a carotid stent by Dr. Allyson Sabal March, 2015. The procedure was successful.  The patient reports discomfort in the left neck in the area of the left carotid. There are no obvious findings on physical exam.    Diarrhea     The caregiver reports diarrhea. We will have to isolate him for this and send off appropriate labs.    Vomiting     The caregiver reports vomiting. At this time we will use liquids and follow him to see the course.    Urticaria     Etiology of urticaria is not clear. His most recent medication additions were Xarelto and increased indoor. These were started at least one month ago. No plan to change these meds at this time.    Ischemic cardiomyopathy     Patient has known ischemic cardiomyopathy. His medications cannot be adjusted any further at this time.    Headache     Head CT shows no acute abnormality. We will not increase his nitrates at this time because of his headache.    Chronic anticoagulation      Chronic anticoagulation will be continued with history of paroxysmal atrial fibrillation and probable history of involuntary events in the past.    Neck pain, left side    The caregiver reports that the patient has constant discomfort in the left neck in the area of the left carotid. There are no physical findings. I will ask Dr. Allyson Sabal if there is a syndrome of discomfort in this area after stenting.  As I mentioned at the beginning of this problem list, it is very difficult to know what his primary complaint is.  The plan will be to use clear liquids and watch his GI status to see if he has persisting vomiting or diarrhea and respond accordingly. We will follow his headache to see if it improves. I will put his allopurinol on hold. This is not a  new medication. However I feel we should hold this with his urticaria. Further cardiac enzymes will be checked. I will last doctor Allyson Sabal if he can give [place] any further insight as to how we might approach this patient's chest discomfort, knowing decisions had been made in the past.  Jerral Bonito, MD 11/16/2013, 9:14 AM

## 2013-11-16 NOTE — Progress Notes (Signed)
1530 pt has macular rashes  Sporadically spread out to both thighs more in the right , r lateral abdomen , l shoulder and neck areas. With orders . Informed the pt and son at the bedside.verbalized understanding

## 2013-11-17 ENCOUNTER — Ambulatory Visit: Payer: PRIVATE HEALTH INSURANCE | Admitting: Physician Assistant

## 2013-11-17 DIAGNOSIS — I25110 Atherosclerotic heart disease of native coronary artery with unstable angina pectoris: Secondary | ICD-10-CM

## 2013-11-17 DIAGNOSIS — I482 Chronic atrial fibrillation: Secondary | ICD-10-CM

## 2013-11-17 DIAGNOSIS — R197 Diarrhea, unspecified: Secondary | ICD-10-CM

## 2013-11-17 DIAGNOSIS — L509 Urticaria, unspecified: Secondary | ICD-10-CM

## 2013-11-17 DIAGNOSIS — M542 Cervicalgia: Secondary | ICD-10-CM

## 2013-11-17 DIAGNOSIS — I779 Disorder of arteries and arterioles, unspecified: Secondary | ICD-10-CM

## 2013-11-17 DIAGNOSIS — R112 Nausea with vomiting, unspecified: Secondary | ICD-10-CM

## 2013-11-17 LAB — HEMOGLOBIN A1C
HEMOGLOBIN A1C: 5.7 % — AB (ref <5.7)
Mean Plasma Glucose: 117 mg/dL — ABNORMAL HIGH (ref <117)

## 2013-11-17 LAB — URINE CULTURE
CULTURE: NO GROWTH
Colony Count: NO GROWTH

## 2013-11-17 LAB — BASIC METABOLIC PANEL
Anion gap: 16 — ABNORMAL HIGH (ref 5–15)
BUN: 24 mg/dL — ABNORMAL HIGH (ref 6–23)
CALCIUM: 8.4 mg/dL (ref 8.4–10.5)
CO2: 20 mEq/L (ref 19–32)
Chloride: 99 mEq/L (ref 96–112)
Creatinine, Ser: 1.87 mg/dL — ABNORMAL HIGH (ref 0.50–1.35)
GFR calc Af Amer: 36 mL/min — ABNORMAL LOW (ref >90)
GFR, EST NON AFRICAN AMERICAN: 31 mL/min — AB (ref >90)
Glucose, Bld: 201 mg/dL — ABNORMAL HIGH (ref 70–99)
Potassium: 3.9 mEq/L (ref 3.7–5.3)
SODIUM: 135 meq/L — AB (ref 137–147)

## 2013-11-17 LAB — LIPID PANEL
Cholesterol: 71 mg/dL (ref 0–200)
HDL: 29 mg/dL — ABNORMAL LOW (ref >39)
LDL Cholesterol: 27 mg/dL (ref 0–99)
Total CHOL/HDL Ratio: 2.4 RATIO
Triglycerides: 74 mg/dL (ref <150)
VLDL: 15 mg/dL (ref 0–40)

## 2013-11-17 LAB — CBC
HCT: 29.7 % — ABNORMAL LOW (ref 39.0–52.0)
Hemoglobin: 10.3 g/dL — ABNORMAL LOW (ref 13.0–17.0)
MCH: 30.3 pg (ref 26.0–34.0)
MCHC: 34.7 g/dL (ref 30.0–36.0)
MCV: 87.4 fL (ref 78.0–100.0)
PLATELETS: 248 10*3/uL (ref 150–400)
RBC: 3.4 MIL/uL — ABNORMAL LOW (ref 4.22–5.81)
RDW: 13.8 % (ref 11.5–15.5)
WBC: 8.7 10*3/uL (ref 4.0–10.5)

## 2013-11-17 LAB — TROPONIN I: Troponin I: <0.3 ng/mL (ref <0.30)

## 2013-11-17 MED ORDER — DIPHENHYDRAMINE HCL 25 MG PO CAPS
25.0000 mg | ORAL_CAPSULE | Freq: Two times a day (BID) | ORAL | Status: DC | PRN
  Administered 2013-11-17: 25 mg via ORAL

## 2013-11-17 NOTE — Progress Notes (Addendum)
Report given to receiving RN. Patient in bed resting with family at bedside. No verbal complaints and no signs or symptoms of distress or discomfort. 

## 2013-11-17 NOTE — Progress Notes (Signed)
Patient Profile: 80 y.o. [place] male with h/o CAD with 80% left main stenosis with an occluded dominant RCA on cath 05/04/13 with initial plans for medical therapy, PAF with h/o of CVA with MRI revealing multiple embolic strokes now on Xarelto, severe systolic dysfunction with EF of 35% and h/o left ICA disease, s/p stenting 04/28/13 with f/u dopplers 06/2013 demonstrating patent stent.   Admitted 10/7 for multiple complaints: chest pain/epigastric pain, neck pain, nausea, vomiting, diarrhea, headache and Urticaria.   Cardiac enzymes negative x 3.   Subjective: Unable to obtain subjective info given language barrier and no present interpreter.   Objective: Vital signs in last 24 hours: Temp:  [97.5 F (36.4 C)-97.9 F (36.6 C)] 97.5 F (36.4 C) (10/08 0956) Pulse Rate:  [71-90] 75 (10/08 1040) Resp:  [18-28] 20 (10/08 0956) BP: (90-124)/(45-64) 105/64 mmHg (10/08 1040) SpO2:  [96 %-100 %] 100 % (10/08 0956) Weight:  [138 lb 0.1 oz (62.6 kg)-139 lb 5.3 oz (63.2 kg)] 139 lb 5.3 oz (63.2 kg) (10/08 0630) Last BM Date: 11/16/13  Intake/Output from previous day: 10/07 0701 - 10/08 0700 In: 540 [P.O.:540] Out: 315 [Urine:315] Intake/Output this shift: Total I/O In: 360 [P.O.:360] Out: 300 [Urine:300]  Medications Current Facility-Administered Medications  Medication Dose Route Frequency Provider Last Rate Last Dose  . acetaminophen (TYLENOL) tablet 650 mg  650 mg Oral Q4H PRN Leone Brand, NP      . ALPRAZolam Prudy Feeler) tablet 0.25 mg  0.25 mg Oral BID PRN Leone Brand, NP      . aspirin EC tablet 81 mg  81 mg Oral Daily Leone Brand, NP   81 mg at 11/17/13 1040  . atorvastatin (LIPITOR) tablet 40 mg  40 mg Oral q1800 Leone Brand, NP   40 mg at 11/16/13 1532  . carvedilol (COREG) tablet 3.125 mg  3.125 mg Oral BID WC Leone Brand, NP   3.125 mg at 11/17/13 1041  . diphenhydrAMINE (BENADRYL) capsule 25 mg  25 mg Oral Q6H PRN Zannie Cove, MD   25 mg at 11/16/13 1909  .  ipratropium-albuterol (DUONEB) 0.5-2.5 (3) MG/3ML nebulizer solution 3 mL  3 mL Nebulization BID Leone Brand, NP   3 mL at 11/17/13 0936  . isosorbide mononitrate (IMDUR) 24 hr tablet 15 mg  15 mg Oral Daily Leone Brand, NP   15 mg at 11/17/13 1041  . nitroGLYCERIN (NITROSTAT) SL tablet 0.4 mg  0.4 mg Sublingual Q5 Min x 3 PRN Leone Brand, NP      . ondansetron [facility]) injection 4 mg  4 mg Intravenous Q6H PRN Leone Brand, NP      . pantoprazole (PROTONIX) EC tablet 40 mg  40 mg Oral Daily Leone Brand, NP   40 mg at 11/17/13 1041  . Rivaroxaban (XARELTO) tablet 15 mg  15 mg Oral Q breakfast Leone Brand, NP   15 mg at 11/17/13 1040  . tamsulosin (FLOMAX) capsule 0.4 mg  0.4 mg Oral Daily Leone Brand, NP   0.4 mg at 11/17/13 1040    PE: General appearance: alert, cooperative and no distress Lungs: clear to auscultation bilaterally Heart: regular rate and rhythm Extremities: trace LEE on the right Pulses: 2+ and symmetric Skin: warm and dry Neurologic: Grossly normal  Lab Results:   Recent Labs  11/16/13 0708 11/17/13 0007  WBC 10.7* 8.7  HGB 11.6* 10.3*  HCT 33.9* 29.7*  PLT 243 248   BMET  Recent  Labs  11/16/13 0708 11/17/13 0007  NA 136* 135*  K 3.5* 3.9  CL 102 99  CO2 21 20  GLUCOSE 115* 201*  BUN 19 24*  CREATININE 1.83* 1.87*  CALCIUM 8.5 8.4   PT/INR No results found for this basename: LABPROT, INR,  in the last 72 hours Cholesterol  Recent Labs  11/17/13 0007  CHOL 71   Cardiac Panel (last 3 results)  Recent Labs  11/16/13 1201 11/16/13 1801 11/17/13 0007  TROPONINI <0.30 <0.30 <0.30    Assessment/Plan  Active Problems:   Dyspnea   Chronic renal insufficiency, stage III (moderate)   PVD (peripheral vascular disease) 3.5cm AAA Aug 2014   Coronary artery disease   Chronic systolic heart failure, echocardiogram March 2015: ejection fraction 35%,    Paroxysmal atrial fibrillation   Bilateral carotid artery disease    Diarrhea   Vomiting   Urticaria   Ischemic cardiomyopathy   Headache   Chronic anticoagulation   Neck pain on left side   Acute chest pain   Chest pain at rest  1. Chest Pain: ? If symptoms are ischemic. I could not obtain any subjective info from the patient due to language barrier and no present interpreter. However based on observation he appears to be resting comfortably in the supine position on 1 pillow and in no acute distress. No supplemental O2 requirements. Does not appear to be in any pain currently. Cardiac enzymes are negative x 3. NSR on telemetry w/o arrhthymias. His last cath 04/2013 did show left main stenosis with an occluded dominant RCA. In the report the suggestion is 60-70% left main. In other parts of his record there is mention of 80% left main. Not a candidate for CABG given age and co morbidities. Medical therapy was elected at that time. Dr. Excell Seltzerooper to review prior cath report to give input concerning whether high risk PCI with hemodynamic support could be considered in this patient if appropriate. For now, continue medical therapy: ASA, statin, BB and nitrate. BP and HR well controlled.   2. PAF: NSR on telemetry. Rate in the 70s. Continue BB for rate control and Xarelto for stroke prophylaxis.   3. Systolic dysfunction: EF 35%. Appears well compensated by exam. Does not appear to have any resting dyspnea. On room air and resting in supine position with one pillow. Continue BB. No ACE given renal insuffiencey.      LOS: 1 day    Brittainy M. Delmer IslamSimmons, PA-C 11/17/2013 12:13 PM   Patient seen, examined. Available data reviewed. Agree with findings, assessment, and plan as outlined by Robbie LisBrittainy Simmons, PA-C. Daughter present at bedside. Primary complaint is neck/throat pain. Symptoms seem related to swallowing. Clearly not cardiac pain. Will check carotid duplex to rule out any problem related to carotid stent but doubt. [facility]. D/W Dr  Gwenlyn PerkingMadera.  Tonny BollmanMichael Dorie Ohms, M.D. 11/17/2013 4:07 PM

## 2013-11-17 NOTE — Progress Notes (Signed)
[facility] PROGRESS NOTE  [name] ZOX:[id number] DOB: [birth year] DOA: 11/16/2013 PCP: [name], MD  Assessment/Plan: 1. Chest/Epigastric pain-known severe CAD  -cardiac enzymes negative -continue ASA  -Cards following to decide regarding occluded RCA and ?80-% Left main dz  -currently no CP or SOB  2. Vomiting and Diarrhea  -improved/resolved, likely viral gastroenteritis  -will advance diet -no further diarrhea to check for C. diff  -supportive care   3. Neck pain on left side  -L carotid stent per Dr.Berry 3/15  -will need follow up with him for further assessment  4. Ischemic cardiomyopathy/Chronic systolic heart failure, echocardiogram March 2015: ejection fraction 35%,  -currently compensated and euvolemic  -lasix on hold, continue coreg  -if diet tolerated will resume lasix in am  5. Paroxysmal atrial fibrillation  -rate controlled, continue Xarelto and coreg   6. CKD 3  -stable   7. Urticaria  -unclear etiology, No new meds in last few weeks  -likely related to GI infection -resolved -will monitor and if needed will treat with prednisone and benadryl   Code Status: full Family Communication: daughter and wife at bedside Disposition Plan: to be determine    Consultants:  Cardiology   Procedures:  See below for x-ray reports  Antibiotics:  None   HPI/Subjective: Feeling better and currently no complaining of nausea, vomiting, diarrhea, CP or SOB. Interview performed with use of interpreter.  Objective: Filed Vitals:   11/17/13 1040  BP: 105/64  Pulse: 75  Temp:   Resp:     Intake/Output Summary (Last 24 hours) at 11/17/13 1347 Last data filed at 11/17/13 1047  Gross per 24 hour  Intake    900 ml  Output    615 ml  Net    285 ml   Filed Weights   11/16/13 0705 11/16/13 1300 11/17/13 0630  Weight: 65.772 kg (145 lb) 62.6 kg (138 lb 0.1 oz) 63.2 kg (139 lb 5.3 oz)    Exam:   General:  In no distress,  afebrile. Denies SOB and CP  Cardiovascular: S1 and S2, no rubs or gallops, no JVD  Respiratory: CTA bilaterally  Abdomen: soft, NT, ND, positive BS  Musculoskeletal: no edema, no cyanosis or clubbing   Data Reviewed: Basic Metabolic Panel:  Recent Labs Lab 11/16/13 0708 11/16/13 1201 11/17/13 0007  NA 136*  --  135*  K 3.5*  --  3.9  CL 102  --  99  CO2 21  --  20  GLUCOSE 115*  --  201*  BUN 19  --  24*  CREATININE 1.83*  --  1.87*  CALCIUM 8.5  --  8.4  MG  --  1.7  --    Liver Function Tests:  Recent Labs Lab 11/16/13 0708  AST 15  ALT 6  ALKPHOS 77  BILITOT 0.8  PROT 7.3  ALBUMIN 3.6    Recent Labs Lab 11/16/13 0800  LIPASE 31   CBC:  Recent Labs Lab 11/16/13 0708 11/17/13 0007  WBC 10.7* 8.7  NEUTROABS 8.8*  --   HGB 11.6* 10.3*  HCT 33.9* 29.7*  MCV 88.7 87.4  PLT 243 248   Cardiac Enzymes:  Recent Labs Lab 11/16/13 0800 11/16/13 1201 11/16/13 1801 11/17/13 0007  TROPONINI <0.30 <0.30 <0.30 <0.30   BNP (last 3 results)  Recent Labs  04/28/13 1050 09/28/13 0155 11/16/13 0800  PROBNP 2543.0* 475.8* 360.9    Recent Results (from the past 240 hour(s))  URINE CULTURE     Status:  None   Collection Time    11/16/13 10:24 AM      Result Value Ref Range Status   Specimen Description URINE, RANDOM   Final   Special Requests NONE   Final   Culture  Setup Time     Final   Value: 11/16/2013 11:10     Performed at [facility] Count     Final   Value: NO GROWTH     Performed at [facility]   Culture     Final   Value: NO GROWTH     Performed at [facility]   Report Status 11/17/2013 FINAL   Final     Studies: Dg Chest 2 View  11/16/2013   CLINICAL DATA:  Initial encounter for urticaria, emesis and fatigue.  EXAM: CHEST  2 VIEW  COMPARISON:  05/04/2013  FINDINGS: The lung volumes are low. Normal heart size. Calcified atherosclerotic plaque involves the aortic arch. No pleural effusions or  edema. No airspace consolidation identified. Multi level degenerative disc disease noted within the thoracic spine.  IMPRESSION: 1. No acute cardiopulmonary abnormalities noted. 2. Low lung volumes 3. Atherosclerotic disease   Electronically Signed   By: Signa Kell M.D.   On: 11/16/2013 08:24   Ct Head Wo Contrast  11/16/2013   CLINICAL DATA:  Nausea, vomiting, and itchy rash and hives this week ; no history of fever chills; history of hypertension, peripheral vascular disease, and coronary artery disease.  EXAM: CT HEAD WITHOUT CONTRAST  TECHNIQUE: Contiguous axial images were obtained from the base of the skull through the vertex without intravenous contrast.  COMPARISON:  Noncontrast CT scan of the brain of May 01, 2013, and an MRI of the brain of May 03, 2013  FINDINGS: There is mild diffuse cerebral atrophy with compensatory ventriculomegaly. There is no intracranial hemorrhage nor intracranial mass effect. There is stable encephalomalacia in the high posterior left frontal lobe and in the anterior para median portion of the left frontal lobe. There is no acute ischemic change. The cerebellum and brainstem are unremarkable.  The observed paranasal sinuses and mastoid air cells exhibit no air-fluid levels. There is opacification of a few left mastoid air cells. There is no acute skull fracture. There is decreased density in the deep white matter of both cerebral hemispheres consistent with chronic small vessel ischemia.  IMPRESSION: 1. There is no acute intracranial hemorrhage nor evidence of acute ischemic change. There areas of left frontal encephalomalacia and there are stable changes of chronic small vessel ischemia. 2. There is no hydrocephalus nor intracranial mass effect.   Electronically Signed   By: David  [place]   On: 11/16/2013 08:17    Scheduled Meds: . aspirin EC  81 mg Oral Daily  . atorvastatin  40 mg Oral q1800  . carvedilol  3.125 mg Oral BID WC  . ipratropium-albuterol  3 mL  Nebulization BID  . isosorbide mononitrate  15 mg Oral Daily  . pantoprazole  40 mg Oral Daily  . Rivaroxaban  15 mg Oral Q breakfast  . tamsulosin  0.4 mg Oral Daily    Active Problems:   Dyspnea   Chronic renal insufficiency, stage III (moderate)   PVD (peripheral vascular disease) 3.5cm AAA Aug 2014   Coronary artery disease   Chronic systolic heart failure, echocardiogram March 2015: ejection fraction 35%,    Paroxysmal atrial fibrillation   Bilateral carotid artery disease   Diarrhea   Vomiting   Urticaria  Ischemic cardiomyopathy   Headache   Chronic anticoagulation   Neck pain on left side   Acute chest pain   Chest pain at rest    Time spent: < 30 minutes    Vassie LollMadera, Jules Baty  [facility] Pager [phone]. If 7PM-7AM, please contact night-coverage at [email or web address], password [email or web address] 11/17/2013, 1:47 PM  LOS: 1 day

## 2013-11-17 NOTE — Care Management Note (Signed)
    Page 1 of 1   11/17/2013     2:54:00 PM CARE MANAGEMENT NOTE 11/17/2013  Patient:  [name], [name]   Account Number:  [id number]  Date Initiated:  11/17/2013  Documentation initiated by:  [facility]  Subjective/Objective Assessment:   80 year old male with hx of high BP, renal disease, vascular disease, AAA, AFib, and STEMI, CVH, CVA, on Xarelto, carotid endarterectomy presents with CP.//Home with family.     Action/Plan:   R/O C-diff.//Access for disposition needs.   Anticipated DC Date:  11/18/2013   Anticipated DC Plan:           Choice offered to / List presented to:             Status of service:  In process, will continue to follow Medicare Important Message given?   (If response is "NO", the following Medicare IM given date fields will be blank) Date Medicare IM given:   Medicare IM given by:   Date Additional Medicare IM given:   Additional Medicare IM given by:    Discharge Disposition:    Per UR Regulation:  Reviewed for med. necessity/level of care/duration of stay  If discussed at Long Length of Stay Meetings, dates discussed:    Comments:

## 2013-11-17 NOTE — Progress Notes (Signed)
Patient's daughter stated that the patient is complaining of difficulty swallowing and itching with red bumps that appears and disappears. MD made aware and new orders given. Will continue to monitor patient for further changes in condition.

## 2013-11-18 DIAGNOSIS — I255 Ischemic cardiomyopathy: Secondary | ICD-10-CM

## 2013-11-18 DIAGNOSIS — R079 Chest pain, unspecified: Secondary | ICD-10-CM

## 2013-11-18 DIAGNOSIS — I6529 Occlusion and stenosis of unspecified carotid artery: Secondary | ICD-10-CM

## 2013-11-18 DIAGNOSIS — K921 Melena: Secondary | ICD-10-CM

## 2013-11-18 LAB — OCCULT BLOOD X 1 CARD TO LAB, STOOL: Fecal Occult Bld: NEGATIVE

## 2013-11-18 LAB — CLOSTRIDIUM DIFFICILE BY PCR: Toxigenic C. Difficile by PCR: NEGATIVE

## 2013-11-18 MED ORDER — SUCRALFATE 1 GM/10ML PO SUSP
1.0000 g | Freq: Three times a day (TID) | ORAL | Status: DC
  Administered 2013-11-18 – 2013-11-19 (×3): 1 g via ORAL
  Filled 2013-11-18 (×7): qty 10

## 2013-11-18 MED ORDER — PANTOPRAZOLE SODIUM 40 MG PO TBEC
40.0000 mg | DELAYED_RELEASE_TABLET | Freq: Two times a day (BID) | ORAL | Status: DC
  Administered 2013-11-18 – 2013-11-19 (×2): 40 mg via ORAL
  Filled 2013-11-18 (×2): qty 1

## 2013-11-18 NOTE — Progress Notes (Signed)
[facility] PROGRESS NOTE  Draegan Carrara RPR:[id number] DOB: 1926/09/25 DOA: 11/16/2013 PCP: [name], MD  Assessment/Plan: 1. Chest/Epigastric pain-known severe CAD  -cardiac enzymes negative -continue ASA  -Cards following to decide regarding occluded RCA and ?80-% Left main dz  -currently no CP or SOB -plan is to continue conservative management for now  2. Vomiting and Diarrhea  -improved/resolved, likely viral gastroenteritis  -no further episodes; started diet this morning; will see if tolerates it  -had BM this morning; no diarrhea. Will check for c. diff  -continue supportive care   3. Neck pain on left side  -L carotid stent per Dr.Berry 3/15  -following cardiology rec's, will check carotid duplex to evaluate on stent  4. Ischemic cardiomyopathy/Chronic systolic heart failure, echocardiogram March 2015: ejection fraction 35%,  -currently compensated and euvolemic  -lasix on hold, continue coreg  -will resume lasix in am  5. Paroxysmal atrial fibrillation  -rate controlled, continue Xarelto (for now) and coreg   6. CKD 3  -essentially stable  -will follow  7. Urticaria  -unclear etiology, No new meds in last few weeks  -likely related to viral GI infection -resolved now -will monitor and if recurred will treat with prednisone and benadryl  8.blackish stool: will check for FOBT -patient with hx of hiatal hernia and on xarelto -with recent increased episodes of vomiting, most likely mallory weiss vs gastritis -will add carafate, continue PPI; if positive FOBT will ask GI to see   Code Status: full Family Communication: daughter and wife at bedside Disposition Plan: to be determine    Consultants:  Cardiology   Procedures:  See below for x-ray reports  Antibiotics:  None   HPI/Subjective: Feeling ok. Blackish stool this morning. No further nausea, vomiting and/or diarrhea. Afebrile. SPL evaluation WNL. Patient reported sensation  of something stock in chest and tightness sensation.  Objective: Filed Vitals:   11/18/13 0920  BP: 102/46  Pulse: 89  Temp:   Resp:     Intake/Output Summary (Last 24 hours) at 11/18/13 1103 Last data filed at 11/18/13 0756  Gross per 24 hour  Intake    145 ml  Output   1245 ml  Net  -1100 ml   Filed Weights   11/16/13 1300 11/17/13 0630 11/18/13 0555  Weight: 62.6 kg (138 lb 0.1 oz) 63.2 kg (139 lb 5.3 oz) 63.1 kg (139 lb 1.8 oz)    Exam:   General:  In no distress, afebrile. Denies SOB and frank CP. Patient reporting some sensation of something stock in his chest and also noticed black stool this morning. No further diarrhea, nausea and vomiting  Cardiovascular: S1 and S2, no rubs or gallops, no JVD  Respiratory: CTA bilaterally  Abdomen: soft, NT, ND, positive BS  Musculoskeletal: no edema, no cyanosis or clubbing   Data Reviewed: Basic Metabolic Panel:  Recent Labs Lab 11/16/13 0708 11/16/13 1201 11/17/13 0007  NA 136*  --  135*  K 3.5*  --  3.9  CL 102  --  99  CO2 21  --  20  GLUCOSE 115*  --  201*  BUN 19  --  24*  CREATININE 1.83*  --  1.87*  CALCIUM 8.5  --  8.4  MG  --  1.7  --    Liver Function Tests:  Recent Labs Lab 11/16/13 0708  AST 15  ALT 6  ALKPHOS 77  BILITOT 0.8  PROT 7.3  ALBUMIN 3.6    Recent Labs Lab 11/16/13 0800  LIPASE 31   CBC:  Recent Labs Lab 11/16/13 0708 11/17/13 0007  WBC 10.7* 8.7  NEUTROABS 8.8*  --   HGB 11.6* 10.3*  HCT 33.9* 29.7*  MCV 88.7 87.4  PLT 243 248   Cardiac Enzymes:  Recent Labs Lab 11/16/13 0800 11/16/13 1201 11/16/13 1801 11/17/13 0007  TROPONINI <0.30 <0.30 <0.30 <0.30   BNP (last 3 results)  Recent Labs  04/28/13 1050 09/28/13 0155 11/16/13 0800  PROBNP 2543.0* 475.8* 360.9    Recent Results (from the past 240 hour(s))  URINE CULTURE     Status: None   Collection Time    11/16/13 10:24 AM      Result Value Ref Range Status   Specimen Description URINE,  RANDOM   Final   Special Requests NONE   Final   Culture  Setup Time     Final   Value: 11/16/2013 11:10     [facility]formed [facility]   [place] Count     Final   Value: NO GROWTH     [facility]formed at [facility]   Culture     Final   Value: NO GROWTH     [facility]formed at [facility]   Report Status 11/17/2013 FINAL   Final     Studies: No results found.  Scheduled Meds: . aspirin EC  81 mg Oral Daily  . atorvastatin  40 mg Oral q1800  . carvedilol  3.125 mg Oral BID WC  . ipratropium-albuterol  3 mL Nebulization BID  . isosorbide mononitrate  15 mg Oral Daily  . pantoprazole  40 mg Oral BID  . Rivaroxaban  15 mg Oral Q breakfast  . sucralfate  1 g Oral TID WC & HS  . tamsulosin  0.4 mg Oral Daily    Active Problems:   Dyspnea   Chronic renal insufficiency, stage III (moderate)   PVD (peripheral vascular disease) 3.5cm AAA Aug 2014   Coronary artery disease   Chronic systolic heart failure, echocardiogram March 2015: ejection fraction 35%,    Paroxysmal atrial fibrillation   Bilateral carotid artery disease   Diarrhea   Vomiting   Urticaria   Ischemic cardiomyopathy   Headache   Chronic anticoagulation   Neck pain on left side   Acute chest pain   Chest pain at rest    Time spent: < 30 minutes    Vassie LollMadera, Alexina Niccoli  [facility] Pager [phone]. If 7PM-7AM, please contact night-coverage at [email or web address], password [email or web address] 11/18/2013, 11:03 AM  LOS: 2 days

## 2013-11-18 NOTE — Evaluation (Signed)
Clinical/Bedside Swallow Evaluation Patient Details  Name: [name] MRN: [id number] Date of Birth: [birth year]  Today's Date: 11/18/2013 Time: 1015-1057 SLP Time Calculation (min): 42 min  Past Medical History:  Past Medical History  Diagnosis Date  . Hypertension   . Gout   . Hard of hearing   . Anemia   . AAA (abdominal aortic aneurysm)   . Depression   . Hyperplasia, prostate   . Asthma   . H. pylori infection   . PUD (peptic ulcer disease)   . Hiatal hernia   . Renal failure, acute 11/08/2012  . Carotid artery disease   . Coronary artery disease   . CHF (congestive heart failure)    Past Surgical History:  Past Surgical History  Procedure Laterality Date  . Esophagogastroduodenoscopy  06/13/2008,12/31/12  . Carotid stent insertion  20141   HPI:  80 year old male admitted 10/7 for multiple complaints: chest pain/epigastric pain, neck pain, nausea, vomiting, diarrhea, headache and Urticaria.  He was reportedly in his baseline state of health until 2-3 days ago, when he started having vomiting, non bilious, non bloodly and several episodes of diarrhea, this appears to be resolving none today.   Severe CAD; s/p stent, CHF, Hiatal hernia, HTN   Assessment / Plan / Recommendation Clinical Impression  Patient c/o of globus sensation, pointing to the hyoid, and states there is a "tightness" that extends down his chest (pointing mid sternal area).  Daughter interpreted for pt.  Dtr. reports pt, has had this complaint off and on for at least one month.  Oral-pharyngeal swallow function appears normal clinically, with timely swallow initiation, good laryngeal elevation palpated, no cough or throat clearing, and clear vocal quality after swallows of each consistency.  Pt does have a h/o hiatal hernia. Daughter questions if there is a "lump" in pt's throat or esophagus.  Daughter also is concerned about pt's black stool and questions if there is blood.  Deferred these questions to MD.   Please consider GI w/u.  No surther ST is indicated at this time.    Aspiration Risk  Mild    Diet Recommendation Regular;Thin liquid   Liquid Administration via: Cup;Straw Medication Administration: Whole meds with liquid Supervision: Patient able to self feed;Intermittent supervision to cue for compensatory strategies Compensations: Slow rate;Small sips/bites Postural Changes and/or Swallow Maneuvers: Seated upright 90 degrees;Upright 30-60 min after meal    Other  Recommendations Recommended Consults: Consider GI evaluation;Consider esophageal assessment Oral Care Recommendations: Oral care BID Other Recommendations: Clarify dietary restrictions   Follow Up Recommendations  None    Frequency and Duration        Pertinent Vitals/Pain C/o of globus sensation in throat         Swallow Study Prior Functional Status       General HPI: 80 year old male admitted 10/7 for multiple complaints: chest pain/epigastric pain, neck pain, nausea, vomiting, diarrhea, headache and Urticaria.  He was reportedly in his baseline state of health until 2-3 days ago, when he started having vomiting, non bilious, non bloodly and several episodes of diarrhea, this appears to be resolving none today.   Severe CAD; s/p stent, CHF, Hiatal hernia, HTN Type of Study: Bedside swallow evaluation Previous Swallow Assessment: none Diet Prior to this Study: Regular;Thin liquids Temperature Spikes Noted: No Respiratory Status: Room air History of Recent Intubation: No Behavior/Cognition: Alert;Cooperative Oral Cavity - Dentition: Edentulous Self-Feeding Abilities: Able to feed self Patient Positioning: Upright in bed Baseline Vocal Quality: Clear Volitional Cough:  Strong Volitional Swallow: Able to elicit    Oral/Motor/Sensory Function Overall Oral Motor/Sensory Function: Appears within functional limits for tasks assessed   Ice Chips Ice chips: Within functional limits Presentation: Spoon   Thin  Liquid Thin Liquid: Within functional limits Presentation: Cup;Straw;Self Fed    Nectar Thick Nectar Thick Liquid: Not tested   Honey Thick Honey Thick Liquid: Not tested   Puree Puree: Within functional limits Presentation: Spoon   Solid   GO    Solid: Within functional limits Presentation: Self Daine GravelFed       Keylah Darwish T 11/18/2013,10:58 AM

## 2013-11-18 NOTE — Progress Notes (Signed)
Report given to receiving RN. Patient in bed resting with family at bedside. No verbal complaints and no signs or symptoms of distress or discomfort. 

## 2013-11-18 NOTE — Progress Notes (Signed)
Patient Profile: 80 y.o. [place] male with h/o CAD with 80% left main stenosis with an occluded dominant RCA on cath 05/04/13 with initial plans for medical therapy, PAF with h/o of CVA with MRI revealing multiple embolic strokes now on Xarelto, severe systolic dysfunction with EF of 35% and h/o left ICA disease, s/p stenting 04/28/13 with f/u dopplers 06/2013 demonstrating patent stent.   Admitted 10/7 for multiple complaints: chest pain/epigastric pain, neck pain, nausea, vomiting, diarrhea, headache and Urticaria.   Cardiac enzymes negative x 3.   Subjective: The patient is still having intermittent chest tightness and globus/choking sensation that he feels are related. Also unrelated left jaw/neck pain. A history was obtained through his wife who speaks [place].   Objective: Vital signs in last 24 hours: Temp:  [97.3 F (36.3 C)-97.7 F (36.5 C)] 97.7 F (36.5 C) (10/09 0555) Pulse Rate:  [75-89] 89 (10/09 0920) Resp:  [18-20] 20 (10/09 0555) BP: (102-134)/(46-66) 102/46 mmHg (10/09 0920) SpO2:  [97 %-100 %] 98 % (10/09 0935) Weight:  [139 lb 1.8 oz (63.1 kg)] 139 lb 1.8 oz (63.1 kg) (10/09 0555) Last BM Date: 11/16/13  Intake/Output from previous day: 10/08 0701 - 10/09 0700 In: 505 [P.O.:505] Out: 1320 [Urine:1320] Intake/Output this shift: Total I/O In: -  Out: 225 [Urine:225]  Medications Current Facility-Administered Medications  Medication Dose Route Frequency Provider Last Rate Last Dose  . acetaminophen (TYLENOL) tablet 650 mg  650 mg Oral Q4H PRN Leone Brand, NP      . ALPRAZolam Prudy Feeler) tablet 0.25 mg  0.25 mg Oral BID PRN Leone Brand, NP      . aspirin EC tablet 81 mg  81 mg Oral Daily Leone Brand, NP   81 mg at 11/18/13 0920  . atorvastatin (LIPITOR) tablet 40 mg  40 mg Oral q1800 Leone Brand, NP   40 mg at 11/17/13 1755  . carvedilol (COREG) tablet 3.125 mg  3.125 mg Oral BID WC Leone Brand, NP   3.125 mg at 11/18/13 4818  . diphenhydrAMINE  (BENADRYL) capsule 25 mg  25 mg Oral Q6H PRN Zannie Cove, MD   25 mg at 11/16/13 1909  . diphenhydrAMINE (BENADRYL) capsule 25 mg  25 mg Oral Q12H PRN Vassie Loll, MD   25 mg at 11/17/13 1454  . ipratropium-albuterol (DUONEB) 0.5-2.5 (3) MG/3ML nebulizer solution 3 mL  3 mL Nebulization BID Leone Brand, NP   3 mL at 11/18/13 0935  . isosorbide mononitrate (IMDUR) 24 hr tablet 15 mg  15 mg Oral Daily Leone Brand, NP   15 mg at 11/18/13 5631  . nitroGLYCERIN (NITROSTAT) SL tablet 0.4 mg  0.4 mg Sublingual Q5 Min x 3 PRN Leone Brand, NP      . ondansetron [facility]) injection 4 mg  4 mg Intravenous Q6H PRN Leone Brand, NP      . pantoprazole (PROTONIX) EC tablet 40 mg  40 mg Oral Daily Leone Brand, NP   40 mg at 11/18/13 0920  . Rivaroxaban (XARELTO) tablet 15 mg  15 mg Oral Q breakfast Leone Brand, NP   15 mg at 11/18/13 4970  . tamsulosin (FLOMAX) capsule 0.4 mg  0.4 mg Oral Daily Leone Brand, NP   0.4 mg at 11/18/13 2637    PE: General appearance: alert, cooperative and no distress Lungs: clear to auscultation bilaterally Heart: regular rate and rhythm Extremities: trace LEE on the right Pulses: 2+ and symmetric Skin: warm and  dry Neurologic: Grossly normal  Lab Results:   Recent Labs  11/16/13 0708 11/17/13 0007  WBC 10.7* 8.7  HGB 11.6* 10.3*  HCT 33.9* 29.7*  PLT 243 248   BMET  Recent Labs  11/16/13 0708 11/17/13 0007  NA 136* 135*  K 3.5* 3.9  CL 102 99  CO2 21 20  GLUCOSE 115* 201*  BUN 19 24*  CREATININE 1.83* 1.87*  CALCIUM 8.5 8.4   PT/INR No results found for this basename: LABPROT, INR,  in the last 72 hours Cholesterol  Recent Labs  11/17/13 0007  CHOL 71   Cardiac Panel (last 3 results)  Recent Labs  11/16/13 1201 11/16/13 1801 11/17/13 0007  TROPONINI <0.30 <0.30 <0.30    Assessment/Plan  Active Problems:   Dyspnea   Chronic renal insufficiency, stage III (moderate)   PVD (peripheral vascular disease) 3.5cm  AAA Aug 2014   Coronary artery disease   Chronic systolic heart failure, echocardiogram March 2015: ejection fraction 35%,    Paroxysmal atrial fibrillation   Bilateral carotid artery disease   Diarrhea   Vomiting   Urticaria   Ischemic cardiomyopathy   Headache   Chronic anticoagulation   Neck pain on left side   Acute chest pain   Chest pain at rest  80 y.o. [place] male with h/o CAD with 80% left main stenosis with an occluded dominant RCA on cath 05/04/13 with initial plans for medical therapy, PAF with h/o of CVA with MRI revealing multiple embolic strokes now on Xarelto, severe systolic dysfunction with EF of 35% and h/o left ICA disease s/p stenting 04/28/13 who was admitted 10/7 for multiple complaints: chest pain/epigastric pain, neck pain, nausea, vomiting, diarrhea, headache and Urticaria.   Chest/Epigastric pain-known severe CAD. His last cath 04/2013 did show left main stenosis with an occluded dominant RCA. In the report the suggestion is 60-70% left main. In other parts of his record there is mention of 80% left main. Not a candidate for CABG given age and co morbidities. Medical therapy was elected at that time.  -cardiac enzymes negative  -continue ASA, statin, BB and nitrate. -chest pain does not sound cardiac and seems to be related to swallowing.  Neck pain on left side  -L carotid stent per Dr.Berry 3/15  -repeat dopplers pending today.  Ischemic cardiomyopathy/Chronic systolic heart failure, echocardiogram March 2015: ejection fraction 35%,  -currently compensated and euvolemic  -lasix on hold, continue coreg  -if diet tolerated will resume lasix in am   Paroxysmal atrial fibrillation - NSR on telemetry. Continue BB for rate control and Xarelto for stroke prophylaxis.   Vomiting and Diarrhea  -improved/resolved, likely viral gastroenteritis  -supportive care   CKD 3  -kidney function a little elevated. His baseline appears to be around 1.5 and it is 1.87 today.  Lasix on hold currently.    LOS: 2 days    Cline CrockKathryn Thompson, PA-C 11/18/2013 10:29 AM  Patient seen, examined. Available data reviewed. Agree with findings, assessment, and plan as outlined by Carlean JewsKatie Thompson, PA-C. Pt appears quite comfortable but is complaining of pain in the upper chest which has been constant. Communication is extremely difficult. Plan for carotid duplex today since he has had pain around the left neck where his stent is located. Otherwise no plans for further cardiac eval as his pain syndrome is not characteristic of angina.   Tonny BollmanMichael Latiffany Harwick, M.D. 11/18/2013 3:36 PM

## 2013-11-18 NOTE — Progress Notes (Signed)
VASCULAR LAB PRELIMINARY  PRELIMINARY  PRELIMINARY  PRELIMINARY  Carotid duplex completed.    Preliminary report:  Bilateral:  1-39% ICA stenosis.  Patent Left ICA stent  Vertebral artery flow is antegrade.    Renesmee Raine, RVS 11/18/2013, 6:54 PM

## 2013-11-19 DIAGNOSIS — E785 Hyperlipidemia, unspecified: Secondary | ICD-10-CM

## 2013-11-19 DIAGNOSIS — K219 Gastro-esophageal reflux disease without esophagitis: Secondary | ICD-10-CM

## 2013-11-19 LAB — CBC
HEMATOCRIT: 31.2 % — AB (ref 39.0–52.0)
Hemoglobin: 10.7 g/dL — ABNORMAL LOW (ref 13.0–17.0)
MCH: 29.6 pg (ref 26.0–34.0)
MCHC: 34.3 g/dL (ref 30.0–36.0)
MCV: 86.2 fL (ref 78.0–100.0)
PLATELETS: 258 10*3/uL (ref 150–400)
RBC: 3.62 MIL/uL — ABNORMAL LOW (ref 4.22–5.81)
RDW: 14 % (ref 11.5–15.5)
WBC: 7.4 10*3/uL (ref 4.0–10.5)

## 2013-11-19 LAB — BASIC METABOLIC PANEL
Anion gap: 16 — ABNORMAL HIGH (ref 5–15)
BUN: 18 mg/dL (ref 6–23)
CHLORIDE: 104 meq/L (ref 96–112)
CO2: 19 mEq/L (ref 19–32)
CREATININE: 1.65 mg/dL — AB (ref 0.50–1.35)
Calcium: 8.8 mg/dL (ref 8.4–10.5)
GFR calc Af Amer: 42 mL/min — ABNORMAL LOW (ref >90)
GFR calc non Af Amer: 36 mL/min — ABNORMAL LOW (ref >90)
Glucose, Bld: 88 mg/dL (ref 70–99)
Potassium: 4 mEq/L (ref 3.7–5.3)
Sodium: 139 mEq/L (ref 137–147)

## 2013-11-19 LAB — T4, FREE: Free T4: 1.27 ng/dL (ref 0.80–1.80)

## 2013-11-19 MED ORDER — PANTOPRAZOLE SODIUM 40 MG PO TBEC
40.0000 mg | DELAYED_RELEASE_TABLET | Freq: Two times a day (BID) | ORAL | Status: DC
Start: 2013-11-19 — End: 2015-07-31

## 2013-11-19 MED ORDER — SUCRALFATE 1 GM/10ML PO SUSP: 1.0000 g | Freq: Three times a day (TID) | ORAL | Status: DC

## 2013-11-19 NOTE — Discharge Summary (Addendum)
Physician Discharge Summary  [name] ZOX:[id number] DOB: 05/04/1926 DOA: 11/16/2013  PCP: Pamelia HoitWILSON,FRED HENRY, MD  Admit date: 11/16/2013 Discharge date: 11/19/2013  Time spent: >30 minutes  Recommendations for Outpatient Follow-up:  Check BMET to follow electrolytes and renal function Reassess complaints of reflux and if have persisted will need GI follow up  Discharge Diagnoses:  Active Problems:   Dyspnea   Chronic renal insufficiency, stage III (moderate)   PVD (peripheral vascular disease) 3.5cm AAA Aug 2014   Coronary artery disease   Chronic systolic heart failure, echocardiogram March 2015: ejection fraction 35%,    Paroxysmal atrial fibrillation   Bilateral carotid artery disease   Diarrhea   Vomiting   Urticaria   Ischemic cardiomyopathy   Headache   Chronic anticoagulation   Neck pain on left side   Acute chest pain   Chest pain at rest   Discharge Condition: stable and improved. Will discharge home. Follow up with PCP in 10 days  Diet recommendation: heart healthy diet   Filed Weights   11/17/13 0630 11/18/13 0555 11/19/13 0530  Weight: 63.2 kg (139 lb 5.3 oz) 63.1 kg (139 lb 1.8 oz) 62.052 kg (136 lb 12.8 oz)    History of present illness:  80 y.o. male with PMH of severe CAD, P.afib, ICM-EF of 35%, CKD 3, h/o carotid artery stent in 3/15 presents to the ER with multiple complaints. He was reportedly in his baseline state of health until 2-3 days ago, when he started having vomiting, non bilious, non bloodly and several episodes of diarrhea, this appears to be resolving none today.  In addition also reports intermittent ongoing chest pain/epigastric pain and dyspnea on exertion.  No fevers or chills. Also noted raised urticarial rash on neck and limbs.    Hospital Course:  1. Chest/Epigastric pain-known severe CAD  -cardiac enzymes negative  -continue ASA, B-blocker and conservative management -no CP or SOB  2. Vomiting and Diarrhea  -resolved,  likely viral gastroenteritis  -no further episodes; tolerating diet and PO meds  -had BM this morning; no diarrhea. C. diff neg -continue supportive care   3. Neck pain on left side  -L carotid stent per Dr.Berry 3/15  -following cardiology rec's, carotid duplex done to evaluate on stent; everything WNL  4. Ischemic cardiomyopathy/Chronic systolic heart failure, echocardiogram March 2015: ejection fraction 35%,  -currently compensated and euvolemic  -will resume lasix at home dose -low sodium diet, daily weight and asix on hold, continue coreg  -will resume lasix in am   5. Paroxysmal atrial fibrillation  -rate controlled -continue Xarelto and coreg   6. CKD 3  -essentially stable  -Cr 1.6 -follow trend in outpatient setting -patient advise to keep himself hydrated  7. Urticaria  -unclear etiology, No new meds in last few weeks  -likely related to viral GI infection  -resolved now  -if recurred will treat with benadryl   8.blackish stool:  -FOBT negative -Hgb stable -patient with hx of hiatal hernia and on xarelto  -will add carafate to his regimen and increase PI to BID   Procedures:  See below for x-ray   Carotid duplex: Bilateral: 1-39% ICA stenosis. Patent Left ICA stent Vertebral artery flow is antegrade.    Consultations:  Cardiology   Discharge Exam: Filed Vitals:   11/19/13 1014  BP: 114/60  Pulse: 86  Temp:   Resp:    General: In no distress, afebrile. Denies SOB and CP. No further diarrhea, nausea and vomiting  Cardiovascular: S1 and S2,  no rubs or gallops, no JVD  Respiratory: CTA bilaterally  Abdomen: soft, NT, ND, positive BS  Musculoskeletal: no edema, no cyanosis or clubbing    Discharge Instructions You were cared for by a hospitalist during your hospital stay. If you have any questions about your discharge medications or the care you received while you were in the hospital after you are discharged, you can call the unit and asked to  speak with the hospitalist on call if the hospitalist that took care of you is not available. Once you are discharged, your primary care physician will handle any further medical issues. Please note that NO REFILLS for any discharge medications will be authorized once you are discharged, as it is imperative that you return to your primary care physician (or establish a relationship with a primary care physician if you do not have one) for your aftercare needs so that they can reassess your need for medications and monitor your lab values.  Discharge Instructions   Diet - low sodium heart healthy    Complete by:  As directed      Discharge instructions    Complete by:  As directed   Take medications as prescribed Follow a low sodium heart healthy diet Avoid spicy food Arrange follow up with PCP in 10 days          Current Discharge Medication List    START taking these medications   Details  sucralfate (CARAFATE) 1 GM/10ML suspension Take 10 mLs (1 g total) by mouth 4 (four) times daily -  with meals and at bedtime. Qty: 420 mL, Refills: 0      CONTINUE these medications which have CHANGED   Details  pantoprazole (PROTONIX) 40 MG tablet Take 1 tablet (40 mg total) by mouth 2 (two) times daily. Qty: 60 tablet, Refills: 5      CONTINUE these medications which have NOT CHANGED   Details  allopurinol (ZYLOPRIM) 300 MG tablet Take 300 mg by mouth daily.    aspirin EC 81 MG tablet Take 1 tablet (81 mg total) by mouth daily. Qty: 90 tablet, Refills: 3   Associated Diagnoses: Chronic atrial fibrillation; Cerebral infarction due to embolism of cerebral artery    atorvastatin (LIPITOR) 40 MG tablet Take 1 tablet (40 mg total) by mouth daily at 6 PM. Qty: 30 tablet, Refills: 5    carvedilol (COREG) 3.125 MG tablet Take 1 tablet (3.125 mg total) by mouth 2 (two) times daily with a meal. Qty: 60 tablet, Refills: 5    furosemide (LASIX) 40 MG tablet Take 1 tablet (40 mg total) by mouth  daily. Qty: 15 tablet, Refills: 0    ipratropium-albuterol (DUONEB) 0.5-2.5 (3) MG/3ML SOLN Take 3 mLs by nebulization 2 (two) times daily.     isosorbide mononitrate (IMDUR) 30 MG 24 hr tablet Take 0.5 tablets (15 mg total) by mouth daily. Qty: 15 tablet, Refills: 3    nitroGLYCERIN (NITROSTAT) 0.4 MG SL tablet Place 1 tablet (0.4 mg total) under the tongue every 5 (five) minutes x 3 doses as needed for chest pain. Qty: 25 tablet, Refills: 5    Rivaroxaban (XARELTO) 15 MG TABS tablet Take 15 mg by mouth daily.    tamsulosin (FLOMAX) 0.4 MG CAPS capsule Take 0.4 mg by mouth daily.       Allergies  Allergen Reactions  . Phenazopyridine Hcl Other (See Comments)    Unknown reaction  . Septra [Sulfamethoxazole-Tmp Ds] Nausea And Vomiting    GI Upset  Follow-up Information   Follow up with [name], MD In 10 days.   Specialty:  Family Medicine   Contact information:   [address] [place] [address] [phone]       The results of significant diagnostics from this hospitalization (including imaging, microbiology, ancillary and laboratory) are listed below for reference.    Significant Diagnostic Studies: Dg Chest 2 View  11/16/2013   CLINICAL DATA:  Initial encounter for urticaria, emesis and fatigue.  EXAM: CHEST  2 VIEW  COMPARISON:  05/04/2013  FINDINGS: The lung volumes are low. Normal heart size. Calcified atherosclerotic plaque involves the aortic arch. No pleural effusions or edema. No airspace consolidation identified. Multi level degenerative disc disease noted within the thoracic spine.  IMPRESSION: 1. No acute cardiopulmonary abnormalities noted. 2. Low lung volumes 3. Atherosclerotic disease   Electronically Signed   By: Signa Kell M.D.   On: 11/16/2013 08:24   Ct Head Wo Contrast  11/16/2013   CLINICAL DATA:  Nausea, vomiting, and itchy rash and hives this week ; no history of fever chills; history of hypertension, peripheral vascular disease,  and coronary artery disease.  EXAM: CT HEAD WITHOUT CONTRAST  TECHNIQUE: Contiguous axial images were obtained from the base of the skull through the vertex without intravenous contrast.  COMPARISON:  Noncontrast CT scan of the brain of May 01, 2013, and an MRI of the brain of May 03, 2013  FINDINGS: There is mild diffuse cerebral atrophy with compensatory ventriculomegaly. There is no intracranial hemorrhage nor intracranial mass effect. There is stable encephalomalacia in the high posterior left frontal lobe and in the anterior para median portion of the left frontal lobe. There is no acute ischemic change. The cerebellum and brainstem are unremarkable.  The observed paranasal sinuses and mastoid air cells exhibit no air-fluid levels. There is opacification of a few left mastoid air cells. There is no acute skull fracture. There is decreased density in the deep white matter of both cerebral hemispheres consistent with chronic small vessel ischemia.  IMPRESSION: 1. There is no acute intracranial hemorrhage nor evidence of acute ischemic change. There areas of left frontal encephalomalacia and there are stable changes of chronic small vessel ischemia. 2. There is no hydrocephalus nor intracranial mass effect.   Electronically Signed   By: David  [place]   On: 11/16/2013 08:17    Microbiology: Recent Results (from the past 240 hour(s))  URINE CULTURE     Status: None   Collection Time    11/16/13 10:24 AM      Result Value Ref Range Status   Specimen Description URINE, RANDOM   Final   Special Requests NONE   Final   Culture  Setup Time     Final   Value: 11/16/2013 11:10     Performed at [facility]   Colony Count     Final   Value: NO GROWTH     Performed at [facility]   Culture     Final   Value: NO GROWTH     Performed at [facility]   Report Status 11/17/2013 FINAL   Final  CLOSTRIDIUM DIFFICILE BY PCR     Status: None   Collection Time    11/18/13  2:26 PM       Result Value Ref Range Status   C difficile by pcr NEGATIVE  NEGATIVE Final     Labs: Basic Metabolic Panel:  Recent Labs Lab 11/16/13 0708 11/16/13 1201  11/17/13 0007 11/19/13 0314  NA 136*  --  135* 139  K 3.5*  --  3.9 4.0  CL 102  --  99 104  CO2 21  --  20 19  GLUCOSE 115*  --  201* 88  BUN 19  --  24* 18  CREATININE 1.83*  --  1.87* 1.65*  CALCIUM 8.5  --  8.4 8.8  MG  --  1.7  --   --    Liver Function Tests:  Recent Labs Lab 11/16/13 0708  AST 15  ALT 6  ALKPHOS 77  BILITOT 0.8  PROT 7.3  ALBUMIN 3.6    Recent Labs Lab 11/16/13 0800  LIPASE 31   CBC:  Recent Labs Lab 11/16/13 0708 11/17/13 0007 11/19/13 0314  WBC 10.7* 8.7 7.4  NEUTROABS 8.8*  --   --   HGB 11.6* 10.3* 10.7*  HCT 33.9* 29.7* 31.2*  MCV 88.7 87.4 86.2  PLT 243 248 258   Cardiac Enzymes:  Recent Labs Lab 11/16/13 0800 11/16/13 1201 11/16/13 1801 11/17/13 0007  TROPONINI <0.30 <0.30 <0.30 <0.30   BNP: BNP (last 3 results)  Recent Labs  04/28/13 1050 09/28/13 0155 11/16/13 0800  PROBNP 2543.0* 475.8* 360.9     Signed:  Vassie Loll  [facility] 11/19/2013, 2:14 PM

## 2013-11-19 NOTE — Progress Notes (Signed)
DC IV, DC Tele, DC Home. Discharge instructions and home medications discussed with daughter and daughter translated. Patient denied any questions or concerns per daughter. Patient leaving unit via wheelchair and appears in no acute distress.

## 2013-12-09 ENCOUNTER — Encounter (HOSPITAL_COMMUNITY): Payer: Self-pay | Admitting: *Deleted

## 2013-12-09 ENCOUNTER — Other Ambulatory Visit (HOSPITAL_COMMUNITY): Payer: Self-pay | Admitting: Cardiovascular Disease

## 2013-12-09 DIAGNOSIS — I6529 Occlusion and stenosis of unspecified carotid artery: Secondary | ICD-10-CM

## 2014-01-02 ENCOUNTER — Ambulatory Visit (HOSPITAL_COMMUNITY)
Admission: RE | Admit: 2014-01-02 | Discharge: 2014-01-02 | Disposition: A | Discharge status: Home / Self Care | Payer: PRIVATE HEALTH INSURANCE | Source: Ambulatory Visit | Attending: Cardiology | Admitting: Cardiology

## 2014-01-02 DIAGNOSIS — I6529 Occlusion and stenosis of unspecified carotid artery: Secondary | ICD-10-CM

## 2014-01-13 ENCOUNTER — Ambulatory Visit: Payer: PRIVATE HEALTH INSURANCE | Admitting: Cardiovascular Disease

## 2014-01-19 ENCOUNTER — Encounter (HOSPITAL_COMMUNITY): Payer: Self-pay | Admitting: Cardiology

## 2014-02-08 ENCOUNTER — Ambulatory Visit (INDEPENDENT_AMBULATORY_CARE_PROVIDER_SITE_OTHER): Payer: PRIVATE HEALTH INSURANCE | Admitting: Cardiovascular Disease

## 2014-02-08 ENCOUNTER — Encounter: Payer: Self-pay | Admitting: Cardiovascular Disease

## 2014-02-08 VITALS — BP 152/96 | HR 77 | Ht 62.0 in | Wt 146.6 lb

## 2014-02-08 DIAGNOSIS — I739 Peripheral vascular disease, unspecified: Secondary | ICD-10-CM

## 2014-02-08 DIAGNOSIS — I2583 Coronary atherosclerosis due to lipid rich plaque: Secondary | ICD-10-CM

## 2014-02-08 DIAGNOSIS — I10 Essential (primary) hypertension: Secondary | ICD-10-CM

## 2014-02-08 DIAGNOSIS — I779 Disorder of arteries and arterioles, unspecified: Secondary | ICD-10-CM

## 2014-02-08 DIAGNOSIS — E785 Hyperlipidemia, unspecified: Secondary | ICD-10-CM

## 2014-02-08 DIAGNOSIS — I2510 Atherosclerotic heart disease of native coronary artery without angina pectoris: Secondary | ICD-10-CM

## 2014-02-08 DIAGNOSIS — I5022 Chronic systolic (congestive) heart failure: Secondary | ICD-10-CM

## 2014-02-08 DIAGNOSIS — I480 Paroxysmal atrial fibrillation: Secondary | ICD-10-CM

## 2014-02-08 NOTE — Assessment & Plan Note (Signed)
EF 35%, currently compensated

## 2014-02-08 NOTE — Assessment & Plan Note (Signed)
History of hypertension with blood pressure measured today at 152/96. He is on carvedilol 3.125 mg by mouth twice a day.. Continue current meds at current dosing

## 2014-02-08 NOTE — Assessment & Plan Note (Addendum)
History of CAD and ischemic coronary myopathy. He has an occluded RCA with left main disease and an EF of 35%. He was turned down for surgical  Revascularization  as well as percutaneous intervention and medical therapy was recommended. He does get occasional dyspnea on exertion but denies chest pain.

## 2014-02-08 NOTE — Assessment & Plan Note (Signed)
History of left internal carotid artery stenting by myself in March of this year with excellent post procedure Dopplers. He remains neurologically asymptomatic.

## 2014-02-08 NOTE — Patient Instructions (Signed)
Your physician wants you to follow-up in 6 months with Dr. Berry. You will receive a reminder letter in the mail 2 months in advance. If you do not receive a letter, please call our office to schedule the follow-up appointment.  

## 2014-02-08 NOTE — Assessment & Plan Note (Signed)
History of PAF on Xarelto oral anticoagulation with strokes in the past currently maintaining sinus rhythm

## 2014-02-08 NOTE — Progress Notes (Signed)
02/08/2014 [name]   01/29/1927  [id number]  Primary Physician Pamelia HoitWILSON,FRED HENRY, MD Primary Cardiologist: Runell GessJonathan J. Kenon Delashmit MD Roseanne RenoFACP,FACC,FAHA, FSCAI   HPI:  [name] is a [age]year old married [place] male who has a history of congestive heart failure secondary to ischemic cardiomyopathy. I last saw him in the office 06/08/13.[name]. He was found to be in atrial fibrillation and apparently converted to sinus rhythm. The echocardiogram revealed severe dysfunction with an ejection fraction in the 35% range. He had stroke like symptoms and was evaluated by the stroke service. An MRI suggested multiple embolic strokes possibly related to the atial fibrillation.carotid Dopplers were performed and suggested high-grade left internal carotid artery stenosis with string sign". The patient underwent cardiac catheterization by Dr. SwazilandJordan the right radial approach that revealed 80% left main stenosis with an occluded dominant RCA.. Consensus was to treat him medically for this. I was asked to see the patient for consideration of left internal carotid artery stenting. Extensive conversation was had with the patient and his family and after careful consideration of the risks and benefits of medical therapy versus percutaneous intervention, it was decided to proceed with carotid artery stenting.which was performed on 04/28/13 successfully.his post procedure Dopplers in our office were excellent as were his most recent Dopplers in October during the hospitalization. He did have paroxysmal atrial fibrillation with strokes and has maintained sinus rhythm on Xarelto  oral anticoagulation.[facility] 3 days with multiple somatic complaints which ultimately improved and he was discharged home.  Current Outpatient Prescriptions  Medication Sig Dispense Refill  . allopurinol (ZYLOPRIM) 300 MG tablet Take 300 mg by mouth daily.    [name]. aspirin EC 81 MG tablet Take 1  tablet (81 mg total) by mouth daily. 90 tablet 3  . atorvastatin (LIPITOR) 40 MG tablet Take 1 tablet (40 mg total) by mouth daily at 6 PM. 30 tablet 5  . carvedilol (COREG) 3.125 MG tablet Take 1 tablet (3.125 mg total) by mouth 2 (two) times daily with a meal. 60 tablet 5  . furosemide (LASIX) 40 MG tablet Take 1 tablet (40 mg total) by mouth daily. 15 tablet 0  . ipratropium-albuterol (DUONEB) 0.5-2.5 (3) MG/3ML SOLN Take 3 mLs by nebulization 2 (two) times daily.     . isosorbide mononitrate (IMDUR) 30 MG 24 hr tablet Take 0.5 tablets (15 mg total) by mouth daily. 15 tablet 3  . nitroGLYCERIN (NITROSTAT) 0.4 MG SL tablet Place 1 tablet (0.4 mg total) under the tongue every 5 (five) minutes x 3 doses as needed for chest pain. 25 tablet 5  . pantoprazole (PROTONIX) 40 MG tablet Take 1 tablet (40 mg total) by mouth 2 (two) times daily. 60 tablet 5  . Rivaroxaban (XARELTO) 15 MG TABS tablet Take 15 mg by mouth daily.    . tamsulosin (FLOMAX) 0.4 MG CAPS capsule Take 0.4 mg by mouth daily.    . [DISCONTINUED] apixaban (ELIQUIS) 2.5 MG TABS tablet Take 1 tablet (2.5 mg total) by mouth 2 (two) times daily. 60 tablet 3   No current facility-administered medications for this visit.    Allergies  Allergen Reactions  . Phenazopyridine Hcl Other (See Comments)    Unknown reaction  . Septra [Sulfamethoxazole-Trimethoprim] Nausea And Vomiting    GI Upset    History   Social History  . Marital Status: Married    Spouse Name: san    Number of Children: 13  . Years of  Education: 4th grade   Occupational History  . retired    Social History Main Topics  . Smoking status: Former Smoker    Quit date: 02/17/2002  . Smokeless tobacco: Never Used     Comment: Quit seven years ago   . Alcohol Use: No  . Drug Use: No  . Sexual Activity: No   Other Topics Concern  . Not on file   Social History Narrative   Patient lives at home with his daughter   Patient is right handed    Patient drinks  tea and coffee     Review of Systems: General: negative for chills, fever, night sweats or weight changes.  Cardiovascular: negative for chest pain, dyspnea on exertion, edema, orthopnea, palpitations, paroxysmal nocturnal dyspnea or shortness of breath Dermatological: negative for rash Respiratory: negative for cough or wheezing Urologic: negative for hematuria Abdominal: negative for nausea, vomiting, diarrhea, bright red blood per rectum, melena, or hematemesis Neurologic: negative for visual changes, syncope, or dizziness All other systems reviewed and are otherwise negative except as noted above.    Blood pressure 152/96, pulse 77, height 5\' 2"  (1.575 m), weight 146 lb 9.6 oz (66.497 kg).  General appearance: alert and no distress Neck: no adenopathy, no carotid bruit, no JVD, supple, symmetrical, trachea midline and thyroid not enlarged, symmetric, no tenderness/mass/nodules Lungs: clear to auscultation bilaterally Heart: regular rate and rhythm, S1, S2 normal, no murmur, click, rub or gallop Extremities: extremities normal, atraumatic, no cyanosis or edema  EKG normal sinus rhythm at 77 without ST or T-wave changes. I personally reviewed this EKG  ASSESSMENT AND PLAN:   PVD (peripheral vascular disease) 3.5cm AAA Aug 2014 History of a small abdominal aortic aneurysm (3.5 cm) left assessed August 2014. Continue conservative therapy  Paroxysmal atrial fibrillation History of PAF on Xarelto oral anticoagulation with strokes in the past currently maintaining sinus rhythm  Coronary artery disease History of CAD and ischemic coronary myopathy. He has an occluded RCA with left main disease and an EF of 35%. He was turned down for surgical  Revascularization  as well as percutaneous intervention and medical therapy was recommended. He does get occasional dyspnea on exertion but denies chest pain.  Chronic systolic heart failure, echocardiogram March 2015: ejection fraction 35%,  EF  35%, currently compensated  Bilateral carotid artery disease History of left internal carotid artery stenting by myself in March of this year with excellent post procedure Dopplers. He remains neurologically asymptomatic.  Hypertension History of hypertension with blood pressure measured today at 152/96. He is on carvedilol 3.125 mg by mouth twice a day.. Continue current meds at current dosing  Hyperlipidemia History of hyperlipidemia on atorvastatin 40 mg a day. His recent lipid profile performed 11/17/13 revealed a total cholesterol 71, LDL 27 and HDL of 29. Continue current meds at current dosing      [name] [facility] [place], [facility] 02/08/2014 10:33 AM

## 2014-02-08 NOTE — Assessment & Plan Note (Signed)
History of a small abdominal aortic aneurysm (3.5 cm) left assessed August 2014. Continue conservative therapy

## 2014-02-08 NOTE — Assessment & Plan Note (Signed)
History of hyperlipidemia on atorvastatin 40 mg a day. His recent lipid profile performed 11/17/13 revealed a total cholesterol 71, LDL 27 and HDL of 29. Continue current meds at current dosing

## 2014-03-15 ENCOUNTER — Other Ambulatory Visit: Payer: Self-pay | Admitting: Physician Assistant

## 2014-03-15 NOTE — Telephone Encounter (Signed)
Rx has been sent to the pharmacy electronically. ° °

## 2014-06-17 ENCOUNTER — Encounter (HOSPITAL_COMMUNITY): Payer: Self-pay | Admitting: Emergency Medicine

## 2014-06-17 ENCOUNTER — Emergency Department (HOSPITAL_COMMUNITY): Payer: Medicare Other

## 2014-06-17 ENCOUNTER — Observation Stay (HOSPITAL_COMMUNITY)
Admission: EM | Admit: 2014-06-17 | Discharge: 2014-06-19 | Disposition: A | Discharge status: Home / Self Care | Payer: Medicare Other | Attending: Internal Medicine | Admitting: Internal Medicine

## 2014-06-17 DIAGNOSIS — J45909 Unspecified asthma, uncomplicated: Secondary | ICD-10-CM | POA: Insufficient documentation

## 2014-06-17 DIAGNOSIS — E860 Dehydration: Secondary | ICD-10-CM | POA: Diagnosis not present

## 2014-06-17 DIAGNOSIS — I5022 Chronic systolic (congestive) heart failure: Secondary | ICD-10-CM | POA: Diagnosis not present

## 2014-06-17 DIAGNOSIS — I129 Hypertensive chronic kidney disease with stage 1 through stage 4 chronic kidney disease, or unspecified chronic kidney disease: Secondary | ICD-10-CM | POA: Diagnosis not present

## 2014-06-17 DIAGNOSIS — E785 Hyperlipidemia, unspecified: Secondary | ICD-10-CM | POA: Diagnosis not present

## 2014-06-17 DIAGNOSIS — I2510 Atherosclerotic heart disease of native coronary artery without angina pectoris: Secondary | ICD-10-CM | POA: Diagnosis present

## 2014-06-17 DIAGNOSIS — N183 Chronic kidney disease, stage 3 unspecified: Secondary | ICD-10-CM | POA: Diagnosis present

## 2014-06-17 DIAGNOSIS — I509 Heart failure, unspecified: Secondary | ICD-10-CM | POA: Diagnosis not present

## 2014-06-17 DIAGNOSIS — Z87891 Personal history of nicotine dependence: Secondary | ICD-10-CM | POA: Insufficient documentation

## 2014-06-17 DIAGNOSIS — R0600 Dyspnea, unspecified: Secondary | ICD-10-CM | POA: Diagnosis present

## 2014-06-17 DIAGNOSIS — Z8711 Personal history of peptic ulcer disease: Secondary | ICD-10-CM | POA: Diagnosis not present

## 2014-06-17 DIAGNOSIS — I480 Paroxysmal atrial fibrillation: Secondary | ICD-10-CM | POA: Diagnosis present

## 2014-06-17 DIAGNOSIS — F329 Major depressive disorder, single episode, unspecified: Secondary | ICD-10-CM | POA: Diagnosis not present

## 2014-06-17 DIAGNOSIS — I714 Abdominal aortic aneurysm, without rupture, unspecified: Secondary | ICD-10-CM | POA: Diagnosis present

## 2014-06-17 DIAGNOSIS — R42 Dizziness and giddiness: Secondary | ICD-10-CM

## 2014-06-17 DIAGNOSIS — R079 Chest pain, unspecified: Secondary | ICD-10-CM | POA: Diagnosis present

## 2014-06-17 DIAGNOSIS — M109 Gout, unspecified: Secondary | ICD-10-CM | POA: Insufficient documentation

## 2014-06-17 DIAGNOSIS — N179 Acute kidney failure, unspecified: Secondary | ICD-10-CM | POA: Diagnosis present

## 2014-06-17 DIAGNOSIS — Z8679 Personal history of other diseases of the circulatory system: Secondary | ICD-10-CM | POA: Insufficient documentation

## 2014-06-17 DIAGNOSIS — Z7982 Long term (current) use of aspirin: Secondary | ICD-10-CM | POA: Insufficient documentation

## 2014-06-17 DIAGNOSIS — R0602 Shortness of breath: Secondary | ICD-10-CM | POA: Diagnosis present

## 2014-06-17 DIAGNOSIS — K219 Gastro-esophageal reflux disease without esophagitis: Secondary | ICD-10-CM | POA: Insufficient documentation

## 2014-06-17 DIAGNOSIS — Z881 Allergy status to other antibiotic agents status: Secondary | ICD-10-CM | POA: Insufficient documentation

## 2014-06-17 DIAGNOSIS — E871 Hypo-osmolality and hyponatremia: Secondary | ICD-10-CM | POA: Insufficient documentation

## 2014-06-17 DIAGNOSIS — R531 Weakness: Secondary | ICD-10-CM

## 2014-06-17 LAB — URINALYSIS, ROUTINE W REFLEX MICROSCOPIC
BILIRUBIN URINE: NEGATIVE
Glucose, UA: NEGATIVE mg/dL
Hgb urine dipstick: NEGATIVE
KETONES UR: NEGATIVE mg/dL
Leukocytes, UA: NEGATIVE
NITRITE: NEGATIVE
PH: 7 (ref 5.0–8.0)
PROTEIN: NEGATIVE mg/dL
SPECIFIC GRAVITY, URINE: 1.008 (ref 1.005–1.030)
Urobilinogen, UA: 0.2 mg/dL (ref 0.0–1.0)

## 2014-06-17 LAB — CBC
HEMATOCRIT: 35.3 % — AB (ref 39.0–52.0)
Hemoglobin: 12 g/dL — ABNORMAL LOW (ref 13.0–17.0)
MCH: 29.7 pg (ref 26.0–34.0)
MCHC: 34 g/dL (ref 30.0–36.0)
MCV: 87.4 fL (ref 78.0–100.0)
Platelets: 221 10*3/uL (ref 150–400)
RBC: 4.04 MIL/uL — ABNORMAL LOW (ref 4.22–5.81)
RDW: 14.3 % (ref 11.5–15.5)
WBC: 7.6 10*3/uL (ref 4.0–10.5)

## 2014-06-17 LAB — BASIC METABOLIC PANEL
Anion gap: 6 (ref 5–15)
BUN: 14 mg/dL (ref 6–20)
CO2: 22 mmol/L (ref 22–32)
CREATININE: 1.63 mg/dL — AB (ref 0.61–1.24)
Calcium: 8.8 mg/dL — ABNORMAL LOW (ref 8.9–10.3)
Chloride: 95 mmol/L — ABNORMAL LOW (ref 101–111)
GFR calc Af Amer: 42 mL/min — ABNORMAL LOW (ref >60)
GFR calc non Af Amer: 37 mL/min — ABNORMAL LOW (ref >60)
GLUCOSE: 103 mg/dL — AB (ref 70–99)
Potassium: 4.1 mmol/L (ref 3.5–5.1)
SODIUM: 123 mmol/L — AB (ref 135–145)

## 2014-06-17 LAB — BRAIN NATRIURETIC PEPTIDE: B Natriuretic Peptide: 65.4 pg/mL (ref 0.0–100.0)

## 2014-06-17 LAB — I-STAT TROPONIN, ED: TROPONIN I, POC: 0 ng/mL (ref 0.00–0.08)

## 2014-06-17 MED ORDER — MECLIZINE HCL 25 MG PO TABS
25.0000 mg | ORAL_TABLET | Freq: Once | ORAL | Status: AC
  Administered 2014-06-17: 25 mg via ORAL
  Filled 2014-06-17: qty 1

## 2014-06-17 NOTE — ED Notes (Addendum)
Pt. reports central chest pain with mild SOB , occasional dry cough , headache , dizziness and emesis onset this week . Denies fever or chills.

## 2014-06-18 ENCOUNTER — Encounter (HOSPITAL_COMMUNITY): Payer: Self-pay | Admitting: General Practice

## 2014-06-18 DIAGNOSIS — N189 Chronic kidney disease, unspecified: Secondary | ICD-10-CM

## 2014-06-18 DIAGNOSIS — R079 Chest pain, unspecified: Secondary | ICD-10-CM

## 2014-06-18 DIAGNOSIS — I714 Abdominal aortic aneurysm, without rupture: Secondary | ICD-10-CM

## 2014-06-18 DIAGNOSIS — I5022 Chronic systolic (congestive) heart failure: Secondary | ICD-10-CM | POA: Diagnosis not present

## 2014-06-18 DIAGNOSIS — R42 Dizziness and giddiness: Secondary | ICD-10-CM

## 2014-06-18 DIAGNOSIS — E871 Hypo-osmolality and hyponatremia: Secondary | ICD-10-CM

## 2014-06-18 DIAGNOSIS — R531 Weakness: Secondary | ICD-10-CM

## 2014-06-18 LAB — BASIC METABOLIC PANEL
ANION GAP: 11 (ref 5–15)
BUN: 15 mg/dL (ref 6–20)
CO2: 21 mmol/L — ABNORMAL LOW (ref 22–32)
CREATININE: 1.51 mg/dL — AB (ref 0.61–1.24)
Calcium: 9 mg/dL (ref 8.9–10.3)
Chloride: 98 mmol/L — ABNORMAL LOW (ref 101–111)
GFR calc non Af Amer: 40 mL/min — ABNORMAL LOW (ref >60)
GFR, EST AFRICAN AMERICAN: 46 mL/min — AB (ref >60)
Glucose, Bld: 124 mg/dL — ABNORMAL HIGH (ref 70–99)
Potassium: 4 mmol/L (ref 3.5–5.1)
Sodium: 130 mmol/L — ABNORMAL LOW (ref 135–145)

## 2014-06-18 LAB — CBC
HCT: 33.7 % — ABNORMAL LOW (ref 39.0–52.0)
HEMOGLOBIN: 12.2 g/dL — AB (ref 13.0–17.0)
MCH: 30.7 pg (ref 26.0–34.0)
MCHC: 36.2 g/dL — ABNORMAL HIGH (ref 30.0–36.0)
MCV: 84.9 fL (ref 78.0–100.0)
PLATELETS: 192 10*3/uL (ref 150–400)
RBC: 3.97 MIL/uL — AB (ref 4.22–5.81)
RDW: 14.1 % (ref 11.5–15.5)
WBC: 7.8 10*3/uL (ref 4.0–10.5)

## 2014-06-18 LAB — TROPONIN I: Troponin I: <0.03 ng/mL (ref <0.031)

## 2014-06-18 MED ORDER — PANTOPRAZOLE SODIUM 40 MG PO TBEC
40.0000 mg | DELAYED_RELEASE_TABLET | Freq: Every day | ORAL | Status: DC
Start: 2014-06-18 — End: 2014-06-19
  Administered 2014-06-18 – 2014-06-19 (×2): 40 mg via ORAL
  Filled 2014-06-18 (×2): qty 1

## 2014-06-18 MED ORDER — ONDANSETRON HCL 4 MG PO TABS: 4.0000 mg | ORAL_TABLET | Freq: Four times a day (QID) | ORAL | Status: DC | PRN

## 2014-06-18 MED ORDER — SODIUM CHLORIDE 0.9 % IV SOLN
INTRAVENOUS | Status: DC
  Administered 2014-06-18: 03:00:00 via INTRAVENOUS

## 2014-06-18 MED ORDER — RIVAROXABAN 15 MG PO TABS
15.0000 mg | ORAL_TABLET | Freq: Every day | ORAL | Status: DC
  Filled 2014-06-18: qty 1

## 2014-06-18 MED ORDER — ATORVASTATIN CALCIUM 40 MG PO TABS
40.0000 mg | ORAL_TABLET | Freq: Every day | ORAL | Status: DC
  Administered 2014-06-18: 40 mg via ORAL
  Filled 2014-06-18 (×2): qty 1

## 2014-06-18 MED ORDER — ACETAMINOPHEN 325 MG PO TABS
650.0000 mg | ORAL_TABLET | ORAL | Status: DC | PRN
  Administered 2014-06-18: 650 mg via ORAL
  Filled 2014-06-18: qty 2

## 2014-06-18 MED ORDER — MECLIZINE HCL 25 MG PO TABS
25.0000 mg | ORAL_TABLET | Freq: Three times a day (TID) | ORAL | Status: DC | PRN
  Administered 2014-06-18: 25 mg via ORAL
  Filled 2014-06-18 (×3): qty 1

## 2014-06-18 MED ORDER — CARVEDILOL 3.125 MG PO TABS
3.1250 mg | ORAL_TABLET | Freq: Two times a day (BID) | ORAL | Status: DC
  Administered 2014-06-18 – 2014-06-19 (×2): 3.125 mg via ORAL
  Filled 2014-06-18 (×4): qty 1

## 2014-06-18 MED ORDER — SODIUM CHLORIDE 0.9 % IV SOLN
INTRAVENOUS | Status: AC
  Administered 2014-06-18: 13:00:00 via INTRAVENOUS

## 2014-06-18 MED ORDER — RIVAROXABAN 15 MG PO TABS
15.0000 mg | ORAL_TABLET | Freq: Every day | ORAL | Status: DC
  Administered 2014-06-18 – 2014-06-19 (×2): 15 mg via ORAL
  Filled 2014-06-18 (×2): qty 1

## 2014-06-18 MED ORDER — SODIUM CHLORIDE 0.9 % IJ SOLN
3.0000 mL | Freq: Two times a day (BID) | INTRAMUSCULAR | Status: DC
  Administered 2014-06-18 – 2014-06-19 (×2): 3 mL via INTRAVENOUS

## 2014-06-18 MED ORDER — IPRATROPIUM-ALBUTEROL 0.5-2.5 (3) MG/3ML IN SOLN
3.0000 mL | Freq: Two times a day (BID) | RESPIRATORY_TRACT | Status: DC
  Administered 2014-06-18 – 2014-06-19 (×3): 3 mL via RESPIRATORY_TRACT
  Filled 2014-06-18 (×3): qty 3

## 2014-06-18 MED ORDER — SIMETHICONE 40 MG/0.6ML PO SUSP
40.0000 mg | Freq: Four times a day (QID) | ORAL | Status: DC | PRN
  Filled 2014-06-18: qty 0.6

## 2014-06-18 MED ORDER — BUDESONIDE 0.25 MG/2ML IN SUSP
0.2500 mg | Freq: Two times a day (BID) | RESPIRATORY_TRACT | Status: DC
  Administered 2014-06-18 – 2014-06-19 (×2): 0.25 mg via RESPIRATORY_TRACT
  Filled 2014-06-18 (×4): qty 2

## 2014-06-18 MED ORDER — MECLIZINE HCL 25 MG PO TABS
25.0000 mg | ORAL_TABLET | Freq: Once | ORAL | Status: AC
  Administered 2014-06-18: 25 mg via ORAL
  Filled 2014-06-18: qty 1

## 2014-06-18 MED ORDER — ONDANSETRON HCL 4 MG/2ML IJ SOLN: 4.0000 mg | Freq: Four times a day (QID) | INTRAMUSCULAR | Status: DC | PRN

## 2014-06-18 MED ORDER — ISOSORBIDE MONONITRATE 15 MG HALF TABLET
15.0000 mg | ORAL_TABLET | Freq: Every day | ORAL | Status: DC
  Administered 2014-06-18 – 2014-06-19 (×2): 15 mg via ORAL
  Filled 2014-06-18 (×2): qty 1

## 2014-06-18 MED ORDER — ASPIRIN EC 81 MG PO TBEC
81.0000 mg | DELAYED_RELEASE_TABLET | Freq: Every day | ORAL | Status: DC
  Administered 2014-06-18 – 2014-06-19 (×2): 81 mg via ORAL
  Filled 2014-06-18 (×2): qty 1

## 2014-06-18 MED ORDER — BUDESONIDE 0.25 MG/2ML IN SUSP
0.2500 mg | Freq: Two times a day (BID) | RESPIRATORY_TRACT | Status: DC
  Filled 2014-06-18 (×2): qty 2

## 2014-06-18 MED ORDER — ALUM & MAG HYDROXIDE-SIMETH 200-200-20 MG/5ML PO SUSP
30.0000 mL | Freq: Four times a day (QID) | ORAL | Status: DC | PRN
  Administered 2014-06-18: 30 mL via ORAL
  Filled 2014-06-18: qty 30

## 2014-06-18 NOTE — Progress Notes (Signed)
UR completed 

## 2014-06-18 NOTE — Progress Notes (Signed)
Patient alert oriented, no c/o of shortness or pain. Family at bedside. Will continue to monitor patient.

## 2014-06-18 NOTE — ED Provider Notes (Signed)
CSN: [id number]     Arrival date & time 06/17/14  2053 History   First MD Initiated Contact with Patient 06/17/14 2120     Chief Complaint  Patient presents with  . Chest Pain     (Consider location/radiation/quality/duration/timing/severity/associated sxs/prior Treatment) HPI.... Level V caveat secondary to patient's inability to give history. History obtained from daughter. Patient has multiple health problems well documented in the past medical history. Patient reports headache, dizziness, chest pain, dyspnea for 5 days. Symptoms are worse when he tries to ambulate. No fever, chills, dysuria.  Past Medical History  Diagnosis Date  . Hypertension   . Gout   . Hard of hearing   . Anemia   . AAA (abdominal aortic aneurysm)   . Depression   . Hyperplasia, prostate   . Asthma   . H. pylori infection   . PUD (peptic ulcer disease)   . Hiatal hernia   . Renal failure, acute 11/08/2012  . Carotid artery disease   . Coronary artery disease   . CHF (congestive heart failure)    Past Surgical History  Procedure Laterality Date  . Esophagogastroduodenoscopy  06/13/2008,12/31/12  . Carotid stent insertion  2015  . Left heart catheterization with coronary angiogram N/A 05/04/2013    Procedure: LEFT HEART CATHETERIZATION WITH CORONARY ANGIOGRAM;  Surgeon: Peter M Swaziland, MD;  Location: [facility] CATH LAB;  Service: Cardiovascular;  Laterality: N/A;  . Carotid stent insertion N/A 05/09/2013    Procedure: CAROTID STENT INSERTION;  Surgeon: Runell Gess, MD;  Location: [facility] CATH LAB;  Service: Cardiovascular;  Laterality: N/A;   Family History  Problem Relation Age of Onset  . Colon cancer Neg Hx    History  Substance Use Topics  . Smoking status: Former Smoker    Quit date: 02/17/2002  . Smokeless tobacco: Never Used     Comment: Quit seven years ago   . Alcohol Use: No    Review of Systems  Unable to perform ROS: Other      Allergies  Phenazopyridine hcl and Septra  Home  Medications   Prior to Admission medications   Medication Sig Start Date End Date Taking? Authorizing Provider  allopurinol (ZYLOPRIM) 300 MG tablet Take 300 mg by mouth daily.   Yes Historical Provider, MD  aspirin EC 81 MG tablet Take 1 tablet (81 mg total) by mouth daily. 09/08/13  Yes Marvel Plan, MD  atorvastatin (LIPITOR) 40 MG tablet Take 1 tablet (40 mg total) by mouth daily at 6 PM. 05/11/13  Yes Brittainy Sherlynn Carbon, PA-C  bismuth subsalicylate (PEPTO BISMOL) 262 MG/15ML suspension Take 30 mLs by mouth every 4 (four) hours as needed for indigestion.   Yes Historical Provider, MD  carvedilol (COREG) 3.125 MG tablet Take 1 tablet (3.125 mg total) by mouth 2 (two) times daily with a meal. Patient taking differently: Take 6.25 mg by mouth 2 (two) times daily with a meal.  05/11/13  Yes Brittainy M Simmons, PA-C  ipratropium-albuterol (DUONEB) 0.5-2.5 (3) MG/3ML SOLN Take 3 mLs by nebulization 2 (two) times daily.  09/30/13  Yes Historical Provider, MD  isosorbide mononitrate (IMDUR) 30 MG 24 hr tablet TAKE ONE-HALF TABLET BY MOUTH DAILY 03/15/14  Yes Runell Gess, MD  pantoprazole (PROTONIX) 40 MG tablet Take 1 tablet (40 mg total) by mouth 2 (two) times daily. 11/19/13  Yes Vassie Loll, MD  Rivaroxaban (XARELTO) 15 MG TABS tablet Take 15 mg by mouth daily.   Yes Historical Provider, MD  tamsulosin (FLOMAX) 0.4  MG CAPS capsule Take 0.4 mg by mouth daily as needed.    Yes Historical Provider, MD  CORTISPORIN-TC 3.04-12-08-0.5 MG/ML otic suspension Place 3 drops into the right ear 4 (four) times daily.  06/15/14   Historical Provider, MD  furosemide (LASIX) 40 MG tablet Take 1 tablet (40 mg total) by mouth daily. Patient not taking: Reported on 06/17/2014 09/28/13   Derwood Kaplan, MD  nitroGLYCERIN (NITROSTAT) 0.4 MG SL tablet Place 1 tablet (0.4 mg total) under the tongue every 5 (five) minutes x 3 doses as needed for chest pain. 05/11/13   Brittainy M Simmons, PA-C   BP 151/62 mmHg  Pulse 78   Temp(Src) 97.9 F (36.6 C) (Oral)  Resp 25  Ht 5\' 1"  (1.549 m)  Wt 147 lb (66.679 kg)  BMI 27.79 kg/m2  SpO2 99% Physical Exam  Constitutional:  Frail appearing, no obvious dyspnea  HENT:  Head: Normocephalic and atraumatic.  Eyes: Conjunctivae are normal. Pupils are equal, round, and reactive to light.  Neck: Normal range of motion. Neck supple.  Cardiovascular: Normal rate and regular rhythm.   Pulmonary/Chest: Effort normal and breath sounds normal.  Abdominal: Soft. Bowel sounds are normal.  Musculoskeletal:  Unable  Neurological: He is alert.  Skin: Skin is warm and dry.  No edema  Psychiatric:  Flat affect  Nursing note and vitals reviewed.   ED Course  Procedures (including critical care time) Labs Review Labs Reviewed  CBC - Abnormal; Notable for the following:    RBC 4.04 (*)    Hemoglobin 12.0 (*)    HCT 35.3 (*)    All other components within normal limits  BASIC METABOLIC PANEL - Abnormal; Notable for the following:    Sodium 123 (*)    Chloride 95 (*)    Glucose, Bld 103 (*)    Creatinine, Ser 1.63 (*)    Calcium 8.8 (*)    GFR calc non Af Amer 37 (*)    GFR calc Af Amer 42 (*)    All other components within normal limits  BRAIN NATRIURETIC PEPTIDE  URINALYSIS, ROUTINE W REFLEX MICROSCOPIC  I-STAT TROPOININ, ED    Imaging Review Ct Head Wo Contrast  06/17/2014   CLINICAL DATA:  Frontal headache and lightheadedness for 5 days  EXAM: CT HEAD WITHOUT CONTRAST  TECHNIQUE: Contiguous axial images were obtained from the base of the skull through the vertex without intravenous contrast.  COMPARISON:  11/16/2013  FINDINGS: There is no intracranial hemorrhage, mass or evidence of acute infarction. There is moderate generalized cerebral atrophy. There is focal encephalomalacia in the left posterior frontal convexity, in the high right frontal lobe, in the anterior left paramedian frontal lobe, in the anterior inferior left frontal lobe and in the occipital lobes.  These likely represent small remote infarctions. There is extensive periventricular hypodensity which likely represents chronic small vessel ischemic disease.  No bony abnormalities are evident. The visible paranasal sinuses are clear.  IMPRESSION: Moderate cerebral atrophy and chronic small vessel ischemic disease. Multifocal encephalomalacia due to remote infarctions. No acute intracranial findings.   Electronically Signed   By: Ellery Plunk M.D.   On: 06/17/2014 23:19   Dg Chest Port 1 View  06/17/2014   CLINICAL DATA:  Dizziness.  EXAM: PORTABLE CHEST - 1 VIEW  COMPARISON:  11/16/2013  FINDINGS: The cardiomediastinal silhouette is within normal limits. Thoracic aortic calcification is noted. The patient has taken a greater inspiration than on the prior study. No airspace consolidation, edema, pleural effusion, or  pneumothorax is identified. No acute osseous abnormality is identified.  IMPRESSION: No active disease.   Electronically Signed   By: Sebastian Ache   On: 06/17/2014 23:08     EKG Interpretation   Date/Time:  Saturday Jun 17 2014 21:00:09 EDT Ventricular Rate:  79 PR Interval:  278 QRS Duration: 112 QT Interval:  410 QTC Calculation: 470 R Axis:   -21 Text Interpretation:  Sinus rhythm with 1st degree A-V block Nonspecific T  wave abnormality Prolonged QT Abnormal ECG Confirmed by Iona Stay  MD, Yeily Link  ([id number]) on 06/17/2014 10:00:45 PM      MDM   Final diagnoses:  Dizziness  Chest pain, unspecified chest pain type  Hyponatremia    Uncertain etiology of patient's symptom complex. CT head and chest x-ray showed no acute findings. Urinalysis negative. Troponin normal. Sodium noted to be 123. Admit.    Donnetta Hutching, MD 06/18/14 [phone]

## 2014-06-18 NOTE — H&P (Signed)
PCP:   [name], MD   Chief Complaint:  Dizziness, headache, chest pain, sob, generalized weakness  HPI: 81 yo male h/o chf ef around 35%, AAA nonruptured, CKD baseline cr around 1.5, normal weight between 145 to 147 lbs comes in with complaints of feeling dizzy when he stands, and gets very weak and feels like he is going to pass out, along with sscp and sob.  This has been going on for several days.  Symptoms occur when he tries to walk.  No fevers.  No wt gain.  No n/v/d.  Not eating very well.  No cough.  Family is in room to assist with language barrier.  Son and wife also say that his symptoms occur when he gets up to walk, except the headache which he has been having for 5 days.  No recent medication changes.  While at rest he does not have dizziness or chest pain.    Review of Systems:  Positive and negative as per HPI otherwise all other systems are negative  Past Medical History: Past Medical History  Diagnosis Date  . Hypertension   . Gout   . Hard of hearing   . Anemia   . AAA (abdominal aortic aneurysm)   . Depression   . Hyperplasia, prostate   . Asthma   . H. pylori infection   . PUD (peptic ulcer disease)   . Hiatal hernia   . Renal failure, acute 11/08/2012  . Carotid artery disease   . Coronary artery disease   . CHF (congestive heart failure)    Past Surgical History  Procedure Laterality Date  . Esophagogastroduodenoscopy  06/13/2008,12/31/12  . Carotid stent insertion  2015  . Left heart catheterization with coronary angiogram N/A 05/04/2013    Procedure: LEFT HEART CATHETERIZATION WITH CORONARY ANGIOGRAM;  Surgeon: Peter M Swaziland, MD;  Location: [facility] CATH LAB;  Service: Cardiovascular;  Laterality: N/A;  . Carotid stent insertion N/A 05/09/2013    Procedure: CAROTID STENT INSERTION;  Surgeon: Runell Gess, MD;  Location[facility] CATH LAB;  Service: Cardiovascular;  Laterality: N/A;    Medications: Prior to Admission medications   Medication Sig  Start Date End Date Taking? Authorizing Provider  allopurinol (ZYLOPRIM) 300 MG tablet Take 300 mg by mouth daily.   Yes Historical Provider, MD  aspirin EC 81 MG tablet Take 1 tablet (81 mg total) by mouth daily. 09/08/13  Yes Marvel Plan, MD  atorvastatin (LIPITOR) 40 MG tablet Take 1 tablet (40 mg total) by mouth daily at 6 PM. 05/11/13  Yes Brittainy Sherlynn Carbon, PA-C  bismuth subsalicylate (PEPTO BISMOL) 262 MG/15ML suspension Take 30 mLs by mouth every 4 (four) hours as needed for indigestion.   Yes Historical Provider, MD  carvedilol (COREG) 3.125 MG tablet Take 1 tablet (3.125 mg total) by mouth 2 (two) times daily with a meal. Patient taking differently: Take 6.25 mg by mouth 2 (two) times daily with a meal.  05/11/13  Yes Brittainy M Simmons, PA-C  ipratropium-albuterol (DUONEB) 0.5-2.5 (3) MG/3ML SOLN Take 3 mLs by nebulization 2 (two) times daily.  09/30/13  Yes Historical Provider, MD  isosorbide mononitrate (IMDUR) 30 MG 24 hr tablet TAKE ONE-HALF TABLET BY MOUTH DAILY 03/15/14  Yes Runell Gess, MD  pantoprazole (PROTONIX) 40 MG tablet Take 1 tablet (40 mg total) by mouth 2 (two) times daily. 11/19/13  Yes Vassie Loll, MD  Rivaroxaban (XARELTO) 15 MG TABS tablet Take 15 mg by mouth daily.   Yes Historical Provider,  MD  tamsulosin (FLOMAX) 0.4 MG CAPS capsule Take 0.4 mg by mouth daily as needed.    Yes Historical Provider, MD  CORTISPORIN-TC 3.04-12-08-0.5 MG/ML otic suspension Place 3 drops into the right ear 4 (four) times daily.  06/15/14   Historical Provider, MD  furosemide (LASIX) 40 MG tablet Take 1 tablet (40 mg total) by mouth daily. Patient not taking: Reported on 06/17/2014 09/28/13   Derwood Kaplan, MD  nitroGLYCERIN (NITROSTAT) 0.4 MG SL tablet Place 1 tablet (0.4 mg total) under the tongue every 5 (five) minutes x 3 doses as needed for chest pain. 05/11/13   Brittainy Sherlynn Carbon, PA-C    Allergies:   Allergies  Allergen Reactions  . Phenazopyridine Hcl Other (See Comments)     Unknown reaction  . Septra [Sulfamethoxazole-Trimethoprim] Nausea And Vomiting    GI Upset    Social History:  reports that he quit smoking about 12 years ago. He has never used smokeless tobacco. He reports that he does not drink alcohol or use illicit drugs.  Family History: Family History  Problem Relation Age of Onset  . Colon cancer Neg Hx     Physical Exam: Filed Vitals:   06/17/14 2217 06/17/14 2239 06/17/14 2240 06/17/14 2300  BP:  154/85  151/62  Pulse:   72 78  Temp: 97.9 F (36.6 C)     TempSrc:      Resp:      Height:      Weight:      SpO2:   100% 99%   General appearance: alert, cooperative and no distress Head: Normocephalic, without obvious abnormality, atraumatic Eyes: negative Nose: Nares normal. Septum midline. Mucosa normal. No drainage or sinus tenderness. Neck: no JVD and supple, symmetrical, trachea midline Lungs: clear to auscultation bilaterally Heart: regular rate and rhythm, S1, S2 normal, no murmur, click, rub or gallop Abdomen: soft, non-tender; bowel sounds normal; no masses,  no organomegaly Extremities: extremities normal, atraumatic, no cyanosis or edema Pulses: 2+ and symmetric Skin: Skin color, texture, turgor normal. No rashes or lesions Neurologic: Grossly normal    Labs on Admission:   Recent Labs  06/17/14 2128  NA 123*  K 4.1  CL 95*  CO2 22  GLUCOSE 103*  BUN 14  CREATININE 1.63*  CALCIUM 8.8*    Recent Labs  06/17/14 2128  WBC 7.6  HGB 12.0*  HCT 35.3*  MCV 87.4  PLT 221    Radiological Exams on Admission: Ct Head Wo Contrast  06/17/2014   CLINICAL DATA:  Frontal headache and lightheadedness for 5 days  EXAM: CT HEAD WITHOUT CONTRAST  TECHNIQUE: Contiguous axial images were obtained from the base of the skull through the vertex without intravenous contrast.  COMPARISON:  11/16/2013  FINDINGS: There is no intracranial hemorrhage, mass or evidence of acute infarction. There is moderate generalized cerebral  atrophy. There is focal encephalomalacia in the left posterior frontal convexity, in the high right frontal lobe, in the anterior left paramedian frontal lobe, in the anterior inferior left frontal lobe and in the occipital lobes. These likely represent small remote infarctions. There is extensive periventricular hypodensity which likely represents chronic small vessel ischemic disease.  No bony abnormalities are evident. The visible paranasal sinuses are clear.  IMPRESSION: Moderate cerebral atrophy and chronic small vessel ischemic disease. Multifocal encephalomalacia due to remote infarctions. No acute intracranial findings.   Electronically Signed   By: Ellery Plunk M.D.   On: 06/17/2014 23:19   Dg Chest [facility]  06/17/2014   CLINICAL DATA:  Dizziness.  EXAM: PORTABLE CHEST - 1 VIEW  COMPARISON:  11/16/2013  FINDINGS: The cardiomediastinal silhouette is within normal limits. Thoracic aortic calcification is noted. The patient has taken a greater inspiration than on the prior study. No airspace consolidation, edema, pleural effusion, or pneumothorax is identified. No acute osseous abnormality is identified.  IMPRESSION: No active disease.   Electronically Signed   By: Sebastian Ache   On: 06/17/2014 23:08    Assessment/Plan  81 yo male h/o chronic systolic chf, ckd with orthostatic dizziness/generalized weakness/hyponatremia and associated sob and chest pain  Principal Problem:   Chest pain, sob, dizziness, and weakness in setting of orthostasis-  Symptoms seem to be orthostatic related.  Pt just had orthostatic vitals checked which were positive and he got very dizzy and weak upon standing along with sob in the ED no chest pain.  Will romi with serial enzymes.  Hold some of his bp meds.  Gentle ivf overnight.  Active Problems:  Stable unless o/w noted   Dyspnea   Chronic renal insufficiency, stage III (moderate)- at baseline   AAA (abdominal aortic aneurysm) without rupture-  stable    Coronary artery disease   Chronic systolic heart failure, echocardiogram March 2015: ejection fraction 35%,    Paroxysmal atrial fibrillation   Weakness generalized-  No focal neuro deficits.     Hyponatremia-  Ns ivf gentle overnight   Dizziness  obs on tele.  Full code.  Rikia Sukhu A 06/18/2014, 12:19 AM

## 2014-06-18 NOTE — Progress Notes (Signed)
   06/18/14 0159  Vitals  Temp 97.7 F (36.5 C)  Temp Source Oral  BP (!) 151/74 mmHg  BP Location Left Arm  BP Method Automatic  Patient Position (if appropriate) Lying  Pulse Rate 84  Pulse Rate Source Dinamap  Resp 20  Oxygen Therapy  SpO2 98 %  O2 Device Room Air  Height and Weight  Height 5\' 2"  (1.575 m)  Weight 67 kg (147 lb 11.3 oz) (Scale B)  Type of Scale Used Standing  Type of Weight Actual  BSA (Calculated - sq m) 1.71 sq meters  BMI (Calculated) 27.1  Weight in (lb) to have BMI = 25 136.4  Admitted pt to rm 3E22 from ED, oriented to room, call bell placed within reach, pt denied pain, pt does not speak english, family at bedside. Will continue to monitor.

## 2014-06-18 NOTE — Progress Notes (Signed)
Patient seen and examined. Admitted after midnight secondary to dizziness, generalized weakness and chest discomfort. He was found to have positive orthostatic changes and soft BP. He has also findings of hyponatremia/dehydration on exam; positive wheezing and his troponin has been neg X 2. Denies CP. Referred to Dr. Onalee Hua for further info/details on admission.  Plan: -will continue gentle hydration; but will adjust rate to 50cc/hr -BP is better and stable now; will resume home antihypertensive drugs -continue strict I's and O's and daily weight -start pulmicort -follow 2-D echo and clinical response   Vassie Loll [phone]

## 2014-06-19 ENCOUNTER — Ambulatory Visit (HOSPITAL_COMMUNITY): Payer: Medicare Other

## 2014-06-19 DIAGNOSIS — N189 Chronic kidney disease, unspecified: Secondary | ICD-10-CM | POA: Diagnosis not present

## 2014-06-19 DIAGNOSIS — I480 Paroxysmal atrial fibrillation: Secondary | ICD-10-CM

## 2014-06-19 DIAGNOSIS — R42 Dizziness and giddiness: Secondary | ICD-10-CM

## 2014-06-19 DIAGNOSIS — I5022 Chronic systolic (congestive) heart failure: Principal | ICD-10-CM

## 2014-06-19 DIAGNOSIS — R079 Chest pain, unspecified: Secondary | ICD-10-CM | POA: Diagnosis not present

## 2014-06-19 LAB — BASIC METABOLIC PANEL
ANION GAP: 10 (ref 5–15)
BUN: 15 mg/dL (ref 6–20)
CALCIUM: 8.7 mg/dL — AB (ref 8.9–10.3)
CHLORIDE: 98 mmol/L — AB (ref 101–111)
CO2: 23 mmol/L (ref 22–32)
CREATININE: 1.65 mg/dL — AB (ref 0.61–1.24)
GFR calc Af Amer: 42 mL/min — ABNORMAL LOW (ref >60)
GFR calc non Af Amer: 36 mL/min — ABNORMAL LOW (ref >60)
Glucose, Bld: 92 mg/dL (ref 70–99)
Potassium: 3.8 mmol/L (ref 3.5–5.1)
SODIUM: 131 mmol/L — AB (ref 135–145)

## 2014-06-19 MED ORDER — CARVEDILOL 3.125 MG PO TABS: 3.1250 mg | ORAL_TABLET | Freq: Two times a day (BID) | ORAL | Status: DC

## 2014-06-19 MED ORDER — MECLIZINE HCL 25 MG PO TABS: 25.0000 mg | ORAL_TABLET | Freq: Three times a day (TID) | ORAL | Status: DC | PRN

## 2014-06-19 MED ORDER — FUROSEMIDE 20 MG PO TABS: 20.0000 mg | ORAL_TABLET | ORAL | Status: DC

## 2014-06-19 NOTE — Evaluation (Signed)
Physical Therapy Evaluation/ Discharge Patient Details Name: [name] MRN: [id number] DOB: Oct 08, 1926 Today's Date: 06/19/2014   History of Present Illness  81 yo male h/o chf EF around 35%, AAA nonruptured, CKD baseline cr around 1.5, with complaints of feeling dizzy when he stands  Clinical Impression  Pt in room with wife and dgtr with reports of hole in his right ear per dgtr and frequent dizziness with standing and positional changes. Per report no spinning sensation or difficulty with dizziness other than change from supine to sit and sit to stand. Pt with BP 147/81 in sitting and 123/74 in standing. Pt and family educated for orthostatic hypotension, decreased speed of transfers, exercises of bil UE and bil LE prior to transitional movements and to pause or sit with symptoms. Pt educated for walking program to increase endurance as he reports generally feeling weak with increased activity. All questions and education complete with family with voiced understanding and teach back. Pt at baseline without further therapy needs at this time.     Follow Up Recommendations No PT follow up    Equipment Recommendations  None recommended by PT    Recommendations for Other Services       Precautions / Restrictions        Mobility  Bed Mobility Overal bed mobility: Modified Independent                Transfers Overall transfer level: Modified independent                  Ambulation/Gait Ambulation/Gait assistance: Independent Ambulation Distance (Feet): 450 Feet Assistive device: None Gait Pattern/deviations: WFL(Within Functional Limits)   Gait velocity interpretation: at or above normal speed for age/gender    Stairs            Wheelchair Mobility    Modified Rankin (Stroke Patients Only)       Balance                                             Pertinent Vitals/Pain Pain Assessment: No/denies pain    Home Living  Family/patient expects to be discharged to:: Private residence Living Arrangements: Spouse/significant other;Children Available Help at Discharge: Family;Available 24 hours/day Type of Home: House Home Access: Stairs to enter Entrance Stairs-Rails: None Entrance Stairs-Number of Steps: 3 Home Layout: One level Home Equipment: None Additional Comments: works in the garden and cooks at times    Prior Function Level of Independence: Independent               Higher education careers adviser        Extremity/Trunk Assessment   Upper Extremity Assessment: Overall WFL for tasks assessed           Lower Extremity Assessment: Overall WFL for tasks assessed      Cervical / Trunk Assessment: Kyphotic  Communication   Communication: Prefers language other than English;Other (comment) (dgtr interpreted for pt)  Cognition Arousal/Alertness: Awake/alert Behavior During Therapy: WFL for tasks assessed/performed Overall Cognitive Status: Difficult to assess                      General Comments      Exercises        Assessment/Plan    PT Assessment Patent does not need any further PT services  PT Diagnosis Generalized weakness   PT Problem List  PT Treatment Interventions     PT Goals (Current goals can be found in the Care Plan section) Acute Rehab PT Goals PT Goal Formulation: All assessment and education complete, DC therapy    Frequency     Barriers to discharge        Co-evaluation               End of Session   Activity Tolerance: Patient tolerated treatment well Patient left: in bed;with call bell/phone within reach;with family/visitor present Nurse Communication: Mobility status    Functional Assessment Tool Used: clinical judgement Functional Limitation: Changing and maintaining body position Changing and Maintaining Body Position Current Status (K7425): At least 1 percent but less than 20 percent impaired, limited or restricted Changing and  Maintaining Body Position Goal Status (Z5638): At least 1 percent but less than 20 percent impaired, limited or restricted Changing and Maintaining Body Position Discharge Status [phone]): At least 1 percent but less than 20 percent impaired, limited or restricted    Time: 1126-1150 PT Time Calculation (min) (ACUTE ONLY): 24 min   Charges:   PT Evaluation $Initial PT Evaluation Tier I: 1 Procedure     PT G Codes:   PT G-Codes **NOT FOR INPATIENT CLASS** Functional Assessment Tool Used: clinical judgement Functional Limitation: Changing and maintaining body position Changing and Maintaining Body Position Current Status (P2951): At least 1 percent but less than 20 percent impaired, limited or restricted Changing and Maintaining Body Position Goal Status (O8416): At least 1 percent but less than 20 percent impaired, limited or restricted Changing and Maintaining Body Position Discharge Status [phone]): At least 1 percent but less than 20 percent impaired, limited or restricted    Delorse Lek 06/19/2014, 1:54 PM Delaney Meigs, PT [phone]

## 2014-06-19 NOTE — Progress Notes (Signed)
D/c instructions explained and given to pt.s daughter Yolonda Kida who speaks [place], at 73.  Pt family declined interpreter.

## 2014-06-19 NOTE — Progress Notes (Signed)
DC IV, DC Tele, DC Home. Discharge instructions and home medications discussed with patient and patient's family members. Patient's family members denied any questions or concerns at this time. Patient denied translator. Patient leaving unit via wheelchair and appears in no acute distress.

## 2014-06-19 NOTE — Care Management Note (Signed)
Case Management Note  Patient Details  Name: [name] MRN: [id number] Date of Birth: 1926-08-02  Subjective/Objective:     Pt adm on 06/17/14 with CP, weakness, dizziness.  PTA, pt resides at home with family.             Action/Plan: Pt for discharge home today with family.  HHRN for disease management ordered by MD; pt/family agreeable to [facility] follow up.   Referral to [facility], per pt/family choice.  Start of care 24-48h post dc date. Contact for HH follow up is daughter, Gelene Mink -phone [phone].  Daughter states she can translate when nurse visits, as pt and wife speak no [place].    Expected Discharge Date:                  Expected Discharge Plan:  Home w Home Health Services  In-House Referral:     Discharge planning Services  CM Consult  Post Acute Care Choice:    Choice offered to:  Adult Children  DME Arranged:    DME Agency:     HH Arranged:  RN, Disease Management HH Agency:  [facility]  Status of Service:  Completed, signed off  Medicare Important Message Given:  No Date Medicare IM Given:    Medicare IM give by:    Date Additional Medicare IM Given:    Additional Medicare Important Message give by:     If discussed at Long Length of Stay Meetings, dates discussed:    Additional Comments:  Glennon Mac, RN 06/19/2014, 1:36 PM Phone #[phone]

## 2014-06-19 NOTE — Progress Notes (Signed)
\  Echocardiogram 2D Echocardiogram has been performed.  [name] 06/19/2014, 2:48 PM

## 2014-06-19 NOTE — Discharge Summary (Signed)
Physician Discharge Summary  [name] RUE:[id number] DOB: 10-13-1926 DOA: 06/17/2014  PCP: Jonathon Hoit, MD  Admit date: 06/17/2014 Discharge date: 06/19/2014  Time spent: 30 minutes  Recommendations for Outpatient Follow-up:  1. Repeat BMET to follow electrolytes and renal function 2. Please reassess BP and adjust antihypertensive regimen as needed  Discharge Diagnoses:  Principal Problem:   Chest pain at rest Active Problems:   Dyspnea   Chronic renal insufficiency, stage III (moderate)   AAA (abdominal aortic aneurysm) without rupture   Coronary artery disease   Chronic systolic heart failure, echocardiogram March 2015: ejection fraction 35%,    Paroxysmal atrial fibrillation   Weakness generalized   Hyponatremia   Dizziness   Dizzy   Pain in the chest   Discharge Condition: stable and improved. Patient will be discharge home and has been instructed to follow with PCP in 1 week  Diet recommendation: heart healthy/low sodium diet  Filed Weights   06/17/14 2106 06/18/14 0159 06/19/14 0431  Weight: 66.679 kg (147 lb) 67 kg (147 lb 11.3 oz) 66.4 kg (146 lb 6.2 oz)    History of present illness:  81 y/o male with PMH significant for systolic CHF (EF35%), paroxysmal atrial fibrillation, AAA non-ruptured, CKD stage 3 (Cr baseline 1.5), BPH and carotid artery disease; presented to ED complaining of 5 days or so of generalized weakness, dizziness when standing and associated SOB and chest discomfort when walking. Patient w/o fever, chills, nausea, vomiting, abd pain or any other complaints. Per family he has not been eating or drinking properly.   Hospital Course:  1-chronic systolic heart failure: overall compensated; in fact patient was dehydrated on exam  -advise to follow low sodium diet -will continue B-blocker and nitrates -lasix changed to  every other day -patient instructed to check weight on daily basis and to maintain adequate  hydration  2-dizziness/orthostatic changes: due to dehydration and hyponatremia -patient with orthostatic changes and most likely associated to dehydration and continue use of diuretics -symptoms resolved with IVF's resuscitation -instructed to maintain adequate hydration  3-chest discomfort: no nausea, no vomiting and no palpitations -troponin neg X3 -no acute ischemic changes on EKG -will continue ASA, lipitor, coreg and imdur -outpatient follow up with cardiology as needed  4-GERD: continue PPI  5-PAF: rate controlled and stable -will continue xarelto  6-chronic renal failure stage 3: with acute component due to dehydration -essentially resolved and back to baseline per GFR -BMET in 1 week to follow renal function and electrolytes -patient advise to maintain adequate hydration -lasix dose adjusted  7-hx of GOUT: not acute flares continue allopurinol  8-carotid disease: status post stent insertion continue ASA and statins  9-HLD: continue statins  10-essential HTN: stable now -patient advise to take medications as prescribed and to follow heart healthy diet    Procedures:  See below for x-ray reports   Consultations:  None   Discharge Exam: Filed Vitals:   06/19/14 1056  BP: 130/70  Pulse: 82  Temp:   Resp:     General: afebrile, feeling better; no further dizziness and currently w/o CP or SOB Cardiovascular: S1 and S2, no rubs or gallops Respiratory: good air movement, no crackles; mild exp wheezing and rhonchi appreciated Abd: soft, NT, ND, positive BS Extremities: no edema, no cyanosis or clubbing seen on exam   Discharge Instructions   Discharge Instructions    Diet - low sodium heart healthy    Complete by:  As directed      Discharge instructions  Complete by:  As directed   Take medications as prescribed Follow low sodium diet (less than 2 gram) Please check your weight on daily basis Maintain adequate hydration Follow up with PCP in 1  week          Current Discharge Medication List    START taking these medications   Details  meclizine (ANTIVERT) 25 MG tablet Take 1 tablet (25 mg total) by mouth 3 (three) times daily as needed for dizziness. Qty: 30 tablet, Refills: 0      CONTINUE these medications which have CHANGED   Details  carvedilol (COREG) 3.125 MG tablet Take 1 tablet (3.125 mg total) by mouth 2 (two) times daily with a meal. Qty: 60 tablet, Refills: 5    furosemide (LASIX) 20 MG tablet Take 1 tablet (20 mg total) by mouth every other day. Qty: 30 tablet, Refills: 0      CONTINUE these medications which have NOT CHANGED   Details  allopurinol (ZYLOPRIM) 300 MG tablet Take 300 mg by mouth daily.    aspirin EC 81 MG tablet Take 1 tablet (81 mg total) by mouth daily. Qty: 90 tablet, Refills: 3   Associated Diagnoses: Chronic atrial fibrillation; Cerebral infarction due to embolism of cerebral artery    atorvastatin (LIPITOR) 40 MG tablet Take 1 tablet (40 mg total) by mouth daily at 6 PM. Qty: 30 tablet, Refills: 5    bismuth subsalicylate (PEPTO BISMOL) 262 MG/15ML suspension Take 30 mLs by mouth every 4 (four) hours as needed for indigestion.    ipratropium-albuterol (DUONEB) 0.5-2.5 (3) MG/3ML SOLN Take 3 mLs by nebulization 2 (two) times daily.     isosorbide mononitrate (IMDUR) 30 MG 24 hr tablet TAKE ONE-HALF TABLET BY MOUTH DAILY Qty: 15 tablet, Refills: 9    pantoprazole (PROTONIX) 40 MG tablet Take 1 tablet (40 mg total) by mouth 2 (two) times daily. Qty: 60 tablet, Refills: 5    Rivaroxaban (XARELTO) 15 MG TABS tablet Take 15 mg by mouth daily.    tamsulosin (FLOMAX) 0.4 MG CAPS capsule Take 0.4 mg by mouth daily as needed.     CORTISPORIN-TC 3.04-12-08-0.5 MG/ML otic suspension Place 3 drops into the right ear 4 (four) times daily.     nitroGLYCERIN (NITROSTAT) 0.4 MG SL tablet Place 1 tablet (0.4 mg total) under the tongue every 5 (five) minutes x 3 doses as needed for chest  pain. Qty: 25 tablet, Refills: 5       Allergies  Allergen Reactions  . Phenazopyridine Hcl Other (See Comments)    Unknown reaction  . Septra [Sulfamethoxazole-Trimethoprim] Nausea And Vomiting    GI Upset   Follow-up Information    Follow up with Jonathon Hoit, MD. Schedule an appointment as soon as possible for a visit in 1 week.   Specialty:  Family Medicine   Contact information:   [address] [place] [address] [phone]        The results of significant diagnostics from this hospitalization (including imaging, microbiology, ancillary and laboratory) are listed below for reference.    Significant Diagnostic Studies: Ct Head Wo Contrast  06/17/2014   CLINICAL DATA:  Frontal headache and lightheadedness for 5 days  EXAM: CT HEAD WITHOUT CONTRAST  TECHNIQUE: Contiguous axial images were obtained from the base of the skull through the vertex without intravenous contrast.  COMPARISON:  11/16/2013  FINDINGS: There is no intracranial hemorrhage, mass or evidence of acute infarction. There is moderate generalized cerebral atrophy. There is focal  encephalomalacia in the left posterior frontal convexity, in the high right frontal lobe, in the anterior left paramedian frontal lobe, in the anterior inferior left frontal lobe and in the occipital lobes. These likely represent small remote infarctions. There is extensive periventricular hypodensity which likely represents chronic small vessel ischemic disease.  No bony abnormalities are evident. The visible paranasal sinuses are clear.  IMPRESSION: Moderate cerebral atrophy and chronic small vessel ischemic disease. Multifocal encephalomalacia due to remote infarctions. No acute intracranial findings.   Electronically Signed   By: Ellery Plunk M.D.   On: 06/17/2014 23:19   Dg Chest Port 1 View  06/17/2014   CLINICAL DATA:  Dizziness.  EXAM: PORTABLE CHEST - 1 VIEW  COMPARISON:  11/16/2013  FINDINGS: The cardiomediastinal  silhouette is within normal limits. Thoracic aortic calcification is noted. The patient has taken a greater inspiration than on the prior study. No airspace consolidation, edema, pleural effusion, or pneumothorax is identified. No acute osseous abnormality is identified.  IMPRESSION: No active disease.   Electronically Signed   By: Sebastian Ache   On: 06/17/2014 23:08   Labs: Basic Metabolic Panel:  Recent Labs Lab 06/17/14 2128 06/18/14 0840 06/19/14 0357  NA 123* 130* 131*  K 4.1 4.0 3.8  CL 95* 98* 98*  CO2 22 21* 23  GLUCOSE 103* 124* 92  BUN 14 15 15   CREATININE 1.63* 1.51* 1.65*  CALCIUM 8.8* 9.0 8.7*   CBC:  Recent Labs Lab 06/17/14 2128 06/18/14 0840  WBC 7.6 7.8  HGB 12.0* 12.2*  HCT 35.3* 33.7*  MCV 87.4 84.9  PLT 221 192   Cardiac Enzymes:  Recent Labs Lab 06/18/14 0314 06/18/14 0840 06/18/14 1402  TROPONINI <0.03 <0.03 <0.03   BNP: BNP (last 3 results)  Recent Labs  06/17/14 2128  BNP 65.4    ProBNP (last 3 results)  Recent Labs  09/28/13 0155 11/16/13 0800  PROBNP 475.8* 360.9    Signed:  Vassie Loll  [facility] 06/19/2014, 11:53 AM

## 2014-07-07 ENCOUNTER — Emergency Department (HOSPITAL_COMMUNITY)
Admission: EM | Admit: 2014-07-07 | Discharge: 2014-07-07 | Disposition: A | Discharge status: Home / Self Care | Payer: Medicare Other | Attending: Emergency Medicine | Admitting: Emergency Medicine

## 2014-07-07 ENCOUNTER — Encounter (HOSPITAL_COMMUNITY): Payer: Self-pay | Admitting: Emergency Medicine

## 2014-07-07 ENCOUNTER — Emergency Department (HOSPITAL_COMMUNITY): Payer: Medicare Other

## 2014-07-07 DIAGNOSIS — Y999 Unspecified external cause status: Secondary | ICD-10-CM | POA: Insufficient documentation

## 2014-07-07 DIAGNOSIS — R0789 Other chest pain: Secondary | ICD-10-CM

## 2014-07-07 DIAGNOSIS — I2510 Atherosclerotic heart disease of native coronary artery without angina pectoris: Secondary | ICD-10-CM | POA: Diagnosis not present

## 2014-07-07 DIAGNOSIS — M109 Gout, unspecified: Secondary | ICD-10-CM | POA: Diagnosis not present

## 2014-07-07 DIAGNOSIS — N401 Enlarged prostate with lower urinary tract symptoms: Secondary | ICD-10-CM | POA: Diagnosis not present

## 2014-07-07 DIAGNOSIS — Z87448 Personal history of other diseases of urinary system: Secondary | ICD-10-CM | POA: Insufficient documentation

## 2014-07-07 DIAGNOSIS — W010XXA Fall on same level from slipping, tripping and stumbling without subsequent striking against object, initial encounter: Secondary | ICD-10-CM | POA: Insufficient documentation

## 2014-07-07 DIAGNOSIS — N400 Enlarged prostate without lower urinary tract symptoms: Secondary | ICD-10-CM | POA: Diagnosis not present

## 2014-07-07 DIAGNOSIS — Z8659 Personal history of other mental and behavioral disorders: Secondary | ICD-10-CM | POA: Diagnosis not present

## 2014-07-07 DIAGNOSIS — I10 Essential (primary) hypertension: Secondary | ICD-10-CM | POA: Insufficient documentation

## 2014-07-07 DIAGNOSIS — Y939 Activity, unspecified: Secondary | ICD-10-CM | POA: Diagnosis not present

## 2014-07-07 DIAGNOSIS — Z9889 Other specified postprocedural states: Secondary | ICD-10-CM | POA: Insufficient documentation

## 2014-07-07 DIAGNOSIS — Z7982 Long term (current) use of aspirin: Secondary | ICD-10-CM | POA: Diagnosis not present

## 2014-07-07 DIAGNOSIS — Z8669 Personal history of other diseases of the nervous system and sense organs: Secondary | ICD-10-CM | POA: Insufficient documentation

## 2014-07-07 DIAGNOSIS — Y92002 Bathroom of unspecified non-institutional (private) residence single-family (private) house as the place of occurrence of the external cause: Secondary | ICD-10-CM | POA: Diagnosis not present

## 2014-07-07 DIAGNOSIS — J45901 Unspecified asthma with (acute) exacerbation: Secondary | ICD-10-CM | POA: Insufficient documentation

## 2014-07-07 DIAGNOSIS — Z87891 Personal history of nicotine dependence: Secondary | ICD-10-CM | POA: Diagnosis not present

## 2014-07-07 DIAGNOSIS — Z8719 Personal history of other diseases of the digestive system: Secondary | ICD-10-CM | POA: Insufficient documentation

## 2014-07-07 DIAGNOSIS — S299XXA Unspecified injury of thorax, initial encounter: Secondary | ICD-10-CM | POA: Diagnosis not present

## 2014-07-07 DIAGNOSIS — Z8619 Personal history of other infectious and parasitic diseases: Secondary | ICD-10-CM | POA: Diagnosis not present

## 2014-07-07 DIAGNOSIS — Z862 Personal history of diseases of the blood and blood-forming organs and certain disorders involving the immune mechanism: Secondary | ICD-10-CM | POA: Insufficient documentation

## 2014-07-07 DIAGNOSIS — Z7901 Long term (current) use of anticoagulants: Secondary | ICD-10-CM | POA: Insufficient documentation

## 2014-07-07 DIAGNOSIS — Z79899 Other long term (current) drug therapy: Secondary | ICD-10-CM | POA: Insufficient documentation

## 2014-07-07 DIAGNOSIS — I509 Heart failure, unspecified: Secondary | ICD-10-CM | POA: Diagnosis not present

## 2014-07-07 LAB — BASIC METABOLIC PANEL
Anion gap: 11 (ref 5–15)
BUN: 18 mg/dL (ref 6–20)
CALCIUM: 8.6 mg/dL — AB (ref 8.9–10.3)
CO2: 22 mmol/L (ref 22–32)
CREATININE: 1.89 mg/dL — AB (ref 0.61–1.24)
Chloride: 100 mmol/L — ABNORMAL LOW (ref 101–111)
GFR calc Af Amer: 35 mL/min — ABNORMAL LOW (ref >60)
GFR, EST NON AFRICAN AMERICAN: 31 mL/min — AB (ref >60)
Glucose, Bld: 114 mg/dL — ABNORMAL HIGH (ref 65–99)
Potassium: 4.1 mmol/L (ref 3.5–5.1)
Sodium: 133 mmol/L — ABNORMAL LOW (ref 135–145)

## 2014-07-07 LAB — URINALYSIS, ROUTINE W REFLEX MICROSCOPIC
Bilirubin Urine: NEGATIVE
GLUCOSE, UA: NEGATIVE mg/dL
HGB URINE DIPSTICK: NEGATIVE
Ketones, ur: NEGATIVE mg/dL
Leukocytes, UA: NEGATIVE
Nitrite: NEGATIVE
Protein, ur: NEGATIVE mg/dL
Specific Gravity, Urine: 1.01 (ref 1.005–1.030)
Urobilinogen, UA: 0.2 mg/dL (ref 0.0–1.0)
pH: 7 (ref 5.0–8.0)

## 2014-07-07 LAB — CBC WITH DIFFERENTIAL/PLATELET
BASOS ABS: 0 10*3/uL (ref 0.0–0.1)
Basophils Relative: 0 % (ref 0–1)
Eosinophils Absolute: 0.3 10*3/uL (ref 0.0–0.7)
Eosinophils Relative: 4 % (ref 0–5)
HCT: 33.4 % — ABNORMAL LOW (ref 39.0–52.0)
HEMOGLOBIN: 11.4 g/dL — AB (ref 13.0–17.0)
LYMPHS PCT: 19 % (ref 12–46)
Lymphs Abs: 1.5 10*3/uL (ref 0.7–4.0)
MCH: 29.7 pg (ref 26.0–34.0)
MCHC: 34.1 g/dL (ref 30.0–36.0)
MCV: 87 fL (ref 78.0–100.0)
Monocytes Absolute: 0.6 10*3/uL (ref 0.1–1.0)
Monocytes Relative: 8 % (ref 3–12)
Neutro Abs: 5.8 10*3/uL (ref 1.7–7.7)
Neutrophils Relative %: 69 % (ref 43–77)
PLATELETS: 211 10*3/uL (ref 150–400)
RBC: 3.84 MIL/uL — AB (ref 4.22–5.81)
RDW: 14.8 % (ref 11.5–15.5)
WBC: 8.3 10*3/uL (ref 4.0–10.5)

## 2014-07-07 LAB — BRAIN NATRIURETIC PEPTIDE: B Natriuretic Peptide: 42.5 pg/mL (ref 0.0–100.0)

## 2014-07-07 MED ORDER — PREDNISONE 20 MG PO TABS: 60.0000 mg | ORAL_TABLET | Freq: Every day | ORAL | Status: DC

## 2014-07-07 MED ORDER — OXYCODONE HCL 5 MG PO TABS: 5.0000 mg | ORAL_TABLET | Freq: Two times a day (BID) | ORAL | Status: DC | PRN

## 2014-07-07 MED ORDER — IPRATROPIUM BROMIDE 0.02 % IN SOLN
1.0000 mg | Freq: Once | RESPIRATORY_TRACT | Status: AC
  Administered 2014-07-07: 1 mg via RESPIRATORY_TRACT
  Filled 2014-07-07: qty 5

## 2014-07-07 MED ORDER — ALBUTEROL (5 MG/ML) CONTINUOUS INHALATION SOLN
10.0000 mg/h | INHALATION_SOLUTION | RESPIRATORY_TRACT | Status: DC
  Administered 2014-07-07: 10 mg/h via RESPIRATORY_TRACT
  Filled 2014-07-07: qty 20

## 2014-07-07 MED ORDER — SODIUM CHLORIDE 0.9 % IV BOLUS (SEPSIS)
1000.0000 mL | Freq: Once | INTRAVENOUS | Status: AC
  Administered 2014-07-07: 1000 mL via INTRAVENOUS

## 2014-07-07 MED ORDER — MORPHINE SULFATE 4 MG/ML IJ SOLN
4.0000 mg | Freq: Once | INTRAMUSCULAR | Status: AC
  Administered 2014-07-07: 2 mg via INTRAVENOUS
  Filled 2014-07-07: qty 1

## 2014-07-07 NOTE — Discharge Instructions (Signed)
Chest Wall Pain [name], Your xray does not show any rib fractures.  You likely have bruising and a contusion after your fall.  Take oxycodone for severe pain, otherwise use tylenol.  See your primary doctor within 3 days for close follow up. If symptoms worsen, come back to the ED immediately.  Thank you.   ??? Cadieux , Xray ????????????? ?????????? ?? ? ??? ??????? ?????? ??? ???????? ??????? ?????????? ?????? ??? oxycodone ?????? ??????????????????? ???????? ??? Tylenol ??????? ?????????? ????? 3 ??? ????????? ?????? ???????????? ?????????? ????? ?????? ED ?? ????? ????????. N?y Semple, Xray k?hxng khu? m?? s?dng s??khorng h??k d? khu? ?? ca m? rxy c?? la rxy fkc?? h?l?ngc?k k?r l?m s?l?y k?hxng khu? ch?? oxycodone s??h?r?b x?k?r pwd x??ng runrng mi c?han?n ch?? Tylenol phb phthy? h?l?k k?hxng khu? p?h?yn? 3 w?n s??h?r?b k?r tidt?m x??ng k?l? chid h??k m? x?k?r lew lng kl?b p? ED m? th?nth? k?hx k?hxbkhu?.  Chest wall pain is pain felt in or around the chest bones and muscles. It may take up to 6 weeks to get better. It may take longer if you are active. Chest wall pain can happen on its own. Other times, things like germs, injury, coughing, or exercise can cause the pain. HOME CARE   Avoid activities that make you tired or cause pain. Try not to use your chest, belly (abdominal), or side muscles. Do not use heavy weights.  Put ice on the sore area.  Put ice in a plastic bag.  Place a towel between your skin and the bag.  Leave the ice on for 15-20 minutes for the first 2 days.  Only take medicine as told by your doctor. GET HELP RIGHT AWAY IF:   You have more pain or are very uncomfortable.  You have a fever.  Your chest pain gets worse.  You have new problems.  You feel sick to your stomach (nauseous) or throw up (vomit).  You start to sweat or feel lightheaded.  You have a cough with mucus (phlegm).  You cough up blood. MAKE SURE YOU:   Understand  these instructions.  Will watch your condition.  Will get help right away if you are not doing well or get worse. Document Released: 07/16/2007 Document Revised: 04/21/2011 Document Reviewed: 09/23/2010 [facility] Patient Information 2015 [place], [place]. This information is not intended to replace advice given to you by your health care provider. Make sure you discuss any questions you have with your health care provider. Chest Contusion A contusion is a deep bruise. Bruises happen when an injury causes bleeding under the skin. Signs of bruising include pain, puffiness (swelling), and discolored skin. The bruise may turn blue, purple, or yellow.  HOME CARE  Put ice on the injured area.  Put ice in a plastic bag.  Place a towel between the skin and the bag.  Leave the ice on for 15-20 minutes at a time, 03-04 times a day for the first 48 hours.  Only take medicine as told by your doctor.  Rest.  Take deep breaths (deep-breathing exercises) as told by your doctor.  Stop smoking if you smoke.  Do not lift objects over 5 pounds (2.3 kilograms) for 3 days or longer if told by your doctor. GET HELP RIGHT AWAY IF:   You have more bruising or puffiness.  You have pain that gets worse.  You have trouble breathing.  You are dizzy, weak, or pass out (faint).  You have blood in your pee (urine) or poop (  stool).  You cough up or throw up (vomit) blood.  Your puffiness or pain is not helped with medicines. MAKE SURE YOU:   Understand these instructions.  Will watch your condition.  Will get help right away if you are not doing well or get worse. Document Released: 07/16/2007 Document Revised: 10/22/2011 Document Reviewed: 07/21/2011 [facility] Patient Information 2015 [place], [place]. This information is not intended to replace advice given to you by your health care provider. Make sure you discuss any questions you have with your health care provider.

## 2014-07-07 NOTE — ED Notes (Signed)
DC instructions reviewed with pt's family member.

## 2014-07-07 NOTE — ED Notes (Signed)
Pt's family reports the pt fell on Tuesday in the bathroom, pt reports left sided ribcage pain and left sided pain.

## 2014-07-07 NOTE — ED Provider Notes (Signed)
CSN: [id number]     Arrival date & time 07/07/14  0348 History  This chart was scribed for Tomasita Crumble, MD by Freida Busman, ED Scribe. This patient was seen in room D36C/D36C and the patient's care was started 3:57 AM.    Chief Complaint  Patient presents with  . Fall   The history is provided by a relative. No language interpreter was used.   HPI Comments:  [name] is a 81 y.o. male who presents to the Emergency Department complaining of gradual onset left sided rib pain s/p fall 4 days ago. [name] states pt slipped while kneeling injuring his ribs; daughter denies LOC and head injury. He has taken tylenol without relief. Daughter notes pt's pain is worse with deep breath. She also reports pt has a h/o blood clots she is unsure where but notes pt is currently on Xarelto. Pt does not speak english; history provided by pt's daughter.   Past Medical History  Diagnosis Date  . Hypertension   . Gout   . Hard of hearing   . Anemia   . AAA (abdominal aortic aneurysm)   . Depression   . Hyperplasia, prostate   . Asthma   . H. pylori infection   . PUD (peptic ulcer disease)   . Hiatal hernia   . Renal failure, acute 11/08/2012  . Carotid artery disease   . Coronary artery disease   . CHF (congestive heart failure)    Past Surgical History  Procedure Laterality Date  . Esophagogastroduodenoscopy  06/13/2008,12/31/12  . Carotid stent insertion  2015  . Left heart catheterization with coronary angiogram N/A 05/04/2013    Procedure: LEFT HEART CATHETERIZATION WITH CORONARY ANGIOGRAM;  Surgeon: Peter M Swaziland, MD;  Location: [facility] CATH LAB;  Service: Cardiovascular;  Laterality: N/A;  . Carotid stent insertion N/A 05/09/2013    Procedure: CAROTID STENT INSERTION;  Surgeon: Runell Gess, MD;  Location: [facility] CATH LAB;  Service: Cardiovascular;  Laterality: N/A;   Family History  Problem Relation Age of Onset  . Colon cancer Neg Hx    History  Substance Use Topics  .  Smoking status: Former Smoker    Quit date: 02/17/2002  . Smokeless tobacco: Never Used     Comment: Quit seven years ago   . Alcohol Use: No    Review of Systems  Unable to perform ROS due to language barrier     Allergies  Phenazopyridine hcl and Septra  Home Medications   Prior to Admission medications   Medication Sig Start Date End Date Taking? Authorizing Provider  allopurinol (ZYLOPRIM) 300 MG tablet Take 300 mg by mouth daily.    Historical Provider, MD  aspirin EC 81 MG tablet Take 1 tablet (81 mg total) by mouth daily. 09/08/13   Marvel Plan, MD  atorvastatin (LIPITOR) 40 MG tablet Take 1 tablet (40 mg total) by mouth daily at 6 PM. 05/11/13   Brittainy M Sharol Harness, PA-C  bismuth subsalicylate (PEPTO BISMOL) 262 MG/15ML suspension Take 30 mLs by mouth every 4 (four) hours as needed for indigestion.    Historical Provider, MD  carvedilol (COREG) 3.125 MG tablet Take 1 tablet (3.125 mg total) by mouth 2 (two) times daily with a meal. 06/19/14   Vassie Loll, MD  CORTISPORIN-TC 3.04-12-08-0.5 MG/ML otic suspension Place 3 drops into the right ear 4 (four) times daily.  06/15/14   Historical Provider, MD  furosemide (LASIX) 20 MG tablet Take 1 tablet (20 mg total) by mouth every other  day. 06/19/14   Vassie Loll, MD  ipratropium-albuterol (DUONEB) 0.5-2.5 (3) MG/3ML SOLN Take 3 mLs by nebulization 2 (two) times daily.  09/30/13   Historical Provider, MD  isosorbide mononitrate (IMDUR) 30 MG 24 hr tablet TAKE ONE-HALF TABLET BY MOUTH DAILY 03/15/14   Runell Gess, MD  meclizine (ANTIVERT) 25 MG tablet Take 1 tablet (25 mg total) by mouth 3 (three) times daily as needed for dizziness. 06/19/14   Vassie Loll, MD  nitroGLYCERIN (NITROSTAT) 0.4 MG SL tablet Place 1 tablet (0.4 mg total) under the tongue every 5 (five) minutes x 3 doses as needed for chest pain. 05/11/13   Brittainy Sherlynn Carbon, PA-C  pantoprazole (PROTONIX) 40 MG tablet Take 1 tablet (40 mg total) by mouth 2 (two) times daily.  11/19/13   Vassie Loll, MD  Rivaroxaban (XARELTO) 15 MG TABS tablet Take 15 mg by mouth daily.    Historical Provider, MD  tamsulosin (FLOMAX) 0.4 MG CAPS capsule Take 0.4 mg by mouth daily as needed.     Historical Provider, MD   There were no vitals taken for this visit. Physical Exam  Constitutional: He is oriented to person, place, and time. Vital signs are normal. He appears well-developed and well-nourished.  Non-toxic appearance. He does not appear ill. No distress.  HENT:  Head: Normocephalic and atraumatic.  Nose: Nose normal.  Mouth/Throat: Oropharynx is clear and moist. No oropharyngeal exudate.  Eyes: Conjunctivae and EOM are normal. Pupils are equal, round, and reactive to light. No scleral icterus.  Neck: Normal range of motion. Neck supple. No tracheal deviation, no edema, no erythema and normal range of motion present. No thyroid mass and no thyromegaly present.  Cardiovascular: Normal rate, regular rhythm, S1 normal, S2 normal, normal heart sounds, intact distal pulses and normal pulses.  Exam reveals no gallop and no friction rub.   No murmur heard. Pulses:      Radial pulses are 2+ on the right side, and 2+ on the left side.       Dorsalis pedis pulses are 2+ on the right side, and 2+ on the left side.  Pulmonary/Chest: He is in respiratory distress. He has wheezes (Bilaterally). He has no rhonchi. He has no rales.  Tachypnea  Using accessory muscle  Abdominal: Soft. Normal appearance and bowel sounds are normal. He exhibits no distension, no ascites and no mass. There is no hepatosplenomegaly. There is no tenderness. There is no rebound, no guarding and no CVA tenderness.  Musculoskeletal: Normal range of motion. He exhibits tenderness. He exhibits no edema.   TTP OVER LEFT ANTERIOR RIB CAGE   Lymphadenopathy:    He has no cervical adenopathy.  Neurological: He is alert and oriented to person, place, and time. He has normal strength. No cranial nerve deficit or  sensory deficit.  Skin: Skin is warm, dry and intact. No petechiae and no rash noted. He is not diaphoretic. No erythema. No pallor.  Psychiatric: He has a normal mood and affect. His behavior is normal. Judgment normal.  Nursing note and vitals reviewed.   ED Course  Procedures   DIAGNOSTIC STUDIES:  Oxygen Saturation is 98% on RA, normal by my interpretation.    COORDINATION OF CARE:  4:03 AM Will order breathing treatment, morphine and CXR.    Labs Review Labs Reviewed  CBC WITH DIFFERENTIAL/PLATELET - Abnormal; Notable for the following:    RBC 3.84 (*)    Hemoglobin 11.4 (*)    HCT 33.4 (*)    All  other components within normal limits  BASIC METABOLIC PANEL - Abnormal; Notable for the following:    Sodium 133 (*)    Chloride 100 (*)    Glucose, Bld 114 (*)    Creatinine, Ser 1.89 (*)    Calcium 8.6 (*)    GFR calc non Af Amer 31 (*)    GFR calc Af Amer 35 (*)    All other components within normal limits  URINALYSIS, ROUTINE W REFLEX MICROSCOPIC (NOT AT [facility])  BRAIN NATRIURETIC PEPTIDE    Imaging Review Dg Ribs Unilateral W/chest Left  07/07/2014   CLINICAL DATA:  Fall with left anterior rib injury. Initial encounter.  EXAM: LEFT RIBS AND CHEST - 3+ VIEW  COMPARISON:  06/17/2014  FINDINGS: Chronic cardiomegaly. Stable aortic and hilar contours. There is no edema, consolidation, effusion, or pneumothorax. No visible rib fracture.  Left neck vascular stent.  IMPRESSION: No evidence of fracture or intrathoracic injury.   Electronically Signed   By: Marnee Spring M.D.   On: 07/07/2014 05:52     EKG Interpretation   Date/Time:  Friday Jul 07 2014 04:00:57 EDT Ventricular Rate:  77 PR Interval:    QRS Duration: 113 QT Interval:  433 QTC Calculation: 490 R Axis:   25 Text Interpretation:  Normal sinus rhythm Borderline intraventricular  conduction delay wandering baseline Confirmed by Erroll Luna  [phone]) on 07/07/2014 4:16:44 AM      MDM   Final  diagnoses:  None   Patient presents to emergency department for chest pain after a fall and shortness of breath. Patient is in respiratory distress in my evaluation. I ordered a continuous albuterol treatment, ipratropium, prednisone for treatment. Chest x-ray does not show any evidence of rib fracture.  Upon repeat evaluation the patient his wheezing has resolved. He appears to be breathing much more comfortably. He still has mild tenderness to palpation of his anterior ribs. X-ray is reassuring and does not show evidence of fracture. Discharge home with oxycodone to take as needed.   He will be given a short course of oxycodone to take at home as needed for pain. Primary care follow-up was advised within 3 days. We'll also place the patient on prednisone burst for asthma exacerbation. He otherwise appears well in no acute distress. His vital signs were within his normal limits and he is safe for discharge.  CRITICAL CARE Performed by: Tomasita Crumble   Total critical care time:  Critical care time was exclusive of separately billable procedures and treating other patients.  Critical care was necessary to treat or prevent imminent or life-threatening deterioration.  Critical care was time spent personally by me on the following activities: development of treatment plan with patient and/or surrogate as well as nursing, discussions with consultants, evaluation of patient's response to treatment, examination of patient, obtaining history from patient or surrogate, ordering and performing treatments and interventions, ordering and review of laboratory studies, ordering and review of radiographic studies, pulse oximetry and re-evaluation of patient's condition.   I personally performed the services described in this documentation, which was scribed in my presence. The recorded information has been reviewed and is accurate.     Tomasita Crumble, MD 07/07/14 [phone]

## 2014-08-02 ENCOUNTER — Encounter: Payer: Self-pay | Admitting: Cardiovascular Disease

## 2014-08-02 ENCOUNTER — Ambulatory Visit (INDEPENDENT_AMBULATORY_CARE_PROVIDER_SITE_OTHER): Payer: Medicare Other | Admitting: Cardiovascular Disease

## 2014-08-02 VITALS — BP 122/86 | HR 90 | Ht 61.0 in | Wt 151.6 lb

## 2014-08-02 DIAGNOSIS — E785 Hyperlipidemia, unspecified: Secondary | ICD-10-CM

## 2014-08-02 DIAGNOSIS — I779 Disorder of arteries and arterioles, unspecified: Secondary | ICD-10-CM

## 2014-08-02 DIAGNOSIS — I480 Paroxysmal atrial fibrillation: Secondary | ICD-10-CM

## 2014-08-02 DIAGNOSIS — I2510 Atherosclerotic heart disease of native coronary artery without angina pectoris: Secondary | ICD-10-CM

## 2014-08-02 DIAGNOSIS — I739 Peripheral vascular disease, unspecified: Secondary | ICD-10-CM

## 2014-08-02 DIAGNOSIS — I10 Essential (primary) hypertension: Secondary | ICD-10-CM

## 2014-08-02 DIAGNOSIS — I255 Ischemic cardiomyopathy: Secondary | ICD-10-CM | POA: Diagnosis not present

## 2014-08-02 DIAGNOSIS — I2583 Coronary atherosclerosis due to lipid rich plaque: Secondary | ICD-10-CM

## 2014-08-02 NOTE — Assessment & Plan Note (Signed)
History of ischemic cardiopathy with recent echo performed last month revealing EF of 40%. He did have a cardiac cath rotation performed by Dr. Swaziland last year which showed an 80% left main with an occluded dominant RCA. The consensus that time was to treat him medically with his comorbidities and age. He does get occasional chest pain.

## 2014-08-02 NOTE — Progress Notes (Signed)
08/02/2014 [name]   1926-10-23  [id number]  Primary Physician [name], MD Primary Cardiologist: [name] [name]  HPI:  [name] is a 81 year old old married [place] male who has a history of congestive heart failure secondary to ischemic cardiomyopathy. I last saw him in the office 02/08/14... He was found to be in atrial fibrillation and apparently converted to sinus rhythm. The echocardiogram revealed severe dysfunction with an ejection fraction in the 35% range. He had stroke like symptoms and was evaluated by the stroke service. An MRI suggested multiple embolic strokes possibly related to the atial fibrillation.carotid Dopplers were performed and suggested high-grade left internal carotid artery stenosis with string sign". The patient underwent cardiac catheterization by Dr. Swaziland the right radial approach that revealed 80% left main stenosis with an occluded dominant RCA.. Consensus was to treat him medically for this. I was asked to see the patient for consideration of left internal carotid artery stenting. Extensive conversation was had with the patient and his family and after careful consideration of the risks and benefits of medical therapy versus percutaneous intervention, it was decided to proceed with carotid artery stenting.which was performed on 04/28/13 successfully.his post procedure Dopplers in our office were excellent as were his most recent Dopplers in October during the hospitalization. He did have paroxysmal atrial fibrillation with strokes and has maintained sinus rhythm on Xarelto oral anticoagulation. Since I saw him 6 months ago he's remained relatively stable. He was admitted last month with chest pain and ruled out for myocardial infarction. A 2-D echo revealed an ejection fraction of 40%. He does admit to dietary indiscretion with regard to salt. He gets occasional chest pain.   Current Outpatient Prescriptions    Medication Sig Dispense Refill  . allopurinol (ZYLOPRIM) 300 MG tablet Take 300 mg by mouth daily.    [name] aspirin EC 81 MG tablet Take 1 tablet (81 mg total) by mouth daily. 90 tablet 3  . atorvastatin (LIPITOR) 40 MG tablet Take 1 tablet (40 mg total) by mouth daily at 6 PM. 30 tablet 5  . carvedilol (COREG) 3.125 MG tablet Take 1 tablet (3.125 mg total) by mouth 2 (two) times daily with a meal. 60 tablet 5  . CORTISPORIN-TC 3.04-12-08-0.5 MG/ML otic suspension Place 3 drops into the right ear 4 (four) times daily.     . diazepam (VALIUM) 2 MG tablet Take 2 mg by mouth 2 (two) times daily.    . furosemide (LASIX) 20 MG tablet Take 1 tablet (20 mg total) by mouth every other day. 30 tablet 0  . ipratropium-albuterol (DUONEB) 0.5-2.5 (3) MG/3ML SOLN Take 3 mLs by nebulization 2 (two) times daily.     . isosorbide mononitrate (IMDUR) 30 MG 24 hr tablet TAKE ONE-HALF TABLET BY MOUTH DAILY 15 tablet 9  . nitroGLYCERIN (NITROSTAT) 0.4 MG SL tablet Place 1 tablet (0.4 mg total) under the tongue every 5 (five) minutes x 3 doses as needed for chest pain. 25 tablet 5  . pantoprazole (PROTONIX) 40 MG tablet Take 1 tablet (40 mg total) by mouth 2 (two) times daily. 60 tablet 5  . Rivaroxaban (XARELTO) 15 MG TABS tablet Take 15 mg by mouth daily.    . tamsulosin (FLOMAX) 0.4 MG CAPS capsule Take 0.4 mg by mouth daily.     . meclizine (ANTIVERT) 12.5 MG tablet Take 1 tablet by mouth daily as needed.    . [DISCONTINUED] apixaban (ELIQUIS) 2.5 MG TABS tablet Take 1 tablet (  2.5 mg total) by mouth 2 (two) times daily. 60 tablet 3   No current facility-administered medications for this visit.    Allergies  Allergen Reactions  . Phenazopyridine Hcl Other (See Comments)    Unknown reaction  . Septra [Sulfamethoxazole-Trimethoprim] Nausea And Vomiting    GI Upset    History   Social History  . Marital Status: Married    Spouse Name: san  . Number of Children: 42  . Years of Education: 4th grade    Occupational History  . retired    Social History Main Topics  . Smoking status: Former Smoker    Quit date: 02/17/2002  . Smokeless tobacco: Never Used     Comment: Quit seven years ago   . Alcohol Use: No  . Drug Use: No  . Sexual Activity: No   Other Topics Concern  . Not on file   Social History Narrative   Patient lives at home with his daughter   Patient is right handed    Patient drinks tea and coffee     Review of Systems: General: negative for chills, fever, night sweats or weight changes.  Cardiovascular: negative for chest pain, dyspnea on exertion, edema, orthopnea, palpitations, paroxysmal nocturnal dyspnea or shortness of breath Dermatological: negative for rash Respiratory: negative for cough or wheezing Urologic: negative for hematuria Abdominal: negative for nausea, vomiting, diarrhea, bright red blood per rectum, melena, or hematemesis Neurologic: negative for visual changes, syncope, or dizziness All other systems reviewed and are otherwise negative except as noted above.    Blood pressure 122/86, pulse 90, height  (1.549 m), weight 151 lb 9.6 oz (68.765 kg).  General appearance: alert and no distress Neck: no adenopathy, no carotid bruit, no JVD, supple, symmetrical, trachea midline and thyroid not enlarged, symmetric, no tenderness/mass/nodules Lungs: clear to auscultation bilaterally Heart: regular rate and rhythm, S1, S2 normal, no murmur, click, rub or gallop Extremities: extremities normal, atraumatic, no cyanosis or edema  EKG normal sinus rhythm at 90 with nonspecific ST and T-wave changes. I personally reviewed this EKG  ASSESSMENT AND PLAN:   Ischemic cardiomyopathy History of ischemic cardiopathy with recent echo performed last month revealing EF of 40%. He did have a cardiac cath rotation performed by Dr. Swaziland last year which showed an 80% left main with an occluded dominant RCA. The consensus that time was to treat him medically  with his comorbidities and age. He does get occasional chest pain.  Hypertension History of hypertension blood pressure measured at 122/86. He is on carvedilol, isosorbide. He is not on an ACE inhibitor because of chronic renal insufficiency.  Hyperlipidemia History of hyperlipidemia on atorvastatin  40 mg a day with his last lipid profile performed 11/17/13 revealed a total cholesterol 71, LDL 27 HDL 29  Bilateral carotid artery disease History of carotid artery disease status post left internal carotid artery stenting by myself 04/28/13 with follow-up Dopplers performed 06/22/13 revealing this to be widely patent.  Atrial fibrillation- unknown duration History of PAF maintaining sinus rhythm on Xarelto oral anticoagulation      [name] [facility] 08/02/2014 2:03 PM

## 2014-08-02 NOTE — Assessment & Plan Note (Signed)
History of PAF maintaining sinus rhythm on Xarelto oral anticoagulation. °

## 2014-08-02 NOTE — Assessment & Plan Note (Addendum)
History of hyperlipidemia on atorvastatin  40 mg a day with his last lipid profile performed 11/17/13 revealed a total cholesterol 71, LDL 27 HDL 29

## 2014-08-02 NOTE — Patient Instructions (Signed)
We request that you follow-up in: 6 months with an extender and in 1 year with Dr Berry  You will receive a reminder letter in the mail two months in advance. If you don't receive a letter, please call our office to schedule the follow-up appointment.   

## 2014-08-02 NOTE — Assessment & Plan Note (Signed)
History of carotid artery disease status post left internal carotid artery stenting by myself 04/28/13 with follow-up Dopplers performed 06/22/13 revealing this to be widely patent.

## 2014-08-02 NOTE — Assessment & Plan Note (Signed)
History of hypertension blood pressure measured at 122/86. He is on carvedilol, isosorbide. He is not on an ACE inhibitor because of chronic renal insufficiency.

## 2014-10-20 ENCOUNTER — Other Ambulatory Visit: Payer: Self-pay | Admitting: Neurology

## 2014-10-23 ENCOUNTER — Other Ambulatory Visit: Payer: Self-pay | Admitting: Neurology

## 2015-01-11 ENCOUNTER — Encounter: Payer: Self-pay | Admitting: Physician Assistant

## 2015-01-11 ENCOUNTER — Ambulatory Visit (INDEPENDENT_AMBULATORY_CARE_PROVIDER_SITE_OTHER): Payer: Medicare Other | Admitting: Physician Assistant

## 2015-01-11 VITALS — BP 150/100 | HR 85 | Ht 60.0 in | Wt 152.0 lb

## 2015-01-11 DIAGNOSIS — Z7901 Long term (current) use of anticoagulants: Secondary | ICD-10-CM

## 2015-01-11 DIAGNOSIS — I480 Paroxysmal atrial fibrillation: Secondary | ICD-10-CM

## 2015-01-11 DIAGNOSIS — I5022 Chronic systolic (congestive) heart failure: Secondary | ICD-10-CM

## 2015-01-11 DIAGNOSIS — I2510 Atherosclerotic heart disease of native coronary artery without angina pectoris: Secondary | ICD-10-CM | POA: Diagnosis not present

## 2015-01-11 DIAGNOSIS — I739 Peripheral vascular disease, unspecified: Secondary | ICD-10-CM

## 2015-01-11 DIAGNOSIS — I6529 Occlusion and stenosis of unspecified carotid artery: Secondary | ICD-10-CM

## 2015-01-11 DIAGNOSIS — I10 Essential (primary) hypertension: Secondary | ICD-10-CM

## 2015-01-11 DIAGNOSIS — I2583 Coronary atherosclerosis due to lipid rich plaque: Secondary | ICD-10-CM

## 2015-01-11 DIAGNOSIS — I779 Disorder of arteries and arterioles, unspecified: Secondary | ICD-10-CM

## 2015-01-11 DIAGNOSIS — E785 Hyperlipidemia, unspecified: Secondary | ICD-10-CM

## 2015-01-11 NOTE — Patient Instructions (Signed)
Medication Instructions:  Wilburt Finlay, PA-C, has made no changes in your current medications. Please continue your medications as listed.  Labwork: NONE  Testing/Procedures: Your physician has requested that you have a carotid duplex. This test is an ultrasound of the carotid arteries in your neck. It looks at blood flow through these arteries that supply the brain with blood. Allow one hour for this exam. There are no restrictions or special instructions.  Follow-Up: Wilburt Finlay, PA-C, recommends that you schedule a follow-up appointment in 6 months with Dr Allyson Sabal. You will receive a reminder letter in the mail two months in advance. If you don't receive a letter, please call our office to schedule the follow-up appointment.  If you need a refill on your cardiac medications before your next appointment, please call your pharmacy.

## 2015-01-11 NOTE — Progress Notes (Signed)
Patient ID: Mohmad Scavuzzo, male   DOB: 1926-02-23, 81 y.o.   MRN: [id number]    Date:  01/11/2015   ID:  Achary Aragona, DOB 1926/12/17, MRN [id number]  PCP:  [name], MD  Primary Cardiologist:  Allyson Sabal   Chief Complaint  Patient presents with  . 6 MONTHS  . Chest Pain    TIGHTNESS  . Dizziness     History of Present Illness: Tzuriel Boehnke is an 81 y.o. married [place] male who has a history of congestive heart failure secondary to ischemic cardiomyopathy. Last saw Dr. Allyson Sabal in June of this year.[name]  His history includes paroxysmal atrial fibrillation.   The echocardiogram  May 2016 revealed severe dysfunction with an ejection fraction in the 40% range. He had stroke like symptoms and was evaluated by the stroke service. An MRI suggested multiple embolic strokes possibly related to the atial fibrillation.carotid Dopplers were performed and suggested high-grade left internal carotid artery stenosis with string sign". The patient underwent cardiac catheterization by Dr. Swaziland the right radial approach that revealed 80% left main stenosis with an occluded dominant RCA.. Consensus was to treat him medically for this.  Dr. Allyson Sabal was asked to see the patient for consideration of left internal carotid artery stenting. Extensive conversation was had with the patient and his family and after careful consideration of the risks and benefits of medical therapy versus percutaneous intervention, it was decided to proceed with carotid artery stenting, which was performed on 04/28/13 successfully.  His post procedure Dopplers in our office were excellent as were his most recent Dopplers in October 2015 during the hospitalization. He did have paroxysmal atrial fibrillation with strokes and has maintained sinus rhythm on Xarelto oral anticoagulation.    patient is here for six-month evaluation. Interview was conducted with an interpreter and family. Overall he is doing very well. He has some mild chest  tightness infrequently. He is not waking up short of breath nor does he have any lower extremity edema.   He also denies nausea, vomiting, fever, dizziness, PND, cough, congestion, abdominal pain, hematochezia, melena,  claudication.  Wt Readings from Last 3 Encounters:  01/11/15 152 lb (68.947 kg)  08/02/14 151 lb 9.6 oz (68.765 kg)  07/07/14 147 lb (66.679 kg)     Past Medical History  Diagnosis Date  . Hypertension   . Gout   . Hard of hearing   . Anemia   . AAA (abdominal aortic aneurysm) (HCC)   . Depression   . Hyperplasia, prostate   . Asthma   . H. pylori infection   . PUD (peptic ulcer disease)   . Hiatal hernia   . Renal failure, acute (HCC) 11/08/2012  . Carotid artery disease (HCC)   . Coronary artery disease   . CHF (congestive heart failure) (HCC)     Current Outpatient Prescriptions  Medication Sig Dispense Refill  . allopurinol (ZYLOPRIM) 300 MG tablet Take 300 mg by mouth daily.    [name] aspirin EC 81 MG tablet Take 1 tablet (81 mg total) by mouth daily. 90 tablet 3  . atorvastatin (LIPITOR) 40 MG tablet Take 1 tablet (40 mg total) by mouth daily at 6 PM. 30 tablet 5  . carvedilol (COREG) 3.125 MG tablet Take 1 tablet (3.125 mg total) by mouth 2 (two) times daily with a meal. 60 tablet 5  . CORTISPORIN-TC 3.04-12-08-0.5 MG/ML otic suspension Place 3 drops into the right ear 4 (four) times daily.     . diazepam (VALIUM) 2 MG tablet  Take 2 mg by mouth 2 (two) times daily.    . furosemide (LASIX) 20 MG tablet Take 1 tablet (20 mg total) by mouth every other day. 30 tablet 0  . ipratropium-albuterol (DUONEB) 0.5-2.5 (3) MG/3ML SOLN Take 3 mLs by nebulization 2 (two) times daily.     . isosorbide mononitrate (IMDUR) 30 MG 24 hr tablet TAKE ONE-HALF TABLET BY MOUTH DAILY 15 tablet 9  . meclizine (ANTIVERT) 12.5 MG tablet Take 1 tablet by mouth daily as needed.    . nitroGLYCERIN (NITROSTAT) 0.4 MG SL tablet Place 1 tablet (0.4 mg total) under the tongue every 5 (five)  minutes x 3 doses as needed for chest pain. 25 tablet 5  . pantoprazole (PROTONIX) 40 MG tablet Take 1 tablet (40 mg total) by mouth 2 (two) times daily. 60 tablet 5  . Rivaroxaban (XARELTO) 15 MG TABS tablet Take 15 mg by mouth daily.    . tamsulosin (FLOMAX) 0.4 MG CAPS capsule Take 0.4 mg by mouth daily.     . [DISCONTINUED] apixaban (ELIQUIS) 2.5 MG TABS tablet Take 1 tablet (2.5 mg total) by mouth 2 (two) times daily. 60 tablet 3   No current facility-administered medications for this visit.    Allergies:    Allergies  Allergen Reactions  . Phenazopyridine Hcl Other (See Comments)    Unknown reaction  . Septra [Sulfamethoxazole-Trimethoprim] Nausea And Vomiting    GI Upset    Social History:  The patient  reports that he quit smoking about 12 years ago. He has never used smokeless tobacco. He reports that he does not drink alcohol or use illicit drugs.   Family history:   Family History  Problem Relation Age of Onset  . Colon cancer Neg Hx     ROS:  Please see the history of present illness.  All other systems reviewed and negative.   PHYSICAL EXAM: VS:  BP 150/100 mmHg  Pulse 85  Ht 5' (1.524 m)  Wt 152 lb (68.947 kg)  BMI 29.69 kg/m2 Well nourished, well developed, in no acute distress HEENT: Pupils are equal round react to light accommodation extraocular movements are intact.  Neck: no JVDNo cervical lymphadenopathy. Cardiac: Regular rate and rhythm without murmurs rubs or gallops. Lungs:  clear to auscultation bilaterally, no wheezing, rhonchi or rales Abd: soft, nontender, positive bowel sounds all quadrants, no hepatosplenomegaly Ext: no lower extremity edema.  2+ radial and dorsalis pedis pulses. Skin: warm and dry Neuro:  Grossly normal  EKG:   Sinus rhythm, first-degree AV block, prolonged QT interval 464 ms rate 85 bpm changes from prior   ASSESSMENT AND PLAN:  Problem List Items Addressed This Visit    PVD (peripheral vascular disease) 3.5cm AAA Aug  2014 (Chronic)   Paroxysmal atrial fibrillation (HCC)   Hypertension - Primary (Chronic)   Relevant Orders   EKG 12-Lead   Hyperlipidemia   Coronary artery disease   Chronic systolic heart failure, echocardiogram March 2015: ejection fraction 35%,    Chronic anticoagulation   Bilateral carotid artery disease (HCC)    Other Visit Diagnoses    Carotid artery stenosis, unspecified laterality        Relevant Orders    VAS US CAROTID       overall patient appears to be doing very well.  1. Essential hypertension  blood pressure elevated 150/100. Patient's   Family will check blood pressure for the next week at home. When he comes in for carotid Dopplers will monitor scheduled to have  his blood pressure checked. Consider either increasing Coreg or adding hydralazine.  - EKG 12-Lead  2. Paroxysmal atrial fibrillation (HCC)   maintaining sinus rhythm  3. Coronary artery disease due to lipid rich plaque   he complains of very mild chest tightness infrequently. No further workup at this time.  4. Chronic systolic heart failure, echocardiogram March 2015: ejection fraction 35%,  appears euvolemic.  Weight is stable from 6 months ago. Not on an  ACE inhibitor due to chronic kidney disease. Will consider adding hydralazine at nurse visit for blood pressure. He is on long-acting nitrates.  5. Carotid artery stenosis, unspecified laterality   we'll order one year surveillance Dopplers.  - VAS US CAROTID; Future  6. Hyperlipidemia   continue statin  7. PVD (peripheral vascular disease)    carotid Dopplers as above  9. Chronic anticoagulation   continue Xarelto renally dosed.

## 2015-01-27 ENCOUNTER — Other Ambulatory Visit: Payer: Self-pay | Admitting: Neurology

## 2015-01-28 ENCOUNTER — Other Ambulatory Visit: Payer: Self-pay

## 2015-01-28 DIAGNOSIS — I482 Chronic atrial fibrillation, unspecified: Secondary | ICD-10-CM

## 2015-01-28 DIAGNOSIS — I6340 Cerebral infarction due to embolism of unspecified cerebral artery: Secondary | ICD-10-CM

## 2015-02-19 ENCOUNTER — Inpatient Hospital Stay (HOSPITAL_COMMUNITY): Admission: RE | Admit: 2015-02-19 | Payer: Medicare Other | Source: Ambulatory Visit

## 2015-02-19 ENCOUNTER — Ambulatory Visit (HOSPITAL_COMMUNITY)
Admission: RE | Admit: 2015-02-19 | Discharge: 2015-02-19 | Disposition: A | Discharge status: Home / Self Care | Payer: Medicare Other | Source: Ambulatory Visit | Attending: Physician Assistant | Admitting: Physician Assistant

## 2015-02-19 DIAGNOSIS — R42 Dizziness and giddiness: Secondary | ICD-10-CM | POA: Diagnosis not present

## 2015-02-19 DIAGNOSIS — I10 Essential (primary) hypertension: Secondary | ICD-10-CM | POA: Insufficient documentation

## 2015-02-19 DIAGNOSIS — I6529 Occlusion and stenosis of unspecified carotid artery: Secondary | ICD-10-CM

## 2015-02-19 DIAGNOSIS — I6523 Occlusion and stenosis of bilateral carotid arteries: Secondary | ICD-10-CM | POA: Insufficient documentation

## 2015-07-27 ENCOUNTER — Observation Stay (HOSPITAL_COMMUNITY)
Admission: EM | Admit: 2015-07-27 | Discharge: 2015-07-29 | Disposition: A | Discharge status: Home / Self Care | Payer: Medicare Other | Attending: Internal Medicine | Admitting: Internal Medicine

## 2015-07-27 ENCOUNTER — Encounter (HOSPITAL_COMMUNITY): Payer: Self-pay | Admitting: Emergency Medicine

## 2015-07-27 ENCOUNTER — Observation Stay (HOSPITAL_COMMUNITY): Payer: Medicare Other

## 2015-07-27 ENCOUNTER — Emergency Department (HOSPITAL_COMMUNITY): Payer: Medicare Other

## 2015-07-27 DIAGNOSIS — I130 Hypertensive heart and chronic kidney disease with heart failure and stage 1 through stage 4 chronic kidney disease, or unspecified chronic kidney disease: Secondary | ICD-10-CM | POA: Diagnosis not present

## 2015-07-27 DIAGNOSIS — N183 Chronic kidney disease, stage 3 unspecified: Secondary | ICD-10-CM

## 2015-07-27 DIAGNOSIS — I712 Thoracic aortic aneurysm, without rupture, unspecified: Secondary | ICD-10-CM

## 2015-07-27 DIAGNOSIS — I255 Ischemic cardiomyopathy: Secondary | ICD-10-CM | POA: Diagnosis not present

## 2015-07-27 DIAGNOSIS — I480 Paroxysmal atrial fibrillation: Secondary | ICD-10-CM | POA: Diagnosis not present

## 2015-07-27 DIAGNOSIS — F329 Major depressive disorder, single episode, unspecified: Secondary | ICD-10-CM | POA: Insufficient documentation

## 2015-07-27 DIAGNOSIS — M109 Gout, unspecified: Secondary | ICD-10-CM | POA: Insufficient documentation

## 2015-07-27 DIAGNOSIS — Z8673 Personal history of transient ischemic attack (TIA), and cerebral infarction without residual deficits: Secondary | ICD-10-CM | POA: Diagnosis not present

## 2015-07-27 DIAGNOSIS — Z7901 Long term (current) use of anticoagulants: Secondary | ICD-10-CM | POA: Insufficient documentation

## 2015-07-27 DIAGNOSIS — I5042 Chronic combined systolic (congestive) and diastolic (congestive) heart failure: Secondary | ICD-10-CM | POA: Insufficient documentation

## 2015-07-27 DIAGNOSIS — Z7982 Long term (current) use of aspirin: Secondary | ICD-10-CM | POA: Diagnosis not present

## 2015-07-27 DIAGNOSIS — J189 Pneumonia, unspecified organism: Secondary | ICD-10-CM | POA: Diagnosis not present

## 2015-07-27 DIAGNOSIS — I739 Peripheral vascular disease, unspecified: Secondary | ICD-10-CM

## 2015-07-27 DIAGNOSIS — I2510 Atherosclerotic heart disease of native coronary artery without angina pectoris: Secondary | ICD-10-CM | POA: Diagnosis present

## 2015-07-27 DIAGNOSIS — R079 Chest pain, unspecified: Secondary | ICD-10-CM | POA: Diagnosis not present

## 2015-07-27 DIAGNOSIS — N400 Enlarged prostate without lower urinary tract symptoms: Secondary | ICD-10-CM | POA: Diagnosis not present

## 2015-07-27 DIAGNOSIS — Z87891 Personal history of nicotine dependence: Secondary | ICD-10-CM | POA: Diagnosis not present

## 2015-07-27 DIAGNOSIS — E785 Hyperlipidemia, unspecified: Secondary | ICD-10-CM | POA: Diagnosis not present

## 2015-07-27 DIAGNOSIS — M542 Cervicalgia: Secondary | ICD-10-CM | POA: Diagnosis present

## 2015-07-27 DIAGNOSIS — I779 Disorder of arteries and arterioles, unspecified: Secondary | ICD-10-CM

## 2015-07-27 DIAGNOSIS — I10 Essential (primary) hypertension: Secondary | ICD-10-CM | POA: Diagnosis present

## 2015-07-27 DIAGNOSIS — R221 Localized swelling, mass and lump, neck: Secondary | ICD-10-CM

## 2015-07-27 LAB — BASIC METABOLIC PANEL
Anion gap: 9 (ref 5–15)
BUN: 17 mg/dL (ref 6–20)
CO2: 21 mmol/L — ABNORMAL LOW (ref 22–32)
CREATININE: 1.9 mg/dL — AB (ref 0.61–1.24)
Calcium: 9.3 mg/dL (ref 8.9–10.3)
Chloride: 104 mmol/L (ref 101–111)
GFR calc Af Amer: 35 mL/min — ABNORMAL LOW (ref >60)
GFR calc non Af Amer: 30 mL/min — ABNORMAL LOW (ref >60)
Glucose, Bld: 147 mg/dL — ABNORMAL HIGH (ref 65–99)
POTASSIUM: 4.7 mmol/L (ref 3.5–5.1)
Sodium: 134 mmol/L — ABNORMAL LOW (ref 135–145)

## 2015-07-27 LAB — STREP PNEUMONIAE URINARY ANTIGEN: Strep Pneumo Urinary Antigen: NEGATIVE

## 2015-07-27 LAB — CBC
HEMATOCRIT: 32.8 % — AB (ref 39.0–52.0)
Hemoglobin: 10.7 g/dL — ABNORMAL LOW (ref 13.0–17.0)
MCH: 28.6 pg (ref 26.0–34.0)
MCHC: 32.6 g/dL (ref 30.0–36.0)
MCV: 87.7 fL (ref 78.0–100.0)
PLATELETS: 187 10*3/uL (ref 150–400)
RBC: 3.74 MIL/uL — ABNORMAL LOW (ref 4.22–5.81)
RDW: 15.8 % — AB (ref 11.5–15.5)
WBC: 7.4 10*3/uL (ref 4.0–10.5)

## 2015-07-27 LAB — BRAIN NATRIURETIC PEPTIDE: B NATRIURETIC PEPTIDE 5: 71.2 pg/mL (ref 0.0–100.0)

## 2015-07-27 LAB — I-STAT TROPONIN, ED: TROPONIN I, POC: 0.01 ng/mL (ref 0.00–0.08)

## 2015-07-27 LAB — TROPONIN I

## 2015-07-27 MED ORDER — RIVAROXABAN 15 MG PO TABS
15.0000 mg | ORAL_TABLET | Freq: Every day | ORAL | Status: DC
  Administered 2015-07-28: 15 mg via ORAL
  Filled 2015-07-27 (×2): qty 1

## 2015-07-27 MED ORDER — ATORVASTATIN CALCIUM 40 MG PO TABS
40.0000 mg | ORAL_TABLET | Freq: Every day | ORAL | Status: DC
  Administered 2015-07-27 – 2015-07-28 (×2): 40 mg via ORAL
  Filled 2015-07-27 (×2): qty 1

## 2015-07-27 MED ORDER — ACETAMINOPHEN 325 MG PO TABS
650.0000 mg | ORAL_TABLET | ORAL | Status: DC | PRN
  Filled 2015-07-27: qty 2

## 2015-07-27 MED ORDER — MORPHINE SULFATE (PF) 2 MG/ML IV SOLN: 2.0000 mg | INTRAVENOUS | Status: DC | PRN

## 2015-07-27 MED ORDER — PREDNISONE 20 MG PO TABS
60.0000 mg | ORAL_TABLET | Freq: Once | ORAL | Status: AC
  Administered 2015-07-27: 60 mg via ORAL
  Filled 2015-07-27: qty 3

## 2015-07-27 MED ORDER — SODIUM CHLORIDE 0.9% FLUSH
3.0000 mL | Freq: Two times a day (BID) | INTRAVENOUS | Status: DC
  Administered 2015-07-27 – 2015-07-29 (×4): 3 mL via INTRAVENOUS

## 2015-07-27 MED ORDER — ACETAMINOPHEN 650 MG RE SUPP: 650.0000 mg | Freq: Four times a day (QID) | RECTAL | Status: DC | PRN

## 2015-07-27 MED ORDER — NITROGLYCERIN 0.4 MG SL SUBL: 0.4000 mg | SUBLINGUAL_TABLET | SUBLINGUAL | Status: DC | PRN

## 2015-07-27 MED ORDER — POLYETHYLENE GLYCOL 3350 17 G PO PACK: 17.0000 g | PACK | Freq: Every day | ORAL | Status: DC | PRN

## 2015-07-27 MED ORDER — DEXTROSE 5 % IV SOLN
1.0000 g | INTRAVENOUS | Status: DC
  Administered 2015-07-27 – 2015-07-29 (×3): 1 g via INTRAVENOUS
  Filled 2015-07-27 (×3): qty 10

## 2015-07-27 MED ORDER — CARVEDILOL 3.125 MG PO TABS
3.1250 mg | ORAL_TABLET | Freq: Two times a day (BID) | ORAL | Status: DC
  Administered 2015-07-27 – 2015-07-29 (×3): 3.125 mg via ORAL
  Filled 2015-07-27 (×4): qty 1

## 2015-07-27 MED ORDER — TRAZODONE HCL 50 MG PO TABS
25.0000 mg | ORAL_TABLET | Freq: Every evening | ORAL | Status: DC | PRN
  Administered 2015-07-27 – 2015-07-28 (×2): 25 mg via ORAL
  Filled 2015-07-27 (×2): qty 1

## 2015-07-27 MED ORDER — ISOSORBIDE MONONITRATE ER 30 MG PO TB24
15.0000 mg | ORAL_TABLET | Freq: Every day | ORAL | Status: DC
  Administered 2015-07-27 – 2015-07-29 (×2): 15 mg via ORAL
  Filled 2015-07-27 (×3): qty 1

## 2015-07-27 MED ORDER — ONDANSETRON HCL 4 MG/2ML IJ SOLN: 4.0000 mg | Freq: Four times a day (QID) | INTRAMUSCULAR | Status: DC | PRN

## 2015-07-27 MED ORDER — METOCLOPRAMIDE HCL 5 MG/ML IJ SOLN
10.0000 mg | Freq: Once | INTRAMUSCULAR | Status: AC
  Administered 2015-07-27: 10 mg via INTRAVENOUS
  Filled 2015-07-27: qty 2

## 2015-07-27 MED ORDER — ASPIRIN EC 81 MG PO TBEC
81.0000 mg | DELAYED_RELEASE_TABLET | Freq: Every day | ORAL | Status: DC
  Administered 2015-07-27 – 2015-07-29 (×3): 81 mg via ORAL
  Filled 2015-07-27 (×4): qty 1

## 2015-07-27 MED ORDER — ACETAMINOPHEN 325 MG PO TABS: 650.0000 mg | ORAL_TABLET | Freq: Four times a day (QID) | ORAL | Status: DC | PRN

## 2015-07-27 MED ORDER — FAMOTIDINE IN NACL 20-0.9 MG/50ML-% IV SOLN
20.0000 mg | Freq: Two times a day (BID) | INTRAVENOUS | Status: DC
  Administered 2015-07-27: 20 mg via INTRAVENOUS
  Filled 2015-07-27 (×2): qty 50

## 2015-07-27 MED ORDER — IPRATROPIUM-ALBUTEROL 0.5-2.5 (3) MG/3ML IN SOLN
3.0000 mL | Freq: Once | RESPIRATORY_TRACT | Status: AC
  Administered 2015-07-27: 3 mL via RESPIRATORY_TRACT
  Filled 2015-07-27: qty 3

## 2015-07-27 MED ORDER — PANTOPRAZOLE SODIUM 40 MG PO TBEC
40.0000 mg | DELAYED_RELEASE_TABLET | Freq: Two times a day (BID) | ORAL | Status: DC
  Administered 2015-07-27 – 2015-07-29 (×5): 40 mg via ORAL
  Filled 2015-07-27 (×5): qty 1

## 2015-07-27 MED ORDER — TAMSULOSIN HCL 0.4 MG PO CAPS
0.4000 mg | ORAL_CAPSULE | Freq: Every day | ORAL | Status: DC
  Administered 2015-07-27 – 2015-07-29 (×3): 0.4 mg via ORAL
  Filled 2015-07-27 (×4): qty 1

## 2015-07-27 MED ORDER — ALLOPURINOL 300 MG PO TABS
300.0000 mg | ORAL_TABLET | Freq: Every day | ORAL | Status: DC
  Administered 2015-07-27 – 2015-07-29 (×3): 300 mg via ORAL
  Filled 2015-07-27 (×3): qty 1

## 2015-07-27 MED ORDER — DEXTROSE 5 % IV SOLN
500.0000 mg | INTRAVENOUS | Status: DC
  Administered 2015-07-27 – 2015-07-29 (×3): 500 mg via INTRAVENOUS
  Filled 2015-07-27 (×3): qty 500

## 2015-07-27 NOTE — ED Provider Notes (Signed)
CSN: [id number]     Arrival date & time 07/27/15  0538 History   First MD Initiated Contact with Patient 07/27/15 [phone]     Chief Complaint  Patient presents with  . Chest Pain   HPI   [name] is a 82 y.o. male PMH significant for asthma, HTN, PUD, H. Pylori infection, CHF presenting with a 1 week history of centralized chest pain and left sided neck pain. He describes his pain as centralized in location, nonradiating, tightness, constant, nonexertional. He endorses nonexertional shortness of breath, dizziness, and nausea. He mentions he has left sided neck pain and his family is concerned because he had a left carotid stent placed 2 years ago. Coughing worsens his neck pain and it is nonradiating. No fevers, chills, emesis, HA, decreased ROM at neck, changes in bowel/bladder habits.   History provided by family member at bedside (patient is [place]).   Past Medical History  Diagnosis Date  . Hypertension   . Gout   . Hard of hearing   . Anemia   . AAA (abdominal aortic aneurysm) (HCC)   . Depression   . Hyperplasia, prostate   . Asthma   . H. pylori infection   . PUD (peptic ulcer disease)   . Hiatal hernia   . Renal failure, acute (HCC) 11/08/2012  . Carotid artery disease (HCC)   . Coronary artery disease   . CHF (congestive heart failure) [facility])    Past Surgical History  Procedure Laterality Date  . Esophagogastroduodenoscopy  06/13/2008,12/31/12  . Carotid stent insertion  2015  . Left heart catheterization with coronary angiogram N/A 05/04/2013    Procedure: LEFT HEART CATHETERIZATION WITH CORONARY ANGIOGRAM;  Surgeon: Peter M Swaziland, MD;  Location: [facility] CATH LAB;  Service: Cardiovascular;  Laterality: N/A;  . Carotid stent insertion N/A 05/09/2013    Procedure: CAROTID STENT INSERTION;  Surgeon: Runell Gess, MD;  Location[facility] C[facility] LAB;  Service: Cardiovascular;  Laterality: N/A;   Family History  Problem Relation Age of Onset  . Colon cancer Neg Hx    Social  History  Substance Use Topics  . Smoking status: Former Smoker    Quit date: 02/17/2002  . Smokeless tobacco: Never Used     Comment: Quit seven years ago   . Alcohol Use: No    Review of Systems  Ten systems are reviewed and are negative for acute change except as noted in the HPI  Allergies  Phenazopyridine hcl and Septra  Home Medications   Prior to Admission medications   Medication Sig Start Date End Date Taking? Authorizing Provider  allopurinol (ZYLOPRIM) 300 MG tablet Take 300 mg by mouth daily.   Yes Historical Provider, MD  aspirin EC 81 MG tablet Take 1 tablet (81 mg total) by mouth daily. 09/08/13  Yes Marvel Plan, MD  atorvastatin (LIPITOR) 40 MG tablet Take 1 tablet (40 mg total) by mouth daily at 6 PM. 05/11/13  Yes Brittainy Sherlynn Carbon, PA-C  carvedilol (COREG) 3.125 MG tablet Take 1 tablet (3.125 mg total) by mouth 2 (two) times daily with a meal. 06/19/14  Yes Vassie Loll, MD  isosorbide mononitrate (IMDUR) 30 MG 24 hr tablet TAKE ONE-HALF TABLET BY MOUTH DAILY 03/15/14  Yes Runell Gess, MD  nitroGLYCERIN (NITROSTAT) 0.4 MG SL tablet Place 1 tablet (0.4 mg total) under the tongue every 5 (five) minutes x 3 doses as needed for chest pain. 05/11/13  Yes Brittainy Sherlynn Carbon, PA-C  pantoprazole (PROTONIX) 40 MG tablet Take  1 tablet (40 mg total) by mouth 2 (two) times daily. 11/19/13  Yes Vassie Loll, MD  Rivaroxaban (XARELTO) 15 MG TABS tablet Take 15 mg by mouth daily.   Yes Historical Provider, MD  tamsulosin (FLOMAX) 0.4 MG CAPS capsule Take 0.4 mg by mouth daily.    Yes Historical Provider, MD  furosemide (LASIX) 20 MG tablet Take 1 tablet (20 mg total) by mouth every other day. Patient not taking: Reported on 07/27/2015 06/19/14   Vassie Loll, MD   BP 132/77 mmHg  Pulse 87  Temp(Src) 97.5 F (36.4 C) (Oral)  Resp 19  SpO2 100% Physical Exam  Constitutional: He appears well-developed and well-nourished. No distress.  Elderly, frail appearing  HENT:  Head:  Normocephalic and atraumatic.  Mouth/Throat: Oropharynx is clear and moist. No oropharyngeal exudate.  Eyes: Conjunctivae are normal. Pupils are equal, round, and reactive to light. Right eye exhibits no discharge. Left eye exhibits no discharge. No scleral icterus.  Neck: No tracheal deviation present.  Cardiovascular: Normal rate, regular rhythm, normal heart sounds and intact distal pulses.  Exam reveals no gallop and no friction rub.   No murmur heard. Pulmonary/Chest: Effort normal. No respiratory distress. He has no wheezes. He has rales. He exhibits no tenderness.  Rales BL lower lobes  Abdominal: Soft. Bowel sounds are normal. He exhibits no distension and no mass. There is no tenderness. There is no rebound and no guarding.  Musculoskeletal: He exhibits no edema.  Lymphadenopathy:    He has no cervical adenopathy.  Neurological: He is alert. Coordination normal.  Skin: Skin is warm and dry. No rash noted. He is not diaphoretic. No erythema.  Psychiatric: He has a normal mood and affect. His behavior is normal.  Nursing note and vitals reviewed.  ED Course  Procedures  Labs Review Labs Reviewed  BASIC METABOLIC PANEL - Abnormal; Notable for the following:    Sodium 134 (*)    CO2 21 (*)    Glucose, Bld 147 (*)    Creatinine, Ser 1.90 (*)    GFR calc non Af Amer 30 (*)    GFR calc Af Amer 35 (*)    All other components within normal limits  CBC - Abnormal; Notable for the following:    RBC 3.74 (*)    Hemoglobin 10.7 (*)    HCT 32.8 (*)    RDW 15.8 (*)    All other components within normal limits  BRAIN NATRIURETIC PEPTIDE  I-STAT TROPOININ, ED   Imaging Review Dg Chest 2 View  07/27/2015  CLINICAL DATA:  Chest tightness, shortness of breath, dizziness, and cough for 5 days. EXAM: CHEST  2 VIEW COMPARISON:  07/07/2014 FINDINGS: New linear infiltration in the left lung base could represent atelectasis or pneumonia. Slight fibrosis in the right lung base is unchanged.  Mild cardiac enlargement without vascular congestion. Calcified aorta. No blunting of costophrenic angles. No pneumothorax. Degenerative changes in the spine. IMPRESSION: New infiltration or atelectasis in the left lung base. Electronically Signed   By: Burman Nieves M.D.   On: 07/27/2015 06:33   I have personally reviewed and evaluated these images and lab results as part of my medical decision-making.   EKG Interpretation None      MDM   Final diagnoses:  Chest pain, unspecified chest pain type   Patient nontoxic appearing, VSS. Performed broad workup for infectious vs cardiac.  BNP, CBC, BMP, troponin, EKG unremarkable for acute change. CXR with pneumonia. Patient will need admission for CAP  and cardiac workup.  Hospitalist agrees to admission. Patient in understanding and agreement with the plan.   Melton Krebs, PA-C 07/30/15 1125  Geoffery Lyons, MD 08/02/15 [phone]

## 2015-07-27 NOTE — ED Notes (Signed)
Attempted report 

## 2015-07-27 NOTE — ED Notes (Signed)
Pt ambulatory to restroom with 1 person assist; son assisting patient to restroom; steady gait noted

## 2015-07-27 NOTE — Care Management Note (Signed)
Case Management Note  Patient Details  Name: [name] MRN: [id number] Date of Birth: March 04, 1926  Subjective/Objective:                  82 y.o. male PMH significant for asthma, HTN, PUD, H. Pylori infection, CHF presenting with a 1 week history of centralized chest pain and left sided neck pain./ From home with family; pt and wife do not speak [place]- [name] -phone [phone]) does.   Action/Plan: Follow for disposition needs.   Expected Discharge Date:  07/28/15               Expected Discharge Plan:  Home w Home Health Services  In-House Referral:     Discharge planning Services  CM Consult  Post Acute Care Choice:    Choice offered to:     DME Arranged:    DME Agency:     HH Arranged:    HH Agency:     Status of Service:  In process, will continue to follow  Medicare Important Message Given:    Date Medicare IM Given:    Medicare IM give by:    Date Additional Medicare IM Given:    Additional Medicare Important Message give by:     If discussed at Long Length of Stay Meetings, dates discussed:    Additional Comments: Pt was set up with [facility] RN with [facility] on last admission.  [facility] made one visit and pt refused return visits, so pt was discharged from agency. Oletta Cohn, RN 07/27/2015, 9:39 AM

## 2015-07-27 NOTE — ED Notes (Signed)
Pt. reports central chest pain /  tightness radiating to left neck with SOB , dry cough and dizziness onset last week , his cardiologist is Dr. Erlene Quan.

## 2015-07-27 NOTE — Progress Notes (Signed)
Pt admitted to 5W 07 from ED. Family at bedside. Call bell within reach.  Will continue to monitor.

## 2015-07-27 NOTE — ED Notes (Signed)
Pt ambulated to and from restroom; this tech placed pt back on monitor via bp, 5-lead, and pulse ox upon return from restroom

## 2015-07-27 NOTE — ED Notes (Signed)
Admitting at bedside 

## 2015-07-27 NOTE — ED Notes (Signed)
Pt provided with water upon request per NP

## 2015-07-27 NOTE — ED Notes (Signed)
IV antibiotics not compatible with IV pepcid. Will start ABX after pepcid infusion complete.

## 2015-07-27 NOTE — ED Notes (Signed)
Pt. Family requesting pt. Take his home medication. Pt. Family told that we would wait for admitting team to come before pt. Takes home meds.

## 2015-07-27 NOTE — ED Notes (Signed)
Patient transported to X-ray 

## 2015-07-27 NOTE — ED Provider Notes (Signed)
Medical screening examination/treatment/procedure(s) were conducted as a shared visit with non-physician practitioner(s) and myself.  I personally evaluated the patient during the encounter.   EKG Interpretation None      82 year old [place] speaking male with history of CAD, hypertension, and PUD who presents with chest pain. History is obtained by patient's wife who states that he has been complaining of chest pain and left-sided neck pain for 1 week. It is associated with mild cough and shortness of breath, but no fevers or chills. Pain not worsened with activity. He is afebrile, hemodynamically stable, and breathing comfortably on room air with normal oxygenation. Chest x-ray is suggestive of possible pneumonia. He is covered for community-acquired pneumonia. His EKG is not acutely ischemic and troponin 1 is negative. Admitted to hospitalist service for treatment of CAP and cardiac work-up.  Lavera Guise, MD 07/27/15 [phone]

## 2015-07-27 NOTE — H&P (Signed)
History and Physical    [name] ZOX:[id number] DOB: Aug 20, 1926 DOA: 07/27/2015  PCP: [name], MD  Patient coming from:   home    Chief Complaint: chest pain, left neck pain  HPI: [name] is a 82 y.o. male , non English speaking from [place]. His daughter is here and provides history. Patient has multiple medical problems not limited to CAD, HTN, paroxysmal atrial fibrillation on Xarelto , embolic CVAs (afib), PVD s/p left internal carotid artery stenting March 2015,  CAD /cath 2016 revealed 80% left main with an occluded domant RCA treated medically. He has chronic systolic CHF secondary to ischemic cardiomyopathy.    Patient has chronic intermittent centralized chest pain. Pain has been the same for years and sometimes alleviated with acid blockers. Daughter thinks the pain was better controlled when patient was on Nexium. At some point he was switched to Protonix and has also been taking Maalox without resolution of the pain.  Patient comes to the emergency department now for evaluation of the chest discomfort as well as dyspnea and left-sided neck pain. The left-sided neck pain is also chronic intermittent though over the last month it's been present almost daily. The neck pain is exacerbated with swallowing and coughing. Per daughter patient has been coughing, mainly at night. The cough is nonproductive. Patient less mobile recently secondary to dyspnea. Overall, I think patient is here in ED because he / family concerned because the left neck pain is persistent and patient seems more short of breath recently. His chest pain has not really changed in any way.   ED Course:  Afebrile, tachypnea with respirations up to 30 on arrival. Normal heart rate, O2 saturations upper nineties on room air Prednisone 60 mg, Reglan and Duoneb EKG:  Sinus rhythm with sinus arrhythmia with 1st degree A-V block Left axis deviation Nonspecific T wave abnormality Prolonged  QT Abnormal ECG  Review of Systems: As per HPI, otherwise 10 point review of systems negative -obtained by daughter who is assisting with translation / interpretation. .    Past Medical History  Diagnosis Date  . Hypertension   . Gout   . Hard of hearing   . Anemia   . AAA (abdominal aortic aneurysm) (HCC)   . Depression   . Hyperplasia, prostate   . Asthma   . H. pylori infection   . PUD (peptic ulcer disease)   . Hiatal hernia   . Renal failure, acute (HCC) 11/08/2012  . Carotid artery disease (HCC)   . Coronary artery disease   . CHF (congestive heart failure) [facility])     Past Surgical History  Procedure Laterality Date  . Esophagogastroduodenoscopy  06/13/2008,12/31/12  . Carotid stent insertion  2015  . Left heart catheterization with coronary angiogram N/A 05/04/2013    Procedure: LEFT HEART CATHETERIZATION WITH CORONARY ANGIOGRAM;  Surgeon: Peter M Swaziland, MD;  Location: [facility] CATH LAB;  Service: Cardiovascular;  Laterality: N/A;  . Carotid stent insertion N/A 05/09/2013    Procedure: CAROTID STENT INSERTION;  Surgeon: Runell Gess, MD;  Location: [facility] CATH LAB;  Service: Cardiovascular;  Laterality: N/A;    Social History   Social History  . Marital Status: Married    Spouse Name: san  . Number of Children: 55  . Years of Education: 4th grade   Occupational History  . retired    Social History Main Topics  . Smoking status: Former Smoker    Quit date: 02/17/2002  . Smokeless tobacco: Never Used  Comment: Quit seven years ago   . Alcohol Use: No  . Drug Use: No  . Sexual Activity: No   Other Topics Concern  . Not on file   Social History Narrative   Patient lives at home with his daughter   Patient is right handed    Patient drinks tea and coffee   Lives at home with wife. No assistive devices needed for ambulation  Allergies  Allergen Reactions  . Phenazopyridine Hcl Other (See Comments)    Unknown reaction  . Septra  [Sulfamethoxazole-Trimethoprim] Nausea And Vomiting    GI Upset    Family History  Problem Relation Age of Onset  . Colon cancer Neg Hx     Prior to Admission medications   Medication Sig Start Date End Date Taking? Authorizing Provider  allopurinol (ZYLOPRIM) 300 MG tablet Take 300 mg by mouth daily.   Yes Historical Provider, MD  aspirin EC 81 MG tablet Take 1 tablet (81 mg total) by mouth daily. 09/08/13  Yes Marvel Plan, MD  atorvastatin (LIPITOR) 40 MG tablet Take 1 tablet (40 mg total) by mouth daily at 6 PM. 05/11/13  Yes Brittainy Sherlynn Carbon, PA-C  carvedilol (COREG) 3.125 MG tablet Take 1 tablet (3.125 mg total) by mouth 2 (two) times daily with a meal. 06/19/14  Yes Vassie Loll, MD  isosorbide mononitrate (IMDUR) 30 MG 24 hr tablet TAKE ONE-HALF TABLET BY MOUTH DAILY 03/15/14  Yes Runell Gess, MD  nitroGLYCERIN (NITROSTAT) 0.4 MG SL tablet Place 1 tablet (0.4 mg total) under the tongue every 5 (five) minutes x 3 doses as needed for chest pain. 05/11/13  Yes Brittainy Sherlynn Carbon, PA-C  pantoprazole (PROTONIX) 40 MG tablet Take 1 tablet (40 mg total) by mouth 2 (two) times daily. 11/19/13  Yes Vassie Loll, MD  Rivaroxaban (XARELTO) 15 MG TABS tablet Take 15 mg by mouth daily.   Yes Historical Provider, MD  tamsulosin (FLOMAX) 0.4 MG CAPS capsule Take 0.4 mg by mouth daily.    Yes Historical Provider, MD  furosemide (LASIX) 20 MG tablet Take 1 tablet (20 mg total) by mouth every other day. Patient not taking: Reported on 07/27/2015 06/19/14   Vassie Loll, MD    Physical Exam: Filed Vitals:   07/27/15 0758 07/27/15 0830 07/27/15 0857 07/27/15 0915  BP: 151/88 142/83 132/84 135/92  Pulse: 88 84 93 92  Temp:      TempSrc:      Resp: 22 24 27 21   SpO2: 100% 97% 100% 98%    Constitutional:  Well developed Asian male in NAD, calm, comfortable Filed Vitals:   07/27/15 0758 07/27/15 0830 07/27/15 0857 07/27/15 0915  BP: 151/88 142/83 132/84 135/92  Pulse: 88 84 93 92  Temp:       TempSrc:      Resp: 22 24 27 21   SpO2: 100% 97% 100% 98%   Eyes: PER, lids and conjunctivae normal ENMT: Mucous membranes are moist. Posterior pharynx clear of any exudate or lesions.Edentulous Neck: normal, supple, no masses Respiratory: clear to auscultation bilaterally, no wheezing, no crackles. Normal respiratory effort. No accessory muscle use.  Cardiovascular: Regular rate and rhythm.  No extremity edema. 2+ pedal pulses.  Abdomen: no tenderness, no masses palpated. No hepatomegaly. Bowel sounds positive.  Musculoskeletal: no clubbing / cyanosis. No joint deformity upper and lower extremities. Good ROM, no contractures. Normal muscle tone.  Skin: no rashes, lesions, ulcers.  Neurologic: CN 2-12 grossly intact. Sensation intact, Strength 5/5 in all 4.  Psychiatric: Normal judgment and insight. Alert and oriented x 3. Normal mood.   Labs on Admission: I have personally reviewed following labs and imaging studies  Radiological Exams on Admission: Dg Chest 2 View  07/27/2015  CLINICAL DATA:  Chest tightness, shortness of breath, dizziness, and cough for 5 days. EXAM: CHEST  2 VIEW COMPARISON:  07/07/2014 FINDINGS: New linear infiltration in the left lung base could represent atelectasis or pneumonia. Slight fibrosis in the right lung base is unchanged. Mild cardiac enlargement without vascular congestion. Calcified aorta. No blunting of costophrenic angles. No pneumothorax. Degenerative changes in the spine. IMPRESSION: New infiltration or atelectasis in the left lung base. Electronically Signed   By: Burman Nieves M.D.   On: 07/27/2015 06:33   Echo May 2016 Study Conclusions  - Left ventricle: The cavity size was normal. There was moderate  focal basal and mild concentric hypertrophy. Systolic function  was mildly to moderately reduced. The estimated ejection fraction  was 40%. Diffuse hypokinesis. There was a reduced contribution of  atrial contraction to ventricular filling,  due to increased  ventricular diastolic pressure or atrial contractile dysfunction.  Doppler parameters are consistent with a reversible restrictive  pattern, indicative of decreased left ventricular diastolic  compliance and/or increased left atrial pressure (grade 3  diastolic dysfunction). Doppler parameters are consistent with  high ventricular filling pressure. - Aortic valve: Moderate thickening and calcification, consistent  with sclerosis. - Mitral valve: There was mild regurgitation. - Tricuspid valve: There was trivial regurgitation.  Assessment and Plan  Nonexertional chest pain, chronic / intermittent. HEART score 4. POC trop negative. Differential includes  GERD, MSK pain, ACS (less likely).  He has known coronary artery disease.    -Place in Obs - Telemetry -chest pain order set utilized - Cycle troponins  - A1c & lipid panel - Continue daily aspirin - Continue Coreg and home statin - NTG prn chest pain - Continue home bid PPI  CAP.  New infiltrate vrs atelectasis in left lung base on CXR. No 02 requirement (sats upper 90s on room air but he is tachypneic in ED  -CAP order set utilized -IV Rocephin and Azithromycin -prn 02 -follow up blood and sputum cx, gram stain, strep pneumo urinary ag, HIV antibody  Neck pain on left side, chronic / intermittent, exacerbated by cough and swallowing. He points to localized area just left of trachea. No masses felt. Etiology unclear.  -CT scan soft tissue neck  Paroxysmal atrial fibrillation (HCC). Rate controlled. Chadsvasc 7. On chronic anticoagulant - continue Xarelto  Hypertension, fluctuating in ED but overall controlled -Continue carvedilol, isosorbide, lasix  Ischemic cardiomyopathy / chronic combined systolic and diastolic heart failure. Last Echo May 2016 with EF 40% and grade 3 diastolic dysfunction.  No evidence for volume overload. -continue home coreg -I +0, daily weights    CKD (chronic kidney disease),  stage III. Cr 1.9, about same as in late May -avoid nephrotoxic medications -dose reduction of antibiotics per pharmacy -am bmet    Carotid artery disease (HCC). Status post left internal carotid artery stenting May 2015     Hyperlipidemia.  -continue home statin  Gout.  -continue home Zyloprim  BPH? On Flomax . - continue home flomax  DVT prophylaxis:    Anti-coagulated at home with Xarelto Code Status:   Full code  Family Communication:  Daughter in room, speaks English and understands and agrees with plan of care  Disposition Plan: Discharge home in 24-48 hours  Consults called:    None  Admission status:  Observation -  telemetry   Willette Cluster NP [facility] Pager [phone]  If 7PM-7AM, please contact night-coverage [email or web address] Password [facility]  07/27/2015, 9:39 AM

## 2015-07-28 ENCOUNTER — Observation Stay (HOSPITAL_COMMUNITY): Payer: Medicare Other

## 2015-07-28 DIAGNOSIS — R079 Chest pain, unspecified: Secondary | ICD-10-CM | POA: Diagnosis not present

## 2015-07-28 DIAGNOSIS — J189 Pneumonia, unspecified organism: Secondary | ICD-10-CM | POA: Diagnosis not present

## 2015-07-28 DIAGNOSIS — I10 Essential (primary) hypertension: Secondary | ICD-10-CM | POA: Diagnosis not present

## 2015-07-28 DIAGNOSIS — E785 Hyperlipidemia, unspecified: Secondary | ICD-10-CM

## 2015-07-28 DIAGNOSIS — N183 Chronic kidney disease, stage 3 (moderate): Secondary | ICD-10-CM | POA: Diagnosis not present

## 2015-07-28 LAB — BASIC METABOLIC PANEL
Anion gap: 9 (ref 5–15)
BUN: 19 mg/dL (ref 6–20)
CO2: 20 mmol/L — ABNORMAL LOW (ref 22–32)
CREATININE: 1.84 mg/dL — AB (ref 0.61–1.24)
Calcium: 9.1 mg/dL (ref 8.9–10.3)
Chloride: 101 mmol/L (ref 101–111)
GFR, EST AFRICAN AMERICAN: 36 mL/min — AB (ref >60)
GFR, EST NON AFRICAN AMERICAN: 31 mL/min — AB (ref >60)
Glucose, Bld: 179 mg/dL — ABNORMAL HIGH (ref 65–99)
POTASSIUM: 4.4 mmol/L (ref 3.5–5.1)
SODIUM: 130 mmol/L — AB (ref 135–145)

## 2015-07-28 LAB — CBC
HCT: 30.3 % — ABNORMAL LOW (ref 39.0–52.0)
Hemoglobin: 10.2 g/dL — ABNORMAL LOW (ref 13.0–17.0)
MCH: 29.1 pg (ref 26.0–34.0)
MCHC: 33.7 g/dL (ref 30.0–36.0)
MCV: 86.3 fL (ref 78.0–100.0)
PLATELETS: 193 10*3/uL (ref 150–400)
RBC: 3.51 MIL/uL — AB (ref 4.22–5.81)
RDW: 15.6 % — AB (ref 11.5–15.5)
WBC: 10.4 10*3/uL (ref 4.0–10.5)

## 2015-07-28 LAB — HIV ANTIBODY (ROUTINE TESTING W REFLEX): HIV SCREEN 4TH GENERATION: NONREACTIVE

## 2015-07-28 MED ORDER — IPRATROPIUM-ALBUTEROL 0.5-2.5 (3) MG/3ML IN SOLN
3.0000 mL | Freq: Four times a day (QID) | RESPIRATORY_TRACT | Status: DC | PRN
  Administered 2015-07-28: 3 mL via RESPIRATORY_TRACT
  Filled 2015-07-28 (×2): qty 3

## 2015-07-28 MED ORDER — ALUM & MAG HYDROXIDE-SIMETH 200-200-20 MG/5ML PO SUSP: 30.0000 mL | Freq: Four times a day (QID) | ORAL | Status: DC | PRN

## 2015-07-28 MED ORDER — SODIUM CHLORIDE 0.9 % IV BOLUS (SEPSIS)
500.0000 mL | Freq: Once | INTRAVENOUS | Status: AC
  Administered 2015-07-28: 500 mL via INTRAVENOUS

## 2015-07-28 MED ORDER — GADOBENATE DIMEGLUMINE 529 MG/ML IV SOLN
7.0000 mL | Freq: Once | INTRAVENOUS | Status: AC | PRN
  Administered 2015-07-28: 7 mL via INTRAVENOUS

## 2015-07-28 NOTE — Progress Notes (Signed)
BP 84/50 this am. HR 91. Expiratory Wheezing lung sounds. Paged Dr Blake Divine. Waiting to hear back from dr. Shari Heritage at bedside to translate information. Pt denies lightheadedness or dizziness. Pt is alert and oriented at this time. Will continue to monitor pt.

## 2015-07-28 NOTE — Progress Notes (Signed)
PROGRESS NOTE    [name]  EZM:[id number] DOB: 1926/08/08 DOA: 07/27/2015 PCP: [name], MD    Brief Narrative:   [name] is a 82 y.o. male , non English speaking from [place] with  medical problems not limited to CAD, HTN, paroxysmal atrial fibrillation on Xarelto , embolic CVAs (afib), PVD s/p left internal carotid artery stenting March 2015, CAD /cath 2016 revealed 80% left main with an occluded domant RCA treated medically,  chronic systolic CHF secondary to ischemic cardiomyopathy presents with left neck pain and chest pain and sob.    Assessment & Plan:   Active Problems:   Hypertension   Hyperlipidemia   Coronary artery disease   Paroxysmal atrial fibrillation (HCC)   Ischemic cardiomyopathy   Neck pain on left side   Pain in the chest   Pneumonia   CKD (chronic kidney disease), stage III   Carotid artery disease (HCC)   CAP (community acquired pneumonia)   Left sided neck pain/ chest pain: Admitted to telemetry. Cardiac enzymes are negative.  CT neck  Reveals 2.5 to 3 cm soft tissue density in the high right carotid space.  It will be followed up with MRI of the neck for further evaluation.  ACS ruled out.  Resume aspirin.  Community acquired pneumonia: resume rocephin and zithromax.    Paroxysmal atrial fibrillation: rate controlled. Resume xarelto.    Hypertension: controlled.  Resume home meds.   STAGE 3 CKD: stable.      DVT prophylaxis: (Lovenox) Code Status: (Full) Family Communication: son at bedside.  Disposition Plan: pending further management.    Consultants:   none   Procedures: MRI of the soft tissues of the neck.   CT neck.    Antimicrobials:    Rocephin and AZITHROMYCIN 6/17  Subjective: Reports chest pain has resolved.  Sob has improved.  Neck pain is present.   Objective: Filed Vitals:   07/27/15 2210 07/28/15 0558 07/28/15 0837 07/28/15 1416  BP: 140/81 123/74 84/50 151/80  Pulse: 98  92 91 90  Temp: 97.9 F (36.6 C) 98 F (36.7 C)  97.9 F (36.6 C)  TempSrc: Oral Oral  Oral  Resp: 17 16  16   Height:      Weight:  69.3 kg (152 lb 12.5 oz)    SpO2: 99% 98%  100%   No intake or output data in the 24 hours ending 07/28/15 1452 Filed Weights   07/28/15 0558  Weight: 69.3 kg (152 lb 12.5 oz)    Examination:  General exam: Appears calm and comfortable  Respiratory system: Clear to auscultation. Respiratory effort normal. Cardiovascular system: S1 & S2 heard, RRR. [name] No pedal edema. Gastrointestinal system: Abdomen is nondistended, soft and nontender. No organomegaly or masses felt. Normal bowel sounds heard. Central nervous system: Alert and oriented. No focal neurological deficits. Extremities: Symmetric 5 x 5 power. Skin: No rashes, lesions or ulcers     Data Reviewed: I have personally reviewed following labs and imaging studies  CBC:  Recent Labs Lab 07/27/15 0609 07/28/15 0450  WBC 7.4 10.4  HGB 10.7* 10.2*  HCT 32.8* 30.3*  MCV 87.7 86.3  PLT 187 193   Basic Metabolic Panel:  Recent Labs Lab 07/27/15 0609 07/28/15 0450  NA 134* 130*  K 4.7 4.4  CL 104 101  CO2 21* 20*  GLUCOSE 147* 179*  BUN 17 19  CREATININE 1.90* 1.84*  CALCIUM 9.3 9.1   GFR: Estimated Creatinine Clearance: 24.8 mL/min (by C-G formula based on  Cr of 1.84). Liver Function Tests: No results for input(s): AST, ALT, ALKPHOS, BILITOT, PROT, ALBUMIN in the last 168 hours. No results for input(s): LIPASE, AMYLASE in the last 168 hours. No results for input(s): AMMONIA in the last 168 hours. Coagulation Profile: No results for input(s): INR, PROTIME in the last 168 hours. Cardiac Enzymes:  Recent Labs Lab 07/27/15 1106 07/27/15 1356 07/27/15 1629  TROPONINI <0.03 <0.03 <0.03   BNP (last 3 results) No results for input(s): PROBNP in the last 8760 hours. HbA1C: No results for input(s): HGBA1C in the last 72 hours. CBG: No results for input(s): GLUCAP in the  last 168 hours. Lipid Profile: No results for input(s): CHOL, HDL, LDLCALC, TRIG, CHOLHDL, LDLDIRECT in the last 72 hours. Thyroid Function Tests: No results for input(s): TSH, T4TOTAL, FREET4, T3FREE, THYROIDAB in the last 72 hours. Anemia Panel: No results for input(s): VITAMINB12, FOLATE, FERRITIN, TIBC, IRON, RETICCTPCT in the last 72 hours. Sepsis Labs: No results for input(s): PROCALCITON, LATICACIDVEN in the last 168 hours.  No results found for this or any previous visit (from the past 240 hour(s)).       Radiology Studies: Dg Chest 2 View  07/27/2015  CLINICAL DATA:  Chest tightness, shortness of breath, dizziness, and cough for 5 days. EXAM: CHEST  2 VIEW COMPARISON:  07/07/2014 FINDINGS: New linear infiltration in the left lung base could represent atelectasis or pneumonia. Slight fibrosis in the right lung base is unchanged. Mild cardiac enlargement without vascular congestion. Calcified aorta. No blunting of costophrenic angles. No pneumothorax. Degenerative changes in the spine. IMPRESSION: New infiltration or atelectasis in the left lung base. Electronically Signed   By: Burman Nieves M.D.   On: 07/27/2015 06:33   Ct Soft Tissue Neck Wo Contrast  07/27/2015  CLINICAL DATA:  Chronic, intermittent left-sided neck pain worse for the past month and exacerbated by coughing and swallowing. Patient points to a localized area just left of the trachea. EXAM: CT NECK WITHOUT CONTRAST TECHNIQUE: Multidetector CT imaging of the neck was performed following the standard protocol without intravenous contrast. COMPARISON:  None. FINDINGS: Pharynx and larynx: No pharyngeal mass identified on this unenhanced study. Larynx is unremarkable. Salivary glands: The left submandibular and both parotid glands are unremarkable. There is asymmetric enlargement of the right submandibular gland with a nodular or tubular contour laterally measuring approximately 1.5 cm in width. No surrounding inflammatory  changes present, and no salivary calculi are identified. A 9 x 5 mm nonspecific soft tissue nodule extends to the skin surface anterior to the left parotid gland. Thyroid: Unremarkable. Lymph nodes: Scattered, subcentimeter lymph nodes are noted bilaterally in the neck. No enlarged lymph nodes are identified outside of the right carotid space lesion described below. Vascular: There is a homogeneous soft tissue density mass in the right carotid space measuring 1.9 x 1.4 cm on axial images adn estimated to be 2.5-3 cm in length. This is located between the right internal and external carotid arteries. Its relationship to the internal jugular vein is difficult to determine given the lack of IV contrast. Bilateral carotid bifurcation atherosclerosis with a stents noted on the left. Limited intracranial: No acute abnormality identified. Visualized orbits: Prior bilateral cataract extraction. Mastoids and visualized paranasal sinuses: Chronic appearing right mastoid effusion. Visualized paranasal sinuses are clear. Skeleton: Moderate to advanced cervical disc and facet degeneration at multiple levels. Upper chest: Partially visualized aneurysmal dilatation of the distal aortic arch measuring at least 4.2 cm in diameter with prominent atherosclerotic calcification. IMPRESSION: 1.  No soft tissue etiology of left-sided neck pain identified. 2. 2.5-3 cm soft tissue density in the high right carotid space, with assessment limited by lack of IV contrast. Considerations include focal dilation of the internal jugular vein, and enlarged lymph node, or neoplasm (such as schwannoma). Nonemergent neck MRI (possibly with half dose contrast if patient's renal function allows) may be helpful for further characterization. 3. Asymmetric enlargement of the right submandibular gland versus adjacent soft tissue mass or dilated vein. This could also be assessed on neck MRI. 4. Partially visualized thoracic aortic aneurysm. This can be further  evaluated with chest CT. Electronically Signed   By: Sebastian Ache M.D.   On: 07/27/2015 12:47   Ct Chest Wo Contrast  07/28/2015  CLINICAL DATA:  Thoracic aortic aneurysm. EXAM: CT CHEST WITHOUT CONTRAST TECHNIQUE: Multidetector CT imaging of the chest was performed following the standard protocol without IV contrast. COMPARISON:  None available. FINDINGS: Mediastinum/Lymph Nodes: The thoracic aorta is torturous with advanced calcified atherosclerotic plaque throughout its course. The ascending aorta measures 4.3 cm in transverse diameter. There is a mild fusiform dilation of the distal ascending aorta measuring up to 3.6 cm. The aortic diameter at the arch also demonstrates focal saccular dilation measuring 4.3 cm. transversely. Advanced calcified atherosclerotic disease of the coronary arteries. The heart is mildly enlarged. There is a small pericardial effusion. No masses or pathologically enlarged lymph nodes identified on this un-enhanced exam. Lungs/Pleura: No pulmonary mass, infiltrate, or effusion. Upper abdomen: Moderate in size hiatal hernia. Mild bilateral renal cortical thinning. Musculoskeletal: No chest wall mass or suspicious bone lesions identified. Multilevel osteoarthritic changes of the thoracic spine. IMPRESSION: Tortuosity and calcified atherosclerotic disease of the thoracic aorta with aneurysmal dilation of the distal ascending aorta and aortic arch, with maximal transverse measurement at the arch of 4.3 cm. Recommend semi-annual imaging followup by CTA or MRA and referral to cardiothoracic surgery if not already obtained. This recommendation follows 2010 ACCF/AHA/AATS/ACR/ASA/SCA/SCAI/SIR/STS/SVM Guidelines for the Diagnosis and Management of Patients With Thoracic Aortic Disease. Circulation. 2010; 121: e266-e36 Mild cardiomegaly with small pericardial effusion. Advanced calcified atherosclerotic disease of the coronary arteries. Moderate hiatal hernia. Bilateral renal cortical thinning.  Electronically Signed   By: Ted Mcalpine M.D.   On: 07/28/2015 12:35   Mr Neck Soft Tissue Only W Wo Contrast  07/28/2015  CLINICAL DATA:  Dyspnea with LEFT-sided neck pain. EXAM: MR NECK SOFT TISSUE ONLY WITHOUT AND WITH CONTRAST TECHNIQUE: Multiplanar, multisequence MR imaging was performed both before and after administration of intravenous contrast. CONTRAST:  7mL MULTIHANCE GADOBENATE DIMEGLUMINE 529 MG/ML IV SOLN COMPARISON:  Noncontrast CT neck 07/27/2015. FINDINGS: The study is significantly degraded by the patient's inability to remain motionless and inability to not cough. Redemonstrated is an 11 x 21 x 22 mm homogeneous enhancing lesion which appears to adjacent to or within the RIGHT carotid space. Its orientation on axial postcontrast image 23 series 15 is transverse ovoid, and its orientation on sagittal postcontrast image 18 series 17 is more vertical. No similar lesions elsewhere are observed. No inflammatory change or enhancement is seen in the LEFT neck to explain the patient's LEFT-sided neck pain with dyspnea. The airway is patent. There is cervical spondylosis. No intracranial abnormalities are detected. IMPRESSION: Severely degraded exam by patient motion. RIGHT neck lesion measuring approximately 1 x 2 x 2 cm demonstrates homogeneous enhancement, lying adjacent to or within the RIGHT carotid space. Differential considerations would include focal dilatation of the internal jugular vein, schwannoma, or isolated lymph node  enlargement.Given its apparent encapsulated appearance, this is likely a benign/incidental finding. Consider continued surveillance CT neck with contrast in 3-6 months as clinically indicated. No inflammatory or neoplastic process in the LEFT neck to correlate with the reported symptoms. Electronically Signed   By: Elsie Stain M.D.   On: 07/28/2015 13:59        Scheduled Meds: . allopurinol  300 mg Oral Daily  . aspirin EC  81 mg Oral Daily  . atorvastatin   40 mg Oral q1800  . azithromycin  500 mg Intravenous Q24H  . carvedilol  3.125 mg Oral BID WC  . cefTRIAXone (ROCEPHIN)  IV  1 g Intravenous Q24H  . isosorbide mononitrate  15 mg Oral Daily  . pantoprazole  40 mg Oral BID  . Rivaroxaban  15 mg Oral Q supper  . sodium chloride flush  3 mL Intravenous Q12H  . tamsulosin  0.4 mg Oral Daily   Continuous Infusions:       Time spent: 30 minutes.    Kathlen Mody, MD [facility] Pager [phone]  If 7PM-7AM, please contact night-coverage [email or web address] Password [email or web address] 07/28/2015, 2:52 PM

## 2015-07-29 DIAGNOSIS — J189 Pneumonia, unspecified organism: Secondary | ICD-10-CM

## 2015-07-29 DIAGNOSIS — N183 Chronic kidney disease, stage 3 (moderate): Secondary | ICD-10-CM

## 2015-07-29 DIAGNOSIS — R079 Chest pain, unspecified: Secondary | ICD-10-CM | POA: Diagnosis not present

## 2015-07-29 DIAGNOSIS — M542 Cervicalgia: Secondary | ICD-10-CM | POA: Diagnosis not present

## 2015-07-29 DIAGNOSIS — I480 Paroxysmal atrial fibrillation: Secondary | ICD-10-CM

## 2015-07-29 DIAGNOSIS — I2510 Atherosclerotic heart disease of native coronary artery without angina pectoris: Secondary | ICD-10-CM

## 2015-07-29 LAB — EXPECTORATED SPUTUM ASSESSMENT W GRAM STAIN, RFLX TO RESP C

## 2015-07-29 LAB — EXPECTORATED SPUTUM ASSESSMENT W REFEX TO RESP CULTURE

## 2015-07-29 MED ORDER — LEVOFLOXACIN 500 MG PO TABS: 500.0000 mg | ORAL_TABLET | ORAL | Status: DC

## 2015-07-29 NOTE — Progress Notes (Addendum)
NURSING PROGRESS NOTE  Darias Duque [id number] Discharge Data: 07/29/2015 1:33 PM Attending Provider: Starleen Arms, MD PFX:[id number],BDZH [name], MD     Pura Spice to be D/C'd Home per MD order.  Discussed with the family the After Visit Summary and all questions fully answered. All IV's discontinued with no bleeding noted. All belongings returned to patient for patient to take home.   Last Vital Signs:  Blood pressure 108/53, pulse 82, temperature 97.9 F (36.6 C), temperature source Oral, resp. rate 16, height 5\' 3"  (1.6 m), weight 65.681 kg (144 lb 12.8 oz), SpO2 96 %.  Discharge Medication List   Medication List    TAKE these medications        allopurinol 300 MG tablet  Commonly known as:  ZYLOPRIM  Take 300 mg by mouth daily.     aspirin EC 81 MG tablet  Take 1 tablet (81 mg total) by mouth daily.     atorvastatin 40 MG tablet  Commonly known as:  LIPITOR  Take 1 tablet (40 mg total) by mouth daily at 6 PM.     carvedilol 3.125 MG tablet  Commonly known as:  COREG  Take 1 tablet (3.125 mg total) by mouth 2 (two) times daily with a meal.     furosemide 20 MG tablet  Commonly known as:  LASIX  Take 1 tablet (20 mg total) by mouth every other day.     isosorbide mononitrate 30 MG 24 hr tablet  Commonly known as:  IMDUR  TAKE ONE-HALF TABLET BY MOUTH DAILY     levofloxacin 500 MG tablet  Commonly known as:  LEVAQUIN  Take 1 tablet (500 mg total) by mouth every other day.  Start taking on:  07/30/2015     nitroGLYCERIN 0.4 MG SL tablet  Commonly known as:  NITROSTAT  Place 1 tablet (0.4 mg total) under the tongue every 5 (five) minutes x 3 doses as needed for chest pain.     pantoprazole 40 MG tablet  Commonly known as:  PROTONIX  Take 1 tablet (40 mg total) by mouth 2 (two) times daily.     Rivaroxaban 15 MG Tabs tablet  Commonly known as:  XARELTO  Take 15 mg by mouth daily.     tamsulosin 0.4 MG Caps capsule  Commonly known as:  FLOMAX   Take 0.4 mg by mouth daily.         Bennie Pierini, RN

## 2015-07-29 NOTE — Discharge Summary (Signed)
[name], is a 82 y.o. male  DOB [birth year]  MRN [id number].  Admission date:  07/27/2015  Admitting Physician  Clydie Braun, MD  Discharge Date:  07/29/2015   Primary MD  [name], MD  Recommendations for primary care physician for things to follow:  - Patient will need repeat CT soft tissue neck for a follow-up on incidental finding of right neck lesion. - Please check CBC, BMP during next visit   Admission Diagnosis  Neck pain [M54.2] Chest pain, unspecified chest pain type [R07.9]   Discharge Diagnosis  Neck pain [M54.2] Chest pain, unspecified chest pain type [R07.9]   Active Problems:   Hypertension   Hyperlipidemia   Coronary artery disease   Paroxysmal atrial fibrillation (HCC)   Ischemic cardiomyopathy   Neck pain on left side   Pain in the chest   Pneumonia   CKD (chronic kidney disease), stage III   Carotid artery disease (HCC)   CAP (community acquired pneumonia)      Past Medical History  Diagnosis Date  . Hypertension   . Gout   . Hard of hearing   . Anemia   . AAA (abdominal aortic aneurysm) (HCC)   . Depression   . Hyperplasia, prostate   . Asthma   . H. pylori infection   . PUD (peptic ulcer disease)   . Hiatal hernia   . Renal failure, acute (HCC) 11/08/2012  . Carotid artery disease (HCC)   . Coronary artery disease   . CHF (congestive heart failure) [facility])     Past Surgical History  Procedure Laterality Date  . Esophagogastroduodenoscopy  06/13/2008,12/31/12  . Carotid stent insertion  2015  . Left heart catheterization with coronary angiogram N/A 05/04/2013    Procedure: LEFT HEART CATHETERIZATION WITH CORONARY ANGIOGRAM;  Surgeon: Peter M [place], MD;  Location: [facility] CATH LAB;  Service: Cardiovascular;  Laterality: N/A;  . Carotid stent insertion N/A 05/09/2013    Procedure: CAROTID STENT INSERTION;  Surgeon: Runell Gess, MD;  Location:  [facility] CATH LAB;  Service: Cardiovascular;  Laterality: N/A;       History of present illness and  Hospital Course:     Kindly see H&P for history of present illness and admission details, please review complete Labs, Consult reports and Test reports for all details in brief  HPI  from the history and physical done on the day of admission 07/27/2015 HPI: [name] is a 82 y.o. male , non [place] speaking from [place]. His daughter is here and provides history. Patient has multiple medical problems not limited to CAD, HTN, paroxysmal atrial fibrillation on Xarelto , embolic CVAs (afib), PVD s/p left internal carotid artery stenting March 2015, CAD /cath 2016 revealed 80% left main with an occluded domant RCA treated medically. He has chronic systolic CHF secondary to ischemic cardiomyopathy.   Patient has chronic intermittent centralized chest pain. Pain has been the same for years and sometimes alleviated with acid blockers. Daughter thinks the pain was better controlled when patient was on  Nexium. At some point he was switched to Protonix and has also been taking Maalox without resolution of the pain. Patient comes to the emergency department now for evaluation of the chest discomfort as well as dyspnea and left-sided neck pain. The left-sided neck pain is also chronic intermittent though over the last month it's been present almost daily. The neck pain is exacerbated with swallowing and coughing. Per daughter patient has been coughing, mainly at night. The cough is nonproductive. Patient less mobile recently secondary to dyspnea. Overall, I think patient is here in ED because he / family concerned because the left neck pain is persistent and patient seems more short of breath recently. His chest pain has not really changed in any way.    Hospital Course    Nonexertional chest pain  - chronic / intermittent, non-typical , appears to be most likely musculoskeletal , no significant events  on telemetry, cardiac enzymes negative 3  Incidental finding of right neck lesion on CT neck,  - MRI showing right neck lesion measuring approximately 1 x 2 x 2 cm with homogeneous enhancement, Differential considerations would include focal dilatation of the internal jugular vein, schwannoma, or isolated lymph node enlargement.Given its apparent encapsulated appearance, this is likely a benign/incidental finding. Consider continued surveillance CT neck with contrast in 3-6 months as clinically indicated. - Discussed with family, recommendation to repeat CT neck in 6 months.  CAP.  - treated with IV Rocephin and azithromycin during hospital stay, we'll discharge on levofloxacin   Neck pain on left side, - chronic / intermittent, exacerbated by cough and swallowing. Musculoskeletal .  Paroxysmal atrial fibrillation (HCC). -  Rate controlled. Chadsvasc 7. On chronic anticoagulant - continue Xarelto  Hypertension,  -Continue carvedilol, isosorbide, lasix  Ischemic cardiomyopathy / chronic combined systolic and diastolic heart failure. -  Last Echo May 2016 with EF 40% and grade 3 diastolic dysfunction. No evidence for volume overload. -continue home coreg    CKD (chronic kidney disease), stage III. Cr 1.9, about same as in late May -avoid nephrotoxic medications    Carotid artery disease (HCC). - Status post left internal carotid artery stenting May 2015   Hyperlipidemia.  -continue home statin  Gout.  -continue home Zyloprim  BPH - continue home flomax  Discharge Condition: stable   Follow UP  Follow-up Information    Follow up with [name], MD. Schedule an appointment as soon as possible for a visit in 1 week.   Specialty:  Family Medicine   Contact information:   [address] [place] [address] [phone]         Discharge Instructions  and  Discharge Medications    Discharge Instructions    Diet - low sodium heart healthy     Complete by:  As directed      Discharge instructions    Complete by:  As directed   Follow with Primary MD [name], MD in 7 days   Get CBC, CMP, checked  by Primary MD next visit.    Activity: As tolerated with Full fall precautions use walker/cane & assistance as needed   Disposition Home    Diet: Heart Healthy , with feeding assistance and aspiration precautions.  For Heart failure patients - Check your Weight same time everyday, if you gain over 2 pounds, or you develop in leg swelling, experience more shortness of breath or chest pain, call your Primary MD immediately. Follow Cardiac Low Salt Diet and 1.5 lit/day fluid restriction.  On your next visit with your primary care physician please Get Medicines reviewed and adjusted.   Please request your Prim.MD to go over all Hospital Tests and Procedure/Radiological results at the follow up, please get all Hospital records sent to your Prim MD by signing hospital release before you go home.   If you experience worsening of your admission symptoms, develop shortness of breath, life threatening emergency, suicidal or homicidal thoughts you must seek medical attention immediately by calling 911 or calling your MD immediately  if symptoms less severe.  You Must read complete instructions/literature along with all the possible adverse reactions/side effects for all the Medicines you take and that have been prescribed to you. Take any new Medicines after you have completely understood and accpet all the possible adverse reactions/side effects.   Do not drive, operating heavy machinery, perform activities at heights, swimming or participation in water activities or provide baby sitting services if your were admitted for syncope or siezures until you have seen by Primary MD or a Neurologist and advised to do so again.  Do not drive when taking Pain medications.    Do not take more than prescribed Pain, Sleep and Anxiety  Medications  Special Instructions: If you have smoked or chewed Tobacco  in the last 2 yrs please stop smoking, stop any regular Alcohol  and or any Recreational drug use.  Wear Seat belts while driving.   Please note  You were cared for by a hospitalist during your hospital stay. If you have any questions about your discharge medications or the care you received while you were in the hospital after you are discharged, you can call the unit and asked to speak with the hospitalist on call if the hospitalist that took care of you is not available. Once you are discharged, your primary care physician will handle any further medical issues. Please note that NO REFILLS for any discharge medications will be authorized once you are discharged, as it is imperative that you return to your primary care physician (or establish a relationship with a primary care physician if you do not have one) for your aftercare needs so that they can reassess your need for medications and monitor your lab values.     Increase activity slowly    Complete by:  As directed             Medication List    STOP taking these medications        furosemide 20 MG tablet  Commonly known as:  LASIX      TAKE these medications        allopurinol 300 MG tablet  Commonly known as:  ZYLOPRIM  Take 300 mg by mouth daily.     aspirin EC 81 MG tablet  Take 1 tablet (81 mg total) by mouth daily.     atorvastatin 40 MG tablet  Commonly known as:  LIPITOR  Take 1 tablet (40 mg total) by mouth daily at 6 PM.     carvedilol 3.125 MG tablet  Commonly known as:  COREG  Take 1 tablet (3.125 mg total) by mouth 2 (two) times daily with a meal.     isosorbide mononitrate 30 MG 24 hr tablet  Commonly known as:  IMDUR  TAKE ONE-HALF TABLET BY MOUTH DAILY     levofloxacin 500 MG tablet  Commonly known as:  LEVAQUIN  Take 1 tablet (500 mg total) by mouth every other day.  Start taking on:  07/30/2015  nitroGLYCERIN 0.4 MG  SL tablet  Commonly known as:  NITROSTAT  Place 1 tablet (0.4 mg total) under the tongue every 5 (five) minutes x 3 doses as needed for chest pain.     pantoprazole 40 MG tablet  Commonly known as:  PROTONIX  Take 1 tablet (40 mg total) by mouth 2 (two) times daily.     Rivaroxaban 15 MG Tabs tablet  Commonly known as:  XARELTO  Take 15 mg by mouth daily.     tamsulosin 0.4 MG Caps capsule  Commonly known as:  FLOMAX  Take 0.4 mg by mouth daily.          Diet and Activity recommendation: See Discharge Instructions above   Consults obtained -  none   Major procedures and Radiology Reports - PLEASE review detailed and final reports for all details, in brief -      Dg Chest 2 View  07/27/2015  CLINICAL DATA:  Chest tightness, shortness of breath, dizziness, and cough for 5 days. EXAM: CHEST  2 VIEW COMPARISON:  07/07/2014 FINDINGS: New linear infiltration in the left lung base could represent atelectasis or pneumonia. Slight fibrosis in the right lung base is unchanged. Mild cardiac enlargement without vascular congestion. Calcified aorta. No blunting of costophrenic angles. No pneumothorax. Degenerative changes in the spine. IMPRESSION: New infiltration or atelectasis in the left lung base. Electronically Signed   By: Burman Nieves M.D.   On: 07/27/2015 06:33   Ct Soft Tissue Neck Wo Contrast  07/27/2015  CLINICAL DATA:  Chronic, intermittent left-sided neck pain worse for the past month and exacerbated by coughing and swallowing. Patient points to a localized area just left of the trachea. EXAM: CT NECK WITHOUT CONTRAST TECHNIQUE: Multidetector CT imaging of the neck was performed following the standard protocol without intravenous contrast. COMPARISON:  None. FINDINGS: Pharynx and larynx: No pharyngeal mass identified on this unenhanced study. Larynx is unremarkable. Salivary glands: The left submandibular and both parotid glands are unremarkable. There is asymmetric  enlargement of the right submandibular gland with a nodular or tubular contour laterally measuring approximately 1.5 cm in width. No surrounding inflammatory changes present, and no salivary calculi are identified. A 9 x 5 mm nonspecific soft tissue nodule extends to the skin surface anterior to the left parotid gland. Thyroid: Unremarkable. Lymph nodes: Scattered, subcentimeter lymph nodes are noted bilaterally in the neck. No enlarged lymph nodes are identified outside of the right carotid Robinson lesion described below. Vascular: There is a homogeneous soft tissue density mass in the right carotid Robinson measuring 1.9 x 1.4 cm on axial images adn estimated to be 2.5-3 cm in length. This is located between the right internal and external carotid arteries. Its relationship to the internal jugular vein is difficult to determine given the lack of IV contrast. Bilateral carotid bifurcation atherosclerosis with a stents noted on the left. Limited intracranial: No acute abnormality identified. Visualized orbits: Prior bilateral cataract extraction. Mastoids and visualized paranasal sinuses: Chronic appearing right mastoid effusion. Visualized paranasal sinuses are clear. Skeleton: Moderate to advanced cervical disc and facet degeneration at multiple levels. Upper chest: Partially visualized aneurysmal dilatation of the distal aortic arch measuring at least 4.2 cm in diameter with prominent atherosclerotic calcification. IMPRESSION: 1. No soft tissue etiology of left-sided neck pain identified. 2. 2.5-3 cm soft tissue density in the high right carotid Robinson, with assessment limited by lack of IV contrast. Considerations include focal dilation of the internal jugular vein, and enlarged lymph node, or neoplasm (  such as schwannoma). Nonemergent neck MRI (possibly with half dose contrast if patient's renal function allows) may be helpful for further characterization. 3. Asymmetric enlargement of the right submandibular gland  versus adjacent soft tissue mass or dilated vein. This could also be assessed on neck MRI. 4. Partially visualized thoracic aortic aneurysm. This can be further evaluated with chest CT. Electronically Signed   By: Sebastian Ache M.D.   On: 07/27/2015 12:47   Ct Chest Wo Contrast  07/28/2015  CLINICAL DATA:  Thoracic aortic aneurysm. EXAM: CT CHEST WITHOUT CONTRAST TECHNIQUE: Multidetector CT imaging of the chest was performed following the standard protocol without IV contrast. COMPARISON:  None available. FINDINGS: Mediastinum/Lymph Nodes: The thoracic aorta is torturous with advanced calcified atherosclerotic plaque throughout its course. The ascending aorta measures 4.3 cm in transverse diameter. There is a mild fusiform dilation of the distal ascending aorta measuring up to 3.6 cm. The aortic diameter at the arch also demonstrates focal saccular dilation measuring 4.3 cm. transversely. Advanced calcified atherosclerotic disease of the coronary arteries. The heart is mildly enlarged. There is a small pericardial effusion. No masses or pathologically enlarged lymph nodes identified on this un-enhanced exam. Lungs/Pleura: No pulmonary mass, infiltrate, or effusion. Upper abdomen: Moderate in size hiatal hernia. Mild bilateral renal cortical thinning. Musculoskeletal: No chest wall mass or suspicious bone lesions identified. Multilevel osteoarthritic changes of the thoracic spine. IMPRESSION: Tortuosity and calcified atherosclerotic disease of the thoracic aorta with aneurysmal dilation of the distal ascending aorta and aortic arch, with maximal transverse measurement at the arch of 4.3 cm. Recommend semi-annual imaging followup by CTA or MRA and referral to cardiothoracic surgery if not already obtained. This recommendation follows 2010 ACCF/AHA/AATS/ACR/ASA/SCA/SCAI/SIR/STS/SVM Guidelines for the Diagnosis and Management of Patients With Thoracic Aortic Disease. Circulation. 2010; 121: e266-e36 Mild cardiomegaly  with small pericardial effusion. Advanced calcified atherosclerotic disease of the coronary arteries. Moderate hiatal hernia. Bilateral renal cortical thinning. Electronically Signed   By: Ted Mcalpine M.D.   On: 07/28/2015 12:35   Mr Neck Soft Tissue Only W Wo Contrast  07/28/2015  CLINICAL DATA:  Dyspnea with LEFT-sided neck pain. EXAM: MR NECK SOFT TISSUE ONLY WITHOUT AND WITH CONTRAST TECHNIQUE: Multiplanar, multisequence MR imaging was performed both before and after administration of intravenous contrast. CONTRAST:  7mL MULTIHANCE GADOBENATE DIMEGLUMINE 529 MG/ML IV SOLN COMPARISON:  Noncontrast CT neck 07/27/2015. FINDINGS: The study is significantly degraded by the patient's inability to remain motionless and inability to not cough. Redemonstrated is an 11 x 21 x 22 mm homogeneous enhancing lesion which appears to adjacent to or within the RIGHT carotid Robinson. Its orientation on axial postcontrast image 23 series 15 is transverse ovoid, and its orientation on sagittal postcontrast image 18 series 17 is more vertical. No similar lesions elsewhere are observed. No inflammatory change or enhancement is seen in the LEFT neck to explain the patient's LEFT-sided neck pain with dyspnea. The airway is patent. There is cervical spondylosis. No intracranial abnormalities are detected. IMPRESSION: Severely degraded exam by patient motion. RIGHT neck lesion measuring approximately 1 x 2 x 2 cm demonstrates homogeneous enhancement, lying adjacent to or within the RIGHT carotid Robinson. Differential considerations would include focal dilatation of the internal jugular vein, schwannoma, or isolated lymph node enlargement.Given its apparent encapsulated appearance, this is likely a benign/incidental finding. Consider continued surveillance CT neck with contrast in 3-6 months as clinically indicated. No inflammatory or neoplastic process in the LEFT neck to correlate with the reported symptoms. Electronically Signed  By: Elsie Stain M.D.   On: 07/28/2015 13:59    Micro Results     Recent Results (from the past 240 hour(s))  Culture, blood (routine x 2) Call MD if unable to obtain prior to antibiotics being given     Status: None (Preliminary result)   Collection Time: 07/27/15 11:06 AM  Result Value Ref Range Status   Specimen Description BLOOD RIGHT ANTECUBITAL  Final   Special Requests BOTTLES DRAWN AEROBIC AND ANAEROBIC 10CC  Final   Culture NO GROWTH 1 DAY  Final   Report Status PENDING  Incomplete  Culture, blood (routine x 2) Call MD if unable to obtain prior to antibiotics being given     Status: None (Preliminary result)   Collection Time: 07/27/15 11:38 AM  Result Value Ref Range Status   Specimen Description BLOOD RIGHT HAND  Final   Special Requests BOTTLES DRAWN AEROBIC AND ANAEROBIC 4CC  Final   Culture NO GROWTH 1 DAY  Final   Report Status PENDING  Incomplete  Culture, sputum-assessment     Status: None   Collection Time: 07/28/15 10:41 PM  Result Value Ref Range Status   Specimen Description EXPECTORATED SPUTUM  Final   Special Requests NONE  Final   Sputum evaluation   Final    MICROSCOPIC FINDINGS SUGGEST THAT THIS SPECIMEN IS NOT REPRESENTATIVE OF LOWER RESPIRATORY SECRETIONS. PLEASE RECOLLECT. Gram Stain Report Called to,Read Back By and Verified With: KAREN SHOMO,RN @[id number]  07/29/15 MKELLY    Report Status 07/29/2015 FINAL  Final       Today   Subjective:   Daryel Burbano today has no headache,no chest or abdominal pain, feels much better  today. Son at bedside and translates for [place].  Objective:   Blood pressure 108/53, pulse 82, temperature 97.9 F (36.6 C), temperature source Oral, resp. rate 16, height 5\' 3"  (1.6 m), weight 65.681 kg (144 lb 12.8 oz), SpO2 96 %.   Intake/Output Summary (Last 24 hours) at 07/29/15 1222 Last data filed at 07/28/15 2317  Gross per 24 hour  Intake      0 ml  Output    625 ml  Net   -625 ml    Exam Awake Alert,    Supple Neck,No JVD,  Symmetrical Chest wall movement, Good air movement bilaterally, CTAB RRR,No Gallops,Rubs or new Murmurs, +ve B.Sounds, Abd Soft, Non tender,  No rebound -guarding or rigidity. No Cyanosis, Clubbing or edema, No new Rash or bruise  Data Review   CBC w Diff: Lab Results  Component Value Date   WBC 10.4 07/28/2015   WBC 7.6 09/09/2013   HGB 10.2* 07/28/2015   HCT 30.3* 07/28/2015   PLT 193 07/28/2015   LYMPHOPCT 19 07/07/2014   MONOPCT 8 07/07/2014   EOSPCT 4 07/07/2014   BASOPCT 0 07/07/2014    CMP: Lab Results  Component Value Date   NA 130* 07/28/2015   NA 134 09/09/2013   K 4.4 07/28/2015   CL 101 07/28/2015   CO2 20* 07/28/2015   BUN 19 07/28/2015   BUN 21 09/09/2013   CREATININE 1.84* 07/28/2015   PROT 7.3 11/16/2013   ALBUMIN 3.6 11/16/2013   BILITOT 0.8 11/16/2013   ALKPHOS 77 11/16/2013   AST 15 11/16/2013   ALT 6 11/16/2013  .   Total Time in preparing paper work, data evaluation and todays exam - 35 minutes  Fathima Bartl M.D on 07/29/2015 at 12:22 PM  [facility]   Office  [phone]

## 2015-07-29 NOTE — Discharge Instructions (Signed)
Follow with Primary MD [name], MD in 7 days   Get CBC, CMP, checked  by Primary MD next visit.    Activity: As tolerated with Full fall precautions use walker/cane & assistance as needed   Disposition Home    Diet: Heart Healthy , with feeding assistance and aspiration precautions.  For Heart failure patients - Check your Weight same time everyday, if you gain over 2 pounds, or you develop in leg swelling, experience more shortness of breath or chest pain, call your Primary MD immediately. Follow Cardiac Low Salt Diet and 1.5 lit/day fluid restriction.   On your next visit with your primary care physician please Get Medicines reviewed and adjusted.   Please request your Prim.MD to go over all Hospital Tests and Procedure/Radiological results at the follow up, please get all Hospital records sent to your Prim MD by signing hospital release before you go home.   If you experience worsening of your admission symptoms, develop shortness of breath, life threatening emergency, suicidal or homicidal thoughts you must seek medical attention immediately by calling 911 or calling your MD immediately  if symptoms less severe.  You Must read complete instructions/literature along with all the possible adverse reactions/side effects for all the Medicines you take and that have been prescribed to you. Take any new Medicines after you have completely understood and accpet all the possible adverse reactions/side effects.   Do not drive, operating heavy machinery, perform activities at heights, swimming or participation in water activities or provide baby sitting services if your were admitted for syncope or siezures until you have seen by Primary MD or a Neurologist and advised to do so again.  Do not drive when taking Pain medications.    Do not take more than prescribed Pain, Sleep and Anxiety Medications  Special Instructions: If you have smoked or chewed Tobacco  in the last 2 yrs  please stop smoking, stop any regular Alcohol  and or any Recreational drug use.  Wear Seat belts while driving.   Please note  You were cared for by a hospitalist during your hospital stay. If you have any questions about your discharge medications or the care you received while you were in the hospital after you are discharged, you can call the unit and asked to speak with the hospitalist on call if the hospitalist that took care of you is not available. Once you are discharged, your primary care physician will handle any further medical issues. Please note that NO REFILLS for any discharge medications will be authorized once you are discharged, as it is imperative that you return to your primary care physician (or establish a relationship with a primary care physician if you do not have one) for your aftercare needs so that they can reassess your need for medications and monitor your lab values.  Information on my medicine - XARELTO (Rivaroxaban)  This medication education was reviewed with me or my healthcare representative as part of my discharge preparation.   Why was Xarelto prescribed for you? Xarelto was prescribed for you to reduce the risk of a blood clot forming that can cause a stroke if you have a medical condition called atrial fibrillation (a type of irregular heartbeat).  What do you need to know about xarelto ? Take your Xarelto ONCE DAILY at the same time every day with your evening meal. If you have difficulty swallowing the tablet whole, you may crush it and mix in applesauce just prior to taking your dose.  Take Xarelto exactly as prescribed by your doctor and DO NOT stop taking Xarelto without talking to the doctor who prescribed the medication.  Stopping without other stroke prevention medication to take the place of Xarelto may increase your risk of developing a clot that causes a stroke.  Refill your prescription before you run out.  After discharge, you should  have regular check-up appointments with your healthcare provider that is prescribing your Xarelto.  In the future your dose may need to be changed if your kidney function or weight changes by a significant amount.  What do you do if you miss a dose? If you are taking Xarelto ONCE DAILY and you miss a dose, take it as soon as you remember on the same day then continue your regularly scheduled once daily regimen the next day. Do not take two doses of Xarelto at the same time or on the same day.   Important Safety Information A possible side effect of Xarelto is bleeding. You should call your healthcare provider right away if you experience any of the following: ? Bleeding from an injury or your nose that does not stop. ? Unusual colored urine (red or dark brown) or unusual colored stools (red or black). ? Unusual bruising for unknown reasons. ? A serious fall or if you hit your head (even if there is no bleeding).  Some medicines may interact with Xarelto and might increase your risk of bleeding while on Xarelto. To help avoid this, consult your healthcare provider or pharmacist prior to using any new prescription or non-prescription medications, including herbals, vitamins, non-steroidal anti-inflammatory drugs (NSAIDs) and supplements.  This website has more information on Xarelto: Visit[employer]ination.com.br.

## 2015-07-31 ENCOUNTER — Emergency Department (HOSPITAL_COMMUNITY)
Admission: EM | Admit: 2015-07-31 | Discharge: 2015-07-31 | Disposition: A | Discharge status: Home / Self Care | Payer: Medicare Other | Attending: Emergency Medicine | Admitting: Emergency Medicine

## 2015-07-31 ENCOUNTER — Emergency Department (HOSPITAL_COMMUNITY): Payer: Medicare Other

## 2015-07-31 ENCOUNTER — Other Ambulatory Visit: Payer: Self-pay

## 2015-07-31 ENCOUNTER — Encounter (HOSPITAL_COMMUNITY): Payer: Self-pay | Admitting: Emergency Medicine

## 2015-07-31 DIAGNOSIS — Z7901 Long term (current) use of anticoagulants: Secondary | ICD-10-CM | POA: Insufficient documentation

## 2015-07-31 DIAGNOSIS — Z87891 Personal history of nicotine dependence: Secondary | ICD-10-CM | POA: Diagnosis not present

## 2015-07-31 DIAGNOSIS — Z79899 Other long term (current) drug therapy: Secondary | ICD-10-CM | POA: Insufficient documentation

## 2015-07-31 DIAGNOSIS — Z7982 Long term (current) use of aspirin: Secondary | ICD-10-CM | POA: Diagnosis not present

## 2015-07-31 DIAGNOSIS — R0789 Other chest pain: Secondary | ICD-10-CM | POA: Diagnosis present

## 2015-07-31 DIAGNOSIS — J45909 Unspecified asthma, uncomplicated: Secondary | ICD-10-CM | POA: Diagnosis not present

## 2015-07-31 DIAGNOSIS — R079 Chest pain, unspecified: Secondary | ICD-10-CM | POA: Diagnosis not present

## 2015-07-31 DIAGNOSIS — I509 Heart failure, unspecified: Secondary | ICD-10-CM | POA: Diagnosis not present

## 2015-07-31 DIAGNOSIS — R1030 Lower abdominal pain, unspecified: Secondary | ICD-10-CM | POA: Insufficient documentation

## 2015-07-31 DIAGNOSIS — N289 Disorder of kidney and ureter, unspecified: Secondary | ICD-10-CM | POA: Diagnosis not present

## 2015-07-31 DIAGNOSIS — I110 Hypertensive heart disease with heart failure: Secondary | ICD-10-CM | POA: Diagnosis not present

## 2015-07-31 DIAGNOSIS — I25119 Atherosclerotic heart disease of native coronary artery with unspecified angina pectoris: Secondary | ICD-10-CM

## 2015-07-31 LAB — COMPREHENSIVE METABOLIC PANEL
ALT: 13 U/L — ABNORMAL LOW (ref 17–63)
AST: 19 U/L (ref 15–41)
Albumin: 3.5 g/dL (ref 3.5–5.0)
Alkaline Phosphatase: 75 U/L (ref 38–126)
Anion gap: 6 (ref 5–15)
BUN: 15 mg/dL (ref 6–20)
CHLORIDE: 103 mmol/L (ref 101–111)
CO2: 26 mmol/L (ref 22–32)
Calcium: 9.3 mg/dL (ref 8.9–10.3)
Creatinine, Ser: 1.91 mg/dL — ABNORMAL HIGH (ref 0.61–1.24)
GFR, EST AFRICAN AMERICAN: 35 mL/min — AB (ref >60)
GFR, EST NON AFRICAN AMERICAN: 30 mL/min — AB (ref >60)
Glucose, Bld: 108 mg/dL — ABNORMAL HIGH (ref 65–99)
POTASSIUM: 4.2 mmol/L (ref 3.5–5.1)
SODIUM: 135 mmol/L (ref 135–145)
Total Bilirubin: 0.9 mg/dL (ref 0.3–1.2)
Total Protein: 7.1 g/dL (ref 6.5–8.1)

## 2015-07-31 LAB — BASIC METABOLIC PANEL
ANION GAP: 6 (ref 5–15)
BUN: 16 mg/dL (ref 6–20)
CALCIUM: 9.2 mg/dL (ref 8.9–10.3)
CO2: 24 mmol/L (ref 22–32)
Chloride: 102 mmol/L (ref 101–111)
Creatinine, Ser: 1.88 mg/dL — ABNORMAL HIGH (ref 0.61–1.24)
GFR, EST AFRICAN AMERICAN: 35 mL/min — AB (ref >60)
GFR, EST NON AFRICAN AMERICAN: 31 mL/min — AB (ref >60)
GLUCOSE: 114 mg/dL — AB (ref 65–99)
Potassium: 4.3 mmol/L (ref 3.5–5.1)
SODIUM: 132 mmol/L — AB (ref 135–145)

## 2015-07-31 LAB — CBC
HCT: 33.4 % — ABNORMAL LOW (ref 39.0–52.0)
HEMOGLOBIN: 11.2 g/dL — AB (ref 13.0–17.0)
MCH: 29.5 pg (ref 26.0–34.0)
MCHC: 33.5 g/dL (ref 30.0–36.0)
MCV: 87.9 fL (ref 78.0–100.0)
Platelets: 250 10*3/uL (ref 150–400)
RBC: 3.8 MIL/uL — ABNORMAL LOW (ref 4.22–5.81)
RDW: 15.8 % — ABNORMAL HIGH (ref 11.5–15.5)
WBC: 6.8 10*3/uL (ref 4.0–10.5)

## 2015-07-31 LAB — URINALYSIS, ROUTINE W REFLEX MICROSCOPIC
Bilirubin Urine: NEGATIVE
Glucose, UA: NEGATIVE mg/dL
Hgb urine dipstick: NEGATIVE
Ketones, ur: NEGATIVE mg/dL
LEUKOCYTES UA: NEGATIVE
Nitrite: NEGATIVE
PROTEIN: NEGATIVE mg/dL
Specific Gravity, Urine: 1.007 (ref 1.005–1.030)
pH: 7 (ref 5.0–8.0)

## 2015-07-31 LAB — I-STAT TROPONIN, ED
TROPONIN I, POC: 0 ng/mL (ref 0.00–0.08)
TROPONIN I, POC: 0 ng/mL (ref 0.00–0.08)

## 2015-07-31 LAB — POC OCCULT BLOOD, ED: FECAL OCCULT BLD: NEGATIVE

## 2015-07-31 LAB — LIPASE, BLOOD: LIPASE: 30 U/L (ref 11–51)

## 2015-07-31 LAB — BRAIN NATRIURETIC PEPTIDE: B NATRIURETIC PEPTIDE 5: 39 pg/mL (ref 0.0–100.0)

## 2015-07-31 MED ORDER — SODIUM CHLORIDE 0.9 % IV BOLUS (SEPSIS)
500.0000 mL | Freq: Once | INTRAVENOUS | Status: AC
  Administered 2015-07-31: 500 mL via INTRAVENOUS

## 2015-07-31 MED ORDER — IOPAMIDOL (ISOVUE-300) INJECTION 61%
INTRAVENOUS | Status: AC
  Administered 2015-07-31: 75 mL
  Filled 2015-07-31: qty 75

## 2015-07-31 MED ORDER — ESOMEPRAZOLE MAGNESIUM 40 MG PO CPDR: 40.0000 mg | DELAYED_RELEASE_CAPSULE | Freq: Every day | ORAL | Status: DC

## 2015-07-31 MED ORDER — GI COCKTAIL ~~LOC~~
30.0000 mL | Freq: Once | ORAL | Status: AC
  Administered 2015-07-31: 30 mL via ORAL
  Filled 2015-07-31: qty 30

## 2015-07-31 MED ORDER — ISOSORBIDE MONONITRATE ER 30 MG PO TB24
ORAL_TABLET | ORAL | Status: DC
Start: 2015-07-31 — End: 2016-12-03

## 2015-07-31 NOTE — ED Notes (Signed)
Cardiology at bedside. Speaking with family.

## 2015-07-31 NOTE — ED Notes (Signed)
MD at bedside.[name]

## 2015-07-31 NOTE — ED Notes (Signed)
Per family, pt states he was just discharged from the hospital for pneumonia, pt states he now feels worse, CP, dizzy, cough, headache, stomach burning. Pt in NAD.

## 2015-07-31 NOTE — Consult Note (Signed)
Cardiology Consult    Patient ID: Haseeb Fiallos MRN: [id number], DOB/AGE: 1927-01-01   Admit date: 07/31/2015 Date of Consult: 07/31/2015  Primary Physician: [name], MD Reason for Consult: Chest Pain Primary Cardiologist: Dr. Allyson Sabal Requesting Provider: Dr. Rosalia Hammers   History of Present Illness    Daemyn Gariepy is a 82 y.o. male with past medical history of CAD (cath 04/2013 showing 80% left main stenosis and CTO of RCA - medical management recommended), PAF (on Xarelto), ischemic cardiomyopathy (EF 40% by echo in 06/2014), chronic combined systolic and diastolic CHF, carotid artery stenosis, HTN, and HLD who presents to [facility] ED on 07/31/2015 for evaluation of chest pain.   History is obtained from patient's daughter, as he is [facility] and the telephone interpreter is unable to be used.   She reports his pain has been present for over 8 days. It started in the periumbical region but is now present across his sternum and pectoral regions. The pain has been constant and has never fully gone away. He has experienced chest pain for 10+ years. The pain in his abdomen along with his chest is worse with food consumption. No radiating pain down his extremities. Reports being nauseated but denies any vomiting. She reports he has been experiencing dyspnea for 15+ years with no acute changes in his symptoms. He also reports having dizziness and a headache.  He was recently admitted from 07/27/2015 - 07/29/2015 for chest pain. It was thought this was his chronic pain and possibly MSK in etiology. Troponin values were negative and EKG tracings showed no acute changes.  Since being discharged, his chest discomfort and abdominal pain has continued. While in the ED, his initial two troponin values have negative. CBC shows WBC of 6.8, Hgb 11.2, platelets 250. BMET shows Na+ of 132 and creatinine 1.88 (at baseline). EKG shows 1st degree AV block, HR 91, PVC's, and nonspecific ST changes in the  inferior leads.  Past Medical History   Past Medical History  Diagnosis Date  . Hypertension   . Gout   . Hard of hearing   . Anemia   . AAA (abdominal aortic aneurysm) (HCC)   . Depression   . Hyperplasia, prostate   . Asthma   . H. pylori infection   . PUD (peptic ulcer disease)   . Hiatal hernia   . Renal failure, acute (HCC) 11/08/2012  . Carotid artery disease (HCC)   . Coronary artery disease   . CHF (congestive heart failure) [facility])     Past Surgical History  Procedure Laterality Date  . Esophagogastroduodenoscopy  06/13/2008,12/31/12  . Carotid stent insertion  2015  . Left heart catheterization with coronary angiogram N/A 05/04/2013    Procedure: LEFT HEART CATHETERIZATION WITH CORONARY ANGIOGRAM;  Surgeon: Peter M Swaziland, MD;  Location: [facility] CATH LAB;  Service: Cardiovascular;  Laterality: N/A;  . Carotid stent insertion N/A 05/09/2013    Procedure: CAROTID STENT INSERTION;  Surgeon: Runell Gess, MD;  Location: [facility] CATH LAB;  Service: Cardiovascular;  Laterality: N/A;     Allergies  Allergies  Allergen Reactions  . Levofloxacin Anaphylaxis and Other (See Comments)    Patient told his daughter that it made him "feel worse than he already did" (overall) Headache also (has refused to take anymore)  . Eliquis [Apixaban] Itching and Swelling  . Phenazopyridine Hcl Other (See Comments)    Unknown reaction  . Septra [Sulfamethoxazole-Trimethoprim] Nausea And Vomiting    GI Upset    Inpatient Medications  Family History    Family History  Problem Relation Age of Onset  . Colon cancer Neg Hx     Social History    Social History   Social History  . Marital Status: Married    Spouse Name: san  . Number of Children: 110  . Years of Education: 4th grade   Occupational History  . retired    Social History Main Topics  . Smoking status: Former Smoker    Quit date: 02/17/2002  . Smokeless tobacco: Never Used     Comment: Quit seven years ago   .  Alcohol Use: No  . Drug Use: No  . Sexual Activity: No   Other Topics Concern  . Not on file   Social History Narrative   Patient lives at home with his daughter   Patient is right handed    Patient drinks tea and coffee     Review of Systems    General:  No chills, fever, night sweats or weight changes. Positive for dizziness and headaches. Cardiovascular:  No dyspnea on exertion, edema, orthopnea, palpitations, paroxysmal nocturnal dyspnea. Positive for chest pain. Dermatological: No rash, lesions/masses Respiratory: No cough, dyspnea Urologic: No hematuria, dysuria Abdominal:   No vomiting, diarrhea, bright red blood per rectum, melena, or hematemesis. Positive for nausea and abdominal pain. Neurologic:  No visual changes, wkns, changes in mental status. All other systems reviewed and are otherwise negative except as noted above.  Physical Exam    Blood pressure 161/92, pulse 81, temperature 97.8 F (36.6 C), temperature source Rectal, resp. rate 17, SpO2 100 %.  General: Elderly, frail-appearing male, currently in NAD Psych: Normal affect. Neuro: Alert and oriented X 3. Moves all extremities spontaneously. HEENT: Normal  Neck: Supple without bruits or JVD. Lungs:  Resp regular and unlabored, CTA without wheezing or rales. Heart: RRR no s3, s4, 2/6 SEM.  Abdomen: Soft, non-distended, BS + x 4. Tender to palpation in periumbilical region. Extremities: No clubbing, cyanosis or edema. DP/PT/Radials 2+ and equal bilaterally.  Labs    Troponin [facility] of Care Test)  Recent Labs  07/31/15 1603  TROPIPOC 0.00   No results for input(s): CKTOTAL, CKMB, TROPONINI in the last 72 hours. Lab Results  Component Value Date   WBC 6.8 07/31/2015   HGB 11.2* 07/31/2015   HCT 33.4* 07/31/2015   MCV 87.9 07/31/2015   PLT 250 07/31/2015    Recent Labs Lab 07/31/15 1548  NA 135  K 4.2  CL 103  CO2 26  BUN 15  CREATININE 1.91*  CALCIUM 9.3  PROT 7.1  BILITOT 0.9    ALKPHOS 75  ALT 13*  AST 19  GLUCOSE 108*   Lab Results  Component Value Date   CHOL 71 11/17/2013   HDL 29* 11/17/2013   LDLCALC 27 11/17/2013   TRIG 74 11/17/2013   Lab Results  Component Value Date   DDIMER 2.32* 09/28/2013     Radiology Studies    Dg Chest 2 View  07/31/2015  CLINICAL DATA:  Chest pain and cough over the past 4 days. EXAM: CHEST  2 VIEW COMPARISON:  07/27/2015 FINDINGS: Mild enlargement of the cardiopericardial silhouette. Dense atherosclerotic calcification of the aortic arch. Small retrocardiac hiatal hernia. Thoracic spondylosis.  Known small aneurysm of the aortic arch. Minimal scattered scarring in both lungs. IMPRESSION: 1. Stable appearance of mild cardiomegaly, atherosclerotic thoracic aorta, and small aortic arch aneurysm. 2. Thoracic spondylosis. 3. Scattered scarring in the lungs. 4. Small hiatal hernia. Electronically  Signed   By: Gaylyn Rong M.D.   On: 07/31/2015 13:36   Dg Chest 2 View  07/27/2015  CLINICAL DATA:  Chest tightness, shortness of breath, dizziness, and cough for 5 days. EXAM: CHEST  2 VIEW COMPARISON:  07/07/2014 FINDINGS: New linear infiltration in the left lung base could represent atelectasis or pneumonia. Slight fibrosis in the right lung base is unchanged. Mild cardiac enlargement without vascular congestion. Calcified aorta. No blunting of costophrenic angles. No pneumothorax. Degenerative changes in the spine. IMPRESSION: New infiltration or atelectasis in the left lung base. Electronically Signed   By: Burman Nieves M.D.   On: 07/27/2015 06:33   Ct Soft Tissue Neck Wo Contrast  07/27/2015  CLINICAL DATA:  Chronic, intermittent left-sided neck pain worse for the past month and exacerbated by coughing and swallowing. Patient points to a localized area just left of the trachea. EXAM: CT NECK WITHOUT CONTRAST TECHNIQUE: Multidetector CT imaging of the neck was performed following the standard protocol without intravenous  contrast. COMPARISON:  None. FINDINGS: Pharynx and larynx: No pharyngeal mass identified on this unenhanced study. Larynx is unremarkable. Salivary glands: The left submandibular and both parotid glands are unremarkable. There is asymmetric enlargement of the right submandibular gland with a nodular or tubular contour laterally measuring approximately 1.5 cm in width. No surrounding inflammatory changes present, and no salivary calculi are identified. A 9 x 5 mm nonspecific soft tissue nodule extends to the skin surface anterior to the left parotid gland. Thyroid: Unremarkable. Lymph nodes: Scattered, subcentimeter lymph nodes are noted bilaterally in the neck. No enlarged lymph nodes are identified outside of the right carotid space lesion described below. Vascular: There is a homogeneous soft tissue density mass in the right carotid space measuring 1.9 x 1.4 cm on axial images adn estimated to be 2.5-3 cm in length. This is located between the right internal and external carotid arteries. Its relationship to the internal jugular vein is difficult to determine given the lack of IV contrast. Bilateral carotid bifurcation atherosclerosis with a stents noted on the left. Limited intracranial: No acute abnormality identified. Visualized orbits: Prior bilateral cataract extraction. Mastoids and visualized paranasal sinuses: Chronic appearing right mastoid effusion. Visualized paranasal sinuses are clear. Skeleton: Moderate to advanced cervical disc and facet degeneration at multiple levels. Upper chest: Partially visualized aneurysmal dilatation of the distal aortic arch measuring at least 4.2 cm in diameter with prominent atherosclerotic calcification. IMPRESSION: 1. No soft tissue etiology of left-sided neck pain identified. 2. 2.5-3 cm soft tissue density in the high right carotid space, with assessment limited by lack of IV contrast. Considerations include focal dilation of the internal jugular vein, and enlarged  lymph node, or neoplasm (such as schwannoma). Nonemergent neck MRI (possibly with half dose contrast if patient's renal function allows) may be helpful for further characterization. 3. Asymmetric enlargement of the right submandibular gland versus adjacent soft tissue mass or dilated vein. This could also be assessed on neck MRI. 4. Partially visualized thoracic aortic aneurysm. This can be further evaluated with chest CT. Electronically Signed   By: Sebastian Ache M.D.   On: 07/27/2015 12:47   Ct Chest Wo Contrast  07/28/2015  CLINICAL DATA:  Thoracic aortic aneurysm. EXAM: CT CHEST WITHOUT CONTRAST TECHNIQUE: Multidetector CT imaging of the chest was performed following the standard protocol without IV contrast. COMPARISON:  None available. FINDINGS: Mediastinum/Lymph Nodes: The thoracic aorta is torturous with advanced calcified atherosclerotic plaque throughout its course. The ascending aorta measures 4.3 cm in transverse  diameter. There is a mild fusiform dilation of the distal ascending aorta measuring up to 3.6 cm. The aortic diameter at the arch also demonstrates focal saccular dilation measuring 4.3 cm. transversely. Advanced calcified atherosclerotic disease of the coronary arteries. The heart is mildly enlarged. There is a small pericardial effusion. No masses or pathologically enlarged lymph nodes identified on this un-enhanced exam. Lungs/Pleura: No pulmonary mass, infiltrate, or effusion. Upper abdomen: Moderate in size hiatal hernia. Mild bilateral renal cortical thinning. Musculoskeletal: No chest wall mass or suspicious bone lesions identified. Multilevel osteoarthritic changes of the thoracic spine. IMPRESSION: Tortuosity and calcified atherosclerotic disease of the thoracic aorta with aneurysmal dilation of the distal ascending aorta and aortic arch, with maximal transverse measurement at the arch of 4.3 cm. Recommend semi-annual imaging followup by CTA or MRA and referral to cardiothoracic  surgery if not already obtained. This recommendation follows 2010 ACCF/AHA/AATS/ACR/ASA/SCA/SCAI/SIR/STS/SVM Guidelines for the Diagnosis and Management of Patients With Thoracic Aortic Disease. Circulation. 2010; 121: e266-e36 Mild cardiomegaly with small pericardial effusion. Advanced calcified atherosclerotic disease of the coronary arteries. Moderate hiatal hernia. Bilateral renal cortical thinning. Electronically Signed   By: Ted Mcalpine M.D.   On: 07/28/2015 12:35   Ct Abdomen Pelvis W Contrast  07/31/2015  CLINICAL DATA:  Pt could not speak English. pain and a burn in his left abd and a Hx of ulcer EXAM: CT ABDOMEN AND PELVIS WITH CONTRAST TECHNIQUE: Multidetector CT imaging of the abdomen and pelvis was performed using the standard protocol following bolus administration of intravenous contrast. CONTRAST:  39mL ISOVUE-300 IOPAMIDOL (ISOVUE-300) INJECTION 61% COMPARISON:  None. FINDINGS: Lower chest: Lung bases are clear. Hepatobiliary: No focal hepatic lesion. No biliary duct dilatation. Gallbladder is normal. Common bile duct is normal. Pancreas: Pancreas is normal. No ductal dilatation. No pancreatic inflammation. Spleen: Normal spleen Adrenals/urinary tract: Adrenal glands normal. Bilateral renal cortical thinning. Bilateral renal cortical hypodensities too small to characterize. No hydronephrosis hydroureter. The bladder calculi. Stomach/Bowel: Moderate hiatal hernia. Stomach, duodenum, small-bowel appendix and cecum are normal. Colon rectosigmoid colon. Vascular/Lymphatic: Saccular aneurysm of the infrarenal abdominal aorta measuring 38 mm in diameter (image 84, series 6). No evidence of inflammation surrounding the aorta to suggest acute changes. 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 Short dissection flap within LEFT common iliac artery (image 50, series 2). Reproductive: Prostate normal Other: No free fluid. Musculoskeletal: No aggressive osseous  lesion. IMPRESSION: 1. No acute findings in the abdomen or pelvis. 2. Moderate hiatal hernia 3. Infrarenal abdominal aortic aneurysm. Recommend followup by ultrasound in 2 years. This recommendation follows ACR consensus guidelines: White Paper of the ACR Incidental Findings Committee II on Vascular Findings. J Am Coll Radiol 2013; 10:789-794. Electronically Signed   By: Genevive Bi M.D.   On: 07/31/2015 16:54   Mr Neck Soft Tissue Only W Wo Contrast  07/28/2015  CLINICAL DATA:  Dyspnea with LEFT-sided neck pain. EXAM: MR NECK SOFT TISSUE ONLY WITHOUT AND WITH CONTRAST TECHNIQUE: Multiplanar, multisequence MR imaging was performed both before and after administration of intravenous contrast. CONTRAST:  96mL MULTIHANCE GADOBENATE DIMEGLUMINE 529 MG/ML IV SOLN COMPARISON:  Noncontrast CT neck 07/27/2015. FINDINGS: The study is significantly degraded by the patient's inability to remain motionless and inability to not cough. Redemonstrated is an 11 x 21 x 22 mm homogeneous enhancing lesion which appears to adjacent to or within the RIGHT carotid space. Its orientation on axial postcontrast image 23 series 15 is transverse ovoid, and its orientation on sagittal  postcontrast image 18 series 17 is more vertical. No similar lesions elsewhere are observed. No inflammatory change or enhancement is seen in the LEFT neck to explain the patient's LEFT-sided neck pain with dyspnea. The airway is patent. There is cervical spondylosis. No intracranial abnormalities are detected. IMPRESSION: Severely degraded exam by patient motion. RIGHT neck lesion measuring approximately 1 x 2 x 2 cm demonstrates homogeneous enhancement, lying adjacent to or within the RIGHT carotid space. Differential considerations would include focal dilatation of the internal jugular vein, schwannoma, or isolated lymph node enlargement.Given its apparent encapsulated appearance, this is likely a benign/incidental finding. Consider continued  surveillance CT neck with contrast in 3-6 months as clinically indicated. No inflammatory or neoplastic process in the LEFT neck to correlate with the reported symptoms. Electronically Signed   By: Elsie Stain M.D.   On: 07/28/2015 13:59    EKG & Cardiac Imaging    Echocardiogram: 06/19/2014 Study Conclusions - Left ventricle: The cavity size was normal. There was moderate  focal basal and mild concentric hypertrophy. Systolic function  was mildly to moderately reduced. The estimated ejection fraction  was 40%. Diffuse hypokinesis. There was a reduced contribution of  atrial contraction to ventricular filling, due to increased  ventricular diastolic pressure or atrial contractile dysfunction.  Doppler parameters are consistent with a reversible restrictive  pattern, indicative of decreased left ventricular diastolic  compliance and/or increased left atrial pressure (grade 3  diastolic dysfunction). Doppler parameters are consistent with  high ventricular filling pressure. - Aortic valve: Moderate thickening and calcification, consistent  with sclerosis. - Mitral valve: There was mild regurgitation. - Tricuspid valve: There was trivial regurgitation.  Cardiac Catheterization: 04/2013 Procedural Details: The right wrist was prepped, draped, and anesthetized with 1% lidocaine. Using the modified Seldinger technique, a 5 French sheath was introduced into the right radial artery. 3 mg of verapamil was administered through the sheath, weight-based unfractionated heparin was administered intravenously. Standard Judkins catheters were used for selective coronary angiography and left ventricular pressure measurement.Catheter exchanges were performed over an exchange length guidewire. There were no immediate procedural complications. A TR band was used for radial hemostasis at the completion of the procedure. The patient was transferred to the post catheterization recovery area for further  monitoring. 45 cc of contrast used.   Procedural Findings: Hemodynamics: AO 125/72 mean 95 mm Hg LV 130/11 mm Hg  Coronary angiography: Coronary dominance: right  Left mainstem: There is 60-70% stenosis in the distal left main.   Left anterior descending (LAD): The LAD is a large vessel extending around the apex. In the mid vessel there is a 60-70% stenosis. The first diagonal is small in caliber with diffuse 80-90% disease in the mid vessel. The second diagonal is also small in caliber with 80% disease proximally.   Ramus intermediate: This is a large branch without significant disease.   Left circumflex (LCx): The LCx is small and gives rise to 2 small OM branches. There is 90% disease at the origin of the LCx. There is 80% disease at the bifurcation of the OMs.   Right coronary artery (RCA): The RCA is diffusely diseased in the proximal vessel and 100% occluded in the mid vessel. There are right to right and left to right collaterals to the distal RCA.  Left ventriculography: No done.   Final Conclusions:  1. 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 His most severe disease is in the small diagonal and LCx branches.  2. Normal LV EDP.  Recommendations: Given age and co-morbidities I would recommend medical therapy.   Assessment & Plan    1. Chest Pain/ CAD - most recent cath in 04/2013 showed 80% left main stenosis and CTO of RCA. Medical management was recommended at that time secondary to his comorbidities. Recently admitted for chest pain, thought to be of MSK etiology. - he presents today and family reports he has been having constant pain for the past 8 days. Has experienced chronic dyspnea, but no acute changes. He does have abdominal discomfort which is reproducible on palpation. - two troponin values while in the ED have been negative. EKG shows 1st degree AV block, HR 91, PVC's, and nonspecific ST changes in the inferior leads. - with chronic pain and negative  troponin values, do not think this is an acute event. - would continue with ASA, statin, BB, and Imdur. Would increase Imdur from 15mg  daily to 30mg  daily.  2. PAF - currently in NSR. - This patients CHA2DS2-VASc Score and unadjusted Ischemic Stroke Rate (% per year) is equal to 11.2 % stroke rate/year from a score of 7 (CHF, HTN, Vascular, Age (2), Stroke (2)). Continue Xarelto. - continue BB  3. Ischemic cardiomyopathy - EF 40% by echo in 06/2014. Does not appear volume overloaded on examination. - continue PTA Lasix and BB.   Signed, Ellsworth Lennox, PA-C 07/31/2015, 5:36 PM Pager: [phone] As above, patient seen and examined. Briefly he is an 82 year old male with past medical history of severe coronary artery disease treated medically, paroxysmal atrial fibrillation, ischemic cardiomyopathy, hypertension, hyperlipidemia, cerebrovascular disease status post previous carotid stent for evaluation of chest pain. Previous cardiac catheterization in March 2015 showed severe coronary artery disease. He was not felt to be a surgical candidate because of comorbidities and age and has been treated medically. Note he does not speak [place] and history is obtained with the help of his daughter. Patient apparently has had intermittent abdominal pain and chest pain for years. His most recent episode started 8 days ago. He has pain in the lower abdomen and tightness in his chest. The tightness increases with eating. His pain does not radiate. It has been continuous for 8 consecutive days. He was admitted June 16 with these symptoms and ruled out. He was treated for possible pneumonia. His pain has not resolved and he returned and cardiology is asked to evaluate. ECG NSR, PVC, first  Degree AV block, LAD, nonspecific ST changes; troponin negative.  Chest pain of uncertain etiology-symptoms are likely not cardiac as he has both abdominal and chest tightness which appears to be chronic. It has been  present continuously for 8 days and enzymes are negative and electrocardiogram shows no ST changes. He has therefore ruled out. He has known severe coronary disease and only option is medical therapy. Continue ASA, statin and coreg; increase imdur to 30 mg daily. Pain may be GI related as it increases with eating. Continue protonix. He may need GI evaluation. Will leave to primary care. He should follow-up with Dr. Allyson Sabal as an outpatient. Will not plan further cardiac evaluation. Please call with questions. Olga Millers

## 2015-07-31 NOTE — ED Notes (Signed)
Family at bedside. 

## 2015-07-31 NOTE — Discharge Instructions (Signed)
Increase imdur to 30 mg a day.  Stop protonix and restart nexium.  Recheck with his doctor this week.   Nonspecific Chest Pain It is often hard to find the cause of chest pain. There is always a chance that your pain could be related to something serious, such as a heart attack or a blood clot in your lungs. Chest pain can also be caused by conditions that are not life-threatening. If you have chest pain, it is very important to follow up with your doctor.  HOME CARE  If you were prescribed an antibiotic medicine, finish it all even if you start to feel better.  Avoid any activities that cause chest pain.  Do not use any tobacco products, including cigarettes, chewing tobacco, or electronic cigarettes. If you need help quitting, ask your doctor.  Do not drink alcohol.  Take medicines only as told by your doctor.  Keep all follow-up visits as told by your doctor. This is important. This includes any further testing if your chest pain does not go away.  Your doctor may tell you to keep your head raised (elevated) while you sleep.  Make lifestyle changes as told by your doctor. These may include:  Getting regular exercise. Ask your doctor to suggest some activities that are safe for you.  Eating a heart-healthy diet. Your doctor or a diet specialist (dietitian) can help you to learn healthy eating options.  Maintaining a healthy weight.  Managing diabetes, if necessary.  Reducing stress. GET HELP IF:  Your chest pain does not go away, even after treatment.  You have a rash with blisters on your chest.  You have a fever. GET HELP RIGHT AWAY IF:  Your chest pain is worse.  You have an increasing cough, or you cough up blood.  You have severe belly (abdominal) pain.  You feel extremely weak.  You pass out (faint).  You have chills.  You have sudden, unexplained chest discomfort.  You have sudden, unexplained discomfort in your arms, back, neck, or jaw.  You have  shortness of breath at any time.  You suddenly start to sweat, or your skin gets clammy.  You feel nauseous.  You vomit.  You suddenly feel light-headed or dizzy.  Your heart begins to beat quickly, or it feels like it is skipping beats. These symptoms may be an emergency. Do not wait to see if the symptoms will go away. Get medical help right away. Call your local emergency services (911 in the [place] Do not drive yourself to the hospital.   This information is not intended to replace advice given to you by your health care provider. Make sure you discuss any questions you have with your health care provider.   Document Released: 07/16/2007 Document Revised: 02/17/2014 Document Reviewed: 09/02/2013 Elsevier Interactive Patient Education [employer]

## 2015-07-31 NOTE — ED Notes (Signed)
MD at bedside.RAY

## 2015-07-31 NOTE — ED Provider Notes (Signed)
CSN: [id number]     Arrival date & time 07/31/15  1241 History   First Robinson Initiated Contact with Patient 07/31/15 1504     Chief Complaint  Patient presents with  . Chest Pain    Level 5 caveat secondary to language barrier and hoh- attempted phone translator but patient to hoh HPI   82 year old man presents today complaining of ongoing chest and abdominal pain. He was discharged on Sunday after admission for evaluation of chest pain. Last cardiac catheterization 2016 revealed 80% left main with an occluded dominant RCA treated medically. Had some chronic intermittent centralized chest pain. Pain has been disabled for years and sometimes is alleviated with acid blockers. He also has left-sided neck pain which was evaluated during his last hospitalization. During hospitalization he had cardiac enzymes that were negative 3. He had no significant events on telemetry. It was thought to be likely musculoskeletal. Daughter states that pain he is presenting for today is the same pain that he has had. She states it never really resolved. He has continued to complain of it although he appeared better yesterday. She states that last night he started complaining more of the pain. They deny that it was ever resolved. He describes it as in the lower chest. During last hospitalization he was treated with Rocephin and Zithromax. He is discharged on Levaquin to be taken every other day. Took his first dose yesterday. He is also complaining of lower abdominal pain. He denies any increased frequency of stooling, blood in stool, change in urinary habits, fever but endorses having some chills.  Past Medical History  Diagnosis Date  . Hypertension   . Gout   . Hard of hearing   . Anemia   . AAA (abdominal aortic aneurysm) (HCC)   . Depression   . Hyperplasia, prostate   . Asthma   . H. pylori infection   . PUD (peptic ulcer disease)   . Hiatal hernia   . Renal failure, acute (HCC) 11/08/2012  . Carotid artery  disease (HCC)   . Coronary artery disease   . CHF (congestive heart failure) [facility])    Past Surgical History  Procedure Laterality Date  . Esophagogastroduodenoscopy  06/13/2008,12/31/12  . Carotid stent insertion  2015  . Left heart catheterization with coronary angiogram N/A 05/04/2013    Procedure: LEFT HEART CATHETERIZATION WITH CORONARY ANGIOGRAM;  Surgeon: Jonathon M Swaz[place], Robinson;  Location: [facility] CATH LAB;  Service: Cardiovascular;  Laterality: N/A;  . Carotid stent insertion N/A 05/09/2013    Procedure: CAROTID STENT INSERTION;  Surgeon: Jonathon Gess, Robinson;  Location: [facility] CATH LAB;  Service: Cardiovascular;  Laterality: N/A;   Family History  Problem Relation Age of Onset  . Colon cancer Neg Hx    Social History  Substance Use Topics  . Smoking status: Former Smoker    Quit date: 02/17/2002  . Smokeless tobacco: Never Used     Comment: Quit seven years ago   . Alcohol Use: No    Review of Systems  All other systems reviewed and are negative.     Allergies  Levofloxacin; Eliquis; Phenazopyridine hcl; and Septra  Home Medications   Prior to Admission medications   Medication Sig Start Date End Date Taking? Authorizing Provider  allopurinol (ZYLOPRIM) 300 MG tablet Take 300 mg by mouth daily.   Yes [name]  aspirin EC 81 MG tablet Take 1 tablet (81 mg total) by mouth daily. 09/08/13  Yes Jonathon Plan, Robinson  atorvastatin (LIPITOR) 40  MG tablet Take 1 tablet (40 mg total) by mouth daily at 6 PM. 05/11/13  Yes Jonathon Sherlynn Carbon, PA-C  carvedilol (COREG) 3.125 MG tablet Take 1 tablet (3.125 mg total) by mouth 2 (two) times daily with a meal. 06/19/14  Yes Jonathon Loll, Robinson  ipratropium-albuterol (DUONEB) 0.5-2.5 (3) MG/3ML SOLN Take 3 mLs by nebulization 2 (two) times daily. May use 1 additional dose during the day if needed   Yes [name]  isosorbide mononitrate (IMDUR) 30 MG 24 hr tablet TAKE ONE-HALF TABLET BY MOUTH DAILY Patient taking differently: Take  30 mg by mouth once daily 03/15/14  Yes Jonathon Gess, Robinson  levofloxacin (LEVAQUIN) 500 MG tablet Take 1 tablet (500 mg total) by mouth every other day. 07/30/15  Yes Jonathon Arms, Robinson  nitroGLYCERIN (NITROSTAT) 0.4 MG SL tablet Place 1 tablet (0.4 mg total) under the tongue every 5 (five) minutes x 3 doses as needed for chest pain. 05/11/13  Yes Jonathon Sherlynn Carbon, PA-C  pantoprazole (PROTONIX) 40 MG tablet Take 1 tablet (40 mg total) by mouth 2 (two) times daily. 11/19/13  Yes Jonathon Loll, Robinson  Rivaroxaban (XARELTO) 15 MG TABS tablet Take 15 mg by mouth daily.   Yes [name]  tamsulosin (FLOMAX) 0.4 MG CAPS capsule Take 0.4 mg by mouth daily as needed.    Yes [name]  furosemide (LASIX) 20 MG tablet Take 1 tablet (20 mg total) by mouth every other day. Patient not taking: Reported on 07/27/2015 06/19/14   Jonathon Loll, Robinson   BP 161/92 mmHg  Pulse 81  Temp(Src) 97.8 F (36.6 C) (Rectal)  Resp 17  SpO2 100% Physical Exam  Constitutional: He appears well-developed and well-nourished. No distress.  HENT:  Head: Normocephalic and atraumatic.  Right Ear: External ear normal.  Left Ear: External ear normal.  Nose: Nose normal.  Mouth/Throat: Oropharynx is clear and moist.  Eyes: Conjunctivae and EOM are normal. Pupils are equal, round, and reactive to light.  Neck: Normal range of motion. Neck supple.  Cardiovascular: Normal rate, regular rhythm and normal heart sounds.   Pulmonary/Chest: Effort normal and breath sounds normal. No respiratory distress. He has no wheezes. He has no rales.  Abdominal: Soft. Bowel sounds are normal. There is tenderness.  Mild diffuse lower abdominal tenderness to palpation  Genitourinary: No penile tenderness.  Rectal exam normal  Musculoskeletal: Normal range of motion. He exhibits no edema or tenderness.  Neurological: He is alert. He displays normal reflexes. No cranial nerve deficit. He exhibits normal muscle tone.  Coordination normal.  Skin: Skin is warm and dry.  Nursing note and vitals reviewed.   ED Course  Procedures (including critical care time) Labs Review Labs Reviewed  BASIC METABOLIC PANEL - Abnormal; Notable for the following:    Sodium 132 (*)    Glucose, Bld 114 (*)    Creatinine, Ser 1.88 (*)    GFR calc non Af Amer 31 (*)    GFR calc Af Amer 35 (*)    All other components within normal limits  CBC - Abnormal; Notable for the following:    RBC 3.80 (*)    Hemoglobin 11.2 (*)    HCT 33.4 (*)    RDW 15.8 (*)    All other components within normal limits  COMPREHENSIVE METABOLIC PANEL - Abnormal; Notable for the following:    Glucose, Bld 108 (*)    Creatinine, Ser 1.91 (*)    ALT 13 (*)  GFR calc non Af Amer 30 (*)    GFR calc Af Amer 35 (*)    All other components within normal limits  LIPASE, BLOOD  URINALYSIS, ROUTINE W REFLEX MICROSCOPIC (NOT AT [facility])  BRAIN NATRIURETIC PEPTIDE  I-STAT TROPOININ, ED  I-STAT TROPOININ, ED  POC OCCULT BLOOD, ED    Imaging Review Dg Chest 2 View  07/31/2015  CLINICAL DATA:  Chest pain and cough over the past 4 days. EXAM: CHEST  2 VIEW COMPARISON:  07/27/2015 FINDINGS: Mild enlargement of the cardiopericardial silhouette. Dense atherosclerotic calcification of the aortic arch. Small retrocardiac hiatal hernia. Thoracic spondylosis.  Known small aneurysm of the aortic arch. Minimal scattered scarring in both lungs. IMPRESSION: 1. Stable appearance of mild cardiomegaly, atherosclerotic thoracic aorta, and small aortic arch aneurysm. 2. Thoracic spondylosis. 3. Scattered scarring in the lungs. 4. Small hiatal hernia. Electronically Signed   By: Gaylyn Rong M.D.   On: 07/31/2015 13:36   Ct Abdomen Pelvis W Contrast  07/31/2015  CLINICAL DATA:  Pt could not speak English. pain and a burn in his left abd and a Hx of ulcer EXAM: CT ABDOMEN AND PELVIS WITH CONTRAST TECHNIQUE: Multidetector CT imaging of the abdomen and pelvis was  performed using the standard protocol following bolus administration of intravenous contrast. CONTRAST:  25mL ISOVUE-300 IOPAMIDOL (ISOVUE-300) INJECTION 61% COMPARISON:  None. FINDINGS: Lower chest: Lung bases are clear. Hepatobiliary: No focal hepatic lesion. No biliary duct dilatation. Gallbladder is normal. Common bile duct is normal. Pancreas: Pancreas is normal. No ductal dilatation. No pancreatic inflammation. Spleen: Normal spleen Adrenals/urinary tract: Adrenal glands normal. Bilateral renal cortical thinning. Bilateral renal cortical hypodensities too small to characterize. No hydronephrosis hydroureter. The bladder calculi. Stomach/Bowel: Moderate hiatal hernia. Stomach, duodenum, small-bowel appendix and cecum are normal. Colon rectosigmoid colon. Vascular/Lymphatic: Saccular aneurysm of the infrarenal abdominal aorta measuring 38 mm in diameter (image 84, series 6). No evidence of inflammation surrounding the aorta to suggest acute changes. 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 Short dissection flap within LEFT common iliac artery (image 50, series 2). Reproductive: Prostate normal Other: No free fluid. Musculoskeletal: No aggressive osseous lesion. IMPRESSION: 1. No acute findings in the abdomen or pelvis. 2. Moderate hiatal hernia 3. Infrarenal abdominal aortic aneurysm. Recommend followup by ultrasound in 2 years. This recommendation follows ACR consensus guidelines: White Paper of the ACR Incidental Findings Committee II on Vascular Findings. J Am Coll Radiol 2013; 10:789-794. Electronically Signed   By: Genevive Bi M.D.   On: 07/31/2015 16:54   I have personally reviewed and evaluated these images and lab results as part of my medical decision-making.   EKG Interpretation   Date/Time:  Tuesday July 31 2015 12:48:02 EDT Ventricular Rate:  91 PR Interval:  248 QRS Duration: 102 QT Interval:  386 QTC Calculation: 474 R Axis:   -45 Text  Interpretation:  Sinus rhythm with 1st degree A-V block with  occasional Premature ventricular complexes Left axis deviation Nonspecific  ST and T wave abnormality Prolonged QT Abnormal ECG No significant change  since last tracing Confirmed by Traycen Goyer Robinson, Duwayne Heck [phone]) on 07/31/2015  3:05:34 PM      MDM   Final diagnoses:  Chest pain, unspecified chest pain type  Lower abdominal pain  Renal insufficiency  Coronary artery disease involving native coronary artery of native heart with angina pectoris (HCC)    1- chest pain- ekg unchanged, troponin normal,  Patient has history of gastritis and daughter  feels that he was previously better on Nexium and a pantoprazole. Patient will have his Protonix discontinued I'll restart his Nexium. Cardiology consult obtained please see full consult from Dr. [name]. Advised to increased Imdur.  Unclear etiology of chest pain, however patient has had recent hospitalization with rule out an here today continues to have unchanged EKG with normal troponin. Cardiology consult obtained today do not think this is cardiology etiology although patient does have severe coronary artery disease. Patient also noted previously to have aneurysm of thoracic aorta. Repeat CT today shows no change in this. Patient is anticoagulated. Although, pulmonary embolism, is in differential diagnosis, due to renal insufficiency he is unable to have CT angios chest and is on chronic anti-coagulation.   family states that he has pneumonia and are questioning whether or not he should continue his Levaquin  CT reveals no evidence of pulmonary effusion or infiltrate. Family feels that he had a bad reaction to the Levaquin and would like to stop it. I advise completing this course, however I did discuss them that there is no pneumonia seen on the CT. 2 abdominal pain patient also complained of lower abdominal pain. CT obtained shows no evidence of abnormality 3 renal insufficiency patient's  creatinine is 1.9 today-this has been stable for the past month however it has been gradually worsening prior to this. Family is advised that he should be followed by his primary care for worsening renal function 4 mild anemia  I have discussed all above results with patient and family. They are advised regarding close follow-up and return precautions.  Margarita Grizzle, Robinson 07/31/15 2000

## 2015-07-31 NOTE — ED Notes (Signed)
Pt transported to CT ?

## 2015-08-01 LAB — CULTURE, BLOOD (ROUTINE X 2)
CULTURE: NO GROWTH
CULTURE: NO GROWTH

## 2015-08-08 ENCOUNTER — Ambulatory Visit (INDEPENDENT_AMBULATORY_CARE_PROVIDER_SITE_OTHER): Payer: Medicare Other | Admitting: Cardiovascular Disease

## 2015-08-08 ENCOUNTER — Encounter: Payer: Self-pay | Admitting: Cardiovascular Disease

## 2015-08-08 VITALS — BP 124/76 | HR 86 | Ht 61.0 in | Wt 152.0 lb

## 2015-08-08 DIAGNOSIS — I10 Essential (primary) hypertension: Secondary | ICD-10-CM | POA: Diagnosis not present

## 2015-08-08 DIAGNOSIS — E785 Hyperlipidemia, unspecified: Secondary | ICD-10-CM | POA: Diagnosis not present

## 2015-08-08 DIAGNOSIS — I739 Peripheral vascular disease, unspecified: Secondary | ICD-10-CM

## 2015-08-08 DIAGNOSIS — I480 Paroxysmal atrial fibrillation: Secondary | ICD-10-CM

## 2015-08-08 DIAGNOSIS — I2510 Atherosclerotic heart disease of native coronary artery without angina pectoris: Secondary | ICD-10-CM

## 2015-08-08 DIAGNOSIS — I779 Disorder of arteries and arterioles, unspecified: Secondary | ICD-10-CM

## 2015-08-08 DIAGNOSIS — I2583 Coronary atherosclerosis due to lipid rich plaque: Secondary | ICD-10-CM

## 2015-08-08 NOTE — Assessment & Plan Note (Signed)
History of coronary artery disease status post cardiac catheterization performed 04/24/3013 revealing left main/three-vessel disease. He was thought not to be a bypass candidate and medical therapy was recommended because of comorbidities. He was initially seen in the hospital with atypical chest pain and evaluated by Dr. [name] who thought his pain was noncardiac.

## 2015-08-08 NOTE — Progress Notes (Signed)
08/08/2015 Elvin Overfield   [birth year]  [id number]  Primary Physician [name], MD Primary Cardiologist: [name] [name]  HPI:  Mr. Lightner is a 82 year old old married [place] male who has a history of congestive heart failure secondary to ischemic cardiomyopathy. I last saw him in the office 08/02/14.[name] He was found to be in atrial fibrillation and apparently converted to sinus rhythm. The echocardiogram revealed severe dysfunction with an ejection fraction in the 35% range. He had stroke like symptoms and was evaluated by the stroke service. An MRI suggested multiple embolic strokes possibly related to the atial fibrillation.carotid Dopplers were performed and suggested high-grade left internal carotid artery stenosis with string sign". The patient underwent cardiac catheterization by Dr. Swaziland the right radial approach that revealed 80% left main stenosis with an occluded dominant RCA.. Consensus was to treat him medically for this. I was asked to see the patient for consideration of left internal carotid artery stenting. Extensive conversation was had with the patient and his family and after careful consideration of the risks and benefits of medical therapy versus percutaneous intervention, it was decided to proceed with carotid artery stenting.which was performed on 04/28/13 successfully.his post procedure Dopplers in our office were excellent as were his most recent Dopplers in October during the hospitalization. He did have paroxysmal atrial fibrillation with strokes and has maintained sinus rhythm on Xarelto oral anticoagulation. Since I saw him 6 months ago he's remained relatively stable. He was admitted last month with chest pain and ruled out for myocardial infarction. A 2-D echo revealed an ejection fraction of 40%. He does admit to dietary indiscretion with regard to salt. Since I saw him in the office and admitted to [facility] earlier  this month with atypical chest and abdominal pain. He ruled out for myocardial infarction. He was seen in consultation by Dr. [name] who did not feel that his pain was cardiac in nature. He did have carotid Dopplers performed in January of this year revealed widely patent carotid stent.   Current Outpatient Prescriptions  Medication Sig Dispense Refill  . allopurinol (ZYLOPRIM) 300 MG tablet Take 300 mg by mouth daily.    [name] aspirin EC 81 MG tablet Take 1 tablet (81 mg total) by mouth daily. 90 tablet 3  . atorvastatin (LIPITOR) 40 MG tablet Take 1 tablet (40 mg total) by mouth daily at 6 PM. 30 tablet 5  . carvedilol (COREG) 3.125 MG tablet Take 1 tablet (3.125 mg total) by mouth 2 (two) times daily with a meal. 60 tablet 5  . esomeprazole (NEXIUM) 40 MG capsule Take 1 capsule (40 mg total) by mouth daily. 30 capsule 0  . ipratropium-albuterol (DUONEB) 0.5-2.5 (3) MG/3ML SOLN Take 3 mLs by nebulization 2 (two) times daily. May use 1 additional dose during the day if needed    . isosorbide mononitrate (IMDUR) 30 MG 24 hr tablet Take 30 mg by mouth once daily 15 tablet 9  . nitroGLYCERIN (NITROSTAT) 0.4 MG SL tablet Place 1 tablet (0.4 mg total) under the tongue every 5 (five) minutes x 3 doses as needed for chest pain. 25 tablet 5  . Rivaroxaban (XARELTO) 15 MG TABS tablet Take 15 mg by mouth daily.    . tamsulosin (FLOMAX) 0.4 MG CAPS capsule Take 0.4 mg by mouth daily as needed.     . [DISCONTINUED] apixaban (ELIQUIS) 2.5 MG TABS tablet Take 1 tablet (2.5 mg total) by mouth 2 (two) times daily.  60 tablet 3  . [DISCONTINUED] pantoprazole (PROTONIX) 40 MG tablet Take 1 tablet (40 mg total) by mouth 2 (two) times daily. 60 tablet 5   No current facility-administered medications for this visit.    Allergies  Allergen Reactions  . Levofloxacin Anaphylaxis and Other (See Comments)    Patient told his daughter that it made him "feel worse than he already did" (overall) Headache also (has refused  to take anymore)  . Eliquis [Apixaban] Itching and Swelling  . Phenazopyridine Hcl Other (See Comments)    Unknown reaction  . Septra [Sulfamethoxazole-Trimethoprim] Nausea And Vomiting    GI Upset    Social History   Social History  . Marital Status: Married    Spouse Name: san  . Number of Children: 55  . Years of Education: 4th grade   Occupational History  . retired    Social History Main Topics  . Smoking status: Former Smoker    Quit date: 02/17/2002  . Smokeless tobacco: Never Used     Comment: Quit seven years ago   . Alcohol Use: No  . Drug Use: No  . Sexual Activity: No   Other Topics Concern  . Not on file   Social History Narrative   Patient lives at home with his daughter   Patient is right handed    Patient drinks tea and coffee     Review of Systems: General: negative for chills, fever, night sweats or weight changes.  Cardiovascular: negative for chest pain, dyspnea on exertion, edema, orthopnea, palpitations, paroxysmal nocturnal dyspnea or shortness of breath Dermatological: negative for rash Respiratory: negative for cough or wheezing Urologic: negative for hematuria Abdominal: negative for nausea, vomiting, diarrhea, bright red blood per rectum, melena, or hematemesis Neurologic: negative for visual changes, syncope, or dizziness All other systems reviewed and are otherwise negative except as noted above.    Blood pressure 124/76, pulse 86, height 5\' 1"  (1.549 m), weight 152 lb (68.947 kg).  General appearance: alert and no distress Neck: no adenopathy, no carotid bruit, no JVD, supple, symmetrical, trachea midline and thyroid not enlarged, symmetric, no tenderness/mass/nodules Lungs: clear to auscultation bilaterally Abdomen: soft, non-tender; bowel sounds normal; no masses,  no organomegaly  EKG not performed today.  ASSESSMENT AND PLAN:   Hypertension History of hypertension blood pressure measured today at 124/76. He is on  carvedilol. Continue current meds at current dosing  Atrial fibrillation- unknown duration History of atrial fibrillation on Xarelto oral anticoagulation.  Hyperlipidemia History of hyperlipidemia on statin therapy followed by his PCP  Carotid artery disease (HCC) History of carotid artery disease status post left internal carotid artery stenting by myself 04/28/13. His most recent carotid Dopplers performed in our office in January revealed his head to be widely patent.  Coronary artery disease History of coronary artery disease status post cardiac catheterization performed 04/24/3013 revealing left main/three-vessel disease. He was thought not to be a bypass candidate and medical therapy was recommended because of comorbidities. He was initially seen in the hospital with atypical chest pain and evaluated by Dr. [name] who thought his pain was noncardiac.      [name] FACP,FACC,FAHA, [facility] 08/08/2015 12:31 PM

## 2015-08-08 NOTE — Assessment & Plan Note (Signed)
History of atrial fibrillation on Xarelto oral anticoagulation.

## 2015-08-08 NOTE — Assessment & Plan Note (Signed)
History of hypertension blood pressure measured today at 124/76. He is on carvedilol. Continue current meds at current dosing

## 2015-08-08 NOTE — Assessment & Plan Note (Signed)
History of hyperlipidemia on statin therapy followed by his PCP 

## 2015-08-08 NOTE — Assessment & Plan Note (Signed)
History of carotid artery disease status post left internal carotid artery stenting by myself 04/28/13. His most recent carotid Dopplers performed in our office in January revealed his head to be widely patent.

## 2015-08-08 NOTE — Patient Instructions (Signed)
Medication Instructions:  Your physician recommends that you continue on your current medications as directed. Please refer to the Current Medication list given to you today.     Follow-Up: We request that you follow-up in: 6 MONTHS with an extender and in 12 MONTHS with Dr Berry  You will receive a reminder letter in the mail two months in advance. If you don't receive a letter, please call our office to schedule the follow-up appointment.    Any Other Special Instructions Will Be Listed Below (If Applicable).     If you need a refill on your cardiac medications before your next appointment, please call your pharmacy.   

## 2015-10-03 ENCOUNTER — Inpatient Hospital Stay (HOSPITAL_COMMUNITY)
Admission: EM | Admit: 2015-10-03 | Discharge: 2015-10-10 | DRG: 871 | Disposition: A | Discharge status: Home Health | Payer: Medicare Other | Attending: Family Medicine | Admitting: Family Medicine

## 2015-10-03 ENCOUNTER — Emergency Department (HOSPITAL_COMMUNITY): Payer: Medicare Other

## 2015-10-03 ENCOUNTER — Encounter (HOSPITAL_COMMUNITY): Payer: Self-pay | Admitting: Neurology

## 2015-10-03 DIAGNOSIS — I2510 Atherosclerotic heart disease of native coronary artery without angina pectoris: Secondary | ICD-10-CM | POA: Diagnosis present

## 2015-10-03 DIAGNOSIS — J9620 Acute and chronic respiratory failure, unspecified whether with hypoxia or hypercapnia: Secondary | ICD-10-CM | POA: Diagnosis not present

## 2015-10-03 DIAGNOSIS — I130 Hypertensive heart and chronic kidney disease with heart failure and stage 1 through stage 4 chronic kidney disease, or unspecified chronic kidney disease: Secondary | ICD-10-CM | POA: Diagnosis present

## 2015-10-03 DIAGNOSIS — E871 Hypo-osmolality and hyponatremia: Secondary | ICD-10-CM | POA: Diagnosis present

## 2015-10-03 DIAGNOSIS — N400 Enlarged prostate without lower urinary tract symptoms: Secondary | ICD-10-CM | POA: Diagnosis present

## 2015-10-03 DIAGNOSIS — I248 Other forms of acute ischemic heart disease: Secondary | ICD-10-CM | POA: Diagnosis present

## 2015-10-03 DIAGNOSIS — E860 Dehydration: Secondary | ICD-10-CM | POA: Diagnosis present

## 2015-10-03 DIAGNOSIS — I482 Chronic atrial fibrillation: Secondary | ICD-10-CM

## 2015-10-03 DIAGNOSIS — N179 Acute kidney failure, unspecified: Secondary | ICD-10-CM | POA: Diagnosis present

## 2015-10-03 DIAGNOSIS — A419 Sepsis, unspecified organism: Principal | ICD-10-CM | POA: Diagnosis present

## 2015-10-03 DIAGNOSIS — J9621 Acute and chronic respiratory failure with hypoxia: Secondary | ICD-10-CM | POA: Diagnosis not present

## 2015-10-03 DIAGNOSIS — R112 Nausea with vomiting, unspecified: Secondary | ICD-10-CM | POA: Diagnosis present

## 2015-10-03 DIAGNOSIS — Z91040 Latex allergy status: Secondary | ICD-10-CM

## 2015-10-03 DIAGNOSIS — E785 Hyperlipidemia, unspecified: Secondary | ICD-10-CM | POA: Diagnosis present

## 2015-10-03 DIAGNOSIS — R6520 Severe sepsis without septic shock: Secondary | ICD-10-CM | POA: Diagnosis present

## 2015-10-03 DIAGNOSIS — A084 Viral intestinal infection, unspecified: Secondary | ICD-10-CM | POA: Diagnosis present

## 2015-10-03 DIAGNOSIS — Z881 Allergy status to other antibiotic agents status: Secondary | ICD-10-CM

## 2015-10-03 DIAGNOSIS — Z7901 Long term (current) use of anticoagulants: Secondary | ICD-10-CM

## 2015-10-03 DIAGNOSIS — R109 Unspecified abdominal pain: Secondary | ICD-10-CM | POA: Diagnosis present

## 2015-10-03 DIAGNOSIS — Z91048 Other nonmedicinal substance allergy status: Secondary | ICD-10-CM

## 2015-10-03 DIAGNOSIS — I480 Paroxysmal atrial fibrillation: Secondary | ICD-10-CM | POA: Diagnosis present

## 2015-10-03 DIAGNOSIS — E1122 Type 2 diabetes mellitus with diabetic chronic kidney disease: Secondary | ICD-10-CM | POA: Diagnosis present

## 2015-10-03 DIAGNOSIS — J189 Pneumonia, unspecified organism: Secondary | ICD-10-CM | POA: Diagnosis present

## 2015-10-03 DIAGNOSIS — Y95 Nosocomial condition: Secondary | ICD-10-CM | POA: Diagnosis present

## 2015-10-03 DIAGNOSIS — J440 Chronic obstructive pulmonary disease with acute lower respiratory infection: Secondary | ICD-10-CM | POA: Diagnosis present

## 2015-10-03 DIAGNOSIS — N183 Chronic kidney disease, stage 3 unspecified: Secondary | ICD-10-CM | POA: Diagnosis present

## 2015-10-03 DIAGNOSIS — Z66 Do not resuscitate: Secondary | ICD-10-CM | POA: Diagnosis present

## 2015-10-03 DIAGNOSIS — R0902 Hypoxemia: Secondary | ICD-10-CM

## 2015-10-03 DIAGNOSIS — I5042 Chronic combined systolic (congestive) and diastolic (congestive) heart failure: Secondary | ICD-10-CM | POA: Diagnosis present

## 2015-10-03 DIAGNOSIS — F419 Anxiety disorder, unspecified: Secondary | ICD-10-CM | POA: Diagnosis present

## 2015-10-03 DIAGNOSIS — R197 Diarrhea, unspecified: Secondary | ICD-10-CM | POA: Diagnosis present

## 2015-10-03 DIAGNOSIS — I779 Disorder of arteries and arterioles, unspecified: Secondary | ICD-10-CM | POA: Diagnosis present

## 2015-10-03 DIAGNOSIS — J441 Chronic obstructive pulmonary disease with (acute) exacerbation: Secondary | ICD-10-CM | POA: Diagnosis present

## 2015-10-03 DIAGNOSIS — H9190 Unspecified hearing loss, unspecified ear: Secondary | ICD-10-CM | POA: Diagnosis present

## 2015-10-03 DIAGNOSIS — J9601 Acute respiratory failure with hypoxia: Secondary | ICD-10-CM

## 2015-10-03 DIAGNOSIS — R7989 Other specified abnormal findings of blood chemistry: Secondary | ICD-10-CM | POA: Diagnosis present

## 2015-10-03 DIAGNOSIS — K219 Gastro-esophageal reflux disease without esophagitis: Secondary | ICD-10-CM | POA: Diagnosis present

## 2015-10-03 DIAGNOSIS — Z7982 Long term (current) use of aspirin: Secondary | ICD-10-CM

## 2015-10-03 DIAGNOSIS — Z95828 Presence of other vascular implants and grafts: Secondary | ICD-10-CM

## 2015-10-03 DIAGNOSIS — J9811 Atelectasis: Secondary | ICD-10-CM

## 2015-10-03 DIAGNOSIS — I739 Peripheral vascular disease, unspecified: Secondary | ICD-10-CM

## 2015-10-03 DIAGNOSIS — Z87891 Personal history of nicotine dependence: Secondary | ICD-10-CM

## 2015-10-03 DIAGNOSIS — J449 Chronic obstructive pulmonary disease, unspecified: Secondary | ICD-10-CM

## 2015-10-03 DIAGNOSIS — I10 Essential (primary) hypertension: Secondary | ICD-10-CM | POA: Diagnosis not present

## 2015-10-03 DIAGNOSIS — Z888 Allergy status to other drugs, medicaments and biological substances status: Secondary | ICD-10-CM

## 2015-10-03 DIAGNOSIS — R778 Other specified abnormalities of plasma proteins: Secondary | ICD-10-CM | POA: Diagnosis present

## 2015-10-03 LAB — DIFFERENTIAL
Basophils Absolute: 0 10*3/uL (ref 0.0–0.1)
Basophils Relative: 0 %
EOS PCT: 0 %
Eosinophils Absolute: 0.1 10*3/uL (ref 0.0–0.7)
LYMPHS PCT: 3 %
Lymphs Abs: 0.8 10*3/uL (ref 0.7–4.0)
Monocytes Absolute: 1.4 10*3/uL — ABNORMAL HIGH (ref 0.1–1.0)
Monocytes Relative: 5 %
NEUTROS PCT: 92 %
Neutro Abs: 23.7 10*3/uL — ABNORMAL HIGH (ref 1.7–7.7)

## 2015-10-03 LAB — BASIC METABOLIC PANEL
ANION GAP: 10 (ref 5–15)
BUN: 26 mg/dL — ABNORMAL HIGH (ref 6–20)
CO2: 19 mmol/L — AB (ref 22–32)
Calcium: 8.8 mg/dL — ABNORMAL LOW (ref 8.9–10.3)
Chloride: 99 mmol/L — ABNORMAL LOW (ref 101–111)
Creatinine, Ser: 3 mg/dL — ABNORMAL HIGH (ref 0.61–1.24)
GFR calc Af Amer: 20 mL/min — ABNORMAL LOW (ref >60)
GFR calc non Af Amer: 17 mL/min — ABNORMAL LOW (ref >60)
GLUCOSE: 118 mg/dL — AB (ref 65–99)
POTASSIUM: 3.9 mmol/L (ref 3.5–5.1)
Sodium: 128 mmol/L — ABNORMAL LOW (ref 135–145)

## 2015-10-03 LAB — CBC
HEMATOCRIT: 32.5 % — AB (ref 39.0–52.0)
HEMOGLOBIN: 10.8 g/dL — AB (ref 13.0–17.0)
MCH: 30.3 pg (ref 26.0–34.0)
MCHC: 33.2 g/dL (ref 30.0–36.0)
MCV: 91.3 fL (ref 78.0–100.0)
Platelets: 194 10*3/uL (ref 150–400)
RBC: 3.56 MIL/uL — ABNORMAL LOW (ref 4.22–5.81)
RDW: 15.4 % (ref 11.5–15.5)
WBC: 27.6 10*3/uL — ABNORMAL HIGH (ref 4.0–10.5)

## 2015-10-03 LAB — URINE MICROSCOPIC-ADD ON

## 2015-10-03 LAB — I-STAT TROPONIN, ED: Troponin i, poc: 0.72 ng/mL (ref 0.00–0.08)

## 2015-10-03 LAB — URINALYSIS, ROUTINE W REFLEX MICROSCOPIC
Glucose, UA: NEGATIVE mg/dL
Ketones, ur: 15 mg/dL — AB
LEUKOCYTES UA: NEGATIVE
NITRITE: NEGATIVE
PROTEIN: 100 mg/dL — AB
Specific Gravity, Urine: 1.021 (ref 1.005–1.030)
pH: 6 (ref 5.0–8.0)

## 2015-10-03 LAB — APTT: aPTT: 71 seconds — ABNORMAL HIGH (ref 24–36)

## 2015-10-03 LAB — I-STAT CG4 LACTIC ACID, ED: Lactic Acid, Venous: 2.46 mmol/L (ref 0.5–1.9)

## 2015-10-03 LAB — PROTIME-INR
INR: 1.74
PROTHROMBIN TIME: 20.6 s — AB (ref 11.4–15.2)

## 2015-10-03 MED ORDER — TAMSULOSIN HCL 0.4 MG PO CAPS
0.4000 mg | ORAL_CAPSULE | Freq: Every day | ORAL | Status: DC
  Administered 2015-10-04 – 2015-10-09 (×6): 0.4 mg via ORAL
  Filled 2015-10-03 (×6): qty 1

## 2015-10-03 MED ORDER — FENTANYL CITRATE (PF) 100 MCG/2ML IJ SOLN
50.0000 ug | Freq: Once | INTRAMUSCULAR | Status: AC
  Administered 2015-10-03: 50 ug via INTRAVENOUS

## 2015-10-03 MED ORDER — DEXTROSE 5 % IV SOLN
500.0000 mg | Freq: Every day | INTRAVENOUS | Status: DC
  Administered 2015-10-04 (×2): 500 mg via INTRAVENOUS
  Filled 2015-10-03 (×3): qty 500

## 2015-10-03 MED ORDER — DEXTROSE 5 % IV SOLN
1.0000 g | Freq: Once | INTRAVENOUS | Status: DC
  Administered 2015-10-03: 1 g via INTRAVENOUS
  Filled 2015-10-03: qty 10

## 2015-10-03 MED ORDER — ALLOPURINOL 300 MG PO TABS
300.0000 mg | ORAL_TABLET | Freq: Every day | ORAL | Status: DC
  Administered 2015-10-04 – 2015-10-08 (×5): 300 mg via ORAL
  Filled 2015-10-03 (×5): qty 1

## 2015-10-03 MED ORDER — ASPIRIN EC 81 MG PO TBEC
81.0000 mg | DELAYED_RELEASE_TABLET | Freq: Every day | ORAL | Status: DC
  Administered 2015-10-04 – 2015-10-09 (×6): 81 mg via ORAL
  Filled 2015-10-03 (×6): qty 1

## 2015-10-03 MED ORDER — VANCOMYCIN HCL IN DEXTROSE 1-5 GM/200ML-% IV SOLN
1000.0000 mg | INTRAVENOUS | Status: DC
  Administered 2015-10-05: 1000 mg via INTRAVENOUS
  Filled 2015-10-03: qty 200

## 2015-10-03 MED ORDER — ATORVASTATIN CALCIUM 40 MG PO TABS
40.0000 mg | ORAL_TABLET | Freq: Every day | ORAL | Status: DC
  Administered 2015-10-04 – 2015-10-09 (×6): 40 mg via ORAL
  Filled 2015-10-03 (×6): qty 1

## 2015-10-03 MED ORDER — DM-GUAIFENESIN ER 30-600 MG PO TB12
1.0000 | ORAL_TABLET | Freq: Two times a day (BID) | ORAL | Status: DC
  Administered 2015-10-03 – 2015-10-09 (×13): 1 via ORAL
  Filled 2015-10-03 (×13): qty 1

## 2015-10-03 MED ORDER — FAMOTIDINE 20 MG PO TABS
20.0000 mg | ORAL_TABLET | Freq: Two times a day (BID) | ORAL | Status: DC
  Administered 2015-10-03 – 2015-10-05 (×4): 20 mg via ORAL
  Filled 2015-10-03 (×4): qty 1

## 2015-10-03 MED ORDER — NITROGLYCERIN 0.4 MG SL SUBL: 0.4000 mg | SUBLINGUAL_TABLET | SUBLINGUAL | Status: DC | PRN

## 2015-10-03 MED ORDER — IPRATROPIUM-ALBUTEROL 0.5-2.5 (3) MG/3ML IN SOLN
3.0000 mL | Freq: Two times a day (BID) | RESPIRATORY_TRACT | Status: DC
  Administered 2015-10-04: 3 mL via RESPIRATORY_TRACT
  Filled 2015-10-03: qty 3

## 2015-10-03 MED ORDER — CARVEDILOL 3.125 MG PO TABS
3.1250 mg | ORAL_TABLET | Freq: Two times a day (BID) | ORAL | Status: DC
  Administered 2015-10-04 – 2015-10-10 (×13): 3.125 mg via ORAL
  Filled 2015-10-03 (×14): qty 1

## 2015-10-03 MED ORDER — RIVAROXABAN 15 MG PO TABS
15.0000 mg | ORAL_TABLET | Freq: Every day | ORAL | Status: DC
  Administered 2015-10-04 – 2015-10-10 (×7): 15 mg via ORAL
  Filled 2015-10-03 (×8): qty 1

## 2015-10-03 MED ORDER — FENTANYL CITRATE (PF) 100 MCG/2ML IJ SOLN
INTRAMUSCULAR | Status: AC
  Filled 2015-10-03: qty 2

## 2015-10-03 MED ORDER — ISOSORBIDE MONONITRATE ER 30 MG PO TB24
30.0000 mg | ORAL_TABLET | Freq: Every day | ORAL | Status: DC
  Administered 2015-10-04 – 2015-10-09 (×6): 30 mg via ORAL
  Filled 2015-10-03 (×6): qty 1

## 2015-10-03 MED ORDER — DEXTROSE 5 % IV SOLN
500.0000 mg | Freq: Once | INTRAVENOUS | Status: DC
  Filled 2015-10-03: qty 500

## 2015-10-03 MED ORDER — SODIUM CHLORIDE 0.9 % IV BOLUS (SEPSIS): 1000.0000 mL | Freq: Once | INTRAVENOUS | Status: DC

## 2015-10-03 MED ORDER — DEXTROSE 5 % IV SOLN
1.0000 g | INTRAVENOUS | Status: DC
  Administered 2015-10-04 – 2015-10-06 (×3): 1 g via INTRAVENOUS
  Filled 2015-10-03 (×4): qty 1

## 2015-10-03 MED ORDER — VANCOMYCIN HCL 10 G IV SOLR
1250.0000 mg | Freq: Once | INTRAVENOUS | Status: AC
  Administered 2015-10-03: 1250 mg via INTRAVENOUS
  Filled 2015-10-03: qty 1250

## 2015-10-03 MED ORDER — DEXTROSE 5 % IV SOLN
2.0000 g | Freq: Once | INTRAVENOUS | Status: AC
  Administered 2015-10-03: 2 g via INTRAVENOUS
  Filled 2015-10-03: qty 2

## 2015-10-03 MED ORDER — MORPHINE SULFATE (PF) 2 MG/ML IV SOLN
1.0000 mg | INTRAVENOUS | Status: DC | PRN
  Administered 2015-10-04 (×2): 1 mg via INTRAVENOUS
  Filled 2015-10-03 (×3): qty 1

## 2015-10-03 MED ORDER — SODIUM CHLORIDE 0.9 % IV SOLN
1000.0000 mL | INTRAVENOUS | Status: DC
  Administered 2015-10-03 – 2015-10-05 (×3): 1000 mL via INTRAVENOUS

## 2015-10-03 NOTE — ED Notes (Signed)
Patient transported to X-ray 

## 2015-10-03 NOTE — H&P (Addendum)
History and Physical    [name] UJW:[id number] DOB: 06/23/1926 DOA: 10/03/2015  Referring MD/NP/PA:   PCP: Pamelia HoitWILSON,FRED HENRY, MD   Patient coming from:  The patient is coming from home.  At baseline, pt is partially dependent for his ADL.   Chief Complaint: Cough, shortness of breath, fever, chest pain, nausea, vomiting, diarrhea, abdominal pain  HPI: Jonathon Spicehaeng Hilburn is a 82 y.o. male with medical history significant of hypertension, hyperlipidemia, gout, depression, CAD, PUD, BPH, chronic confined systolic and diastolic CHF, carotid artery stenosis, AAA, anemia, PAF on Xarelto, who presents with cough, shortness of breath, fever and chest pain.  Patient does not speak [place], he speaks [place] language. Per his son, patient has been having cough, fever, shortness of breath, chest pain for 3 days. He coughs up yellow colored sputum. His chest pain is located in the left side, constant, moderate, sharp, pleuritic. It is aggravated by coughing and deep breath. No tenderness over calf areas.  Patient also has nausea, vomiting, diarrhea, abdominal pain in the past 3 days. He vomited twice without blood in the vomitus. He had 3 bowel movements with loose stool. His abdominal pain is located in the left middle quadrant, constant, moderate, nonradiating. It is not aggravated or herniated any known factors. Patient does not have symptoms of UTI or unilateral weakness.  ED Course: pt was found to have an elevated lactate at 2.46, WBC 27.6, troponin 0.72, BNP 156, lipase 23, negative urinalysis, negative C. difficile PCR, worsening renal function. Chest x-ray showed infiltration in left upper lobe. Patient is admitted to telemetry bed as inpatient for further infection treatment.  Review of Systems:   General: has fevers, chills, no changes in body weight, has poor appetite, has fatigue HEENT: no blurry vision, hearing changes or sore throat Respiratory: has dyspnea, coughing, no  wheezing CV: has chest pain, no palpitations GI: has nausea, vomiting, abdominal pain, diarrhea, no constipation GU: no dysuria, burning on urination, increased urinary frequency, hematuria  Ext: no leg edema Neuro: no unilateral weakness, numbness, or tingling, no vision change or hearing loss Skin: no rash MSK: No muscle spasm, no deformity, no limitation of range of movement in spin Heme: No easy bruising.  Travel history: No recent long distant travel.  Allergy:  Allergies  Allergen Reactions  . Levofloxacin Anaphylaxis and Other (See Comments)    Patient told his daughter that it made him "feel worse than he already did" (overall) Headache also (has refused to take anymore)  . Eliquis [Apixaban] Itching and Swelling  . Phenazopyridine Hcl Other (See Comments)    Unknown reaction  . Septra [Sulfamethoxazole-Trimethoprim] Nausea And Vomiting    GI Upset  . Latex Rash  . Tape Rash    Past Medical History:  Diagnosis Date  . AAA (abdominal aortic aneurysm) (HCC)   . Anemia   . Asthma   . Carotid artery disease (HCC)   . CHF (congestive heart failure) (HCC)   . Coronary artery disease   . Depression   . Gout   . H. pylori infection   . Hard of hearing   . Hiatal hernia   . Hyperplasia, prostate   . Hypertension   . PUD (peptic ulcer disease)   . Renal failure, acute (HCC) 11/08/2012    Past Surgical History:  Procedure Laterality Date  . CAROTID STENT INSERTION  2015  . CAROTID STENT INSERTION N/A 05/09/2013   Procedure: CAROTID STENT INSERTION;  Surgeon: Runell GessJonathan J Berry, MD;  [facility];  Service: Cardiovascular;  Laterality: N/A;  . ESOPHAGOGASTRODUODENOSCOPY  06/13/2008,12/31/12  . LEFT HEART CATHETERIZATION WITH CORONARY ANGIOGRAM N/A 05/04/2013   Procedure: LEFT HEART CATHETERIZATION WITH CORONARY ANGIOGRAM;  Surgeon: Peter M Swaziland, MD;  Location: [facility] CATH LAB;  Service: Cardiovascular;  Laterality: N/A;    Social History:  reports that he quit  smoking about 13 years ago. He has never used smokeless tobacco. He reports that he does not drink alcohol or use drugs.  Family History:  Family History  Problem Relation Age of Onset  . Colon cancer Neg Hx      Prior to Admission medications   Medication Sig Start Date End Date Taking? Authorizing Provider  acetaminophen (TYLENOL) 325 MG tablet Take 325-650 mg by mouth every 6 (six) hours as needed for fever.   Yes Historical Provider, MD  allopurinol (ZYLOPRIM) 300 MG tablet Take 300 mg by mouth daily. To prevent gout   Yes Historical Provider, MD  aspirin EC 81 MG tablet Take 1 tablet (81 mg total) by mouth daily. 09/08/13  Yes Marvel Plan, MD  atorvastatin (LIPITOR) 40 MG tablet Take 1 tablet (40 mg total) by mouth daily at 6 PM. 05/11/13  Yes Brittainy Sherlynn Carbon, PA-C  carvedilol (COREG) 3.125 MG tablet Take 1 tablet (3.125 mg total) by mouth 2 (two) times daily with a meal. 06/19/14  Yes Vassie Loll, MD  esomeprazole (NEXIUM) 40 MG capsule Take 1 capsule (40 mg total) by mouth daily. 07/31/15  Yes Margarita Grizzle, MD  ipratropium-albuterol (DUONEB) 0.5-2.5 (3) MG/3ML SOLN Take 3 mLs by nebulization 2 (two) times daily. May use 1 additional dose during the day if needed   Yes Historical Provider, MD  isosorbide mononitrate (IMDUR) 30 MG 24 hr tablet Take 30 mg by mouth once daily Patient taking differently: Take 30 mg by mouth daily. For control of heart pain 07/31/15  Yes Margarita Grizzle, MD  nitroGLYCERIN (NITROSTAT) 0.4 MG SL tablet Place 1 tablet (0.4 mg total) under the tongue every 5 (five) minutes x 3 doses as needed for chest pain. 05/11/13  Yes Brittainy Sherlynn Carbon, PA-C  Rivaroxaban (XARELTO) 15 MG TABS tablet Take 15 mg by mouth daily. To prevent strokes   Yes Historical Provider, MD  tamsulosin (FLOMAX) 0.4 MG CAPS capsule Take 0.4 mg by mouth daily. To improve bladder function   Yes Historical Provider, MD    Physical Exam: Vitals:   10/03/15 2219 10/03/15 2258 10/04/15 0504 10/04/15  0735  BP: 120/84 (!) 146/77 (!) 155/78 (!) 158/89  Pulse: (!) 124 (!) 114 (!) 110 (!) 117  Resp: 24 (!) 24 (!) 22 20  Temp: 99.4 F (37.4 C) 99.6 F (37.6 C) 99.1 F (37.3 C) (!) 101.6 F (38.7 C)  TempSrc: Oral Oral Oral Oral  SpO2: 100% 100% 99% 99%  Weight:  66.2 kg (146 lb) 67.3 kg (148 lb 4.8 oz)   Height:  5\' 2"  (1.575 m)     General: Not in acute distress HEENT:       Eyes: PERRL, EOMI, no scleral icterus.       ENT: No discharge from the ears and nose, no pharynx injection, no tonsillar enlargement.        Neck: No JVD, no bruit, no mass felt. Heme: No neck lymph node enlargement. Cardiac: S1/S2, RRR, No murmurs, No gallops or rubs. Respiratory: has rales bilaterally. No rales, wheezing, rhonchi or rubs. GI: Soft, nondistended, has tenderness over LMQ, no rebound pain, no organomegaly, BS present. GU: No hematuria  Ext: No pitting leg edema bilaterally. 2+DP/PT pulse bilaterally. Musculoskeletal: No joint deformities, No joint redness or warmth, no limitation of ROM in spin. Skin: No rashes.  Neuro: Alert, oriented X3, cranial nerves II-XII grossly intact, moves all extremities normally. Psych: Patient is not psychotic, no suicidal or hemocidal ideation.  Labs on Admission: I have personally reviewed following labs and imaging studies  CBC:  Recent Labs Lab 10/03/15 1744 10/03/15 2137  WBC 27.6*  --   NEUTROABS  --  23.7*  HGB 10.8*  --   HCT 32.5*  --   MCV 91.3  --   PLT 194  --    Basic Metabolic Panel:  Recent Labs Lab 10/03/15 1744  NA 128*  K 3.9  CL 99*  CO2 19*  GLUCOSE 118*  BUN 26*  CREATININE 3.00*  CALCIUM 8.8*   GFR: Estimated Creatinine Clearance: 14.6 mL/min (by C-G formula based on SCr of 3 mg/dL). Liver Function Tests: No results for input(s): AST, ALT, ALKPHOS, BILITOT, PROT, ALBUMIN in the last 168 hours.  Recent Labs Lab 10/03/15 2316  LIPASE 23   No results for input(s): AMMONIA in the last 168 hours. Coagulation  Profile:  Recent Labs Lab 10/03/15 2316  INR 1.74   Cardiac Enzymes:  Recent Labs Lab 10/03/15 2316 10/04/15 0507  TROPONINI 0.62* 0.47*   BNP (last 3 results) No results for input(s): PROBNP in the last 8760 hours. HbA1C: No results for input(s): HGBA1C in the last 72 hours. CBG: No results for input(s): GLUCAP in the last 168 hours. Lipid Profile:  Recent Labs  10/04/15 0507  CHOL 84  HDL 33*  LDLCALC 35  TRIG 82  CHOLHDL 2.5   Thyroid Function Tests: No results for input(s): TSH, T4TOTAL, FREET4, T3FREE, THYROIDAB in the last 72 hours. Anemia Panel: No results for input(s): VITAMINB12, FOLATE, FERRITIN, TIBC, IRON, RETICCTPCT in the last 72 hours. Urine analysis:    Component Value Date/Time   COLORURINE AMBER (A) 10/03/2015 2045   APPEARANCEUR CLEAR 10/03/2015 2045   LABSPEC 1.021 10/03/2015 2045   PHURINE 6.0 10/03/2015 2045   GLUCOSEU NEGATIVE 10/03/2015 2045   HGBUR SMALL (A) 10/03/2015 2045   BILIRUBINUR SMALL (A) 10/03/2015 2045   KETONESUR 15 (A) 10/03/2015 2045   PROTEINUR 100 (A) 10/03/2015 2045   UROBILINOGEN 0.2 07/07/2014 0604   NITRITE NEGATIVE 10/03/2015 2045   LEUKOCYTESUR NEGATIVE 10/03/2015 2045   Sepsis Labs: @LABRCNTIP (procalcitonin:4,lacticidven:4) ) Recent Results (from the past 240 hour(s))  C difficile quick screen w PCR reflex     Status: None   Collection Time: 10/04/15  1:53 AM  Result Value Ref Range Status   C Diff antigen NEGATIVE NEGATIVE Final   C Diff toxin NEGATIVE NEGATIVE Final   C Diff interpretation No C. difficile detected.  Final     Radiological Exams on Admission: Dg Chest 2 View  Result Date: 10/03/2015 CLINICAL DATA:  Left-sided chest pain, shortness of breath and fever. EXAM: CHEST  2 VIEW COMPARISON:  Chest x-ray and chest CT studies on 07/31/2015 FINDINGS: Interval development of new left upper lobe infiltrate. No associated edema or pleural fluid. Mild atelectasis present at both lung bases. Stable  mild cardiac enlargement. Bony structures show stable spondylosis of the thoracic spine. IMPRESSION: New left upper lobe pneumonia. Electronically Signed   By: Irish Lack M.D.   On: 10/03/2015 18:43     EKG: Independently reviewed. Sinus rhythm, poor quality strip, QTC 438, poor R-wave progression, LAD, nonspecific T-wave change  Assessment/Plan  Principal Problem:   HCAP (healthcare-associated pneumonia) Active Problems:   Hypertension   Atrial fibrillation- unknown duration   Acute renal failure superimposed on stage 3 chronic kidney disease (HCC)   Hyperlipidemia   Coronary artery disease   Chronic anticoagulation   Carotid artery disease (HCC)   Sepsis (HCC)   Elevated troponin   Nausea, vomiting and diarrhea   Abdominal pain   HCAP and sepsis: Patient's pleuritic chest pain, fever, shortness of breath, productive cough, breath test x-ray findings of infiltration in left upper lobe, consistent with HCAP. Pt is septic with leukocytosis, elevated lactate, tachycardia, tachypnea. Currently hemodynamically stable. - Will admit to telemetry bed as inpt - IV Vancomycin and cefepime, Zosyn Aztreonam.  - Mucinex for cough  - Atrovent nebsfor SOB - Urine legionella and S. pneumococcal antigen - Follow up blood culture x2, sputum culture and respiratory virus panel - will get Procalcitonin and trend lactic acid level per sepsis protocol - IVF: NS at 125 mL per hour of NS (patient has CHF with EF 40%, limiting aggressive IV fluids treatment)  HTN: Blood pressure 157/84 -Continue Coreg -also on Flomax for BPh  BPH: stable - Continue Flomax  Atrial Fibrillation: CHA2DS2-VASc Score is 6, needs oral anticoagulation. Patient is Xarelto at home. Heart rate is at 110s. -continue coreg -continue Xarelto  AoCKD-III: Baseline Cre is 1.8-1.9, pt's Cre is 3.0, BUN 26 on admission. Likely due to prerenal failure. - IVF as above - Check FeNa  - Follow up renal function by BMP  HLD:  Last LDL was 35 on 10/04/15 -Continue home medications: Lipitor  CAD and elevated trop: Troponin 0.72. Patient has chest pain, which is pleuritic, likely due to pneumonia. Elevated troponin is likely due to demanding ischemia secondary to sepsis. - cycle CE q6 x3 and repeat her EKG in the am  - Nitroglycerin, Morphine, and aspirin, lipitor, Imdur - Risk factor stratification: will check FLP and A1C  - 2d echo  Nausea, vomiting and diarrhea and Abdominal pain: Etiology is not clear. Lipase 23. C diff pcr are negative. Physical examination of abdomen is benign, no acute abdomen. May be due to viral gastroenteritis. Sepsis may have also contributed partially -Follow-up Gi path panel -IV fluid as above -Zofran for nausea and morphine for pain  GERD: -Pepcid IV  DVT ppx: on Xarelto Code Status: Full code Family Communication:  Yes, patient's son at bed side Disposition Plan:  Anticipate discharge back to previous home environment Consults called:  none Admission status: Inpatient/tele   Date of Service 10/04/2015    Lorretta Harp [facility] Pager [phone]  If 7PM-7AM, please contact night-coverage [email or web address] Password [facility] 10/04/2015, 7:47 AM

## 2015-10-03 NOTE — ED Triage Notes (Signed)
Pt here with cp for several days on left side. Feels SOB. Daughter reports he has been running fever for a few days. Feels nauseated, no v/d. Pt appears sob, HR 117.

## 2015-10-03 NOTE — Progress Notes (Signed)
Pharmacy Antibiotic Note  [name] is a 82 y.o. male admitted on 10/03/2015 with pneumonia and sepsis.  Currently afebrile with elevated WBC andh HR 110. BP wnl, Cr 3, CrCl ~17. Pharmacy has been consulted for cefepime and vancomycin dosing.   Plan: Vancomycin 1250 mg IV LD + 1000 mg IV every 48 hours for goal trough of 15-20. Cefepime 2g LD + 1 gram IV every 24 hours Monitor renal function, wbc, tmax, and cultures    Temp (24hrs), Avg:98 F (36.7 C), Min:98 F (36.7 C), Max:98 F (36.7 C)   Recent Labs Lab 10/03/15 1744  WBC 27.6*  CREATININE 3.00*    CrCl cannot be calculated (Unknown ideal weight.).    Allergies  Allergen Reactions  . Levofloxacin Anaphylaxis and Other (See Comments)    Patient told his daughter that it made him "feel worse than he already did" (overall) Headache also (has refused to take anymore)  . Eliquis [Apixaban] Itching and Swelling  . Phenazopyridine Hcl Other (See Comments)    Unknown reaction  . Septra [Sulfamethoxazole-Trimethoprim] Nausea And Vomiting    GI Upset    Antimicrobials this admission: Vancomycin 8/23 >>  Cefepime 8/23 >>   Dose adjustments this admission:   Microbiology results: 8/23 BCx:  8/23 UCx:     Thank you for allowing pharmacy to be a part of this patient's care.  Alfredo Bach, Cleotis Nipper, PharmD Clinical Pharmacy Resident [phone] (Pager) 10/03/2015 7:15 PM

## 2015-10-03 NOTE — ED Notes (Signed)
Pt returned from x-ray.  Daughter at bedside,

## 2015-10-03 NOTE — ED Notes (Signed)
MD at bedside. 

## 2015-10-03 NOTE — ED Notes (Signed)
Pt st's pain is better after pain med.  Daughter remains at bedside

## 2015-10-03 NOTE — ED Notes (Signed)
Pt put on bed pan

## 2015-10-04 DIAGNOSIS — I10 Essential (primary) hypertension: Secondary | ICD-10-CM

## 2015-10-04 DIAGNOSIS — A040 Enteropathogenic Escherichia coli infection: Secondary | ICD-10-CM

## 2015-10-04 DIAGNOSIS — R197 Diarrhea, unspecified: Secondary | ICD-10-CM

## 2015-10-04 DIAGNOSIS — R112 Nausea with vomiting, unspecified: Secondary | ICD-10-CM

## 2015-10-04 LAB — GASTROINTESTINAL PANEL BY PCR, STOOL (REPLACES STOOL CULTURE)
Adenovirus F40/41: NOT DETECTED
Astrovirus: NOT DETECTED
CRYPTOSPORIDIUM: NOT DETECTED
CYCLOSPORA CAYETANENSIS: NOT DETECTED
Campylobacter species: NOT DETECTED
E. coli O157: NOT DETECTED
ENTAMOEBA HISTOLYTICA: NOT DETECTED
ENTEROAGGREGATIVE E COLI (EAEC): NOT DETECTED
Enteropathogenic E coli (EPEC): DETECTED — AB
Enterotoxigenic E coli (ETEC): NOT DETECTED
GIARDIA LAMBLIA: NOT DETECTED
Norovirus GI/GII: NOT DETECTED
Plesimonas shigelloides: NOT DETECTED
Rotavirus A: NOT DETECTED
SALMONELLA SPECIES: NOT DETECTED
SHIGELLA/ENTEROINVASIVE E COLI (EIEC): NOT DETECTED
Sapovirus (I, II, IV, and V): NOT DETECTED
Shiga like toxin producing E coli (STEC): NOT DETECTED
VIBRIO CHOLERAE: NOT DETECTED
Vibrio species: NOT DETECTED
YERSINIA ENTEROCOLITICA: NOT DETECTED

## 2015-10-04 LAB — HIV ANTIBODY (ROUTINE TESTING W REFLEX): HIV SCREEN 4TH GENERATION: NONREACTIVE

## 2015-10-04 LAB — C DIFFICILE QUICK SCREEN W PCR REFLEX
C Diff antigen: NEGATIVE
C Diff interpretation: NOT DETECTED
C Diff toxin: NEGATIVE

## 2015-10-04 LAB — RESPIRATORY PANEL BY PCR
ADENOVIRUS-RVPPCR: NOT DETECTED
Bordetella pertussis: NOT DETECTED
CORONAVIRUS 229E-RVPPCR: NOT DETECTED
CORONAVIRUS OC43-RVPPCR: NOT DETECTED
Chlamydophila pneumoniae: NOT DETECTED
Coronavirus HKU1: NOT DETECTED
Coronavirus NL63: NOT DETECTED
INFLUENZA B-RVPPCR: NOT DETECTED
Influenza A: NOT DETECTED
MYCOPLASMA PNEUMONIAE-RVPPCR: NOT DETECTED
Metapneumovirus: NOT DETECTED
PARAINFLUENZA VIRUS 1-RVPPCR: NOT DETECTED
Parainfluenza Virus 2: NOT DETECTED
Parainfluenza Virus 3: NOT DETECTED
Parainfluenza Virus 4: NOT DETECTED
RESPIRATORY SYNCYTIAL VIRUS-RVPPCR: NOT DETECTED
Rhinovirus / Enterovirus: NOT DETECTED

## 2015-10-04 LAB — TROPONIN I
TROPONIN I: 0.62 ng/mL — AB (ref <0.03)
Troponin I: 0.47 ng/mL (ref <0.03)
Troponin I: 0.35 ng/mL (ref <0.03)

## 2015-10-04 LAB — STREP PNEUMONIAE URINARY ANTIGEN: STREP PNEUMO URINARY ANTIGEN: NEGATIVE

## 2015-10-04 LAB — LIPID PANEL
CHOL/HDL RATIO: 2.5 ratio
Cholesterol: 84 mg/dL (ref 0–200)
HDL: 33 mg/dL — AB (ref >40)
LDL Cholesterol: 35 mg/dL (ref 0–99)
Triglycerides: 82 mg/dL (ref <150)
VLDL: 16 mg/dL (ref 0–40)

## 2015-10-04 LAB — PROCALCITONIN: PROCALCITONIN: 68.67 ng/mL

## 2015-10-04 LAB — CREATININE, URINE, RANDOM: CREATININE, URINE: 173 mg/dL

## 2015-10-04 LAB — LACTIC ACID, PLASMA
Lactic Acid, Venous: 1.8 mmol/L (ref 0.5–1.9)
Lactic Acid, Venous: 1.5 mmol/L (ref 0.5–1.9)

## 2015-10-04 LAB — BRAIN NATRIURETIC PEPTIDE: B Natriuretic Peptide: 156.1 pg/mL — ABNORMAL HIGH (ref 0.0–100.0)

## 2015-10-04 LAB — SODIUM, URINE, RANDOM: Sodium, Ur: 47 mmol/L

## 2015-10-04 LAB — LIPASE, BLOOD: LIPASE: 23 U/L (ref 11–51)

## 2015-10-04 MED ORDER — IPRATROPIUM-ALBUTEROL 0.5-2.5 (3) MG/3ML IN SOLN
3.0000 mL | Freq: Three times a day (TID) | RESPIRATORY_TRACT | Status: DC
  Administered 2015-10-04 (×2): 3 mL via RESPIRATORY_TRACT
  Filled 2015-10-04 (×2): qty 3

## 2015-10-04 MED ORDER — IPRATROPIUM-ALBUTEROL 0.5-2.5 (3) MG/3ML IN SOLN
3.0000 mL | Freq: Three times a day (TID) | RESPIRATORY_TRACT | Status: DC
  Administered 2015-10-05: 3 mL via RESPIRATORY_TRACT
  Filled 2015-10-04 (×2): qty 3

## 2015-10-04 MED ORDER — ACETAMINOPHEN 325 MG PO TABS
650.0000 mg | ORAL_TABLET | Freq: Four times a day (QID) | ORAL | Status: DC | PRN
  Administered 2015-10-04 (×2): 650 mg via ORAL
  Filled 2015-10-04 (×2): qty 2

## 2015-10-04 MED ORDER — SACCHAROMYCES BOULARDII 250 MG PO CAPS
250.0000 mg | ORAL_CAPSULE | Freq: Two times a day (BID) | ORAL | Status: DC
  Administered 2015-10-04 – 2015-10-09 (×11): 250 mg via ORAL
  Filled 2015-10-04 (×11): qty 1

## 2015-10-04 MED ORDER — ONDANSETRON HCL 4 MG/2ML IJ SOLN: 4.0000 mg | Freq: Three times a day (TID) | INTRAMUSCULAR | Status: DC | PRN

## 2015-10-04 MED ORDER — BUDESONIDE 0.25 MG/2ML IN SUSP
0.2500 mg | Freq: Two times a day (BID) | RESPIRATORY_TRACT | Status: DC
  Administered 2015-10-04: 0.25 mg via RESPIRATORY_TRACT
  Filled 2015-10-04 (×2): qty 2

## 2015-10-04 NOTE — Progress Notes (Signed)
CRITICAL VALUE ALERT  Critical value received:  Enterogenic e coli  Date of notification:  10/04/15  Time of notification0530  Critical value read bac yes  Nurse who received alert:  Clarene Reamer Perris Tripathi  MD notified (1st page): Gwenlyn Perking md  Time of first page:  1758  MD notified (2nd page):  Time of second page:  Responding MD: Gwenlyn Perking  Time MD responded:  [phone]

## 2015-10-04 NOTE — Progress Notes (Signed)
OT Cancellation Note  Patient Details Name: [name] MRN: [id number] DOB: 01/04/1927   Cancelled Treatment:    Reason Eval/Treat Not Completed: Medical issues which prohibited therapy;Other (comment) (High lactic acid, elevated troponins, and fever). Will reattempt OT eval once pt more stable.   Pilar Grammes 10/04/2015, 11:25 AM

## 2015-10-04 NOTE — Progress Notes (Signed)
PT Cancellation Note  Patient Details Name: [name] MRN: [id number] DOB: 04-23-1926   Cancelled Treatment:    Reason Eval/Treat Not Completed: Medical issues which prohibited therapy.  High lactic acid, elevated troponins, fever and will recheck later to see if pt is more stable.   Ivar Drape 10/04/2015, 11:20 AM    Samul Dada, PT MS Acute Rehab Dept. Number: [facility] [id number] and [facility] [phone]

## 2015-10-04 NOTE — Progress Notes (Signed)
1820 Dr Gwenlyn Perking aware of Gi panel    No orders . Been on antibiotic

## 2015-10-04 NOTE — Progress Notes (Signed)
0800 . Pt febrile 101.1 Made Dr aware. Pt encouraged high fowlers .deep breathing and coughing encouraged thru family for interpretation. .Compliant

## 2015-10-04 NOTE — Progress Notes (Signed)
[facility] PROGRESS NOTE  [name] WUJ:[id number] DOB: 01/07/1927 DOA: 10/03/2015 PCP: Pamelia HoitWILSON,FRED HENRY, MD  Interim summary and HPI 82 y.o. male with medical history significant of hypertension, hyperlipidemia, gout, depression, CAD, PUD, BPH, chronic confined systolic and diastolic CHF, carotid artery stenosis, AAA, anemia, PAF on Xarelto, who presents with cough, shortness of breath, fever and chest pain. Patient does not speak [place], he speaks [place] language. Per his son, patient has been having cough, fever, shortness of breath, chest pain for 3 days. He coughs up yellow colored sputum. His chest pain is located in the left side, constant, moderate, sharp, pleuritic. It is aggravated by coughing and deep breath. No tenderness over calf areas.  Patient also has nausea, vomiting, diarrhea, abdominal pain in the past 3 days. He vomited twice without blood in the vomitus. He had 3 bowel movements with loose stool.  Found to have an elevated lactate at 2.46, WBC 27.6, troponin 0.72, BNP 156, lipase 23, negative urinalysis, negative C. difficile PCR, worsening renal function. Chest x-ray showed infiltration in left upper lobe. Patient is admitted for further treatment of his sepsis due to HCAP.   Assessment/Plan: HCAP and sepsis: Patient's pleuritic chest pain, fever, shortness of breath, productive cough, breath test x-ray findings of infiltration in left upper lobe, consistent with HCAP. Pt is septic with leukocytosis, elevated lactate, tachycardia, tachypnea. Currently hemodynamically stable. - Will continue current antibiotic therapy -follow culture data and clinical response -start pulmicort and flutter valve -PRN oxygen supplementation  -PRN analgesics for pleuritic pain  HTN: Blood pressure 157/84 -Continue Coreg -also on Flomax   BPH: stable - Continue Flomax -no complaints of urinary retention   Atrial Fibrillation: CHA2DS2-VASc Score is 6, needs oral  anticoagulation. Patient is Xarelto at home. Heart rate is at 110s. -continue coreg -continue Xarelto (dose to be adjusted for renal function as needed)  Acute on chronic renal failure stage III: Baseline Cre is 1.8-1.9, pt's Cre is 3.0, BUN 26 on admission. Likely due to prerenal failure. -continue IVF as above -Follow up renal function by BMP -minimize nephrotoxic agents  HLD: Last LDL was 35 on 10/04/15 -Continue Lipitor  CAD and elevated trop: Troponin 0.72. Patient has chest pain, which is pruritic, likely due to pneumonia. Elevated troponin is likely due to demanding ischemia secondary to sepsis. - repeated EKG w/o acute ischemic changes - will continue Nitroglycerin, Morphine, and aspirin, lipitor, Imdur - follow 2-D echo  Nausea, vomiting and diarrhea and Abdominal pain: Lipase 23. C diff pcr are negative. Physical examination of abdomen is benign, no acute abdomen. -GI panel positive for E. Coli enteropathogenic  -will continue supportive care -start probiotics -patient drank some herbal laxatives tea 2-3 days prior to admission according to family  -continue PRN antiemetics   GERD: -continue Pepcid IV  Code Status: Full Family Communication: daughter at bedside  Disposition Plan: continue current antibiotic therapy, initiate Pulmicort and flutter valve; patient denies vomiting, but having some nausea; will advance diet to full liquid.   Consultants:  None  Procedures:  See below for x-ray   Antibiotics:  Vancomycin zithromax and cefepime 10/03/15  HPI/Subjective: Still spiking fever, with pleuritic CP and complaining of SOB. Positive wheezing and rhonchi and exam.  Objective: Vitals:   10/04/15 0735 10/04/15 0939  BP: (!) 158/89 (!) 151/88  Pulse: (!) 117 (!) 123  Resp: 20   Temp: (!) 101.6 F (38.7 C)     Intake/Output Summary (Last 24 hours) at 10/04/15 0945 Last data filed at 10/04/15  0840  Gross per 24 hour  Intake          1108.33 ml   Output              200 ml  Net           908.33 ml   Filed Weights   10/03/15 2258 10/04/15 0504  Weight: 66.2 kg (146 lb) 67.3 kg (148 lb 4.8 oz)    Exam:   General:  Spiking fever, complaining of pleuritic CP, patient with wheezing on exam.  Cardiovascular: positive SEM, no rubs, no gallops, S1 and S2 appreciated  Respiratory: positive wheezing, no using accessory muscles, scattered rhonchi appreciated  Abdomen: soft, NT, ND, positive BS  Musculoskeletal: no edema or cyanosis   Data Reviewed: Basic Metabolic Panel:  Recent Labs Lab 10/03/15 1744  NA 128*  K 3.9  CL 99*  CO2 19*  GLUCOSE 118*  BUN 26*  CREATININE 3.00*  CALCIUM 8.8*    Recent Labs Lab 10/03/15 2316  LIPASE 23   CBC:  Recent Labs Lab 10/03/15 1744 10/03/15 2137  WBC 27.6*  --   NEUTROABS  --  23.7*  HGB 10.8*  --   HCT 32.5*  --   MCV 91.3  --   PLT 194  --    Cardiac Enzymes:  Recent Labs Lab 10/03/15 2316 10/04/15 0507  TROPONINI 0.62* 0.47*   BNP (last 3 results)  Recent Labs  07/27/15 0609 07/31/15 1545 10/03/15 2316  BNP 71.2 39.0 156.1*   CBG: No results for input(s): GLUCAP in the last 168 hours.  Recent Results (from the past 240 hour(s))  C difficile quick screen w PCR reflex     Status: None   Collection Time: 10/04/15  1:53 AM  Result Value Ref Range Status   C Diff antigen NEGATIVE NEGATIVE Final   C Diff toxin NEGATIVE NEGATIVE Final   C Diff interpretation No C. difficile detected.  Final     Studies: Dg Chest 2 View  Result Date: 10/03/2015 CLINICAL DATA:  Left-sided chest pain, shortness of breath and fever. EXAM: CHEST  2 VIEW COMPARISON:  Chest x-ray and chest CT studies on 07/31/2015 FINDINGS: Interval development of new left upper lobe infiltrate. No associated edema or pleural fluid. Mild atelectasis present at both lung bases. Stable mild cardiac enlargement. Bony structures show stable spondylosis of the thoracic spine. IMPRESSION: New  left upper lobe pneumonia. Electronically Signed   By: Irish Lack M.D.   On: 10/03/2015 18:43    Scheduled Meds: . allopurinol  300 mg Oral Daily  . aspirin EC  81 mg Oral Daily  . atorvastatin  40 mg Oral q1800  . azithromycin  500 mg Intravenous QHS  . budesonide (PULMICORT) nebulizer solution  0.25 mg Nebulization BID  . carvedilol  3.125 mg Oral BID WC  . ceFEPime (MAXIPIME) IV  1 g Intravenous Q24H  . dextromethorphan-guaiFENesin  1 tablet Oral BID  . famotidine  20 mg Oral BID  . ipratropium-albuterol  3 mL Nebulization Q8H  . isosorbide mononitrate  30 mg Oral Daily  . Rivaroxaban  15 mg Oral Q breakfast  . tamsulosin  0.4 mg Oral Daily  . [START ON 10/05/2015] vancomycin  1,000 mg Intravenous Q48H   Continuous Infusions: . sodium chloride 1,000 mL (10/04/15 1191)    Principal Problem:   HCAP (healthcare-associated pneumonia) Active Problems:   Hypertension   Atrial fibrillation- unknown duration   Acute renal failure superimposed on stage 3  chronic kidney disease (HCC)   Hyperlipidemia   Coronary artery disease   Chronic anticoagulation   Carotid artery disease (HCC)   Sepsis (HCC)   Elevated troponin   Nausea, vomiting and diarrhea   Abdominal pain    Time spent: 30 minutes    Vassie Loll  [facility] Pager [phone]. If 7PM-7AM, please contact night-coverage at [email or web address], password [facility] 10/04/2015, 9:45 AM  LOS: 1 day

## 2015-10-04 NOTE — Progress Notes (Signed)
0700 Assisted to BR ,ambulating with one person assist .tolerated well

## 2015-10-05 ENCOUNTER — Inpatient Hospital Stay (HOSPITAL_COMMUNITY): Payer: Medicare Other

## 2015-10-05 DIAGNOSIS — A419 Sepsis, unspecified organism: Principal | ICD-10-CM

## 2015-10-05 DIAGNOSIS — R7989 Other specified abnormal findings of blood chemistry: Secondary | ICD-10-CM

## 2015-10-05 DIAGNOSIS — R6520 Severe sepsis without septic shock: Secondary | ICD-10-CM

## 2015-10-05 DIAGNOSIS — J9621 Acute and chronic respiratory failure with hypoxia: Secondary | ICD-10-CM

## 2015-10-05 DIAGNOSIS — J189 Pneumonia, unspecified organism: Secondary | ICD-10-CM

## 2015-10-05 DIAGNOSIS — A044 Other intestinal Escherichia coli infections: Secondary | ICD-10-CM

## 2015-10-05 DIAGNOSIS — J9620 Acute and chronic respiratory failure, unspecified whether with hypoxia or hypercapnia: Secondary | ICD-10-CM

## 2015-10-05 DIAGNOSIS — Z7901 Long term (current) use of anticoagulants: Secondary | ICD-10-CM

## 2015-10-05 DIAGNOSIS — I10 Essential (primary) hypertension: Secondary | ICD-10-CM

## 2015-10-05 DIAGNOSIS — Y95 Nosocomial condition: Secondary | ICD-10-CM

## 2015-10-05 LAB — COMPREHENSIVE METABOLIC PANEL
ALT: 12 U/L — ABNORMAL LOW (ref 17–63)
AST: 26 U/L (ref 15–41)
Albumin: 2.3 g/dL — ABNORMAL LOW (ref 3.5–5.0)
Alkaline Phosphatase: 81 U/L (ref 38–126)
Anion gap: 7 (ref 5–15)
BILIRUBIN TOTAL: 1.5 mg/dL — AB (ref 0.3–1.2)
BUN: 15 mg/dL (ref 6–20)
CO2: 17 mmol/L — ABNORMAL LOW (ref 22–32)
Calcium: 8.3 mg/dL — ABNORMAL LOW (ref 8.9–10.3)
Chloride: 104 mmol/L (ref 101–111)
Creatinine, Ser: 1.78 mg/dL — ABNORMAL HIGH (ref 0.61–1.24)
GFR, EST AFRICAN AMERICAN: 38 mL/min — AB (ref >60)
GFR, EST NON AFRICAN AMERICAN: 33 mL/min — AB (ref >60)
Glucose, Bld: 171 mg/dL — ABNORMAL HIGH (ref 65–99)
POTASSIUM: 3.7 mmol/L (ref 3.5–5.1)
Sodium: 128 mmol/L — ABNORMAL LOW (ref 135–145)
TOTAL PROTEIN: 5.9 g/dL — AB (ref 6.5–8.1)

## 2015-10-05 LAB — URINE CULTURE: CULTURE: NO GROWTH

## 2015-10-05 LAB — HEMOGLOBIN A1C
HEMOGLOBIN A1C: 6.9 % — AB (ref 4.8–5.6)
Mean Plasma Glucose: 151 mg/dL

## 2015-10-05 LAB — ECHOCARDIOGRAM COMPLETE
FS: 13 % — AB (ref 28–44)
Height: 62 in
IVS/LV PW RATIO, ED: 1.21
LA diam index: 2.88 cm/m2
LA vol index: 42.2 mL/m2
LASIZE: 49 mm
LAVOL: 71.7 mL
LAVOLA4C: 75.9 mL
LEFT ATRIUM END SYS DIAM: 49 mm
LVOT area: 4.15 cm2
LVOT diameter: 23 mm
Lateral S' vel: 12.3 cm/s
PW: 9.81 mm — AB (ref 0.6–1.1)
WEIGHTICAEL: 2414.4 [oz_av]

## 2015-10-05 MED ORDER — FAMOTIDINE 20 MG PO TABS
20.0000 mg | ORAL_TABLET | Freq: Every day | ORAL | Status: DC
  Administered 2015-10-06 – 2015-10-08 (×3): 20 mg via ORAL
  Filled 2015-10-05 (×3): qty 1

## 2015-10-05 MED ORDER — SODIUM CHLORIDE 0.9 % IV SOLN: 1000.0000 mL | INTRAVENOUS | Status: AC

## 2015-10-05 MED ORDER — PANTOPRAZOLE SODIUM 40 MG PO TBEC
40.0000 mg | DELAYED_RELEASE_TABLET | Freq: Two times a day (BID) | ORAL | Status: DC
  Administered 2015-10-05 – 2015-10-10 (×10): 40 mg via ORAL
  Filled 2015-10-05 (×10): qty 1

## 2015-10-05 MED ORDER — METHYLPREDNISOLONE SODIUM SUCC 40 MG IJ SOLR
40.0000 mg | Freq: Three times a day (TID) | INTRAMUSCULAR | Status: DC
  Administered 2015-10-05: 40 mg via INTRAVENOUS
  Filled 2015-10-05: qty 1

## 2015-10-05 MED ORDER — METHYLPREDNISOLONE SODIUM SUCC 125 MG IJ SOLR
80.0000 mg | Freq: Three times a day (TID) | INTRAMUSCULAR | Status: DC
  Administered 2015-10-05 – 2015-10-06 (×2): 80 mg via INTRAVENOUS
  Filled 2015-10-05 (×2): qty 2

## 2015-10-05 MED ORDER — IPRATROPIUM-ALBUTEROL 0.5-2.5 (3) MG/3ML IN SOLN
3.0000 mL | Freq: Four times a day (QID) | RESPIRATORY_TRACT | Status: DC
  Administered 2015-10-05 – 2015-10-06 (×5): 3 mL via RESPIRATORY_TRACT
  Filled 2015-10-05 (×6): qty 3

## 2015-10-05 MED ORDER — AZITHROMYCIN 500 MG PO TABS
500.0000 mg | ORAL_TABLET | Freq: Every day | ORAL | Status: DC
  Administered 2015-10-05 – 2015-10-09 (×5): 500 mg via ORAL
  Filled 2015-10-05 (×5): qty 1

## 2015-10-05 MED ORDER — BUDESONIDE 0.5 MG/2ML IN SUSP
0.5000 mg | Freq: Two times a day (BID) | RESPIRATORY_TRACT | Status: DC
  Administered 2015-10-05 – 2015-10-10 (×10): 0.5 mg via RESPIRATORY_TRACT
  Filled 2015-10-05 (×11): qty 2

## 2015-10-05 NOTE — Progress Notes (Signed)
1420 Responded to bed alarm . Found pt oob to bedside holding urinal  Trying to void . Pt  Being held for support pt sat down to floor to urinate . Placed back to bed thereafter. Pt with expiratory all over all  lobes and shortness of breath. Respiratory tt rendeerd . Seen and evaluated bt dr Gwenlyn Perking  And spoken with daughter at bedside. Discussed plan  of care. RRt called and spoke with Respiratory therapist . Arranged bed to Step down or ICU as ordered . She spoke with Dr. Gwenlyn Perking

## 2015-10-05 NOTE — Progress Notes (Signed)
1730 seen by Dr. Valarie Cones I spoke with the patient about this. With family Left with orders. Pt appears comfortable. No apprent distress . Awaiting for step down unit

## 2015-10-05 NOTE — Consult Note (Signed)
Name: [name] MRN: [id number] DOB: Sep 18, 1926    ADMISSION DATE:  10/03/2015 CONSULTATION DATE:  8/25  REFERRING MD :  Gwenlyn Perking   CHIEF COMPLAINT:  aecopd   BRIEF PATIENT DESCRIPTION:  82 y.o.malewith medical history significant of hypertension, hyperlipidemia, gout, depression, CAD, PUD, BPH, chronic combined systolic and diastolic CHF, carotid artery stenosis, AAA, anemia, PAF on Xarelto, who presented 8/23  with cough, shortness of breath, fever and left sided chest pain, coughing up yellow colored sputum. left side, constant, moderate, sharp, pleuritic. It is aggravated by coughing and deep breath. Admitted w/ working dx of HCAP and AECOPD. Had an event the afternoon on 8/25 where he was c/o shortness of breath but this resolved w/ rescue BDs and positional changes. PCCM was asked to see for concern about worsening respiratory failure  SIGNIFICANT EVENTS    STUDIES:    PAST MEDICAL HISTORY :   has a past medical history of AAA (abdominal aortic aneurysm) (HCC); Anemia; Asthma; Carotid artery disease (HCC); CHF (congestive heart failure) (HCC); Coronary artery disease; Depression; Gout; H. pylori infection; Hard of hearing; Hiatal hernia; Hyperplasia, prostate; Hypertension; PUD (peptic ulcer disease); and Renal failure, acute (HCC) (11/08/2012).  has a past surgical history that includes Esophagogastroduodenoscopy (06/13/2008,12/31/12); Carotid stent insertion (2015); left heart catheterization with coronary angiogram (N/A, 05/04/2013); and carotid stent insertion (N/A, 05/09/2013). Prior to Admission medications   Medication Sig Start Date End Date Taking? Authorizing Provider  acetaminophen (TYLENOL) 325 MG tablet Take 325-650 mg by mouth every 6 (six) hours as needed for fever.   Yes Historical Provider, MD  allopurinol (ZYLOPRIM) 300 MG tablet Take 300 mg by mouth daily. To prevent gout   Yes Historical Provider, MD  aspirin EC 81 MG tablet Take 1 tablet (81 mg total) by  mouth daily. 09/08/13  Yes Marvel Plan, MD  atorvastatin (LIPITOR) 40 MG tablet Take 1 tablet (40 mg total) by mouth daily at 6 PM. 05/11/13  Yes Brittainy Sherlynn Carbon, PA-C  carvedilol (COREG) 3.125 MG tablet Take 1 tablet (3.125 mg total) by mouth 2 (two) times daily with a meal. 06/19/14  Yes Vassie Loll, MD  esomeprazole (NEXIUM) 40 MG capsule Take 1 capsule (40 mg total) by mouth daily. 07/31/15  Yes Margarita Grizzle, MD  ipratropium-albuterol (DUONEB) 0.5-2.5 (3) MG/3ML SOLN Take 3 mLs by nebulization 2 (two) times daily. May use 1 additional dose during the day if needed   Yes Historical Provider, MD  isosorbide mononitrate (IMDUR) 30 MG 24 hr tablet Take 30 mg by mouth once daily Patient taking differently: Take 30 mg by mouth daily. For control of heart pain 07/31/15  Yes Margarita Grizzle, MD  nitroGLYCERIN (NITROSTAT) 0.4 MG SL tablet Place 1 tablet (0.4 mg total) under the tongue every 5 (five) minutes x 3 doses as needed for chest pain. 05/11/13  Yes Brittainy Sherlynn Carbon, PA-C  Rivaroxaban (XARELTO) 15 MG TABS tablet Take 15 mg by mouth daily. To prevent strokes   Yes Historical Provider, MD  tamsulosin (FLOMAX) 0.4 MG CAPS capsule Take 0.4 mg by mouth daily. To improve bladder function   Yes Historical Provider, MD   Allergies  Allergen Reactions  . Levofloxacin Anaphylaxis and Other (See Comments)    Patient told his daughter that it made him "feel worse than he already did" (overall) Headache also (has refused to take anymore)  . Eliquis [Apixaban] Itching and Swelling  . Phenazopyridine Hcl Other (See Comments)    Unknown reaction  . Septra [Sulfamethoxazole-Trimethoprim] Nausea And  Vomiting    GI Upset  . Latex Rash  . Tape Rash    FAMILY HISTORY:  family history is not on file. SOCIAL HISTORY:  reports that he quit smoking about 13 years ago. He has never used smokeless tobacco. He reports that he does not drink alcohol or use drugs.  REVIEW OF SYSTEMS:   Unable   SUBJECTIVE:    Appears comfortable VITAL SIGNS: Temp:  [98 F (36.7 C)-100.7 F (38.2 C)] 98.5 F (36.9 C) (08/25 1552) Pulse Rate:  [89-128] 128 (08/25 1415) Resp:  [18-22] 22 (08/25 1415) BP: (111-166)/(67-88) 166/88 (08/25 1415) SpO2:  [94 %-97 %] 96 % (08/25 1415) Weight:  [150 lb 14.4 oz (68.4 kg)] 150 lb 14.4 oz (68.4 kg) (08/25 0532) Room air  PHYSICAL EXAMINATION: General: frail 82 year old male, now resting comfortably  Neuro:  Awake, alert, not english speaking  HEENT:  NCAT, no JVD Cardiovascular:  Rrr, no MRG Lungs:  Faint exp wheeze, no rales, no accessory use  Abdomen:  Soft, not tender + bowel sounds  Musculoskeletal:  Intact, equal st and bulk  Skin:  Warm and dry    Recent Labs Lab 10/03/15 1744  NA 128*  K 3.9  CL 99*  CO2 19*  BUN 26*  CREATININE 3.00*  GLUCOSE 118*    Recent Labs Lab 10/03/15 1744  HGB 10.8*  HCT 32.5*  WBC 27.6*  PLT 194   Dg Chest 2 View  Result Date: 10/03/2015 CLINICAL DATA:  Left-sided chest pain, shortness of breath and fever. EXAM: CHEST  2 VIEW COMPARISON:  Chest x-ray and chest CT studies on 07/31/2015 FINDINGS: Interval development of new left upper lobe infiltrate. No associated edema or pleural fluid. Mild atelectasis present at both lung bases. Stable mild cardiac enlargement. Bony structures show stable spondylosis of the thoracic spine. IMPRESSION: New left upper lobe pneumonia. Electronically Signed   By: Irish LackGlenn  Yamagata M.D.   On: 10/03/2015 18:43   Dg Chest Port 1 View  Result Date: 10/05/2015 CLINICAL DATA:  Acute on chronic respiratory failure. EXAM: PORTABLE CHEST 1 VIEW COMPARISON:  Radiographs of October 03, 2015. FINDINGS: Stable cardiomediastinal silhouette. No pneumothorax is noted. Right lung is clear. Atherosclerosis of thoracic aorta is noted. Left upper lobe opacity is slightly increased compared to prior exam consistent with pneumonia. Bony thorax is unremarkable. IMPRESSION: Increased left upper lobe opacity is  noted consistent with worsening pneumonia. Continued radiographic follow-up is recommended. Aortic atherosclerosis. Electronically Signed   By: Lupita RaiderJames  Green Jr, M.D.   On: 10/05/2015 16:12    ASSESSMENT / PLAN:  AECOPD HCAP vs CAP Gastroenteritis  Anxiety  GERD afib Acute on chronic renal failure  BPH HTN  AECOPD in setting of CAP Vs HCAP (LUL).   Has had significant chronic dyspnea for > 20 yrs. Stopped smoking about 17 yrs ago. Still active, but breathing limits day to day ADLs. He is in here w/ what seems to be CAP vs HCAP involving the LUL. Developed acute shortness of breath this afternoon which was associated w/ anxiety, moaning and possibly wheezing which has all resolved. At any rate he is a frail 82 year old man w/ COPD and now a PNA. Had an excellent d/w his daughter who speaks on his behalf. He would not want mechanical ventilation should he ever get worse to the point he would need life support. Based on our discussion she has agreed that he would want to be DNR and not want ETT. He would  try NIPPV for short term (although I suspect w/ language barrier he may not tolerate this well).   Plan/rec -Cont scheduled BDs: now on Duonebs q 6 and budesonide q12. At d/c would change him to brovana/budesonide bid and PRN albuterol -f/u PCXR -cont current abx (should be able to dc vanc soon) -cont PCT algo -cont current pred dosing today; could start taper in am over ~2 weeks -added BID PPI -we will be available if needed.  - made him DNR   Simonne Martinet ACNP-BC [facility] Pulmonary/Critical Care Pager # [phone] OR # [phone] if no answer   10/05/2015, 4:17 PM  STAFF NOTE: I, Rory Percy, MD FACP have personally reviewed patient's available data, including medical history, events of note, physical examination and test results as part of my evaluation. I have discussed with resident/NP and other care providers such as pharmacist, RN and RRT. In addition, I personally  evaluated patient and elicited key findings of: mild distress, mild wheezing, distant overall, able to speak, early events noted, now improved but with some residual increase RR, PNA ABX to continue, I am concernd about copd exacerbation but also for secretions related as well with Left PNA, continued abx, mucolytics, pcxr in am , move to sdu, increase freq nebs and incrase solumedral, may need to dc coreg if this event re occurs of bronchospasm Will re visit in am   Mcarthur Rossetti. Tyson Alias, MD, FACP Pgr: [phone] [facility] 10/05/2015 7:08 PM

## 2015-10-05 NOTE — Evaluation (Signed)
Physical Therapy Evaluation Patient Details Name: [name] MRN: [id number] DOB: Oct 13, 1926 Today's Date: 10/05/2015   History of Present Illness  82 y.o. male with medical history significant of hypertension, gout, depression, CAD, PUD, chronic confined systolic and diastolic CHF, AAA, anemia, PAF on Xarelto, who presents with cough, shortness of breath, fever and chest pain. +pneumonia, +elevated troponins (believed to be demand ischemia),   Clinical Impression  Pt admitted with above diagnosis. Patient is very limited in his strength/endurance/activity at home per daughter. Patient is requiring more and more assistance and she hopes pt will regain some strength and independence. Today he was limited by incr RR and wheezing. Pt currently with functional limitations due to the deficits listed below (see PT Problem List).  Pt will benefit from skilled PT to increase their independence and safety with mobility to allow discharge to the venue listed below.       Follow Up Recommendations Home health PT (*daughter not present at end of session to discuss)    Equipment Recommendations  Rolling walker with 5" wheels;3in1 (PT) (daughter in agreement)    Recommendations for Other Services       Precautions / Restrictions Precautions Precautions: Fall Precaution Comments: daughter states no falls at home, but sometimes they have to help him walk due to weakness      Mobility  Bed Mobility               General bed mobility comments: sitting at EOB on arrival  Transfers Overall transfer level: Needs assistance   Transfers: Sit to/from Stand;Stand Pivot Transfers Sit to Stand: Min assist Stand pivot transfers: Min assist       General transfer comment: weak and slightly unsteady with wide BOS; pivot x 2 (one transfer pt getting up unassisted after just explained to call for assistance--with pt return demonstrating use of call bell)  Ambulation/Gait Ambulation/Gait  assistance: Min assist Ambulation Distance (Feet): 3 Feet (rest, 4 (after up without assist, to return to chair)) Assistive device: 1 person hand held assist Gait Pattern/deviations: Step-to pattern;Decreased stride length;Shuffle     General Gait Details: selects wide BOS; pt with wheezing and labored breath, therefore further gait deferred. daughter wants RW for home use and educated daughter on proper use of RW  Stairs            Wheelchair Mobility    Modified Rankin (Stroke Patients Only)       Balance Overall balance assessment: Needs assistance Sitting-balance support: No upper extremity supported;Feet supported Sitting balance-Leahy Scale: Good Sitting balance - Comments: reaching outside his BOS wihtout difficulty   Standing balance support: Single extremity supported Standing balance-Leahy Scale: Poor                               Pertinent Vitals/Pain Pain Assessment: No/denies pain    Home Living Family/patient expects to be discharged to:: Private residence Living Arrangements: Spouse/significant other;Children Available Help at Discharge: Family;Available 24 hours/day (Daughter, spouse; + others)           Home Equipment: Gilmer Mor - single point Additional Comments: daughter is interested in getting a RW and 3n1    Prior Function Level of Independence: Needs assistance   Gait / Transfers Assistance Needed: at times walks with no device, sometimes cane, sometimes family assists  ADL's / Homemaking Assistance Needed: wife or son assists (daughter not sure how much)        Hand Dominance  Extremity/Trunk Assessment   Upper Extremity Assessment: Defer to OT evaluation           Lower Extremity Assessment: Generalized weakness      Cervical / Trunk Assessment: Normal  Communication   Communication: HOH;Interpreter utilized;Prefers language other than English ([place], prefers daughter translate (difficulty hearing phone))   Cognition Arousal/Alertness: Awake/alert Behavior During Therapy: WFL for tasks assessed/performed Overall Cognitive Status: Difficult to assess                      General Comments General comments (skin integrity, edema, etc.): Multiple interruptions during session (with each discipline requesting daughter to translate before she leaves).    Exercises        Assessment/Plan    PT Assessment Patient needs continued PT services  PT Diagnosis Generalized weakness;Difficulty walking   PT Problem List Decreased strength;Decreased activity tolerance;Decreased balance;Decreased mobility;Decreased knowledge of use of DME;Decreased safety awareness;Cardiopulmonary status limiting activity  PT Treatment Interventions DME instruction;Gait training;Functional mobility training;Therapeutic activities;Balance training;Patient/family education   PT Goals (Current goals can be found in the Care Plan section) Acute Rehab PT Goals Patient Stated Goal: return home PT Goal Formulation: With patient/family Time For Goal Achievement: 10/12/15 Potential to Achieve Goals: Good    Frequency Min 3X/week   Barriers to discharge        Co-evaluation               End of Session Equipment Utilized During Treatment: Oxygen Activity Tolerance: Treatment limited secondary to medical complications (Comment) (respiratory status) Patient left: in chair;with call bell/phone within reach;with chair alarm set Nurse Communication: Mobility status         Time: [id number] PT Time Calculation (min) (ACUTE ONLY): 32 min   Charges:   PT Evaluation $PT Eval Low Complexity: 1 Procedure PT Treatments $Therapeutic Activity: 8-22 mins   PT G Codes:        Eryca Bolte 10/05/2015, 12:16 PM  Pager [phone]

## 2015-10-05 NOTE — Progress Notes (Signed)
[facility] PROGRESS NOTE  [name] AVW:[id number] DOB: 07/20/1926 DOA: 10/03/2015 PCP: Pamelia HoitWILSON,FRED HENRY, MD  Interim summary and HPI 82 y.o. male with medical history significant of hypertension, hyperlipidemia, gout, depression, CAD, PUD, BPH, chronic confined systolic and diastolic CHF, carotid artery stenosis, AAA, anemia, PAF on Xarelto, who presents with cough, shortness of breath, fever and chest pain. Patient does not speak [place], he speaks [place] language. Per his son, patient has been having cough, fever, shortness of breath, chest pain for 3 days. He coughs up yellow colored sputum. His chest pain is located in the left side, constant, moderate, sharp, pleuritic. It is aggravated by coughing and deep breath. No tenderness over calf areas.  Patient also has nausea, vomiting, diarrhea, abdominal pain in the past 3 days. He vomited twice without blood in the vomitus. He had 3 bowel movements with loose stool.  Found to have an elevated lactate at 2.46, WBC 27.6, troponin 0.72, BNP 156, lipase 23, negative urinalysis, negative C. difficile PCR, worsening renal function. Chest x-ray showed infiltration in left upper lobe. Patient is admitted for further treatment of his sepsis due to HCAP.   Assessment/Plan: HCAP and sepsis/acute resp failure with hypoxia/COPD exacerbation:  Patient's pleuritic chest pain, fever, shortness of breath, productive cough, Chest x-ray findings of infiltration in left upper lobe, consistent with HCAP. Pt is septic with leukocytosis, elevated lactate, tachycardia, tachypnea. And now with acute resp failure with hypoxia - Will continue current antibiotic therapy -follow culture data and clinical response -continue pulmicort and flutter valve; will also start solumedrol TID -PRN oxygen supplementation  -PRN analgesics for pleuritic pain  Dehydration and hyponatremia: Due to poor PO intake and GI loses -will continue gentle hydration and will  follow electrolytes trend   HTN: Blood pressure 157/84 -Continue Coreg -also on Flomax   BPH: stable - Continue Flomax -no complaints of urinary retention   Atrial Fibrillation: CHA2DS2-VASc Score is 6, needs oral anticoagulation. Patient is Xarelto at home. Heart rate is at 110s. -continue coreg -continue Xarelto (dose to be adjusted for renal function as needed)  Acute on chronic renal failure stage III: Baseline Cre is 1.8-1.9, pt's Cre is 3.0, BUN 26 on admission. Likely due to prerenal failure. -continue IVF as above -Follow up renal function by BMP -minimize nephrotoxic agents  HLD: Last LDL was 35 on 10/04/15 -Continue Lipitor  CAD and elevated trop: Troponin 0.72. Patient has chest pain, which is pruritic, likely due to pneumonia. Elevated troponin is likely due to demanding ischemia secondary to sepsis. - repeated EKG w/o acute ischemic changes - will continue Nitroglycerin, Morphine, aspirin, lipitor and Imdur - follow 2-D echo -given patient's age and cormobidities medically management is recommended   Nausea, vomiting and diarrhea and Abdominal pain: Lipase 23. C diff pcr are negative. Physical examination of abdomen is benign, no acute abdomen. -GI panel positive for E. Coli enteropathogenic  -will continue supportive care -started on probiotics -patient drank some herbal laxatives tea 2-3 days prior to admission according to family  -continue PRN antiemetics   GERD: -continue Pepcid IV  Code Status: DNR Family Communication: daughter at bedside  Disposition Plan: continue current antibiotic therapy, continue Pulmicort and flutter valve; will also start low dose solumedrol and move patient to stepdown for closer observation and PRN BIPAP if needed.   Consultants:  Pulmonary service   Procedures:  See below for x-ray   Antibiotics:  Vancomycin zithromax and cefepime 10/03/15  HPI/Subjective: Low grade fever overnight. Complaining of acute resp  failure and chest tightness sensation. Increase wheezing, poor air movement  And unable to speak in full sentences  Objective: Vitals:   10/05/15 0532 10/05/15 1415  BP: 111/78 (!) 166/88  Pulse: (!) 103 (!) 128  Resp: 18 (!) 22  Temp: 98.3 F (36.8 C) 99.9 F (37.7 C)    Intake/Output Summary (Last 24 hours) at 10/05/15 1530 Last data filed at 10/05/15 1420  Gross per 24 hour  Intake             2995 ml  Output             1150 ml  Net             1845 ml   Filed Weights   10/03/15 2258 10/04/15 0504 10/05/15 0532  Weight: 66.2 kg (146 lb) 67.3 kg (148 lb 4.8 oz) 68.4 kg (150 lb 14.4 oz)    Exam:   General:  Low grade temp overnight. Patient in acute resp distress and with very poor air movement. Positive use of accessory muscles and uncomfortable.   Cardiovascular: positive SEM, no rubs, no gallops, S1 and S2 appreciated  Respiratory: positive wheezing, using accessory muscles, scattered rhonchi; poor air movement   Abdomen: soft, NT, ND, positive BS  Musculoskeletal: no edema or cyanosis   Data Reviewed: Basic Metabolic Panel:  Recent Labs Lab 10/03/15 1744  NA 128*  K 3.9  CL 99*  CO2 19*  GLUCOSE 118*  BUN 26*  CREATININE 3.00*  CALCIUM 8.8*    Recent Labs Lab 10/03/15 2316  LIPASE 23   CBC:  Recent Labs Lab 10/03/15 1744 10/03/15 2137  WBC 27.6*  --   NEUTROABS  --  23.7*  HGB 10.8*  --   HCT 32.5*  --   MCV 91.3  --   PLT 194  --    Cardiac Enzymes:  Recent Labs Lab 10/03/15 2316 10/04/15 0507 10/04/15 1019  TROPONINI 0.62* 0.47* 0.35*   BNP (last 3 results)  Recent Labs  07/27/15 0609 07/31/15 1545 10/03/15 2316  BNP 71.2 39.0 156.1*   CBG: No results for input(s): GLUCAP in the last 168 hours.  Recent Results (from the past 240 hour(s))  Urine culture     Status: None   Collection Time: 10/03/15  8:45 PM  Result Value Ref Range Status   Specimen Description URINE, RANDOM  Final   Special Requests NONE  Final    Culture NO GROWTH  Final   Report Status 10/05/2015 FINAL  Final  Blood Culture (routine x 2)     Status: None (Preliminary result)   Collection Time: 10/03/15  8:57 PM  Result Value Ref Range Status   Specimen Description BLOOD LEFT HAND  Final   Special Requests IN PEDIATRIC BOTTLE 3CC  Final   Culture NO GROWTH < 24 HOURS  Final   Report Status PENDING  Incomplete  Blood Culture (routine x 2)     Status: None (Preliminary result)   Collection Time: 10/03/15  9:05 PM  Result Value Ref Range Status   Specimen Description BLOOD RIGHT HAND  Final   Special Requests BOTTLES DRAWN AEROBIC ONLY 5CC  Final   Culture NO GROWTH < 24 HOURS  Final   Report Status PENDING  Incomplete  Respiratory Panel by PCR     Status: None   Collection Time: 10/03/15 11:40 PM  Result Value Ref Range Status   Adenovirus NOT DETECTED NOT DETECTED Final   Coronavirus 229E NOT DETECTED NOT  DETECTED Final   Coronavirus HKU1 NOT DETECTED NOT DETECTED Final   Coronavirus NL63 NOT DETECTED NOT DETECTED Final   Coronavirus OC43 NOT DETECTED NOT DETECTED Final   Metapneumovirus NOT DETECTED NOT DETECTED Final   Rhinovirus / Enterovirus NOT DETECTED NOT DETECTED Final   Influenza A NOT DETECTED NOT DETECTED Final   Influenza B NOT DETECTED NOT DETECTED Final   Parainfluenza Virus 1 NOT DETECTED NOT DETECTED Final   Parainfluenza Virus 2 NOT DETECTED NOT DETECTED Final   Parainfluenza Virus 3 NOT DETECTED NOT DETECTED Final   Parainfluenza Virus 4 NOT DETECTED NOT DETECTED Final   Respiratory Syncytial Virus NOT DETECTED NOT DETECTED Final   Bordetella pertussis NOT DETECTED NOT DETECTED Final   Chlamydophila pneumoniae NOT DETECTED NOT DETECTED Final   Mycoplasma pneumoniae NOT DETECTED NOT DETECTED Final  C difficile quick screen w PCR reflex     Status: None   Collection Time: 10/04/15  1:53 AM  Result Value Ref Range Status   C Diff antigen NEGATIVE NEGATIVE Final   C Diff toxin NEGATIVE NEGATIVE Final   C  Diff interpretation No C. difficile detected.  Final  Gastrointestinal Panel by PCR , Stool     Status: Abnormal   Collection Time: 10/04/15  1:53 AM  Result Value Ref Range Status   Campylobacter species NOT DETECTED NOT DETECTED Final   Plesimonas shigelloides NOT DETECTED NOT DETECTED Final   Salmonella species NOT DETECTED NOT DETECTED Final   Yersinia enterocolitica NOT DETECTED NOT DETECTED Final   Vibrio species NOT DETECTED NOT DETECTED Final   Vibrio cholerae NOT DETECTED NOT DETECTED Final   Enteroaggregative E coli (EAEC) NOT DETECTED NOT DETECTED Final   Enteropathogenic E coli (EPEC) DETECTED (A) NOT DETECTED Final    Comment: CRITICAL RESULT CALLED TO, READ BACK BY AND VERIFIED WITH: ANNIE MAGBATANG AT 1554 10/04/15 SDR    Enterotoxigenic E coli (ETEC) NOT DETECTED NOT DETECTED Final   Shiga like toxin producing E coli (STEC) NOT DETECTED NOT DETECTED Final   E. coli O157 NOT DETECTED NOT DETECTED Final   Shigella/Enteroinvasive E coli (EIEC) NOT DETECTED NOT DETECTED Final   Cryptosporidium NOT DETECTED NOT DETECTED Final   Cyclospora cayetanensis NOT DETECTED NOT DETECTED Final   Entamoeba histolytica NOT DETECTED NOT DETECTED Final   Giardia lamblia NOT DETECTED NOT DETECTED Final   Adenovirus F40/41 NOT DETECTED NOT DETECTED Final   Astrovirus NOT DETECTED NOT DETECTED Final   Norovirus GI/GII NOT DETECTED NOT DETECTED Final   Rotavirus A NOT DETECTED NOT DETECTED Final   Sapovirus (I, II, IV, and V) NOT DETECTED NOT DETECTED Final     Studies: Dg Chest 2 View  Result Date: 10/03/2015 CLINICAL DATA:  Left-sided chest pain, shortness of breath and fever. EXAM: CHEST  2 VIEW COMPARISON:  Chest x-ray and chest CT studies on 07/31/2015 FINDINGS: Interval development of new left upper lobe infiltrate. No associated edema or pleural fluid. Mild atelectasis present at both lung bases. Stable mild cardiac enlargement. Bony structures show stable spondylosis of the thoracic  spine. IMPRESSION: New left upper lobe pneumonia. Electronically Signed   By: Irish Lack M.D.   On: 10/03/2015 18:43    Scheduled Meds: . allopurinol  300 mg Oral Daily  . aspirin EC  81 mg Oral Daily  . atorvastatin  40 mg Oral q1800  . azithromycin  500 mg Oral QHS  . budesonide (PULMICORT) nebulizer solution  0.25 mg Nebulization BID  . carvedilol  3.125 mg  Oral BID WC  . ceFEPime (MAXIPIME) IV  1 g Intravenous Q24H  . dextromethorphan-guaiFENesin  1 tablet Oral BID  . famotidine  20 mg Oral BID  . ipratropium-albuterol  3 mL Nebulization TID  . isosorbide mononitrate  30 mg Oral Daily  . Rivaroxaban  15 mg Oral Q breakfast  . saccharomyces boulardii  250 mg Oral BID  . tamsulosin  0.4 mg Oral Daily  . vancomycin  1,000 mg Intravenous Q48H   Continuous Infusions: . sodium chloride 1,000 mL (10/05/15 0230)    Principal Problem:   HCAP (healthcare-associated pneumonia) Active Problems:   Hypertension   Atrial fibrillation- unknown duration   Acute renal failure superimposed on stage 3 chronic kidney disease (HCC)   Hyperlipidemia   Coronary artery disease   Chronic anticoagulation   Carotid artery disease (HCC)   Sepsis (HCC)   Elevated troponin   Nausea, vomiting and diarrhea   Abdominal pain    Time spent: 30 minutes    Vassie Loll  [facility] Pager [phone]. If 7PM-7AM, please contact night-coverage at [email or web address], password [facility] 10/05/2015, 3:30 PM  LOS: 2 days

## 2015-10-05 NOTE — Progress Notes (Signed)
Pt breathing better . Respiratory easy and regular . Intercoastal respiration easier

## 2015-10-05 NOTE — Progress Notes (Signed)
  Echocardiogram 2D Echocardiogram has been performed.  Delcie Roch 10/05/2015, 9:54 AM

## 2015-10-05 NOTE — Progress Notes (Signed)
OT Cancellation Note  Patient Details Name: [name] MRN: [id number] DOB: 10/19/1926   Cancelled Treatment:    Reason Eval/Treat Not Completed: Patient at procedure or test/ unavailable (having echo)  [facility] Ranferi Clingan, OTR/L  [phone] 10/05/2015 10/05/2015, 9:30 AM

## 2015-10-05 NOTE — Progress Notes (Signed)
1830 report given to Sarah RN fro 3S 1845 Pt transferred to 3 S via bed with cardiac monitor on O2 2l ?m [place] . Family at waiting room . RN aware. Pt. No apparent distress

## 2015-10-05 NOTE — Progress Notes (Signed)
Pharmacy Antibiotic Note  [name] is a 82 y.o. male admitted on 10/03/2015 with pneumonia and sepsis. Pharmacy has been consulted for cefepime and vancomycin dosing.   Day #3 of abx for sepsis / PNA. Had abx course in last 3 months. CXR shows new left upper lobe PNA. No MRSA PCR screen. Strep Pneumo negative. Legionella in process. Of note, patient is positive for EPEC from GI panel which could be reason for diarrhea. Supportive care is mainstay of treatment. Tmax 101.6 yesterday, WBC elevated at 27.6. SCr 3.0, CrCl ~29ml/min.  Plan: Continue cefepime 1g IV Q24 Continue vancomycin 1g IV Q48 (goal 15-20) Continue azithromycin 500mg  IV Q24 per MD Monitor clinical picture, renal function, VT prn F/U C&S, abx deescalation / LOT  If clinical improvement may be able to stop vancomycin soon If legionella negative, could consider stopping azithromycin   Height: 5\' 2"  (157.5 cm) Weight: 150 lb 14.4 oz (68.4 kg) IBW/kg (Calculated) : 54.6  Temp (24hrs), Avg:99 F (37.2 C), Min:98 F (36.7 C), Max:100.7 F (38.2 C)   Recent Labs Lab 10/03/15 1744 10/03/15 2112 10/03/15 2316 10/04/15 0249  WBC 27.6*  --   --   --   CREATININE 3.00*  --   --   --   LATICACIDVEN  --  2.46* 1.8 1.5    Estimated Creatinine Clearance: 14.7 mL/min (by C-G formula based on SCr of 3 mg/dL).    Allergies  Allergen Reactions  . Levofloxacin Anaphylaxis and Other (See Comments)    Patient told his daughter that it made him "feel worse than he already did" (overall) Headache also (has refused to take anymore)  . Eliquis [Apixaban] Itching and Swelling  . Phenazopyridine Hcl Other (See Comments)    Unknown reaction  . Septra [Sulfamethoxazole-Trimethoprim] Nausea And Vomiting    GI Upset  . Latex Rash  . Tape Rash    Antimicrobials this admission: Vancomycin 8/23 >>  Cefepime 8/23 >>  Azithromycin 8/23 >>  Dose adjustments this admission: n/a  Microbiology results: 8/23 BCx: ngtd 8/23  UCx:  Sent 8/23 GI Panel: Enteropathogenic E. coli   Thank you for allowing pharmacy to be a part of this patient's care.  Enzo Bi, PharmD, BCPS Clinical Pharmacist Pager [phone] 10/05/2015 8:51 AM

## 2015-10-05 NOTE — Progress Notes (Signed)
Pulmonary NP came in and spoke with daughter. Daughter understood

## 2015-10-06 ENCOUNTER — Inpatient Hospital Stay (HOSPITAL_COMMUNITY): Payer: Medicare Other

## 2015-10-06 DIAGNOSIS — I5022 Chronic systolic (congestive) heart failure: Secondary | ICD-10-CM

## 2015-10-06 LAB — CBC
HEMATOCRIT: 28.2 % — AB (ref 39.0–52.0)
Hemoglobin: 9.4 g/dL — ABNORMAL LOW (ref 13.0–17.0)
MCH: 30.2 pg (ref 26.0–34.0)
MCHC: 33.3 g/dL (ref 30.0–36.0)
MCV: 90.7 fL (ref 78.0–100.0)
PLATELETS: 214 10*3/uL (ref 150–400)
RBC: 3.11 MIL/uL — AB (ref 4.22–5.81)
RDW: 15.4 % (ref 11.5–15.5)
WBC: 12.2 10*3/uL — AB (ref 4.0–10.5)

## 2015-10-06 LAB — BASIC METABOLIC PANEL
Anion gap: 10 (ref 5–15)
BUN: 16 mg/dL (ref 6–20)
CHLORIDE: 105 mmol/L (ref 101–111)
CO2: 18 mmol/L — AB (ref 22–32)
CREATININE: 1.76 mg/dL — AB (ref 0.61–1.24)
Calcium: 8.9 mg/dL (ref 8.9–10.3)
GFR calc non Af Amer: 33 mL/min — ABNORMAL LOW (ref >60)
GFR, EST AFRICAN AMERICAN: 38 mL/min — AB (ref >60)
Glucose, Bld: 195 mg/dL — ABNORMAL HIGH (ref 65–99)
POTASSIUM: 4.1 mmol/L (ref 3.5–5.1)
SODIUM: 133 mmol/L — AB (ref 135–145)

## 2015-10-06 LAB — GLUCOSE, CAPILLARY
GLUCOSE-CAPILLARY: 229 mg/dL — AB (ref 65–99)
Glucose-Capillary: 162 mg/dL — ABNORMAL HIGH (ref 65–99)
Glucose-Capillary: 217 mg/dL — ABNORMAL HIGH (ref 65–99)
Glucose-Capillary: 230 mg/dL — ABNORMAL HIGH (ref 65–99)

## 2015-10-06 LAB — LEGIONELLA PNEUMOPHILA SEROGP 1 UR AG: L. pneumophila Serogp 1 Ur Ag: NEGATIVE

## 2015-10-06 LAB — VANCOMYCIN, RANDOM: VANCOMYCIN RM: 12

## 2015-10-06 LAB — MRSA PCR SCREENING: MRSA by PCR: NEGATIVE

## 2015-10-06 MED ORDER — SODIUM CHLORIDE 0.9 % IV SOLN
INTRAVENOUS | Status: DC
  Administered 2015-10-06 – 2015-10-09 (×5): via INTRAVENOUS

## 2015-10-06 MED ORDER — INSULIN ASPART 100 UNIT/ML ~~LOC~~ SOLN
0.0000 [IU] | Freq: Every day | SUBCUTANEOUS | Status: DC
  Administered 2015-10-06: 2 [IU] via SUBCUTANEOUS
  Administered 2015-10-08: 3 [IU] via SUBCUTANEOUS
  Administered 2015-10-09: 2 [IU] via SUBCUTANEOUS

## 2015-10-06 MED ORDER — INSULIN ASPART 100 UNIT/ML ~~LOC~~ SOLN
0.0000 [IU] | Freq: Three times a day (TID) | SUBCUTANEOUS | Status: DC
  Administered 2015-10-06: 2 [IU] via SUBCUTANEOUS

## 2015-10-06 MED ORDER — METHYLPREDNISOLONE SODIUM SUCC 125 MG IJ SOLR
60.0000 mg | Freq: Three times a day (TID) | INTRAMUSCULAR | Status: DC
  Administered 2015-10-06: 60 mg via INTRAVENOUS
  Filled 2015-10-06: qty 2

## 2015-10-06 MED ORDER — INSULIN ASPART 100 UNIT/ML ~~LOC~~ SOLN
0.0000 [IU] | Freq: Three times a day (TID) | SUBCUTANEOUS | Status: DC
Start: 2015-10-06 — End: 2015-10-10
  Administered 2015-10-06 (×2): 3 [IU] via SUBCUTANEOUS
  Administered 2015-10-07: 2 [IU] via SUBCUTANEOUS
  Administered 2015-10-07: 3 [IU] via SUBCUTANEOUS
  Administered 2015-10-07: 5 [IU] via SUBCUTANEOUS
  Administered 2015-10-08: 3 [IU] via SUBCUTANEOUS
  Administered 2015-10-08: 2 [IU] via SUBCUTANEOUS
  Administered 2015-10-08: 7 [IU] via SUBCUTANEOUS
  Administered 2015-10-09: 3 [IU] via SUBCUTANEOUS
  Administered 2015-10-09: 2 [IU] via SUBCUTANEOUS
  Administered 2015-10-09: 3 [IU] via SUBCUTANEOUS

## 2015-10-06 MED ORDER — INSULIN ASPART 100 UNIT/ML ~~LOC~~ SOLN: 0.0000 [IU] | Freq: Three times a day (TID) | SUBCUTANEOUS | Status: DC

## 2015-10-06 MED ORDER — VANCOMYCIN HCL IN DEXTROSE 1-5 GM/200ML-% IV SOLN
1000.0000 mg | INTRAVENOUS | Status: DC
  Filled 2015-10-06: qty 200

## 2015-10-06 MED ORDER — METHYLPREDNISOLONE SODIUM SUCC 125 MG IJ SOLR
60.0000 mg | Freq: Two times a day (BID) | INTRAMUSCULAR | Status: DC
  Administered 2015-10-07 – 2015-10-08 (×3): 60 mg via INTRAVENOUS
  Filled 2015-10-06 (×3): qty 2

## 2015-10-06 NOTE — Progress Notes (Signed)
[facility] PROGRESS NOTE  [name] Supple GNF:[id number] DOB: 01/29/1927 DOA: 10/03/2015 PCP: [name],Jonathon HENRY, [name]  Interim summary and HPI 82 y.o. male with medical history significant of hypertension, hyperlipidemia, gout, depression, CAD, PUD, BPH, chronic confined systolic and diastolic CHF, carotid artery stenosis, AAA, anemia, PAF on Xarelto, who presents with cough, shortness of breath, fever and chest pain. Patient does not speak [place], he speaks [place] language. Per his son, patient has been having cough, fever, shortness of breath, chest pain for 3 days. He coughs up yellow colored sputum. His chest pain is located in the left side, constant, moderate, sharp, pleuritic. It is aggravated by coughing and deep breath. No tenderness over calf areas.  Patient also has nausea, vomiting, diarrhea, abdominal pain in the past 3 days. He vomited twice without blood in the vomitus. He had 3 bowel movements with loose stool.  Found to have an elevated lactate at 2.46, WBC 27.6, troponin 0.72, BNP 156, lipase 23, negative urinalysis, negative C. difficile PCR, worsening renal function. Chest x-ray showed infiltration in left upper lobe. Patient is admitted for further treatment of his sepsis due to HCAP.   Assessment/Plan: HCAP and sepsis/acute resp failure with hypoxia/COPD exacerbation:  Patient's pleuritic chest pain, fever, shortness of breath, productive cough, Chest x-ray findings of infiltration in left upper lobe, consistent with HCAP. Pt is septic with leukocytosis, elevated lactate, tachycardia, tachypnea.  -Will continue current antibiotic therapy -follow culture data and clinical response -continue pulmicort and flutter valve; will also continue solumedrol TID -PRN oxygen supplementation  -PRN analgesics for pleuritic pain -appreciate assistance and recommendations from pulmonary service  Dehydration and hyponatremia: -Due to poor PO intake and GI loses -has received fluid  resuscitation and his dehydration status is resolved -will advance diet as tolerated; discussed with daughter and encourage patient to maintain adequate oral hydration   HTN: Blood pressure 157/84 -Continue Coreg -also on Flomax   BPH: stable - Continue Flomax -no complaints of urinary retention   Atrial Fibrillation: CHA2DS2-VASc Score is 6, needs oral anticoagulation. Patient is Xarelto at home.  -continue coreg for rate control -continue Xarelto (dose to be adjusted for renal function as needed)  Acute on chronic renal failure stage III: Baseline Cre is 1.8-1.9, pt's Cre is 3.0, BUN 26 on admission. Likely due to prerenal failure. -Follow up renal function by BMP intermittently -minimize nephrotoxic agents -after fluid resuscitation his Cr is back to baseline (1.76 currently)   HLD: Last LDL was 35 on 10/04/15 -Continue Lipitor  CAD and elevated trop: Troponin 0.72. Patient has chest pain, which is pruritic, likely due to pneumonia. Elevated troponin is likely due to demanding ischemia secondary to sepsis. - repeated EKG w/o acute ischemic changes - will continue Nitroglycerin, Morphine, aspirin, lipitor and Imdur -2-D echo w/o acute wall motion abnormalities and stable EF (from prior echo on May 2016) -given patient's age and cormobidities medically management is recommended   Chronic systolic HF: -EF 84-69%40-45% -appears compensated -will follow daily weights -strict intake/output -low sodium diet -continue B-blocker and Imdur; no ACE or ARB due to renal failure.  Nausea, vomiting and diarrhea and Abdominal pain: Lipase 23. C diff pcr are negative. Physical examination of abdomen is benign, no acute abdomen. -GI panel positive for E. Coli enteropathogenic (only needs supportive care for that kind of infection) -will continue probiotics -patient drank some herbal laxatives tea 2-3 days prior to admission according to family  -continue PRN antiemetics and slowly advance  diet as tolerated  -reports no abd  pain currently and improvement in loose stools.  GERD: -continue Pepcid IV  Code Status: DNR Family Communication: daughter at bedside  Disposition Plan: continue current antibiotic therapy, continue Pulmicort and flutter valve; will also continue solumedrol and watch for another 24 hours in stepdown. PRN BIPAP (even I feel he will not needed)  Consultants:  Pulmonary service   Procedures:  See below for x-ray   2-D echo - Left ventricle: The cavity size was normal. Wall thickness was   normal. Systolic function was mildly to moderately reduced. The   estimated ejection fraction was in the range of 40% to 45%.   Severe hypokinesis of the inferolateral and inferior myocardium;   consistent with infarction in the distribution of the right   coronary or left circumflex coronary artery. - Mitral valve: There was moderate regurgitation directed   centrally. - Left atrium: The atrium was moderately to severely dilated. - Pulmonary arteries: PA peak pressure: 32 mm Hg (S). - Pericardium, extracardiac: A small pericardial effusion was   identified along the right ventricular free wall. There was no   evidence of hemodynamic compromise.  Impressions: - LV wall motion and overall LVEF similar to the May 2016 images.  Antibiotics:  Vancomycin, zithromax and cefepime 10/03/15  HPI/Subjective: Afebrile. No CP. Reports breathing easier. Able to speak in almost full sentences and reporting good night sleep. Improved air movement on exam.  Objective: Vitals:   10/06/15 0353 10/06/15 0727  BP: 120/69 117/61  Pulse: 76 79  Resp: 18 (!) 21  Temp: 97.9 F (36.6 C) 97.3 F (36.3 C)    Intake/Output Summary (Last 24 hours) at 10/06/15 0924 Last data filed at 10/06/15 0354  Gross per 24 hour  Intake          1406.67 ml  Output             1175 ml  Net           231.67 ml   Filed Weights   10/04/15 0504 10/05/15 0532 10/05/15 1925  Weight:  67.3 kg (148 lb 4.8 oz) 68.4 kg (150 lb 14.4 oz) 68.3 kg (150 lb 9.2 oz)    Exam:   General:  Afebrile, breathing easier and better. Patient denies CP. Reports good night sleep. In no acute distress.   Cardiovascular: positive SEM, no rubs, no gallops, S1 and S2 appreciated  Respiratory: mild exp wheezing, no using accessory muscles, positive scattered rhonchi; improved air movement.  Abdomen: soft, NT, ND, positive BS  Musculoskeletal: no edema or cyanosis appreciated  Data Reviewed: Basic Metabolic Panel:  Recent Labs Lab 10/03/15 1744 10/05/15 1829 10/06/15 0446  NA 128* 128* 133*  K 3.9 3.7 4.1  CL 99* 104 105  CO2 19* 17* 18*  GLUCOSE 118* 171* 195*  BUN 26* 15 16  CREATININE 3.00* 1.78* 1.76*  CALCIUM 8.8* 8.3* 8.9    Recent Labs Lab 10/03/15 2316  LIPASE 23   CBC:  Recent Labs Lab 10/03/15 1744 10/03/15 2137 10/06/15 0446  WBC 27.6*  --  12.2*  NEUTROABS  --  23.7*  --   HGB 10.8*  --  9.4*  HCT 32.5*  --  28.2*  MCV 91.3  --  90.7  PLT 194  --  214   Cardiac Enzymes:  Recent Labs Lab 10/03/15 2316 10/04/15 0507 10/04/15 1019  TROPONINI 0.62* 0.47* 0.35*   BNP (last 3 results)  Recent Labs  07/27/15 0609 07/31/15 1545 10/03/15 2316  BNP 71.2 39.0 156.1*  CBG: No results for input(s): GLUCAP in the last 168 hours.  Recent Results (from the past 240 hour(s))  Urine culture     Status: None   Collection Time: 10/03/15  8:45 PM  Result Value Ref Range Status   Specimen Description URINE, RANDOM  Final   Special Requests NONE  Final   Culture NO GROWTH  Final   Report Status 10/05/2015 FINAL  Final  Blood Culture (routine x 2)     Status: None (Preliminary result)   Collection Time: 10/03/15  8:57 PM  Result Value Ref Range Status   Specimen Description BLOOD LEFT HAND  Final   Special Requests IN PEDIATRIC BOTTLE 3CC  Final   Culture NO GROWTH 2 DAYS  Final   Report Status PENDING  Incomplete  Blood Culture (routine x 2)      Status: None (Preliminary result)   Collection Time: 10/03/15  9:05 PM  Result Value Ref Range Status   Specimen Description BLOOD RIGHT HAND  Final   Special Requests BOTTLES DRAWN AEROBIC ONLY 5CC  Final   Culture NO GROWTH 2 DAYS  Final   Report Status PENDING  Incomplete  Respiratory Panel by PCR     Status: None   Collection Time: 10/03/15 11:40 PM  Result Value Ref Range Status   Adenovirus NOT DETECTED NOT DETECTED Final   Coronavirus 229E NOT DETECTED NOT DETECTED Final   Coronavirus HKU1 NOT DETECTED NOT DETECTED Final   Coronavirus NL63 NOT DETECTED NOT DETECTED Final   Coronavirus OC43 NOT DETECTED NOT DETECTED Final   Metapneumovirus NOT DETECTED NOT DETECTED Final   Rhinovirus / Enterovirus NOT DETECTED NOT DETECTED Final   Influenza A NOT DETECTED NOT DETECTED Final   Influenza B NOT DETECTED NOT DETECTED Final   Parainfluenza Virus 1 NOT DETECTED NOT DETECTED Final   Parainfluenza Virus 2 NOT DETECTED NOT DETECTED Final   Parainfluenza Virus 3 NOT DETECTED NOT DETECTED Final   Parainfluenza Virus 4 NOT DETECTED NOT DETECTED Final   Respiratory Syncytial Virus NOT DETECTED NOT DETECTED Final   Bordetella pertussis NOT DETECTED NOT DETECTED Final   Chlamydophila pneumoniae NOT DETECTED NOT DETECTED Final   Mycoplasma pneumoniae NOT DETECTED NOT DETECTED Final  C difficile quick screen w PCR reflex     Status: None   Collection Time: 10/04/15  1:53 AM  Result Value Ref Range Status   C Diff antigen NEGATIVE NEGATIVE Final   C Diff toxin NEGATIVE NEGATIVE Final   C Diff interpretation No C. difficile detected.  Final  Gastrointestinal Panel by PCR , Stool     Status: Abnormal   Collection Time: 10/04/15  1:53 AM  Result Value Ref Range Status   Campylobacter species NOT DETECTED NOT DETECTED Final   Plesimonas shigelloides NOT DETECTED NOT DETECTED Final   Salmonella species NOT DETECTED NOT DETECTED Final   Yersinia enterocolitica NOT DETECTED NOT DETECTED Final    Vibrio species NOT DETECTED NOT DETECTED Final   Vibrio cholerae NOT DETECTED NOT DETECTED Final   Enteroaggregative E coli (EAEC) NOT DETECTED NOT DETECTED Final   Enteropathogenic E coli (EPEC) DETECTED (A) NOT DETECTED Final    Comment: CRITICAL RESULT CALLED TO, READ BACK BY AND VERIFIED WITH: ANNIE MAGBATANG AT 1554 10/04/15 SDR    Enterotoxigenic E coli (ETEC) NOT DETECTED NOT DETECTED Final   Shiga like toxin producing E coli (STEC) NOT DETECTED NOT DETECTED Final   E. coli O157 NOT DETECTED NOT DETECTED Final   Shigella/Enteroinvasive E  coli (EIEC) NOT DETECTED NOT DETECTED Final   Cryptosporidium NOT DETECTED NOT DETECTED Final   Cyclospora cayetanensis NOT DETECTED NOT DETECTED Final   Entamoeba histolytica NOT DETECTED NOT DETECTED Final   Giardia lamblia NOT DETECTED NOT DETECTED Final   Adenovirus F40/41 NOT DETECTED NOT DETECTED Final   Astrovirus NOT DETECTED NOT DETECTED Final   Norovirus GI/GII NOT DETECTED NOT DETECTED Final   Rotavirus A NOT DETECTED NOT DETECTED Final   Sapovirus (I, II, IV, and V) NOT DETECTED NOT DETECTED Final  MRSA PCR Screening     Status: None   Collection Time: 10/05/15  7:32 PM  Result Value Ref Range Status   MRSA by PCR NEGATIVE NEGATIVE Final    Comment:        The GeneXpert MRSA Assay (FDA approved for NASAL specimens only), is one component of a comprehensive MRSA colonization surveillance program. It is not intended to diagnose MRSA infection nor to guide or monitor treatment for MRSA infections.      Studies: Dg Chest Port 1 View  Result Date: 10/06/2015 CLINICAL DATA:  COPD EXAM: PORTABLE CHEST 1 VIEW COMPARISON:  10/05/2015 FINDINGS: Slight improvement in opacity in the left upper lobe compatible with improving pneumonia. Mild cardiomegaly. Low lung volumes. Right base atelectasis. No visible effusions or acute bony abnormality. IMPRESSION: Low lung volumes with right base atelectasis. Improving left upper lobe pneumonia.  Electronically Signed   By: Charlett Nose M.D.   On: 10/06/2015 07:09   Dg Chest Port 1 View  Result Date: 10/05/2015 CLINICAL DATA:  Acute on chronic respiratory failure. EXAM: PORTABLE CHEST 1 VIEW COMPARISON:  Radiographs of October 03, 2015. FINDINGS: Stable cardiomediastinal silhouette. No pneumothorax is noted. Right lung is clear. Atherosclerosis of thoracic aorta is noted. Left upper lobe opacity is slightly increased compared to prior exam consistent with pneumonia. Bony thorax is unremarkable. IMPRESSION: Increased left upper lobe opacity is noted consistent with worsening pneumonia. Continued radiographic follow-up is recommended. Aortic atherosclerosis. Electronically Signed   By: Lupita Raider, M.D.   On: 10/05/2015 16:12    Scheduled Meds: . allopurinol  300 mg Oral Daily  . aspirin EC  81 mg Oral Daily  . atorvastatin  40 mg Oral q1800  . azithromycin  500 mg Oral QHS  . budesonide (PULMICORT) nebulizer solution  0.5 mg Nebulization BID  . carvedilol  3.125 mg Oral BID WC  . ceFEPime (MAXIPIME) IV  1 g Intravenous Q24H  . dextromethorphan-guaiFENesin  1 tablet Oral BID  . famotidine  20 mg Oral Daily  . insulin aspart  0-5 Units Subcutaneous QHS  . insulin aspart  0-9 Units Subcutaneous TID WC  . ipratropium-albuterol  3 mL Nebulization Q6H  . isosorbide mononitrate  30 mg Oral Daily  . methylPREDNISolone (SOLU-MEDROL) injection  60 mg Intravenous Q8H  . pantoprazole  40 mg Oral BID AC  . Rivaroxaban  15 mg Oral Q breakfast  . saccharomyces boulardii  250 mg Oral BID  . tamsulosin  0.4 mg Oral Daily  . vancomycin  1,000 mg Intravenous Q48H   Continuous Infusions:    Principal Problem:   HCAP (healthcare-associated pneumonia) Active Problems:   Hypertension   Atrial fibrillation- unknown duration   Acute renal failure superimposed on stage 3 chronic kidney disease (HCC)   Hyperlipidemia   Coronary artery disease   Chronic anticoagulation   Carotid artery disease  (HCC)   Severe sepsis (HCC)   Elevated troponin   Nausea, vomiting and diarrhea  Abdominal pain   HAP (hospital-acquired pneumonia)   Acute on chronic respiratory failure (HCC)    Time spent: 30 minutes    Vassie Loll  [facility] Pager [phone]. If 7PM-7AM, please contact night-coverage at [email or web address], password [facility] 10/06/2015, 9:24 AM  LOS: 3 days

## 2015-10-06 NOTE — Consult Note (Signed)
Name: [name] MRN: [id number] DOB: 10/21/1926    ADMISSION DATE:  10/03/2015 CONSULTATION DATE:  8/25  REFERRING MD :  Gwenlyn PerkingMadera   CHIEF COMPLAINT:  aecopd   BRIEF PATIENT DESCRIPTION:  82 y.o.malewith medical history significant of hypertension, hyperlipidemia, gout, depression, CAD, PUD, BPH, chronic combined systolic and diastolic CHF, carotid artery stenosis, AAA, anemia, PAF on Xarelto, who presented 8/23  with cough, shortness of breath, fever and left sided chest pain, coughing up yellow colored sputum. left side, constant, moderate, sharp, pleuritic. It is aggravated by coughing and deep breath. Admitted w/ working dx of HCAP and AECOPD. Had an event the afternoon on 8/25 where he was c/o shortness of breath but this resolved w/ rescue BDs and positional changes. PCCM was asked to see for concern about worsening respiratory failure  SIGNIFICANT EVENTS    STUDIES:   SUBJECTIVE:  IN med, no distress Improved since yesterday   VITAL SIGNS: Temp:  [97.3 F (36.3 C)-99.9 F (37.7 C)] 97.8 F (36.6 C) (08/26 1100) Pulse Rate:  [76-128] 95 (08/26 1100) Resp:  [18-22] 22 (08/26 1100) BP: (114-166)/(57-88) 130/86 (08/26 1100) SpO2:  [96 %-100 %] 98 % (08/26 1100) FiO2 (%):  [97 %] 97 % (08/25 2108) Weight:  [68.3 kg (150 lb 9.2 oz)] 68.3 kg (150 lb 9.2 oz) (08/25 1925) Room air  PHYSICAL EXAMINATION: General: in bed, no distress  Neuro:  Awake, alert, not english speaking  HEENT: jvd wnl even flat Cardiovascular:  s1 s2 Rrr, no MRG Lungs:  Faint exp whee resolved, cta, improved left mid lung field  Abdomen:  Soft, not tender + bowel sounds  Musculoskeletal:  Intact, equal st and bulk  Skin:  Warm and dry    Recent Labs Lab 10/03/15 1744 10/05/15 1829 10/06/15 0446  NA 128* 128* 133*  K 3.9 3.7 4.1  CL 99* 104 105  CO2 19* 17* 18*  BUN 26* 15 16  CREATININE 3.00* 1.78* 1.76*  GLUCOSE 118* 171* 195*    Recent Labs Lab 10/03/15 1744 10/06/15 0446    HGB 10.8* 9.4*  HCT 32.5* 28.2*  WBC 27.6* 12.2*  PLT 194 214   Dg Chest Port 1 View  Result Date: 10/06/2015 CLINICAL DATA:  COPD EXAM: PORTABLE CHEST 1 VIEW COMPARISON:  10/05/2015 FINDINGS: Slight improvement in opacity in the left upper lobe compatible with improving pneumonia. Mild cardiomegaly. Low lung volumes. Right base atelectasis. No visible effusions or acute bony abnormality. IMPRESSION: Low lung volumes with right base atelectasis. Improving left upper lobe pneumonia. Electronically Signed   By: Charlett NoseKevin  Dover M.D.   On: 10/06/2015 07:09   Dg Chest Port 1 View  Result Date: 10/05/2015 CLINICAL DATA:  Acute on chronic respiratory failure. EXAM: PORTABLE CHEST 1 VIEW COMPARISON:  Radiographs of October 03, 2015. FINDINGS: Stable cardiomediastinal silhouette. No pneumothorax is noted. Right lung is clear. Atherosclerosis of thoracic aorta is noted. Left upper lobe opacity is slightly increased compared to prior exam consistent with pneumonia. Bony thorax is unremarkable. IMPRESSION: Increased left upper lobe opacity is noted consistent with worsening pneumonia. Continued radiographic follow-up is recommended. Aortic atherosclerosis. Electronically Signed   By: Lupita RaiderJames  Green Jr, M.D.   On: 10/05/2015 16:12    ASSESSMENT / PLAN:  AECOPD with exacerbation HCAP vs CAP Gastroenteritis  Anxiety  GERD afib Acute on chronic renal failure  BPH HTN  AECOPD in setting of CAP Vs HCAP (LUL).   Has had significant chronic dyspnea for > 20 yrs. Stopped smoking about  17 yrs ago. Still active, but breathing limits day to day ADLs. He is in here w/ what seems to be CAP vs HCAP involving the LUL. Developed acute shortness of breath this afternoon which was associated w/ anxiety, moaning and possibly wheezing which has all resolved. At any rate he is a frail 82 year old man w/ COPD and now a PNA. Had an excellent d/w his daughter who speaks on his behalf. He would not want mechanical ventilation  should he ever get worse to the point he would need life support. Based on our discussion she has agreed that he would want to be DNR and not want ETT. He would try NIPPV for short term (although I suspect w/ language barrier he may not tolerate this well).   Plan/rec -Cont scheduled BDs: now on Duonebs q 6 and budesonide q12.  -At d/c would change him to brovana/budesonide bid and PRN albuterol -He is improved for sure and radiographic ally the PNA is resolving and better aerated -maintain course abx x 8 days in total abx exposure -keep even -would keep steroids and reduce in am to 40 mg q12h - no pcxr needed in am with resolution and clinical progress -consider dc vanc -will revisit Monday, consider transfer back to floor  _ I updated son in room  Mcarthur Rossetti. Tyson Alias, MD, FACP Pgr: [phone] [facility] 10/06/2015 11:36 AM

## 2015-10-06 NOTE — Progress Notes (Signed)
Pharmacy Antibiotic Note  [name] is a 82 y.o. male admitted on 10/03/2015 with sepsis secondary to pneumonia.  Pharmacy has been consulted for vancomycin and cefepime dosing. Patient was on q48h dosing but with improved renal function will adjust dose. Vancomycin random returned at 12 and creatinine is 1.76.   Plan: Vancomycin 1000 mg IV q24h Cefepime 1g IV q24h Monitor C&S, CBC and renal function Monitor vancomycin trough at steady state as needed  Height: 5\' 2"  (157.5 cm) Weight: 150 lb 9.2 oz (68.3 kg) IBW/kg (Calculated) : 54.6  Temp (24hrs), Avg:98.3 F (36.8 C), Min:97.3 F (36.3 C), Max:99.6 F (37.6 C)   Recent Labs Lab 10/03/15 1744 10/03/15 2112 10/03/15 2316 10/04/15 0249 10/05/15 1829 10/06/15 0446 10/06/15 1853  WBC 27.6*  --   --   --   --  12.2*  --   CREATININE 3.00*  --   --   --  1.78* 1.76*  --   LATICACIDVEN  --  2.46* 1.8 1.5  --   --   --   VANCORANDOM  --   --   --   --   --   --  12    Estimated Creatinine Clearance: 25.1 mL/min (by C-G formula based on SCr of 1.76 mg/dL).    Allergies  Allergen Reactions  . Levofloxacin Anaphylaxis and Other (See Comments)    Patient told his daughter that it made him "feel worse than he already did" (overall) Headache also (has refused to take anymore)  . Eliquis [Apixaban] Itching and Swelling  . Phenazopyridine Hcl Other (See Comments)    Unknown reaction  . Septra [Sulfamethoxazole-Trimethoprim] Nausea And Vomiting    GI Upset  . Latex Rash  . Tape Rash    Antimicrobials this admission: Cefepime 8/23 >> Vancomycin 8/23 >> Azithromycin 8/24 >>  Dose adjustments this admission: VR - 12 (ok to adjust interval)  Microbiology results: Resp Panel 8/23 > negative GI Panel 8/24 > Enteropathogenic E. coli Urine cx 8/23 > NGF Blood cx 8/23 > ngtd C diff 8/24 > negative 8/25 MRSA PCR Neg  Thank you for allowing pharmacy to be a part of this patient's care.  Casilda Carls, PharmD. PGY-2  Pharmacy Resident Pager: [phone] 10/06/2015 7:20 PM

## 2015-10-07 LAB — GLUCOSE, CAPILLARY
GLUCOSE-CAPILLARY: 253 mg/dL — AB (ref 65–99)
GLUCOSE-CAPILLARY: 191 mg/dL — AB (ref 65–99)
Glucose-Capillary: 207 mg/dL — ABNORMAL HIGH (ref 65–99)
Glucose-Capillary: 159 mg/dL — ABNORMAL HIGH (ref 65–99)

## 2015-10-07 MED ORDER — AMOXICILLIN-POT CLAVULANATE 500-125 MG PO TABS
1.0000 | ORAL_TABLET | Freq: Two times a day (BID) | ORAL | Status: DC
  Administered 2015-10-07 – 2015-10-09 (×5): 500 mg via ORAL
  Filled 2015-10-07 (×5): qty 1

## 2015-10-07 MED ORDER — IPRATROPIUM-ALBUTEROL 0.5-2.5 (3) MG/3ML IN SOLN
3.0000 mL | Freq: Three times a day (TID) | RESPIRATORY_TRACT | Status: DC
  Administered 2015-10-07 – 2015-10-09 (×7): 3 mL via RESPIRATORY_TRACT
  Filled 2015-10-07 (×7): qty 3

## 2015-10-07 NOTE — Progress Notes (Signed)
[facility] PROGRESS NOTE  [name] ZOX:[id number] DOB: 1926-11-01 DOA: 10/03/2015 PCP: [name], MD  Interim summary and HPI 82 y.o. male with medical history significant of hypertension, hyperlipidemia, gout, depression, CAD, PUD, BPH, chronic confined systolic and diastolic CHF, carotid artery stenosis, AAA, anemia, PAF on Xarelto, who presents with cough, shortness of breath, fever and chest pain. Patient does not speak [place], he speaks [place] language. Per his son, patient has been having cough, fever, shortness of breath, chest pain for 3 days. He coughs up yellow colored sputum. His chest pain is located in the left side, constant, moderate, sharp, pleuritic. It is aggravated by coughing and deep breath. No tenderness over calf areas.  Patient also has nausea, vomiting, diarrhea, abdominal pain in the past 3 days. He vomited twice without blood in the vomitus. He had 3 bowel movements with loose stool.  Found to have an elevated lactate at 2.46, WBC 27.6, troponin 0.72, BNP 156, lipase 23, negative urinalysis, negative C. difficile PCR, worsening renal function. Chest x-ray showed infiltration in left upper lobe. Patient is admitted for further treatment of his sepsis due to HCAP.   Assessment/Plan: HCAP and sepsis/acute resp failure with hypoxia/COPD exacerbation:  Patient's pleuritic chest pain, fever, shortness of breath, productive cough, Chest x-ray findings of infiltration in left upper lobe, consistent with HCAP. Pt is septic with leukocytosis, elevated lactate, tachycardia, tachypnea.  -Will continue antibiotic therapy; but will d/c vancomycin and start augmentin  -follow culture data and clinical response -continue pulmicort and flutter valve; will also continue solumedrol TID for today and start tapering in am -PRN oxygen supplementation  -PRN analgesics for pleuritic pain -appreciate assistance and recommendations from pulmonary service -repeat CXR  demonstrating improve aeration and clearing of PNA  Dehydration and hyponatremia: -Due to poor PO intake and GI loses -has received fluid resuscitation and his dehydration status is resolved -will advance diet as tolerated; discussed with daughter and encourage patient to maintain adequate oral hydration   HTN: Blood pressure 157/84 -Continue Coreg -also on Flomax   BPH: stable - Continue Flomax -no complaints of urinary retention   Atrial Fibrillation: CHA2DS2-VASc Score is 6, needs oral anticoagulation. Patient is Xarelto at home.  -continue coreg for rate control -continue Xarelto (dose to be adjusted for renal function as needed)  Acute on chronic renal failure stage III: Baseline Cre is 1.8-1.9, pt's Cre is 3.0, BUN 26 on admission. Likely due to prerenal failure. -Follow up renal function by BMP intermittently -minimize nephrotoxic agents -after fluid resuscitation his Cr is back to baseline (1.76 currently)   HLD: Last LDL was 35 on 10/04/15 -Continue Lipitor  CAD and elevated trop: Troponin 0.72. Patient has chest pain, which is pruritic, likely due to pneumonia. Elevated troponin is likely due to demanding ischemia secondary to sepsis. - repeated EKG w/o acute ischemic changes - will continue Nitroglycerin, Morphine, aspirin, lipitor and Imdur -2-D echo w/o acute wall motion abnormalities and stable EF (from prior echo on May 2016) -given patient's age and cormobidities medically management is recommended   Chronic systolic HF: -EF 81-19% -appears compensated -will follow daily weights -strict intake/output -low sodium diet -continue B-blocker and Imdur; no ACE or ARB due to renal failure.  Nausea, vomiting and diarrhea and Abdominal pain: Lipase 23. C diff pcr are negative. Physical examination of abdomen is benign, no acute abdomen. -GI panel positive for E. Coli enteropathogenic (only needs supportive care for that kind of infection) -will continue  probiotics -patient drank some herbal laxatives  tea 2-3 days prior to admission according to family  -continue PRN antiemetics and slowly advance diet as tolerated  -reports no abd pain currently and improvement in loose stools.  GERD: -continue Pepcid IV  Code Status: DNR Family Communication: daughter at bedside  Disposition Plan: continue current antibiotic therapy, continue Pulmicort and flutter valve; will also continue solumedrol and watch for another 24 hours in stepdown. PRN BIPAP (even I feel he will not needed)  Consultants:  Pulmonary service   Procedures:  See below for x-ray   2-D echo - Left ventricle: The cavity size was normal. Wall thickness was   normal. Systolic function was mildly to moderately reduced. The   estimated ejection fraction was in the range of 40% to 45%.   Severe hypokinesis of the inferolateral and inferior myocardium;   consistent with infarction in the distribution of the right   coronary or left circumflex coronary artery. - Mitral valve: There was moderate regurgitation directed   centrally. - Left atrium: The atrium was moderately to severely dilated. - Pulmonary arteries: PA peak pressure: 32 mm Hg (S). - Pericardium, extracardiac: A small pericardial effusion was   identified along the right ventricular free wall. There was no   evidence of hemodynamic compromise.  Impressions: - LV wall motion and overall LVEF similar to the May 2016 images.  Antibiotics:  Vancomycin and cefepime 10/03/15>>10/07/15  zithromax 8/23>>  augmentin 10/07/15  HPI/Subjective: Afebrile. No CP. Reports breathing easier overall. But today had increase wheezing and decrease ability to speak in full sentences.   Objective: Vitals:   10/07/15 1100 10/07/15 1500  BP: 131/86 134/82  Pulse: (!) 111 77  Resp: (!) 28 19  Temp: 97.6 F (36.4 C) 97.5 F (36.4 C)    Intake/Output Summary (Last 24 hours) at 10/07/15 1637 Last data filed at 10/07/15  1600  Gross per 24 hour  Intake             1450 ml  Output             1025 ml  Net              425 ml   Filed Weights   10/04/15 0504 10/05/15 0532 10/05/15 1925  Weight: 67.3 kg (148 lb 4.8 oz) 68.4 kg (150 lb 14.4 oz) 68.3 kg (150 lb 9.2 oz)    Exam:   General:  Afebrile, breathing easier overall; but with increase wheezing and having difficulty speaking in full sentences today. Patient denies CP.   Cardiovascular: positive SEM, no rubs, no gallops, S1 and S2 appreciated  Respiratory: diffuse exp wheezing, no using accessory muscles, positive scattered rhonchi; improved air movement overall.  Abdomen: soft, NT, ND, positive BS  Musculoskeletal: no edema or cyanosis appreciated  Data Reviewed: Basic Metabolic Panel:  Recent Labs Lab 10/03/15 1744 10/05/15 1829 10/06/15 0446  NA 128* 128* 133*  K 3.9 3.7 4.1  CL 99* 104 105  CO2 19* 17* 18*  GLUCOSE 118* 171* 195*  BUN 26* 15 16  CREATININE 3.00* 1.78* 1.76*  CALCIUM 8.8* 8.3* 8.9    Recent Labs Lab 10/03/15 2316  LIPASE 23   CBC:  Recent Labs Lab 10/03/15 1744 10/03/15 2137 10/06/15 0446  WBC 27.6*  --  12.2*  NEUTROABS  --  23.7*  --   HGB 10.8*  --  9.4*  HCT 32.5*  --  28.2*  MCV 91.3  --  90.7  PLT 194  --  214   Cardiac  Enzymes:  Recent Labs Lab 10/03/15 2316 10/04/15 0507 10/04/15 1019  TROPONINI 0.62* 0.47* 0.35*   BNP (last 3 results)  Recent Labs  07/27/15 0609 07/31/15 1545 10/03/15 2316  BNP 71.2 39.0 156.1*   CBG:  Recent Labs Lab 10/06/15 1133 10/06/15 1741 10/06/15 2207 10/07/15 0748 10/07/15 1209  GLUCAP 217* 229* 230* 207* 253*    Recent Results (from the past 240 hour(s))  Urine culture     Status: None   Collection Time: 10/03/15  8:45 PM  Result Value Ref Range Status   Specimen Description URINE, RANDOM  Final   Special Requests NONE  Final   Culture NO GROWTH  Final   Report Status 10/05/2015 FINAL  Final  Blood Culture (routine x 2)      Status: None (Preliminary result)   Collection Time: 10/03/15  8:57 PM  Result Value Ref Range Status   Specimen Description BLOOD LEFT HAND  Final   Special Requests IN PEDIATRIC BOTTLE 3CC  Final   Culture NO GROWTH 4 DAYS  Final   Report Status PENDING  Incomplete  Blood Culture (routine x 2)     Status: None (Preliminary result)   Collection Time: 10/03/15  9:05 PM  Result Value Ref Range Status   Specimen Description BLOOD RIGHT HAND  Final   Special Requests BOTTLES DRAWN AEROBIC ONLY 5CC  Final   Culture NO GROWTH 4 DAYS  Final   Report Status PENDING  Incomplete  Respiratory Panel by PCR     Status: None   Collection Time: 10/03/15 11:40 PM  Result Value Ref Range Status   Adenovirus NOT DETECTED NOT DETECTED Final   Coronavirus 229E NOT DETECTED NOT DETECTED Final   Coronavirus HKU1 NOT DETECTED NOT DETECTED Final   Coronavirus NL63 NOT DETECTED NOT DETECTED Final   Coronavirus OC43 NOT DETECTED NOT DETECTED Final   Metapneumovirus NOT DETECTED NOT DETECTED Final   Rhinovirus / Enterovirus NOT DETECTED NOT DETECTED Final   Influenza A NOT DETECTED NOT DETECTED Final   Influenza B NOT DETECTED NOT DETECTED Final   Parainfluenza Virus 1 NOT DETECTED NOT DETECTED Final   Parainfluenza Virus 2 NOT DETECTED NOT DETECTED Final   Parainfluenza Virus 3 NOT DETECTED NOT DETECTED Final   Parainfluenza Virus 4 NOT DETECTED NOT DETECTED Final   Respiratory Syncytial Virus NOT DETECTED NOT DETECTED Final   Bordetella pertussis NOT DETECTED NOT DETECTED Final   Chlamydophila pneumoniae NOT DETECTED NOT DETECTED Final   Mycoplasma pneumoniae NOT DETECTED NOT DETECTED Final  C difficile quick screen w PCR reflex     Status: None   Collection Time: 10/04/15  1:53 AM  Result Value Ref Range Status   C Diff antigen NEGATIVE NEGATIVE Final   C Diff toxin NEGATIVE NEGATIVE Final   C Diff interpretation No C. difficile detected.  Final  Gastrointestinal Panel by PCR , Stool     Status:  Abnormal   Collection Time: 10/04/15  1:53 AM  Result Value Ref Range Status   Campylobacter species NOT DETECTED NOT DETECTED Final   Plesimonas shigelloides NOT DETECTED NOT DETECTED Final   Salmonella species NOT DETECTED NOT DETECTED Final   Yersinia enterocolitica NOT DETECTED NOT DETECTED Final   Vibrio species NOT DETECTED NOT DETECTED Final   Vibrio cholerae NOT DETECTED NOT DETECTED Final   Enteroaggregative E coli (EAEC) NOT DETECTED NOT DETECTED Final   Enteropathogenic E coli (EPEC) DETECTED (A) NOT DETECTED Final    Comment: CRITICAL RESULT CALLED TO,  READ BACK BY AND VERIFIED WITH: ANNIE MAGBATANG AT 1554 10/04/15 SDR    Enterotoxigenic E coli (ETEC) NOT DETECTED NOT DETECTED Final   Shiga like toxin producing E coli (STEC) NOT DETECTED NOT DETECTED Final   E. coli O157 NOT DETECTED NOT DETECTED Final   Shigella/Enteroinvasive E coli (EIEC) NOT DETECTED NOT DETECTED Final   Cryptosporidium NOT DETECTED NOT DETECTED Final   Cyclospora cayetanensis NOT DETECTED NOT DETECTED Final   Entamoeba histolytica NOT DETECTED NOT DETECTED Final   Giardia lamblia NOT DETECTED NOT DETECTED Final   Adenovirus F40/41 NOT DETECTED NOT DETECTED Final   Astrovirus NOT DETECTED NOT DETECTED Final   Norovirus GI/GII NOT DETECTED NOT DETECTED Final   Rotavirus A NOT DETECTED NOT DETECTED Final   Sapovirus (I, II, IV, and V) NOT DETECTED NOT DETECTED Final  MRSA PCR Screening     Status: None   Collection Time: 10/05/15  7:32 PM  Result Value Ref Range Status   MRSA by PCR NEGATIVE NEGATIVE Final    Comment:        The GeneXpert MRSA Assay (FDA approved for NASAL specimens only), is one component of a comprehensive MRSA colonization surveillance program. It is not intended to diagnose MRSA infection nor to guide or monitor treatment for MRSA infections.      Studies: Dg Chest Port 1 View  Result Date: 10/06/2015 CLINICAL DATA:  COPD EXAM: PORTABLE CHEST 1 VIEW COMPARISON:   10/05/2015 FINDINGS: Slight improvement in opacity in the left upper lobe compatible with improving pneumonia. Mild cardiomegaly. Low lung volumes. Right base atelectasis. No visible effusions or acute bony abnormality. IMPRESSION: Low lung volumes with right base atelectasis. Improving left upper lobe pneumonia. Electronically Signed   By: Charlett Nose M.D.   On: 10/06/2015 07:09    Scheduled Meds: . allopurinol  300 mg Oral Daily  . amoxicillin-clavulanate  1 tablet Oral Q12H  . aspirin EC  81 mg Oral Daily  . atorvastatin  40 mg Oral q1800  . azithromycin  500 mg Oral QHS  . budesonide (PULMICORT) nebulizer solution  0.5 mg Nebulization BID  . carvedilol  3.125 mg Oral BID WC  . dextromethorphan-guaiFENesin  1 tablet Oral BID  . famotidine  20 mg Oral Daily  . insulin aspart  0-5 Units Subcutaneous QHS  . insulin aspart  0-9 Units Subcutaneous TID WC  . ipratropium-albuterol  3 mL Nebulization TID  . isosorbide mononitrate  30 mg Oral Daily  . methylPREDNISolone (SOLU-MEDROL) injection  60 mg Intravenous Q12H  . pantoprazole  40 mg Oral BID AC  . Rivaroxaban  15 mg Oral Q breakfast  . saccharomyces boulardii  250 mg Oral BID  . tamsulosin  0.4 mg Oral Daily   Continuous Infusions: . sodium chloride 50 mL/hr at 10/07/15 1544    Principal Problem:   HCAP (healthcare-associated pneumonia) Active Problems:   Hypertension   Atrial fibrillation- unknown duration   Acute renal failure superimposed on stage 3 chronic kidney disease (HCC)   Hyperlipidemia   Coronary artery disease   Chronic anticoagulation   Carotid artery disease (HCC)   Severe sepsis (HCC)   Elevated troponin   Nausea, vomiting and diarrhea   Abdominal pain   HAP (hospital-acquired pneumonia)   Acute on chronic respiratory failure (HCC)    Time spent: 30 minutes    Vassie Loll  [facility] Pager [phone]. If 7PM-7AM, please contact night-coverage at [email or web address], password [facility] 10/07/2015,  4:37 PM  LOS: 4 days

## 2015-10-08 DIAGNOSIS — R0902 Hypoxemia: Secondary | ICD-10-CM

## 2015-10-08 DIAGNOSIS — J441 Chronic obstructive pulmonary disease with (acute) exacerbation: Secondary | ICD-10-CM

## 2015-10-08 DIAGNOSIS — J9811 Atelectasis: Secondary | ICD-10-CM

## 2015-10-08 DIAGNOSIS — J449 Chronic obstructive pulmonary disease, unspecified: Secondary | ICD-10-CM

## 2015-10-08 LAB — BASIC METABOLIC PANEL
ANION GAP: 9 (ref 5–15)
BUN: 36 mg/dL — AB (ref 6–20)
CALCIUM: 8.5 mg/dL — AB (ref 8.9–10.3)
CO2: 16 mmol/L — ABNORMAL LOW (ref 22–32)
Chloride: 107 mmol/L (ref 101–111)
Creatinine, Ser: 1.88 mg/dL — ABNORMAL HIGH (ref 0.61–1.24)
GFR calc Af Amer: 35 mL/min — ABNORMAL LOW (ref >60)
GFR, EST NON AFRICAN AMERICAN: 31 mL/min — AB (ref >60)
GLUCOSE: 201 mg/dL — AB (ref 65–99)
POTASSIUM: 3.7 mmol/L (ref 3.5–5.1)
SODIUM: 132 mmol/L — AB (ref 135–145)

## 2015-10-08 LAB — GLUCOSE, CAPILLARY
GLUCOSE-CAPILLARY: 211 mg/dL — AB (ref 65–99)
GLUCOSE-CAPILLARY: 314 mg/dL — AB (ref 65–99)
GLUCOSE-CAPILLARY: 170 mg/dL — AB (ref 65–99)
GLUCOSE-CAPILLARY: 259 mg/dL — AB (ref 65–99)

## 2015-10-08 MED ORDER — PNEUMOCOCCAL VAC POLYVALENT 25 MCG/0.5ML IJ INJ
0.5000 mL | INJECTION | INTRAMUSCULAR | Status: DC
  Filled 2015-10-08: qty 0.5

## 2015-10-08 MED ORDER — METHYLPREDNISOLONE SODIUM SUCC 40 MG IJ SOLR
40.0000 mg | Freq: Two times a day (BID) | INTRAMUSCULAR | Status: DC
  Administered 2015-10-08 – 2015-10-09 (×3): 40 mg via INTRAVENOUS
  Filled 2015-10-08 (×3): qty 1

## 2015-10-08 MED ORDER — ALLOPURINOL 100 MG PO TABS
200.0000 mg | ORAL_TABLET | Freq: Every day | ORAL | Status: DC
  Administered 2015-10-09: 200 mg via ORAL
  Filled 2015-10-08: qty 2

## 2015-10-08 NOTE — Progress Notes (Signed)
Report called to RN. Pt to transfer to [id number] via w/c with belongings and family.

## 2015-10-08 NOTE — ED Provider Notes (Signed)
[facility]-EMERGENCY DEPT Provider Note   CSN: [id number] Arrival date & time: 10/03/15  1736     History   Chief Complaint Chief Complaint  Patient presents with  . Chest Pain    HPI [name] is a 82 y.o. male.  HPI  82 y.o. male with medical history significant of CAD, CHF, carotid artery stenosis, AAA, anemia, PAF on Xarelto, who presents with cough, shortness of breath, fever and chest pain.  Patient does not speak [place], daughter provides history. Patient has been having cough, fever, shortness of breath, chest pain for 3 days. He coughs up yellow colored sputum. His chest pain is located in the left side, constant, moderate, sharp, worse with inspiration and pt is feeling short of breath. It is aggravated by coughing and deep breath.    Past Medical History:  Diagnosis Date  . AAA (abdominal aortic aneurysm) (HCC)   . Anemia   . Asthma   . Carotid artery disease (HCC)   . CHF (congestive heart failure) (HCC)   . Coronary artery disease   . Depression   . Gout   . H. pylori infection   . Hard of hearing   . Hiatal hernia   . Hyperplasia, prostate   . Hypertension   . PUD (peptic ulcer disease)   . Renal failure, acute (HCC) 11/08/2012    Patient Active Problem List   Diagnosis Date Noted  . COPD exacerbation (HCC)   . Hypoxemia   . Atelectasis   . HAP (hospital-acquired pneumonia)   . Acute on chronic respiratory failure (HCC)   . Severe sepsis (HCC) 10/03/2015  . HCAP (healthcare-associated pneumonia) 10/03/2015  . Elevated troponin 10/03/2015  . Nausea, vomiting and diarrhea 10/03/2015  . Abdominal pain 10/03/2015  . Pneumonia 07/27/2015  . CKD (chronic kidney disease), stage III 07/27/2015  . Carotid artery disease (HCC) 07/27/2015  . CAP (community acquired pneumonia) 07/27/2015  . Weakness generalized 06/18/2014  . Hyponatremia 06/18/2014  . Dizziness 06/18/2014  . Dizzy 06/18/2014  . Pain in the chest   . Paroxysmal atrial  fibrillation (HCC) 11/16/2013  . Diarrhea 11/16/2013  . Vomiting 11/16/2013  . Urticaria 11/16/2013  . Ischemic cardiomyopathy 11/16/2013  . Headache 11/16/2013  . Chronic anticoagulation 11/16/2013  . Neck pain on left side 11/16/2013  . Acute chest pain 11/16/2013  . Chest pain at rest 11/16/2013  . Coronary artery disease 10/14/2013  . Chronic systolic heart failure, echocardiogram March 2015: ejection fraction 35%,  10/14/2013  . AAA (abdominal aortic aneurysm) without rupture (HCC) 09/08/2013  . Hyperlipidemia 06/08/2013  . CVA (cerebral infarction) 05/02/2013  . Dyspnea 04/29/2013  . Acute CHF- presume secondary to AF and diastolic dysfunction. (Nl LVF 2010) 04/29/2013  . Acute renal failure superimposed on stage 3 chronic kidney disease (HCC) 04/29/2013  . PVD (peripheral vascular disease) 3.5cm AAA Aug 2014 04/29/2013  . Atrial fibrillation- unknown duration 04/28/2013  . NSTEMI - ? type 2 - Troponin 0.63 04/28/2013  . Chronic bilateral lower abdominal pain 02/17/2013  . Hypertension     Past Surgical History:  Procedure Laterality Date  . CAROTID STENT INSERTION  2015  . CAROTID STENT INSERTION N/A 05/09/2013   Procedure: CAROTID STENT INSERTION;  Surgeon: Runell Gess, MD;  Location: [facility] CATH LAB;  Service: Cardiovascular;  Laterality: N/A;  . ESOPHAGOGASTRODUODENOSCOPY  06/13/2008,12/31/12  . LEFT HEART CATHETERIZATION WITH CORONARY ANGIOGRAM N/A 05/04/2013   Procedure: LEFT HEART CATHETERIZATION WITH CORONARY ANGIOGRAM;  Surgeon: Peter M Swaz[place]and, MD;  Location: [facility]  CATH LAB;  Service: Cardiovascular;  Laterality: N/A;       Home Medications    Prior to Admission medications   Medication Sig Start Date End Date Taking? Authorizing Provider  acetaminophen (TYLENOL) 325 MG tablet Take 325-650 mg by mouth every 6 (six) hours as needed for fever.   Yes Historical Provider, MD  allopurinol (ZYLOPRIM) 300 MG tablet Take 300 mg by mouth daily. To prevent gout   Yes  Historical Provider, MD  aspirin EC 81 MG tablet Take 1 tablet (81 mg total) by mouth daily. 09/08/13  Yes Marvel PlanJindong Xu, MD  atorvastatin (LIPITOR) 40 MG tablet Take 1 tablet (40 mg total) by mouth daily at 6 PM. 05/11/13  Yes Brittainy Sherlynn CarbonM Simmons, PA-C  carvedilol (COREG) 3.125 MG tablet Take 1 tablet (3.125 mg total) by mouth 2 (two) times daily with a meal. 06/19/14  Yes Vassie Lollarlos Madera, MD  esomeprazole (NEXIUM) 40 MG capsule Take 1 capsule (40 mg total) by mouth daily. 07/31/15  Yes Margarita Grizzleanielle Ray, MD  ipratropium-albuterol (DUONEB) 0.5-2.5 (3) MG/3ML SOLN Take 3 mLs by nebulization 2 (two) times daily. May use 1 additional dose during the day if needed   Yes Historical Provider, MD  isosorbide mononitrate (IMDUR) 30 MG 24 hr tablet Take 30 mg by mouth once daily Patient taking differently: Take 30 mg by mouth daily. For control of heart pain 07/31/15  Yes Margarita Grizzleanielle Ray, MD  nitroGLYCERIN (NITROSTAT) 0.4 MG SL tablet Place 1 tablet (0.4 mg total) under the tongue every 5 (five) minutes x 3 doses as needed for chest pain. 05/11/13  Yes Brittainy Sherlynn CarbonM Simmons, PA-C  Rivaroxaban (XARELTO) 15 MG TABS tablet Take 15 mg by mouth daily. To prevent strokes   Yes Historical Provider, MD  tamsulosin (FLOMAX) 0.4 MG CAPS capsule Take 0.4 mg by mouth daily. To improve bladder function   Yes Historical Provider, MD    Family History Family History  Problem Relation Age of Onset  . Colon cancer Neg Hx     Social History Social History  Substance Use Topics  . Smoking status: Former Smoker    Quit date: 02/17/2002  . Smokeless tobacco: Never Used     Comment: Quit seven years ago   . Alcohol use No     Allergies   Levofloxacin; Eliquis [apixaban]; Phenazopyridine hcl; Septra [sulfamethoxazole-trimethoprim]; Latex; and Tape   Review of Systems Review of Systems  ROS 10 Systems reviewed and are negative for acute change except as noted in the HPI.    Physical Exam Updated Vital Signs BP 135/70 (BP  Location: Right Arm)   Pulse 83   Temp 98.3 F (36.8 C) (Oral)   Resp 18   Ht 5\' 2"  (1.575 m)   Wt 154 lb 8 oz (70.1 kg)   SpO2 98%   BMI 28.26 kg/m   Physical Exam  Constitutional: He is oriented to person, place, and time. He appears well-developed.  HENT:  Head: Atraumatic.  Neck: Neck supple.  Cardiovascular:  tachycardia  Pulmonary/Chest: Effort normal.  Diminished L sided breath sounds. Tachypnea, retractions, accessory muscle use.  Neurological: He is alert and oriented to person, place, and time.  Skin: Skin is warm.  Nursing note and vitals reviewed.    ED Treatments / Results  Labs (all labs ordered are listed, but only abnormal results are displayed) Labs Reviewed  GASTROINTESTINAL PANEL BY PCR, STOOL (REPLACES STOOL CULTURE) - Abnormal; Notable for the following:       Result Value  Enteropathogenic E coli (EPEC) DETECTED (*)    All other components within normal limits  BASIC METABOLIC PANEL - Abnormal; Notable for the following:    Sodium 128 (*)    Chloride 99 (*)    CO2 19 (*)    Glucose, Bld 118 (*)    BUN 26 (*)    Creatinine, Ser 3.00 (*)    Calcium 8.8 (*)    GFR calc non Af Amer 17 (*)    GFR calc Af Amer 20 (*)    All other components within normal limits  CBC - Abnormal; Notable for the following:    WBC 27.6 (*)    RBC 3.56 (*)    Hemoglobin 10.8 (*)    HCT 32.5 (*)    All other components within normal limits  URINALYSIS, ROUTINE W REFLEX MICROSCOPIC (NOT AT [facility]) - Abnormal; Notable for the following:    Color, Urine AMBER (*)    Hgb urine dipstick SMALL (*)    Bilirubin Urine SMALL (*)    Ketones, ur 15 (*)    Protein, ur 100 (*)    All other components within normal limits  DIFFERENTIAL - Abnormal; Notable for the following:    Neutro Abs 23.7 (*)    Monocytes Absolute 1.4 (*)    All other components within normal limits  URINE MICROSCOPIC-ADD ON - Abnormal; Notable for the following:    Squamous Epithelial / LPF 0-5 (*)     Bacteria, UA RARE (*)    Casts HYALINE CASTS (*)    All other components within normal limits  BRAIN NATRIURETIC PEPTIDE - Abnormal; Notable for the following:    B Natriuretic Peptide 156.1 (*)    All other components within normal limits  HEMOGLOBIN A1C - Abnormal; Notable for the following:    Hgb A1c MFr Bld 6.9 (*)    All other components within normal limits  LIPID PANEL - Abnormal; Notable for the following:    HDL 33 (*)    All other components within normal limits  TROPONIN I - Abnormal; Notable for the following:    Troponin I 0.62 (*)    All other components within normal limits  TROPONIN I - Abnormal; Notable for the following:    Troponin I 0.47 (*)    All other components within normal limits  TROPONIN I - Abnormal; Notable for the following:    Troponin I 0.35 (*)    All other components within normal limits  PROTIME-INR - Abnormal; Notable for the following:    Prothrombin Time 20.6 (*)    All other components within normal limits  APTT - Abnormal; Notable for the following:    aPTT 71 (*)    All other components within normal limits  BASIC METABOLIC PANEL - Abnormal; Notable for the following:    Sodium 133 (*)    CO2 18 (*)    Glucose, Bld 195 (*)    Creatinine, Ser 1.76 (*)    GFR calc non Af Amer 33 (*)    GFR calc Af Amer 38 (*)    All other components within normal limits  CBC - Abnormal; Notable for the following:    WBC 12.2 (*)    RBC 3.11 (*)    Hemoglobin 9.4 (*)    HCT 28.2 (*)    All other components within normal limits  COMPREHENSIVE METABOLIC PANEL - Abnormal; Notable for the following:    Sodium 128 (*)    CO2 17 (*)  Glucose, Bld 171 (*)    Creatinine, Ser 1.78 (*)    Calcium 8.3 (*)    Total Protein 5.9 (*)    Albumin 2.3 (*)    ALT 12 (*)    Total Bilirubin 1.5 (*)    GFR calc non Af Amer 33 (*)    GFR calc Af Amer 38 (*)    All other components within normal limits  GLUCOSE, CAPILLARY - Abnormal; Notable for the following:      Glucose-Capillary 162 (*)    All other components within normal limits  GLUCOSE, CAPILLARY - Abnormal; Notable for the following:    Glucose-Capillary 217 (*)    All other components within normal limits  GLUCOSE, CAPILLARY - Abnormal; Notable for the following:    Glucose-Capillary 229 (*)    All other components within normal limits  GLUCOSE, CAPILLARY - Abnormal; Notable for the following:    Glucose-Capillary 230 (*)    All other components within normal limits  GLUCOSE, CAPILLARY - Abnormal; Notable for the following:    Glucose-Capillary 207 (*)    All other components within normal limits  GLUCOSE, CAPILLARY - Abnormal; Notable for the following:    Glucose-Capillary 253 (*)    All other components within normal limits  GLUCOSE, CAPILLARY - Abnormal; Notable for the following:    Glucose-Capillary 191 (*)    All other components within normal limits  BASIC METABOLIC PANEL - Abnormal; Notable for the following:    Sodium 132 (*)    CO2 16 (*)    Glucose, Bld 201 (*)    BUN 36 (*)    Creatinine, Ser 1.88 (*)    Calcium 8.5 (*)    GFR calc non Af Amer 31 (*)    GFR calc Af Amer 35 (*)    All other components within normal limits  GLUCOSE, CAPILLARY - Abnormal; Notable for the following:    Glucose-Capillary 159 (*)    All other components within normal limits  GLUCOSE, CAPILLARY - Abnormal; Notable for the following:    Glucose-Capillary 211 (*)    All other components within normal limits  GLUCOSE, CAPILLARY - Abnormal; Notable for the following:    Glucose-Capillary 314 (*)    All other components within normal limits  GLUCOSE, CAPILLARY - Abnormal; Notable for the following:    Glucose-Capillary 170 (*)    All other components within normal limits  GLUCOSE, CAPILLARY - Abnormal; Notable for the following:    Glucose-Capillary 259 (*)    All other components within normal limits  I-STAT TROPOININ, ED - Abnormal; Notable for the following:    Troponin i, poc 0.72  (*)    All other components within normal limits  I-STAT CG4 LACTIC ACID, ED - Abnormal; Notable for the following:    Lactic Acid, Venous 2.46 (*)    All other components within normal limits  CULTURE, BLOOD (ROUTINE X 2)  CULTURE, BLOOD (ROUTINE X 2)  URINE CULTURE  RESPIRATORY PANEL BY PCR  C DIFFICILE QUICK SCREEN W PCR REFLEX  MRSA PCR SCREENING  LIPASE, BLOOD  CREATININE, URINE, RANDOM  SODIUM, URINE, RANDOM  LACTIC ACID, PLASMA  PROCALCITONIN  HIV ANTIBODY (ROUTINE TESTING)  STREP PNEUMONIAE URINARY ANTIGEN  LEGIONELLA PNEUMOPHILA SEROGP 1 UR AG  LACTIC ACID, PLASMA  VANCOMYCIN, RANDOM  I-STAT CG4 LACTIC ACID, ED    EKG  EKG Interpretation  Date/Time:  Wednesday October 03 2015 17:40:54 EDT Ventricular Rate:  117 PR Interval:    QRS Duration:  96 QT Interval:  314 QTC Calculation: 438 R Axis:   -40 Text Interpretation:  Sinus tachycardia Left axis deviation Inferior infarct , age undetermined Abnormal ECG No acute changes Confirmed by Rhunette Croft, MD, Janey Genta [phone]) on 10/03/2015 6:25:48 PM Also confirmed by Rhunette Croft, MD, Janey Genta [phone]), editor [place] [address], Jola Babinski [phone])  on 10/04/2015 7:40:09 AM       Radiology No results found.  Procedures .Critical Care Performed by: Derwood Kaplan Authorized by: Derwood Kaplan   Critical care provider statement:    Critical care time (minutes):  50   Critical care time was exclusive of:  Separately billable procedures and treating other patients   Critical care was necessary to treat or prevent imminent or life-threatening deterioration of the following conditions:  Sepsis   Critical care was time spent personally by me on the following activities:  Blood draw for specimens, development of treatment plan with patient or surrogate, discussions with consultants, examination of patient, evaluation of patient's response to treatment, interpretation of cardiac output measurements, obtaining history from patient or surrogate,  ordering and performing treatments and interventions, ordering and review of laboratory studies, ordering and review of radiographic studies, pulse oximetry and re-evaluation of patient's condition   I assumed direction of critical care for this patient from another provider in my specialty: no     (including critical care time)  Medications Ordered in ED Medications  isosorbide mononitrate (IMDUR) 24 hr tablet 30 mg (30 mg Oral Given 10/08/15 0843)  carvedilol (COREG) tablet 3.125 mg (3.125 mg Oral Given 10/08/15 1825)  Rivaroxaban (XARELTO) tablet 15 mg (15 mg Oral Given 10/08/15 0844)  tamsulosin (FLOMAX) capsule 0.4 mg (0.4 mg Oral Given 10/08/15 0843)  aspirin EC tablet 81 mg (81 mg Oral Given 10/08/15 0844)  atorvastatin (LIPITOR) tablet 40 mg (40 mg Oral Given 10/08/15 1825)  nitroGLYCERIN (NITROSTAT) SL tablet 0.4 mg (not administered)  dextromethorphan-guaiFENesin (MUCINEX DM) 30-600 MG per 12 hr tablet 1 tablet (1 tablet Oral Given 10/08/15 2132)  morphine 2 MG/ML injection 1 mg (1 mg Intravenous Given 10/04/15 1150)  ondansetron (ZOFRAN) injection 4 mg (not administered)  acetaminophen (TYLENOL) tablet 650 mg (650 mg Oral Given 10/04/15 2254)  saccharomyces boulardii (FLORASTOR) capsule 250 mg (250 mg Oral Given 10/08/15 2132)  azithromycin (ZITHROMAX) tablet 500 mg (500 mg Oral Given 10/08/15 2132)  0.9 %  sodium chloride infusion (1,000 mLs Intravenous Restarted 10/05/15 1548)  budesonide (PULMICORT) nebulizer solution 0.5 mg (0.5 mg Nebulization Given 10/08/15 1939)  pantoprazole (PROTONIX) EC tablet 40 mg (40 mg Oral Given 10/08/15 1825)  insulin aspart (novoLOG) injection 0-5 Units (3 Units Subcutaneous Given 10/08/15 2132)  0.9 %  sodium chloride infusion ( Intravenous New Bag/Given 10/08/15 1053)  insulin aspart (novoLOG) injection 0-9 Units (2 Units Subcutaneous Given 10/08/15 1826)  ipratropium-albuterol (DUONEB) 0.5-2.5 (3) MG/3ML nebulizer solution 3 mL (3 mLs Nebulization Given  10/08/15 1939)  amoxicillin-clavulanate (AUGMENTIN) 500-125 MG per tablet 500 mg (500 mg Oral Given 10/08/15 2132)  allopurinol (ZYLOPRIM) tablet 200 mg (not administered)  pneumococcal 23 valent vaccine (PNU-IMMUNE) injection 0.5 mL (not administered)  methylPREDNISolone sodium succinate (SOLU-MEDROL) 40 mg/mL injection 40 mg (40 mg Intravenous Given 10/08/15 1545)  fentaNYL (SUBLIMAZE) injection 50 mcg (50 mcg Intravenous Given 10/03/15 1825)  vancomycin (VANCOCIN) 1,250 mg in sodium chloride 0.9 % 250 mL IVPB (1,250 mg Intravenous New Bag/Given 10/03/15 1954)  ceFEPIme (MAXIPIME) 2 g in dextrose 5 % 50 mL IVPB (0 g Intravenous Stopped 10/03/15 1954)     Initial Impression /  Assessment and Plan / ED Course  I have reviewed the triage vital signs and the nursing notes.  Pertinent labs & imaging results that were available during my care of the patient were reviewed by me and considered in my medical decision making (see chart for details).  Clinical Course   Pt comes in with acute hypoxic resp failure, CXR confirms concerns for PNA. Severe sepsis given elevated lactate. Pt started on broad spectrum antibiotics and fluids. Sepsis workup initiated. We will admit.   Final Clinical Impressions(s) / ED Diagnoses   Final diagnoses:  HAP (hospital-acquired pneumonia)  Severe sepsis (HCC)  Acute respiratory failure with hypoxia [facility])    New Prescriptions Current Discharge Medication List       Derwood Kaplan, MD 10/08/15 2236

## 2015-10-08 NOTE — Progress Notes (Signed)
Physical Therapy Treatment Patient Details Name: Jondavid Sinanan MRN: [id number] DOB: 30-Dec-1926 Today's Date: 10/08/2015    History of Present Illness 82 y.o. male with medical history significant of hypertension, gout, depression, CAD, PUD, chronic confined systolic and diastolic CHF, AAA, anemia, PAF on Xarelto, who presents with cough, shortness of breath, fever and chest pain. +pneumonia, +elevated troponins (believed to be demand ischemia),     PT Comments    Noting very good progress with mobility and activity tolerance; Entire session conducted on Room Air, and O2 sats remained greater than or equal to 92%  Follow Up Recommendations  Home health PT     Equipment Recommendations  Rolling walker with 5" wheels;3in1 (PT)    Recommendations for Other Services       Precautions / Restrictions Precautions Precautions: Fall Precaution Comments: daughter states no falls at home, but sometimes they have to help him walk due to weakness    Mobility  Bed Mobility Overal bed mobility: Needs Assistance Bed Mobility: Supine to Sit     Supine to sit: Supervision     General bed mobility comments: Cues to initiate  Transfers Overall transfer level: Needs assistance Equipment used: 1 person hand held assist Transfers: Sit to/from Stand Sit to Stand: Min guard         General transfer comment: Minguard for safety; no gross loss of balance at initial stand  Ambulation/Gait Ambulation/Gait assistance: Min guard;Supervision Ambulation Distance (Feet): 300 Feet Assistive device: None;Rolling walker (2 wheeled) (pushing IV pole) Gait Pattern/deviations: Step-through pattern     General Gait Details: Numerous cues to keep RW close during walk; minguard assist at first, progressing to Supervision; occasional need for cues to deep breathe   Stairs            Wheelchair Mobility    Modified Rankin (Stroke Patients Only)       Balance Overall balance  assessment: Needs assistance   Sitting balance-Leahy Scale: Good       Standing balance-Leahy Scale: Fair                      Cognition Arousal/Alertness: Awake/alert Behavior During Therapy: WFL for tasks assessed/performed Overall Cognitive Status: Difficult to assess                      Exercises      General Comments General comments (skin integrity, edema, etc.): Family present to assist with interpreting; Contacted [facility] of In[facility], and they are working to get an interpreter       Pertinent Vitals/Pain Pain Assessment: Faces Faces Pain Scale: Hurts a little bit Pain Location: bil lower ribs with cough; educated in using a pillow to splint during cough Pain Descriptors / Indicators: Discomfort Pain Intervention(s): Monitored during session;Other (comment) (taught pillow splint)    Home Living                      Prior Function            PT Goals (current goals can now be found in the care plan section) Acute Rehab PT Goals Patient Stated Goal: return home PT Goal Formulation: With patient/family Time For Goal Achievement: 10/12/15 Potential to Achieve Goals: Good Progress towards PT goals: Progressing toward goals    Frequency  Min 3X/week    PT Plan Current plan remains appropriate    Co-evaluation             End of  Session   Activity Tolerance: Patient tolerated treatment well Patient left: in chair;with call bell/phone within reach;with family/visitor present     Time: 1510-1539 PT Time Calculation (min) (ACUTE ONLY): 29 min  Charges:  $Gait Training: 23-37 mins                    G Codes:      Olen PelGarrigan, Mackena Plummer Hamff 10/08/2015, 3:50 PM   Van ClinesHolly Niajah Sipos, [place]PT  Acute Rehabilitation Services Pager [phone] Office [phone]

## 2015-10-08 NOTE — Progress Notes (Signed)
Inpatient Diabetes Program Recommendations  AACE/ADA: New Consensus Statement on Inpatient Glycemic Control (2015)  Target Ranges:  Prepandial:   less than 140 mg/dL      Peak postprandial:   less than 180 mg/dL (1-2 hours)      Critically ill patients:  140 - 180 mg/dL   Results for [name], [name] (MRN [id number]) as of 10/08/2015 12:57  Ref. Range 10/07/2015 07:48 10/07/2015 12:09 10/07/2015 16:57 10/07/2015 21:21  Glucose-Capillary Latest Ref Range: 65 - 99 mg/dL 224 (H) 825 (H) 003 (H) 159 (H)   Results for [name], [name] (MRN [id number]) as of 10/08/2015 12:57  Ref. Range 10/08/2015 07:37 10/08/2015 11:51  Glucose-Capillary Latest Ref Range: 65 - 99 mg/dL 945 (H) 038 (H)    Home DM Meds: None  Current Insulin Orders: Novolog Sensitive Correction Scale/ SSI (0-9 units) TID AC + HS     -Patient currently receiving IV Solumedrol 40 mg bid.  Dose reduced today.  -Glucose levels remain >200 mg/dl.    MD- Please consider the following in-hospital insulin adjustments while patient receiving IV Steroids:  1. Start Levemir 10 units daily (0.15 units/kg dosing)  2. Increase Novolog SSI to Novolog Moderate Correction Scale/ SSI (0-15 units) TID AC + HS      --Will follow patient during hospitalization--  Ambrose Finland RN, MSN, CDE Diabetes Coordinator Inpatient Glycemic Control Team Team Pager: [phone] (8a-5p)

## 2015-10-08 NOTE — Care Management Note (Signed)
Case Management Note  Patient Details  Name: Bayro Torell MRN: [id number] Date of Birth: 1926-09-10  Subjective/Objective:   Patient is from home with spouse, he speaks THAI, presents with HCAP/Sepsis, patient will need HHPT set up ,NCM will cont to follow for dc needs.                  Action/Plan:   Expected Discharge Date:                  Expected Discharge Plan:  Home w Home Health Services  In-House Referral:     Discharge planning Services  CM Consult  Post Acute Care Choice:    Choice offered to:     DME Arranged:    DME Agency:     HH Arranged:    HH Agency:     Status of Service:  In process, will continue to follow  If discussed at Long Length of Stay Meetings, dates discussed:    Additional Comments:  Leone Haven, RN 10/08/2015, 1:23 PM

## 2015-10-08 NOTE — Progress Notes (Signed)
Pt has prn bipap at this time.  Bipap not needed.  Rt will monitor.

## 2015-10-08 NOTE — Progress Notes (Signed)
Name: [name] MRN: [id number] DOB: 02/15/1926    ADMISSION DATE:  10/03/2015 CONSULTATION DATE:  8/25  REFERRING MD :  Gwenlyn PerkingMadera   CHIEF COMPLAINT:  aecopd   BRIEF PATIENT DESCRIPTION:  82 y.o.malewith medical history significant of hypertension, hyperlipidemia, gout, depression, CAD, PUD, BPH, chronic combined systolic and diastolic CHF, carotid artery stenosis, AAA, anemia, PAF on Xarelto, who presented 8/23  with cough, shortness of breath, fever and left sided chest pain, coughing up yellow colored sputum. left side, constant, moderate, sharp, pleuritic. It is aggravated by coughing and deep breath. Admitted w/ working dx of HCAP and AECOPD. Had an event the afternoon on 8/25 where he was c/o shortness of breath but this resolved w/ rescue BDs and positional changes. PCCM was asked to see for concern about worsening respiratory failure  SIGNIFICANT EVENTS  8/23  Admit with cough, SOB, fever.  LUL PNA 8/25  PCCM consulted for worsening resp fx  STUDIES:   SUBJECTIVE:  No acute events overnight.  Daughter at bedside - found cleaning patients penis with bleach wipes.  She reports he continues to have dry cough.     VITAL SIGNS: Temp:  [97.4 F (36.3 C)-97.9 F (36.6 C)] 97.8 F (36.6 C) (08/28 0700) Pulse Rate:  [76-88] 84 (08/28 0700) Resp:  [19-31] 25 (08/28 0700) BP: (123-143)/(64-83) 143/83 (08/28 0700) SpO2:  [95 %-99 %] 96 % (08/28 0700) FiO2 (%):  [99 %] 99 % (08/28 0833) Room air   PHYSICAL EXAMINATION: General: elderly male in NAD, lying in bed, no distress  Neuro:  Awake, alert, not english speaking  HEENT: jvd wnl, MM pink/moist Cardiovascular:  s1 s2 Rrr, no MRG Lungs:  Non-labored on RA, lungs bilaterally with wheezing  Abdomen:  Soft, not tender + bowel sounds  Musculoskeletal:  Intact, equal st and bulk  Skin:  Warm and dry    Recent Labs Lab 10/05/15 1829 10/06/15 0446 10/08/15 0314  NA 128* 133* 132*  K 3.7 4.1 3.7  CL 104 105 107  CO2  17* 18* 16*  BUN 15 16 36*  CREATININE 1.78* 1.76* 1.88*  GLUCOSE 171* 195* 201*    Recent Labs Lab 10/03/15 1744 10/06/15 0446  HGB 10.8* 9.4*  HCT 32.5* 28.2*  WBC 27.6* 12.2*  PLT 194 214   No results found.  ASSESSMENT / PLAN:  AECOPD with exacerbation HCAP vs CAP Gastroenteritis  Anxiety  GERD afib Acute on chronic renal failure  BPH HTN  AECOPD in setting of CAP Vs HCAP (LUL).   Has had significant chronic dyspnea for > 20 yrs. Stopped smoking about 17 yrs ago. Still active, but breathing limits day to day ADLs. He is in here w/ what seems to be CAP vs HCAP involving the LUL. Developed acute shortness of breath 8/25 afternoon which was associated w/ anxiety, moaning and possibly wheezing which has all resolved. At any rate he is a frail 82 year old man w/ COPD and now a PNA. Had an excellent d/w his daughter who speaks on his behalf. He would not want mechanical ventilation should he ever get worse to the point he would need life support. Based on our discussion she has agreed that he would want to be DNR and not want ETT. He would try NIPPV for short term (although suspect w/ language barrier he may not tolerate this well).   Plan: -Cont scheduled BDs: now on Duonebs q 6 and budesonide q12.  -At d/c would change him to brovana/budesonide bid and  PRN albuterol -He is improved for sure and radiographically the PNA is resolving and better aerated -maintain course abx x 8 days total  -even balance -would keep steroids, reduce to 40 mg q12h 8/28 with transition to oral prednisone  -intermittent CXR -continue pulmonary hygiene - mobilize, IS, flutter -likely could transition to floor    Educated daughter on NOT using bleach wipes on skin.     Canary Brim, NP-C [facility] Pgr: [phone] or if no answer [phone] 10/08/2015, 11:19 AM  Attending Note:  82 year old male with history of COPD, PCCM consulted for assistance with COPD management.   On exam, mild wheezing noted.  I reviewed CXR myself, R>L atelectasis.  Discussed with PCCM-NP.  COPD:  - Bronchodilators as ordered.  - Transition to oral prednisone with a stop date.  Hypoxemia:  - Titrate O2 for sat of 88-92%.  - Ambulatory desat study prior to d/c for home O2.  Atelectasis:  - IS per RT protocol.  - Ambulate.  PNA:  - Abx for total of 8 days.  PCCM will sign off, please call back if needed.  Patient seen and examined, agree with above note.  I dictated the care and orders written for this patient under my direction.  Alyson Reedy, MD [phone]

## 2015-10-08 NOTE — Progress Notes (Signed)
[facility] PROGRESS NOTE  [name] RUE:[id number] DOB: 1926/11/15 DOA: 10/03/2015 PCP: [name], MD  Interim summary and HPI 82 y.o. male with medical history significant of hypertension, hyperlipidemia, gout, depression, CAD, PUD, BPH, chronic confined systolic and diastolic CHF, carotid artery stenosis, AAA, anemia, PAF on Xarelto, who presents with cough, shortness of breath, fever and chest pain. Patient does not speak [place], he speaks [place] language. Per his son, patient has been having cough, fever, shortness of breath, chest pain for 3 days. He coughs up yellow colored sputum. His chest pain is located in the left side, constant, moderate, sharp, pleuritic. It is aggravated by coughing and deep breath. No tenderness over calf areas.  Patient also has nausea, vomiting, diarrhea, abdominal pain in the past 3 days. He vomited twice without blood in the vomitus. He had 3 bowel movements with loose stool.  Found to have an elevated lactate at 2.46, WBC 27.6, troponin 0.72, BNP 156, lipase 23, negative urinalysis, negative C. difficile PCR, worsening renal function. Chest x-ray showed infiltration in left upper lobe. Patient is admitted for further treatment of his sepsis due to HCAP.   Assessment/Plan: HCAP and sepsis/acute resp failure with hypoxia/COPD exacerbation:  Patient's pleuritic chest pain, fever, shortness of breath, productive cough, Chest x-ray findings of infiltration in left upper lobe, consistent with HCAP. Pt is septic with leukocytosis, elevated lactate, tachycardia, tachypnea.  -Will continue antibiotic therapy; augmentin and zithromax. -follow culture data and clinical response -continue pulmicort and flutter valve; will also continue solumedrol TID and slowly tapered off steroids. -PRN oxygen supplementation  -PRN analgesics for pleuritic pain -appreciate assistance and recommendations from pulmonary service -repeat CXR demonstrating improve  aeration and clearing of PNA  Dehydration and hyponatremia: -Due to poor PO intake and GI loses -has received fluid resuscitation and his dehydration status is resolved -will advance diet as tolerated; discussed with daughter and encourage patient to maintain adequate oral hydration  -Na 132  HTN: Blood pressure 157/84 -Continue Coreg -also on Flomax   BPH: stable - Continue Flomax -no complaints of urinary retention   Atrial Fibrillation: CHA2DS2-VASc Score is 6, needs oral anticoagulation. Patient is Xarelto at home.  -continue coreg for rate control -continue Xarelto (dose to be adjusted for renal function as needed)  Acute on chronic renal failure stage III: Baseline Cre is 1.8-1.9, pt's Cre is 3.0, BUN 26 on admission. Likely due to prerenal failure. -Follow up renal function by BMP intermittently -minimize nephrotoxic agents -after fluid resuscitation his Cr is back to baseline  HLD: Last LDL was 35 on 10/04/15 -Continue Lipitor  CAD and elevated trop: Troponin 0.72. Patient has chest pain, which is pruritic, likely due to pneumonia. Elevated troponin is likely due to demanding ischemia secondary to sepsis. - repeated EKG w/o acute ischemic changes - will continue Nitroglycerin, Morphine, aspirin, lipitor and Imdur -2-D echo w/o acute wall motion abnormalities and stable EF (from prior echo on May 2016) -given patient's age and cormobidities medically management is recommended   Chronic systolic HF: -EF 14-78% -appears compensated -will follow daily weights -strict intake/output -low sodium diet -continue B-blocker and Imdur; no ACE or ARB due to renal failure.  Nausea, vomiting and diarrhea and Abdominal pain: Lipase 23. C diff pcr are negative. Physical examination of abdomen is benign, no acute abdomen. -GI panel positive for E. Coli enteropathogenic (only needs supportive care for that kind of infection) -will continue probiotics -patient drank some herbal  laxatives tea 2-3 days prior to admission according to  family  -continue PRN antiemetics and slowly advance diet as tolerated  -reports no abd pain currently and improvement in loose stools.  GERD: -continue Pepcid IV  Diabetes, type 2: -treated with diet control -CBG's worse with steroids use -will use SSI  Code Status: DNR Family Communication: daughter at bedside  Disposition Plan: continue current antibiotic therapy, continue Pulmicort and flutter valve; will also continue solumedrol and transfer to the floor. Home in 24-48 hours base on clinical response.  Consultants:  Pulmonary service   Procedures:  See below for x-ray   2-D echo - Left ventricle: The cavity size was normal. Wall thickness was   normal. Systolic function was mildly to moderately reduced. The   estimated ejection fraction was in the range of 40% to 45%.   Severe hypokinesis of the inferolateral and inferior myocardium;   consistent with infarction in the distribution of the right   coronary or left circumflex coronary artery. - Mitral valve: There was moderate regurgitation directed   centrally. - Left atrium: The atrium was moderately to severely dilated. - Pulmonary arteries: PA peak pressure: 32 mm Hg (S). - Pericardium, extracardiac: A small pericardial effusion was   identified along the right ventricular free wall. There was no   evidence of hemodynamic compromise.  Impressions: - LV wall motion and overall LVEF similar to the May 2016 images.  Antibiotics:  Vancomycin and cefepime 10/03/15>>10/07/15  zithromax 8/23>>  augmentin 10/07/15  HPI/Subjective: Afebrile. No CP. Reports breathing much easier overall. Almost able to speak in full sentences and with significant improvement in his wheezing. Complaining of intercostal pain.  Objective: Vitals:   10/08/15 0354 10/08/15 0700  BP: 123/66 (!) 143/83  Pulse: 88 84  Resp: (!) 31 (!) 25  Temp: 97.9 F (36.6 C) 97.8 F (36.6 C)     Intake/Output Summary (Last 24 hours) at 10/08/15 0956 Last data filed at 10/08/15 0900  Gross per 24 hour  Intake             1530 ml  Output              550 ml  Net              980 ml   Filed Weights   10/04/15 0504 10/05/15 0532 10/05/15 1925  Weight: 67.3 kg (148 lb 4.8 oz) 68.4 kg (150 lb 14.4 oz) 68.3 kg (150 lb 9.2 oz)    Exam:   General:  Afebrile, breathing much improved today; moving more air and wheezing less. Patient able to speak almost in full sentences. Complaining of intercostal pain from coughing.   Cardiovascular: positive SEM, no rubs, no gallops, S1 and S2 appreciated  Respiratory: mild exp wheezing, no using accessory muscles, positive scattered rhonchi; improved air movement overall. No crackles appreciated  Abdomen: soft, NT, ND, positive BS  Musculoskeletal: no edema or cyanosis appreciated  Data Reviewed: Basic Metabolic Panel:  Recent Labs Lab 10/03/15 1744 10/05/15 1829 10/06/15 0446 10/08/15 0314  NA 128* 128* 133* 132*  K 3.9 3.7 4.1 3.7  CL 99* 104 105 107  CO2 19* 17* 18* 16*  GLUCOSE 118* 171* 195* 201*  BUN 26* 15 16 36*  CREATININE 3.00* 1.78* 1.76* 1.88*  CALCIUM 8.8* 8.3* 8.9 8.5*    Recent Labs Lab 10/03/15 2316  LIPASE 23   CBC:  Recent Labs Lab 10/03/15 1744 10/03/15 2137 10/06/15 0446  WBC 27.6*  --  12.2*  NEUTROABS  --  23.7*  --  HGB 10.8*  --  9.4*  HCT 32.5*  --  28.2*  MCV 91.3  --  90.7  PLT 194  --  214   Cardiac Enzymes:  Recent Labs Lab 10/03/15 2316 10/04/15 0507 10/04/15 1019  TROPONINI 0.62* 0.47* 0.35*   BNP (last 3 results)  Recent Labs  07/27/15 0609 07/31/15 1545 10/03/15 2316  BNP 71.2 39.0 156.1*   CBG:  Recent Labs Lab 10/07/15 0748 10/07/15 1209 10/07/15 1657 10/07/15 2121 10/08/15 0737  GLUCAP 207* 253* 191* 159* 211*    Recent Results (from the past 240 hour(s))  Urine culture     Status: None   Collection Time: 10/03/15  8:45 PM  Result Value Ref  Range Status   Specimen Description URINE, RANDOM  Final   Special Requests NONE  Final   Culture NO GROWTH  Final   Report Status 10/05/2015 FINAL  Final  Blood Culture (routine x 2)     Status: None (Preliminary result)   Collection Time: 10/03/15  8:57 PM  Result Value Ref Range Status   Specimen Description BLOOD LEFT HAND  Final   Special Requests IN PEDIATRIC BOTTLE 3CC  Final   Culture NO GROWTH 4 DAYS  Final   Report Status PENDING  Incomplete  Blood Culture (routine x 2)     Status: None (Preliminary result)   Collection Time: 10/03/15  9:05 PM  Result Value Ref Range Status   Specimen Description BLOOD RIGHT HAND  Final   Special Requests BOTTLES DRAWN AEROBIC ONLY 5CC  Final   Culture NO GROWTH 4 DAYS  Final   Report Status PENDING  Incomplete  Respiratory Panel by PCR     Status: None   Collection Time: 10/03/15 11:40 PM  Result Value Ref Range Status   Adenovirus NOT DETECTED NOT DETECTED Final   Coronavirus 229E NOT DETECTED NOT DETECTED Final   Coronavirus HKU1 NOT DETECTED NOT DETECTED Final   Coronavirus NL63 NOT DETECTED NOT DETECTED Final   Coronavirus OC43 NOT DETECTED NOT DETECTED Final   Metapneumovirus NOT DETECTED NOT DETECTED Final   Rhinovirus / Enterovirus NOT DETECTED NOT DETECTED Final   Influenza A NOT DETECTED NOT DETECTED Final   Influenza B NOT DETECTED NOT DETECTED Final   Parainfluenza Virus 1 NOT DETECTED NOT DETECTED Final   Parainfluenza Virus 2 NOT DETECTED NOT DETECTED Final   Parainfluenza Virus 3 NOT DETECTED NOT DETECTED Final   Parainfluenza Virus 4 NOT DETECTED NOT DETECTED Final   Respiratory Syncytial Virus NOT DETECTED NOT DETECTED Final   Bordetella pertussis NOT DETECTED NOT DETECTED Final   Chlamydophila pneumoniae NOT DETECTED NOT DETECTED Final   Mycoplasma pneumoniae NOT DETECTED NOT DETECTED Final  C difficile quick screen w PCR reflex     Status: None   Collection Time: 10/04/15  1:53 AM  Result Value Ref Range Status    C Diff antigen NEGATIVE NEGATIVE Final   C Diff toxin NEGATIVE NEGATIVE Final   C Diff interpretation No C. difficile detected.  Final  Gastrointestinal Panel by PCR , Stool     Status: Abnormal   Collection Time: 10/04/15  1:53 AM  Result Value Ref Range Status   Campylobacter species NOT DETECTED NOT DETECTED Final   Plesimonas shigelloides NOT DETECTED NOT DETECTED Final   Salmonella species NOT DETECTED NOT DETECTED Final   Yersinia enterocolitica NOT DETECTED NOT DETECTED Final   Vibrio species NOT DETECTED NOT DETECTED Final   Vibrio cholerae NOT DETECTED NOT DETECTED Final  Enteroaggregative E coli (EAEC) NOT DETECTED NOT DETECTED Final   Enteropathogenic E coli (EPEC) DETECTED (A) NOT DETECTED Final    Comment: CRITICAL RESULT CALLED TO, READ BACK BY AND VERIFIED WITH: ANNIE MAGBATANG AT 1554 10/04/15 SDR    Enterotoxigenic E coli (ETEC) NOT DETECTED NOT DETECTED Final   Shiga like toxin producing E coli (STEC) NOT DETECTED NOT DETECTED Final   E. coli O157 NOT DETECTED NOT DETECTED Final   Shigella/Enteroinvasive E coli (EIEC) NOT DETECTED NOT DETECTED Final   Cryptosporidium NOT DETECTED NOT DETECTED Final   Cyclospora cayetanensis NOT DETECTED NOT DETECTED Final   Entamoeba histolytica NOT DETECTED NOT DETECTED Final   Giardia lamblia NOT DETECTED NOT DETECTED Final   Adenovirus F40/41 NOT DETECTED NOT DETECTED Final   Astrovirus NOT DETECTED NOT DETECTED Final   Norovirus GI/GII NOT DETECTED NOT DETECTED Final   Rotavirus A NOT DETECTED NOT DETECTED Final   Sapovirus (I, II, IV, and V) NOT DETECTED NOT DETECTED Final  MRSA PCR Screening     Status: None   Collection Time: 10/05/15  7:32 PM  Result Value Ref Range Status   MRSA by PCR NEGATIVE NEGATIVE Final    Comment:        The GeneXpert MRSA Assay (FDA approved for NASAL specimens only), is one component of a comprehensive MRSA colonization surveillance program. It is not intended to diagnose MRSA infection nor  to guide or monitor treatment for MRSA infections.      Studies: No results found.  Scheduled Meds: . [START ON 10/09/2015] allopurinol  200 mg Oral Daily  . amoxicillin-clavulanate  1 tablet Oral Q12H  . aspirin EC  81 mg Oral Daily  . atorvastatin  40 mg Oral q1800  . azithromycin  500 mg Oral QHS  . budesonide (PULMICORT) nebulizer solution  0.5 mg Nebulization BID  . carvedilol  3.125 mg Oral BID WC  . dextromethorphan-guaiFENesin  1 tablet Oral BID  . insulin aspart  0-5 Units Subcutaneous QHS  . insulin aspart  0-9 Units Subcutaneous TID WC  . ipratropium-albuterol  3 mL Nebulization TID  . isosorbide mononitrate  30 mg Oral Daily  . methylPREDNISolone (SOLU-MEDROL) injection  60 mg Intravenous Q12H  . pantoprazole  40 mg Oral BID AC  . Rivaroxaban  15 mg Oral Q breakfast  . saccharomyces boulardii  250 mg Oral BID  . tamsulosin  0.4 mg Oral Daily   Continuous Infusions: . sodium chloride 50 mL/hr at 10/08/15 0600    Principal Problem:   HCAP (healthcare-associated pneumonia) Active Problems:   Hypertension   Atrial fibrillation- unknown duration   Acute renal failure superimposed on stage 3 chronic kidney disease (HCC)   Hyperlipidemia   Coronary artery disease   Chronic anticoagulation   Carotid artery disease (HCC)   Severe sepsis (HCC)   Elevated troponin   Nausea, vomiting and diarrhea   Abdominal pain   HAP (hospital-acquired pneumonia)   Acute on chronic respiratory failure (HCC)   COPD exacerbation (HCC)    Time spent: 30 minutes    Vassie Loll  [facility] Pager [phone]. If 7PM-7AM, please contact night-coverage at [email or web address], password [facility] 10/08/2015, 9:56 AM  LOS: 5 days

## 2015-10-08 NOTE — Care Management Important Message (Signed)
Important Message  Patient Details  Name: [name] MRN: [id number] Date of Birth: [birth year]   Medicare Important Message Given:  Other (see comment)    [name] 10/08/2015, 12:09 PM

## 2015-10-09 DIAGNOSIS — J9601 Acute respiratory failure with hypoxia: Secondary | ICD-10-CM

## 2015-10-09 LAB — GLUCOSE, CAPILLARY
GLUCOSE-CAPILLARY: 214 mg/dL — AB (ref 65–99)
GLUCOSE-CAPILLARY: 217 mg/dL — AB (ref 65–99)
GLUCOSE-CAPILLARY: 158 mg/dL — AB (ref 65–99)
Glucose-Capillary: 211 mg/dL — ABNORMAL HIGH (ref 65–99)

## 2015-10-09 MED ORDER — PREDNISONE 50 MG PO TABS
60.0000 mg | ORAL_TABLET | Freq: Every day | ORAL | Status: DC
  Administered 2015-10-10: 60 mg via ORAL
  Filled 2015-10-09: qty 1

## 2015-10-09 MED ORDER — IPRATROPIUM-ALBUTEROL 0.5-2.5 (3) MG/3ML IN SOLN
3.0000 mL | RESPIRATORY_TRACT | Status: DC | PRN
Start: 2015-10-09 — End: 2015-10-10

## 2015-10-09 MED ORDER — IPRATROPIUM-ALBUTEROL 0.5-2.5 (3) MG/3ML IN SOLN
3.0000 mL | Freq: Two times a day (BID) | RESPIRATORY_TRACT | Status: DC
  Administered 2015-10-09 – 2015-10-10 (×2): 3 mL via RESPIRATORY_TRACT
  Filled 2015-10-09 (×2): qty 3

## 2015-10-09 NOTE — Progress Notes (Signed)
[facility] PROGRESS NOTE  [name] XUX:[id number] DOB: [birth year] DOA: 10/03/2015 PCP: [name], MD  Interim summary and HPI 82 y.o. male with medical history significant of hypertension, hyperlipidemia, gout, depression, CAD, PUD, BPH, chronic confined systolic and diastolic CHF, carotid artery stenosis, AAA, anemia, PAF on Xarelto, who presents with cough, shortness of breath, fever and chest pain. Patient does not speak [place], he speaks [place] language. Per his son, patient has been having cough, fever, shortness of breath, chest pain for 3 days. He coughs up yellow colored sputum. His chest pain is located in the left side, constant, moderate, sharp, pleuritic. It is aggravated by coughing and deep breath. No tenderness over calf areas.  Patient also has nausea, vomiting, diarrhea, abdominal pain in the past 3 days. He vomited twice without blood in the vomitus. He had 3 bowel movements with loose stool.  Found to have an elevated lactate at 2.46, WBC 27.6, troponin 0.72, BNP 156, lipase 23, negative urinalysis, negative C. difficile PCR, worsening renal function. Chest x-ray showed infiltration in left upper lobe. Patient is admitted for further treatment of his sepsis due to HCAP.   Assessment/Plan: HCAP and sepsis/acute resp failure with hypoxia/COPD exacerbation:  Patient's pleuritic chest pain, fever, shortness of breath, productive cough, Chest x-ray findings of infiltration in left upper lobe, consistent with HCAP. Pt is septic with leukocytosis, elevated lactate, tachycardia, tachypnea.  -Will continue antibiotic therapy; augmentin and zithromax; planning to treat until 8/31 in order to complete 8 days of antibiotics. -follow culture data and clinical response (no growth up to date) -continue pulmicort and flutter valve; will start steroids tapering and continue nebulizer therapy. -PRN oxygen supplementation  -PRN analgesics for pleuritic pain -appreciate  assistance and recommendations from pulmonary service -repeated CXR demonstrating improve aeration and clearing of PNA  Dehydration and hyponatremia: -Due to poor PO intake and GI loses -has received fluid resuscitation and his dehydration status is resolved -will advance diet as tolerated; discussed with daughter and encourage patient to maintain adequate oral hydration  -last Na 132; will follow trend   HTN: -Blood pressure stable -Continue Coreg -also on Flomax   BPH: stable - Continue Flomax -no complaints of urinary retention   Atrial Fibrillation: CHA2DS2-VASc Score is 6, needs oral anticoagulation. Patient is Xarelto at home.  -continue coreg for rate control -continue Xarelto (dose to be adjusted for renal function as needed)  Acute on chronic renal failure stage III: Baseline Cre is 1.8-1.9, pt's Cre is 3.0, BUN 26 on admission. Likely due to prerenal failure. -Follow up renal function by BMP intermittently -minimize nephrotoxic agents -after fluid resuscitation his Cr is back to baseline -advise to maintain adequate hydration   HLD: Last LDL was 35 on 10/04/15 -Continue Lipitor  CAD and elevated trop: Troponin 0.72. Patient has chest pain, which is pruritic, likely due to pneumonia. Elevated troponin is likely due to demanding ischemia secondary to sepsis. - repeated EKG w/o acute ischemic changes - will continue Nitroglycerin, Morphine, aspirin, lipitor and Imdur -2-D echo w/o acute wall motion abnormalities and stable EF (from prior echo on May 2016) -given patient's age and cormobidities medically management is recommended   Chronic systolic HF: -EF 91-66% -appears compensated -will follow daily weights -strict intake/output -low sodium diet -continue B-blocker and Imdur; no ACE or ARB due to renal failure.  Nausea, vomiting and diarrhea and Abdominal pain: Lipase 23. C diff pcr are negative. Physical examination of abdomen is benign, no acute  abdomen. -GI panel positive for E.  Coli enteropathogenic (only needs supportive care for that kind of infection) -will continue probiotics -patient drank some herbal laxatives tea 2-3 days prior to admission according to family  -continue PRN antiemetics and advance diet as tolerated  -reports no abd pain currently and no further loose stools.  GERD: -continue PPI  Diabetes, type 2: -treated with diet control -CBG's worse with steroids use -will continue use SSI while inpatient -anticipating CBG's to be back to baseline once off steroids  Code Status: DNR Family Communication: daughter at bedside  Disposition Plan: continue current antibiotic therapy, continue Pulmicort and flutter valve; will also continue steroids tapering now (with transition to PO). Assess home oxygen needs on ambulation. Hopefully home in 1-2 days.  Consultants:  Pulmonary service   Procedures:  See below for x-ray   2-D echo - Left ventricle: The cavity size was normal. Wall thickness was   normal. Systolic function was mildly to moderately reduced. The   estimated ejection fraction was in the range of 40% to 45%.   Severe hypokinesis of the inferolateral and inferior myocardium;   consistent with infarction in the distribution of the right   coronary or left circumflex coronary artery. - Mitral valve: There was moderate regurgitation directed   centrally. - Left atrium: The atrium was moderately to severely dilated. - Pulmonary arteries: PA peak pressure: 32 mm Hg (S). - Pericardium, extracardiac: A small pericardial effusion was   identified along the right ventricular free wall. There was no   evidence of hemodynamic compromise.  Impressions: - LV wall motion and overall LVEF similar to the May 2016 images.  Antibiotics:  Vancomycin and cefepime 10/03/15>>10/07/15  zithromax 8/23>>8/31  augmentin 8/27>>8/31  HPI/Subjective: Afebrile. No CP. Reports breathing much easier overall and  feeling betetr. Per family able to speak in full sentences now and complaining just of intercostal muscles.  Objective: Vitals:   10/09/15 0422 10/09/15 0735  BP: (!) 147/91 (!) 162/89  Pulse: 82 87  Resp: 16 19  Temp: 98.5 F (36.9 C) 97.6 F (36.4 C)    Intake/Output Summary (Last 24 hours) at 10/09/15 1725 Last data filed at 10/09/15 1330  Gross per 24 hour  Intake             1500 ml  Output              650 ml  Net              850 ml   Filed Weights   10/05/15 0532 10/05/15 1925 10/08/15 2031  Weight: 68.4 kg (150 lb 14.4 oz) 68.3 kg (150 lb 9.2 oz) 70.1 kg (154 lb 8 oz)    Exam:   General:  Afebrile, breathing much improved today; moving more air and in no distress. Reports some intercostal muscle pain from coughing. Per family able to speak in full sentences. Currently no requiring O2 supplementation.  Cardiovascular: positive SEM, no rubs, no gallops, S1 and S2 appreciated  Respiratory: mild exp wheezing, no using accessory muscles, positive scattered rhonchi; much improved air movement overall. No crackles appreciated  Abdomen: soft, NT, ND, positive BS  Musculoskeletal: no edema or cyanosis appreciated  Data Reviewed: Basic Metabolic Panel:  Recent Labs Lab 10/03/15 1744 10/05/15 1829 10/06/15 0446 10/08/15 0314  NA 128* 128* 133* 132*  K 3.9 3.7 4.1 3.7  CL 99* 104 105 107  CO2 19* 17* 18* 16*  GLUCOSE 118* 171* 195* 201*  BUN 26* 15 16 36*  CREATININE 3.00* 1.78* 1.76*  1.88*  CALCIUM 8.8* 8.3* 8.9 8.5*    Recent Labs Lab 10/03/15 2316  LIPASE 23   CBC:  Recent Labs Lab 10/03/15 1744 10/03/15 2137 10/06/15 0446  WBC 27.6*  --  12.2*  NEUTROABS  --  23.7*  --   HGB 10.8*  --  9.4*  HCT 32.5*  --  28.2*  MCV 91.3  --  90.7  PLT 194  --  214   Cardiac Enzymes:  Recent Labs Lab 10/03/15 2316 10/04/15 0507 10/04/15 1019  TROPONINI 0.62* 0.47* 0.35*   BNP (last 3 results)  Recent Labs  07/27/15 0609 07/31/15 1545  10/03/15 2316  BNP 71.2 39.0 156.1*   CBG:  Recent Labs Lab 10/08/15 1713 10/08/15 2029 10/09/15 0733 10/09/15 1138 10/09/15 1622  GLUCAP 170* 259* 214* 217* 158*    Recent Results (from the past 240 hour(s))  Urine culture     Status: None   Collection Time: 10/03/15  8:45 PM  Result Value Ref Range Status   Specimen Description URINE, RANDOM  Final   Special Requests NONE  Final   Culture NO GROWTH  Final   Report Status 10/05/2015 FINAL  Final  Blood Culture (routine x 2)     Status: None (Preliminary result)   Collection Time: 10/03/15  8:57 PM  Result Value Ref Range Status   Specimen Description BLOOD LEFT HAND  Final   Special Requests IN PEDIATRIC BOTTLE 3CC  Final   Culture NO GROWTH 4 DAYS  Final   Report Status PENDING  Incomplete  Blood Culture (routine x 2)     Status: None (Preliminary result)   Collection Time: 10/03/15  9:05 PM  Result Value Ref Range Status   Specimen Description BLOOD RIGHT HAND  Final   Special Requests BOTTLES DRAWN AEROBIC ONLY 5CC  Final   Culture NO GROWTH 4 DAYS  Final   Report Status PENDING  Incomplete  Respiratory Panel by PCR     Status: None   Collection Time: 10/03/15 11:40 PM  Result Value Ref Range Status   Adenovirus NOT DETECTED NOT DETECTED Final   Coronavirus 229E NOT DETECTED NOT DETECTED Final   Coronavirus HKU1 NOT DETECTED NOT DETECTED Final   Coronavirus NL63 NOT DETECTED NOT DETECTED Final   Coronavirus OC43 NOT DETECTED NOT DETECTED Final   Metapneumovirus NOT DETECTED NOT DETECTED Final   Rhinovirus / Enterovirus NOT DETECTED NOT DETECTED Final   Influenza A NOT DETECTED NOT DETECTED Final   Influenza B NOT DETECTED NOT DETECTED Final   Parainfluenza Virus 1 NOT DETECTED NOT DETECTED Final   Parainfluenza Virus 2 NOT DETECTED NOT DETECTED Final   Parainfluenza Virus 3 NOT DETECTED NOT DETECTED Final   Parainfluenza Virus 4 NOT DETECTED NOT DETECTED Final   Respiratory Syncytial Virus NOT DETECTED NOT  DETECTED Final   Bordetella pertussis NOT DETECTED NOT DETECTED Final   Chlamydophila pneumoniae NOT DETECTED NOT DETECTED Final   Mycoplasma pneumoniae NOT DETECTED NOT DETECTED Final  C difficile quick screen w PCR reflex     Status: None   Collection Time: 10/04/15  1:53 AM  Result Value Ref Range Status   C Diff antigen NEGATIVE NEGATIVE Final   C Diff toxin NEGATIVE NEGATIVE Final   C Diff interpretation No C. difficile detected.  Final  Gastrointestinal Panel by PCR , Stool     Status: Abnormal   Collection Time: 10/04/15  1:53 AM  Result Value Ref Range Status   Campylobacter species NOT DETECTED  NOT DETECTED Final   Plesimonas shigelloides NOT DETECTED NOT DETECTED Final   Salmonella species NOT DETECTED NOT DETECTED Final   Yersinia enterocolitica NOT DETECTED NOT DETECTED Final   Vibrio species NOT DETECTED NOT DETECTED Final   Vibrio cholerae NOT DETECTED NOT DETECTED Final   Enteroaggregative E coli (EAEC) NOT DETECTED NOT DETECTED Final   Enteropathogenic E coli (EPEC) DETECTED (A) NOT DETECTED Final    Comment: CRITICAL RESULT CALLED TO, READ BACK BY AND VERIFIED WITH: ANNIE MAGBATANG AT 1554 10/04/15 SDR    Enterotoxigenic E coli (ETEC) NOT DETECTED NOT DETECTED Final   Shiga like toxin producing E coli (STEC) NOT DETECTED NOT DETECTED Final   E. coli O157 NOT DETECTED NOT DETECTED Final   Shigella/Enteroinvasive E coli (EIEC) NOT DETECTED NOT DETECTED Final   Cryptosporidium NOT DETECTED NOT DETECTED Final   Cyclospora cayetanensis NOT DETECTED NOT DETECTED Final   Entamoeba histolytica NOT DETECTED NOT DETECTED Final   Giardia lamblia NOT DETECTED NOT DETECTED Final   Adenovirus F40/41 NOT DETECTED NOT DETECTED Final   Astrovirus NOT DETECTED NOT DETECTED Final   Norovirus GI/GII NOT DETECTED NOT DETECTED Final   Rotavirus A NOT DETECTED NOT DETECTED Final   Sapovirus (I, II, IV, and V) NOT DETECTED NOT DETECTED Final  MRSA PCR Screening     Status: None    Collection Time: 10/05/15  7:32 PM  Result Value Ref Range Status   MRSA by PCR NEGATIVE NEGATIVE Final    Comment:        The GeneXpert MRSA Assay (FDA approved for NASAL specimens only), is one component of a comprehensive MRSA colonization surveillance program. It is not intended to diagnose MRSA infection nor to guide or monitor treatment for MRSA infections.      Studies: No results found.  Scheduled Meds: . allopurinol  200 mg Oral Daily  . amoxicillin-clavulanate  1 tablet Oral Q12H  . aspirin EC  81 mg Oral Daily  . atorvastatin  40 mg Oral q1800  . azithromycin  500 mg Oral QHS  . budesonide (PULMICORT) nebulizer solution  0.5 mg Nebulization BID  . carvedilol  3.125 mg Oral BID WC  . dextromethorphan-guaiFENesin  1 tablet Oral BID  . insulin aspart  0-5 Units Subcutaneous QHS  . insulin aspart  0-9 Units Subcutaneous TID WC  . ipratropium-albuterol  3 mL Nebulization BID  . isosorbide mononitrate  30 mg Oral Daily  . pantoprazole  40 mg Oral BID AC  . pneumococcal 23 valent vaccine  0.5 mL Intramuscular Tomorrow-1000  . [START ON 10/10/2015] predniSONE  60 mg Oral Q breakfast  . Rivaroxaban  15 mg Oral Q breakfast  . saccharomyces boulardii  250 mg Oral BID  . tamsulosin  0.4 mg Oral Daily   Continuous Infusions: . sodium chloride 50 mL/hr at 10/09/15 1610    Principal Problem:   HCAP (healthcare-associated pneumonia) Active Problems:   Hypertension   Atrial fibrillation- unknown duration   Acute renal failure superimposed on stage 3 chronic kidney disease (HCC)   Hyperlipidemia   Coronary artery disease   Chronic anticoagulation   Carotid artery disease (HCC)   Severe sepsis (HCC)   Elevated troponin   Nausea, vomiting and diarrhea   Abdominal pain   HAP (hospital-acquired pneumonia)   Acute on chronic respiratory failure (HCC)   COPD exacerbation (HCC)   Hypoxemia   Atelectasis    Time spent: 30 minutes    Vassie Loll  [facility] Pager [phone].  If 7PM-7AM, please contact night-coverage at [email or web address], password [facility] 10/09/2015, 5:25 PM  LOS: 6 days

## 2015-10-09 NOTE — Progress Notes (Signed)
Physical Therapy Treatment Patient Details Name: [name] MRN: [id number] DOB: 08/10/1926 Today's Date: 10/09/2015    History of Present Illness 82 y.o. male with medical history significant of hypertension, gout, depression, CAD, PUD, chronic confined systolic and diastolic CHF, AAA, anemia, PAF on Xarelto, who presents with cough, shortness of breath, fever and chest pain. +pneumonia, +elevated troponins (believed to be demand ischemia),     PT Comments    Continuing progress with activity tolerance and endurance; Walked with can and RW, and pt reports he doesn't have a preference; they already have a cane at home per daughter -- she requests they get a RW because at times he needs assist to walk at home, and given pt's age and current illness, this is not unreasonable  Follow Up Recommendations  Home health PT     Equipment Recommendations  Rolling walker with 5" wheels (probably youth-sized);3in1 (PT)    Recommendations for Other Services       Precautions / Restrictions Precautions Precautions: Fall Precaution Comments: daughter states no falls at home, but sometimes they have to help him walk due to weakness Restrictions Weight Bearing Restrictions: No    Mobility  Bed Mobility   Bed Mobility: Supine to Sit     Supine to sit: Supervision     General bed mobility comments: Supervision mostly for lines  Transfers Overall transfer level: Needs assistance Equipment used: 1 person hand held assist Transfers: Sit to/from Stand Sit to Stand: Min guard         General transfer comment: Minguard for safety  Ambulation/Gait Ambulation/Gait assistance: Min guard;Supervision Ambulation Distance (Feet): 150 Feet Assistive device: Straight cane;Rolling walker (2 wheeled) Gait Pattern/deviations: Step-through pattern;Decreased step length - right;Decreased step length - left;Wide base of support     General Gait Details: Cues to self-monitor for activity  tolerance; managing well with RW, requiring less cues; worked to compare walking with cane to walking with RW, pt stated he didn' tfeel any difference   Stairs            Wheelchair Mobility    Modified Rankin (Stroke Patients Only)       Balance Overall balance assessment: Needs assistance   Sitting balance-Leahy Scale: Good       Standing balance-Leahy Scale: Fair                      Cognition Arousal/Alertness: Awake/alert Behavior During Therapy: WFL for tasks assessed/performed;Impulsive (slightly) Overall Cognitive Status: Within Functional Limits for tasks assessed                      Exercises Other Exercises Other Exercises: Taught incentive spirometry, and provided pt with incentive spirometer    General Comments General comments (skin integrity, edema, etc.): Interpreter from [employer] present and assisted with session      Pertinent Vitals/Pain Pain Assessment: Faces Faces Pain Scale: Hurts even more Pain Location: bil lower ribs with coughing Pain Descriptors / Indicators: Aching;Grimacing Pain Intervention(s): Monitored during session;Other (comment) (and pillow-splinted)    Home Living Family/patient expects to be discharged to:: Private residence Living Arrangements: Spouse/significant other;Children Available Help at Discharge: Family;Available 24 hours/day Type of Home: House Home Access: Stairs to enter Entrance Stairs-Rails: None Home Layout: One level Home Equipment: Cane - single point      Prior Function Level of Independence: Needs assistance  Gait / Transfers Assistance Needed: at times walks with no device, sometimes cane, sometimes family assists ADL's /  Homemaking Assistance Needed: wife or son assists, per pt's daughter sometimes he needs help and sometimes he does not depending on if he feels weak of dizzy     PT Goals (current goals can now be found in the care plan section) Acute Rehab PT  Goals Patient Stated Goal: return home PT Goal Formulation: With patient/family Time For Goal Achievement: 10/12/15 Potential to Achieve Goals: Good Progress towards PT goals: Progressing toward goals    Frequency  Min 3X/week    PT Plan Current plan remains appropriate    Co-evaluation             End of Session Equipment Utilized During Treatment: Gait belt Activity Tolerance: Patient tolerated treatment well Patient left: in bed;with call bell/phone within reach;with family/visitor present     Time: 1011-1030 PT Time Calculation (min) (ACUTE ONLY): 19 min  Charges:  $Gait Training: 8-22 mins                    G Codes:      Olen Pel 10/09/2015, 12:40 PM   Van Clines, [place]  Acute Rehabilitation Services Pager [phone] Office [phone]

## 2015-10-09 NOTE — Progress Notes (Signed)
Inpatient Diabetes Program Recommendations  AACE/ADA: New Consensus Statement on Inpatient Glycemic Control (2015)  Target Ranges:  Prepandial:   less than 140 mg/dL      Peak postprandial:   less than 180 mg/dL (1-2 hours)      Critically ill patients:  140 - 180 mg/dL   Results for [name], [name] (MRN [id number]) as of 10/09/2015 10:25  Ref. Range 10/08/2015 07:37 10/08/2015 11:51 10/08/2015 17:13 10/08/2015 20:29 10/09/2015 07:33  Glucose-Capillary Latest Ref Range: 65 - 99 mg/dL 947 (H) 096 (H) 283 (H) 259 (H) 214 (H)   Review of Glycemic Control  Diabetes history: DM2 Outpatient Diabetes medications: None Current orders for Inpatient glycemic control: Novolog 0-9 units TID with meals, Novolog 0-5 units QHS  Inpatient Diabetes Program Recommendations: Insulin - Basal: If steroids are continued as ordered (Solumedrol 40 mg Q12H), please consider ordering low dose basal insulin. Recommend starting with Levemir 7 units daily (based on 70 kg x 0.1 units). If Levemir is ordered as recommended, please note that once steroids are tapered off Levemir will likely need to be adjusted. Insulin - Meal Coverage: If steroids are continued as ordered (Solumedrol 40 mg Q12H), please consider ordering Novolog 2 units TID with meals for meal coverage if patient eats at least 50% of meal.  Thanks, Orlando Penner, RN, MSN, CDE Diabetes Coordinator Inpatient Diabetes Program [phone] (Team Pager from 8am to 5pm) [phone] (AP office) [phone] [facility] office) [phone] [facility] office)

## 2015-10-09 NOTE — Evaluation (Signed)
Occupational Therapy Evaluation Patient Details Name: [name] MRN: [id number] DOB: 03/23/1926 Today's Date: 10/09/2015    History of Present Illness 82 y.o. male with medical history significant of hypertension, gout, depression, CAD, PUD, chronic confined systolic and diastolic CHF, AAA, anemia, PAF on Xarelto, who presents with cough, shortness of breath, fever and chest pain. +pneumonia, +elevated troponins (believed to be demand ischemia),    Clinical Impression   Pt is at sup level with ADLs and min guard A with LB ADLs and mobility. [name] states that pt's baseline in needing help sometimes with LB ADLs and toilet hygiene. Pt will have 24 hour sup and assist from family at home. All education completed and no further OT indicated at this time. Pt to continue with PT for functional mobility safety.    Follow Up Recommendations  Supervision/Assistance - 24 hour    Equipment Recommendations  3 in 1 bedside comode;Tub/shower bench    Recommendations for Other Services       Precautions / Restrictions Precautions Precautions: Fall Precaution Comments: daughter states no falls at home, but sometimes they have to help him walk due to weakness Restrictions Weight Bearing Restrictions: No      Mobility Bed Mobility               General bed mobility comments: pt sitting EOB  Transfers   Equipment used: 1 person hand held assist Transfers: Sit to/from Stand Sit to Stand: Min guard         General transfer comment: Minguard for safety    Balance Overall balance assessment: Needs assistance   Sitting balance-Leahy Scale: Good       Standing balance-Leahy Scale: Fair                              ADL Overall ADL's : Needs assistance/impaired     Grooming: Wash/dry hands;Wash/dry face;Standing;Min guard   Upper Body Bathing: Set up;Sitting   Lower Body Bathing: Sit to/from stand;Min guard;Supervison/ safety   Upper Body  Dressing : Set up   Lower Body Dressing: Min guard;Supervision/safety   Toilet Transfer: Ambulation;Comfort height toilet   Toileting- Clothing Manipulation and Hygiene: Supervision/safety       Functional mobility during ADLs: Min guard       Vision  no change from baseline          Pertinent Vitals/Pain Pain Assessment: No/denies pain     Hand Dominance Right   Extremity/Trunk Assessment Upper Extremity Assessment Upper Extremity Assessment: Generalized weakness   Lower Extremity Assessment Lower Extremity Assessment: Defer to PT evaluation   Cervical / Trunk Assessment Cervical / Trunk Assessment: Normal   Communication Communication Communication: HOH;Interpreter utilized;Prefers language other than English   Cognition Arousal/Alertness: Awake/alert Behavior During Therapy: WFL for tasks assessed/performed Overall Cognitive Status: Within Functional Limits for tasks assessed                     General Comments   Pt very pleasant and cooperative, family very supportive                 Home Living Family/patient expects to be discharged to:: Private residence Living Arrangements: Spouse/significant other;Children Available Help at Discharge: Family;Available 24 hours/day Type of Home: House Home Access: Stairs to enter Entergy CorporationEntrance Stairs-Number of Steps: 3 Entrance Stairs-Rails: None Home Layout: One level     Bathroom Shower/Tub: Tub only   FirefighterBathroom Toilet: Standard  Home Equipment: Cane - single point          Prior Functioning/Environment Level of Independence: Needs assistance  Gait / Transfers Assistance Needed: at times walks with no device, sometimes cane, sometimes family assists ADL's / Homemaking Assistance Needed: wife or son assists, per pt's daughter sometimes he needs help and sometimes he does not depending on if he feels weak of dizzy        OT Diagnosis: Generalized weakness   OT Problem List: Decreased  activity tolerance;Impaired balance (sitting and/or standing)   OT Treatment/Interventions:      OT Goals(Current goals can be found in the care plan section) Acute Rehab OT Goals Patient Stated Goal: return home OT Goal Formulation: With patient/family  OT Frequency:     Barriers to D/C:  none                        End of Session Equipment Utilized During Treatment: Gait belt  Activity Tolerance: Patient tolerated treatment well Patient left: in bed;with family/visitor present;Other (comment) (sitting EOB, interpreter also present)   Time: 6010-9323 OT Time Calculation (min): 29 min Charges:  OT General Charges $OT Visit: 1 Procedure OT Evaluation $OT Eval Moderate Complexity: 1 Procedure OT Treatments $Therapeutic Activity: 8-22 mins G-Codes:    Galen Manila 10/09/2015, 11:07 AM

## 2015-10-10 LAB — CULTURE, BLOOD (ROUTINE X 2)
CULTURE: NO GROWTH
CULTURE: NO GROWTH

## 2015-10-10 LAB — CBC
HEMATOCRIT: 27.8 % — AB (ref 39.0–52.0)
Hemoglobin: 9.4 g/dL — ABNORMAL LOW (ref 13.0–17.0)
MCH: 29.9 pg (ref 26.0–34.0)
MCHC: 33.8 g/dL (ref 30.0–36.0)
MCV: 88.5 fL (ref 78.0–100.0)
Platelets: 247 10*3/uL (ref 150–400)
RBC: 3.14 MIL/uL — AB (ref 4.22–5.81)
RDW: 15.7 % — ABNORMAL HIGH (ref 11.5–15.5)
WBC: 10.4 10*3/uL (ref 4.0–10.5)

## 2015-10-10 LAB — GLUCOSE, CAPILLARY
Glucose-Capillary: 181 mg/dL — ABNORMAL HIGH (ref 65–99)
Glucose-Capillary: 229 mg/dL — ABNORMAL HIGH (ref 65–99)

## 2015-10-10 LAB — BASIC METABOLIC PANEL
Anion gap: 8 (ref 5–15)
BUN: 33 mg/dL — AB (ref 6–20)
CHLORIDE: 107 mmol/L (ref 101–111)
CO2: 19 mmol/L — AB (ref 22–32)
CREATININE: 1.67 mg/dL — AB (ref 0.61–1.24)
Calcium: 8.4 mg/dL — ABNORMAL LOW (ref 8.9–10.3)
GFR calc non Af Amer: 35 mL/min — ABNORMAL LOW (ref >60)
GFR, EST AFRICAN AMERICAN: 41 mL/min — AB (ref >60)
Glucose, Bld: 200 mg/dL — ABNORMAL HIGH (ref 65–99)
POTASSIUM: 3.8 mmol/L (ref 3.5–5.1)
Sodium: 134 mmol/L — ABNORMAL LOW (ref 135–145)

## 2015-10-10 MED ORDER — PREDNISONE 20 MG PO TABS: ORAL_TABLET | ORAL | 0 refills | Status: DC

## 2015-10-10 MED ORDER — AZITHROMYCIN 500 MG PO TABS: 500.0000 mg | ORAL_TABLET | Freq: Every day | ORAL | 0 refills | Status: DC

## 2015-10-10 MED ORDER — BUDESONIDE 0.5 MG/2ML IN SUSP: 0.5000 mg | Freq: Two times a day (BID) | RESPIRATORY_TRACT | 12 refills | Status: DC

## 2015-10-10 MED ORDER — AMOXICILLIN-POT CLAVULANATE 500-125 MG PO TABS: 1.0000 | ORAL_TABLET | Freq: Two times a day (BID) | ORAL | 0 refills | Status: DC

## 2015-10-10 MED ORDER — ARFORMOTEROL TARTRATE 15 MCG/2ML IN NEBU: 15.0000 ug | INHALATION_SOLUTION | Freq: Two times a day (BID) | RESPIRATORY_TRACT | 2 refills | Status: DC

## 2015-10-10 MED ORDER — DM-GUAIFENESIN ER 30-600 MG PO TB12: 1.0000 | ORAL_TABLET | Freq: Two times a day (BID) | ORAL | 0 refills | Status: DC

## 2015-10-10 NOTE — Progress Notes (Signed)
Physical Therapy Treatment Patient Details Name: [name] MRN: [id number] DOB: 10/07/1926 Today's Date: 10/10/2015    History of Present Illness 82 y.o. male with medical history significant of hypertension, gout, depression, CAD, PUD, chronic confined systolic and diastolic CHF, AAA, anemia, PAF on Xarelto, who presents with cough, shortness of breath, fever and chest pain. +pneumonia, +elevated troponins (believed to be demand ischemia),     PT Comments    Continuing work and assessment of gait and mobility; stair training complete; Answered questions about transition home, and continuing activity at home; OK for dc home from PT standpoint   Follow Up Recommendations  Home health PT     Equipment Recommendations  Rolling walker with 5" wheels;3in1 (PT)    Recommendations for Other Services       Precautions / Restrictions Precautions Precautions: Fall Precaution Comments: daughter states no falls at home, but sometimes they have to help him walk due to weakness    Mobility  Bed Mobility Overal bed mobility: Modified Independent Bed Mobility: Supine to Sit              Transfers Overall transfer level: Needs assistance Equipment used: 1 person hand held assist Transfers: Sit to/from Stand Sit to Stand: Min guard         General transfer comment: Minguard for safety  Ambulation/Gait Ambulation/Gait assistance: Supervision Ambulation Distance (Feet): 150 Feet Assistive device: Straight cane Gait Pattern/deviations: Step-through pattern;Decreased step length - right;Decreased step length - left     General Gait Details: cues to self-monitor for activity tolerance; no dyspnea or coughing today; good use of straight cane -- he likes it better than his quad cane at home   Stairs Stairs: Yes Stairs assistance: Min guard Stair Management: One rail Right;Forwards;Alternating pattern Number of Stairs: 6 General stair comments: Dependent on the rail, but  Overall managing well  Wheelchair Mobility    Modified Rankin (Stroke Patients Only)       Balance     Sitting balance-Leahy Scale: Good Sitting balance - Comments: reaching outside his BOS wihtout difficulty     Standing balance-Leahy Scale: Fair                      Cognition Arousal/Alertness: Awake/alert Behavior During Therapy: WFL for tasks assessed/performed Overall Cognitive Status: Within Functional Limits for tasks assessed                      Exercises      General Comments General comments (skin integrity, edema, etc.): Interpreter from [employer]Language Resources present and assisted with session      Pertinent Vitals/Pain Pain Assessment: No/denies pain    Home Living                      Prior Function            PT Goals (current goals can now be found in the care plan section) Acute Rehab PT Goals Patient Stated Goal: return home PT Goal Formulation: With patient/family Time For Goal Achievement: 10/12/15 Potential to Achieve Goals: Good Progress towards PT goals: Progressing toward goals    Frequency  Min 3X/week    PT Plan Current plan remains appropriate    Co-evaluation             End of Session Equipment Utilized During Treatment: Gait belt Activity Tolerance: Patient tolerated treatment well Patient left: in bed;with call bell/phone within reach;with family/visitor present  Time: 2800-3491 PT Time Calculation (min) (ACUTE ONLY): 16 min  Charges:  $Gait Training: 8-22 mins                    G Codes:      Van Clines Hamff 10/10/2015, 1:00 PM  Van Clines, [place]  Acute Rehabilitation Services Pager [phone] Office [phone]

## 2015-10-10 NOTE — Discharge Summary (Signed)
Physician Discharge Summary  [name] NFA:[id number] DOB: 08/10/1926 DOA: 10/03/2015  PCP: Pamelia HoitWILSON,FRED HENRY, MD  Admit date: 10/03/2015 Discharge date: 10/10/2015  Time spent: 40 minutes  Recommendations for Outpatient Follow-up:  1. The patient will discharge home with PT OT and home health 2. Patient will complete 1 more day of by mouth antibiotics 3. Steroid taper has been Rx for patient and will need t be followed 4. Patient would benefit from outpatient repeat chest x-ray 1 months time 5. Repeat CBC plus Chem-12 about 1 month 6.  Suggest HbA1c and initiation of hypoglycemic agent if benefit felt to outweigh risk. Patient was seen by me only for one day so did not assess the same  Discharge Diagnoses:  Principal Problem:   HCAP (healthcare-associated pneumonia) Active Problems:   Hypertension   Atrial fibrillation- unknown duration   Acute renal failure superimposed on stage 3 chronic kidney disease (HCC)   Hyperlipidemia   Coronary artery disease   Chronic anticoagulation   Carotid artery disease (HCC)   Severe sepsis (HCC)   Elevated troponin   Nausea, vomiting and diarrhea   Abdominal pain   HAP (hospital-acquired pneumonia)   Acute on chronic respiratory failure (HCC)   COPD exacerbation (HCC)   Hypoxemia   Atelectasis   Discharge Condition: Improved  Diet recommendation: hh low salt  Filed Weights   10/05/15 0532 10/05/15 1925 10/08/15 2031  Weight: 68.4 kg (150 lb 14.4 oz) 68.3 kg (150 lb 9.2 oz) 70.1 kg (154 lb 8 oz)    History of present illness:  82 year old [place] male History HTN, HLD, CAD, PVD, BPH, chronic systolic and diastolic heart failure Carotid artery stenosis AAA Paroxysmal atrial fibrillation, [place] >4 [place] Xarelto Anemia Admitted to the hospital 10/03/2015 with left-sided chest pain constant sharp moderately pleuritic aggravated by coughing deep breath, also on admission had vomiting nausea diarrhea with nonradiating abdominal  pain   Found to have elevated lactate 2.4 WBC 27 and 40 care troponin elevated and chest x-ray on admission showed left upper lobe infiltrate  Pulmonology was consulted this admission because of worsening acute on chronic COPD as well as increasing secretions however patient seemed to stabilize And the left recommendations     Hospital Course:    HCAP and sepsis/acute resp failure with hypoxia/COPD exacerbation:   Pt was initially septic with leukocytosis, elevated lactate, tachycardia, tachypnea.  -Will continue antibiotic therapy; augmentin and zithromax; planning to treat until 8/31 in order to complete 8 days of antibiotics. -Blood as well as urine cultures from 10/03/2015 were negative -continue pulmicort and flutter valve;  -Steroid taper prescribed on discharge -PRN oxygen supplementation-desaturation screen performed this hospitalization -PRN analgesics for pleuritic pain -appreciate assistance and recommendations from pulmonary service -repeated CXR demonstrating improve aeration and clearing of PNA and will need further repeat chest x-ray two-view in about one month  Dehydration and hyponatremia: -Due to poor PO intake and GI loses -has received fluid resuscitation and his dehydration status is resolved  HTN: -Blood pressure stable -Continue Coreg 3.125 twice a day -also on Flomax   BPH: stable - Continue Flomax -no complaints of urinary retention   Atrial Fibrillation: CHA2DS2-VASc Scoreis 6, needs oral anticoagulation. 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  -continue coreg for rate control  Acute on chronic renal failure stage III: Baseline Cre is 1.8-1.9, pt's Cre is 3.0, BUN 26 on admission. Likely due to prerenal failure. -Follow up renal function by BMP intermittently -minimize nephrotoxic agents -after fluid resuscitation his Cr is back to baseline  ZOX:WRUE LDL was 35 on 10/04/15 -Continue Lipitor  CAD and elevated trop: Troponin 0.72. Patient has  chest pain, which is pruritic, likely due to pneumonia. Elevated troponin is likely due to demandingischemia secondary to sepsis. - repeated EKG w/o acute ischemic changes - will continue Nitroglycerin, Morphine, aspirin, lipitor and Imdur -2-D echo w/o acute wall motion abnormalities and stable EF (from prior echo on May 2016) -given patient's age and cormobidities medically management is recommended   Chronic systolic HF: -EF 45-40% -appears compensated -will follow daily weights -strict intake/output -low sodium diet -continue B-blocker and Imdur; no ACE or ARB due to renal failure.  Nausea, vomiting and diarrhea and Abdominal pain: -Lipase 23. C diff pcrare negative. -GI panel positive for E. Coli enteropathogenic (only needs supportive care for that kind of infection)  GERD: -continue PPI  Diabetes, type 2: -treated with diet control -CBG's worse with steroids use -will continue use SSI while inpatient -anticipating CBG's to be back to baseline once off steroids -No insulin nor control required at present time    Procedures: multiple chest x-rays   Consultations:  Pulmonary  Discharge Exam: Vitals:   10/09/15 2043 10/10/15 0521  BP: (!) 156/91 (!) 152/69  Pulse: 71 73  Resp: 20 (!) 22  Temp: 98 F (36.7 C) 97.5 F (36.4 C)   Alert pleasant frail oriented EOMI NCAT eyes look slightly sunken Edentulous S1-S2 no murmur rub or gallop Chest is clinically clear No added sound no obese no rhonchi no rales Mild lower extremity edema trace No rash Missing fourth finger  Discharge instructions    Current Discharge Medication List    START taking these medications   Details  amoxicillin-clavulanate (AUGMENTIN) 500-125 MG tablet Take 1 tablet (500 mg total) by mouth every 12 (twelve) hours. Qty: 4 tablet, Refills: 0    arformoterol (BROVANA) 15 MCG/2ML NEBU Take 2 mLs (15 mcg total) by nebulization 2 (two) times daily. Qty: 120 mL, Refills: 2     azithromycin (ZITHROMAX) 500 MG tablet Take 1 tablet (500 mg total) by mouth at bedtime. Qty: 2 tablet, Refills: 0    budesonide (PULMICORT) 0.5 MG/2ML nebulizer solution Take 2 mLs (0.5 mg total) by nebulization 2 (two) times daily. Qty: 2 mL, Refills: 12    dextromethorphan-guaiFENesin (MUCINEX DM) 30-600 MG 12hr tablet Take 1 tablet by mouth 2 (two) times daily. Qty: 60 tablet, Refills: 0    predniSONE (DELTASONE) 20 MG tablet Take 3 tablets by mouth for 5 days then 2 tablets by mouth for 5 days then 1 tablet by mouth for 4 days then stop. Qty: 29 tablet, Refills: 0      CONTINUE these medications which have NOT CHANGED   Details  allopurinol (ZYLOPRIM) 300 MG tablet Take 300 mg by mouth daily. To prevent gout    aspirin EC 81 MG tablet Take 1 tablet (81 mg total) by mouth daily. 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 Qty: 30 tablet, Refills: 5    carvedilol (COREG) 3.125 MG tablet Take 1 tablet (3.125 mg total) by mouth 2 (two) times daily with a meal. Qty: 60 tablet, Refills: 5    esomeprazole (NEXIUM) 40 MG capsule Take 1 capsule (40 mg total) by mouth daily. Qty: 30 capsule, Refills: 0    ipratropium-albuterol (DUONEB) 0.5-2.5 (3)  MG/3ML SOLN Take 3 mLs by nebulization 2 (two) times daily. May use 1 additional dose during the day if needed    isosorbide mononitrate (IMDUR) 30 MG 24 hr tablet Take 30 mg by mouth once daily Qty: 15 tablet, Refills: 9    nitroGLYCERIN (NITROSTAT) 0.4 MG SL tablet Place 1 tablet (0.4 mg total) under the tongue every 5 (five) minutes x 3 doses as needed for chest pain. Qty: 25 tablet, Refills: 5    Rivaroxaban (XARELTO) 15 MG TABS tablet Take 15 mg by mouth daily. To prevent strokes    tamsulosin (FLOMAX) 0.4 MG CAPS capsule Take 0.4 mg by  mouth daily. To improve bladder function      STOP taking these medications     acetaminophen (TYLENOL) 325 MG tablet        Allergies  Allergen Reactions  . Levofloxacin Anaphylaxis and Other (See Comments)    Patient told his daughter that it made him "feel worse than he already did" (overall) Headache also (has refused to take anymore)  . Eliquis [Apixaban] Itching and Swelling  . Phenazopyridine Hcl Other (See Comments)    Unknown reaction  . Septra [Sulfamethoxazole-Trimethoprim] Nausea And Vomiting    GI Upset  . Latex Rash  . Tape Rash      The results of significant diagnostics from this hospitalization (including imaging, microbiology, ancillary and laboratory) are listed below for reference.    Significant Diagnostic Studies: Dg Chest 2 View  Result Date: 10/03/2015 CLINICAL DATA:  Left-sided chest pain, shortness of breath and fever. EXAM: CHEST  2 VIEW COMPARISON:  Chest x-ray and chest CT studies on 07/31/2015 FINDINGS: Interval development of new left upper lobe infiltrate. No associated edema or pleural fluid. Mild atelectasis present at both lung bases. Stable mild cardiac enlargement. Bony structures show stable spondylosis of the thoracic spine. IMPRESSION: New left upper lobe pneumonia. Electronically Signed   By: Irish Lack M.D.   On: 10/03/2015 18:43   Dg Chest Port 1 View  Result Date: 10/06/2015 CLINICAL DATA:  COPD EXAM: PORTABLE CHEST 1 VIEW COMPARISON:  10/05/2015 FINDINGS: Slight improvement in opacity in the left upper lobe compatible with improving pneumonia. Mild cardiomegaly. Low lung volumes. Right base atelectasis. No visible effusions or acute bony abnormality. IMPRESSION: Low lung volumes with right base atelectasis. Improving left upper lobe pneumonia. Electronically Signed   By: Charlett Nose M.D.   On: 10/06/2015 07:09   Dg Chest Port 1 View  Result Date: 10/05/2015 CLINICAL DATA:  Acute on chronic respiratory failure. EXAM: PORTABLE  CHEST 1 VIEW COMPARISON:  Radiographs of October 03, 2015. FINDINGS: Stable cardiomediastinal silhouette. No pneumothorax is noted. Right lung is clear. Atherosclerosis of thoracic aorta is noted. Left upper lobe opacity is slightly increased compared to prior exam consistent with pneumonia. Bony thorax is unremarkable. IMPRESSION: Increased left upper lobe opacity is noted consistent with worsening pneumonia. Continued radiographic follow-up is recommended. Aortic atherosclerosis. Electronically Signed   By: Lupita Raider, M.D.   On: 10/05/2015 16:12    Microbiology: Recent Results (from the past 240 hour(s))  Urine culture     Status: None   Collection Time: 10/03/15  8:45 PM  Result Value Ref Range Status   Specimen Description URINE, RANDOM  Final   Special Requests NONE  Final   Culture NO GROWTH  Final   Report Status 10/05/2015 FINAL  Final  Blood Culture (routine x 2)     Status: None (Preliminary result)   Collection Time:  10/03/15  8:57 PM  Result Value Ref Range Status   Specimen Description BLOOD LEFT HAND  Final   Special Requests IN PEDIATRIC BOTTLE 3CC  Final   Culture NO GROWTH 4 DAYS  Final   Report Status PENDING  Incomplete  Blood Culture (routine x 2)     Status: None (Preliminary result)   Collection Time: 10/03/15  9:05 PM  Result Value Ref Range Status   Specimen Description BLOOD RIGHT HAND  Final   Special Requests BOTTLES DRAWN AEROBIC ONLY 5CC  Final   Culture NO GROWTH 4 DAYS  Final   Report Status PENDING  Incomplete  Respiratory Panel by PCR     Status: None   Collection Time: 10/03/15 11:40 PM  Result Value Ref Range Status   Adenovirus NOT DETECTED NOT DETECTED Final   Coronavirus 229E NOT DETECTED NOT DETECTED Final   Coronavirus HKU1 NOT DETECTED NOT DETECTED Final   Coronavirus NL63 NOT DETECTED NOT DETECTED Final   Coronavirus OC43 NOT DETECTED NOT DETECTED Final   Metapneumovirus NOT DETECTED NOT DETECTED Final   Rhinovirus / Enterovirus NOT  DETECTED NOT DETECTED Final   Influenza A NOT DETECTED NOT DETECTED Final   Influenza B NOT DETECTED NOT DETECTED Final   Parainfluenza Virus 1 NOT DETECTED NOT DETECTED Final   Parainfluenza Virus 2 NOT DETECTED NOT DETECTED Final   Parainfluenza Virus 3 NOT DETECTED NOT DETECTED Final   Parainfluenza Virus 4 NOT DETECTED NOT DETECTED Final   Respiratory Syncytial Virus NOT DETECTED NOT DETECTED Final   Bordetella pertussis NOT DETECTED NOT DETECTED Final   Chlamydophila pneumoniae NOT DETECTED NOT DETECTED Final   Mycoplasma pneumoniae NOT DETECTED NOT DETECTED Final  C difficile quick screen w PCR reflex     Status: None   Collection Time: 10/04/15  1:53 AM  Result Value Ref Range Status   C Diff antigen NEGATIVE NEGATIVE Final   C Diff toxin NEGATIVE NEGATIVE Final   C Diff interpretation No C. difficile detected.  Final  Gastrointestinal Panel by PCR , Stool     Status: Abnormal   Collection Time: 10/04/15  1:53 AM  Result Value Ref Range Status   Campylobacter species NOT DETECTED NOT DETECTED Final   Plesimonas shigelloides NOT DETECTED NOT DETECTED Final   Salmonella species NOT DETECTED NOT DETECTED Final   Yersinia enterocolitica NOT DETECTED NOT DETECTED Final   Vibrio species NOT DETECTED NOT DETECTED Final   Vibrio cholerae NOT DETECTED NOT DETECTED Final   Enteroaggregative E coli (EAEC) NOT DETECTED NOT DETECTED Final   Enteropathogenic E coli (EPEC) DETECTED (A) NOT DETECTED Final    Comment: CRITICAL RESULT CALLED TO, READ BACK BY AND VERIFIED WITH: ANNIE MAGBATANG AT 1554 10/04/15 SDR    Enterotoxigenic E coli (ETEC) NOT DETECTED NOT DETECTED Final   Shiga like toxin producing E coli (STEC) NOT DETECTED NOT DETECTED Final   E. coli O157 NOT DETECTED NOT DETECTED Final   Shigella/Enteroinvasive E coli (EIEC) NOT DETECTED NOT DETECTED Final   Cryptosporidium NOT DETECTED NOT DETECTED Final   Cyclospora cayetanensis NOT DETECTED NOT DETECTED Final   Entamoeba  histolytica NOT DETECTED NOT DETECTED Final   Giardia lamblia NOT DETECTED NOT DETECTED Final   Adenovirus F40/41 NOT DETECTED NOT DETECTED Final   Astrovirus NOT DETECTED NOT DETECTED Final   Norovirus GI/GII NOT DETECTED NOT DETECTED Final   Rotavirus A NOT DETECTED NOT DETECTED Final   Sapovirus (I, II, IV, and V) NOT DETECTED NOT DETECTED Final  MRSA PCR Screening     Status: None   Collection Time: 10/05/15  7:32 PM  Result Value Ref Range Status   MRSA by PCR NEGATIVE NEGATIVE Final    Comment:        The GeneXpert MRSA Assay (FDA approved for NASAL specimens only), is one component of a comprehensive MRSA colonization surveillance program. It is not intended to diagnose MRSA infection nor to guide or monitor treatment for MRSA infections.      Labs: Basic Metabolic Panel:  Recent Labs Lab 10/03/15 1744 10/05/15 1829 10/06/15 0446 10/08/15 0314 10/10/15 0413  NA 128* 128* 133* 132* 134*  K 3.9 3.7 4.1 3.7 3.8  CL 99* 104 105 107 107  CO2 19* 17* 18* 16* 19*  GLUCOSE 118* 171* 195* 201* 200*  BUN 26* 15 16 36* 33*  CREATININE 3.00* 1.78* 1.76* 1.88* 1.67*  CALCIUM 8.8* 8.3* 8.9 8.5* 8.4*   Liver Function Tests:  Recent Labs Lab 10/05/15 1829  AST 26  ALT 12*  ALKPHOS 81  BILITOT 1.5*  PROT 5.9*  ALBUMIN 2.3*    Recent Labs Lab 10/03/15 2316  LIPASE 23   No results for input(s): AMMONIA in the last 168 hours. CBC:  Recent Labs Lab 10/03/15 1744 10/03/15 2137 10/06/15 0446 10/10/15 0413  WBC 27.6*  --  12.2* 10.4  NEUTROABS  --  23.7*  --   --   HGB 10.8*  --  9.4* 9.4*  HCT 32.5*  --  28.2* 27.8*  MCV 91.3  --  90.7 88.5  PLT 194  --  214 247   Cardiac Enzymes:  Recent Labs Lab 10/03/15 2316 10/04/15 0507 10/04/15 1019  TROPONINI 0.62* 0.47* 0.35*   BNP: BNP (last 3 results)  Recent Labs  07/27/15 0609 07/31/15 1545 10/03/15 2316  BNP 71.2 39.0 156.1*    ProBNP (last 3 results) No results for input(s): PROBNP in the  last 8760 hours.  CBG:  Recent Labs Lab 10/09/15 0733 10/09/15 1138 10/09/15 1622 10/09/15 2118 10/10/15 0737  GLUCAP 214* 217* 158* 211* 181*       Signed:  Rhetta Mura MD.  [facility] 10/10/2015, 9:44 AM

## 2015-10-10 NOTE — Progress Notes (Signed)
Sent home with equipment breathing machine and walker

## 2015-10-10 NOTE — Care Management Note (Signed)
Case Management Note  Patient Details  Name: Jahzeel Hogan MRN: [id number] Date of Birth: [birth year]  Subjective/Objective:     CM following for progression and d/c planning.                Action/Plan: 10/10/2015 Noted that pt has orders for rolling walker and neb, this CM contacted [facility] and asked that these be delivered to room as plan is for pt to d/c today. Spoke with pt family member as pt is nonenglish speaking and she is agreeable to [facility] services. HHPT and HHRN to be provided by [facility] . [facility] notified.   Expected Discharge Date:       10/10/2015           Expected Discharge Plan:  Home w Home Health Services  In-House Referral:  NA  Discharge planning Services  CM Consult  Post Acute Care Choice:  Durable Medical Equipment, Home Health Choice offered to:  Adult Children  DME Arranged:  Nebulizer machine, Walker rolling DME Agency:  [facility]  HH Arranged:  RN, PT [facility] Agency:  [facility]  Status of Service:  Completed, signed off  If discussed at Long Length of Stay Meetings, dates discussed:    Additional Comments:  Starlyn Skeans, RN 10/10/2015, 11:15 AM

## 2015-10-10 NOTE — Progress Notes (Signed)
Discharge was given with interpreter in room to go over and IV removed. Patient and family understood , when to follow up with MD and medication with changes and when to call MD for any problems

## 2015-10-12 ENCOUNTER — Emergency Department (HOSPITAL_COMMUNITY): Payer: Medicare Other

## 2015-10-12 ENCOUNTER — Emergency Department (HOSPITAL_COMMUNITY)
Admission: EM | Admit: 2015-10-12 | Discharge: 2015-10-12 | Disposition: A | Discharge status: Home / Self Care | Payer: Medicare Other | Attending: Emergency Medicine | Admitting: Emergency Medicine

## 2015-10-12 ENCOUNTER — Telehealth: Payer: Self-pay | Admitting: *Deleted

## 2015-10-12 ENCOUNTER — Encounter (HOSPITAL_COMMUNITY): Payer: Self-pay | Admitting: *Deleted

## 2015-10-12 DIAGNOSIS — I2510 Atherosclerotic heart disease of native coronary artery without angina pectoris: Secondary | ICD-10-CM | POA: Insufficient documentation

## 2015-10-12 DIAGNOSIS — Z87891 Personal history of nicotine dependence: Secondary | ICD-10-CM | POA: Diagnosis not present

## 2015-10-12 DIAGNOSIS — J449 Chronic obstructive pulmonary disease, unspecified: Secondary | ICD-10-CM | POA: Diagnosis not present

## 2015-10-12 DIAGNOSIS — Z79899 Other long term (current) drug therapy: Secondary | ICD-10-CM | POA: Diagnosis not present

## 2015-10-12 DIAGNOSIS — Z7982 Long term (current) use of aspirin: Secondary | ICD-10-CM | POA: Insufficient documentation

## 2015-10-12 DIAGNOSIS — Z7901 Long term (current) use of anticoagulants: Secondary | ICD-10-CM | POA: Diagnosis not present

## 2015-10-12 DIAGNOSIS — N183 Chronic kidney disease, stage 3 (moderate): Secondary | ICD-10-CM | POA: Insufficient documentation

## 2015-10-12 DIAGNOSIS — Z8673 Personal history of transient ischemic attack (TIA), and cerebral infarction without residual deficits: Secondary | ICD-10-CM | POA: Diagnosis not present

## 2015-10-12 DIAGNOSIS — I5022 Chronic systolic (congestive) heart failure: Secondary | ICD-10-CM | POA: Diagnosis not present

## 2015-10-12 DIAGNOSIS — Z91040 Latex allergy status: Secondary | ICD-10-CM | POA: Insufficient documentation

## 2015-10-12 DIAGNOSIS — R079 Chest pain, unspecified: Secondary | ICD-10-CM | POA: Diagnosis not present

## 2015-10-12 DIAGNOSIS — I130 Hypertensive heart and chronic kidney disease with heart failure and stage 1 through stage 4 chronic kidney disease, or unspecified chronic kidney disease: Secondary | ICD-10-CM | POA: Diagnosis not present

## 2015-10-12 LAB — BASIC METABOLIC PANEL
Anion gap: 10 (ref 5–15)
BUN: 21 mg/dL — AB (ref 6–20)
CALCIUM: 8.8 mg/dL — AB (ref 8.9–10.3)
CO2: 23 mmol/L (ref 22–32)
CREATININE: 1.56 mg/dL — AB (ref 0.61–1.24)
Chloride: 102 mmol/L (ref 101–111)
GFR calc non Af Amer: 38 mL/min — ABNORMAL LOW (ref >60)
GFR, EST AFRICAN AMERICAN: 44 mL/min — AB (ref >60)
Glucose, Bld: 161 mg/dL — ABNORMAL HIGH (ref 65–99)
Potassium: 3.4 mmol/L — ABNORMAL LOW (ref 3.5–5.1)
SODIUM: 135 mmol/L (ref 135–145)

## 2015-10-12 LAB — I-STAT TROPONIN, ED: TROPONIN I, POC: 0.02 ng/mL (ref 0.00–0.08)

## 2015-10-12 LAB — BRAIN NATRIURETIC PEPTIDE: B NATRIURETIC PEPTIDE 5: 214 pg/mL — AB (ref 0.0–100.0)

## 2015-10-12 LAB — CBC
HCT: 34.1 % — ABNORMAL LOW (ref 39.0–52.0)
Hemoglobin: 11.3 g/dL — ABNORMAL LOW (ref 13.0–17.0)
MCH: 30.4 pg (ref 26.0–34.0)
MCHC: 33.1 g/dL (ref 30.0–36.0)
MCV: 91.7 fL (ref 78.0–100.0)
PLATELETS: 306 10*3/uL (ref 150–400)
RBC: 3.72 MIL/uL — AB (ref 4.22–5.81)
RDW: 15.9 % — AB (ref 11.5–15.5)
WBC: 13.7 10*3/uL — AB (ref 4.0–10.5)

## 2015-10-12 LAB — I-STAT CG4 LACTIC ACID, ED: LACTIC ACID, VENOUS: 1.29 mmol/L (ref 0.5–1.9)

## 2015-10-12 MED ORDER — ALBUTEROL SULFATE (2.5 MG/3ML) 0.083% IN NEBU
5.0000 mg | INHALATION_SOLUTION | Freq: Once | RESPIRATORY_TRACT | Status: AC
  Administered 2015-10-12: 5 mg via RESPIRATORY_TRACT
  Filled 2015-10-12: qty 6

## 2015-10-12 MED ORDER — HYDROCODONE-ACETAMINOPHEN 5-325 MG PO TABS: 1.0000 | ORAL_TABLET | Freq: Four times a day (QID) | ORAL | 0 refills | Status: DC | PRN

## 2015-10-12 MED ORDER — IPRATROPIUM BROMIDE 0.02 % IN SOLN
0.5000 mg | Freq: Once | RESPIRATORY_TRACT | Status: AC
  Administered 2015-10-12: 0.5 mg via RESPIRATORY_TRACT
  Filled 2015-10-12: qty 2.5

## 2015-10-12 MED ORDER — HYDROCODONE-ACETAMINOPHEN 5-325 MG PO TABS
2.0000 | ORAL_TABLET | Freq: Once | ORAL | Status: AC
  Administered 2015-10-12: 2 via ORAL
  Filled 2015-10-12: qty 2

## 2015-10-12 NOTE — ED Provider Notes (Signed)
[facility]-EMERGENCY DEPT Provider Note   CSN: [id number] Arrival date & time: 10/12/15  6962     History   Chief Complaint Chief Complaint  Patient presents with  . Chest Pain    HPI [name] is a 82 y.o. male.  82yo M w/ extensive hx including CAD, AAA, CHF, COPD who p/w chest pain and SOB. History obtained from the patient's daughter and limited due to language barrier. The patient was hospitalized recently and discharged 2 days ago after being diagnosed with left-sided pneumonia. Daughter reports that since yesterday, his left-sided chest pain and shortness of breath have worsened at home. He has been taking all of his prescribed medications except for his breathing treatments due to problems with obtaining the prescription for the medication, as it was requiring authorization. The chest pain is worse with inspiration, left-sided, and is the same pain he has had in the hospital. No fevers, vomiting, or complaints of abdominal pain. No increase in lower extremity edema.   The history is provided by a relative. The history is limited by a language barrier.  Chest Pain      Past Medical History:  Diagnosis Date  . AAA (abdominal aortic aneurysm) (HCC)   . Anemia   . Asthma   . Carotid artery disease (HCC)   . CHF (congestive heart failure) (HCC)   . Coronary artery disease   . Depression   . Gout   . H. pylori infection   . Hard of hearing   . Hiatal hernia   . Hyperplasia, prostate   . Hypertension   . PUD (peptic ulcer disease)   . Renal failure, acute (HCC) 11/08/2012    Patient Active Problem List   Diagnosis Date Noted  . COPD exacerbation (HCC)   . Hypoxemia   . Atelectasis   . HAP (hospital-acquired pneumonia)   . Acute on chronic respiratory failure (HCC)   . Severe sepsis (HCC) 10/03/2015  . HCAP (healthcare-associated pneumonia) 10/03/2015  . Elevated troponin 10/03/2015  . Nausea, vomiting and diarrhea 10/03/2015  . Abdominal pain 10/03/2015  .  Pneumonia 07/27/2015  . CKD (chronic kidney disease), stage III 07/27/2015  . Carotid artery disease (HCC) 07/27/2015  . CAP (community acquired pneumonia) 07/27/2015  . Weakness generalized 06/18/2014  . Hyponatremia 06/18/2014  . Dizziness 06/18/2014  . Dizzy 06/18/2014  . Pain in the chest   . Paroxysmal atrial fibrillation (HCC) 11/16/2013  . Diarrhea 11/16/2013  . Vomiting 11/16/2013  . Urticaria 11/16/2013  . Ischemic cardiomyopathy 11/16/2013  . Headache 11/16/2013  . Chronic anticoagulation 11/16/2013  . Neck pain on left side 11/16/2013  . Acute chest pain 11/16/2013  . Chest pain at rest 11/16/2013  . Coronary artery disease 10/14/2013  . Chronic systolic heart failure, echocardiogram March 2015: ejection fraction 35%,  10/14/2013  . AAA (abdominal aortic aneurysm) without rupture (HCC) 09/08/2013  . Hyperlipidemia 06/08/2013  . CVA (cerebral infarction) 05/02/2013  . Dyspnea 04/29/2013  . Acute CHF- presume secondary to AF and diastolic dysfunction. (Nl LVF 2010) 04/29/2013  . Acute renal failure superimposed on stage 3 chronic kidney disease (HCC) 04/29/2013  . PVD (peripheral vascular disease) 3.5cm AAA Aug 2014 04/29/2013  . Atrial fibrillation- unknown duration 04/28/2013  . NSTEMI - ? type 2 - Troponin 0.63 04/28/2013  . Chronic bilateral lower abdominal pain 02/17/2013  . Hypertension     Past Surgical History:  Procedure Laterality Date  . CAROTID STENT INSERTION  2015  . CAROTID STENT INSERTION N/A 05/09/2013  Procedure: CAROTID STENT INSERTION;  Surgeon: Runell Gess, MD;  Location[facility] CATH LAB;  Service: Cardiovascular;  Laterality: N/A;  . ESOPHAGOGASTRODUODENOSCOPY  06/13/2008,12/31/12  . LEFT HEART CATHETERIZATION WITH CORONARY ANGIOGRAM N/A 05/04/2013   Procedure: LEFT HEART CATHETERIZATION WITH CORONARY ANGIOGRAM;  Surgeon: Peter M Swaziland, MD;  Location: [facility] CATH LAB;  Service: Cardiovascular;  Laterality: N/A;       Home Medications    Prior  to Admission medications   Medication Sig Start Date End Date Taking? Authorizing Provider  allopurinol (ZYLOPRIM) 300 MG tablet Take 300 mg by mouth daily. To prevent gout   Yes Historical Provider, MD  amoxicillin-clavulanate (AUGMENTIN) 500-125 MG tablet Take 1 tablet (500 mg total) by mouth every 12 (twelve) hours. 10/10/15  Yes Rhetta Mura, MD  arformoterol (BROVANA) 15 MCG/2ML NEBU Take 2 mLs (15 mcg total) by nebulization 2 (two) times daily. 10/10/15  Yes Rhetta Mura, MD  aspirin EC 81 MG tablet Take 1 tablet (81 mg total) by mouth daily. 09/08/13  Yes Marvel Plan, MD  atorvastatin (LIPITOR) 40 MG tablet Take 1 tablet (40 mg total) by mouth daily at 6 PM. 05/11/13  Yes Brittainy Sherlynn Carbon, PA-C  azithromycin (ZITHROMAX) 500 MG tablet Take 1 tablet (500 mg total) by mouth at bedtime. 10/10/15  Yes Rhetta Mura, MD  carvedilol (COREG) 3.125 MG tablet Take 1 tablet (3.125 mg total) by mouth 2 (two) times daily with a meal. 06/19/14  Yes Vassie Loll, MD  dextromethorphan-guaiFENesin [facility] DM) 30-600 MG 12hr tablet Take 1 tablet by mouth 2 (two) times daily. 10/10/15  Yes Rhetta Mura, MD  esomeprazole (NEXIUM) 40 MG capsule Take 1 capsule (40 mg total) by mouth daily. 07/31/15  Yes Margarita Grizzle, MD  ipratropium-albuterol (DUONEB) 0.5-2.5 (3) MG/3ML SOLN Take 3 mLs by nebulization 2 (two) times daily. May use 1 additional dose during the day if needed   Yes Historical Provider, MD  isosorbide mononitrate (IMDUR) 30 MG 24 hr tablet Take 30 mg by mouth once daily Patient taking differently: Take 30 mg by mouth daily. For control of heart pain 07/31/15  Yes Margarita Grizzle, MD  nitroGLYCERIN (NITROSTAT) 0.4 MG SL tablet Place 1 tablet (0.4 mg total) under the tongue every 5 (five) minutes x 3 doses as needed for chest pain. 05/11/13  Yes Brittainy Sherlynn Carbon, PA-C  predniSONE (DELTASONE) 20 MG tablet Take 3 tablets by mouth for 5 days then 2 tablets by mouth for 5 days then 1 tablet  by mouth for 4 days then stop. 10/10/15  Yes Rhetta Mura, MD  Rivaroxaban (XARELTO) 15 MG TABS tablet Take 15 mg by mouth daily. To prevent strokes   Yes Historical Provider, MD  tamsulosin (FLOMAX) 0.4 MG CAPS capsule Take 0.4 mg by mouth daily. To improve bladder function   Yes Historical Provider, MD  budesonide (PULMICORT) 0.5 MG/2ML nebulizer solution Take 2 mLs (0.5 mg total) by nebulization 2 (two) times daily. 10/10/15   Rhetta Mura, MD  HYDROcodone-acetaminophen (NORCO) 5-325 MG tablet Take 1-2 tablets by mouth every 6 (six) hours as needed for moderate pain or severe pain. 10/12/15   Laurence Spates, MD    Family History Family History  Problem Relation Age of Onset  . Colon cancer Neg Hx     Social History Social History  Substance Use Topics  . Smoking status: Former Smoker    Quit date: 02/17/2002  . Smokeless tobacco: Never Used     Comment: Quit seven years ago   .  Alcohol use No     Allergies   Levofloxacin; Eliquis [apixaban]; Phenazopyridine hcl; Septra [sulfamethoxazole-trimethoprim]; Latex; and Tape   Review of Systems Review of Systems  Cardiovascular: Positive for chest pain.  10 Systems reviewed and are negative for acute change except as noted in the HPI.    Physical Exam Updated Vital Signs BP 134/61 (BP Location: Left Arm)   Pulse 85   Temp 97.6 F (36.4 C) (Oral)   Resp 26   Wt 154 lb (69.9 kg)   SpO2 97%   BMI 28.17 kg/m   Physical Exam  Constitutional: He is oriented to person, place, and time. No distress.  Frail, elderly man, dyspneic but NAD  HENT:  Head: Normocephalic and atraumatic.  Moist mucous membranes  Eyes: Conjunctivae are normal. Pupils are equal, round, and reactive to light.  Neck: Neck supple.  Cardiovascular: Normal rate and normal heart sounds.  An irregularly irregular rhythm present.  No murmur heard. Pulmonary/Chest: He has wheezes.  Mildly increased WOB w/ tachypnea, expiratory wheezes and  prolonged expiratory phase  Abdominal: Soft. Bowel sounds are normal. He exhibits no distension. There is no tenderness.  Musculoskeletal: He exhibits edema (trace BLE).  No calf tenderness  Neurological: He is alert and oriented to person, place, and time.  Fluent speech  Skin: Skin is warm and dry.  Psychiatric: He has a normal mood and affect. Judgment normal.  Nursing note and vitals reviewed.    ED Treatments / Results  Labs (all labs ordered are listed, but only abnormal results are displayed) Labs Reviewed  BASIC METABOLIC PANEL - Abnormal; Notable for the following:       Result Value   Potassium 3.4 (*)    Glucose, Bld 161 (*)    BUN 21 (*)    Creatinine, Ser 1.56 (*)    Calcium 8.8 (*)    GFR calc non Af Amer 38 (*)    GFR calc Af Amer 44 (*)    All other components within normal limits  CBC - Abnormal; Notable for the following:    WBC 13.7 (*)    RBC 3.72 (*)    Hemoglobin 11.3 (*)    HCT 34.1 (*)    RDW 15.9 (*)    All other components within normal limits  BRAIN NATRIURETIC PEPTIDE - Abnormal; Notable for the following:    B Natriuretic Peptide 214.0 (*)    All other components within normal limits  I-STAT TROPOININ, ED  I-STAT CG4 LACTIC ACID, ED    EKG  EKG Interpretation  Date/Time:  Friday October 12 2015 07:31:50 EDT Ventricular Rate:  110 PR Interval:    QRS Duration: 113 QT Interval:  373 QTC Calculation: 505 R Axis:   48 Text Interpretation:  Atrial fibrillation Borderline intraventricular conduction delay Borderline repolarization abnormality Prolonged QT interval Interpretation limited secondary to artifact no significant changes from previous Confirmed by LITTLE MD, RACHEL [phone]([id number]) on 10/12/2015 7:39:21 AM       Radiology Dg Chest 2 View  Result Date: 10/12/2015 CLINICAL DATA:  Cough for 2 days EXAM: CHEST  2 VIEW COMPARISON:  October 06, 2015 FINDINGS: There is persistent airspace consolidation in the anterior segment of the left upper  lobe. There is more subtle patchy infiltrate in the right base. Lungs elsewhere are clear. Heart is borderline enlarged with pulmonary vascularity within normal limits. There is atherosclerotic calcification in the aorta. No adenopathy. There is degenerative change in the lower thoracic spine peer IMPRESSION: Persistent left upper  lobe infiltrate, unchanged from most recent prior study. Subtle right base infiltrate, also not appreciably changed. Lungs elsewhere clear. Stable cardiac silhouette. There is aortic atherosclerosis. Electronically Signed   By: Bretta Bang III M.D.   On: 10/12/2015 08:36    Procedures Procedures (including critical care time)  Medications Ordered in ED Medications  albuterol (PROVENTIL) (2.5 MG/3ML) 0.083% nebulizer solution 5 mg (5 mg Nebulization Given 10/12/15 0800)  ipratropium (ATROVENT) nebulizer solution 0.5 mg (0.5 mg Nebulization Given 10/12/15 0800)  HYDROcodone-acetaminophen (NORCO/VICODIN) 5-325 MG per tablet 2 tablet (2 tablets Oral Given 10/12/15 0920)     Initial Impression / Assessment and Plan / ED Course  I have reviewed the triage vital signs and the nursing notes.  Pertinent labs & imaging results that were available during my care of the patient were reviewed by me and considered in my medical decision making (see chart for details).  Clinical Course    Pt d/c'd from hospital 2 days ago for sepsis due to pneumonia and NSTEMI 2/2 demand ischemia p/w Worsening chest pain and shortness of breath at home since discharge. He was nontoxic, no respiratory distress on exam. He had tachypnea, expiratory wheezes, and prolonged expiratory phase. Vital signs stable, patient afebrile. EKG unchanged from previous. Gave the patient a DuoNeb, Norco, and obtained above lab work as well as chest x-ray.  Labs show BUN 21, creatinine 1.56, WBC 13.7, hemoglobin 11.3, BNP 214, negative troponin. His labs appear to be improving from hospitalization, specifically his  AK I. Chest x-ray shows no significant change from previous chest x-ray noting infiltrate. No new findings. On reexamination, the patient was resting comfortably and family stated that his symptoms were much improved with the medications he had received. Family states that his symptoms are the same symptoms that he presented with during his hospitalization. Because he is compliant with Xarelto, I feel PE is unlikely. He is afebrile w/ reassuring VS and labs therefore I doubt worsening infection. Case management was contacted and has confirmed that his albuterol is waiting at the pharmacy today. I have discussed supportive care for his symptoms including scheduled breathing treatments if needed and Norco as needed for chest wall pain. His pain is related to breathing and was present during previous hospitalization, therefore I feel it is likely related to his pneumonia rather than cardiac etiology. Patient will follow-up with his PCP early next week and I have emphasized the importance of return precautions. Family voiced understanding of plan and patient discharged in satisfactory condition. Final Clinical Impressions(s) / ED Diagnoses   Final diagnoses:  Nonspecific chest pain  Chronic obstructive pulmonary disease, unspecified COPD type (HCC)    New Prescriptions New Prescriptions   HYDROCODONE-ACETAMINOPHEN (NORCO) 5-325 MG TABLET    Take 1-2 tablets by mouth every 6 (six) hours as needed for moderate pain or severe pain.     Laurence Spates, MD 10/12/15 1248

## 2015-10-12 NOTE — ED Notes (Signed)
Pt constant, repetitive questioning

## 2015-10-12 NOTE — Discharge Instructions (Signed)
You may take Norco, one to 2 tablets every 4-6 hours as needed for severe chest pain. The medication contains Tylenol also do not take Tylenol while taking this medication. Use albuterol breathing treatment as often as every 4-6 hours if he needs to for shortness of breath.

## 2015-10-12 NOTE — Care Management (Signed)
Cindy from [employer] on [address] called MATCH letter not working. Entered patient in PDMI. Called Cindy back, MATCH went through.  Ronny Flurry RN BSN [phone]

## 2015-10-12 NOTE — Telephone Encounter (Signed)
Pt daughter called stating prior Berkley Harvey has not gone through yet and she is getting anxious about keeping pt home without that medication available.  EDCM contacted [facility] office to confirm prior auth was completed.  [facility] called [facility] and spoke with Pharm D who states prior auth can take minutes to days to be approved.  EDCM enrolled pt in [facility] program to provide funds for medication.  EDCM sensed anxiety and anger in pt daughter voice and felt that pt would return to ER promptly if pt daughter left [facility] without Rx in hand.

## 2015-10-12 NOTE — ED Triage Notes (Signed)
Pt was discharged Wed after being  tx for pnx.  Since yesterday, pt c/o L chest pain and increasing sob.

## 2015-10-12 NOTE — Discharge Planning (Signed)
[facility] consulted to assist with discharge needs.  Pt discharged from 6E on 8/30 with nebulizer machine and rolling walker.  Pt received both pieces of equipment on 8/24 prior to discharge home.  After discharge, Pt daughter attempted to pick up Rx from [facility] on [address] to find that [employer] required prior authorization from ordering MD.  Jordan Hawks states they faxed prior auth request to ordering MD with no response.    EDCM contacted [facility] office (prior auth would have been faxed there) and spoke with [place] EDCM explained situation to [place] who offered to have prior auth called in to [facility] on [address] ASAP and come and speak with the pt and family.    No further case management needs communicated at this time.  Nolon Yellin J. Lucretia Roers, RN, BSN, [place] [phone]

## 2015-10-12 NOTE — ED Notes (Signed)
Case management at bedside.

## 2015-10-27 ENCOUNTER — Emergency Department (HOSPITAL_COMMUNITY)
Admission: EM | Admit: 2015-10-27 | Discharge: 2015-10-28 | Disposition: A | Discharge status: Home / Self Care | Payer: Medicare Other | Attending: Emergency Medicine | Admitting: Emergency Medicine

## 2015-10-27 ENCOUNTER — Encounter (HOSPITAL_COMMUNITY): Payer: Self-pay

## 2015-10-27 ENCOUNTER — Emergency Department (HOSPITAL_COMMUNITY): Payer: Medicare Other

## 2015-10-27 ENCOUNTER — Other Ambulatory Visit: Payer: Self-pay | Admitting: Cardiology

## 2015-10-27 DIAGNOSIS — R079 Chest pain, unspecified: Secondary | ICD-10-CM | POA: Diagnosis present

## 2015-10-27 DIAGNOSIS — Z87891 Personal history of nicotine dependence: Secondary | ICD-10-CM | POA: Insufficient documentation

## 2015-10-27 DIAGNOSIS — Z79899 Other long term (current) drug therapy: Secondary | ICD-10-CM | POA: Insufficient documentation

## 2015-10-27 DIAGNOSIS — Z7901 Long term (current) use of anticoagulants: Secondary | ICD-10-CM | POA: Insufficient documentation

## 2015-10-27 DIAGNOSIS — B370 Candidal stomatitis: Secondary | ICD-10-CM

## 2015-10-27 DIAGNOSIS — B379 Candidiasis, unspecified: Secondary | ICD-10-CM | POA: Diagnosis not present

## 2015-10-27 DIAGNOSIS — Z8673 Personal history of transient ischemic attack (TIA), and cerebral infarction without residual deficits: Secondary | ICD-10-CM | POA: Insufficient documentation

## 2015-10-27 DIAGNOSIS — Z91040 Latex allergy status: Secondary | ICD-10-CM | POA: Diagnosis not present

## 2015-10-27 DIAGNOSIS — Z7982 Long term (current) use of aspirin: Secondary | ICD-10-CM | POA: Diagnosis not present

## 2015-10-27 DIAGNOSIS — N183 Chronic kidney disease, stage 3 (moderate): Secondary | ICD-10-CM | POA: Insufficient documentation

## 2015-10-27 DIAGNOSIS — J449 Chronic obstructive pulmonary disease, unspecified: Secondary | ICD-10-CM | POA: Diagnosis not present

## 2015-10-27 DIAGNOSIS — I130 Hypertensive heart and chronic kidney disease with heart failure and stage 1 through stage 4 chronic kidney disease, or unspecified chronic kidney disease: Secondary | ICD-10-CM | POA: Diagnosis not present

## 2015-10-27 DIAGNOSIS — I2510 Atherosclerotic heart disease of native coronary artery without angina pectoris: Secondary | ICD-10-CM | POA: Insufficient documentation

## 2015-10-27 DIAGNOSIS — R918 Other nonspecific abnormal finding of lung field: Secondary | ICD-10-CM | POA: Insufficient documentation

## 2015-10-27 DIAGNOSIS — I4891 Unspecified atrial fibrillation: Secondary | ICD-10-CM | POA: Diagnosis not present

## 2015-10-27 DIAGNOSIS — I5022 Chronic systolic (congestive) heart failure: Secondary | ICD-10-CM | POA: Insufficient documentation

## 2015-10-27 LAB — CBC
HEMATOCRIT: 34.7 % — AB (ref 39.0–52.0)
HEMOGLOBIN: 11.3 g/dL — AB (ref 13.0–17.0)
MCH: 30.2 pg (ref 26.0–34.0)
MCHC: 32.6 g/dL (ref 30.0–36.0)
MCV: 92.8 fL (ref 78.0–100.0)
Platelets: 142 10*3/uL — ABNORMAL LOW (ref 150–400)
RBC: 3.74 MIL/uL — ABNORMAL LOW (ref 4.22–5.81)
RDW: 15.3 % (ref 11.5–15.5)
WBC: 10.3 10*3/uL (ref 4.0–10.5)

## 2015-10-27 LAB — BASIC METABOLIC PANEL
ANION GAP: 8 (ref 5–15)
BUN: 19 mg/dL (ref 6–20)
CO2: 24 mmol/L (ref 22–32)
Calcium: 8.4 mg/dL — ABNORMAL LOW (ref 8.9–10.3)
Chloride: 96 mmol/L — ABNORMAL LOW (ref 101–111)
Creatinine, Ser: 1.86 mg/dL — ABNORMAL HIGH (ref 0.61–1.24)
GFR, EST AFRICAN AMERICAN: 36 mL/min — AB (ref >60)
GFR, EST NON AFRICAN AMERICAN: 31 mL/min — AB (ref >60)
Glucose, Bld: 363 mg/dL — ABNORMAL HIGH (ref 65–99)
POTASSIUM: 4.4 mmol/L (ref 3.5–5.1)
SODIUM: 128 mmol/L — AB (ref 135–145)

## 2015-10-27 LAB — I-STAT TROPONIN, ED: Troponin i, poc: 0.01 ng/mL (ref 0.00–0.08)

## 2015-10-27 MED ORDER — CLINDAMYCIN HCL 300 MG PO CAPS: 300.0000 mg | ORAL_CAPSULE | Freq: Three times a day (TID) | ORAL | 0 refills | Status: DC

## 2015-10-27 MED ORDER — NYSTATIN 100000 UNIT/ML MT SUSP: 500000.0000 [IU] | Freq: Four times a day (QID) | OROMUCOSAL | 0 refills | Status: DC

## 2015-10-27 MED ORDER — CLINDAMYCIN HCL 150 MG PO CAPS
300.0000 mg | ORAL_CAPSULE | Freq: Once | ORAL | Status: AC
  Administered 2015-10-27: 300 mg via ORAL
  Filled 2015-10-27: qty 2

## 2015-10-27 NOTE — ED Notes (Signed)
Family at bedside. 

## 2015-10-27 NOTE — ED Triage Notes (Signed)
Pt here with daughter who is translating for patient. She reports he has been feeling weak, complaining of irregular heart beat and has been coughing up blood. She also reports an episode of hypotension.

## 2015-10-27 NOTE — ED Notes (Signed)
MD at bedside. 

## 2015-10-27 NOTE — Discharge Instructions (Signed)
Your Ct scan shows either a pneumonia, or possible a lung cancer.  You will need to follow up with a Lung Specialist.    Meanwhile, you will be treated with antibiotics. If the area does not bo away, you will      require a test with the lung specialist called a bronchoscopy.  You have thrush.  You are being given an antifungal mouth rinse for this.  Continue all three inhaled medications: **Brovana--a daily maintenance medication **Combivent --a twice daily maintainence medication **Budesonide--a daily steroid medication

## 2015-10-27 NOTE — ED Notes (Signed)
Patient is resting comfortably. 

## 2015-10-28 MED ORDER — HYDROCODONE-ACETAMINOPHEN 5-325 MG PO TABS: 1.0000 | ORAL_TABLET | ORAL | 0 refills | Status: DC | PRN

## 2015-10-28 MED ORDER — BENZONATATE 100 MG PO CAPS: 100.0000 mg | ORAL_CAPSULE | Freq: Three times a day (TID) | ORAL | 0 refills | Status: DC

## 2015-10-29 NOTE — Telephone Encounter (Signed)
Rx(s) sent to pharmacy electronically.  

## 2015-10-30 NOTE — ED Provider Notes (Signed)
[facility]-EMERGENCY DEPT Provider Note   CSN: [id number] Arrival date & time: 10/27/15  1743     History   Chief Complaint Chief Complaint  Patient presents with  . Cough  . Chest Pain    HPI [name] is a 82 y.o. male.  He presents with family with complaint of continued intermittent shortness of breath. Is on treatment for recent pneumonia. Discharged 831. Finishes anabolic since improved. Symptoms however persists with slight shortness of breath and chest pain since that time.  Definite language barrier. Interpretation is via daughter who is his caregiver as well.  HPI  Past Medical History:  Diagnosis Date  . AAA (abdominal aortic aneurysm) (HCC)   . Anemia   . Asthma   . Carotid artery disease (HCC)   . CHF (congestive heart failure) (HCC)   . Coronary artery disease   . Depression   . Gout   . H. pylori infection   . Hard of hearing   . Hiatal hernia   . Hyperplasia, prostate   . Hypertension   . PUD (peptic ulcer disease)   . Renal failure, acute (HCC) 11/08/2012    Patient Active Problem List   Diagnosis Date Noted  . COPD exacerbation (HCC)   . Hypoxemia   . Atelectasis   . HAP (hospital-acquired pneumonia)   . Acute on chronic respiratory failure (HCC)   . Severe sepsis (HCC) 10/03/2015  . HCAP (healthcare-associated pneumonia) 10/03/2015  . Elevated troponin 10/03/2015  . Nausea, vomiting and diarrhea 10/03/2015  . Abdominal pain 10/03/2015  . Pneumonia 07/27/2015  . CKD (chronic kidney disease), stage III 07/27/2015  . Carotid artery disease (HCC) 07/27/2015  . CAP (community acquired pneumonia) 07/27/2015  . Weakness generalized 06/18/2014  . Hyponatremia 06/18/2014  . Dizziness 06/18/2014  . Dizzy 06/18/2014  . Pain in the chest   . Paroxysmal atrial fibrillation (HCC) 11/16/2013  . Diarrhea 11/16/2013  . Vomiting 11/16/2013  . Urticaria 11/16/2013  . Ischemic cardiomyopathy 11/16/2013  . Headache 11/16/2013  . Chronic  anticoagulation 11/16/2013  . Neck pain on left side 11/16/2013  . Acute chest pain 11/16/2013  . Chest pain at rest 11/16/2013  . Coronary artery disease 10/14/2013  . Chronic systolic heart failure, echocardiogram March 2015: ejection fraction 35%,  10/14/2013  . AAA (abdominal aortic aneurysm) without rupture (HCC) 09/08/2013  . Hyperlipidemia 06/08/2013  . CVA (cerebral infarction) 05/02/2013  . Dyspnea 04/29/2013  . Acute CHF- presume secondary to AF and diastolic dysfunction. (Nl LVF 2010) 04/29/2013  . Acute renal failure superimposed on stage 3 chronic kidney disease (HCC) 04/29/2013  . PVD (peripheral vascular disease) 3.5cm AAA Aug 2014 04/29/2013  . Atrial fibrillation- unknown duration 04/28/2013  . NSTEMI - ? type 2 - Troponin 0.63 04/28/2013  . Chronic bilateral lower abdominal pain 02/17/2013  . Hypertension     Past Surgical History:  Procedure Laterality Date  . CAROTID STENT INSERTION  2015  . CAROTID STENT INSERTION N/A 05/09/2013   Procedure: CAROTID STENT INSERTION;  Surgeon: Runell Gess, MD;  Location: [facility] CATH LAB;  Service: Cardiovascular;  Laterality: N/A;  . ESOPHAGOGASTRODUODENOSCOPY  06/13/2008,12/31/12  . LEFT HEART CATHETERIZATION WITH CORONARY ANGIOGRAM N/A 05/04/2013   Procedure: LEFT HEART CATHETERIZATION WITH CORONARY ANGIOGRAM;  Surgeon: Peter M Swaziland, MD;  Location: [facility] CATH LAB;  Service: Cardiovascular;  Laterality: N/A;       Home Medications    Prior to Admission medications   Medication Sig Start Date End Date Taking? Authorizing Provider  allopurinol (ZYLOPRIM) 300 MG tablet Take 300 mg by mouth daily. To prevent gout    Historical Provider, MD  amoxicillin-clavulanate (AUGMENTIN) 500-125 MG tablet Take 1 tablet (500 mg total) by mouth every 12 (twelve) hours. 10/10/15   Rhetta MuraJai-Gurmukh Samtani, MD  arformoterol (BROVANA) 15 MCG/2ML NEBU Take 2 mLs (15 mcg total) by nebulization 2 (two) times daily. 10/10/15   Rhetta MuraJai-Gurmukh Samtani, MD  aspirin  EC 81 MG tablet Take 1 tablet (81 mg total) by mouth daily. 09/08/13   Marvel PlanJindong Xu, MD  atorvastatin (LIPITOR) 40 MG tablet Take 1 tablet (40 mg total) by mouth daily at 6 PM. 05/11/13   Brittainy M Sharol HarnessSimmons, PA-C  azithromycin (ZITHROMAX) 500 MG tablet Take 1 tablet (500 mg total) by mouth at bedtime. 10/10/15   Rhetta MuraJai-Gurmukh Samtani, MD  benzonatate (TESSALON) 100 MG capsule Take 1 capsule (100 mg total) by mouth every 8 (eight) hours. 10/28/15   Rolland PorterMark Marsden Zaino, MD  budesonide (PULMICORT) 0.5 MG/2ML nebulizer solution Take 2 mLs (0.5 mg total) by nebulization 2 (two) times daily. 10/10/15   Rhetta MuraJai-Gurmukh Samtani, MD  carvedilol (COREG) 3.125 MG tablet Take 1 tablet (3.125 mg total) by mouth 2 (two) times daily with a meal. 06/19/14   Vassie Lollarlos Madera, MD  clindamycin (CLEOCIN) 300 MG capsule Take 1 capsule (300 mg total) by mouth 3 (three) times daily. 10/27/15   Rolland PorterMark Chisa Kushner, MD  dextromethorphan-guaiFENesin [facility](MUCINEX DM) 30-600 MG 12hr tablet Take 1 tablet by mouth 2 (two) times daily. 10/10/15   Rhetta MuraJai-Gurmukh Samtani, MD  esomeprazole (NEXIUM) 40 MG capsule Take 1 capsule (40 mg total) by mouth daily. 07/31/15   Margarita Grizzleanielle Ray, MD  HYDROcodone-acetaminophen (NORCO/VICODIN) 5-325 MG tablet Take 1 tablet by mouth every 4 (four) hours as needed. 10/28/15   Rolland PorterMark Addylin Manke, MD  ipratropium-albuterol (DUONEB) 0.5-2.5 (3) MG/3ML SOLN Take 3 mLs by nebulization 2 (two) times daily. May use 1 additional dose during the day if needed    Historical Provider, MD  isosorbide mononitrate (IMDUR) 30 MG 24 hr tablet Take 30 mg by mouth once daily Patient taking differently: Take 30 mg by mouth daily. For control of heart pain 07/31/15   Margarita Grizzleanielle Ray, MD  NITROSTAT 0.4 MG SL tablet PLACE 1 TABLET UNDER THE TONGUE EVERY 5 MINUTES FOR 3 DOSES AS NEEDED FOR CHEST PAIN 10/29/15   Runell GessJonathan J Berry, MD  nystatin (MYCOSTATIN) 100000 UNIT/ML suspension Take 5 mLs (500,000 Units total) by mouth 4 (four) times daily. 10/27/15   Rolland PorterMark Osmel Dykstra, MD  predniSONE  (DELTASONE) 20 MG tablet Take 3 tablets by mouth for 5 days then 2 tablets by mouth for 5 days then 1 tablet by mouth for 4 days then stop. 10/10/15   Rhetta MuraJai-Gurmukh Samtani, MD  Rivaroxaban (XARELTO) 15 MG TABS tablet Take 15 mg by mouth daily. To prevent strokes    Historical Provider, MD  tamsulosin (FLOMAX) 0.4 MG CAPS capsule Take 0.4 mg by mouth daily. To improve bladder function    Historical Provider, MD    Family History Family History  Problem Relation Age of Onset  . Colon cancer Neg Hx     Social History Social History  Substance Use Topics  . Smoking status: Former Smoker    Quit date: 02/17/2002  . Smokeless tobacco: Never Used     Comment: Quit seven years ago   . Alcohol use No     Allergies   Levofloxacin; Eliquis [apixaban]; Phenazopyridine hcl; Septra [sulfamethoxazole-trimethoprim]; Latex; and Tape   Review of Systems Review of Systems  Constitutional: Negative for appetite change, chills, diaphoresis, fatigue and fever.  HENT: Negative for mouth sores, sore throat and trouble swallowing.   Eyes: Negative for visual disturbance.  Respiratory: Positive for cough and shortness of breath. Negative for chest tightness and wheezing.   Cardiovascular: Positive for chest pain.  Gastrointestinal: Negative for abdominal distention, abdominal pain, diarrhea, nausea and vomiting.  Endocrine: Negative for polydipsia, polyphagia and polyuria.  Genitourinary: Negative for dysuria, frequency and hematuria.  Musculoskeletal: Negative for gait problem.  Skin: Negative for color change, pallor and rash.  Neurological: Negative for dizziness, syncope, light-headedness and headaches.  Hematological: Does not bruise/bleed easily.  Psychiatric/Behavioral: Negative for behavioral problems and confusion.     Physical Exam Updated Vital Signs BP 108/72 (BP Location: Right Arm)   Pulse 82   Temp 98.2 F (36.8 C) (Oral)   Resp (!) 33   SpO2 99%   Physical Exam    Constitutional: He is oriented to person, place, and time. He appears well-developed and well-nourished. No distress.  Sleeping intermittently. Awake and alert. Conversant with his daughter. No respiratory distress or obvious dyspnea. Not tachypneic, febrile, or tachycardic.  HENT:  Head: Normocephalic.  Eyes: Conjunctivae are normal. Pupils are equal, round, and reactive to light. No scleral icterus.  Neck: Normal range of motion. Neck supple. No thyromegaly present.  Cardiovascular: Normal rate and regular rhythm.  Exam reveals no gallop and no friction rub.   No murmur heard. Pulmonary/Chest: Effort normal and breath sounds normal. No respiratory distress. He has no wheezes. He has no rales.  Abdominal: Soft. Bowel sounds are normal. He exhibits no distension. There is no tenderness. There is no rebound.  Musculoskeletal: Normal range of motion.  Neurological: He is alert and oriented to person, place, and time.  Skin: Skin is warm and dry. No rash noted.  Psychiatric: He has a normal mood and affect. His behavior is normal.     ED Treatments / Results  Labs (all labs ordered are listed, but only abnormal results are displayed) Labs Reviewed  BASIC METABOLIC PANEL - Abnormal; Notable for the following:       Result Value   Sodium 128 (*)    Chloride 96 (*)    Glucose, Bld 363 (*)    Creatinine, Ser 1.86 (*)    Calcium 8.4 (*)    GFR calc non Af Amer 31 (*)    GFR calc Af Amer 36 (*)    All other components within normal limits  CBC - Abnormal; Notable for the following:    RBC 3.74 (*)    Hemoglobin 11.3 (*)    HCT 34.7 (*)    Platelets 142 (*)    All other components within normal limits  I-STAT TROPOININ, ED    EKG  EKG Interpretation  Date/Time:  Saturday October 27 2015 17:50:26 EDT Ventricular Rate:  95 PR Interval:    QRS Duration: 100 QT Interval:  380 QTC Calculation: 477 R Axis:   -44 Text Interpretation:  A. Fib Left axis deviation Anterior infarct ,  age undetermined Consider right ventricular involvement in acute inferior infarct Abnormal ECG Reconfirmed by Fayrene Fearing  MD, Cliffton Spradley ([id number]) on 10/27/2015 10:41:37 PM       Radiology No results found.  Procedures Procedures (including critical care time)  Medications Ordered in ED Medications  clindamycin (CLEOCIN) capsule 300 mg (300 mg Oral Given 10/27/15 2337)     Initial Impression / Assessment and Plan / ED Course  I have reviewed the  triage vital signs and the nursing notes.  Pertinent labs & imaging results that were available during my care of the patient were reviewed by me and considered in my medical decision making (see chart for details).  Clinical Course    Thrush noted on patient's exam. X-ray obtained. CT obtained and reviewed. Suggestion of mass and postobstructive pneumonia. No indication for admission at this time. Plan will be initial treatment with antibiotics. Nystatin. Primary care, and oncology follow-up. Daughter Durenda Age understanding. We discussed admission. She has strong preference for him not admitted to the hospital "if he didn't have to". Encouraged her to contact his physician tomorrow for immediate follow-up appointment and arrangement for oncology referral.  Final Clinical Impressions(s) / ED Diagnoses   Final diagnoses:  Lung mass  Thrush  Atrial fibrillation, unspecified type [facility])    New Prescriptions Discharge Medication List as of 10/27/2015 10:59 PM    START taking these medications   Details  clindamycin (CLEOCIN) 300 MG capsule Take 1 capsule (300 mg total) by mouth 3 (three) times daily., Starting Sat 10/27/2015, Print         Rolland Porter, MD 10/30/15 [phone]

## 2015-11-14 ENCOUNTER — Encounter: Payer: Self-pay | Admitting: Pulmonary Disease

## 2015-11-14 ENCOUNTER — Ambulatory Visit (INDEPENDENT_AMBULATORY_CARE_PROVIDER_SITE_OTHER): Payer: Medicare Other | Admitting: Pulmonary Disease

## 2015-11-14 VITALS — BP 126/70 | HR 91 | Ht 60.0 in | Wt 141.0 lb

## 2015-11-14 DIAGNOSIS — R918 Other nonspecific abnormal finding of lung field: Secondary | ICD-10-CM | POA: Diagnosis not present

## 2015-11-14 NOTE — Progress Notes (Signed)
[name]    [id number]    06/23/1926  Primary Care Physician:[name] Jonathon Cooter, MD  Referring Physician: [name], MD [address] [place], [place] [address]  Chief complaint:  Follow-up for abnormal CT scan  HPI: 82 y.o.malewith medical history significant of hypertension, hyperlipidemia, gout, depression, CAD, PUD, BPH, chronic combined systolic and diastolic CHF, carotid artery stenosis, AAA, anemia, PAF on Xarelto, who was admitted from 10/03/15-10/10/15 with HCAP pneumonia. He was treated with augmentin and xithromax. He was seen again in the emergency room in 9/15/174 with persistent cough. He had a chest x-ray and CT scan which redemonstrated the left upper lobe opacity. He was given another course of antibiotics, clindamycin for 7 days.  He is here for follow-up and reports that his respiratory issues have improved. He denies any cough, sputum production, fevers, chills. He does have some heartburn symptoms. He is here with his 2 daughters who speaks good [place] and a [place] Nurse, learning disability.  Outpatient Encounter Prescriptions as of 11/14/2015  Medication Sig  . allopurinol (ZYLOPRIM) 300 MG tablet Take 300 mg by mouth daily. To prevent gout  . arformoterol (BROVANA) 15 MCG/2ML NEBU Take 2 mLs (15 mcg total) by nebulization 2 (two) times daily.  [name] aspirin EC 81 MG tablet Take 1 tablet (81 mg total) by mouth daily.  [name] atorvastatin (LIPITOR) 40 MG tablet Take 1 tablet (40 mg total) by mouth daily at 6 PM.  . budesonide (PULMICORT) 0.5 MG/2ML nebulizer solution Take 2 mLs (0.5 mg total) by nebulization 2 (two) times daily.  . carvedilol (COREG) 3.125 MG tablet Take 1 tablet (3.125 mg total) by mouth 2 (two) times daily with a meal.  . dextromethorphan-guaiFENesin (MUCINEX DM) 30-600 MG 12hr tablet Take 1 tablet by mouth 2 (two) times daily.  [name] esomeprazole (NEXIUM) 40 MG capsule Take 1 capsule (40 mg total) by mouth daily.  [name] HYDROcodone-acetaminophen  (NORCO/VICODIN) 5-325 MG tablet Take 1 tablet by mouth every 4 (four) hours as needed.  [name] ipratropium-albuterol (DUONEB) 0.5-2.5 (3) MG/3ML SOLN Take 3 mLs by nebulization 2 (two) times daily. May use 1 additional dose during the day if needed  . isosorbide mononitrate (IMDUR) 30 MG 24 hr tablet Take 30 mg by mouth once daily (Patient taking differently: Take 30 mg by mouth daily. For control of heart pain)  . NITROSTAT 0.4 MG SL tablet PLACE 1 TABLET UNDER THE TONGUE EVERY 5 MINUTES FOR 3 DOSES AS NEEDED FOR CHEST PAIN  . nystatin (MYCOSTATIN) 100000 UNIT/ML suspension Take 5 mLs (500,000 Units total) by mouth 4 (four) times daily.  . Rivaroxaban (XARELTO) 15 MG TABS tablet Take 15 mg by mouth daily. To prevent strokes  . tamsulosin (FLOMAX) 0.4 MG CAPS capsule Take 0.4 mg by mouth daily. To improve bladder function  . benzonatate (TESSALON) 100 MG capsule Take 1 capsule (100 mg total) by mouth every 8 (eight) hours. (Patient not taking: Reported on 11/14/2015)  . [DISCONTINUED] amoxicillin-clavulanate (AUGMENTIN) 500-125 MG tablet Take 1 tablet (500 mg total) by mouth every 12 (twelve) hours. (Patient not taking: Reported on 11/14/2015)  . [DISCONTINUED] azithromycin (ZITHROMAX) 500 MG tablet Take 1 tablet (500 mg total) by mouth at bedtime. (Patient not taking: Reported on 11/14/2015)  . [DISCONTINUED] clindamycin (CLEOCIN) 300 MG capsule Take 1 capsule (300 mg total) by mouth 3 (three) times daily. (Patient not taking: Reported on 11/14/2015)  . [DISCONTINUED] predniSONE (DELTASONE) 20 MG tablet Take 3 tablets by mouth for 5  days then 2 tablets by mouth for 5 days then 1 tablet by mouth for 4 days then stop. (Patient not taking: Reported on 11/14/2015)   No facility-administered encounter medications on file as of 11/14/2015.     Allergies as of 11/14/2015 - Review Complete 11/14/2015  Allergen Reaction Noted  . Levofloxacin Anaphylaxis and Other (See Comments) 07/31/2015  . Eliquis [apixaban]  Itching and Swelling 07/31/2015  . Phenazopyridine hcl Other (See Comments) 12/15/2012  . Septra [sulfamethoxazole-trimethoprim] Nausea And Vomiting 12/15/2012  . Latex Rash 10/03/2015  . Tape Rash 10/03/2015    Past Medical History:  Diagnosis Date  . AAA (abdominal aortic aneurysm) (HCC)   . Anemia   . Asthma   . Carotid artery disease (HCC)   . CHF (congestive heart failure) (HCC)   . Coronary artery disease   . Depression   . Gout   . H. pylori infection   . Hard of hearing   . Hiatal hernia   . Hyperplasia, prostate   . Hypertension   . PUD (peptic ulcer disease)   . Renal failure, acute (HCC) 11/08/2012    Past Surgical History:  Procedure Laterality Date  . CAROTID STENT INSERTION  2015  . CAROTID STENT INSERTION N/A 05/09/2013   Procedure: CAROTID STENT INSERTION;  Surgeon: Runell GessJonathan J Berry, MD;  [facility];  Service: Cardiovascular;  Laterality: N/A;  . ESOPHAGOGASTRODUODENOSCOPY  06/13/2008,12/31/12  . LEFT HEART CATHETERIZATION WITH CORONARY ANGIOGRAM N/A 05/04/2013   Procedure: LEFT HEART CATHETERIZATION WITH CORONARY ANGIOGRAM;  Surgeon: Jonathon M SwazilandJordan, MD;  [facility];  Service: Cardiovascular;  Laterality: N/A;    Family History  Problem Relation Age of Onset  . Colon cancer Neg Hx     Social History   Social History  . Marital status: Married    Spouse name: [name]  . Number of children: 13  . Years of education: 4th grade   Occupational History  . retired    Social History Main Topics  . Smoking status: Former Smoker    Quit date: 02/17/2002  . Smokeless tobacco: Never Used     Comment: Quit seven years ago   . Alcohol use No  . Drug use: No  . Sexual activity: No   Other Topics Concern  . Not on file   Social History Narrative   Patient lives at home with his daughter   Patient is right handed    Patient drinks tea and coffee   Review of systems: Review of Systems  Constitutional: Negative for fever and chills.    HENT: Negative.   Eyes: Negative for blurred vision.  Respiratory: as per HPI  Cardiovascular: Negative for chest pain and palpitations.  Gastrointestinal: Negative for vomiting, diarrhea, blood per rectum. Genitourinary: Negative for dysuria, urgency, frequency and hematuria.  Musculoskeletal: Negative for myalgias, back pain and joint pain.  Skin: Negative for itching and rash.  Neurological: Negative for dizziness, tremors, focal weakness, seizures and loss of consciousness.  Endo/Heme/Allergies: Negative for environmental allergies.  Psychiatric/Behavioral: Negative for depression, suicidal ideas and hallucinations.  All other systems reviewed and are negative.  Physical Exam: Blood pressure 126/70, pulse 91, height 5' (1.524 m), weight 141 lb (64 kg), SpO2 99 %. Gen:      No acute distress HEENT:  EOMI, sclera anicteric Neck:     No masses; no thyromegaly Lungs:    Clear to auscultation bilaterally; normal respiratory effort CV:         Regular rate and  rhythm; no murmurs Abd:      + bowel sounds; soft, non-tender; no palpable masses, no distension Ext:    No edema; adequate peripheral perfusion Skin:      Warm and dry; no rash Neuro: alert and oriented x 3 Psych: normal mood and affect  Data Reviewed: CT scan 07/31/15- Stable aneurysm of aorta. No pulmonary mas or infiltrate. Emphysema CT scan 10/27/15- LUL mass. Images reviewed  Assessment:  #1 COPD Recent admission for acute exacerbation of COPD, pneumonia. He is making a slow recovery. He is now on Brovana and Pulmicort nebulizers. He be scheduled for pulmonary function tests for evaluation of symptoms.  #2 Abnormal CT scan CT scans over the past year reviewed. He had a CT scan in June of this year which did not show any pulmonary abnormality except for COPD. His follow-up CT scan after hospitalization for pneumonia shows a left upper lobe nodular opacity. My suspicion for cancer is low as this came up within a few months  after a normal CT scan. I believe the latest scan was done too soon after his recent pneumonia show any resolution. I'll repeat a CT scan in 2 months time. If abnormalities persist then we'll have to consider a bronchoscopy. He is at high risk given his COPD and Xarelto anticoagulation.   His wishes during the last hospitalization noted to be DNR/DNI. This was reconfirmed again today. I discussed with his daughters about how aggressive they want to be given if he have a diagnosis of cancer, as he may not tolerate chemotherapy, surgery. They're unsure of this and will discuss it further with him  Plan/Recommendations: - Continue Brovana, pulmicort - PFTs - Repeat CT of chest without contrast in 2 months.  Chilton Greathouse MD [facility] Pager [phone] 11/14/2015, 9:56 AM  CC: Jonathon Banner, MD

## 2015-11-14 NOTE — Patient Instructions (Signed)
We'll schedule you for a repeat CT of the chest without contrast in 2 month's time. Continue using your nebulizers as prescribed. We'll schedule you for pulmonary function test.  Return to clinic after the CT scan and pulmonary function tests for review and further workup as needed.

## 2016-01-02 ENCOUNTER — Ambulatory Visit (INDEPENDENT_AMBULATORY_CARE_PROVIDER_SITE_OTHER): Payer: Medicare Other | Admitting: Pulmonary Disease

## 2016-01-02 DIAGNOSIS — R918 Other nonspecific abnormal finding of lung field: Secondary | ICD-10-CM

## 2016-01-02 LAB — PULMONARY FUNCTION TEST
FEF 25-75 POST: 1.06 L/s
FEF 25-75 Pre: 0.88 L/sec
FEF2575-%Change-Post: 20 %
FEF2575-%PRED-PRE: 120 %
FEF2575-%Pred-Post: 145 %
FEV1-%Change-Post: 7 %
FEV1-%PRED-PRE: 114 %
FEV1-%Pred-Post: 123 %
FEV1-POST: 1.69 L
FEV1-PRE: 1.56 L
FEV1FVC-%Change-Post: 7 %
FEV1FVC-%PRED-PRE: 92 %
FEV6-%Change-Post: 1 %
FEV6-%PRED-POST: 126 %
FEV6-%PRED-PRE: 124 %
FEV6-POST: 2.39 L
FEV6-Pre: 2.36 L
FEV6FVC-%CHANGE-POST: 0 %
FEV6FVC-%PRED-POST: 110 %
FEV6FVC-%Pred-Pre: 109 %
FVC-%Change-Post: 0 %
FVC-%PRED-POST: 113 %
FVC-%Pred-Pre: 113 %
FVC-Post: 2.42 L
FVC-Pre: 2.41 L
POST FEV6/FVC RATIO: 99 %
PRE FEV1/FVC RATIO: 65 %
PRE FEV6/FVC RATIO: 98 %
Post FEV1/FVC ratio: 70 %

## 2016-01-29 ENCOUNTER — Ambulatory Visit (INDEPENDENT_AMBULATORY_CARE_PROVIDER_SITE_OTHER)
Admission: RE | Admit: 2016-01-29 | Discharge: 2016-01-29 | Disposition: A | Discharge status: Home / Self Care | Payer: Medicare Other | Source: Ambulatory Visit | Attending: Pulmonary Disease | Admitting: Pulmonary Disease

## 2016-01-29 DIAGNOSIS — R918 Other nonspecific abnormal finding of lung field: Secondary | ICD-10-CM

## 2016-02-06 ENCOUNTER — Ambulatory Visit: Payer: Medicare Other | Admitting: Pulmonary Disease

## 2016-02-13 ENCOUNTER — Encounter: Payer: Self-pay | Admitting: Cardiology

## 2016-02-13 ENCOUNTER — Ambulatory Visit (INDEPENDENT_AMBULATORY_CARE_PROVIDER_SITE_OTHER): Payer: Medicare Other | Admitting: Cardiology

## 2016-02-13 VITALS — BP 168/86 | HR 77 | Ht 61.0 in | Wt 148.8 lb

## 2016-02-13 DIAGNOSIS — N183 Chronic kidney disease, stage 3 unspecified: Secondary | ICD-10-CM

## 2016-02-13 DIAGNOSIS — I739 Peripheral vascular disease, unspecified: Secondary | ICD-10-CM

## 2016-02-13 DIAGNOSIS — J441 Chronic obstructive pulmonary disease with (acute) exacerbation: Secondary | ICD-10-CM

## 2016-02-13 DIAGNOSIS — Z79899 Other long term (current) drug therapy: Secondary | ICD-10-CM | POA: Diagnosis not present

## 2016-02-13 DIAGNOSIS — I2510 Atherosclerotic heart disease of native coronary artery without angina pectoris: Secondary | ICD-10-CM

## 2016-02-13 DIAGNOSIS — Z7901 Long term (current) use of anticoagulants: Secondary | ICD-10-CM

## 2016-02-13 DIAGNOSIS — I779 Disorder of arteries and arterioles, unspecified: Secondary | ICD-10-CM

## 2016-02-13 DIAGNOSIS — Z8673 Personal history of transient ischemic attack (TIA), and cerebral infarction without residual deficits: Secondary | ICD-10-CM

## 2016-02-13 DIAGNOSIS — I10 Essential (primary) hypertension: Secondary | ICD-10-CM

## 2016-02-13 DIAGNOSIS — I5022 Chronic systolic (congestive) heart failure: Secondary | ICD-10-CM

## 2016-02-13 DIAGNOSIS — I255 Ischemic cardiomyopathy: Secondary | ICD-10-CM | POA: Diagnosis not present

## 2016-02-13 LAB — CBC
HCT: 36.1 % — ABNORMAL LOW (ref 38.5–50.0)
Hemoglobin: 11.7 g/dL — ABNORMAL LOW (ref 13.2–17.1)
MCH: 29.5 pg (ref 27.0–33.0)
MCHC: 32.4 g/dL (ref 32.0–36.0)
MCV: 90.9 fL (ref 80.0–100.0)
MPV: 9.1 fL (ref 7.5–12.5)
Platelets: 193 10*3/uL (ref 140–400)
RBC: 3.97 MIL/uL — ABNORMAL LOW (ref 4.20–5.80)
RDW: 15.7 % — ABNORMAL HIGH (ref 11.0–15.0)
WBC: 8.8 10*3/uL (ref 3.8–10.8)

## 2016-02-13 LAB — BASIC METABOLIC PANEL
BUN: 15 mg/dL (ref 7–25)
CO2: 22 mmol/L (ref 20–31)
Calcium: 8.9 mg/dL (ref 8.6–10.3)
Chloride: 96 mmol/L — ABNORMAL LOW (ref 98–110)
Creat: 1.47 mg/dL — ABNORMAL HIGH (ref 0.70–1.11)
Glucose, Bld: 164 mg/dL — ABNORMAL HIGH (ref 65–99)
Potassium: 4.7 mmol/L (ref 3.5–5.3)
Sodium: 129 mmol/L — ABNORMAL LOW (ref 135–146)

## 2016-02-13 NOTE — Assessment & Plan Note (Signed)
AF today with CVR- as far as I can tell he is asymptomatic

## 2016-02-13 NOTE — Assessment & Plan Note (Signed)
Admitted with CAP Sept 2017

## 2016-02-13 NOTE — Assessment & Plan Note (Addendum)
Cath 04/24/13  1. 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 His most severe disease is in the small diagonal and LCx branches. -Medical Rx

## 2016-02-13 NOTE — Assessment & Plan Note (Signed)
CHADs VASc= 7. He is on Xarelto 15 mg

## 2016-02-13 NOTE — Assessment & Plan Note (Signed)
Last GFR 36

## 2016-02-13 NOTE — Assessment & Plan Note (Signed)
LICA PTA by Dr Allyson Sabal 2015

## 2016-02-13 NOTE — Assessment & Plan Note (Signed)
Documented CVAs by MRI

## 2016-02-13 NOTE — Progress Notes (Signed)
02/13/2016 [name]   1926-08-03  [id number]  Primary Physician [name], MD Primary Cardiologist: Dr Allyson Sabal  HPI:  83 y.o. [place] male with past medical history of CAD (cath 04/2013 showing 80% left main stenosis and CTO of RCA - medical management recommended), PAF (on Xarelto), ischemic cardiomyopathy (EF 40% by echo in 06/2014), chronic combined systolic and diastolic CHF, COPD, carotid artery stenosis-s/p LICA PTA by Dr Allyson Sabal 2015, HTN, and HLD. He is seen in the office today with n interpreter for a 6 month follow up. Carotid dopplers done in Jan 2017 looked good. He was admitted in Aug,Sep, and Oct with COPD issues but seems to be stable now. He has chronic DOE but no change per family. He denies chest pain, syncope, or palpitations.    Current Outpatient Prescriptions  Medication Sig Dispense Refill  . allopurinol (ZYLOPRIM) 300 MG tablet Take 300 mg by mouth daily. To prevent gout    . arformoterol (BROVANA) 15 MCG/2ML NEBU Take 2 mLs (15 mcg total) by nebulization 2 (two) times daily. 120 mL 2  . aspirin EC 81 MG tablet Take 1 tablet (81 mg total) by mouth daily. 90 tablet 3  . atorvastatin (LIPITOR) 40 MG tablet Take 1 tablet (40 mg total) by mouth daily at 6 PM. 30 tablet 5  . benzonatate (TESSALON) 100 MG capsule Take 1 capsule (100 mg total) by mouth every 8 (eight) hours. 21 capsule 0  . budesonide (PULMICORT) 0.5 MG/2ML nebulizer solution Take 2 mLs (0.5 mg total) by nebulization 2 (two) times daily. 2 mL 12  . carvedilol (COREG) 3.125 MG tablet Take 1 tablet (3.125 mg total) by mouth 2 (two) times daily with a meal. 60 tablet 5  . dextromethorphan-guaiFENesin (MUCINEX DM) 30-600 MG 12hr tablet Take 1 tablet by mouth 2 (two) times daily. 60 tablet 0  . esomeprazole (NEXIUM) 40 MG capsule Take 1 capsule (40 mg total) by mouth daily. 30 capsule 0  . ipratropium-albuterol (DUONEB) 0.5-2.5 (3) MG/3ML SOLN Take 3 mLs by nebulization 2 (two) times daily. May use 1  additional dose during the day if needed    . isosorbide mononitrate (IMDUR) 30 MG 24 hr tablet Take 30 mg by mouth once daily (Patient taking differently: Take 30 mg by mouth daily. For control of heart pain) 15 tablet 9  . NITROSTAT 0.4 MG SL tablet PLACE 1 TABLET UNDER THE TONGUE EVERY 5 MINUTES FOR 3 DOSES AS NEEDED FOR CHEST PAIN 25 tablet 5  . nystatin (MYCOSTATIN) 100000 UNIT/ML suspension Take 5 mLs (500,000 Units total) by mouth 4 (four) times daily. 120 mL 0  . Rivaroxaban (XARELTO) 15 MG TABS tablet Take 15 mg by mouth daily. To prevent strokes    . tamsulosin (FLOMAX) 0.4 MG CAPS capsule Take 0.4 mg by mouth daily. To improve bladder function     No current facility-administered medications for this visit.     Allergies  Allergen Reactions  . Levofloxacin Anaphylaxis and Other (See Comments)    Patient told his daughter that it made him "feel worse than he already did" (overall) Headache also (has refused to take anymore)  . Eliquis [Apixaban] Itching and Swelling  . Phenazopyridine Hcl Other (See Comments)    Unknown reaction  . Septra [Sulfamethoxazole-Trimethoprim] Nausea And Vomiting    GI Upset  . Latex Rash  . Tape Rash    Social History   Social History  . Marital status: Married    Spouse name: san  . Number  of children: 31  . Years of education: 4th grade   Occupational History  . retired    Social History Main Topics  . Smoking status: Former Smoker    Quit date: 02/17/2002  . Smokeless tobacco: Never Used     Comment: Quit seven years ago   . Alcohol use No  . Drug use: No  . Sexual activity: No   Other Topics Concern  . Not on file   Social History Narrative   Patient lives at home with his daughter   Patient is right handed    Patient drinks tea and coffee     Review of Systems: General: negative for chills, fever, night sweats or weight changes.  Cardiovascular: negative for chest pain, dyspnea on exertion, edema, orthopnea, palpitations,  paroxysmal nocturnal dyspnea or shortness of breath Dermatological: negative for rash Respiratory: negative for cough or wheezing Urologic: negative for hematuria Abdominal: negative for nausea, vomiting, diarrhea, bright red blood per rectum, melena, or hematemesis Neurologic: negative for visual changes, syncope, or dizziness All other systems reviewed and are otherwise negative except as noted above.    Blood pressure (!) 168/86, pulse 77, height 5\' 1"  (1.549 m), weight 148 lb 12.8 oz (67.5 kg).  General appearance: alert, cooperative, appears stated age and no distress Neck: no carotid bruit and no JVD Lungs: Lt basilar rhonchi Heart: irregularly irregular rhythm Extremities: no edema Skin: Skin color, texture, turgor normal. No rashes or lesions Neurologic: Grossly normal  EKG AF with CVR  ASSESSMENT AND PLAN:   Paroxysmal atrial fibrillation (HCC) AF today with CVR- as far as I can tell he is asymptomatic  Chronic anticoagulation CHADs VASc= 7. He is on Xarelto 15 mg   Chronic systolic heart failure (HCC) Last EF 40-45% Aug 2017 Compensated on exam  History of CVA (cerebrovascular accident) Documented CVAs by MRI  Coronary artery disease Cath 04/24/13 3 vessel obstructive CAD with CTO of the RCA. His most severe disease is in the small diagonal and LCx branches. -Medical Rx- no angina  COPD exacerbation [facility] [place]) Admitted with CAP Sept 2017  CKD (chronic kidney disease), stage III Last GFR 36  Hypertension Controlled  Carotid artery disease (HCC) LICA PTA by Dr Allyson Sabal 2015   PLAN  He is due for labs and carotid doppler. F/U with dr Allyson Sabal in 6 months. No change in his medications.   Corine Shelter PA-C 02/13/2016 11:46 AM

## 2016-02-13 NOTE — Assessment & Plan Note (Signed)
Last EF 40-45% Aug 2017 Compensated on exam

## 2016-02-13 NOTE — Assessment & Plan Note (Signed)
Controlled.  

## 2016-02-13 NOTE — Patient Instructions (Signed)
Labs today -- BMP, CBC    SCHEDULE AT [address] Your physician has requested that you have a carotid duplex. This test is an ultrasound of the carotid arteries in your neck. It looks at blood flow through these arteries that supply the brain with blood. Allow one hour for this exam. There are no restrictions or special instructions.   NO CHANGE TO MEDICATIONS   Your physician recommends that you schedule a follow-up appointment in 6 MONTHS WITH DR BERRY.  If you need a refill on your cardiac medications before your next appointment, please call your pharmacy.

## 2016-02-26 ENCOUNTER — Ambulatory Visit: Payer: Medicare Other | Admitting: Pulmonary Disease

## 2016-02-27 ENCOUNTER — Ambulatory Visit: Payer: Medicare Other | Admitting: Pulmonary Disease

## 2016-03-05 ENCOUNTER — Encounter (HOSPITAL_COMMUNITY): Payer: Medicare Other

## 2016-03-12 ENCOUNTER — Ambulatory Visit (HOSPITAL_COMMUNITY)
Admission: RE | Admit: 2016-03-12 | Discharge: 2016-03-12 | Disposition: A | Discharge status: Home / Self Care | Payer: Medicare Other | Source: Ambulatory Visit | Attending: Cardiovascular Disease | Admitting: Cardiovascular Disease

## 2016-03-12 DIAGNOSIS — I255 Ischemic cardiomyopathy: Secondary | ICD-10-CM

## 2016-03-12 DIAGNOSIS — Z79899 Other long term (current) drug therapy: Secondary | ICD-10-CM

## 2016-03-12 DIAGNOSIS — I259 Chronic ischemic heart disease, unspecified: Secondary | ICD-10-CM

## 2016-03-12 DIAGNOSIS — I779 Disorder of arteries and arterioles, unspecified: Secondary | ICD-10-CM

## 2016-03-12 DIAGNOSIS — I739 Peripheral vascular disease, unspecified: Secondary | ICD-10-CM

## 2016-03-13 ENCOUNTER — Encounter: Payer: Self-pay | Admitting: Pulmonary Disease

## 2016-03-13 ENCOUNTER — Ambulatory Visit (INDEPENDENT_AMBULATORY_CARE_PROVIDER_SITE_OTHER): Payer: Medicare Other | Admitting: Pulmonary Disease

## 2016-03-13 VITALS — BP 158/78 | HR 75 | Ht 60.0 in | Wt 147.0 lb

## 2016-03-13 DIAGNOSIS — J449 Chronic obstructive pulmonary disease, unspecified: Secondary | ICD-10-CM | POA: Diagnosis not present

## 2016-03-13 DIAGNOSIS — R918 Other nonspecific abnormal finding of lung field: Secondary | ICD-10-CM

## 2016-03-13 NOTE — Progress Notes (Signed)
[name]    [id number]    [birth year]  Primary Care Physician:WILSON,FRED Jonathon Cooter, MD  Referring Physician: [name], MD [address] [place], [place] [address]  Chief complaint:  Follow-up for  Abnormal CT scan with LUL opacity. COPD GOLD B (CAT score 13, 1 exacerbation over past year)  HPI: 83 y.o.malewith medical history significant of hypertension, hyperlipidemia, gout, depression, CAD, PUD, BPH, chronic combined systolic and diastolic CHF, carotid artery stenosis, AAA, anemia, PAF on Xarelto, who was admitted from 10/03/15-10/10/15 with HCAP pneumonia. He was treated with augmentin and xithromax. He was seen again in the emergency room in 9/15/174 with persistent cough. He had a chest x-ray and CT scan which redemonstrated the left upper lobe opacity. He was given another course of antibiotics, clindamycin for 7 days.  Interim History: He is here for follow-up. He is maintained on Brovana and Pulmicort nebs. He reports that his breathing issues are better. He had a repeat CT scan of the chest which shows near complete resolution of the left upper lobe opacity with residual scarring. Also noted are emphysema and stable aortic aneurysm. He is here with one of his 2 daughters and a [place] interpreter.  Outpatient Encounter Prescriptions as of 03/13/2016  Medication Sig  . allopurinol (ZYLOPRIM) 300 MG tablet Take 300 mg by mouth daily. To prevent gout  . arformoterol (BROVANA) 15 MCG/2ML NEBU Take 2 mLs (15 mcg total) by nebulization 2 (two) times daily.  [name] aspirin EC 81 MG tablet Take 1 tablet (81 mg total) by mouth daily.  [name] atorvastatin (LIPITOR) 40 MG tablet Take 1 tablet (40 mg total) by mouth daily at 6 PM.  . benzonatate (TESSALON) 100 MG capsule Take 1 capsule (100 mg total) by mouth every 8 (eight) hours.  . budesonide (PULMICORT) 0.5 MG/2ML nebulizer solution Take 2 mLs (0.5 mg total) by nebulization 2 (two) times daily.  . carvedilol (COREG) 3.125 MG  tablet Take 1 tablet (3.125 mg total) by mouth 2 (two) times daily with a meal.  . dextromethorphan-guaiFENesin (MUCINEX DM) 30-600 MG 12hr tablet Take 1 tablet by mouth 2 (two) times daily.  [name] esomeprazole (NEXIUM) 40 MG capsule Take 1 capsule (40 mg total) by mouth daily.  [name] ipratropium-albuterol (DUONEB) 0.5-2.5 (3) MG/3ML SOLN Take 3 mLs by nebulization 2 (two) times daily. May use 1 additional dose during the day if needed  . isosorbide mononitrate (IMDUR) 30 MG 24 hr tablet Take 30 mg by mouth once daily (Patient taking differently: Take 30 mg by mouth daily. For control of heart pain)  . NITROSTAT 0.4 MG SL tablet PLACE 1 TABLET UNDER THE TONGUE EVERY 5 MINUTES FOR 3 DOSES AS NEEDED FOR CHEST PAIN  . nystatin (MYCOSTATIN) 100000 UNIT/ML suspension Take 5 mLs (500,000 Units total) by mouth 4 (four) times daily.  . Rivaroxaban (XARELTO) 15 MG TABS tablet Take 15 mg by mouth daily. To prevent strokes  . tamsulosin (FLOMAX) 0.4 MG CAPS capsule Take 0.4 mg by mouth daily. To improve bladder function   No facility-administered encounter medications on file as of 03/13/2016.     Allergies as of 03/13/2016 - Review Complete 03/13/2016  Allergen Reaction Noted  . Levofloxacin Anaphylaxis and Other (See Comments) 07/31/2015  . Eliquis [apixaban] Itching and Swelling 07/31/2015  . Phenazopyridine hcl Other (See Comments) 12/15/2012  . Septra [sulfamethoxazole-trimethoprim] Nausea And Vomiting 12/15/2012  . Latex Rash 10/03/2015  . Tape Rash 10/03/2015    Past Medical  History:  Diagnosis Date  . AAA (abdominal aortic aneurysm) (HCC)   . Anemia   . Asthma   . Carotid artery disease (HCC)   . CHF (congestive heart failure) (HCC)   . Coronary artery disease   . Depression   . Gout   . H. pylori infection   . Hard of hearing   . Hiatal hernia   . Hyperplasia, prostate   . Hypertension   . PUD (peptic ulcer disease)   . Renal failure, acute (HCC) 11/08/2012    Past Surgical History:    Procedure Laterality Date  . CAROTID STENT INSERTION  2015  . CAROTID STENT INSERTION N/A 05/09/2013   Procedure: CAROTID STENT INSERTION;  Surgeon: Runell Gess, MD;  Location: [facility] CATH LAB;  Service: Cardiovascular;  Laterality: N/A;  . ESOPHAGOGASTRODUODENOSCOPY  06/13/2008,12/31/12  . LEFT HEART CATHETERIZATION WITH CORONARY ANGIOGRAM N/A 05/04/2013   Procedure: LEFT HEART CATHETERIZATION WITH CORONARY ANGIOGRAM;  Surgeon: Peter M Swaziland, MD;  Location[facility] LAB;  Service: Cardiovascular;  Laterality: N/A;    Family History  Problem Relation Age of Onset  . Colon cancer Neg Hx     Social History   Social History  . Marital status: Married    Spouse name: san  . Number of children: 13  . Years of education: 4th grade   Occupational History  . retired    Social History Main Topics  . Smoking status: Former Smoker    Quit date: 02/17/2002  . Smokeless tobacco: Never Used     Comment: Quit seven years ago   . Alcohol use No  . Drug use: No  . Sexual activity: No   Other Topics Concern  . Not on file   Social History Narrative   Patient lives at home with his daughter   Patient is right handed    Patient drinks tea and coffee   Review of systems: Review of Systems  Constitutional: Negative for fever and chills.  HENT: Negative.   Eyes: Negative for blurred vision.  Respiratory: as per HPI  Cardiovascular: Negative for chest pain and palpitations.  Gastrointestinal: Negative for vomiting, diarrhea, blood per rectum. Genitourinary: Negative for dysuria, urgency, frequency and hematuria.  Musculoskeletal: Negative for myalgias, back pain and joint pain.  Skin: Negative for itching and rash.  Neurological: Negative for dizziness, tremors, focal weakness, seizures and loss of consciousness.  Endo/Heme/Allergies: Negative for environmental allergies.  Psychiatric/Behavioral: Negative for depression, suicidal ideas and hallucinations.  All other systems reviewed and  are negative.  Physical Exam: Blood pressure (!) 158/78, pulse 75, height 5' (1.524 m), weight 147 lb (66.7 kg), SpO2 97 %. Gen:      No acute distress HEENT:  EOMI, sclera anicteric Neck:     No masses; no thyromegaly Lungs:    Clear to auscultation bilaterally; normal respiratory effort CV:         Regular rate and rhythm; no murmurs Abd:      + bowel sounds; soft, non-tender; no palpable masses, no distension Ext:    No edema; adequate peripheral perfusion Skin:      Warm and dry; no rash. Tattoos notes on chest and back. Neuro: alert and oriented x 3 Psych: normal mood and affect  Data Reviewed: CT scan 07/31/15- Stable aneurysm of aorta. No pulmonary mas or infiltrate. Emphysema CT scan 10/27/15- LUL mass.  CT scan 01/29/16-near-complete resolution of the left upper lobe mass with residual scarring. Centrilobular emphysema again demonstrated. Thoracic aortic aneurysm seen.  Images reviewed  PFTs from/22/17 FVC 2.42 [113%) FEV1 1.69 [123%] F/F 70 Mild obstructive airway disease. Patient unable to perform DLCO and lung volumes.  Assessment:  #1 COPD GOLD B PFTs reviewed with the daughter. PFTs show mild obstructive avid disease but the CT scan Brovana and Pulmicort nebs and we will continue the same.  #2 Abnormal CT scan. I have reviewed the latest CT scan from December with the patient and his family. The left upper lobe infiltrate has nearly completely resolved with some residual opacity which is likely scarring. In retrospect the left upper lobe opacity was likely secondary to pneumonia. There is no evidence of malignancy.  Also noted on CT scan is the thoracic aneurysm. I will refer him back to his primary care for follow up and BP management. We can keep an eye on the residual LUL opacity on the follow CT scans for the aneurysm.   His wishes during the last hospitalization noted to be DNR/DNI. This was reconfirmed again at last visit.  Plan/Recommendations: - Continue  Brovana, pulmicort - Refer to PCP for follow up of his aortic aneurym  Chilton Greathouse MD [facility] Pager [phone] 03/13/2016, 9:12 AM  CC: [name], MD

## 2016-03-13 NOTE — Patient Instructions (Signed)
I have reviewed the PFTs and the CT scan with you. The CT scan shows near complete resolution of the left upper lobe infiltrate so this was probably secondary to pneumonia.  There is a stable thoracic aortic aneurysm which will need to be followed by annual CT scans.   I will let your primary care know about the aneurysm for follow up and BP management.   Return in 6 months.

## 2016-03-21 ENCOUNTER — Telehealth: Payer: Self-pay | Admitting: *Deleted

## 2016-03-21 NOTE — Telephone Encounter (Signed)
Left msg for daughter to call. 

## 2016-03-21 NOTE — Telephone Encounter (Signed)
-----   Message from Abelino Derrick, [place] sent at 03/20/2016  3:45 PM EST ----- Please let pt know his carotid doppler looked good- follow up stud in one year.  Corine Shelter PA-C 03/20/2016 3:45 PM

## 2016-03-25 ENCOUNTER — Other Ambulatory Visit: Payer: Self-pay | Admitting: Cardiology

## 2016-03-26 ENCOUNTER — Telehealth: Payer: Self-pay | Admitting: Cardiology

## 2016-03-26 NOTE — Telephone Encounter (Signed)
Results of carotid US and recommendations discussed with patient's daughter, who verbalized understanding and thanks.

## 2016-03-26 NOTE — Telephone Encounter (Signed)
New Message  ° ° ° °Pt daughter is returning your call °

## 2016-07-18 ENCOUNTER — Emergency Department (HOSPITAL_COMMUNITY): Payer: Medicare Other

## 2016-07-18 ENCOUNTER — Inpatient Hospital Stay (HOSPITAL_COMMUNITY): Payer: Medicare Other

## 2016-07-18 ENCOUNTER — Inpatient Hospital Stay (HOSPITAL_COMMUNITY)
Admission: EM | Admit: 2016-07-18 | Discharge: 2016-07-21 | DRG: 065 | Disposition: A | Discharge status: Home / Self Care | Payer: Medicare Other | Attending: Family Medicine | Admitting: Family Medicine

## 2016-07-18 ENCOUNTER — Encounter (HOSPITAL_COMMUNITY): Payer: Self-pay | Admitting: *Deleted

## 2016-07-18 DIAGNOSIS — F329 Major depressive disorder, single episode, unspecified: Secondary | ICD-10-CM | POA: Diagnosis present

## 2016-07-18 DIAGNOSIS — I6300 Cerebral infarction due to thrombosis of unspecified precerebral artery: Secondary | ICD-10-CM | POA: Diagnosis not present

## 2016-07-18 DIAGNOSIS — N183 Chronic kidney disease, stage 3 unspecified: Secondary | ICD-10-CM | POA: Diagnosis present

## 2016-07-18 DIAGNOSIS — Z881 Allergy status to other antibiotic agents status: Secondary | ICD-10-CM

## 2016-07-18 DIAGNOSIS — I480 Paroxysmal atrial fibrillation: Secondary | ICD-10-CM | POA: Diagnosis present

## 2016-07-18 DIAGNOSIS — E785 Hyperlipidemia, unspecified: Secondary | ICD-10-CM | POA: Diagnosis present

## 2016-07-18 DIAGNOSIS — J449 Chronic obstructive pulmonary disease, unspecified: Secondary | ICD-10-CM | POA: Diagnosis present

## 2016-07-18 DIAGNOSIS — I714 Abdominal aortic aneurysm, without rupture, unspecified: Secondary | ICD-10-CM | POA: Diagnosis present

## 2016-07-18 DIAGNOSIS — Z789 Other specified health status: Secondary | ICD-10-CM | POA: Diagnosis present

## 2016-07-18 DIAGNOSIS — Z888 Allergy status to other drugs, medicaments and biological substances status: Secondary | ICD-10-CM

## 2016-07-18 DIAGNOSIS — I360 Nonrheumatic tricuspid (valve) stenosis: Secondary | ICD-10-CM

## 2016-07-18 DIAGNOSIS — I10 Essential (primary) hypertension: Secondary | ICD-10-CM | POA: Diagnosis present

## 2016-07-18 DIAGNOSIS — I272 Pulmonary hypertension, unspecified: Secondary | ICD-10-CM | POA: Diagnosis present

## 2016-07-18 DIAGNOSIS — Z91040 Latex allergy status: Secondary | ICD-10-CM

## 2016-07-18 DIAGNOSIS — E871 Hypo-osmolality and hyponatremia: Secondary | ICD-10-CM | POA: Diagnosis present

## 2016-07-18 DIAGNOSIS — I130 Hypertensive heart and chronic kidney disease with heart failure and stage 1 through stage 4 chronic kidney disease, or unspecified chronic kidney disease: Secondary | ICD-10-CM | POA: Diagnosis present

## 2016-07-18 DIAGNOSIS — Z87891 Personal history of nicotine dependence: Secondary | ICD-10-CM

## 2016-07-18 DIAGNOSIS — D649 Anemia, unspecified: Secondary | ICD-10-CM | POA: Diagnosis present

## 2016-07-18 DIAGNOSIS — Z7901 Long term (current) use of anticoagulants: Secondary | ICD-10-CM | POA: Diagnosis not present

## 2016-07-18 DIAGNOSIS — I2510 Atherosclerotic heart disease of native coronary artery without angina pectoris: Secondary | ICD-10-CM | POA: Diagnosis present

## 2016-07-18 DIAGNOSIS — I779 Disorder of arteries and arterioles, unspecified: Secondary | ICD-10-CM | POA: Diagnosis not present

## 2016-07-18 DIAGNOSIS — R471 Dysarthria and anarthria: Secondary | ICD-10-CM

## 2016-07-18 DIAGNOSIS — I252 Old myocardial infarction: Secondary | ICD-10-CM

## 2016-07-18 DIAGNOSIS — R2990 Unspecified symptoms and signs involving the nervous system: Secondary | ICD-10-CM | POA: Insufficient documentation

## 2016-07-18 DIAGNOSIS — Z7982 Long term (current) use of aspirin: Secondary | ICD-10-CM | POA: Diagnosis not present

## 2016-07-18 DIAGNOSIS — R29898 Other symptoms and signs involving the musculoskeletal system: Secondary | ICD-10-CM

## 2016-07-18 DIAGNOSIS — M109 Gout, unspecified: Secondary | ICD-10-CM | POA: Diagnosis present

## 2016-07-18 DIAGNOSIS — I5022 Chronic systolic (congestive) heart failure: Secondary | ICD-10-CM | POA: Diagnosis present

## 2016-07-18 DIAGNOSIS — Z8673 Personal history of transient ischemic attack (TIA), and cerebral infarction without residual deficits: Secondary | ICD-10-CM | POA: Diagnosis not present

## 2016-07-18 DIAGNOSIS — R29711 NIHSS score 11: Secondary | ICD-10-CM | POA: Diagnosis present

## 2016-07-18 DIAGNOSIS — Z79899 Other long term (current) drug therapy: Secondary | ICD-10-CM

## 2016-07-18 DIAGNOSIS — I639 Cerebral infarction, unspecified: Secondary | ICD-10-CM | POA: Diagnosis present

## 2016-07-18 DIAGNOSIS — R4701 Aphasia: Secondary | ICD-10-CM | POA: Diagnosis present

## 2016-07-18 DIAGNOSIS — I739 Peripheral vascular disease, unspecified: Secondary | ICD-10-CM | POA: Diagnosis present

## 2016-07-18 DIAGNOSIS — I6319 Cerebral infarction due to embolism of other precerebral artery: Secondary | ICD-10-CM | POA: Diagnosis not present

## 2016-07-18 DIAGNOSIS — I63441 Cerebral infarction due to embolism of right cerebellar artery: Principal | ICD-10-CM | POA: Diagnosis present

## 2016-07-18 LAB — PROTIME-INR
INR: 1.19
Prothrombin Time: 15.2 seconds (ref 11.4–15.2)

## 2016-07-18 LAB — LIPID PANEL
CHOL/HDL RATIO: 3.3 ratio
CHOLESTEROL: 100 mg/dL (ref 0–200)
HDL: 30 mg/dL — AB (ref >40)
LDL Cholesterol: 42 mg/dL (ref 0–99)
Triglycerides: 138 mg/dL (ref <150)
VLDL: 28 mg/dL (ref 0–40)

## 2016-07-18 LAB — DIFFERENTIAL
Basophils Absolute: 0 10*3/uL (ref 0.0–0.1)
Basophils Relative: 0 %
EOS ABS: 0.1 10*3/uL (ref 0.0–0.7)
EOS PCT: 1 %
Lymphocytes Relative: 20 %
Lymphs Abs: 1.5 10*3/uL (ref 0.7–4.0)
MONO ABS: 0.4 10*3/uL (ref 0.1–1.0)
MONOS PCT: 5 %
Neutro Abs: 5.3 10*3/uL (ref 1.7–7.7)
Neutrophils Relative %: 74 %

## 2016-07-18 LAB — APTT: aPTT: 45 seconds — ABNORMAL HIGH (ref 24–36)

## 2016-07-18 LAB — COMPREHENSIVE METABOLIC PANEL
ALT: 14 U/L — AB (ref 17–63)
AST: 25 U/L (ref 15–41)
Albumin: 3.3 g/dL — ABNORMAL LOW (ref 3.5–5.0)
Alkaline Phosphatase: 83 U/L (ref 38–126)
Anion gap: 7 (ref 5–15)
BUN: 15 mg/dL (ref 6–20)
CO2: 24 mmol/L (ref 22–32)
CREATININE: 1.5 mg/dL — AB (ref 0.61–1.24)
Calcium: 8.5 mg/dL — ABNORMAL LOW (ref 8.9–10.3)
Chloride: 93 mmol/L — ABNORMAL LOW (ref 101–111)
GFR, EST AFRICAN AMERICAN: 46 mL/min — AB (ref >60)
GFR, EST NON AFRICAN AMERICAN: 40 mL/min — AB (ref >60)
Glucose, Bld: 111 mg/dL — ABNORMAL HIGH (ref 65–99)
POTASSIUM: 3.9 mmol/L (ref 3.5–5.1)
Sodium: 124 mmol/L — ABNORMAL LOW (ref 135–145)
Total Bilirubin: 1.3 mg/dL — ABNORMAL HIGH (ref 0.3–1.2)
Total Protein: 7.1 g/dL (ref 6.5–8.1)

## 2016-07-18 LAB — RETICULOCYTES
RBC.: 3.85 MIL/uL — ABNORMAL LOW (ref 4.22–5.81)
Retic Count, Absolute: 69.3 10*3/uL (ref 19.0–186.0)
Retic Ct Pct: 1.8 % (ref 0.4–3.1)

## 2016-07-18 LAB — IRON AND TIBC
IRON: 58 ug/dL (ref 45–182)
SATURATION RATIOS: 18 % (ref 17.9–39.5)
TIBC: 321 ug/dL (ref 250–450)
UIBC: 263 ug/dL

## 2016-07-18 LAB — CBC
HEMATOCRIT: 30.6 % — AB (ref 39.0–52.0)
Hemoglobin: 10.2 g/dL — ABNORMAL LOW (ref 13.0–17.0)
MCH: 30.3 pg (ref 26.0–34.0)
MCHC: 33.3 g/dL (ref 30.0–36.0)
MCV: 90.8 fL (ref 78.0–100.0)
Platelets: 144 10*3/uL — ABNORMAL LOW (ref 150–400)
RBC: 3.37 MIL/uL — ABNORMAL LOW (ref 4.22–5.81)
RDW: 15.1 % (ref 11.5–15.5)
WBC: 7.3 10*3/uL (ref 4.0–10.5)

## 2016-07-18 LAB — I-STAT CHEM 8, ED
BUN: 17 mg/dL (ref 6–20)
CREATININE: 1.6 mg/dL — AB (ref 0.61–1.24)
Calcium, Ion: 1.16 mmol/L (ref 1.15–1.40)
Chloride: 92 mmol/L — ABNORMAL LOW (ref 101–111)
GLUCOSE: 109 mg/dL — AB (ref 65–99)
HEMATOCRIT: 32 % — AB (ref 39.0–52.0)
HEMOGLOBIN: 10.9 g/dL — AB (ref 13.0–17.0)
Potassium: 4 mmol/L (ref 3.5–5.1)
Sodium: 128 mmol/L — ABNORMAL LOW (ref 135–145)
TCO2: 25 mmol/L (ref 0–100)

## 2016-07-18 LAB — OCCULT BLOOD X 1 CARD TO LAB, STOOL: FECAL OCCULT BLD: NEGATIVE

## 2016-07-18 LAB — VITAMIN B12: Vitamin B-12: 602 pg/mL (ref 180–914)

## 2016-07-18 LAB — I-STAT TROPONIN, ED: TROPONIN I, POC: 0 ng/mL (ref 0.00–0.08)

## 2016-07-18 LAB — OSMOLALITY: Osmolality: 269 mOsm/kg — ABNORMAL LOW (ref 275–295)

## 2016-07-18 LAB — PHOSPHORUS: Phosphorus: 3.8 mg/dL (ref 2.5–4.6)

## 2016-07-18 LAB — ECHOCARDIOGRAM COMPLETE

## 2016-07-18 LAB — FOLATE: FOLATE: 24.4 ng/mL (ref >5.9)

## 2016-07-18 LAB — CBG MONITORING, ED: Glucose-Capillary: 115 mg/dL — ABNORMAL HIGH (ref 65–99)

## 2016-07-18 LAB — MAGNESIUM: Magnesium: 1.6 mg/dL — ABNORMAL LOW (ref 1.7–2.4)

## 2016-07-18 LAB — FERRITIN: Ferritin: 102 ng/mL (ref 24–336)

## 2016-07-18 MED ORDER — IOPAMIDOL (ISOVUE-370) INJECTION 76%
INTRAVENOUS | Status: AC
  Administered 2016-07-18: 90 mL
  Filled 2016-07-18: qty 100

## 2016-07-18 MED ORDER — BUDESONIDE 0.5 MG/2ML IN SUSP
0.5000 mg | Freq: Two times a day (BID) | RESPIRATORY_TRACT | Status: DC
  Administered 2016-07-18 – 2016-07-20 (×5): 0.5 mg via RESPIRATORY_TRACT
  Filled 2016-07-18 (×9): qty 2

## 2016-07-18 MED ORDER — ACETAMINOPHEN 650 MG RE SUPP: 650.0000 mg | RECTAL | Status: DC | PRN

## 2016-07-18 MED ORDER — HYDRALAZINE HCL 20 MG/ML IJ SOLN: 10.0000 mg | Freq: Three times a day (TID) | INTRAMUSCULAR | Status: DC | PRN

## 2016-07-18 MED ORDER — SODIUM CHLORIDE 0.9 % IV SOLN
INTRAVENOUS | Status: AC
  Administered 2016-07-18: 10:00:00 via INTRAVENOUS

## 2016-07-18 MED ORDER — ACETAMINOPHEN 325 MG PO TABS
650.0000 mg | ORAL_TABLET | ORAL | Status: DC | PRN
  Administered 2016-07-18: 650 mg via ORAL
  Filled 2016-07-18: qty 2

## 2016-07-18 MED ORDER — IPRATROPIUM-ALBUTEROL 0.5-2.5 (3) MG/3ML IN SOLN
3.0000 mL | Freq: Two times a day (BID) | RESPIRATORY_TRACT | Status: DC
  Administered 2016-07-18 – 2016-07-20 (×4): 3 mL via RESPIRATORY_TRACT
  Filled 2016-07-18 (×4): qty 3

## 2016-07-18 MED ORDER — STROKE: EARLY STAGES OF RECOVERY BOOK
Freq: Once | Status: AC
  Administered 2016-07-18: 18:00:00
  Filled 2016-07-18: qty 1

## 2016-07-18 MED ORDER — ASPIRIN 300 MG RE SUPP: 300.0000 mg | Freq: Every day | RECTAL | Status: DC

## 2016-07-18 MED ORDER — ASPIRIN 325 MG PO TABS
325.0000 mg | ORAL_TABLET | Freq: Every day | ORAL | Status: DC
  Administered 2016-07-18 – 2016-07-20 (×3): 325 mg via ORAL
  Filled 2016-07-18 (×4): qty 1

## 2016-07-18 MED ORDER — ACETAMINOPHEN 160 MG/5ML PO SOLN
650.0000 mg | ORAL | Status: DC | PRN
  Administered 2016-07-19: 650 mg
  Filled 2016-07-18: qty 20.3

## 2016-07-18 NOTE — ED Notes (Signed)
Report called to rn on 5 m 

## 2016-07-18 NOTE — Code Documentation (Signed)
Responded to Code Stroke called at 0345 for acute aphasia and weakness.  Family reports pt was last seen normal at 10 pm when he went to bed on 07/17/16 and when he awoke at 0200, he was unable to lift his arms and had difficulty speaking.    CT head, CTA and CT perfusion conducted. Pt was out of the window for TPA. No large vessel occlusion present.  CTA reveals right M1 stenosis  Initial NIHSS was 11.  On exam the pt is drowsy but arousable.   He was unable to articulate any comprehensible speech, but did follow simple commands. Partial hemianopia was present on the left.  He displayed weakness and significant drift with his left arm.    Per neurology, pt to receive MRI of brain and is pending admission for further workup.

## 2016-07-18 NOTE — Evaluation (Signed)
Physical Therapy Evaluation Patient Details Name: [name] MRN: [id number] DOB: 08-16-1926 Today's Date: 07/18/2016   History of Present Illness  Pt adm with LUE weakness and decr responsiveness. MRI showed acute infarct rt precentral gyrus. PMH -HTN, gout, CAD, CHF, AAA, CVA, copd  Clinical Impression  Pt admitted with above diagnosis and presents to PT with functional limitations due to deficits listed below (See PT problem list). Pt needs skilled PT to maximize independence and safety to allow discharge to home with family. Pt close to baseline per family. Will follow acutely but doubt he will need PT after DC.     Follow Up Recommendations No PT follow up;Supervision/Assistance - 24 hour    Equipment Recommendations  None recommended by PT    Recommendations for Other Services       Precautions / Restrictions Precautions Precautions: Fall      Mobility  Bed Mobility Overal bed mobility: Modified Independent             General bed mobility comments: Incr time  Transfers Overall transfer level: Needs assistance Equipment used: Rolling walker (2 wheeled);None Transfers: Sit to/from Stand Sit to Stand: Min guard         General transfer comment: Assist for safety  Ambulation/Gait Ambulation/Gait assistance: Min guard Ambulation Distance (Feet): 250 Feet Assistive device: Rolling walker (2 wheeled);None Gait Pattern/deviations: Step-through pattern;Decreased step length - right;Decreased step length - left;Trunk flexed Gait velocity: decr   General Gait Details: When using rolling walker pt needs visual and tactile cues to stay closer to the walker. Pt amb a short distance without walker and has better posture  Stairs            Wheelchair Mobility    Modified Rankin (Stroke Patients Only)       Balance Overall balance assessment: Needs assistance Sitting-balance support: No upper extremity supported;Feet supported Sitting balance-Leahy  Scale: Good     Standing balance support: No upper extremity supported Standing balance-Leahy Scale: Fair                               Pertinent Vitals/Pain Pain Assessment: Faces Faces Pain Scale: No hurt    Home Living Family/patient expects to be discharged to:: Private residence Living Arrangements: Spouse/significant other;Children Available Help at Discharge: Family;Available 24 hours/day Type of Home: House Home Access: Stairs to enter Entrance Stairs-Rails: None Entrance Stairs-Number of Steps: 3 Home Layout: One level Home Equipment: Walker - 2 wheels;Cane - single point      Prior Function Level of Independence: Needs assistance   Gait / Transfers Assistance Needed: Pt amb without assistive device most of time. Family assist at times. Daughter reports pt tends to push RW too far out in front            Hand Dominance   Dominant Hand: Right    Extremity/Trunk Assessment   Upper Extremity Assessment Upper Extremity Assessment: Defer to OT evaluation    Lower Extremity Assessment Lower Extremity Assessment: Generalized weakness       Communication   Communication: Prefers language other than English (Daughter interpreted. )  Cognition Arousal/Alertness: Awake/alert Behavior During Therapy: WFL for tasks assessed/performed Overall Cognitive Status: History of cognitive impairments - at baseline  General Comments      Exercises     Assessment/Plan    PT Assessment Patient needs continued PT services  PT Problem List Decreased strength;Decreased activity tolerance;Decreased balance;Decreased mobility;Decreased knowledge of use of DME       PT Treatment Interventions DME instruction;Gait training;Functional mobility training;Therapeutic activities;Stair training;Therapeutic exercise;Balance training;Patient/family education    PT Goals (Current goals can be found in the Care  Plan section)  Acute Rehab PT Goals PT Goal Formulation: With patient/family Time For Goal Achievement: 07/25/16 Potential to Achieve Goals: Good    Frequency Min 3X/week   Barriers to discharge        Co-evaluation               AM-PAC PT "6 Clicks" Daily Activity  Outcome Measure Difficulty turning over in bed (including adjusting bedclothes, sheets and blankets)?: None Difficulty moving from lying on back to sitting on the side of the bed? : None Difficulty sitting down on and standing up from a chair with arms (e.g., wheelchair, bedside commode, etc,.)?: None Help needed moving to and from a bed to chair (including a wheelchair)?: A Little Help needed walking in hospital room?: A Little Help needed climbing 3-5 steps with a railing? : A Little 6 Click Score: 21    End of Session Equipment Utilized During Treatment: Gait belt Activity Tolerance: Patient tolerated treatment well Patient left: in chair;with call bell/phone within reach;with chair alarm set;with family/visitor present Nurse Communication: Mobility status PT Visit Diagnosis: Unsteadiness on feet (R26.81);Muscle weakness (generalized) (M62.81)    Time: 2330-0762 PT Time Calculation (min) (ACUTE ONLY): 16 min   Charges:   PT Evaluation $PT Eval Moderate Complexity: 1 Procedure     PT G CodesMarland Kitchen        [facility] PT [phone]   Angelina Ok [facility] 07/18/2016, 5:01 PM

## 2016-07-18 NOTE — Progress Notes (Signed)
  Echocardiogram 2D Echocardiogram has been performed.  Jasai Sorg L Androw 07/18/2016, 11:44 AM

## 2016-07-18 NOTE — Evaluation (Signed)
Speech Language Pathology Evaluation Patient Details Name: [name] MRN: [id number] DOB: 04-09-1926 Today's Date: 07/18/2016 Time: 4782-9562 SLP Time Calculation (min) (ACUTE ONLY): 27 min  Problem List:  Patient Active Problem List   Diagnosis Date Noted  . CVA (cerebral vascular accident) (HCC) 07/18/2016  . Stroke-like symptoms 07/18/2016  . Stroke (HCC) 07/18/2016  . Anemia 07/18/2016  . Non-English speaking patient 07/18/2016  . COPD, group B, by GOLD 2017 classification (HCC)   . Hypoxemia   . Atelectasis   . HAP (hospital-acquired pneumonia)   . Acute on chronic respiratory failure (HCC)   . Severe sepsis (HCC) 10/03/2015  . HCAP (healthcare-associated pneumonia) 10/03/2015  . Elevated troponin 10/03/2015  . Nausea, vomiting and diarrhea 10/03/2015  . Abdominal pain 10/03/2015  . Pneumonia 07/27/2015  . CKD (chronic kidney disease), stage III 07/27/2015  . Carotid artery disease (HCC) 07/27/2015  . CAP (community acquired pneumonia) 07/27/2015  . Weakness generalized 06/18/2014  . Hyponatremia 06/18/2014  . Dizziness 06/18/2014  . Dizzy 06/18/2014  . Pain in the chest   . Paroxysmal atrial fibrillation (HCC) 11/16/2013  . Diarrhea 11/16/2013  . Vomiting 11/16/2013  . Urticaria 11/16/2013  . Ischemic cardiomyopathy 11/16/2013  . Headache 11/16/2013  . Chronic anticoagulation 11/16/2013  . Neck pain on left side 11/16/2013  . Acute chest pain 11/16/2013  . Chest pain at rest 11/16/2013  . Coronary artery disease 10/14/2013  . Chronic systolic heart failure (HCC) 10/14/2013  . AAA (abdominal aortic aneurysm) without rupture (HCC) 09/08/2013  . Hyperlipidemia 06/08/2013  . History of CVA (cerebrovascular accident) 05/02/2013  . Dyspnea 04/29/2013  . Acute CHF- presume secondary to AF and diastolic dysfunction. (Nl LVF 2010) 04/29/2013  . Acute renal failure superimposed on stage 3 chronic kidney disease (HCC) 04/29/2013  . PVD (peripheral vascular  disease) 3.5cm AAA Aug 2014 04/29/2013  . NSTEMI - ? type 2 - Troponin 0.63 04/28/2013  . Chronic bilateral lower abdominal pain 02/17/2013  . Hypertension    Past Medical History:  Past Medical History:  Diagnosis Date  . AAA (abdominal aortic aneurysm) (HCC)   . Anemia   . Asthma   . Carotid artery disease (HCC)   . CHF (congestive heart failure) (HCC)   . Coronary artery disease   . Depression   . Gout   . H. pylori infection   . Hard of hearing   . Hiatal hernia   . Hyperplasia, prostate   . Hypertension   . PUD (peptic ulcer disease)   . Renal failure, acute (HCC) 11/08/2012   Past Surgical History:  Past Surgical History:  Procedure Laterality Date  . CAROTID STENT INSERTION  2015  . CAROTID STENT INSERTION N/A 05/09/2013   Procedure: CAROTID STENT INSERTION;  Surgeon: Runell Gess, MD;  Location: [facility] CATH LAB;  Service: Cardiovascular;  Laterality: N/A;  . ESOPHAGOGASTRODUODENOSCOPY  06/13/2008,12/31/12  . LEFT HEART CATHETERIZATION WITH CORONARY ANGIOGRAM N/A 05/04/2013   Procedure: LEFT HEART CATHETERIZATION WITH CORONARY ANGIOGRAM;  Surgeon: Peter M Swaziland, MD;  Location: [facility] CATH LAB;  Service: Cardiovascular;  Laterality: N/A;   HPI:  83 y.o. male with medical history significant for A. fib on Xarelto, CVA, CHF, chronic kidney disease stage III, COPD, anemia, CAD, hypertension, hyponatremia, presents to the emergency Department chief complaint left-sided weakness. Code stroke was activated he was evaluated by neurology who recommended admission for workup.  MRI revealed on 07/18/16  Small region of acute infarction affecting the gyral surface of the precentral gyrus on the right;  family stated he has a history of CVA's with right weakness; new c/o left weakness, but family feels that symptoms are resolving as he is no longer slurring his speech and left arm is more mobile.  Assessment / Plan / Recommendation Clinical Impression   Pt able to follow simple 1-2 step  directives, oriented x4 and speech was previously slurred/aphasic upon admission, but family states he has resolved this issue during communicative attempts and they are able to understand him in his native language.  He had baseline deficits with verbal communication/auditory comprehension/cognition from previous CVA's, and family feels he is at his baseline again with slight left arm weakness. During the OME, he was noted to have slight left labial weakness/assymmetry, but this does not affect his speaking ability at this time and/or his swallowing function.  ST will f/u for any cognitive concerns/changes that may need to be addressed in therapy, but all symptoms pt had initially upon admission re: speech/language appear to have resolved. Recommendations for D/C TBD as pt progresses.    SLP Assessment  SLP Visit Diagnosis: Cognitive communication deficit (R41.841)    Follow Up Recommendations  Other (comment) (TBD)    Frequency and Duration Other (Comment) (TBD per pt progress; resolving)         SLP Evaluation Cognition  Overall Cognitive Status: History of cognitive impairments - at baseline Arousal/Alertness: Awake/alert Orientation Level: Oriented X4       Comprehension  Auditory Comprehension Overall Auditory Comprehension: Impaired at baseline Conversation: Simple Interfering Components: Other (comment) (previous deficits; language barrier) Visual Recognition/Discrimination Discrimination: Not tested Reading Comprehension Reading Status: Not tested    Expression Expression Primary Mode of Expression: Verbal Verbal Expression Overall Verbal Expression: Impaired at baseline Level of Generative/Spontaneous Verbalization: Sentence Repetition: No impairment Naming: Not tested Non-Verbal Means of Communication: Not applicable Written Expression Written Expression: Not tested   Oral / Motor  Oral Motor/Sensory Function Overall Oral Motor/Sensory Function: Mild  impairment Facial ROM: Reduced left;Other (Comment) (slight) Facial Symmetry: Abnormal symmetry left;Other (Comment) (slight) Facial Strength: Within Functional Limits Facial Sensation: Within Functional Limits Lingual ROM: Within Functional Limits Lingual Symmetry: Within Functional Limits Lingual Strength: Within Functional Limits Lingual Sensation: Within Functional Limits Velum: Within Functional Limits Mandible: Within Functional Limits Motor Speech Overall Motor Speech: Appears within functional limits for tasks assessed Respiration: Within functional limits Phonation: Normal Resonance: Within functional limits Articulation: Within functional limitis Intelligibility: Intelligible Motor Planning: Witnin functional limits Interfering Components: Premorbid status             Functional Assessment Tool Used: NOMS; clinical judgment Functional Limitations: Other Speech Language Pathology Swallow Current Status [id number]): At least 1 percent but less than 20 percent impaired, limited or restricted Swallow Goal Status [phone]): At least 1 percent but less than 20 percent impaired, limited or restricted Other Speech-Language Pathology Functional Limitation Current Status [phone]): At least 20 percent but less than 40 percent impaired, limited or restricted Other Speech-Language Pathology Functional Limitation Goal Status [phone]): At least 1 percent but less than 20 percent impaired, limited or restricted         Tressie Stalker, M.S., CCC-SLP 07/18/2016, 12:13 PM

## 2016-07-18 NOTE — ED Provider Notes (Signed)
[facility]-EMERGENCY DEPT Provider Note   CSN: [id number] Arrival date & time: 07/18/16  0258  By signing my name below, I, [name], attest that this documentation has been prepared under the direction and in the presence of Jonathon Lyons, MD. Electronically Signed: Cynda Robinson, Scribe. 07/18/16. 3:41 AM.  History   Chief Complaint Chief Complaint  Patient presents with  . Weakness   LEVEL 5 CAVEAT DUE TO MENTAL STATUS   HPI Comments: [name] is a 83 y.o. male with a history of CAD, CHF, CKD, PAF, NSTEMI, AAA, and sepsis, who presents to the Emergency Department with family members, who report sudden-onset weakness that began earlier tonight. According to daughter bedside the patient was completely functional at baseline at 10 pm yesterday night (LKN). Daughter states she was awoke by her mother, who told her the patient woke up "limping" and unable to create any verbal response. Patient has been unresponsive since episode. Patient interacts and moves at baseline. Daughter denies any fall, trauma, or injury. Patient has had strokes in the past, he is unable to lift his right arm.   The history is provided by a relative. No language interpreter was used.    Past Medical History:  Diagnosis Date  . AAA (abdominal aortic aneurysm) (HCC)   . Anemia   . Asthma   . Carotid artery disease (HCC)   . CHF (congestive heart failure) (HCC)   . Coronary artery disease   . Depression   . Gout   . H. pylori infection   . Hard of hearing   . Hiatal hernia   . Hyperplasia, prostate   . Hypertension   . PUD (peptic ulcer disease)   . Renal failure, acute (HCC) 11/08/2012    Patient Active Problem List   Diagnosis Date Noted  . COPD, group B, by GOLD 2017 classification (HCC)   . Hypoxemia   . Atelectasis   . HAP (hospital-acquired pneumonia)   . Acute on chronic respiratory failure (HCC)   . Severe sepsis (HCC) 10/03/2015  . HCAP (healthcare-associated pneumonia) 10/03/2015    . Elevated troponin 10/03/2015  . Nausea, vomiting and diarrhea 10/03/2015  . Abdominal pain 10/03/2015  . Pneumonia 07/27/2015  . CKD (chronic kidney disease), stage III 07/27/2015  . Carotid artery disease (HCC) 07/27/2015  . CAP (community acquired pneumonia) 07/27/2015  . Weakness generalized 06/18/2014  . Hyponatremia 06/18/2014  . Dizziness 06/18/2014  . Dizzy 06/18/2014  . Pain in the chest   . Paroxysmal atrial fibrillation (HCC) 11/16/2013  . Diarrhea 11/16/2013  . Vomiting 11/16/2013  . Urticaria 11/16/2013  . Ischemic cardiomyopathy 11/16/2013  . Headache 11/16/2013  . Chronic anticoagulation 11/16/2013  . Neck pain on left side 11/16/2013  . Acute chest pain 11/16/2013  . Chest pain at rest 11/16/2013  . Coronary artery disease 10/14/2013  . Chronic systolic heart failure (HCC) 10/14/2013  . AAA (abdominal aortic aneurysm) without rupture (HCC) 09/08/2013  . Hyperlipidemia 06/08/2013  . History of CVA (cerebrovascular accident) 05/02/2013  . Dyspnea 04/29/2013  . Acute CHF- presume secondary to AF and diastolic dysfunction. (Nl LVF 2010) 04/29/2013  . Acute renal failure superimposed on stage 3 chronic kidney disease (HCC) 04/29/2013  . PVD (peripheral vascular disease) 3.5cm AAA Aug 2014 04/29/2013  . NSTEMI - ? type 2 - Troponin 0.63 04/28/2013  . Chronic bilateral lower abdominal pain 02/17/2013  . Hypertension     Past Surgical History:  Procedure Laterality Date  . CAROTID STENT INSERTION  2015  .  CAROTID STENT INSERTION N/A 05/09/2013   Procedure: CAROTID STENT INSERTION;  Surgeon: Runell Gess, MD;  Location: [facility] CATH LAB;  Service: Cardiovascular;  Laterality: N/A;  . ESOPHAGOGASTRODUODENOSCOPY  06/13/2008,12/31/12  . LEFT HEART CATHETERIZATION WITH CORONARY ANGIOGRAM N/A 05/04/2013   Procedure: LEFT HEART CATHETERIZATION WITH CORONARY ANGIOGRAM;  Surgeon: Peter M Swaziland, MD;  Location: [facility] CATH LAB;  Service: Cardiovascular;  Laterality: N/A;        Home Medications    Prior to Admission medications   Medication Sig Start Date End Date Taking? Authorizing Provider  allopurinol (ZYLOPRIM) 300 MG tablet Take 300 mg by mouth daily. To prevent gout    [provider]  arformoterol (BROVANA) 15 MCG/2ML NEBU Take 2 mLs (15 mcg total) by nebulization 2 (two) times daily. 10/10/15   Rhetta Mura, MD  aspirin EC 81 MG tablet Take 1 tablet (81 mg total) by mouth daily. 09/08/13   Marvel Plan, MD  atorvastatin (LIPITOR) 40 MG tablet Take 1 tablet (40 mg total) by mouth daily at 6 PM. 05/11/13   Allayne Butcher, PA-C  benzonatate (TESSALON) 100 MG capsule Take 1 capsule (100 mg total) by mouth every 8 (eight) hours. 10/28/15   Rolland Porter, MD  budesonide (PULMICORT) 0.5 MG/2ML nebulizer solution Take 2 mLs (0.5 mg total) by nebulization 2 (two) times daily. 10/10/15   Rhetta Mura, MD  carvedilol (COREG) 3.125 MG tablet Take 1 tablet (3.125 mg total) by mouth 2 (two) times daily with a meal. 06/19/14   Vassie Loll, MD  dextromethorphan-guaiFENesin [facility] DM) 30-600 MG 12hr tablet Take 1 tablet by mouth 2 (two) times daily. 10/10/15   Rhetta Mura, MD  esomeprazole (NEXIUM) 40 MG capsule Take 1 capsule (40 mg total) by mouth daily. 07/31/15   Margarita Grizzle, MD  ipratropium-albuterol (DUONEB) 0.5-2.5 (3) MG/3ML SOLN Take 3 mLs by nebulization 2 (two) times daily. May use 1 additional dose during the day if needed    [provider]  isosorbide mononitrate (IMDUR) 30 MG 24 hr tablet Take 30 mg by mouth once daily Patient taking differently: Take 30 mg by mouth daily. For control of heart pain 07/31/15   Margarita Grizzle, MD  NITROSTAT 0.4 MG SL tablet PLACE 1 TABLET UNDER THE TONGUE EVERY 5 MINUTES FOR 3 DOSES AS NEEDED FOR CHEST PAIN 10/29/15   Runell Gess, MD  NITROSTAT 0.4 MG SL tablet PLACE 1 TABLET UNDER THE TONGUE EVERY 5 MINUTES FOR 3 DOSES AS NEEDED FOR CHEST PAIN 03/25/16   Runell Gess, MD   nystatin (MYCOSTATIN) 100000 UNIT/ML suspension Take 5 mLs (500,000 Units total) by mouth 4 (four) times daily. 10/27/15   Rolland Porter, MD  Rivaroxaban (XARELTO) 15 MG TABS tablet Take 15 mg by mouth daily. To prevent strokes    [provider]  tamsulosin (FLOMAX) 0.4 MG CAPS capsule Take 0.4 mg by mouth daily. To improve bladder function    [provider]    Family History Family History  Problem Relation Age of Onset  . Colon cancer Neg Hx     Social History Social History  Substance Use Topics  . Smoking status: Former Smoker    Quit date: 02/17/2002  . Smokeless tobacco: Never Used     Comment: Quit seven years ago   . Alcohol use No     Allergies   Levofloxacin; Eliquis [apixaban]; Phenazopyridine hcl; Septra [sulfamethoxazole-trimethoprim]; Latex; and Tape   Review of Systems Review of Systems  Unable to perform ROS: Mental  status change     Physical Exam Updated Vital Signs BP (!) 170/95 (BP Location: Right Arm)   Pulse 77   Temp 97.6 F (36.4 C) (Oral)   Resp 20   SpO2 100%   Physical Exam  Constitutional: He is oriented to person, place, and time. He appears well-developed and well-nourished.  HENT:  Head: Normocephalic and atraumatic.  Mouth/Throat: Oropharynx is clear and moist.  Eyes: Pupils are equal, round, and reactive to light.  Neck: Normal range of motion. Neck supple.  Cardiovascular: Normal rate and regular rhythm.   No murmur heard. Pulmonary/Chest: Effort normal and breath sounds normal. No respiratory distress. He has no wheezes. He has no rales.  Abdominal: Soft. Bowel sounds are normal. He exhibits no distension. There is no tenderness.  Musculoskeletal: Normal range of motion.  Neurological: He is alert and oriented to person, place, and time.  Neurologic exam is somewhat limited secondary to language barrier. He does appear to have some weakness of all 4 extremities, however most pronounced in the left arm. He appears  to be having some dysarthria as well as.  Skin: Skin is warm and dry.  Psychiatric: He has a normal mood and affect.  Nursing note and vitals reviewed.   ED Treatments / Results  DIAGNOSTIC STUDIES: Oxygen Saturation is 100% on RA, normal by my interpretation.    COORDINATION OF CARE: 3:41 AM Discussed treatment plan with daughter at bedside and daughter agreed to plan, which includes a stroke workup.   Labs (all labs ordered are listed, but only abnormal results are displayed) Labs Reviewed - No data to display  EKG  EKG Interpretation None       Radiology No results found.  Procedures Procedures (including critical care time)  Medications Ordered in ED Medications - No data to display   Initial Impression / Assessment and Plan / ED Course  I have reviewed the triage vital signs and the nursing notes.  Pertinent labs & imaging results that were available during my care of the patient were reviewed by me and considered in my medical decision making (see chart for details).  Patient arrives here approximately 5 hours after last being seen normal with dysarthria and weakness of the left arm. The patient was immediately evaluated by me, then made a code stroke due to the nature and timing of his symptoms.  He was then sent for a stat head CT and was evaluated by Dr. Amada Jupiter from neurology. He has ordered additional studies including a perfusion scan and CT angiogram of the head and neck. This shows no obvious stroke, but does show a potential narrowing in the right MCA. He will undergo an MRI for further evaluation and will be admitted to the hospitalist service under the care of Dr. Katrinka Blazing.  Final Clinical Impressions(s) / ED Diagnoses   Final diagnoses:  None    New Prescriptions New Prescriptions   No medications on file   I personally performed the services described in this documentation, which was scribed in my presence. The recorded information has been  reviewed and is accurate.        Jonathon Lyons, MD 07/18/16 [phone]

## 2016-07-18 NOTE — ED Triage Notes (Signed)
Pt BIB family. Family states pt unable to speak now. Noticed at 0200. LKW 2200 this evening. According to family, pt able to walk to bed, eat for self, etc. Family now reporting pt is limp, weak.

## 2016-07-18 NOTE — ED Notes (Addendum)
To mri 

## 2016-07-18 NOTE — Consult Note (Signed)
Neurology Consultation Reason for Consult: APhasia Referring Physician: Judd Lien, D  CC: Aphasia  History is obtained from:family, translation with patient is done through family.  HPI: [name] is a 83 y.o. male with multiple medical problems including atrial fibrillation on Xarelto who presents with left-sided weakness and decreased responsiveness. He went to bed normal at 10 PM, as wife reports that when he woke up he was unable to lift "either arm." His brought into the emergency room where he was activated as code stroke. On my exam, he was able to hold his right arm up without difficulty but his left arm had significant weakness. He was taken for a stat CT perfusion which did not show any ischemic penumbra, and CT angiogram which shows right M1 stenosis without occlusion.  He will answer a few questions, but does follow commands.   LKW: 10 PM tpa given?: no, out of window   ROS: Unable to obtain due to altered mental status.   Past Medical History:  Diagnosis Date  . AAA (abdominal aortic aneurysm) (HCC)   . Anemia   . Asthma   . Carotid artery disease (HCC)   . CHF (congestive heart failure) (HCC)   . Coronary artery disease   . Depression   . Gout   . H. pylori infection   . Hard of hearing   . Hiatal hernia   . Hyperplasia, prostate   . Hypertension   . PUD (peptic ulcer disease)   . Renal failure, acute (HCC) 11/08/2012     Family History  Problem Relation Age of Onset  . Colon cancer Neg Hx      Social History:  reports that he quit smoking about 14 years ago. He has never used smokeless tobacco. He reports that he does not drink alcohol or use drugs.   Exam: Current vital signs: BP (!) 170/95 (BP Location: Right Arm)   Pulse 77   Temp 97.6 F (36.4 C) (Oral)   Resp 20   SpO2 100%  Vital signs in last 24 hours: Temp:  [97.6 F (36.4 C)] 97.6 F (36.4 C) (06/08 0324) Pulse Rate:  [77] 77 (06/08 0324) Resp:  [20] 20 (06/08 0324) BP:  (170)/(95) 170/95 (06/08 0324) SpO2:  [100 %] 100 % (06/08 0324)   Physical Exam  Constitutional: Appears well-developed and well-nourished.  Psych: Affect appropriate to situation Eyes: No scleral injection HENT: No OP obstrucion Head: Normocephalic.  Cardiovascular: Normal rate and regular rhythm.  Respiratory: Effort normal and breath sounds normal to anterior ascultation GI: Soft.  No distension. There is no tenderness.  Skin: WDI  Neuro: Mental Status: Patient is awake, alert, will not answer orientation questions He does not speak much, he does appear to attend the left Cranial Nerves: II: He does not blink to threat from the left but does from the right Pupils are equal, round, and reactive to light.   III,IV, VI: EOMI without ptosis or diploplia.  V: Facial sensation is symmetric to temperature VII: Facial movement is symmetric.  VIII: hearing is intact to voice X: Uvula elevates symmetrically XI: Shoulder shrug is symmetric. XII: tongue is midline without atrophy or fasciculations.  Motor: Tone is normal. Bulk is normal. He has left arm weakness, but is able to hold all 3 other extremities without drift. Sensory: Difficult to be certain due to leg which barrier, but he endorses sensation bilaterally Cerebellar: He will not perform, but there is no clear ataxia at least in the right arm.  I  have reviewed labs in epic and the results pertinent to this consultation are: Sodium 124 Creatinine 1.5  I have reviewed the images obtained: CT perfusion-negative  Impression: 83 year old male with decreased verbal output, left arm weakness. With his right MCA stenosis, I do think there is a good chance that this represents artery to artery embolus. The language barrier does make evaluation of little bit more difficult, but he does appear to have focal weakness and I wonder if he may be encephalopathic because of a right parieto-occipital stroke rather than aphasic. In any case,  he will need to be further evaluated. I would hold Xarelto pending MRI  Recommendations: 1. HgbA1c, fasting lipid panel 2. MRI of the brain without contrast 3. Frequent neuro checks 4. Echocardiogram 5. Prophylactic therapy-Antiplatelet med: Aspirin - dose 325mg  PO or 300mg  PR 6. Risk factor modification 7. Telemetry monitoring 8. PT consult, OT consult, Speech consult  Ritta Slot, MD [facility] [phone]  If 7pm- 7am, please page neurology on call as listed in AMION.

## 2016-07-18 NOTE — ED Notes (Signed)
Attempted report  Unsuccessful call back in 5 minutes

## 2016-07-18 NOTE — Evaluation (Signed)
Clinical/Bedside Swallow Evaluation Patient Details  Name: [name] MRN: [id number] Date of Birth: Aug 27, 1926  Today's Date: 07/18/2016 Time: SLP Start Time (ACUTE ONLY): 1039 SLP Stop Time (ACUTE ONLY): 1106 SLP Time Calculation (min) (ACUTE ONLY): 27 min  Past Medical History:  Past Medical History:  Diagnosis Date  . AAA (abdominal aortic aneurysm) (HCC)   . Anemia   . Asthma   . Carotid artery disease (HCC)   . CHF (congestive heart failure) (HCC)   . Coronary artery disease   . Depression   . Gout   . H. pylori infection   . Hard of hearing   . Hiatal hernia   . Hyperplasia, prostate   . Hypertension   . PUD (peptic ulcer disease)   . Renal failure, acute (HCC) 11/08/2012   Past Surgical History:  Past Surgical History:  Procedure Laterality Date  . CAROTID STENT INSERTION  2015  . CAROTID STENT INSERTION N/A 05/09/2013   Procedure: CAROTID STENT INSERTION;  Surgeon: Runell Gess, MD;  Location: [facility] CATH LAB;  Service: Cardiovascular;  Laterality: N/A;  . ESOPHAGOGASTRODUODENOSCOPY  06/13/2008,12/31/12  . LEFT HEART CATHETERIZATION WITH CORONARY ANGIOGRAM N/A 05/04/2013   Procedure: LEFT HEART CATHETERIZATION WITH CORONARY ANGIOGRAM;  Surgeon: Peter M Sw[place]and, MD;  Location: [facility] CATH LAB;  Service: Cardiovascular;  Laterality: N/A;   HPI:  83 y.o. male with medical history significant for A. fib on Xarelto, CVA, CHF, chronic kidney disease stage III, COPD, anemia, CAD, hypertension, hyponatremia, presents to the emergency Department chief complaint left-sided weakness. Code stroke was activated he was evaluated by neurology who recommended admission for workup; MRI revealed on 07/18/16  Small region of acute infarction affecting the gyral surface of the precentral gyrus on the right; family stated he has a history of CVA's with right weakness; new c/o left weakness, but family feels that symptoms are resolving as he is no longer slurring his speech and left arm is more  mobile.   Assessment / Plan / Recommendation Clinical Impression   Pt with mild oral dysphagia characterized by impaired mastication and prolonged oral transit of solids.  Pt exhibited slight left labial weakness/assymmetry at rest, but this does not appear to affect his swallowing ability.  He did not exhibit any overt s/s of aspiration during the BSE, but family did state he eats "softer foods" at home and does not have a huge appetite overall.  Recommend ST f/u for diet tolerance and compensatory strategies for safety with consumption of po's; Dysphagia 2 (minced) with thin liquids utilizing slow rate, small bites/sips and limited environmental distractions. SLP Visit Diagnosis: Dysphagia, unspecified (R13.10)    Aspiration Risk  Mild aspiration risk    Diet Recommendation   Dysphagia 2 (minced)/thin liquids  Medication Administration: Whole meds with puree    Other  Recommendations Oral Care Recommendations: Oral care BID   Follow up Recommendations Other (comment) (TBD)      Frequency and Duration min 2x/week  1 week       Prognosis Prognosis for Safe Diet Advancement: Good      Swallow Study   General Date of Onset: 07/18/16 HPI: 83 y.o. male with medical history significant for A. fib on Xarelto, CVA, CHF, chronic kidney disease stage III, COPD, anemia, CAD, hypertension, hyponatremia, presents to the emergency Department chief complaint left-sided weakness. Code stroke was activated he was evaluated by neurology who recommended admission for workup. Type of Study: Bedside Swallow Evaluation Previous Swallow Assessment:  (Failed NSSS) Diet Prior to this  Study: NPO Temperature Spikes Noted: No Respiratory Status: Room air History of Recent Intubation: No Behavior/Cognition: Alert;Cooperative Oral Cavity Assessment: Within Functional Limits Oral Care Completed by SLP: Recent completion by staff Oral Cavity - Dentition: Edentulous;Other (Comment) (Dentures not  available) Vision: Functional for self-feeding Self-Feeding Abilities: Able to feed self;Needs assist;Needs set up Patient Positioning: Upright in bed Baseline Vocal Quality: Low vocal intensity Volitional Cough: Weak Volitional Swallow: Able to elicit    Oral/Motor/Sensory Function Overall Oral Motor/Sensory Function: Mild impairment Facial ROM: Reduced left;Other (Comment) (slight) Facial Symmetry: Abnormal symmetry left;Other (Comment) (slight) Facial Strength: Within Functional Limits Facial Sensation: Within Functional Limits Lingual ROM: Within Functional Limits Lingual Symmetry: Within Functional Limits Lingual Strength: Within Functional Limits Lingual Sensation: Within Functional Limits Velum: Within Functional Limits Mandible: Within Functional Limits   Ice Chips Ice chips: Within functional limits Presentation: Spoon   Thin Liquid Thin Liquid: Within functional limits Presentation: Cup;Straw Other Comments:  (small sips only)    Nectar Thick Nectar Thick Liquid: Not tested   Honey Thick Honey Thick Liquid: Not tested   Puree Puree: Within functional limits Presentation: Spoon   Solid      Solid: Impaired Presentation: Spoon Oral Phase Impairments: Impaired mastication Oral Phase Functional Implications: Prolonged oral transit;Impaired mastication    Functional Assessment Tool Used: NOMS; clinical judgment Functional Limitations: Swallowing Swallow Current Status (Z6109): At least 1 percent but less than 20 percent impaired, limited or restricted Swallow Goal Status [phone]): At least 1 percent but less than 20 percent impaired, limited or restricted   Tressie Stalker, M.S., CCC-SLP 07/18/2016,11:58 AM

## 2016-07-18 NOTE — ED Notes (Signed)
ED Provider at bedside. 

## 2016-07-18 NOTE — ED Notes (Signed)
The pt did not pass swallow screen he does follow instructions

## 2016-07-18 NOTE — Progress Notes (Signed)
83 year old from [place] daughter translates. Past medical history significant for CVA without residual deficit, CAD, A. fib on anticoagulation of Xarelto; who presents with aphasia and left arm weakness. VS relatively stable. Lab work appears near patient's baseline. Neurology consulted and CT studies show a right MCA narrowing. Neurology recommended completion of stroke workup.

## 2016-07-18 NOTE — H&P (Signed)
History and Physical    [name] WUJ:[id number] DOB: [birth year] DOA: 07/18/2016  PCP: [name], MD Patient coming from: home  Chief Complaint: left sided weakness  HPI: [name] is a 83 y.o. male with medical history significant for A. fib on Xarelto, CVA, CHF, chronic kidney disease stage III, COPD, anemia, CAD, hypertension, hyponatremia, presents to the emergency Department chief complaint left-sided weakness. Code stroke was activated he was evaluated by neurology who recommended admission for workup.  Information is obtained from the chart and the wife was at the bedside noting moderate language barrier. Patient was last seen normal at 10:00 last night when he awakened this morning wife reports he could not move his arms. In addition he was unable to speak and had an unsteady gait.He then became unresponsive. Family denies any recent illness fall trauma. No reports of difficulty chewing or swallowing complaints of numbness or tingling of his extremities. They reported he's had strokes in the past and has been left with right arm weakness as result. No complaints of chest pain palpitation shortness of breath. No abdominal pain nausea vomiting diarrhea.    ED Course: Code stroke was called he was sent for head CT and evaluated by neurology. Dr. Amada Jupiter from neurology ordered perfusion scan and CT NGO of the head and neck which revealed no obvious stroke but showed a potential narrowing of the right MCA. In the emergency department blood pressures a high end of normal, he is afebrile and not hypoxic.  Review of Systems: As per HPI otherwise all other systems reviewed and are negative.   Ambulatory Status:uses a cane on occasion. No recent falls  Past Medical History:  Diagnosis Date  . AAA (abdominal aortic aneurysm) (HCC)   . Anemia   . Asthma   . Carotid artery disease (HCC)   . CHF (congestive heart failure) (HCC)   . Coronary artery disease   . Depression    . Gout   . H. pylori infection   . Hard of hearing   . Hiatal hernia   . Hyperplasia, prostate   . Hypertension   . PUD (peptic ulcer disease)   . Renal failure, acute (HCC) 11/08/2012    Past Surgical History:  Procedure Laterality Date  . CAROTID STENT INSERTION  2015  . CAROTID STENT INSERTION N/A 05/09/2013   Procedure: CAROTID STENT INSERTION;  Surgeon: Runell Gess, MD;  Location: [facility] CATH LAB;  Service: Cardiovascular;  Laterality: N/A;  . ESOPHAGOGASTRODUODENOSCOPY  06/13/2008,12/31/12  . LEFT HEART CATHETERIZATION WITH CORONARY ANGIOGRAM N/A 05/04/2013   Procedure: LEFT HEART CATHETERIZATION WITH CORONARY ANGIOGRAM;  Surgeon: Peter M Swaziland, MD;  Location: [facility] CATH LAB;  Service: Cardiovascular;  Laterality: N/A;    Social History   Social History  . Marital status: Married    Spouse name: san  . Number of children: 13  . Years of education: 4th grade   Occupational History  . retired    Social History Main Topics  . Smoking status: Former Smoker    Quit date: 02/17/2002  . Smokeless tobacco: Never Used     Comment: Quit seven years ago   . Alcohol use No  . Drug use: No  . Sexual activity: No   Other Topics Concern  . Not on file   Social History Narrative   Patient lives at home with his daughter   Patient is right handed    Patient drinks tea and coffee    Allergies  Allergen Reactions  .  Levofloxacin Anaphylaxis and Other (See Comments)    Patient told his daughter that it made him "feel worse than he already did" (overall) Headache also (has refused to take anymore)  . Eliquis [Apixaban] Itching and Swelling  . Phenazopyridine Hcl Other (See Comments)    Unknown reaction  . Septra [Sulfamethoxazole-Trimethoprim] Nausea And Vomiting    GI Upset  . Latex Rash  . Tape Rash    Family History  Problem Relation Age of Onset  . Colon cancer Neg Hx     Prior to Admission medications   Medication Sig Start Date End Date Taking? Authorizing  Provider  allopurinol (ZYLOPRIM) 300 MG tablet Take 300 mg by mouth daily. To prevent gout    [provider]  arformoterol (BROVANA) 15 MCG/2ML NEBU Take 2 mLs (15 mcg total) by nebulization 2 (two) times daily. 10/10/15   Rhetta Mura, MD  aspirin EC 81 MG tablet Take 1 tablet (81 mg total) by mouth daily. 09/08/13   Marvel Plan, MD  atorvastatin (LIPITOR) 40 MG tablet Take 1 tablet (40 mg total) by mouth daily at 6 PM. 05/11/13   Allayne Butcher, PA-C  benzonatate (TESSALON) 100 MG capsule Take 1 capsule (100 mg total) by mouth every 8 (eight) hours. 10/28/15   Rolland Porter, MD  budesonide (PULMICORT) 0.5 MG/2ML nebulizer solution Take 2 mLs (0.5 mg total) by nebulization 2 (two) times daily. 10/10/15   Rhetta Mura, MD  carvedilol (COREG) 3.125 MG tablet Take 1 tablet (3.125 mg total) by mouth 2 (two) times daily with a meal. 06/19/14   Vassie Loll, MD  dextromethorphan-guaiFENesin [facility] DM) 30-600 MG 12hr tablet Take 1 tablet by mouth 2 (two) times daily. 10/10/15   Rhetta Mura, MD  esomeprazole (NEXIUM) 40 MG capsule Take 1 capsule (40 mg total) by mouth daily. 07/31/15   Margarita Grizzle, MD  ipratropium-albuterol (DUONEB) 0.5-2.5 (3) MG/3ML SOLN Take 3 mLs by nebulization 2 (two) times daily. May use 1 additional dose during the day if needed    [provider]  isosorbide mononitrate (IMDUR) 30 MG 24 hr tablet Take 30 mg by mouth once daily Patient taking differently: Take 30 mg by mouth daily. For control of heart pain 07/31/15   Margarita Grizzle, MD  NITROSTAT 0.4 MG SL tablet PLACE 1 TABLET UNDER THE TONGUE EVERY 5 MINUTES FOR 3 DOSES AS NEEDED FOR CHEST PAIN 10/29/15   Runell Gess, MD  NITROSTAT 0.4 MG SL tablet PLACE 1 TABLET UNDER THE TONGUE EVERY 5 MINUTES FOR 3 DOSES AS NEEDED FOR CHEST PAIN 03/25/16   Runell Gess, MD  nystatin (MYCOSTATIN) 100000 UNIT/ML suspension Take 5 mLs (500,000 Units total) by mouth 4 (four) times daily. 10/27/15    Rolland Porter, MD  Rivaroxaban (XARELTO) 15 MG TABS tablet Take 15 mg by mouth daily. To prevent strokes    [provider]  tamsulosin (FLOMAX) 0.4 MG CAPS capsule Take 0.4 mg by mouth daily. To improve bladder function    [provider]    Physical Exam: Vitals:   07/18/16 0500 07/18/16 0515 07/18/16 0530 07/18/16 0545  BP: (!) 163/91 (!) 175/92 (!) 159/84 (!) 166/86  Pulse: 76 77 83 76  Resp: (!) 21 (!) 23 (!) 23 18  Temp:      TempSrc:      SpO2: 97% 98% 100% 98%     General:  Appears calm and comfortable, alert Eyes:  PERRL, EOMI, normal lids, iris ENT:  grossly normal hearing, lips &  tongue, mucous membranes of his mouth are pink and dry Neck:  no LAD, masses or thyromegaly Cardiovascular:  Irregularly irregular, no m/r/g. No LE edema.  Respiratory:  CTA bilaterally, no w/r/r. Normal respiratory effort. Abdomen:  soft, ntnd, positive bowel sounds Skin:  no rash or induration seen on limited exam Musculoskeletal:  grossly normal tone BUE/BLE, good ROM, no bony abnormality Psychiatric:  grossly normal mood and affect, speech fluent and appropriate, AOx3 Neurologic:  Alert oriented to self. Attempts to follow commands. Spoke is name slowly through interpreter left grip 3 out of 5 right grip 4 out of 5 moving lower extremities spontaneously facial symmetry  Labs on Admission: I have personally reviewed following labs and imaging studies  CBC:  Recent Labs Lab 07/18/16 0344 07/18/16 0351  WBC 7.3  --   NEUTROABS 5.3  --   HGB 10.2* 10.9*  HCT 30.6* 32.0*  MCV 90.8  --   PLT 144*  --    Basic Metabolic Panel:  Recent Labs Lab 07/18/16 0344 07/18/16 0351  NA 124* 128*  K 3.9 4.0  CL 93* 92*  CO2 24  --   GLUCOSE 111* 109*  BUN 15 17  CREATININE 1.50* 1.60*  CALCIUM 8.5*  --    GFR: CrCl cannot be calculated (Unknown ideal weight.). Liver Function Tests:  Recent Labs Lab 07/18/16 0344  AST 25  ALT 14*  ALKPHOS 83  BILITOT 1.3*  PROT  7.1  ALBUMIN 3.3*   No results for input(s): LIPASE, AMYLASE in the last 168 hours. No results for input(s): AMMONIA in the last 168 hours. Coagulation Profile:  Recent Labs Lab 07/18/16 0344  INR 1.19   Cardiac Enzymes: No results for input(s): CKTOTAL, CKMB, CKMBINDEX, TROPONINI in the last 168 hours. BNP (last 3 results) No results for input(s): PROBNP in the last 8760 hours. HbA1C: No results for input(s): HGBA1C in the last 72 hours. CBG:  Recent Labs Lab 07/18/16 0341  GLUCAP 115*   Lipid Profile: No results for input(s): CHOL, HDL, LDLCALC, TRIG, CHOLHDL, LDLDIRECT in the last 72 hours. Thyroid Function Tests: No results for input(s): TSH, T4TOTAL, FREET4, T3FREE, THYROIDAB in the last 72 hours. Anemia Panel: No results for input(s): VITAMINB12, FOLATE, FERRITIN, TIBC, IRON, RETICCTPCT in the last 72 hours. Urine analysis:    Component Value Date/Time   COLORURINE AMBER (A) 10/03/2015 2045   APPEARANCEUR CLEAR 10/03/2015 2045   LABSPEC 1.021 10/03/2015 2045   PHURINE 6.0 10/03/2015 2045   GLUCOSEU NEGATIVE 10/03/2015 2045   HGBUR SMALL (A) 10/03/2015 2045   BILIRUBINUR SMALL (A) 10/03/2015 2045   KETONESUR 15 (A) 10/03/2015 2045   PROTEINUR 100 (A) 10/03/2015 2045   UROBILINOGEN 0.2 07/07/2014 0604   NITRITE NEGATIVE 10/03/2015 2045   LEUKOCYTESUR NEGATIVE 10/03/2015 2045    Creatinine Clearance: CrCl cannot be calculated (Unknown ideal weight.).  Sepsis Labs: @LABRCNTIP (procalcitonin:4,lacticidven:4) )No results found for this or any previous visit (from the past 240 hour(s)).   Radiological Exams on Admission: Ct Angio Head W Or Wo Contrast  Result Date: 07/18/2016 CLINICAL DATA:  Initial evaluation for acute aphasia with right-sided weakness. EXAM: CT ANGIOGRAPHY HEAD AND NECK CT PERFUSION BRAIN TECHNIQUE: Multidetector CT imaging of the head and neck was performed using the standard protocol during bolus administration of intravenous contrast.  Multiplanar CT image reconstructions and MIPs were obtained to evaluate the vascular anatomy. Carotid stenosis measurements (when applicable) are obtained utilizing NASCET criteria, using the distal internal carotid diameter as the denominator. Multiphase CT imaging  of the brain was performed following IV bolus contrast injection. Subsequent parametric perfusion maps were calculated using RAPID software. CONTRAST:  90 cc of Isovue 370. COMPARISON:  Prior noncontrast head CT from earlier the same day. FINDINGS: CTA NECK FINDINGS Aortic arch: Visualized aortic arch of normal caliber with normal branch pattern. The head be calcified noncalcified plaque within the arch itself with few small focal penetrating atheromatous plaques (series 9, image 319). No high-grade stenosis about the origin of the great vessels. Visualized subclavian artery is patent without stenosis. Right carotid system: Right common carotid artery patent from its origin to the bifurcation without stenosis. Calcified noncalcified plaque about the right bifurcation/proximal right ICA without flow-limiting stenosis. Right ICA widely patent distally to the skullbase without stenosis, dissection, or occlusion. Left carotid system: Left common carotid artery patent from its origin to the bifurcation. Carotid artery's stent present at the left bifurcation extending into the proximal left ICA and. There is patent flow through the stent, although there is moderate narrowing at the distal aspect of the stent by approximately 70% (series 9, image 200). Left ICA widely patent distally to the skullbase without stenosis, dissection, or occlusion. Vertebral arteries: Both of the vertebral arteries arise from the subclavian arteries. Left vertebral artery dominant. Focal moderate narrowing of the left AV 2 segment at the level of C3 related to extrinsic compression from degenerative changes (series 9, image 202). Vertebral artery is otherwise patent within the neck  without stenosis, dissection, or occlusion. Skeleton: No acute osseus abnormality. No worrisome lytic or blastic osseous lesions. Moderate to advanced multilevel degenerative spondylolysis noted within cervical spine. Other neck: No acute soft tissue abnormality within the neck. No adenopathy. Thyroid normal. Upper chest: Visualized upper chest within normal limits. Partially visualized lungs are clear. Review of the MIP images confirms the above findings CTA HEAD FINDINGS Anterior circulation: The petrous segments patent bilaterally without stenosis. Multifocal atheromatous plaque within the cavernous/ supraclinoid ICAs with mild to moderate multifocal narrowing. No high-grade stenosis. ICA termini widely patent. Left A1 segment dominant and patent. Right A1 segment slightly diminutive but patent as well. Anterior cerebral artery is widely patent to their distal aspects. Left M1 segment mildly irregular but patent to its distal aspect without significant stenosis. No proximal left M2 occlusion. Distal left MCA branches widely patent. Left M1 segment widely patent at its origin. The right M1 smoothly taper is at its mid and distal aspect with up twos 75% stenosis at its distal aspect. No filling defect to suggest intraluminal thrombus or other significant intimal irregularity to suggest that this is acute in nature. Stenosis extends into the anterior right temporal branch. Posterior branches attenuated and small as well. No proximal M2 occlusion. Distal right MCA branches well opacified and symmetric with the left. Posterior circulation: Scattered plaque present within the dominant left V4 segment without flow-limiting stenosis. Hypoplastic right vertebral artery divides at the takeoff of the right PICA, with a small ascending branch ascending towards the vertebrobasilar junction. Right PICA patent. Left PICA not visualized. Basilar artery demonstrates mild multifocal atheromatous irregularity but is widely patent  to its distal aspect. Superior cerebral arteries patent bilaterally. Right PCA largely supplied via the basilar artery and is widely patent to its distal aspect. Small right posterior communicating artery noted as well. Predominant fetal type left PCA supplied via a widely patent left posterior communicating artery. Left PCA patent to its distal aspect. Venous sinuses: Patent. Anatomic variants: No significant anatomic variant. No aneurysm or vascular malformation. Delayed phase:  Not performed. Review of the MIP images confirms the above findings CT Brain Perfusion Findings: CBF (<30%) Volume: 0mL Perfusion (Tmax>6.0s) volume: 0mL Mismatch Volume: 0mL Infarction Location: IMPRESSION: 1. Negative CTA for emergent large vessel occlusion. 2. Negative CT perfusion. No evidence for acute infarct or perfusion mismatch. 3. Moderate to severe diffuse narrowing of the mid-distal right M1 segment to approximately 75%. This is presumably chronic in nature given the lack of perfusion mismatch. 4. Patent left carotid artery stent. Moderate narrowing at the distal aspect of the stent to approximately 70% 5. Prominent atheromatous disease involving the visualized aortic arch, with additional mild to moderate multifocal atherosclerotic changes as above. Results were called by telephone at the time of interpretation on 07/18/2016 at 4:38 am to Dr. Ritta Slot , who verbally acknowledged these results. Electronically Signed   By: Rise Mu M.D.   On: 07/18/2016 05:12   Ct Angio Neck W Or Wo Contrast  Result Date: 07/18/2016 CLINICAL DATA:  Initial evaluation for acute aphasia with right-sided weakness. EXAM: CT ANGIOGRAPHY HEAD AND NECK CT PERFUSION BRAIN TECHNIQUE: Multidetector CT imaging of the head and neck was performed using the standard protocol during bolus administration of intravenous contrast. Multiplanar CT image reconstructions and MIPs were obtained to evaluate the vascular anatomy. Carotid stenosis  measurements (when applicable) are obtained utilizing NASCET criteria, using the distal internal carotid diameter as the denominator. Multiphase CT imaging of the brain was performed following IV bolus contrast injection. Subsequent parametric perfusion maps were calculated using RAPID software. CONTRAST:  90 cc of Isovue 370. COMPARISON:  Prior noncontrast head CT from earlier the same day. FINDINGS: CTA NECK FINDINGS Aortic arch: Visualized aortic arch of normal caliber with normal branch pattern. The head be calcified noncalcified plaque within the arch itself with few small focal penetrating atheromatous plaques (series 9, image 319). No high-grade stenosis about the origin of the great vessels. Visualized subclavian artery is patent without stenosis. Right carotid system: Right common carotid artery patent from its origin to the bifurcation without stenosis. Calcified noncalcified plaque about the right bifurcation/proximal right ICA without flow-limiting stenosis. Right ICA widely patent distally to the skullbase without stenosis, dissection, or occlusion. Left carotid system: Left common carotid artery patent from its origin to the bifurcation. Carotid artery's stent present at the left bifurcation extending into the proximal left ICA and. There is patent flow through the stent, although there is moderate narrowing at the distal aspect of the stent by approximately 70% (series 9, image 200). Left ICA widely patent distally to the skullbase without stenosis, dissection, or occlusion. Vertebral arteries: Both of the vertebral arteries arise from the subclavian arteries. Left vertebral artery dominant. Focal moderate narrowing of the left AV 2 segment at the level of C3 related to extrinsic compression from degenerative changes (series 9, image 202). Vertebral artery is otherwise patent within the neck without stenosis, dissection, or occlusion. Skeleton: No acute osseus abnormality. No worrisome lytic or  blastic osseous lesions. Moderate to advanced multilevel degenerative spondylolysis noted within cervical spine. Other neck: No acute soft tissue abnormality within the neck. No adenopathy. Thyroid normal. Upper chest: Visualized upper chest within normal limits. Partially visualized lungs are clear. Review of the MIP images confirms the above findings CTA HEAD FINDINGS Anterior circulation: The petrous segments patent bilaterally without stenosis. Multifocal atheromatous plaque within the cavernous/ supraclinoid ICAs with mild to moderate multifocal narrowing. No high-grade stenosis. ICA termini widely patent. Left A1 segment dominant and patent. Right A1 segment slightly diminutive but  patent as well. Anterior cerebral artery is widely patent to their distal aspects. Left M1 segment mildly irregular but patent to its distal aspect without significant stenosis. No proximal left M2 occlusion. Distal left MCA branches widely patent. Left M1 segment widely patent at its origin. The right M1 smoothly taper is at its mid and distal aspect with up twos 75% stenosis at its distal aspect. No filling defect to suggest intraluminal thrombus or other significant intimal irregularity to suggest that this is acute in nature. Stenosis extends into the anterior right temporal branch. Posterior branches attenuated and small as well. No proximal M2 occlusion. Distal right MCA branches well opacified and symmetric with the left. Posterior circulation: Scattered plaque present within the dominant left V4 segment without flow-limiting stenosis. Hypoplastic right vertebral artery divides at the takeoff of the right PICA, with a small ascending branch ascending towards the vertebrobasilar junction. Right PICA patent. Left PICA not visualized. Basilar artery demonstrates mild multifocal atheromatous irregularity but is widely patent to its distal aspect. Superior cerebral arteries patent bilaterally. Right PCA largely supplied via the  basilar artery and is widely patent to its distal aspect. Small right posterior communicating artery noted as well. Predominant fetal type left PCA supplied via a widely patent left posterior communicating artery. Left PCA patent to its distal aspect. Venous sinuses: Patent. Anatomic variants: No significant anatomic variant. No aneurysm or vascular malformation. Delayed phase: Not performed. Review of the MIP images confirms the above findings CT Brain Perfusion Findings: CBF (<30%) Volume: 0mL Perfusion (Tmax>6.0s) volume: 0mL Mismatch Volume: 0mL Infarction Location: IMPRESSION: 1. Negative CTA for emergent large vessel occlusion. 2. Negative CT perfusion. No evidence for acute infarct or perfusion mismatch. 3. Moderate to severe diffuse narrowing of the mid-distal right M1 segment to approximately 75%. This is presumably chronic in nature given the lack of perfusion mismatch. 4. Patent left carotid artery stent. Moderate narrowing at the distal aspect of the stent to approximately 70% 5. Prominent atheromatous disease involving the visualized aortic arch, with additional mild to moderate multifocal atherosclerotic changes as above. Results were called by telephone at the time of interpretation on 07/18/2016 at 4:38 am to Dr. Ritta Slot , who verbally acknowledged these results. Electronically Signed   By: Rise Mu M.D.   On: 07/18/2016 05:12   Mr Brain Wo Contrast  Result Date: 07/18/2016 CLINICAL DATA:  Acute presentation with sudden onset weakness on the right and aphasia. EXAM: MRI HEAD WITHOUT CONTRAST TECHNIQUE: Multiplanar, multiecho pulse sequences of the brain and surrounding structures were obtained without intravenous contrast. COMPARISON:  CT study same day FINDINGS: Brain: Diffusion imaging shows acute infarction along the gyral surface of the precentral gyrus on the right. No evidence of swelling or hemorrhage. The brainstem is normal few old small vessel cerebellar  infarctions. Cerebral hemispheres show old hemorrhagic lacunar infarction affecting the right lateral thalamus/ posterior limb internal capsule, an old right posterior frontal cortical and subcortical infarction, small old left parietal cortical and subcortical infarctions, and chronic small-vessel ischemic changes of the cerebral hemispheric white matter. Some residual hemosiderin associated with a small left frontal infarction. No evidence of neoplastic mass lesion, hydrocephalus or extra-axial collection. Vascular: Major vessels at the base of the brain show flow. Skull and upper cervical spine: Negative Sinuses/Orbits: Clear/ normal.  Small mastoid effusion on the right Other: None significant IMPRESSION: Small region of acute infarction affecting the gyral surface of the precentral gyrus on the right. Extensive chronic small vessel ischemic changes as outlined above,  with old hemosiderin deposition in the right lateral thalamus/posterior limb internal capsule and left frontal cortical/subcortical brain. Small old cortical infarctions in the right posterior frontal, left posterior frontal, and left parietal regions. Electronically Signed   By: Paulina Fusi M.D.   On: 07/18/2016 07:03   Ct Cerebral Perfusion W Contrast  Result Date: 07/18/2016 CLINICAL DATA:  Initial evaluation for acute aphasia with right-sided weakness. EXAM: CT ANGIOGRAPHY HEAD AND NECK CT PERFUSION BRAIN TECHNIQUE: Multidetector CT imaging of the head and neck was performed using the standard protocol during bolus administration of intravenous contrast. Multiplanar CT image reconstructions and MIPs were obtained to evaluate the vascular anatomy. Carotid stenosis measurements (when applicable) are obtained utilizing NASCET criteria, using the distal internal carotid diameter as the denominator. Multiphase CT imaging of the brain was performed following IV bolus contrast injection. Subsequent parametric perfusion maps were calculated using  RAPID software. CONTRAST:  90 cc of Isovue 370. COMPARISON:  Prior noncontrast head CT from earlier the same day. FINDINGS: CTA NECK FINDINGS Aortic arch: Visualized aortic arch of normal caliber with normal branch pattern. The head be calcified noncalcified plaque within the arch itself with few small focal penetrating atheromatous plaques (series 9, image 319). No high-grade stenosis about the origin of the great vessels. Visualized subclavian artery is patent without stenosis. Right carotid system: Right common carotid artery patent from its origin to the bifurcation without stenosis. Calcified noncalcified plaque about the right bifurcation/proximal right ICA without flow-limiting stenosis. Right ICA widely patent distally to the skullbase without stenosis, dissection, or occlusion. Left carotid system: Left common carotid artery patent from its origin to the bifurcation. Carotid artery's stent present at the left bifurcation extending into the proximal left ICA and. There is patent flow through the stent, although there is moderate narrowing at the distal aspect of the stent by approximately 70% (series 9, image 200). Left ICA widely patent distally to the skullbase without stenosis, dissection, or occlusion. Vertebral arteries: Both of the vertebral arteries arise from the subclavian arteries. Left vertebral artery dominant. Focal moderate narrowing of the left AV 2 segment at the level of C3 related to extrinsic compression from degenerative changes (series 9, image 202). Vertebral artery is otherwise patent within the neck without stenosis, dissection, or occlusion. Skeleton: No acute osseus abnormality. No worrisome lytic or blastic osseous lesions. Moderate to advanced multilevel degenerative spondylolysis noted within cervical spine. Other neck: No acute soft tissue abnormality within the neck. No adenopathy. Thyroid normal. Upper chest: Visualized upper chest within normal limits. Partially visualized  lungs are clear. Review of the MIP images confirms the above findings CTA HEAD FINDINGS Anterior circulation: The petrous segments patent bilaterally without stenosis. Multifocal atheromatous plaque within the cavernous/ supraclinoid ICAs with mild to moderate multifocal narrowing. No high-grade stenosis. ICA termini widely patent. Left A1 segment dominant and patent. Right A1 segment slightly diminutive but patent as well. Anterior cerebral artery is widely patent to their distal aspects. Left M1 segment mildly irregular but patent to its distal aspect without significant stenosis. No proximal left M2 occlusion. Distal left MCA branches widely patent. Left M1 segment widely patent at its origin. The right M1 smoothly taper is at its mid and distal aspect with up twos 75% stenosis at its distal aspect. No filling defect to suggest intraluminal thrombus or other significant intimal irregularity to suggest that this is acute in nature. Stenosis extends into the anterior right temporal branch. Posterior branches attenuated and small as well. No proximal M2 occlusion. Distal  right MCA branches well opacified and symmetric with the left. Posterior circulation: Scattered plaque present within the dominant left V4 segment without flow-limiting stenosis. Hypoplastic right vertebral artery divides at the takeoff of the right PICA, with a small ascending branch ascending towards the vertebrobasilar junction. Right PICA patent. Left PICA not visualized. Basilar artery demonstrates mild multifocal atheromatous irregularity but is widely patent to its distal aspect. Superior cerebral arteries patent bilaterally. Right PCA largely supplied via the basilar artery and is widely patent to its distal aspect. Small right posterior communicating artery noted as well. Predominant fetal type left PCA supplied via a widely patent left posterior communicating artery. Left PCA patent to its distal aspect. Venous sinuses: Patent. Anatomic  variants: No significant anatomic variant. No aneurysm or vascular malformation. Delayed phase: Not performed. Review of the MIP images confirms the above findings CT Brain Perfusion Findings: CBF (<30%) Volume: 0mL Perfusion (Tmax>6.0s) volume: 0mL Mismatch Volume: 0mL Infarction Location: IMPRESSION: 1. Negative CTA for emergent large vessel occlusion. 2. Negative CT perfusion. No evidence for acute infarct or perfusion mismatch. 3. Moderate to severe diffuse narrowing of the mid-distal right M1 segment to approximately 75%. This is presumably chronic in nature given the lack of perfusion mismatch. 4. Patent left carotid artery stent. Moderate narrowing at the distal aspect of the stent to approximately 70% 5. Prominent atheromatous disease involving the visualized aortic arch, with additional mild to moderate multifocal atherosclerotic changes as above. Results were called by telephone at the time of interpretation on 07/18/2016 at 4:38 am to Dr. Ritta Slot , who verbally acknowledged these results. Electronically Signed   By: Rise Mu M.D.   On: 07/18/2016 05:12   Ct Head Code Stroke W/o Cm  Result Date: 07/18/2016 CLINICAL DATA:  Code stroke. Initial evaluation for acute aphasia, right-sided weakness peer EXAM: CT HEAD WITHOUT CONTRAST TECHNIQUE: Contiguous axial images were obtained from the base of the skull through the vertex without intravenous contrast. COMPARISON:  Prior CT from 06/17/2014. FINDINGS: Brain: Moderate cerebral atrophy with chronic small vessel ischemic disease. Remote anterior right MCA territory infarct. Additional small remote left ACA/MCA territory infarct noted at the vertex. No acute intracranial hemorrhage. No acute large vessel territory infarct. No mass lesion, midline shift or mass effect. No hydrocephalus. No extra-axial fluid collection. Vascular: No hyperdense vessel. Prominent intracranial atherosclerosis. Skull: Scalp soft tissues and calvarium within  normal limits. Sinuses/Orbits: Globes and oval soft tissues within normal limits. Paranasal sinuses are clear. Right mastoid effusion noted. ASPECTS [facility] Stroke Program Early CT Score) - Ganglionic level infarction (caudate, lentiform nuclei, internal capsule, insula, M1-M3 cortex): 7 - Supraganglionic infarction (M4-M6 cortex): 3 Total score (0-10 with 10 being normal): 10 IMPRESSION: 1. No acute intracranial infarct or other process identified. 2. ASPECTS is 10 3. Moderate cerebral atrophy with chronic microvascular ischemic disease and scattered remote infarcts as above. Critical Value/emergent results were called by telephone at the time of interpretation on 07/18/2016 at 4:18 am to Dr. Amada Jupiter , who verbally acknowledged these results. Electronically Signed   By: Rise Mu M.D.   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 Stroke. History of same. Risk factors include hypertension hyperlipidemia A. Fib. Perfusion scan and CT angiogram of the head and neck revealing no obvious stroke. Evaluated by neurology who opined with right MCA stenosis concern for artery to artery embolus. -Admit to telemetry -Follow MRI -Frequent neuro checks -Echocardiogram -PT and OT -Speech therapy as he failed the bedside swallow eval -Nothing by mouth for now -Aspirin -Lipid panel hemoglobin A1c -Holding Xarelto for now per neuro recommendation  #2. Hyponatremia. Sodium level 128. Chart review indicates he has a history of same. -Gentle IV  fluids -Urine sodium and urine osmolality -Serum osmolality -Repeat the morning  #3. Chronic kidney disease. Stage III. Dressing 1.6 on admission. This appears to be close to baseline -Gentle IV fluids -Monitor urine output  #4. Hypertension. Blood pressure high end of normal in the emergency department. Home medications include Coreg -Hold for now -Monitor -Hydralazine with parameters as needed  #5. A. Fib. [place] score 5. On Xarelto.  -Follow EKG -Holding Xarelto for now  #6. Chronic systolic heart failure. Last echo was August 2017. EF was 40% and severe hypokinesis. -Holding beta blocker for now -Daily weights -Intake and output -Echocardiogram as noted above  #7. COPD. Not on home oxygen. Appears stable at baseline -Continue home meds  #8. Anemia. Hemoglobin 10.9. Chart review indicates this is close to his baseline. No signs symptoms of active bleeding. Home medications to include Xarelto -FOBT -Anemia panel -Outpatient follow-up    DVT prophylaxis: scd  Code Status: full  Family Communication: son (interpreter) and wife  Disposition Plan: snf  Consults called: dr Amada Jupiter neurology  Admission status: inpatient    Gwenyth Bender MD [facility]  If 7PM-7AM, please contact night-coverage [email or web address] Password [email or web address]  07/18/2016, 7:21 AM

## 2016-07-19 ENCOUNTER — Encounter (HOSPITAL_COMMUNITY): Payer: Self-pay | Admitting: *Deleted

## 2016-07-19 DIAGNOSIS — I779 Disorder of arteries and arterioles, unspecified: Secondary | ICD-10-CM

## 2016-07-19 DIAGNOSIS — I10 Essential (primary) hypertension: Secondary | ICD-10-CM

## 2016-07-19 DIAGNOSIS — I714 Abdominal aortic aneurysm, without rupture: Secondary | ICD-10-CM

## 2016-07-19 DIAGNOSIS — E785 Hyperlipidemia, unspecified: Secondary | ICD-10-CM

## 2016-07-19 DIAGNOSIS — I5022 Chronic systolic (congestive) heart failure: Secondary | ICD-10-CM

## 2016-07-19 DIAGNOSIS — J449 Chronic obstructive pulmonary disease, unspecified: Secondary | ICD-10-CM

## 2016-07-19 DIAGNOSIS — I480 Paroxysmal atrial fibrillation: Secondary | ICD-10-CM

## 2016-07-19 DIAGNOSIS — E871 Hypo-osmolality and hyponatremia: Secondary | ICD-10-CM

## 2016-07-19 DIAGNOSIS — N183 Chronic kidney disease, stage 3 (moderate): Secondary | ICD-10-CM

## 2016-07-19 DIAGNOSIS — Z789 Other specified health status: Secondary | ICD-10-CM

## 2016-07-19 LAB — CBC
HCT: 33 % — ABNORMAL LOW (ref 39.0–52.0)
Hemoglobin: 11.2 g/dL — ABNORMAL LOW (ref 13.0–17.0)
MCH: 30.9 pg (ref 26.0–34.0)
MCHC: 33.9 g/dL (ref 30.0–36.0)
MCV: 91.2 fL (ref 78.0–100.0)
PLATELETS: 145 10*3/uL — AB (ref 150–400)
RBC: 3.62 MIL/uL — ABNORMAL LOW (ref 4.22–5.81)
RDW: 15.1 % (ref 11.5–15.5)
WBC: 7 10*3/uL (ref 4.0–10.5)

## 2016-07-19 LAB — BASIC METABOLIC PANEL
ANION GAP: 10 (ref 5–15)
BUN: 14 mg/dL (ref 6–20)
CALCIUM: 8.9 mg/dL (ref 8.9–10.3)
CO2: 22 mmol/L (ref 22–32)
Chloride: 96 mmol/L — ABNORMAL LOW (ref 101–111)
Creatinine, Ser: 1.37 mg/dL — ABNORMAL HIGH (ref 0.61–1.24)
GFR calc Af Amer: 51 mL/min — ABNORMAL LOW (ref >60)
GFR, EST NON AFRICAN AMERICAN: 44 mL/min — AB (ref >60)
GLUCOSE: 96 mg/dL (ref 65–99)
Potassium: 4 mmol/L (ref 3.5–5.1)
SODIUM: 128 mmol/L — AB (ref 135–145)

## 2016-07-19 LAB — HEMOGLOBIN A1C
HEMOGLOBIN A1C: 6.4 % — AB (ref 4.8–5.6)
Mean Plasma Glucose: 137 mg/dL

## 2016-07-19 MED ORDER — SODIUM CHLORIDE 0.9 % IV SOLN
INTRAVENOUS | Status: AC
  Administered 2016-07-19: 11:00:00 via INTRAVENOUS

## 2016-07-19 MED ORDER — PANTOPRAZOLE SODIUM 40 MG PO TBEC
40.0000 mg | DELAYED_RELEASE_TABLET | Freq: Every day | ORAL | Status: DC
  Administered 2016-07-19 – 2016-07-21 (×2): 40 mg via ORAL
  Filled 2016-07-19 (×3): qty 1

## 2016-07-19 MED ORDER — ALUM & MAG HYDROXIDE-SIMETH 200-200-20 MG/5ML PO SUSP
30.0000 mL | Freq: Four times a day (QID) | ORAL | Status: DC | PRN
  Administered 2016-07-19: 30 mL via ORAL
  Filled 2016-07-19: qty 30

## 2016-07-19 MED ORDER — POLYETHYLENE GLYCOL 3350 17 G PO PACK
17.0000 g | PACK | Freq: Every day | ORAL | Status: DC
  Administered 2016-07-19 – 2016-07-21 (×3): 17 g via ORAL
  Filled 2016-07-19 (×3): qty 1

## 2016-07-19 MED ORDER — RIVAROXABAN 15 MG PO TABS: 15.0000 mg | ORAL_TABLET | Freq: Every day | ORAL | Status: DC

## 2016-07-19 MED ORDER — MAGNESIUM SULFATE 2 GM/50ML IV SOLN
2.0000 g | Freq: Once | INTRAVENOUS | Status: AC
Start: 2016-07-19 — End: 2016-07-19
  Administered 2016-07-19: 2 g via INTRAVENOUS
  Filled 2016-07-19: qty 50

## 2016-07-19 NOTE — Progress Notes (Signed)
Patient c/o shoulder pain when lifting left arm. Patient states this is new since stroke. Education provided to patient and family regarding the need to move extremity in order to regain mobility of joint. Tylenol administered for pain. Patient and family v/u. RN will continue to monitor.

## 2016-07-19 NOTE — Progress Notes (Signed)
PROGRESS NOTE    [name]  WUJ:[id number]  DOB: 02-Sep-1926  DOA: 07/18/2016 PCP: [name], MD   Brief Admission Hx:  [name] is a 83 y.o. male with medical history significant for A. fib on Xarelto, CVA, CHF, chronic kidney disease stage III, COPD, anemia, CAD, hypertension, hyponatremia, presents to the emergency Department chief complaint left-sided weakness. Code stroke was activated he was evaluated by neurology who recommended admission for workup.  MDM/Assessment & Plan:    #1. Acute CVAs. History of same. Risk factors include hypertension hyperlipidemia A. Fib. Perfusion scan and CT angiogram of the head and neck revealing no obvious stroke. Evaluated by neurology who opined with right MCA stenosis concern for artery to artery embolus. -Admit to telemetry -MRI-Small region of acute infarction affecting the gyral surface of the precentral gyrus on the right. -Frequent neuro checks -Echocardiogram -PT and OT -Speech therapy as he failed the bedside swallow eval -Nothing by mouth for now -Aspirin  -Lipid panel optimal,  hemoglobin A1c pending  -Holding Xarelto for now pending neuro eval   #2. Hyponatremia. Sodium level 128. Chart review indicates he has a history of same. -Gentle IV fluids -Urine sodium and urine osmolality -Serum osmolality -Repeat the morning  #3. Chronic kidney disease. Stage III. Dressing 1.6 on admission. This appears to be close to baseline -Gentle IV fluids -Monitor urine output  #4. Hypertension. Blood pressure high end of normal in the emergency department. Home medications include Coreg  -permissive hypertension  -Monitor -Hydralazine with parameters as needed  #5. A. Fib. [place] score 5. On Xarelto.  -Follow EKG -Holding Xarelto for now  #6. Chronic systolic heart failure. Last echo was August 2017. EF was 40% and severe hypokinesis. -Holding beta blocker for now -Daily weights -Intake and  output -Echocardiogram as noted above  #7. COPD. Not on home oxygen. Appears stable at baseline -Continue home meds  #8. Anemia. Hemoglobin 10.9. Chart review indicates this is close to his baseline. No signs symptoms of active bleeding. Home medications to include Xarelto -FOBT -Anemia panel -Outpatient follow-up  DVT prophylaxis: scd  Code Status: full  Family Communication: son (interpreter) and wife  Disposition Plan: snf  Consults called: dr Amada Jupiter neurology  Admission status: inpatient   Subjective: Interpreter used: Pt denies complaints.   Objective: Vitals:   07/18/16 2040 07/19/16 0040 07/19/16 0531 07/19/16 1000  BP: (!) 144/85 133/77 (!) 147/85 (!) 151/86  Pulse: 80 (!) 55 73 80  Resp: 18  20 20   Temp: 97.8 F (36.6 C) 98.3 F (36.8 C) 98.6 F (37 C) 98.4 F (36.9 C)  TempSrc: Oral Oral Oral Oral  SpO2: 98% 95% 98% 100%    Intake/Output Summary (Last 24 hours) at 07/19/16 1020 Last data filed at 07/19/16 0835  Gross per 24 hour  Intake          1116.25 ml  Output             1050 ml  Net            66.25 ml   There were no vitals filed for this visit.   REVIEW OF SYSTEMS  As per history otherwise all reviewed and reported negative  Exam:  General exam: elderly chronically ill appearing male, lethargic but arousable.  Respiratory system: No increased work of breathing. Cardiovascular system: S1 & S2 heard Gastrointestinal system: Abdomen is nondistended, soft and nontender. Normal bowel sounds heard. Central nervous system: strength deficit LUE 2-3/5 Extremities: no cyanosis.  Data  Reviewed: Basic Metabolic Panel:  Recent Labs Lab 07/18/16 0344 07/18/16 0351 07/18/16 0757 07/19/16 0539  NA 124* 128*  --  128*  K 3.9 4.0  --  4.0  CL 93* 92*  --  96*  CO2 24  --   --  22  GLUCOSE 111* 109*  --  96  BUN 15 17  --  14  CREATININE 1.50* 1.60*  --  1.37*  CALCIUM 8.5*  --   --  8.9  MG  --   --  1.6*  --   PHOS  --   --  3.8   --    Liver Function Tests:  Recent Labs Lab 07/18/16 0344  AST 25  ALT 14*  ALKPHOS 83  BILITOT 1.3*  PROT 7.1  ALBUMIN 3.3*   No results for input(s): LIPASE, AMYLASE in the last 168 hours. No results for input(s): AMMONIA in the last 168 hours. CBC:  Recent Labs Lab 07/18/16 0344 07/18/16 0351 07/19/16 0539  WBC 7.3  --  7.0  NEUTROABS 5.3  --   --   HGB 10.2* 10.9* 11.2*  HCT 30.6* 32.0* 33.0*  MCV 90.8  --  91.2  PLT 144*  --  145*   Cardiac Enzymes: No results for input(s): CKTOTAL, CKMB, CKMBINDEX, TROPONINI in the last 168 hours. CBG (last 3)   Recent Labs  07/18/16 0341  GLUCAP 115*   No results found for this or any previous visit (from the past 240 hour(s)).   Studies: Ct Angio Head W Or Wo Contrast  Result Date: 07/18/2016 CLINICAL DATA:  Initial evaluation for acute aphasia with right-sided weakness. EXAM: CT ANGIOGRAPHY HEAD AND NECK CT PERFUSION BRAIN TECHNIQUE: Multidetector CT imaging of the head and neck was performed using the standard protocol during bolus administration of intravenous contrast. Multiplanar CT image reconstructions and MIPs were obtained to evaluate the vascular anatomy. Carotid stenosis measurements (when applicable) are obtained utilizing NASCET criteria, using the distal internal carotid diameter as the denominator. Multiphase CT imaging of the brain was performed following IV bolus contrast injection. Subsequent parametric perfusion maps were calculated using RAPID software. CONTRAST:  90 cc of Isovue 370. COMPARISON:  Prior noncontrast head CT from earlier the same day. FINDINGS: CTA NECK FINDINGS Aortic arch: Visualized aortic arch of normal caliber with normal branch pattern. The head be calcified noncalcified plaque within the arch itself with few small focal penetrating atheromatous plaques (series 9, image 319). No high-grade stenosis about the origin of the great vessels. Visualized subclavian artery is patent without  stenosis. Right carotid system: Right common carotid artery patent from its origin to the bifurcation without stenosis. Calcified noncalcified plaque about the right bifurcation/proximal right ICA without flow-limiting stenosis. Right ICA widely patent distally to the skullbase without stenosis, dissection, or occlusion. Left carotid system: Left common carotid artery patent from its origin to the bifurcation. Carotid artery's stent present at the left bifurcation extending into the proximal left ICA and. There is patent flow through the stent, although there is moderate narrowing at the distal aspect of the stent by approximately 70% (series 9, image 200). Left ICA widely patent distally to the skullbase without stenosis, dissection, or occlusion. Vertebral arteries: Both of the vertebral arteries arise from the subclavian arteries. Left vertebral artery dominant. Focal moderate narrowing of the left AV 2 segment at the level of C3 related to extrinsic compression from degenerative changes (series 9, image 202). Vertebral artery is otherwise patent within the neck without stenosis,  dissection, or occlusion. Skeleton: No acute osseus abnormality. No worrisome lytic or blastic osseous lesions. Moderate to advanced multilevel degenerative spondylolysis noted within cervical spine. Other neck: No acute soft tissue abnormality within the neck. No adenopathy. Thyroid normal. Upper chest: Visualized upper chest within normal limits. Partially visualized lungs are clear. Review of the MIP images confirms the above findings CTA HEAD FINDINGS Anterior circulation: The petrous segments patent bilaterally without stenosis. Multifocal atheromatous plaque within the cavernous/ supraclinoid ICAs with mild to moderate multifocal narrowing. No high-grade stenosis. ICA termini widely patent. Left A1 segment dominant and patent. Right A1 segment slightly diminutive but patent as well. Anterior cerebral artery is widely patent to  their distal aspects. Left M1 segment mildly irregular but patent to its distal aspect without significant stenosis. No proximal left M2 occlusion. Distal left MCA branches widely patent. Left M1 segment widely patent at its origin. The right M1 smoothly taper is at its mid and distal aspect with up twos 75% stenosis at its distal aspect. No filling defect to suggest intraluminal thrombus or other significant intimal irregularity to suggest that this is acute in nature. Stenosis extends into the anterior right temporal branch. Posterior branches attenuated and small as well. No proximal M2 occlusion. Distal right MCA branches well opacified and symmetric with the left. Posterior circulation: Scattered plaque present within the dominant left V4 segment without flow-limiting stenosis. Hypoplastic right vertebral artery divides at the takeoff of the right PICA, with a small ascending branch ascending towards the vertebrobasilar junction. Right PICA patent. Left PICA not visualized. Basilar artery demonstrates mild multifocal atheromatous irregularity but is widely patent to its distal aspect. Superior cerebral arteries patent bilaterally. Right PCA largely supplied via the basilar artery and is widely patent to its distal aspect. Small right posterior communicating artery noted as well. Predominant fetal type left PCA supplied via a widely patent left posterior communicating artery. Left PCA patent to its distal aspect. Venous sinuses: Patent. Anatomic variants: No significant anatomic variant. No aneurysm or vascular malformation. Delayed phase: Not performed. Review of the MIP images confirms the above findings CT Brain Perfusion Findings: CBF (<30%) Volume: 0mL Perfusion (Tmax>6.0s) volume: 0mL Mismatch Volume: 0mL Infarction Location: IMPRESSION: 1. Negative CTA for emergent large vessel occlusion. 2. Negative CT perfusion. No evidence for acute infarct or perfusion mismatch. 3. Moderate to severe diffuse narrowing  of the mid-distal right M1 segment to approximately 75%. This is presumably chronic in nature given the lack of perfusion mismatch. 4. Patent left carotid artery stent. Moderate narrowing at the distal aspect of the stent to approximately 70% 5. Prominent atheromatous disease involving the visualized aortic arch, with additional mild to moderate multifocal atherosclerotic changes as above. Results were called by telephone at the time of interpretation on 07/18/2016 at 4:38 am to Dr. Ritta Slot , who verbally acknowledged these results. Electronically Signed   By: Rise Mu M.D.   On: 07/18/2016 05:12   Ct Angio Neck W Or Wo Contrast  Result Date: 07/18/2016 CLINICAL DATA:  Initial evaluation for acute aphasia with right-sided weakness. EXAM: CT ANGIOGRAPHY HEAD AND NECK CT PERFUSION BRAIN TECHNIQUE: Multidetector CT imaging of the head and neck was performed using the standard protocol during bolus administration of intravenous contrast. Multiplanar CT image reconstructions and MIPs were obtained to evaluate the vascular anatomy. Carotid stenosis measurements (when applicable) are obtained utilizing NASCET criteria, using the distal internal carotid diameter as the denominator. Multiphase CT imaging of the brain was performed following IV bolus contrast injection.  Subsequent parametric perfusion maps were calculated using RAPID software. CONTRAST:  90 cc of Isovue 370. COMPARISON:  Prior noncontrast head CT from earlier the same day. FINDINGS: CTA NECK FINDINGS Aortic arch: Visualized aortic arch of normal caliber with normal branch pattern. The head be calcified noncalcified plaque within the arch itself with few small focal penetrating atheromatous plaques (series 9, image 319). No high-grade stenosis about the origin of the great vessels. Visualized subclavian artery is patent without stenosis. Right carotid system: Right common carotid artery patent from its origin to the bifurcation without  stenosis. Calcified noncalcified plaque about the right bifurcation/proximal right ICA without flow-limiting stenosis. Right ICA widely patent distally to the skullbase without stenosis, dissection, or occlusion. Left carotid system: Left common carotid artery patent from its origin to the bifurcation. Carotid artery's stent present at the left bifurcation extending into the proximal left ICA and. There is patent flow through the stent, although there is moderate narrowing at the distal aspect of the stent by approximately 70% (series 9, image 200). Left ICA widely patent distally to the skullbase without stenosis, dissection, or occlusion. Vertebral arteries: Both of the vertebral arteries arise from the subclavian arteries. Left vertebral artery dominant. Focal moderate narrowing of the left AV 2 segment at the level of C3 related to extrinsic compression from degenerative changes (series 9, image 202). Vertebral artery is otherwise patent within the neck without stenosis, dissection, or occlusion. Skeleton: No acute osseus abnormality. No worrisome lytic or blastic osseous lesions. Moderate to advanced multilevel degenerative spondylolysis noted within cervical spine. Other neck: No acute soft tissue abnormality within the neck. No adenopathy. Thyroid normal. Upper chest: Visualized upper chest within normal limits. Partially visualized lungs are clear. Review of the MIP images confirms the above findings CTA HEAD FINDINGS Anterior circulation: The petrous segments patent bilaterally without stenosis. Multifocal atheromatous plaque within the cavernous/ supraclinoid ICAs with mild to moderate multifocal narrowing. No high-grade stenosis. ICA termini widely patent. Left A1 segment dominant and patent. Right A1 segment slightly diminutive but patent as well. Anterior cerebral artery is widely patent to their distal aspects. Left M1 segment mildly irregular but patent to its distal aspect without significant  stenosis. No proximal left M2 occlusion. Distal left MCA branches widely patent. Left M1 segment widely patent at its origin. The right M1 smoothly taper is at its mid and distal aspect with up twos 75% stenosis at its distal aspect. No filling defect to suggest intraluminal thrombus or other significant intimal irregularity to suggest that this is acute in nature. Stenosis extends into the anterior right temporal branch. Posterior branches attenuated and small as well. No proximal M2 occlusion. Distal right MCA branches well opacified and symmetric with the left. Posterior circulation: Scattered plaque present within the dominant left V4 segment without flow-limiting stenosis. Hypoplastic right vertebral artery divides at the takeoff of the right PICA, with a small ascending branch ascending towards the vertebrobasilar junction. Right PICA patent. Left PICA not visualized. Basilar artery demonstrates mild multifocal atheromatous irregularity but is widely patent to its distal aspect. Superior cerebral arteries patent bilaterally. Right PCA largely supplied via the basilar artery and is widely patent to its distal aspect. Small right posterior communicating artery noted as well. Predominant fetal type left PCA supplied via a widely patent left posterior communicating artery. Left PCA patent to its distal aspect. Venous sinuses: Patent. Anatomic variants: No significant anatomic variant. No aneurysm or vascular malformation. Delayed phase: Not performed. Review of the MIP images confirms the above  findings CT Brain Perfusion Findings: CBF (<30%) Volume: 0mL Perfusion (Tmax>6.0s) volume: 0mL Mismatch Volume: 0mL Infarction Location: IMPRESSION: 1. Negative CTA for emergent large vessel occlusion. 2. Negative CT perfusion. No evidence for acute infarct or perfusion mismatch. 3. Moderate to severe diffuse narrowing of the mid-distal right M1 segment to approximately 75%. This is presumably chronic in nature given the  lack of perfusion mismatch. 4. Patent left carotid artery stent. Moderate narrowing at the distal aspect of the stent to approximately 70% 5. Prominent atheromatous disease involving the visualized aortic arch, with additional mild to moderate multifocal atherosclerotic changes as above. Results were called by telephone at the time of interpretation on 07/18/2016 at 4:38 am to Dr. Ritta Slot , who verbally acknowledged these results. Electronically Signed   By: Rise Mu M.D.   On: 07/18/2016 05:12   Mr Brain Wo Contrast  Result Date: 07/18/2016 CLINICAL DATA:  Acute presentation with sudden onset weakness on the right and aphasia. EXAM: MRI HEAD WITHOUT CONTRAST TECHNIQUE: Multiplanar, multiecho pulse sequences of the brain and surrounding structures were obtained without intravenous contrast. COMPARISON:  CT study same day FINDINGS: Brain: Diffusion imaging shows acute infarction along the gyral surface of the precentral gyrus on the right. No evidence of swelling or hemorrhage. The brainstem is normal few old small vessel cerebellar infarctions. Cerebral hemispheres show old hemorrhagic lacunar infarction affecting the right lateral thalamus/ posterior limb internal capsule, an old right posterior frontal cortical and subcortical infarction, small old left parietal cortical and subcortical infarctions, and chronic small-vessel ischemic changes of the cerebral hemispheric white matter. Some residual hemosiderin associated with a small left frontal infarction. No evidence of neoplastic mass lesion, hydrocephalus or extra-axial collection. Vascular: Major vessels at the base of the brain show flow. Skull and upper cervical spine: Negative Sinuses/Orbits: Clear/ normal.  Small mastoid effusion on the right Other: None significant IMPRESSION: Small region of acute infarction affecting the gyral surface of the precentral gyrus on the right. Extensive chronic small vessel ischemic changes as  outlined above, with old hemosiderin deposition in the right lateral thalamus/posterior limb internal capsule and left frontal cortical/subcortical brain. Small old cortical infarctions in the right posterior frontal, left posterior frontal, and left parietal regions. Electronically Signed   By: Paulina Fusi M.D.   On: 07/18/2016 07:03   Ct Cerebral Perfusion W Contrast  Result Date: 07/18/2016 CLINICAL DATA:  Initial evaluation for acute aphasia with right-sided weakness. EXAM: CT ANGIOGRAPHY HEAD AND NECK CT PERFUSION BRAIN TECHNIQUE: Multidetector CT imaging of the head and neck was performed using the standard protocol during bolus administration of intravenous contrast. Multiplanar CT image reconstructions and MIPs were obtained to evaluate the vascular anatomy. Carotid stenosis measurements (when applicable) are obtained utilizing NASCET criteria, using the distal internal carotid diameter as the denominator. Multiphase CT imaging of the brain was performed following IV bolus contrast injection. Subsequent parametric perfusion maps were calculated using RAPID software. CONTRAST:  90 cc of Isovue 370. COMPARISON:  Prior noncontrast head CT from earlier the same day. FINDINGS: CTA NECK FINDINGS Aortic arch: Visualized aortic arch of normal caliber with normal branch pattern. The head be calcified noncalcified plaque within the arch itself with few small focal penetrating atheromatous plaques (series 9, image 319). No high-grade stenosis about the origin of the great vessels. Visualized subclavian artery is patent without stenosis. Right carotid system: Right common carotid artery patent from its origin to the bifurcation without stenosis. Calcified noncalcified plaque about the right bifurcation/proximal right ICA without  flow-limiting stenosis. Right ICA widely patent distally to the skullbase without stenosis, dissection, or occlusion. Left carotid system: Left common carotid artery patent from its origin  to the bifurcation. Carotid artery's stent present at the left bifurcation extending into the proximal left ICA and. There is patent flow through the stent, although there is moderate narrowing at the distal aspect of the stent by approximately 70% (series 9, image 200). Left ICA widely patent distally to the skullbase without stenosis, dissection, or occlusion. Vertebral arteries: Both of the vertebral arteries arise from the subclavian arteries. Left vertebral artery dominant. Focal moderate narrowing of the left AV 2 segment at the level of C3 related to extrinsic compression from degenerative changes (series 9, image 202). Vertebral artery is otherwise patent within the neck without stenosis, dissection, or occlusion. Skeleton: No acute osseus abnormality. No worrisome lytic or blastic osseous lesions. Moderate to advanced multilevel degenerative spondylolysis noted within cervical spine. Other neck: No acute soft tissue abnormality within the neck. No adenopathy. Thyroid normal. Upper chest: Visualized upper chest within normal limits. Partially visualized lungs are clear. Review of the MIP images confirms the above findings CTA HEAD FINDINGS Anterior circulation: The petrous segments patent bilaterally without stenosis. Multifocal atheromatous plaque within the cavernous/ supraclinoid ICAs with mild to moderate multifocal narrowing. No high-grade stenosis. ICA termini widely patent. Left A1 segment dominant and patent. Right A1 segment slightly diminutive but patent as well. Anterior cerebral artery is widely patent to their distal aspects. Left M1 segment mildly irregular but patent to its distal aspect without significant stenosis. No proximal left M2 occlusion. Distal left MCA branches widely patent. Left M1 segment widely patent at its origin. The right M1 smoothly taper is at its mid and distal aspect with up twos 75% stenosis at its distal aspect. No filling defect to suggest intraluminal thrombus or  other significant intimal irregularity to suggest that this is acute in nature. Stenosis extends into the anterior right temporal branch. Posterior branches attenuated and small as well. No proximal M2 occlusion. Distal right MCA branches well opacified and symmetric with the left. Posterior circulation: Scattered plaque present within the dominant left V4 segment without flow-limiting stenosis. Hypoplastic right vertebral artery divides at the takeoff of the right PICA, with a small ascending branch ascending towards the vertebrobasilar junction. Right PICA patent. Left PICA not visualized. Basilar artery demonstrates mild multifocal atheromatous irregularity but is widely patent to its distal aspect. Superior cerebral arteries patent bilaterally. Right PCA largely supplied via the basilar artery and is widely patent to its distal aspect. Small right posterior communicating artery noted as well. Predominant fetal type left PCA supplied via a widely patent left posterior communicating artery. Left PCA patent to its distal aspect. Venous sinuses: Patent. Anatomic variants: No significant anatomic variant. No aneurysm or vascular malformation. Delayed phase: Not performed. Review of the MIP images confirms the above findings CT Brain Perfusion Findings: CBF (<30%) Volume: 49mL Perfusion (Tmax>6.0s) volume: 75mL Mismatch Volume: 69mL Infarction Location: IMPRESSION: 1. Negative CTA for emergent large vessel occlusion. 2. Negative CT perfusion. No evidence for acute infarct or perfusion mismatch. 3. Moderate to severe diffuse narrowing of the mid-distal right M1 segment to approximately 75%. This is presumably chronic in nature given the lack of perfusion mismatch. 4. Patent left carotid artery stent. Moderate narrowing at the distal aspect of the stent to approximately 70% 5. Prominent atheromatous disease involving the visualized aortic arch, with additional mild to moderate multifocal atherosclerotic changes as above.  Results were called  by telephone at the time of interpretation on 07/18/2016 at 4:38 am to Dr. Ritta Slot , who verbally acknowledged these results. Electronically Signed   By: Rise Mu M.D.   On: 07/18/2016 05:12   Ct Head Code Stroke W/o Cm  Result Date: 07/18/2016 CLINICAL DATA:  Code stroke. Initial evaluation for acute aphasia, right-sided weakness peer EXAM: CT HEAD WITHOUT CONTRAST TECHNIQUE: Contiguous axial images were obtained from the base of the skull through the vertex without intravenous contrast. COMPARISON:  Prior CT from 06/17/2014. FINDINGS: Brain: Moderate cerebral atrophy with chronic small vessel ischemic disease. Remote anterior right MCA territory infarct. Additional small remote left ACA/MCA territory infarct noted at the vertex. No acute intracranial hemorrhage. No acute large vessel territory infarct. No mass lesion, midline shift or mass effect. No hydrocephalus. No extra-axial fluid collection. Vascular: No hyperdense vessel. Prominent intracranial atherosclerosis. Skull: Scalp soft tissues and calvarium within normal limits. Sinuses/Orbits: Globes and oval soft tissues within normal limits. Paranasal sinuses are clear. Right mastoid effusion noted. ASPECTS [facility] Stroke Program Early CT Score) - Ganglionic level infarction (caudate, lentiform nuclei, internal capsule, insula, M1-M3 cortex): 7 - Supraganglionic infarction (M4-M6 cortex): 3 Total score (0-10 with 10 being normal): 10 IMPRESSION: 1. No acute intracranial infarct or other process identified. 2. ASPECTS is 10 3. Moderate cerebral atrophy with chronic microvascular ischemic disease and scattered remote infarcts as above. Critical Value/emergent results were called by telephone at the time of interpretation on 07/18/2016 at 4:18 am to Dr. Amada Jupiter , who verbally acknowledged these results. Electronically Signed   By: Rise Mu M.D.   On: 07/18/2016 04:21     Scheduled Meds: . aspirin   300 mg Rectal Daily   Or  . aspirin  325 mg Oral Daily  . budesonide  0.5 mg Nebulization BID  . ipratropium-albuterol  3 mL Nebulization BID   Continuous Infusions:  Principal Problem:   Stroke [facility]) Active Problems:   Hypertension   PVD (peripheral vascular disease) 3.5cm AAA Aug 2014   Hyperlipidemia   AAA (abdominal aortic aneurysm) without rupture (HCC)   Chronic systolic heart failure (HCC)   Paroxysmal atrial fibrillation (HCC)   Hyponatremia   CKD (chronic kidney disease), stage III   Carotid artery disease (HCC)   COPD, group B, by GOLD 2017 classification (HCC)   Anemia   Non-English speaking patient   Time spent:   Standley Dakins, MD, FAAFP [facility] Pager [phone]  If 7PM-7AM, please contact night-coverage [email or web address] Password [email or web address] 07/19/2016, 10:20 AM    LOS: 1 day

## 2016-07-19 NOTE — Evaluation (Signed)
Occupational Therapy Evaluation Patient Details Name: [name] MRN: [id number] DOB: 1926/12/17 Today's Date: 07/19/2016    History of Present Illness Pt adm with LUE weakness and decr responsiveness. MRI showed acute infarct rt precentral gyrus. PMH -HTN, gout, CAD, CHF, AAA, CVA, copd   Clinical Impression   PTA, pt required occasional assistance for ADL and functional mobility and typically ambulates without assistive devices. He currently requires min assist for toilet transfers and LB ADL and min guard assist for standing grooming tasks. Pt presents with decreased L UE strength and coordination as well as slight inattention to L side of visual field impacting ability to participate in ADL at PLOF. He was unable to understand the [place] interpreter via phone with [place] interpreters and family assisting as needed. He would benefit from continued OT services while admitted to improve independence with ADL and functional mobility. Recommend outpatient OT services post-acute D/C in order to maximize functional independence. OT will continue to follow while admitted.    Follow Up Recommendations  Outpatient OT;Supervision/Assistance - 24 hour    Equipment Recommendations  3 in 1 bedside commode    Recommendations for Other Services       Precautions / Restrictions Precautions Precautions: Fall Restrictions Weight Bearing Restrictions: No      Mobility Bed Mobility Overal bed mobility: Needs Assistance Bed Mobility: Supine to Sit     Supine to sit: Min guard     General bed mobility comments: Min guard for safety from family member  Transfers Overall transfer level: Needs assistance Equipment used: Rolling walker (2 wheeled) Transfers: Sit to/from Stand Sit to Stand: Min guard         General transfer comment: Assist for safety    Balance Overall balance assessment: Needs assistance Sitting-balance support: No upper extremity supported;Feet supported Sitting  balance-Leahy Scale: Good     Standing balance support: No upper extremity supported;During functional activity Standing balance-Leahy Scale: Fair Standing balance comment: Able to statically stand without UE support for ADL.                           ADL either performed or assessed with clinical judgement   ADL   Eating/Feeding: Set up;Sitting   Grooming: Standing;Min guard   Upper Body Bathing: Supervision/ safety;Sitting   Lower Body Bathing: Sit to/from stand;Minimal assistance   Upper Body Dressing : Supervision/safety;Sitting   Lower Body Dressing: Minimal assistance;Sit to/from stand   Toilet Transfer: Minimal Pharmacist, community Details (indicate cue type and reason): Min assist and VC's to avoid running into items on the L  Toileting- Clothing Manipulation and Hygiene: Min guard       Functional mobility during ADLs: Minimal assistance;Rolling walker General ADL Comments: VC's for safe use of RW and to avoid running into items on the L. Family reports pt is close to baseline other than pain in abdomen from constipation. Pt with no complaint of constipation during session.      Vision Patient Visual Report: No change from baseline Additional Comments: Pt with difficulty navigating often running into items to the L potentially indicating inattention.      Perception     Praxis      Pertinent Vitals/Pain Pain Assessment: Faces Faces Pain Scale: No hurt     Hand Dominance Right   Extremity/Trunk Assessment Upper Extremity Assessment Upper Extremity Assessment: LUE deficits/detail LUE Deficits / Details: Decreased strength as compared to R with 4/5 strength grossly. LUE Coordination: decreased  fine motor;decreased gross motor   Lower Extremity Assessment Lower Extremity Assessment: Generalized weakness       Communication Communication Communication: Prefers language other than English (Pt unable to understand [place]  interpreter; family assisted)   Cognition Arousal/Alertness: Awake/alert Behavior During Therapy: WFL for tasks assessed/performed Overall Cognitive Status: History of cognitive impairments - at baseline                                 General Comments: Pt able to follow commands appropriately   General Comments       Exercises     Shoulder Instructions      Home Living Family/patient expects to be discharged to:: Private residence Living Arrangements: Spouse/significant other;Children Available Help at Discharge: Family;Available 24 hours/day Type of Home: House Home Access: Stairs to enter [employer] of Steps: 3 Entrance Stairs-Rails: None Home Layout: One level     Bathroom Shower/Tub: Tub only   Firefighter: Standard     Home Equipment: Environmental consultant - 2 wheels;Cane - single point          Prior Functioning/Environment Level of Independence: Needs assistance  Gait / Transfers Assistance Needed: Pt amb without assistive device most of time. Family assist at times. Daughter reports pt tends to push RW too far out in front  ADL's / Homemaking Assistance Needed: occasional assistance            OT Problem List: Decreased strength;Decreased range of motion;Decreased activity tolerance;Impaired balance (sitting and/or standing);Decreased safety awareness;Impaired UE functional use;Pain      OT Treatment/Interventions: Self-care/ADL training;Therapeutic exercise;Energy conservation;DME and/or AE instruction;Therapeutic activities;Patient/family education;Balance training    OT Goals(Current goals can be found in the care plan section) Acute Rehab OT Goals Patient Stated Goal: to go home per family OT Goal Formulation: With patient/family Time For Goal Achievement: 08/02/16 Potential to Achieve Goals: Good  OT Frequency: Min 2X/week   Barriers to D/C:            Co-evaluation              AM-PAC PT "6 Clicks" Daily Activity      Outcome Measure Help from another person eating meals?: A Little Help from another person taking care of personal grooming?: A Little Help from another person toileting, which includes using toliet, bedpan, or urinal?: A Little Help from another person bathing (including washing, rinsing, drying)?: A Little Help from another person to put on and taking off regular upper body clothing?: A Little Help from another person to put on and taking off regular lower body clothing?: A Little 6 Click Score: 18   End of Session Equipment Utilized During Treatment: Gait belt;Rolling walker Nurse Communication: Mobility status  Activity Tolerance: Patient tolerated treatment well Patient left: in chair;with call bell/phone within reach;with family/visitor present  OT Visit Diagnosis: Unsteadiness on feet (R26.81);Hemiplegia and hemiparesis Hemiplegia - Right/Left: Left Hemiplegia - dominant/non-dominant: Non-Dominant Hemiplegia - caused by: Cerebral infarction                Time: 1530-1553 OT Time Calculation (min): 23 min Charges:  OT General Charges $OT Visit: 1 Procedure OT Evaluation $OT Eval Moderate Complexity: 1 Procedure OT Treatments $Self Care/Home Management : 8-22 mins G-Codes:     Doristine Section, MS OTR/L  Pager: [phone]   Mckenzee Beem A Khris Jansson 07/19/2016, 6:25 PM

## 2016-07-19 NOTE — Progress Notes (Signed)
ANTICOAGULATION CONSULT NOTE  Pharmacy Consult for Xarelto Indication: atrial fibrillation   Assessment: 43 yom with history of afib on Xarelto 15mg  daily admitted as code stroke. Pharmacy consulted to resume Xarelto 6/9 PM. CBC low stable - Hg 11.2, plt 145. SCr trend down to 1.37. No bleed documented.  Goal of Therapy:  Stroke prevention Monitor platelets by anticoagulation protocol: Yes   Plan:  Resume Xarelto 15mg  PO Qsupper from PTA Monitor CBC, s/sx bleeding  Babs Bertin, PharmD, BCPS Clinical Pharmacist 07/19/2016 6:31 PM

## 2016-07-20 DIAGNOSIS — I739 Peripheral vascular disease, unspecified: Secondary | ICD-10-CM

## 2016-07-20 DIAGNOSIS — I6319 Cerebral infarction due to embolism of other precerebral artery: Secondary | ICD-10-CM

## 2016-07-20 LAB — URINALYSIS, ROUTINE W REFLEX MICROSCOPIC
Bilirubin Urine: NEGATIVE
Glucose, UA: NEGATIVE mg/dL
Hgb urine dipstick: NEGATIVE
Ketones, ur: NEGATIVE mg/dL
LEUKOCYTES UA: NEGATIVE
Nitrite: NEGATIVE
PROTEIN: NEGATIVE mg/dL
SPECIFIC GRAVITY, URINE: 1.02 (ref 1.005–1.030)
pH: 5 (ref 5.0–8.0)

## 2016-07-20 LAB — OSMOLALITY, URINE: Osmolality, Ur: 483 mOsm/kg (ref 300–900)

## 2016-07-20 LAB — COMPREHENSIVE METABOLIC PANEL
ALBUMIN: 3.4 g/dL — AB (ref 3.5–5.0)
ALT: 13 U/L — AB (ref 17–63)
AST: 21 U/L (ref 15–41)
Alkaline Phosphatase: 76 U/L (ref 38–126)
Anion gap: 8 (ref 5–15)
BUN: 15 mg/dL (ref 6–20)
CHLORIDE: 97 mmol/L — AB (ref 101–111)
CO2: 23 mmol/L (ref 22–32)
CREATININE: 1.36 mg/dL — AB (ref 0.61–1.24)
Calcium: 8.9 mg/dL (ref 8.9–10.3)
GFR calc Af Amer: 52 mL/min — ABNORMAL LOW (ref >60)
GFR calc non Af Amer: 45 mL/min — ABNORMAL LOW (ref >60)
GLUCOSE: 117 mg/dL — AB (ref 65–99)
Potassium: 3.6 mmol/L (ref 3.5–5.1)
Sodium: 128 mmol/L — ABNORMAL LOW (ref 135–145)
Total Bilirubin: 1.7 mg/dL — ABNORMAL HIGH (ref 0.3–1.2)
Total Protein: 7.4 g/dL (ref 6.5–8.1)

## 2016-07-20 LAB — MAGNESIUM: Magnesium: 2.4 mg/dL (ref 1.7–2.4)

## 2016-07-20 LAB — CBC
HEMATOCRIT: 32.2 % — AB (ref 39.0–52.0)
Hemoglobin: 11.2 g/dL — ABNORMAL LOW (ref 13.0–17.0)
MCH: 31.5 pg (ref 26.0–34.0)
MCHC: 34.8 g/dL (ref 30.0–36.0)
MCV: 90.4 fL (ref 78.0–100.0)
Platelets: 136 10*3/uL — ABNORMAL LOW (ref 150–400)
RBC: 3.56 MIL/uL — ABNORMAL LOW (ref 4.22–5.81)
RDW: 15.2 % (ref 11.5–15.5)
WBC: 7.9 10*3/uL (ref 4.0–10.5)

## 2016-07-20 LAB — SODIUM, URINE, RANDOM: SODIUM UR: 36 mmol/L

## 2016-07-20 MED ORDER — ACETAMINOPHEN 325 MG PO TABS
650.0000 mg | ORAL_TABLET | Freq: Four times a day (QID) | ORAL | Status: DC
  Administered 2016-07-20 – 2016-07-21 (×6): 650 mg via ORAL
  Filled 2016-07-20 (×6): qty 2

## 2016-07-20 MED ORDER — IPRATROPIUM-ALBUTEROL 0.5-2.5 (3) MG/3ML IN SOLN: 3.0000 mL | RESPIRATORY_TRACT | Status: DC | PRN

## 2016-07-20 MED ORDER — NITROGLYCERIN 0.4 MG SL SUBL: 0.4000 mg | SUBLINGUAL_TABLET | SUBLINGUAL | Status: DC | PRN

## 2016-07-20 MED ORDER — ARFORMOTEROL TARTRATE 15 MCG/2ML IN NEBU
15.0000 ug | INHALATION_SOLUTION | Freq: Two times a day (BID) | RESPIRATORY_TRACT | Status: DC
  Filled 2016-07-20: qty 2

## 2016-07-20 MED ORDER — CARVEDILOL 3.125 MG PO TABS
3.1250 mg | ORAL_TABLET | Freq: Two times a day (BID) | ORAL | Status: DC
  Administered 2016-07-20 – 2016-07-21 (×2): 3.125 mg via ORAL
  Filled 2016-07-20 (×2): qty 1

## 2016-07-20 MED ORDER — ATORVASTATIN CALCIUM 40 MG PO TABS
40.0000 mg | ORAL_TABLET | Freq: Every day | ORAL | Status: DC
  Administered 2016-07-20: 40 mg via ORAL
  Filled 2016-07-20: qty 1

## 2016-07-20 MED ORDER — TAMSULOSIN HCL 0.4 MG PO CAPS
0.4000 mg | ORAL_CAPSULE | Freq: Every day | ORAL | Status: DC
  Administered 2016-07-20 – 2016-07-21 (×2): 0.4 mg via ORAL
  Filled 2016-07-20 (×2): qty 1

## 2016-07-20 MED ORDER — ARFORMOTEROL TARTRATE 15 MCG/2ML IN NEBU
15.0000 ug | INHALATION_SOLUTION | Freq: Two times a day (BID) | RESPIRATORY_TRACT | Status: DC
  Filled 2016-07-20 (×4): qty 2

## 2016-07-20 MED ORDER — ALLOPURINOL 100 MG PO TABS
300.0000 mg | ORAL_TABLET | Freq: Every day | ORAL | Status: DC
  Administered 2016-07-20 – 2016-07-21 (×2): 300 mg via ORAL
  Filled 2016-07-20 (×2): qty 3

## 2016-07-20 MED ORDER — DABIGATRAN ETEXILATE MESYLATE 75 MG PO CAPS
75.0000 mg | ORAL_CAPSULE | Freq: Two times a day (BID) | ORAL | Status: DC
  Administered 2016-07-20 – 2016-07-21 (×2): 75 mg via ORAL
  Filled 2016-07-20 (×3): qty 1

## 2016-07-20 MED ORDER — ISOSORBIDE MONONITRATE ER 30 MG PO TB24
30.0000 mg | ORAL_TABLET | Freq: Every day | ORAL | Status: DC
  Administered 2016-07-20 – 2016-07-21 (×2): 30 mg via ORAL
  Filled 2016-07-20 (×2): qty 1

## 2016-07-20 NOTE — Progress Notes (Signed)
ANTICOAGULATION CONSULT NOTE - Initial Consult  Pharmacy Consult for pradaxa Indication: atrial fibrillation  Allergies  Allergen Reactions  . Levofloxacin Anaphylaxis and Other (See Comments)    Patient told his daughter that it made him "feel worse than he already did" (overall) Headache also (has refused to take anymore)  . Eliquis [Apixaban] Itching and Swelling  . Phenazopyridine Hcl Other (See Comments)    Unknown reaction  . Septra [Sulfamethoxazole-Trimethoprim] Nausea And Vomiting    GI Upset  . Latex Rash  . Tape Rash    Patient Measurements:     Vital Signs: Temp: 98.8 F (37.1 C) (06/10 1300) Temp Source: Oral (06/10 1300) BP: 130/84 (06/10 1300) Pulse Rate: 84 (06/10 1300)  Labs:  Recent Labs  07/18/16 0344 07/18/16 0351 07/19/16 0539 07/20/16 0346  HGB 10.2* 10.9* 11.2* 11.2*  HCT 30.6* 32.0* 33.0* 32.2*  PLT 144*  --  145* 136*  APTT 45*  --   --   --   LABPROT 15.2  --   --   --   INR 1.19  --   --   --   CREATININE 1.50* 1.60* 1.37* 1.36*    CrCl cannot be calculated (Unknown ideal weight.).   Medical History: Past Medical History:  Diagnosis Date  . AAA (abdominal aortic aneurysm) (HCC)   . Anemia   . Asthma   . Carotid artery disease (HCC)   . CHF (congestive heart failure) (HCC)   . Coronary artery disease   . Depression   . Gout   . H. pylori infection   . Hard of hearing   . Hiatal hernia   . Hyperplasia, prostate   . Hypertension   . PUD (peptic ulcer disease)   . Renal failure, acute (HCC) 11/08/2012    Assessment: 83 yo male with afib on Xarelto PTA. Patient has had recurrent stroke on Xarelto. Pharmacy has been consulted to switch patient to Pradaxa due to stroke on Xarelto. SCr-1.36 and CrCl ~ 30 ml/min. CrCl has been less than 30 ml/min for most of admission so will dose adjust for now.    Goal of Therapy:  Monitor platelets by anticoagulation protocol: Yes   Plan:  Pradaxa 75 mg twice a day Monitor renal  function for dose adjustments  Monitor for signs/symptoms of bleeding.  Pharmacy will sign-off. Please re-consult as needed.    Hillis Range, PharmD PGY1 Pharmacy Resident Pager: [phone] 07/20/2016,4:45 PM

## 2016-07-20 NOTE — Progress Notes (Signed)
Patient [name] 07/19/16 per patient family report. Patient c/o abdominal cramping, bloating. Patient bowels sounds active, minor distention. RN encouraged patient to ambulate and drink more water. RN will continue to monitor.

## 2016-07-20 NOTE — Progress Notes (Signed)
PROGRESS NOTE    [name]  NWG:[id number]  DOB: July 11, 1926  DOA: 07/18/2016 PCP: [name], MD   Brief Admission Hx:  [name] is a 83 y.o. male with medical history significant for A. fib on Xarelto, CVA, CHF, chronic kidney disease stage III, COPD, anemia, CAD, hypertension, hyponatremia, presents to the emergency Department chief complaint left-sided weakness. Code stroke was activated he was evaluated by neurology who recommended admission for workup.  MDM/Assessment & Plan:   #1. Acute CVAs. History of same. Risk factors include hypertension hyperlipidemia A. Fib. Perfusion scan and CT angiogram of the head and neck revealing no obvious stroke. Evaluated by neurology who opined with right MCA stenosis concern for artery to artery embolus. -Admit to telemetry -MRI-Small region of acute infarction affecting the gyral surface of the precentral gyrus on the right. -Frequent neuro checks -Echocardiogram:The right ventricular systolic pressure was increased consistent  with moderate pulmonary hypertension -PT and OT recommending SNF.  Unsure if family will agree to this, consulted Child psychotherapist.  -Speech therapy placed him on dysphagia 2 diet.  -Aspirin  -Lipid panel optimal,  hemoglobin A1c pending  -resumed xarelto   #2. Chronic Hyponatremia. Sodium level 128. Chart review indicates he has a history of same. 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 Chronic kidney disease. Stage III. Dressing 1.6 on admission. This appears to be close to baseline -Monitor urine output  #4. Hypertension. Blood pressure high end of normal in the emergency department. Home medications include Coreg  -permissive hypertension  -Monitor -Hydralazine with parameters as needed  #5. A. Fib. [place] score 5. On Xarelto.  -Follow EKG -resumed home Xarelto  #6. Chronic systolic heart  failure. Last echo was August 2017. EF was 45% with recent echo -Holding beta blocker for now -Daily weights -Intake and output -Echocardiogram as noted above  #7. COPD. Not on home oxygen. Appears stable at baseline -Continue home meds  #8. Anemia. Hemoglobin 10.9. Chart review indicates this is close to his baseline. No signs symptoms of active bleeding. Home medications to include Xarelto -FOBT -Anemia panel -Outpatient follow-up  #9. Left shoulder pain - Pt has OA in the joint and occasional flare ups of pain, troponin was 0.00. No chest pain or SOB. Will schedule tylenol to be given regularly around the clock to control inflammation and pain, escalate pain medication if this is not working to control symptoms.   DVT prophylaxis: scd  Code Status: full  Family Communication: son (interpreter) and wife  Disposition Plan: ?snf family is likely going to take him home Consults called: dr Amada Jupiter neurology  Admission status: inpatient   Subjective: Interpreter used: Pt complains of left shoulder pain. Denies chest pain or SOB. Pain is in joint.  .   Objective: Vitals:   07/19/16 2020 07/20/16 0131 07/20/16 0900 07/20/16 0925  BP: (!) 155/87 (!) 143/88 (!) 151/89   Pulse: 78 71 65   Resp: 17 15 18    Temp: 98.1 F (36.7 C) 98.3 F (36.8 C) 98.1 F (36.7 C)   TempSrc: Oral Oral Oral   SpO2: 99% 99% 100% 100%    Intake/Output Summary (Last 24 hours) at 07/20/16 1144 Last data filed at 07/19/16 2249  Gross per 24 hour  Intake              220 ml  Output  0 ml  Net              220 ml   There were no vitals filed for this visit.   REVIEW OF SYSTEMS  As per history otherwise all reviewed and reported negative  Exam:  General exam: elderly chronically ill appearing male, lethargic but arousable.  Respiratory system: No increased work of breathing. Cardiovascular system: S1 & S2 heard Gastrointestinal system: Abdomen is nondistended, soft and  nontender. Normal bowel sounds heard. Central nervous system: strength deficit LUE 2-3/5 Extremities: no cyanosis.  Data Reviewed: Basic Metabolic Panel:  Recent Labs Lab 07/18/16 0344 07/18/16 0351 07/18/16 0757 07/19/16 0539 07/20/16 0346  NA 124* 128*  --  128* 128*  K 3.9 4.0  --  4.0 3.6  CL 93* 92*  --  96* 97*  CO2 24  --   --  22 23  GLUCOSE 111* 109*  --  96 117*  BUN 15 17  --  14 15  CREATININE 1.50* 1.60*  --  1.37* 1.36*  CALCIUM 8.5*  --   --  8.9 8.9  MG  --   --  1.6*  --  2.4  PHOS  --   --  3.8  --   --    Liver Function Tests:  Recent Labs Lab 07/18/16 0344 07/20/16 0346  AST 25 21  ALT 14* 13*  ALKPHOS 83 76  BILITOT 1.3* 1.7*  PROT 7.1 7.4  ALBUMIN 3.3* 3.4*   No results for input(s): LIPASE, AMYLASE in the last 168 hours. No results for input(s): AMMONIA in the last 168 hours. CBC:  Recent Labs Lab 07/18/16 0344 07/18/16 0351 07/19/16 0539 07/20/16 0346  WBC 7.3  --  7.0 7.9  NEUTROABS 5.3  --   --   --   HGB 10.2* 10.9* 11.2* 11.2*  HCT 30.6* 32.0* 33.0* 32.2*  MCV 90.8  --  91.2 90.4  PLT 144*  --  145* 136*   Cardiac Enzymes: No results for input(s): CKTOTAL, CKMB, CKMBINDEX, TROPONINI in the last 168 hours. CBG (last 3)   Recent Labs  07/18/16 0341  GLUCAP 115*   No results found for this or any previous visit (from the past 240 hour(s)).   Studies: No results found.  Scheduled Meds: . acetaminophen  650 mg Oral Q6H  . aspirin  300 mg Rectal Daily   Or  . aspirin  325 mg Oral Daily  . budesonide  0.5 mg Nebulization BID  . pantoprazole  40 mg Oral Q0600  . polyethylene glycol  17 g Oral Daily  . rivaroxaban  15 mg Oral Q supper   Continuous Infusions:  Principal Problem:   Stroke [facility]) Active Problems:   Hypertension   PVD (peripheral vascular disease) 3.5cm AAA Aug 2014   Hyperlipidemia   AAA (abdominal aortic aneurysm) without rupture (HCC)   Chronic systolic heart failure (HCC)   Paroxysmal atrial  fibrillation (HCC)   Hyponatremia   CKD (chronic kidney disease), stage III   Carotid artery disease (HCC)   COPD, group B, by GOLD 2017 classification (HCC)   Anemia   Non-English speaking patient  Time spent:   Standley Dakins, MD, FAAFP [facility] Pager [phone]  If 7PM-7AM, please contact night-coverage [email or web address] Password [email or web address] 07/20/2016, 11:44 AM    LOS: 2 days

## 2016-07-20 NOTE — Progress Notes (Signed)
STROKE TEAM PROGRESS NOTE   HISTORY OF PRESENT ILLNESS (per record) [name] is a 83 y.o. male with multiple medical problems including atrial fibrillation on Xarelto who presents with left-sided weakness and decreased responsiveness. He went to bed normal at 10 PM, as wife reports that when he woke up he was unable to lift "either arm." His brought into the emergency room where he was activated as code stroke. On my exam, he was able to hold his right arm up without difficulty but his left arm had significant weakness. He was taken for a stat CT perfusion which did not show any ischemic penumbra, and CT angiogram which shows right M1 stenosis without occlusion.  He will answer a few questions, but does follow commands.  LKW: 10 PM tpa given?: no, out of window   SUBJECTIVE (INTERVAL HISTORY) The patient does not speaking [place]  . His son is present and offers to translate. The patient is feeling somewhat better.   OBJECTIVE Temp:  [98.1 F (36.7 C)-98.8 F (37.1 C)] 98.8 F (37.1 C) (06/10 1300) Pulse Rate:  [65-84] 84 (06/10 1300) Cardiac Rhythm: Atrial fibrillation (06/10 1200) Resp:  [15-20] 17 (06/10 1300) BP: (130-176)/(84-94) 130/84 (06/10 1300) SpO2:  [97 %-100 %] 97 % (06/10 1300)  CBC:  Recent Labs Lab 07/18/16 0344  07/19/16 0539 07/20/16 0346  WBC 7.3  --  7.0 7.9  NEUTROABS 5.3  --   --   --   HGB 10.2*  < > 11.2* 11.2*  HCT 30.6*  < > 33.0* 32.2*  MCV 90.8  --  91.2 90.4  PLT 144*  --  145* 136*  < > = values in this interval not displayed.  Basic Metabolic Panel:   Recent Labs Lab 07/18/16 0757 07/19/16 0539 07/20/16 0346  NA  --  128* 128*  K  --  4.0 3.6  CL  --  96* 97*  CO2  --  22 23  GLUCOSE  --  96 117*  BUN  --  14 15  CREATININE  --  1.37* 1.36*  CALCIUM  --  8.9 8.9  MG 1.6*  --  2.4  PHOS 3.8  --   --     Lipid Panel:     Component Value Date/Time   CHOL 100 07/18/2016 0757   CHOL 122 09/09/2013 0815   TRIG 138  07/18/2016 0757   HDL 30 (L) 07/18/2016 0757   HDL 35 (L) 09/09/2013 0815   CHOLHDL 3.3 07/18/2016 0757   VLDL 28 07/18/2016 0757   LDLCALC 42 07/18/2016 0757   LDLCALC 50 09/09/2013 0815   HgbA1c:  Lab Results  Component Value Date   HGBA1C 6.4 (H) 07/18/2016   Urine Drug Screen: No results found for: LABOPIA, COCAINSCRNUR, LABBENZ, AMPHETMU, THCU, LABBARB  Alcohol Level No results found for: ETH  IMAGING  Ct Angio Head W Or Wo Contrast Ct Angio Neck W Or Wo Contrast Ct Cerebral Perfusion W Contrast 07/18/2016 1. Negative CTA for emergent large vessel occlusion.  2. Negative CT perfusion. No evidence for acute infarct or perfusion mismatch.  3. Moderate to severe diffuse narrowing of the mid-distal right M1 segment to approximately 75%. This is presumably chronic in nature given the lack of perfusion mismatch.  4. Patent left carotid artery stent. Moderate narrowing at the distal aspect of the stent to approximately 70%  5. Prominent atheromatous disease involving the visualized aortic arch, with additional mild to moderate multifocal atherosclerotic changes as above.  Mr Brain Wo Contrast 07/18/2016 Small region of acute infarction affecting the gyral surface of the precentral gyrus on the right. Extensive chronic small vessel ischemic changes as outlined above, with old hemosiderin deposition in the right lateral thalamus/posterior limb internal capsule and left frontal cortical/subcortical brain. Small old cortical infarctions in the right posterior frontal, left posterior frontal, and left parietal regions.   Ct Head Code Stroke W/o Cm 07/18/2016 1. No acute intracranial infarct or other process identified.  2. ASPECTS is 10  3. Moderate cerebral atrophy with chronic microvascular ischemic disease and scattered remote infarcts as above.    PHYSICAL EXAM Pleasant elderly asian male not in distress. . Afebrile. Head is nontraumatic. Neck is supple without bruit.    Cardiac  exam no murmur or gallop. Lungs are clear to auscultation. Distal pulses are well felt.partially amputed right index finger  Neurological Exam ; limited due to language barrier.Awake alert oriented x 3.No dysarthria. . Mild left lower face asymmetry. Tongue midline. LUE drift.   diminished fine finger movements on left. Orbits right over left upper extremity. Significant left grip weak..Mild left hip flexors weak Normal sensation . Normal coordination.     ASSESSMENT/PLAN [name] is a 83 y.o. male with history of paroxysmal atrial fibrillation on (Xarelto and aspirin 81mg  prior to admission),  hypertension, coronary artery disease with previous MI, left ventricular dysfunction, history of congestive heart failure, chronic kidney disease, COPD, AAA, left carotid stent 2015 Dr Allyson Sabal, peripheral vascular disease, previous stroke, and hyperlipidemia, presenting with left-sided weakness. He did not receive IV t-PA due to late presentation and anticoagulation.  Stroke: acute infarction precentral gyrus on the right felt to be embolic secondary to atrial fibrillation.  Resultant  Left hemiplegia  CT head - No acute intracranial infarct or other process identified.   MRI head - small region of acute infarction affecting the gyral surface of the precentral gyrus on the right.  MRA head - not performed  Carotid Doppler - CTA neck  CTA H&N - Rt M1 segment approximately 75%. Distal aspect of the left internal carotid artery stent 70%.  2D Echo - EF 45-50%. Moderate pulmonary hypertension. No cardiac source of emboli identified.  LDL - 42  HgbA1c - 6.4  VTE prophylaxis - Xarelto DIET DYS 2 Room service appropriate? Yes; Fluid consistency: Thin  aspirin 81 mg daily and Xarelto (rivaroxaban) daily prior to admission, now on aspirin 325 mg daily and Xarelto (rivaroxaban) daily  Patient counseled to be compliant with his antithrombotic medications  Ongoing aggressive stroke risk  factor management  Therapy recommendations: pending  Disposition: Pending  Hypertension  Stable  Permissive hypertension (OK if < 220/120) but gradually normalize in 5-7 days  Long-term BP goal normotensive  Hyperlipidemia  Home meds: Lipitor 40 mg daily resumed in hospital  LDL 42, goal < 70  Continue statin at discharge   Other Stroke Risk Factors  Advanced age  Former cigarette smoker. The patient quit smoking 14 years ago.  Hx stroke/TIA  Coronary artery disease  Paroxysmal atrial fibrillation - on Xarelto and aspirin 81 mg   Other Active Problems  Hx of Elliquis allergy  Mild anemia - mild thrombocytopenia  Hyponatremia - 128  Creatinine - 1.36  PLAN  Dr. Pearlean Brownie discussed the patient's anticoagulation with the patient's daughter. She confirms that the patient is compliant with his Xarelto which she takes daily with food. He has a reported allergy to Elliquis. The patient's daughter would like to change Xarelto to Pradaxa  for further stroke prophylaxis. Continue aspirin 81 mg daily for coronary artery disease. Will consult pharmacy for conversion.   Delton See PA-C [facility] Pager [phone] 07/20/2016, 4:40 PM   Hospital day # 2  I have personally examined this patient, reviewed notes, independently viewed imaging studies, participated in medical decision making and plan of care.ROS completed by me personally and pertinent positives fully documented  I have made any additions or clarifications directly to the above note. Agree with note above.Long  Discussion with patient, daughter and son-in-law about alternatives to Xarelto for atrial fibrillation treatment and uncertain questions. Recommend change to protect the. Continue ongoing stroke workup. Greater than 50% time during this 35 minute visit was spent on counseling and coordination of care about his embolic stroke, atrial fibrillation, discussion about treatment options and  answering questions. Delia Heady, MD Medical Director [facility] Pager: [phone] 07/20/2016 5:25 PM  Delia Heady, MD Medical Director [facility] Pager: [phone] 07/20/2016 5:24 PM   To contact Stroke Continuity provider, please refer to [email or web address]. After hours, contact General Neurology

## 2016-07-21 DIAGNOSIS — I6300 Cerebral infarction due to thrombosis of unspecified precerebral artery: Secondary | ICD-10-CM

## 2016-07-21 DIAGNOSIS — D649 Anemia, unspecified: Secondary | ICD-10-CM

## 2016-07-21 MED ORDER — POLYETHYLENE GLYCOL 3350 17 G PO PACK: 17.0000 g | PACK | Freq: Every day | ORAL | 0 refills | Status: DC | PRN

## 2016-07-21 MED ORDER — ESOMEPRAZOLE MAGNESIUM 40 MG PO CPDR: 40.0000 mg | DELAYED_RELEASE_CAPSULE | Freq: Two times a day (BID) | ORAL | Status: DC

## 2016-07-21 MED ORDER — DABIGATRAN ETEXILATE MESYLATE 75 MG PO CAPS: 75.0000 mg | ORAL_CAPSULE | Freq: Two times a day (BID) | ORAL | 0 refills | Status: DC

## 2016-07-21 MED ORDER — ASPIRIN EC 81 MG PO TBEC
81.0000 mg | DELAYED_RELEASE_TABLET | Freq: Every day | ORAL | Status: DC
  Administered 2016-07-21: 81 mg via ORAL
  Filled 2016-07-21: qty 1

## 2016-07-21 NOTE — Progress Notes (Signed)
STROKE TEAM PROGRESS NOTE   HISTORY OF PRESENT ILLNESS (per record) [name] is a 83 y.o. male with multiple medical problems including atrial fibrillation on Xarelto who presents with left-sided weakness and decreased responsiveness. He went to bed normal at 10 PM, as wife reports that when he woke up he was unable to lift "either arm." His brought into the emergency room where he was activated as code stroke. On my exam, he was able to hold his right arm up without difficulty but his left arm had significant weakness. He was taken for a stat CT perfusion which did not show any ischemic penumbra, and CT angiogram which shows right M1 stenosis without occlusion.  He will answer a few questions, but does follow commands.  LKW: 10 PM tpa given?: no, out of window   SUBJECTIVE (INTERVAL HISTORY) The patient does not speaking [place]  . His  daughter is present and offers to translate. The patient is feeling somewhat better.   OBJECTIVE Temp:  [97.5 F (36.4 C)-98.8 F (37.1 C)] 98.3 F (36.8 C) (06/11 1016) Pulse Rate:  [60-84] 75 (06/11 1016) Cardiac Rhythm: Atrial fibrillation (06/11 0704) Resp:  [16-17] 16 (06/11 1016) BP: (115-146)/(63-84) 123/70 (06/11 1016) SpO2:  [97 %-100 %] 99 % (06/11 1016)  CBC:  Recent Labs Lab 07/18/16 0344  07/19/16 0539 07/20/16 0346  WBC 7.3  --  7.0 7.9  NEUTROABS 5.3  --   --   --   HGB 10.2*  < > 11.2* 11.2*  HCT 30.6*  < > 33.0* 32.2*  MCV 90.8  --  91.2 90.4  PLT 144*  --  145* 136*  < > = values in this interval not displayed.  Basic Metabolic Panel:   Recent Labs Lab 07/18/16 0757 07/19/16 0539 07/20/16 0346  NA  --  128* 128*  K  --  4.0 3.6  CL  --  96* 97*  CO2  --  22 23  GLUCOSE  --  96 117*  BUN  --  14 15  CREATININE  --  1.37* 1.36*  CALCIUM  --  8.9 8.9  MG 1.6*  --  2.4  PHOS 3.8  --   --     Lipid Panel:     Component Value Date/Time   CHOL 100 07/18/2016 0757   CHOL 122 09/09/2013 0815   TRIG  138 07/18/2016 0757   HDL 30 (L) 07/18/2016 0757   HDL 35 (L) 09/09/2013 0815   CHOLHDL 3.3 07/18/2016 0757   VLDL 28 07/18/2016 0757   LDLCALC 42 07/18/2016 0757   LDLCALC 50 09/09/2013 0815   HgbA1c:  Lab Results  Component Value Date   HGBA1C 6.4 (H) 07/18/2016   Urine Drug Screen: No results found for: LABOPIA, COCAINSCRNUR, LABBENZ, AMPHETMU, THCU, LABBARB  Alcohol Level No results found for: ETH  IMAGING  Ct Angio Head W Or Wo Contrast Ct Angio Neck W Or Wo Contrast Ct Cerebral Perfusion W Contrast 07/18/2016 1. Negative CTA for emergent large vessel occlusion.  2. Negative CT perfusion. No evidence for acute infarct or perfusion mismatch.  3. Moderate to severe diffuse narrowing of the mid-distal right M1 segment to approximately 75%. This is presumably chronic in nature given the lack of perfusion mismatch.  4. Patent left carotid artery stent. Moderate narrowing at the distal aspect of the stent to approximately 70%  5. Prominent atheromatous disease involving the visualized aortic arch, with additional mild to moderate multifocal atherosclerotic changes as above.  Mr Brain Wo Contrast 07/18/2016 Small region of acute infarction affecting the gyral surface of the precentral gyrus on the right. Extensive chronic small vessel ischemic changes as outlined above, with old hemosiderin deposition in the right lateral thalamus/posterior limb internal capsule and left frontal cortical/subcortical brain. Small old cortical infarctions in the right posterior frontal, left posterior frontal, and left parietal regions.   Ct Head Code Stroke W/o Cm 07/18/2016 1. No acute intracranial infarct or other process identified.  2. ASPECTS is 10  3. Moderate cerebral atrophy with chronic microvascular ischemic disease and scattered remote infarcts as above.    PHYSICAL EXAM Pleasant elderly asian male not in distress. . Afebrile. Head is nontraumatic. Neck is supple without bruit.     Cardiac exam no murmur or gallop. Lungs are clear to auscultation. Distal pulses are well felt.partially amputed right index finger  Neurological Exam ; limited due to language barrier.Awake alert oriented x 3.No dysarthria. . Mild left lower face asymmetry. Tongue midline. LUE drift.   diminished fine finger movements on left. Orbits right over left upper extremity. Significant left grip weak..Mild left hip flexors weak Normal sensation . Normal coordination.     ASSESSMENT/PLAN [name] is a 83 y.o. male with history of paroxysmal atrial fibrillation on (Xarelto and aspirin 81mg  prior to admission),  hypertension, coronary artery disease with previous MI, left ventricular dysfunction, history of congestive heart failure, chronic kidney disease, COPD, AAA, left carotid stent 2015 Dr Allyson Sabal, peripheral vascular disease, previous stroke, and hyperlipidemia, presenting with left-sided weakness. He did not receive IV t-PA due to late presentation and anticoagulation.  Stroke: acute infarction precentral gyrus on the right felt to be embolic secondary to atrial fibrillation.  Resultant  Left hemiplegia  CT head - No acute intracranial infarct or other process identified.   MRI head - small region of acute infarction affecting the gyral surface of the precentral gyrus on the right.  MRA head - not performed  Carotid Doppler - CTA neck  CTA H&N - Rt M1 segment approximately 75%. Distal aspect of the left internal carotid artery stent 70%.  2D Echo - EF 45-50%. Moderate pulmonary hypertension. No cardiac source of emboli identified.  LDL - 42  HgbA1c - 6.4  VTE prophylaxis - Xarelto DIET DYS 2 Room service appropriate? Yes; Fluid consistency: Thin  aspirin 81 mg daily and Xarelto (rivaroxaban) daily prior to admission, now on aspirin 325 mg daily and Xarelto (rivaroxaban) daily  Patient counseled to be compliant with his antithrombotic medications  Ongoing aggressive stroke  risk factor management  Therapy recommendations: pending  Disposition: Pending  Hypertension  Stable  Permissive hypertension (OK if < 220/120) but gradually normalize in 5-7 days  Long-term BP goal normotensive  Hyperlipidemia  Home meds: Lipitor 40 mg daily resumed in hospital  LDL 42, goal < 70  Continue statin at discharge   Other Stroke Risk Factors  Advanced age  Former cigarette smoker. The patient quit smoking 14 years ago.  Hx stroke/TIA  Coronary artery disease  Paroxysmal atrial fibrillation - on Xarelto and aspirin 81 mg   Other Active Problems  Hx of Elliquis allergy  Mild anemia - mild thrombocytopenia  Hyponatremia - 128  Creatinine - 1.36  PLAN I discussed the patient's anticoagulation with the patient's daughter. She  Agrees with   change Xarelto to Pradaxa for further stroke prophylaxis. Continue aspirin 81 mg daily for coronary artery disease. Follow-up as an outpatient in stroke clinic in 6 weeks. Stroke team  will sign off. Kindly call for questions      Hospital day # 3  I  . Delia Heady, MD Medical Director [facility] Pager: [phone] 07/21/2016 12:49 PM      To contact Stroke Continuity provider, please refer to [email or web address]. After hours, contact General Neurology

## 2016-07-21 NOTE — Progress Notes (Signed)
Pt being discharged from hospital per orders from MD. Pt and family educated on discharge instructions. Pt and family verbalized understanding of discharge instructions. All questions and concerns were addressed. Pt's IV was removed prior to discharge. Pt exited hospital via wheelchair.

## 2016-07-21 NOTE — Discharge Summary (Addendum)
Physician Discharge Summary  [name] WUJ:[id number] DOB: Jun 26, 1926 DOA: 07/18/2016  PCP: [name], MD  Admit date: 07/18/2016 Discharge date: 07/21/2016  Admitted From: Home  Disposition: Home with [facility] services Recommendations for Outpatient Follow-up:  1. Follow up with PCP in 1 weeks 2. Followup with neurology stroke clinic in 6 weeks 3. Please Check CBC, CMP in 1-2 weeks  Home Health: YES PT/OT/RN/SW/AIDE Discharge Condition:stable  CODE STATUS:FULL    Brief/Interim Summary: HPI: [name] is a 83 y.o. male with medical history significant for A. fib on Xarelto, CVA, CHF, chronic kidney disease stage III, COPD, anemia, CAD, hypertension, hyponatremia, presents to the emergency Department chief complaint left-sided weakness. Code stroke was activated he was evaluated by neurology who recommended admission for workup.  Information is obtained from the chart and the wife was at the bedside noting moderate language barrier. Patient was last seen normal at 10:00 last night when he awakened this morning wife reports he could not move his arms. In addition he was unable to speak and had an unsteady gait.He then became unresponsive. Family denies any recent illness fall trauma. No reports of difficulty chewing or swallowing complaints of numbness or tingling of his extremities. They reported he's had strokes in the past and has been left with right arm weakness as result. No complaints of chest pain palpitation shortness of breath. No abdominal pain nausea vomiting diarrhea.   ED Course: Code stroke was called he was sent for head CT and evaluated by neurology. Dr. Amada Jupiter from neurology ordered perfusion scan and CT NGO of the head and neck which revealed no obvious stroke but showed a potential narrowing of the right MCA. In the emergency department blood pressures a high end of normal, he is afebrile and not hypoxic. #1. Acute CVAs. History of same. Risk factors  includehypertension hyperlipidemia A. Fib.Perfusion scan and CT angiogram of the head and neck revealing no obvious stroke. Evaluated by neurology who opined with right MCA stenosis concern for artery to artery embolus. -Admit to telemetry -MRI-Small region of acute infarction affecting the gyral surface of the precentral gyrus on the right. -Frequent neuro checks -Echocardiogram:The right ventricular systolic pressure was increased consistentwith moderate pulmonary hypertension -PT and OT recommending SNF.  Unsure if family will agree to this, consulted Child psychotherapist.  -Speech therapy placed him on dysphagia 2 diet.  -Aspirin  -Lipid panel optimal,   a1c 6.4% -neurology started on pradaxa, DC xarelto.   #2. Chronic Hyponatremia. Sodium level 128. Chart review indicates he has a history of same. 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 Chronic kidney disease. Stage III. Dressing 1.6 on admission. This appears to be close to baseline -Monitor urine output  #4. Hypertension. Blood pressure high end of normal in the emergency department. Home medications include Coreg  -permissive hypertension  -Monitor -Hydralazine with parameters as needed  #5. A. Fib. [place] score 5. On pradaxa  -Follow EKG -Pradaxa  #6. Chronic systolic heart failure. Last echo was August 2017. EF was 45% with recent echo -Holding beta blocker for now -Daily weights -Intake and output -Echocardiogram as noted above  #7. COPD. Not on home oxygen. Appears stable at baseline -Continue home meds  #8. Anemia. Hemoglobin 10.9. Chart review indicates this is close to his baseline. No signs symptoms of active bleeding. Home medications to include Xarelto -FOBT -Anemia panel -Outpatient follow-up  #9. Left shoulder pain - Pt has  OA in the joint and occasional flare ups of pain, troponin was 0.00. No chest pain or  SOB. Will schedule tylenol to be given regularly around the clock to control inflammation and pain, escalate pain medication if this is not working to control symptoms.   Stroke Team  IMAGING  Ct Angio Head W Or Wo Contrast Ct Angio Neck W Or Wo Contrast Ct Cerebral Perfusion W Contrast 07/18/2016 1. Negative CTA for emergent large vessel occlusion.  2. Negative CT perfusion. No evidence for acute infarct or perfusion mismatch.  3. Moderate to severe diffuse narrowing of the mid-distal right M1 segment to approximately 75%. This is presumably chronic in nature given the lack of perfusion mismatch.  4. Patent left carotid artery stent. Moderate narrowing at the distal aspect of the stent to approximately 70%  5. Prominent atheromatous disease involving the visualized aortic arch, with additional mild to moderate multifocal atherosclerotic changes as above.    Mr Brain Wo Contrast 07/18/2016 Small region of acute infarction affecting the gyral surface of the precentral gyrus on the right. Extensive chronic small vessel ischemic changes as outlined above, with old hemosiderin deposition in the right lateral thalamus/posterior limb internal capsule and left frontal cortical/subcortical brain. Small old cortical infarctions in the right posterior frontal, left posterior frontal, and left parietal regions.   Ct Head Code Stroke W/o Cm 07/18/2016 1. No acute intracranial infarct or other process identified.  2. ASPECTS is 10  3. Moderate cerebral atrophy with chronic microvascular ischemic disease and scattered remote infarcts as above.    PHYSICAL EXAM Pleasant elderly asian male not in distress. . Afebrile. Head is nontraumatic. Neck is supple without bruit.    Cardiac exam no murmur or gallop. Lungs are clear to auscultation. Distal pulses are well felt.partially amputed right index finger  Neurological Exam ; limited due to language barrier.Awake alert oriented x 3.No dysarthria. .  Mild left lower face asymmetry. Tongue midline. LUE drift.   diminished fine finger movements on left. Orbits right over left upper extremity. Significant left grip weak..Mild left hip flexors weak Normal sensation . Normal coordination.  ASSESSMENT/PLAN [name] is a 83 y.o. male with history of paroxysmal atrial fibrillation on (Xarelto and aspirin 81mg  prior to admission),  hypertension, coronary artery disease with previous MI, left ventricular dysfunction, history of congestive heart failure, chronic kidney disease, COPD, AAA, left carotid stent 2015 Dr Allyson Sabal, peripheral vascular disease, previous stroke, and hyperlipidemia, presenting with left-sided weakness. He did not receive IV t-PA due to late presentation and anticoagulation.  Stroke: acute infarction precentral gyrus on the right felt to be embolic secondary to atrial fibrillation.  Resultant  Left hemiplegia  CT head - No acute intracranial infarct or other process identified.   MRI head - small region of acute infarction affecting the gyral surface of the precentral gyrus on the right.  MRA head - not performed  Carotid Doppler - CTA neck  CTA H&N - Rt M1 segment approximately 75%. Distal aspect of the left internal carotid artery stent 70%.  2D Echo - EF 45-50%. Moderate pulmonary hypertension. No cardiac source of emboli identified.  LDL - 42  HgbA1c - 6.4  VTE prophylaxis - Pradaxa  DIET DYS 2 Room service appropriate? Yes; Fluid consistency: Thin  aspirin 81 mg daily and Xarelto (rivaroxaban) daily prior to admission, now on aspirin and pradaxa daily  Patient counseled to be compliant with his antithrombotic medications  Ongoing aggressive stroke risk factor management  Therapy recommendations: HHPT  Disposition: Home   Hypertension  Stable              Permissive hypertension (OK if < 220/120) but gradually normalize in 5-7 days              Long-term BP goal  normotensive  Hyperlipidemia  Home meds: Lipitor 40 mg daily resumed in hospital  LDL 42, goal < 70  Continue statin at discharge   Other Stroke Risk Factors  Advanced age  Former cigarette smoker. The patient quit smoking 14 years ago.  Hx stroke/TIA  Coronary artery disease  Paroxysmal atrial fibrillation - on Xarelto and aspirin 81 mg   Other Active Problems  Hx of Elliquis allergy  Mild anemia - mild thrombocytopenia  Hyponatremia - 128  Creatinine - 1.36  PLAN I discussed the patient's anticoagulation with the patient's daughter. She  Agrees with   change Xarelto to Pradaxa for further stroke prophylaxis. Continue aspirin 81 mg daily for coronary artery disease. Follow-up as an outpatient in stroke clinic in 6 weeks. Stroke team will sign off. Kindly call for questions  DVT prophylaxis:pradaxa Code Status:full Family Communication:son (interpreter) and wife Disposition Plan:family decided on home vs snf Consults called:neurology Admission status:inpatient   Discharge Diagnoses:  Principal Problem:   Stroke [facility]) Active Problems:   Hypertension   PVD (peripheral vascular disease) 3.5cm AAA Aug 2014   Hyperlipidemia   AAA (abdominal aortic aneurysm) without rupture (HCC)   Chronic systolic heart failure (HCC)   Paroxysmal atrial fibrillation (HCC)   Hyponatremia   CKD (chronic kidney disease), stage III   Carotid artery disease (HCC)   COPD, group B, by GOLD 2017 classification (HCC)   Anemia   Non-English speaking patient    Discharge Instructions  Discharge Instructions    Increase activity slowly    Complete by:  As directed      Allergies as of 07/21/2016      Reactions   Levofloxacin Anaphylaxis, Other (See Comments)   Patient told his daughter that it made him "feel worse than he already did" (overall) Headache also (has refused to take anymore)   Eliquis [apixaban] Itching, Swelling   Phenazopyridine Hcl Other (See  Comments)   Unknown reaction   Septra [sulfamethoxazole-trimethoprim] Nausea And Vomiting   GI Upset   Latex Rash   Tape Rash      Medication List    STOP taking these medications   nystatin 100000 UNIT/ML suspension Commonly known as:  MYCOSTATIN   Rivaroxaban 15 MG Tabs tablet Commonly known as:  XARELTO     TAKE these medications   allopurinol 300 MG tablet Commonly known as:  ZYLOPRIM Take 300 mg by mouth daily. To prevent gout   arformoterol 15 MCG/2ML Nebu Commonly known as:  BROVANA Take 2 mLs (15 mcg total) by nebulization 2 (two) times daily.   aspirin EC 81 MG tablet Take 1 tablet (81 mg total) by mouth daily.   atorvastatin 40 MG tablet Commonly known as:  LIPITOR Take 1 tablet (40 mg total) by mouth daily at 6 PM.   budesonide 0.5 MG/2ML nebulizer solution Commonly known as:  PULMICORT Take 2 mLs (0.5 mg total) by nebulization 2 (two) times daily.   carvedilol 3.125 MG tablet Commonly known as:  COREG Take 1 tablet (3.125 mg total) by mouth 2 (two) times daily with a meal.   dabigatran 75 MG Caps capsule Commonly known as:  PRADAXA Take 1 capsule (75 mg total) by mouth every 12 (twelve) hours.  esomeprazole 40 MG capsule Commonly known as:  NEXIUM Take 1 capsule (40 mg total) by mouth 2 (two) times daily before a meal.   isosorbide mononitrate 30 MG 24 hr tablet Commonly known as:  IMDUR Take 30 mg by mouth once daily What changed:  how much to take  how to take this  when to take this  additional instructions   NITROSTAT 0.4 MG SL tablet Generic drug:  nitroGLYCERIN PLACE 1 TABLET UNDER THE TONGUE EVERY 5 MINUTES FOR 3 DOSES AS NEEDED FOR CHEST PAIN   polyethylene glycol packet Commonly known as:  MIRALAX / GLYCOLAX Take 17 g by mouth daily as needed.   tamsulosin 0.4 MG Caps capsule Commonly known as:  FLOMAX Take 0.4 mg by mouth daily. To improve bladder function      Follow-up Information    [name], MD Follow up in  1 week(s).   Specialty:  Family Medicine Contact information: [address] [place] [address]        Micki Riley, MD. Schedule an appointment as soon as possible for a visit in 6 week(s).   Specialties:  Neurology, Radiology Why:  Hospital Follow Up  Contact information: [address] [place] [address] [phone]        Runell Gess, MD. Go on 07/23/2016.   Specialties:  Cardiology, Radiology Contact information: [address] [place] [address] [phone]        Chilton Greathouse, MD. Go on 09/08/2016.   Specialty:  Pulmonary Disease Contact information: [address] [place] [address] [phone]          Allergies  Allergen Reactions  . Levofloxacin Anaphylaxis and Other (See Comments)    Patient told his daughter that it made him "feel worse than he already did" (overall) Headache also (has refused to take anymore)  . Eliquis [Apixaban] Itching and Swelling  . Phenazopyridine Hcl Other (See Comments)    Unknown reaction  . Septra [Sulfamethoxazole-Trimethoprim] Nausea And Vomiting    GI Upset  . Latex Rash  . Tape Rash     Procedures/Studies: Ct Angio Head W Or Wo Contrast  Result Date: 07/18/2016 CLINICAL DATA:  Initial evaluation for acute aphasia with right-sided weakness. EXAM: CT ANGIOGRAPHY HEAD AND NECK CT PERFUSION BRAIN TECHNIQUE: Multidetector CT imaging of the head and neck was performed using the standard protocol during bolus administration of intravenous contrast. Multiplanar CT image reconstructions and MIPs were obtained to evaluate the vascular anatomy. Carotid stenosis measurements (when applicable) are obtained utilizing NASCET criteria, using the distal internal carotid diameter as the denominator. Multiphase CT imaging of the brain was performed following IV bolus contrast injection. Subsequent parametric perfusion maps were calculated using RAPID software. CONTRAST:  90  cc of Isovue 370. COMPARISON:  Prior noncontrast head CT from earlier the same day. FINDINGS: CTA NECK FINDINGS Aortic arch: Visualized aortic arch of normal caliber with normal branch pattern. The head be calcified noncalcified plaque within the arch itself with few small focal penetrating atheromatous plaques (series 9, image 319). No high-grade stenosis about the origin of the great vessels. Visualized subclavian artery is patent without stenosis. Right carotid system: Right common carotid artery patent from its origin to the bifurcation without stenosis. Calcified noncalcified plaque about the right bifurcation/proximal right ICA without flow-limiting stenosis. Right ICA widely patent distally to the skullbase without stenosis, dissection, or occlusion. Left carotid system: Left common carotid artery patent from its  origin to the bifurcation. Carotid artery's stent present at the left bifurcation extending into the proximal left ICA and. There is patent flow through the stent, although there is moderate narrowing at the distal aspect of the stent by approximately 70% (series 9, image 200). Left ICA widely patent distally to the skullbase without stenosis, dissection, or occlusion. Vertebral arteries: Both of the vertebral arteries arise from the subclavian arteries. Left vertebral artery dominant. Focal moderate narrowing of the left AV 2 segment at the level of C3 related to extrinsic compression from degenerative changes (series 9, image 202). Vertebral artery is otherwise patent within the neck without stenosis, dissection, or occlusion. Skeleton: No acute osseus abnormality. No worrisome lytic or blastic osseous lesions. Moderate to advanced multilevel degenerative spondylolysis noted within cervical spine. Other neck: No acute soft tissue abnormality within the neck. No adenopathy. Thyroid normal. Upper chest: Visualized upper chest within normal limits. Partially visualized lungs are clear. Review of the  MIP images confirms the above findings CTA HEAD FINDINGS Anterior circulation: The petrous segments patent bilaterally without stenosis. Multifocal atheromatous plaque within the cavernous/ supraclinoid ICAs with mild to moderate multifocal narrowing. No high-grade stenosis. ICA termini widely patent. Left A1 segment dominant and patent. Right A1 segment slightly diminutive but patent as well. Anterior cerebral artery is widely patent to their distal aspects. Left M1 segment mildly irregular but patent to its distal aspect without significant stenosis. No proximal left M2 occlusion. Distal left MCA branches widely patent. Left M1 segment widely patent at its origin. The right M1 smoothly taper is at its mid and distal aspect with up twos 75% stenosis at its distal aspect. No filling defect to suggest intraluminal thrombus or other significant intimal irregularity to suggest that this is acute in nature. Stenosis extends into the anterior right temporal branch. Posterior branches attenuated and small as well. No proximal M2 occlusion. Distal right MCA branches well opacified and symmetric with the left. Posterior circulation: Scattered plaque present within the dominant left V4 segment without flow-limiting stenosis. Hypoplastic right vertebral artery divides at the takeoff of the right PICA, with a small ascending branch ascending towards the vertebrobasilar junction. Right PICA patent. Left PICA not visualized. Basilar artery demonstrates mild multifocal atheromatous irregularity but is widely patent to its distal aspect. Superior cerebral arteries patent bilaterally. Right PCA largely supplied via the basilar artery and is widely patent to its distal aspect. Small right posterior communicating artery noted as well. Predominant fetal type left PCA supplied via a widely patent left posterior communicating artery. Left PCA patent to its distal aspect. Venous sinuses: Patent. Anatomic variants: No significant anatomic  variant. No aneurysm or vascular malformation. Delayed phase: Not performed. Review of the MIP images confirms the above findings CT Brain Perfusion Findings: CBF (<30%) Volume: 0mL Perfusion (Tmax>6.0s) volume: 0mL Mismatch Volume: 0mL Infarction Location: IMPRESSION: 1. Negative CTA for emergent large vessel occlusion. 2. Negative CT perfusion. No evidence for acute infarct or perfusion mismatch. 3. Moderate to severe diffuse narrowing of the mid-distal right M1 segment to approximately 75%. This is presumably chronic in nature given the lack of perfusion mismatch. 4. Patent left carotid artery stent. Moderate narrowing at the distal aspect of the stent to approximately 70% 5. Prominent atheromatous disease involving the visualized aortic arch, with additional mild to moderate multifocal atherosclerotic changes as above. Results were called by telephone at the time of interpretation on 07/18/2016 at 4:38 am to Dr. Ritta Slot , who verbally acknowledged these results. Electronically Signed  By: Rise Mu M.D.   On: 07/18/2016 05:12   Ct Angio Neck W Or Wo Contrast  Result Date: 07/18/2016 CLINICAL DATA:  Initial evaluation for acute aphasia with right-sided weakness. EXAM: CT ANGIOGRAPHY HEAD AND NECK CT PERFUSION BRAIN TECHNIQUE: Multidetector CT imaging of the head and neck was performed using the standard protocol during bolus administration of intravenous contrast. Multiplanar CT image reconstructions and MIPs were obtained to evaluate the vascular anatomy. Carotid stenosis measurements (when applicable) are obtained utilizing NASCET criteria, using the distal internal carotid diameter as the denominator. Multiphase CT imaging of the brain was performed following IV bolus contrast injection. Subsequent parametric perfusion maps were calculated using RAPID software. CONTRAST:  90 cc of Isovue 370. COMPARISON:  Prior noncontrast head CT from earlier the same day. FINDINGS: CTA NECK FINDINGS  Aortic arch: Visualized aortic arch of normal caliber with normal branch pattern. The head be calcified noncalcified plaque within the arch itself with few small focal penetrating atheromatous plaques (series 9, image 319). No high-grade stenosis about the origin of the great vessels. Visualized subclavian artery is patent without stenosis. Right carotid system: Right common carotid artery patent from its origin to the bifurcation without stenosis. Calcified noncalcified plaque about the right bifurcation/proximal right ICA without flow-limiting stenosis. Right ICA widely patent distally to the skullbase without stenosis, dissection, or occlusion. Left carotid system: Left common carotid artery patent from its origin to the bifurcation. Carotid artery's stent present at the left bifurcation extending into the proximal left ICA and. There is patent flow through the stent, although there is moderate narrowing at the distal aspect of the stent by approximately 70% (series 9, image 200). Left ICA widely patent distally to the skullbase without stenosis, dissection, or occlusion. Vertebral arteries: Both of the vertebral arteries arise from the subclavian arteries. Left vertebral artery dominant. Focal moderate narrowing of the left AV 2 segment at the level of C3 related to extrinsic compression from degenerative changes (series 9, image 202). Vertebral artery is otherwise patent within the neck without stenosis, dissection, or occlusion. Skeleton: No acute osseus abnormality. No worrisome lytic or blastic osseous lesions. Moderate to advanced multilevel degenerative spondylolysis noted within cervical spine. Other neck: No acute soft tissue abnormality within the neck. No adenopathy. Thyroid normal. Upper chest: Visualized upper chest within normal limits. Partially visualized lungs are clear. Review of the MIP images confirms the above findings CTA HEAD FINDINGS Anterior circulation: The petrous segments patent  bilaterally without stenosis. Multifocal atheromatous plaque within the cavernous/ supraclinoid ICAs with mild to moderate multifocal narrowing. No high-grade stenosis. ICA termini widely patent. Left A1 segment dominant and patent. Right A1 segment slightly diminutive but patent as well. Anterior cerebral artery is widely patent to their distal aspects. Left M1 segment mildly irregular but patent to its distal aspect without significant stenosis. No proximal left M2 occlusion. Distal left MCA branches widely patent. Left M1 segment widely patent at its origin. The right M1 smoothly taper is at its mid and distal aspect with up twos 75% stenosis at its distal aspect. No filling defect to suggest intraluminal thrombus or other significant intimal irregularity to suggest that this is acute in nature. Stenosis extends into the anterior right temporal branch. Posterior branches attenuated and small as well. No proximal M2 occlusion. Distal right MCA branches well opacified and symmetric with the left. Posterior circulation: Scattered plaque present within the dominant left V4 segment without flow-limiting stenosis. Hypoplastic right vertebral artery divides at the takeoff of the right  PICA, with a small ascending branch ascending towards the vertebrobasilar junction. Right PICA patent. Left PICA not visualized. Basilar artery demonstrates mild multifocal atheromatous irregularity but is widely patent to its distal aspect. Superior cerebral arteries patent bilaterally. Right PCA largely supplied via the basilar artery and is widely patent to its distal aspect. Small right posterior communicating artery noted as well. Predominant fetal type left PCA supplied via a widely patent left posterior communicating artery. Left PCA patent to its distal aspect. Venous sinuses: Patent. Anatomic variants: No significant anatomic variant. No aneurysm or vascular malformation. Delayed phase: Not performed. Review of the MIP images  confirms the above findings CT Brain Perfusion Findings: CBF (<30%) Volume: 31mL Perfusion (Tmax>6.0s) volume: 28mL Mismatch Volume: 15mL Infarction Location: IMPRESSION: 1. Negative CTA for emergent large vessel occlusion. 2. Negative CT perfusion. No evidence for acute infarct or perfusion mismatch. 3. Moderate to severe diffuse narrowing of the mid-distal right M1 segment to approximately 75%. This is presumably chronic in nature given the lack of perfusion mismatch. 4. Patent left carotid artery stent. Moderate narrowing at the distal aspect of the stent to approximately 70% 5. Prominent atheromatous disease involving the visualized aortic arch, with additional mild to moderate multifocal atherosclerotic changes as above. Results were called by telephone at the time of interpretation on 07/18/2016 at 4:38 am to Dr. Ritta Slot , who verbally acknowledged these results. Electronically Signed   By: Rise Mu M.D.   On: 07/18/2016 05:12   Mr Brain Wo Contrast  Result Date: 07/18/2016 CLINICAL DATA:  Acute presentation with sudden onset weakness on the right and aphasia. EXAM: MRI HEAD WITHOUT CONTRAST TECHNIQUE: Multiplanar, multiecho pulse sequences of the brain and surrounding structures were obtained without intravenous contrast. COMPARISON:  CT study same day FINDINGS: Brain: Diffusion imaging shows acute infarction along the gyral surface of the precentral gyrus on the right. No evidence of swelling or hemorrhage. The brainstem is normal few old small vessel cerebellar infarctions. Cerebral hemispheres show old hemorrhagic lacunar infarction affecting the right lateral thalamus/ posterior limb internal capsule, an old right posterior frontal cortical and subcortical infarction, small old left parietal cortical and subcortical infarctions, and chronic small-vessel ischemic changes of the cerebral hemispheric white matter. Some residual hemosiderin associated with a small left frontal  infarction. No evidence of neoplastic mass lesion, hydrocephalus or extra-axial collection. Vascular: Major vessels at the base of the brain show flow. Skull and upper cervical spine: Negative Sinuses/Orbits: Clear/ normal.  Small mastoid effusion on the right Other: None significant IMPRESSION: Small region of acute infarction affecting the gyral surface of the precentral gyrus on the right. Extensive chronic small vessel ischemic changes as outlined above, with old hemosiderin deposition in the right lateral thalamus/posterior limb internal capsule and left frontal cortical/subcortical brain. Small old cortical infarctions in the right posterior frontal, left posterior frontal, and left parietal regions. Electronically Signed   By: Paulina Fusi M.D.   On: 07/18/2016 07:03   Ct Cerebral Perfusion W Contrast  Result Date: 07/18/2016 CLINICAL DATA:  Initial evaluation for acute aphasia with right-sided weakness. EXAM: CT ANGIOGRAPHY HEAD AND NECK CT PERFUSION BRAIN TECHNIQUE: Multidetector CT imaging of the head and neck was performed using the standard protocol during bolus administration of intravenous contrast. Multiplanar CT image reconstructions and MIPs were obtained to evaluate the vascular anatomy. Carotid stenosis measurements (when applicable) are obtained utilizing NASCET criteria, using the distal internal carotid diameter as the denominator. Multiphase CT imaging of the brain was performed following IV bolus  contrast injection. Subsequent parametric perfusion maps were calculated using RAPID software. CONTRAST:  90 cc of Isovue 370. COMPARISON:  Prior noncontrast head CT from earlier the same day. FINDINGS: CTA NECK FINDINGS Aortic arch: Visualized aortic arch of normal caliber with normal branch pattern. The head be calcified noncalcified plaque within the arch itself with few small focal penetrating atheromatous plaques (series 9, image 319). No high-grade stenosis about the origin of the great  vessels. Visualized subclavian artery is patent without stenosis. Right carotid system: Right common carotid artery patent from its origin to the bifurcation without stenosis. Calcified noncalcified plaque about the right bifurcation/proximal right ICA without flow-limiting stenosis. Right ICA widely patent distally to the skullbase without stenosis, dissection, or occlusion. Left carotid system: Left common carotid artery patent from its origin to the bifurcation. Carotid artery's stent present at the left bifurcation extending into the proximal left ICA and. There is patent flow through the stent, although there is moderate narrowing at the distal aspect of the stent by approximately 70% (series 9, image 200). Left ICA widely patent distally to the skullbase without stenosis, dissection, or occlusion. Vertebral arteries: Both of the vertebral arteries arise from the subclavian arteries. Left vertebral artery dominant. Focal moderate narrowing of the left AV 2 segment at the level of C3 related to extrinsic compression from degenerative changes (series 9, image 202). Vertebral artery is otherwise patent within the neck without stenosis, dissection, or occlusion. Skeleton: No acute osseus abnormality. No worrisome lytic or blastic osseous lesions. Moderate to advanced multilevel degenerative spondylolysis noted within cervical spine. Other neck: No acute soft tissue abnormality within the neck. No adenopathy. Thyroid normal. Upper chest: Visualized upper chest within normal limits. Partially visualized lungs are clear. Review of the MIP images confirms the above findings CTA HEAD FINDINGS Anterior circulation: The petrous segments patent bilaterally without stenosis. Multifocal atheromatous plaque within the cavernous/ supraclinoid ICAs with mild to moderate multifocal narrowing. No high-grade stenosis. ICA termini widely patent. Left A1 segment dominant and patent. Right A1 segment slightly diminutive but patent as  well. Anterior cerebral artery is widely patent to their distal aspects. Left M1 segment mildly irregular but patent to its distal aspect without significant stenosis. No proximal left M2 occlusion. Distal left MCA branches widely patent. Left M1 segment widely patent at its origin. The right M1 smoothly taper is at its mid and distal aspect with up twos 75% stenosis at its distal aspect. No filling defect to suggest intraluminal thrombus or other significant intimal irregularity to suggest that this is acute in nature. Stenosis extends into the anterior right temporal branch. Posterior branches attenuated and small as well. No proximal M2 occlusion. Distal right MCA branches well opacified and symmetric with the left. Posterior circulation: Scattered plaque present within the dominant left V4 segment without flow-limiting stenosis. Hypoplastic right vertebral artery divides at the takeoff of the right PICA, with a small ascending branch ascending towards the vertebrobasilar junction. Right PICA patent. Left PICA not visualized. Basilar artery demonstrates mild multifocal atheromatous irregularity but is widely patent to its distal aspect. Superior cerebral arteries patent bilaterally. Right PCA largely supplied via the basilar artery and is widely patent to its distal aspect. Small right posterior communicating artery noted as well. Predominant fetal type left PCA supplied via a widely patent left posterior communicating artery. Left PCA patent to its distal aspect. Venous sinuses: Patent. Anatomic variants: No significant anatomic variant. No aneurysm or vascular malformation. Delayed phase: Not performed. Review of the MIP images confirms  the above findings CT Brain Perfusion Findings: CBF (<30%) Volume: 0mL Perfusion (Tmax>6.0s) volume: 0mL Mismatch Volume: 0mL Infarction Location: IMPRESSION: 1. Negative CTA for emergent large vessel occlusion. 2. Negative CT perfusion. No evidence for acute infarct or  perfusion mismatch. 3. Moderate to severe diffuse narrowing of the mid-distal right M1 segment to approximately 75%. This is presumably chronic in nature given the lack of perfusion mismatch. 4. Patent left carotid artery stent. Moderate narrowing at the distal aspect of the stent to approximately 70% 5. Prominent atheromatous disease involving the visualized aortic arch, with additional mild to moderate multifocal atherosclerotic changes as above. Results were called by telephone at the time of interpretation on 07/18/2016 at 4:38 am to Dr. Ritta Slot , who verbally acknowledged these results. Electronically Signed   By: Rise Mu M.D.   On: 07/18/2016 05:12   Ct Head Code Stroke W/o Cm  Result Date: 07/18/2016 CLINICAL DATA:  Code stroke. Initial evaluation for acute aphasia, right-sided weakness peer EXAM: CT HEAD WITHOUT CONTRAST TECHNIQUE: Contiguous axial images were obtained from the base of the skull through the vertex without intravenous contrast. COMPARISON:  Prior CT from 06/17/2014. FINDINGS: Brain: Moderate cerebral atrophy with chronic small vessel ischemic disease. Remote anterior right MCA territory infarct. Additional small remote left ACA/MCA territory infarct noted at the vertex. No acute intracranial hemorrhage. No acute large vessel territory infarct. No mass lesion, midline shift or mass effect. No hydrocephalus. No extra-axial fluid collection. Vascular: No hyperdense vessel. Prominent intracranial atherosclerosis. Skull: Scalp soft tissues and calvarium within normal limits. Sinuses/Orbits: Globes and oval soft tissues within normal limits. Paranasal sinuses are clear. Right mastoid effusion noted. ASPECTS [facility] Stroke Program Early CT Score) - Ganglionic level infarction (caudate, lentiform nuclei, internal capsule, insula, M1-M3 cortex): 7 - Supraganglionic infarction (M4-M6 cortex): 3 Total score (0-10 with 10 being normal): 10 IMPRESSION: 1. No acute intracranial  infarct or other process identified. 2. ASPECTS is 10 3. Moderate cerebral atrophy with chronic microvascular ischemic disease and scattered remote infarcts as above. Critical Value/emergent results were called by telephone at the time of interpretation on 07/18/2016 at 4:18 am to Dr. Amada Jupiter , who verbally acknowledged these results. Electronically Signed   By: Rise Mu M.D.   On: 07/18/2016 04:21     Subjective: No acute changes.   Discharge Exam: Vitals:   07/21/16 1016 07/21/16 1322  BP: 123/70 (!) 102/56  Pulse: 75 75  Resp: 16 16  Temp: 98.3 F (36.8 C) 97.9 F (36.6 C)   Vitals:   07/21/16 0131 07/21/16 0549 07/21/16 1016 07/21/16 1322  BP: (!) 146/84 (!) 145/78 123/70 (!) 102/56  Pulse: 63 74 75 75  Resp: 16 16 16 16   Temp: 97.5 F (36.4 C) 97.6 F (36.4 C) 98.3 F (36.8 C) 97.9 F (36.6 C)  TempSrc: Oral Axillary Oral Oral  SpO2: 99% 100% 99% 99%  Weight:       General exam: elderly chronically ill appearing male, lethargic but arousable.  Respiratory system: No increased work of breathing. Cardiovascular system: S1 & S2 heard Gastrointestinal system: Abdomen is nondistended, soft and nontender. Normal bowel sounds heard. Central nervous system: strength deficit LUE 2-3/5 Extremities: no cyanosis.  The results of significant diagnostics from this hospitalization (including imaging, microbiology, ancillary and laboratory) are listed below for reference.     Microbiology: No results found for this or any previous visit (from the past 240 hour(s)).   Labs: BNP (last 3 results)  Recent Labs  07/31/15 1545  10/03/15 2316 10/12/15 0745  BNP 39.0 156.1* 214.0*   Basic Metabolic Panel:  Recent Labs Lab 07/18/16 0344 07/18/16 0351 07/18/16 0757 07/19/16 0539 07/20/16 0346  NA 124* 128*  --  128* 128*  K 3.9 4.0  --  4.0 3.6  CL 93* 92*  --  96* 97*  CO2 24  --   --  22 23  GLUCOSE 111* 109*  --  96 117*  BUN 15 17  --  14 15  CREATININE  1.50* 1.60*  --  1.37* 1.36*  CALCIUM 8.5*  --   --  8.9 8.9  MG  --   --  1.6*  --  2.4  PHOS  --   --  3.8  --   --    Liver Function Tests:  Recent Labs Lab 07/18/16 0344 07/20/16 0346  AST 25 21  ALT 14* 13*  ALKPHOS 83 76  BILITOT 1.3* 1.7*  PROT 7.1 7.4  ALBUMIN 3.3* 3.4*   No results for input(s): LIPASE, AMYLASE in the last 168 hours. No results for input(s): AMMONIA in the last 168 hours. CBC:  Recent Labs Lab 07/18/16 0344 07/18/16 0351 07/19/16 0539 07/20/16 0346  WBC 7.3  --  7.0 7.9  NEUTROABS 5.3  --   --   --   HGB 10.2* 10.9* 11.2* 11.2*  HCT 30.6* 32.0* 33.0* 32.2*  MCV 90.8  --  91.2 90.4  PLT 144*  --  145* 136*   Cardiac Enzymes: No results for input(s): CKTOTAL, CKMB, CKMBINDEX, TROPONINI in the last 168 hours. BNP: Invalid input(s): POCBNP CBG:  Recent Labs Lab 07/18/16 0341  GLUCAP 115*   D-Dimer No results for input(s): DDIMER in the last 72 hours. Hgb A1c No results for input(s): HGBA1C in the last 72 hours. Lipid Profile No results for input(s): CHOL, HDL, LDLCALC, TRIG, CHOLHDL, LDLDIRECT in the last 72 hours. Thyroid function studies No results for input(s): TSH, T4TOTAL, T3FREE, THYROIDAB in the last 72 hours.  Invalid input(s): FREET3 Anemia work up  Recent Labs  07/18/16 1637  VITAMINB12 602  FOLATE 24.4  FERRITIN 102  TIBC 321  IRON 58  RETICCTPCT 1.8   Urinalysis    Component Value Date/Time   COLORURINE YELLOW 07/20/2016 1740   APPEARANCEUR CLEAR 07/20/2016 1740   LABSPEC 1.020 07/20/2016 1740   PHURINE 5.0 07/20/2016 1740   GLUCOSEU NEGATIVE 07/20/2016 1740   HGBUR NEGATIVE 07/20/2016 1740   BILIRUBINUR NEGATIVE 07/20/2016 1740   KETONESUR NEGATIVE 07/20/2016 1740   PROTEINUR NEGATIVE 07/20/2016 1740   UROBILINOGEN 0.2 07/07/2014 0604   NITRITE NEGATIVE 07/20/2016 1740   LEUKOCYTESUR NEGATIVE 07/20/2016 1740   Sepsis Labs Invalid input(s): PROCALCITONIN,  WBC,  LACTICIDVEN Microbiology No results  found for this or any previous visit (from the past 240 hour(s)).  Time coordinating discharge: 40 mins  SIGNED:  Standley Dakins, MD  [facility] 07/21/2016, 2:39 PM Pager [phone]  If 7PM-7AM, please contact night-coverage [email or web address] Password [email or web address]

## 2016-07-21 NOTE — Progress Notes (Signed)
Patient provided warm prune juice to help have a BM. RN will continue to monitor.

## 2016-07-21 NOTE — Progress Notes (Addendum)
Occupational Therapy Treatment Patient Details Name: [name] MRN: [id number] DOB: 11-21-1926 Today's Date: 07/21/2016    History of present illness Pt adm with LUE weakness and decr responsiveness. MRI showed acute infarct rt precentral gyrus. PMH -HTN, gout, CAD, CHF, AAA, CVA, copd   OT comments  Pt progressing toward OT goals. He was able to complete UB bathing and grooming tasks at sink level with supervision this session with B UE supported. He additionally was able to complete toilet transfers with min guard assist for safety. Pt with decreased functional use of L UE as compared to OT evaluation and initiated L shoulder HEP to improve functional use of L UE during ADL tasks. Updated D/C recommendation to home health OT services as feel pt will best benefit from OT follow-up in his natural environment. OT will continue to follow while admitted.    Follow Up Recommendations  Home health OT;Supervision/Assistance - 24 hour    Equipment Recommendations  3 in 1 bedside commode    Recommendations for Other Services      Precautions / Restrictions Precautions Precautions: Fall Restrictions Weight Bearing Restrictions: No       Mobility Bed Mobility Overal bed mobility: Needs Assistance Bed Mobility: Supine to Sit     Supine to sit: Min guard     General bed mobility comments: pt up upon arrive  Transfers Overall transfer level: Needs assistance Equipment used: Rolling walker (2 wheeled) Transfers: Sit to/from Stand Sit to Stand: Min guard         General transfer comment: Min guard assist for safety.     Balance Overall balance assessment: Needs assistance Sitting-balance support: No upper extremity supported;Feet supported Sitting balance-Leahy Scale: Good     Standing balance support: No upper extremity supported;During functional activity Standing balance-Leahy Scale: Fair Standing balance comment: Able to statically stand without UE support for  ADL. Does lean on counter at times during standing grooming tasks for stability.                           ADL either performed or assessed with clinical judgement   ADL Overall ADL's : Needs assistance/impaired Eating/Feeding: Set up;Sitting   Grooming: Standing;Min guard   Upper Body Bathing: Minimal assistance;Sitting               Toilet Transfer: Min guard;Ambulation;BSC;RW Toilet Transfer Details (indicate cue type and reason): Pt pushing RW unsafely requiring assistance for safety. Family previous session reporting that pt uses RW at baseline but family today reporting that he does not.         Functional mobility during ADLs: Min guard;Rolling walker General ADL Comments: Pt requiring VC's for safe use of RW. He reports decreased functional use of L UE and did demonstrate 3/5 L UE strength grossly this session. Able to stand at sink for UB bathing and grooming tasks with supervision leaning on counter on elbows. Utilized [employer] [place] interpreter [phone] during session with family assisting as well as it is difficult for pt to hear interpreter.     Vision Patient Visual Report: No change from baseline Additional Comments: Pt able to identify items in his L visual field this session. Listing to the L during mobility impacting his safety with maneuvering around objects on his L.   Perception     Praxis      Cognition Arousal/Alertness: Awake/alert Behavior During Therapy: WFL for tasks assessed/performed Overall Cognitive Status: History of cognitive impairments -  at baseline                                 General Comments: Pt following commands and conversing appropriately.        Exercises General Exercises - Upper Extremity Shoulder Flexion: AROM;Both;10 reps;Seated (R UE limited to approximately 110 degrees.)   Shoulder Instructions       General Comments pt speaks other language. son-in-law interpretated. Spoke  extensively with family disucssing PT and d/c recommendations.    Pertinent Vitals/ Pain       Pain Assessment: Faces Faces Pain Scale: No hurt  Home Living                                          Prior Functioning/Environment              Frequency  Min 2X/week        Progress Toward Goals  OT Goals(current goals can now be found in the care plan section)  Progress towards OT goals: Progressing toward goals  Acute Rehab OT Goals Patient Stated Goal: didn't state OT Goal Formulation: With patient/family Time For Goal Achievement: 08/02/16 Potential to Achieve Goals: Good  Plan Discharge plan needs to be updated    Co-evaluation                 AM-PAC PT "6 Clicks" Daily Activity     Outcome Measure   Help from another person eating meals?: A Little Help from another person taking care of personal grooming?: A Little Help from another person toileting, which includes using toliet, bedpan, or urinal?: A Little Help from another person bathing (including washing, rinsing, drying)?: A Little Help from another person to put on and taking off regular upper body clothing?: A Little Help from another person to put on and taking off regular lower body clothing?: A Little 6 Click Score: 18    End of Session Equipment Utilized During Treatment: Gait belt;Rolling walker  OT Visit Diagnosis: Unsteadiness on feet (R26.81);Hemiplegia and hemiparesis Hemiplegia - Right/Left: Left Hemiplegia - dominant/non-dominant: Non-Dominant Hemiplegia - caused by: Cerebral infarction   Activity Tolerance Patient tolerated treatment well   Patient Left Other (comment) (ambulating with PT)   Nurse Communication Mobility status        Time: 4166-0630 OT Time Calculation (min): 18 min  Charges: OT General Charges $OT Visit: 1 Procedure OT Treatments $Self Care/Home Management : 8-22 mins  Doristine Section, MS OTR/L  Pager: [phone]    Lequita Meadowcroft A  Moya Duan 07/21/2016, 10:17 AM

## 2016-07-21 NOTE — Progress Notes (Signed)
Physical Therapy Treatment Patient Details Name: [name] MRN: [id number] DOB: Jun 25, 1926 Today's Date: 07/21/2016    History of Present Illness Pt adm with LUE weakness and decr responsiveness. MRI showed acute infarct rt precentral gyrus. PMH -HTN, gout, CAD, CHF, AAA, CVA, copd    PT Comments    Pt presenting with L sided weakness and impaired balance. Pt very unsafe with RW and recommend pt ambulate with family providing HHA. Family reports pt to have eben 100% indep prior to CVA. Recommend HHPT to achieve safe mod I level of function.   Follow Up Recommendations  Home health PT;DC plan and follow up therapy as arranged by surgeon     Equipment Recommendations  None recommended by PT    Recommendations for Other Services       Precautions / Restrictions Precautions Precautions: Fall Restrictions Weight Bearing Restrictions: No    Mobility  Bed Mobility Overal bed mobility: Needs Assistance Bed Mobility: Supine to Sit     Supine to sit: Min guard     General bed mobility comments: pt up upon arrive  Transfers Overall transfer level: Needs assistance Equipment used: Rolling walker (2 wheeled) Transfers: Sit to/from Stand Sit to Stand: Min guard         General transfer comment: Min guard assist for safety.   Ambulation/Gait Ambulation/Gait assistance: Min assist;Min guard Ambulation Distance (Feet): 250 Feet Assistive device: Rolling walker (2 wheeled) Gait Pattern/deviations: Step-through pattern;Decreased stride length;Staggering left;Staggering right;Wide base of support Gait velocity: dec Gait velocity interpretation: Below normal speed for age/gender General Gait Details: pt very unsafe with RW as pt with poor management despite max v/c's. Despite mild instability during amb without AD pt safer without AD due to poor walker mangement. Pt requiring minA during ambulation when turning head l/r and up/down. pt took 3 standing rest  break   Stairs Stairs: Yes   Stair Management: Sideways;One rail Right (hand held assist) Number of Stairs: 2 General stair comments: v/c's to step up with R LE and step down with L LT  Wheelchair Mobility    Modified Rankin (Stroke Patients Only)       Balance Overall balance assessment: Needs assistance Sitting-balance support: No upper extremity supported;Feet supported Sitting balance-Leahy Scale: Good     Standing balance support: No upper extremity supported;During functional activity Standing balance-Leahy Scale: Fair Standing balance comment: Able to statically stand without UE support for ADL. Does lean on counter at times during standing grooming tasks for stability.                            Cognition Arousal/Alertness: Awake/alert Behavior During Therapy: WFL for tasks assessed/performed Overall Cognitive Status: History of cognitive impairments - at baseline                                 General Comments: Pt following commands and conversing appropriately.      Exercises      General Comments General comments (skin integrity, edema, etc.): pt speaks other language. son-in-law interpretated. Spoke extensively with family disucssing PT and d/c recommendations.      Pertinent Vitals/Pain Pain Assessment: Faces Faces Pain Scale: No hurt    Home Living                      Prior Function  PT Goals (current goals can now be found in the care plan section) Acute Rehab PT Goals Patient Stated Goal: didn't state Progress towards PT goals: Progressing toward goals    Frequency    Min 3X/week      PT Plan Discharge plan needs to be updated    Co-evaluation              AM-PAC PT "6 Clicks" Daily Activity  Outcome Measure  Difficulty turning over in bed (including adjusting bedclothes, sheets and blankets)?: A Little Difficulty moving from lying on back to sitting on the side of the bed? :  A Little Difficulty sitting down on and standing up from a chair with arms (e.g., wheelchair, bedside commode, etc,.)?: A Little Help needed moving to and from a bed to chair (including a wheelchair)?: A Little Help needed walking in hospital room?: A Little Help needed climbing 3-5 steps with a railing? : A Little 6 Click Score: 18    End of Session Equipment Utilized During Treatment: Gait belt Activity Tolerance: Patient tolerated treatment well Patient left: in chair;with call bell/phone within reach;with chair alarm set;with family/visitor present Nurse Communication: Mobility status PT Visit Diagnosis: Unsteadiness on feet (R26.81);Muscle weakness (generalized) (M62.81)     Time: 5035-4656 PT Time Calculation (min) (ACUTE ONLY): 31 min  Charges:  $Gait Training: 8-22 mins $Therapeutic Activity: 8-22 mins                    G Codes:       Lewis Shock, PT, DPT Pager #: [phone] Office #: [phone]    Kamila Broda M Jaelle Campanile 07/21/2016, 9:47 AM

## 2016-07-21 NOTE — Care Management Note (Signed)
Case Management Note  Patient Details  Name: [name] MRN: [id number] Date of Birth: 04/24/1926  Subjective/Objective:                    Action/Plan: Pt discharging home with orders for [facility] services. CM met with the patient and his wife and provided them a list of [place] agencies. They selected [facility]. Santiago Glad with [facility] notified and accepted the referral. Wife does not want PT and aide. Santiago Glad aware and will update the MD.  Pt with orders for 3 in 1. Santiago Glad with [facility] will deliver the equipment to the room.  Pt with large family that is able to provide 24/7 supervision. They also will provide transport home.   Expected Discharge Date:  07/21/16               Expected Discharge Plan:  [place]  In-House Referral:     Discharge planning Services  CM Consult  Post Acute Care Choice:  Home Health, Durable Medical Equipment Choice offered to:  Patient, Spouse  DME Arranged:  3-N-1 DME Agency:  [facility]:  RN, PT, OT, Nurse's Aide, Speech Therapy, Social Work (Wife refused PT/aide) [place] Agency:  [place]  Status of Service:  Completed, signed off  If discussed at [employer], dates discussed:    Additional Comments:  Pollie Friar, RN 07/21/2016, 3:10 PM

## 2016-07-21 NOTE — Discharge Instructions (Signed)
Information on my medicine - Pradaxa® (dabigatran) ° °This medication education was reviewed with me or my healthcare representative as part of my discharge preparation.  The pharmacist that spoke with me during my hospital stay was:  Layna Roeper Donovan, RPH ° °Why was Pradaxa® prescribed for you? °Pradaxa® was prescribed for you to reduce the risk of forming blood clots that cause a stroke if you have a medical condition called atrial fibrillation (a type of irregular heartbeat).   ° °What do you Need to know about PradAXa®? °Take your Pradaxa® TWICE DAILY - one capsule in the morning and one tablet in the evening with or without food.  It would be best to take the doses about the same time each day. ° °The capsules should not be broken, chewed or opened - they must be swallowed whole. ° °Do not store Pradaxa in other medication containers - once the bottle is opened the Pradaxa should be used within FOUR months; throw away any capsules that haven’t been by that time. ° °Take Pradaxa® exactly as prescribed by your doctor.  DO NOT stop taking Pradaxa® without talking to the doctor who prescribed the medication.  Stopping without other stroke prevention medication to take the place of Pradaxa may increase your risk of developing a clot that causes a stroke.  Refill your prescription before you run out. ° °After discharge, you should have regular check-up appointments with your healthcare provider that is prescribing your Pradaxa®.  In the future your dose may need to be changed if your kidney function or weight changes by a significant amount. ° °What do you do if you miss a dose? °If you miss a dose, take it as soon as you remember on the same day.  If your next dose is less than 6 hours away, skip the missed dose.  Do not take two doses of PRADAXA at the same time. ° °Important Safety Information °A possible side effect of Pradaxa® is bleeding. You should call your healthcare provider right away if you  experience any of the following: °? Bleeding from an injury or your nose that does not stop. °? Unusual colored urine (red or dark brown) or unusual colored stools (red or black). °? Unusual bruising for unknown reasons. °? A serious fall or if you hit your head (even if there is no bleeding). ° °Some medicines may interact with Pradaxa® and might increase your risk of bleeding or clotting while on Pradaxa®. To help avoid this, consult your healthcare provider or pharmacist prior to using any new prescription or non-prescription medications, including herbals, vitamins, non-steroidal anti-inflammatory drugs (NSAIDs) and supplements. ° °This website has more information on Pradaxa® (dabigatran): https://www.pradaxa.com ° ° ° °

## 2016-07-23 ENCOUNTER — Encounter: Payer: Self-pay | Admitting: Cardiovascular Disease

## 2016-07-23 ENCOUNTER — Ambulatory Visit (INDEPENDENT_AMBULATORY_CARE_PROVIDER_SITE_OTHER): Payer: Medicare Other | Admitting: Cardiovascular Disease

## 2016-07-23 DIAGNOSIS — I739 Peripheral vascular disease, unspecified: Secondary | ICD-10-CM

## 2016-07-23 DIAGNOSIS — I779 Disorder of arteries and arterioles, unspecified: Secondary | ICD-10-CM

## 2016-07-23 DIAGNOSIS — I5022 Chronic systolic (congestive) heart failure: Secondary | ICD-10-CM | POA: Diagnosis not present

## 2016-07-23 DIAGNOSIS — I480 Paroxysmal atrial fibrillation: Secondary | ICD-10-CM | POA: Diagnosis not present

## 2016-07-23 DIAGNOSIS — E7800 Pure hypercholesterolemia, unspecified: Secondary | ICD-10-CM | POA: Diagnosis not present

## 2016-07-23 DIAGNOSIS — I2510 Atherosclerotic heart disease of native coronary artery without angina pectoris: Secondary | ICD-10-CM

## 2016-07-23 MED ORDER — CARVEDILOL 6.25 MG PO TABS: 6.2500 mg | ORAL_TABLET | Freq: Two times a day (BID) | ORAL | 6 refills | Status: DC

## 2016-07-23 NOTE — Assessment & Plan Note (Signed)
History of coronary artery disease status post cardiac catheterization performed by Dr. Swaziland is a right radial approach revealing 80% left main with occluded RCA and LV dysfunction. He was decided treat him medically because of his age and comorbidities. He does get occasional chest pain. He is on an oral nitrate and beta blocker.

## 2016-07-23 NOTE — Assessment & Plan Note (Signed)
History of hyperlipidemia on statin therapy with recent lipid profile performed 07/18/16 revealing total cholesterol 100, LDL 42 and HDL of 30

## 2016-07-23 NOTE — Patient Instructions (Signed)
Medication Instructions: Increase Carvedilol to 6.25 mg twice daily.   Follow-Up: Your physician recommends that you schedule a follow-up appointment in: 1 month with [facility] for BP check. Your physician has requested that you regularly monitor and record your blood pressure readings at home. Please use the same machine at the same time of day to check your readings and record them to bring to your follow-up visit.  We request that you follow-up in: 6 months with an extender and in 12 months with Dr [name] Morelle will receive a reminder letter in the mail two months in advance. If you don't receive a letter, please call our office to schedule the follow-up appointment.  If you need a refill on your cardiac medications before your next appointment, please call your pharmacy.

## 2016-07-23 NOTE — Assessment & Plan Note (Signed)
History of essential hypertension blood pressure is 158/78. He is on low-dose carvedilol. I am going to increase this from 3.125 ---> 6.25 mg by mouth twice a day and will have him come back to see Kristen for blood pressure follow-up and one month.

## 2016-07-23 NOTE — Assessment & Plan Note (Signed)
History of paroxysmal atrial fibrillation previously on Xarelto  with recent admission for CVA several days ago. He was switched to Pradaxa . He still has some left upper extremity motor dysfunction.

## 2016-07-23 NOTE — Assessment & Plan Note (Signed)
Chronic systolic heart failure with ischemic cardiomyopathy. His initial EF was 35% range however recent 2-D echo revealed improvement in the ejection fraction to the 45-50% range performed 07/18/16.

## 2016-07-23 NOTE — Progress Notes (Signed)
07/23/2016 [name]   07/02/1926  [id number]  Primary Physician [name], MD Primary Cardiologist: [name] [name]  HPI:  [name] is a 83 year old old married [place] male who has a history of congestive heart failure secondary to ischemic cardiomyopathy. I last saw him in the office 07/31/15.[name] He was found to be in atrial fibrillation and apparently converted to sinus rhythm. The echocardiogram revealed severe dysfunction with an ejection fraction in the 35% range. He had stroke like symptoms and was evaluated by the stroke service. An MRI suggested multiple embolic strokes possibly related to the atial fibrillation.carotid Dopplers were performed and suggested high-grade left internal carotid artery stenosis with string sign". The patient underwent cardiac catheterization by Dr. Swaziland the right radial approach that revealed 80% left main stenosis with an occluded dominant RCA.. Consensus was to treat him medically for this. I was asked to see the patient for consideration of left internal carotid artery stenting. Extensive conversation was had with the patient and his family and after careful consideration of the risks and benefits of medical therapy versus percutaneous intervention, it was decided to proceed with carotid artery stenting.which was performed on 04/28/13 successfully.his post procedure Dopplers in our office were excellent as were his most recent Dopplers in October during the hospitalization. He did have paroxysmal atrial fibrillation with strokes and has maintained sinus rhythm on Xarelto oral anticoagulation. Since I saw him 6 months ago he's remained relatively stable. He was admitted last month with chest pain and ruled out for myocardial infarction. A 2-D echo revealed an ejection fraction of 40%. He does admit to dietary indiscretion with regard to salt. Since I saw him in the office and admitted to [facility] earlier this  month with atypical chest and abdominal pain. He ruled out for myocardial infarction. He was seen in consultation by Dr. [name] who did not feel that his pain was cardiac in nature. He did have carotid Dopplers performed 03/12/16 revealing the stent to be widely patent. He was admitted to [facility] 07/18/16 for 3 days with a stroke. He has residual left upper extremity motor weakness. His oral anticoagulation was switched from Xarelto to Pradaxa. Recent 2-D echocardiogram performed 07/18/16 revealed improvement visit his ejection fraction up to 45-50%. He did have moderate mitral regurgitation.  Current Outpatient Prescriptions  Medication Sig Dispense Refill  . allopurinol (ZYLOPRIM) 300 MG tablet Take 300 mg by mouth daily. To prevent gout    . arformoterol (BROVANA) 15 MCG/2ML NEBU Take 2 mLs (15 mcg total) by nebulization 2 (two) times daily. 120 mL 2  . aspirin EC 81 MG tablet Take 1 tablet (81 mg total) by mouth daily. 90 tablet 3  . atorvastatin (LIPITOR) 40 MG tablet Take 1 tablet (40 mg total) by mouth daily at 6 PM. 30 tablet 5  . budesonide (PULMICORT) 0.5 MG/2ML nebulizer solution Take 2 mLs (0.5 mg total) by nebulization 2 (two) times daily. 2 mL 12  . carvedilol (COREG) 6.25 MG tablet Take 1 tablet (6.25 mg total) by mouth 2 (two) times daily with a meal. 60 tablet 6  . dabigatran (PRADAXA) 75 MG CAPS capsule Take 1 capsule (75 mg total) by mouth every 12 (twelve) hours. 60 capsule 0  . esomeprazole (NEXIUM) 40 MG capsule Take 1 capsule (40 mg total) by mouth 2 (two) times daily before a meal.    . isosorbide mononitrate (IMDUR) 30 MG 24 hr tablet Take 30  mg by mouth once daily (Patient taking differently: Take 30 mg by mouth daily. For control of heart pain) 15 tablet 9  . NITROSTAT 0.4 MG SL tablet PLACE 1 TABLET UNDER THE TONGUE EVERY 5 MINUTES FOR 3 DOSES AS NEEDED FOR CHEST PAIN 25 tablet 5  . polyethylene glycol (MIRALAX / GLYCOLAX) packet Take 17 g by mouth daily as needed.  14 each 0  . tamsulosin (FLOMAX) 0.4 MG CAPS capsule Take 0.4 mg by mouth daily. To improve bladder function     No current facility-administered medications for this visit.     Allergies  Allergen Reactions  . Levofloxacin Anaphylaxis and Other (See Comments)    Patient told his daughter that it made him "feel worse than he already did" (overall) Headache also (has refused to take anymore)  . Eliquis [Apixaban] Itching and Swelling  . Phenazopyridine Hcl Other (See Comments)    Unknown reaction  . Septra [Sulfamethoxazole-Trimethoprim] Nausea And Vomiting    GI Upset  . Latex Rash  . Tape Rash    Social History   Social History  . Marital status: Married    Spouse name: san  . Number of children: 13  . Years of education: 4th grade   Occupational History  . retired    Social History Main Topics  . Smoking status: Former Smoker    Quit date: 02/17/2002  . Smokeless tobacco: Never Used     Comment: Quit seven years ago   . Alcohol use No  . Drug use: No  . Sexual activity: No   Other Topics Concern  . Not on file   Social History Narrative   Patient lives at home with his daughter   Patient is right handed    Patient drinks tea and coffee     Review of Systems: General: negative for chills, fever, night sweats or weight changes.  Cardiovascular: negative for chest pain, dyspnea on exertion, edema, orthopnea, palpitations, paroxysmal nocturnal dyspnea or shortness of breath Dermatological: negative for rash Respiratory: negative for cough or wheezing Urologic: negative for hematuria Abdominal: negative for nausea, vomiting, diarrhea, bright red blood per rectum, melena, or hematemesis Neurologic: negative for visual changes, syncope, or dizziness All other systems reviewed and are otherwise negative except as noted above.    Blood pressure (!) 167/85, pulse 75, height 5' (1.524 m), weight 143 lb 12.8 oz (65.2 kg).  General appearance: alert and no  distress Neck: no adenopathy, no carotid bruit, no JVD, supple, symmetrical, trachea midline and thyroid not enlarged, symmetric, no tenderness/mass/nodules Lungs: clear to auscultation bilaterally Heart: regular rate and rhythm, S1, S2 normal, no murmur, click, rub or gallop Extremities: extremities normal, atraumatic, no cyanosis or edema  EKG not performed today  ASSESSMENT AND PLAN:   Hypertension History of essential hypertension blood pressure is 158/78. He is on low-dose carvedilol. I am going to increase this from 3.125 ---> 6.25 mg by mouth twice a day and will have him come back to see Kristen for blood pressure follow-up and one month.  Coronary artery disease History of coronary artery disease status post cardiac catheterization performed by Dr. Swaziland is a right radial approach revealing 80% left main with occluded RCA and LV dysfunction. He was decided treat him medically because of his age and comorbidities. He does get occasional chest pain. He is on an oral nitrate and beta blocker.  Hyperlipidemia History of hyperlipidemia on statin therapy with recent lipid profile performed 07/18/16 revealing total cholesterol 100, LDL 42  and HDL of 30  Chronic systolic heart failure (HCC) Chronic systolic heart failure with ischemic cardiomyopathy. His initial EF was 35% range however recent 2-D echo revealed improvement in the ejection fraction to the 45-50% range performed 07/18/16.  Paroxysmal atrial fibrillation (HCC) History of paroxysmal atrial fibrillation previously on Xarelto  with recent admission for CVA several days ago. He was switched to Pradaxa . He still has some left upper extremity motor dysfunction.  Carotid artery disease (HCC) History of carotid artery disease status post left internal carotid artery stenting by myself 04/28/13 which we have been following by serial duplex ultrasounds.      [name] FACP,FACC,FAHA, [facility] 07/23/2016 9:38 AM

## 2016-07-23 NOTE — Assessment & Plan Note (Signed)
History of carotid artery disease status post left internal carotid artery stenting by myself 04/28/13 which we have been following by serial duplex ultrasounds.

## 2016-08-25 ENCOUNTER — Ambulatory Visit (INDEPENDENT_AMBULATORY_CARE_PROVIDER_SITE_OTHER): Payer: Medicare Other | Admitting: Pharmacist Clinician (PhC)/ Clinical Pharmacy Specialist

## 2016-08-25 DIAGNOSIS — I10 Essential (primary) hypertension: Secondary | ICD-10-CM | POA: Diagnosis not present

## 2016-08-25 NOTE — Patient Instructions (Signed)
   Your blood pressure today is 144/84  Check your blood pressure at home several times each week and keep record of the readings.  Take your BP meds as follows:  Continue with carvedilol 6.25 mg twice daily  Bring all of your meds, your BP cuff and your record of home blood pressures to your next appointment.  Exercise as you're able, try to walk approximately 30 minutes per day.  Keep salt intake to a minimum, especially watch canned and prepared boxed foods.  Eat more fresh fruits and vegetables and fewer canned items.  Avoid eating in fast food restaurants.    HOW TO TAKE YOUR BLOOD PRESSURE: . Rest 5 minutes before taking your blood pressure. .  Don't smoke or drink caffeinated beverages for at least 30 minutes before. . Take your blood pressure before (not after) you eat. . Sit comfortably with your back supported and both feet on the floor (don't cross your legs). . Elevate your arm to heart level on a table or a desk. . Use the proper sized cuff. It should fit smoothly and snugly around your bare upper arm. There should be enough room to slip a fingertip under the cuff. The bottom edge of the cuff should be 1 inch above the crease of the elbow. . Ideally, take 3 measurements at one sitting and record the average.

## 2016-08-25 NOTE — Progress Notes (Signed)
08/25/2016 [name] 1926/06/29 [id number]   HPI:  [name] is a 83 y.o. male patient of Dr [name], with a PMH below who presents today for hypertension clinic evaluation.   He speaks [place] and is here today with his daughter who is translating for him.  His history is significant for atrial fibrillation with multiple strokes, now on Pradaxa (was on Xarelto at time of most recent CVA);  CAD with left internal carotid stenting in 2015; ischemic cardiomyopathy and CKD.  He also has a strong family history of dizziness.  Daughter cannot explain well, but notes that his father as well as several of his 6 siblings have also had this lifelong problem.  Patient was evaluated by OT for this, but apparently nothing definitive was found.  She also mentioned that he has a "hole" in one ear.   [name] has not yet taken any of his medications today, he usually takes around 9 am (is 9 now)  Blood Pressure Goal:  140/80 (patient age and strong history of dizziness)   Current Medications:  Carvedilol 6.25 bid  Family Hx:  Dizziness in father and most siblings (2 brothers, 4 sisters)  Mother died when patient was 82;  Social Hx:  Ex smoker quit 15 or more years ago; no alcohol, seldom drinks caffeinated drinks (2-4 per year)  Diet:  Appetite good; rice, all meats; does not like vegetables;   Exercise:  Walks outside 10-15 minutes; does this 6-8 times per day  Home BP readings:  Per daughter usually lower in am, notes that 110 or lower tends to make him feel weak/washed out  Average of last 2 weeks of readings 140/74 (range 104-167/58-89)  Intolerances:   Lisinopril caused him to be dizzy  Wt Readings from Last 3 Encounters:  07/23/16 143 lb 12.8 oz (65.2 kg)  07/20/16 147 lb (66.7 kg)  03/13/16 147 lb (66.7 kg)   BP Readings from Last 3 Encounters:  08/25/16 (!) 144/84  07/23/16 (!) 167/85  07/21/16 (!) 102/56   Pulse Readings from Last 3 Encounters:    08/25/16 72  07/23/16 75  07/21/16 75    Current Outpatient Prescriptions  Medication Sig Dispense Refill  . allopurinol (ZYLOPRIM) 300 MG tablet Take 300 mg by mouth daily. To prevent gout    . arformoterol (BROVANA) 15 MCG/2ML NEBU Take 2 mLs (15 mcg total) by nebulization 2 (two) times daily. 120 mL 2  . aspirin EC 81 MG tablet Take 1 tablet (81 mg total) by mouth daily. 90 tablet 3  . atorvastatin (LIPITOR) 40 MG tablet Take 1 tablet (40 mg total) by mouth daily at 6 PM. 30 tablet 5  . budesonide (PULMICORT) 0.5 MG/2ML nebulizer solution Take 2 mLs (0.5 mg total) by nebulization 2 (two) times daily. 2 mL 12  . carvedilol (COREG) 6.25 MG tablet Take 1 tablet (6.25 mg total) by mouth 2 (two) times daily with a meal. 60 tablet 6  . dabigatran (PRADAXA) 75 MG CAPS capsule Take 1 capsule (75 mg total) by mouth every 12 (twelve) hours. 60 capsule 0  . esomeprazole (NEXIUM) 40 MG capsule Take 1 capsule (40 mg total) by mouth 2 (two) times daily before a meal.    . isosorbide mononitrate (IMDUR) 30 MG 24 hr tablet Take 30 mg by mouth once daily (Patient taking differently: Take 30 mg by mouth daily. For control of heart pain) 15 tablet 9  . NITROSTAT 0.4 MG SL tablet PLACE 1 TABLET UNDER  THE TONGUE EVERY 5 MINUTES FOR 3 DOSES AS NEEDED FOR CHEST PAIN 25 tablet 5  . polyethylene glycol (MIRALAX / GLYCOLAX) packet Take 17 g by mouth daily as needed. 14 each 0  . tamsulosin (FLOMAX) 0.4 MG CAPS capsule Take 0.4 mg by mouth daily. To improve bladder function     No current facility-administered medications for this visit.     Allergies  Allergen Reactions  . Levofloxacin Anaphylaxis and Other (See Comments)    Patient told his daughter that it made him "feel worse than he already did" (overall) Headache also (has refused to take anymore)  . Eliquis [Apixaban] Itching and Swelling  . Phenazopyridine Hcl Other (See Comments)    Unknown reaction  . Septra [Sulfamethoxazole-Trimethoprim] Nausea  And Vomiting    GI Upset  . Latex Rash  . Tape Rash    Past Medical History:  Diagnosis Date  . AAA (abdominal aortic aneurysm) (HCC)   . Anemia   . Asthma   . Carotid artery disease (HCC)   . CHF (congestive heart failure) (HCC)   . Coronary artery disease   . Depression   . Gout   . H. pylori infection   . Hard of hearing   . Hiatal hernia   . Hyperplasia, prostate   . Hypertension   . PUD (peptic ulcer disease)   . Renal failure, acute (HCC) 11/08/2012    Blood pressure (!) 144/84, pulse 72.  Hypertension Patient with variable blood pressure, today was 144/84 in the office.  His home meter was compared to a [facility] RN reading several months ago and found to be within 10 points.  For the last 2 weeks his home average was 140/74.  Based on his age and challenges with ongoing dizziness, I am not going to add to his medication burden at this time.  His daughter will continue to check his home BP readings several times each week and can call should she note any low/elevated readings or patient feeling poorly.     [name] PharmD CPP [facility]

## 2016-08-25 NOTE — Assessment & Plan Note (Signed)
Patient with variable blood pressure, today was 144/84 in the office.  His home meter was compared to a [facility] RN reading several months ago and found to be within 10 points.  For the last 2 weeks his home average was 140/74.  Based on his age and challenges with ongoing dizziness, I am not going to add to his medication burden at this time.  His daughter will continue to check his home BP readings several times each week and can call should she note any low/elevated readings or patient feeling poorly.

## 2016-09-08 ENCOUNTER — Telehealth: Payer: Self-pay

## 2016-09-08 ENCOUNTER — Ambulatory Visit (INDEPENDENT_AMBULATORY_CARE_PROVIDER_SITE_OTHER): Payer: Medicare Other | Admitting: Pulmonary Disease

## 2016-09-08 ENCOUNTER — Encounter: Payer: Self-pay | Admitting: Pulmonary Disease

## 2016-09-08 VITALS — BP 130/68 | HR 72 | Ht 60.0 in | Wt 144.6 lb

## 2016-09-08 DIAGNOSIS — R911 Solitary pulmonary nodule: Secondary | ICD-10-CM

## 2016-09-08 NOTE — Telephone Encounter (Signed)
Pt daughter returning call and can be reached @ [phone].Caren Griffins

## 2016-09-08 NOTE — Progress Notes (Signed)
[name]    [id number]    October 26, 1926  Primary Care Physician:Wilson, [name], MD  Referring Physician: [name], MD [address], [place] [address]  Chief complaint:  Follow-up for  Abnormal CT scan with LUL opacity. COPD GOLD B (CAT score 13, 1 exacerbation over past year)  HPI: 83 y.o.malewith medical history significant of hypertension, hyperlipidemia, gout, depression, CAD, PUD, BPH, chronic combined systolic and diastolic CHF, carotid artery stenosis, AAA, anemia, PAF on Xarelto, who was admitted from 10/03/15-10/10/15 with HCAP pneumonia. He was treated with augmentin and xithromax. He was seen again in the emergency room in 9/15/174 with persistent cough. He had a chest x-ray and CT scan which redemonstrated the left upper lobe opacity. He was given another course of antibiotics, clindamycin for 7 days.  He had a repeat CT scan of the chest which shows near complete resolution of the left upper lobe opacity with residual scarring. Also noted are emphysema and stable aortic aneurysm.   Interim History: States that his breathing issues are stable. He is maintained on Pulmicort and Brovana. Complains of slight fatigue, occasional cough. Dyspnea on exertion is at baseline.  Outpatient Encounter Prescriptions as of 09/08/2016  Medication Sig  . allopurinol (ZYLOPRIM) 300 MG tablet Take 300 mg by mouth daily. To prevent gout  . arformoterol (BROVANA) 15 MCG/2ML NEBU Take 2 mLs (15 mcg total) by nebulization 2 (two) times daily.  [name] aspirin EC 81 MG tablet Take 1 tablet (81 mg total) by mouth daily.  [name] atorvastatin (LIPITOR) 40 MG tablet Take 1 tablet (40 mg total) by mouth daily at 6 PM.  . budesonide (PULMICORT) 0.5 MG/2ML nebulizer solution Take 2 mLs (0.5 mg total) by nebulization 2 (two) times daily.  . carvedilol (COREG) 6.25 MG tablet Take 1 tablet (6.25 mg total) by mouth 2 (two) times daily with a meal.  . esomeprazole (NEXIUM) 40 MG capsule  Take 1 capsule (40 mg total) by mouth 2 (two) times daily before a meal.  . isosorbide mononitrate (IMDUR) 30 MG 24 hr tablet Take 30 mg by mouth once daily (Patient taking differently: Take 30 mg by mouth daily. For control of heart pain)  . meclizine (ANTIVERT) 25 MG tablet   . NITROSTAT 0.4 MG SL tablet PLACE 1 TABLET UNDER THE TONGUE EVERY 5 MINUTES FOR 3 DOSES AS NEEDED FOR CHEST PAIN  . tamsulosin (FLOMAX) 0.4 MG CAPS capsule Take 0.4 mg by mouth daily. To improve bladder function  . dabigatran (PRADAXA) 75 MG CAPS capsule Take 1 capsule (75 mg total) by mouth every 12 (twelve) hours.  . [DISCONTINUED] polyethylene glycol (MIRALAX / GLYCOLAX) packet Take 17 g by mouth daily as needed.   No facility-administered encounter medications on file as of 09/08/2016.     Allergies as of 09/08/2016 - Review Complete 09/08/2016  Allergen Reaction Noted  . Levofloxacin Anaphylaxis and Other (See Comments) 07/31/2015  . Eliquis [apixaban] Itching and Swelling 07/31/2015  . Phenazopyridine hcl Other (See Comments) 12/15/2012  . Septra [sulfamethoxazole-trimethoprim] Nausea And Vomiting 12/15/2012  . Latex Rash 10/03/2015  . Tape Rash 10/03/2015    Past Medical History:  Diagnosis Date  . AAA (abdominal aortic aneurysm) (HCC)   . Anemia   . Asthma   . Carotid artery disease (HCC)   . CHF (congestive heart failure) (HCC)   . Coronary artery disease   . Depression   . Gout   . H. pylori infection   .  Hard of hearing   . Hiatal hernia   . Hyperplasia, prostate   . Hypertension   . PUD (peptic ulcer disease)   . Renal failure, acute (HCC) 11/08/2012    Past Surgical History:  Procedure Laterality Date  . CAROTID STENT INSERTION  2015  . CAROTID STENT INSERTION N/A 05/09/2013   Procedure: CAROTID STENT INSERTION;  Surgeon: Runell Gess, MD;  Location: [facility] CATH LAB;  Service: Cardiovascular;  Laterality: N/A;  . ESOPHAGOGASTRODUODENOSCOPY  06/13/2008,12/31/12  . LEFT HEART  CATHETERIZATION WITH CORONARY ANGIOGRAM N/A 05/04/2013   Procedure: LEFT HEART CATHETERIZATION WITH CORONARY ANGIOGRAM;  Surgeon: Peter M Swaziland, MD;  Location: [facility] CATH LAB;  Service: Cardiovascular;  Laterality: N/A;    Family History  Problem Relation Age of Onset  . Colon cancer Neg Hx     Social History   Social History  . Marital status: Married    Spouse name: san  . Number of children: 13  . Years of education: 4th grade   Occupational History  . retired    Social History Main Topics  . Smoking status: Former Smoker    Quit date: 02/17/2002  . Smokeless tobacco: Never Used     Comment: Quit seven years ago   . Alcohol use No  . Drug use: No  . Sexual activity: No   Other Topics Concern  . Not on file   Social History Narrative   Patient lives at home with his daughter   Patient is right handed    Patient drinks tea and coffee   Review of systems: Review of Systems  Constitutional: Negative for fever and chills.  HENT: Negative.   Eyes: Negative for blurred vision.  Respiratory: as per HPI  Cardiovascular: Negative for chest pain and palpitations.  Gastrointestinal: Negative for vomiting, diarrhea, blood per rectum. Genitourinary: Negative for dysuria, urgency, frequency and hematuria.  Musculoskeletal: Negative for myalgias, back pain and joint pain.  Skin: Negative for itching and rash.  Neurological: Negative for dizziness, tremors, focal weakness, seizures and loss of consciousness.  Endo/Heme/Allergies: Negative for environmental allergies.  Psychiatric/Behavioral: Negative for depression, suicidal ideas and hallucinations.  All other systems reviewed and are negative.  Physical Exam: Blood pressure 130/68, pulse 72, height 5' (1.524 m), weight 144 lb 9.6 oz (65.6 kg), SpO2 99 %. Gen:      No acute distress HEENT:  EOMI, sclera anicteric Neck:     No masses; no thyromegaly Lungs:    Clear to auscultation bilaterally; normal respiratory effort CV:          Regular rate and rhythm; no murmurs Abd:      + bowel sounds; soft, non-tender; no palpable masses, no distension Ext:    No edema; adequate peripheral perfusion Skin:      Warm and dry; no rash Neuro: alert and oriented x 3 Psych: normal mood and affect   Data Reviewed: CT scan 07/31/15- Stable aneurysm of aorta. No pulmonary mas or infiltrate. Emphysema CT scan 10/27/15- LUL mass.  CT scan 01/29/16-near-complete resolution of the left upper lobe mass with residual scarring. Centrilobular emphysema again demonstrated. Thoracic aortic aneurysm seen. Images reviewed  PFTs 01/02/16 FVC 2.42 [113%) FEV1 1.69 [123%] F/F 70 Mild obstructive airway disease. Patient unable to perform DLCO and lung volumes.  Assessment:  #1 COPD GOLD B PFTs show mild obstructive airway disease but the CT scan does show emphysema, He is stable on Brovana and Pulmicort nebs and we will continue the same.  #2 Abnormal  CT scan. The left upper lobe infiltrate has nearly completely resolved with some residual opacity which is likely scarring. In retrospect the left upper lobe opacity was likely secondary to pneumonia. There is no evidence of malignancy.  We will repeat a CT scan in 6 months time to follow the residual opacity to resolution.  Plan/Recommendations: - Continue Brovana, pulmicort - Follow up CT in 6 months.  Chilton Greathouse MD [facility] Pager [phone] 09/08/2016, 10:25 AM  CC: [name], MD

## 2016-09-08 NOTE — Telephone Encounter (Signed)
Spoke with pt's daughter Yvone Neu (dpr on file), aware of recs.  Nothing further needed.

## 2016-09-08 NOTE — Telephone Encounter (Signed)
Pt had OV with PM today.  PM would like for pt to have CT in 90mo. Pt had already left our office before I could make him aware of this.  CT has been order. Lm to make pt aware.

## 2016-09-08 NOTE — Patient Instructions (Addendum)
Continue using year inhalers as prescribed and follow up in 6 months. We will order a follow up CT in 6 months

## 2016-09-12 ENCOUNTER — Ambulatory Visit (INDEPENDENT_AMBULATORY_CARE_PROVIDER_SITE_OTHER): Payer: Medicare Other | Admitting: Neurology

## 2016-09-12 ENCOUNTER — Encounter: Payer: Self-pay | Admitting: Neurology

## 2016-09-12 VITALS — BP 138/81 | HR 101 | Ht 60.0 in | Wt 146.8 lb

## 2016-09-12 DIAGNOSIS — I63411 Cerebral infarction due to embolism of right middle cerebral artery: Secondary | ICD-10-CM

## 2016-09-12 NOTE — Progress Notes (Signed)
[facility] [address] [place]. [place] [address] [phone]       OFFICE FOLLOW-UP NOTE  [name] Date of Birth:  [birth year] Medical Record Number:  [id number]   HPI: [name] is a13 year male from [place] originally who is seen today for first office follow-up visit following hospital admission for stroke in June 2018. He is accompanied by his daughter as well as a [place] language interpreter who provide most of the history. I also reviewed electronic medical records and personally reviewed imaging films. [name] is a 83 y.o. male with multiple medical problems including atrial fibrillation on Xarelto who presents with left-sided weakness and decreased responsiveness. He went to bed normal at 10 PM, as wife reports that when he woke up he was unable to lift "either arm." His brought into the emergency room where he was activated as code stroke. On my exam, he was able to hold his right arm up without difficulty but his left arm had significant weakness. He was taken for a stat CT perfusion which did not show any ischemic penumbra, and CT angiogram which shows right M1 stenosis without occlusion. MRI scan of the brain showed a small region of acute infarction affecting the gyral surface of the precentral gyrus with extensive chronic small vessel ischemic changes as well as old hemosiderin deposition in the right lateral thalamus posterior limb internal capsule and left frontal cortical subcortical region. Transthoracic echo showed ejection fraction 45-50% with moderate pulmonary hypertension. LDL cholesterol is 42 mg percent. Hemoglobin A1c was 6.4. Patient was previously on Xarelto for atrial fibrillation this was switched to Pradaxa. Patient is doing well. Is staying at home with his family. He is able to ambulate independently. The family feels that he is recovered completely back to his baseline. Patient is quite active and eats healthy. His blood  pressure is well controlled today it is 138/81. He is tolerating Lipitor well without muscle aches and pains. He in fact has no complaints today when asked through the interpreter.  ROS:   14 system review of systems is positive for  dizziness, depression, cough, hearing loss, fatigue and all other systems negative  PMH:  Past Medical History:  Diagnosis Date  . AAA (abdominal aortic aneurysm) (HCC)   . Anemia   . Asthma   . Carotid artery disease (HCC)   . CHF (congestive heart failure) (HCC)   . Coronary artery disease   . Depression   . Gout   . H. pylori infection   . Hard of hearing   . Hiatal hernia   . Hyperplasia, prostate   . Hypertension   . PUD (peptic ulcer disease)   . Renal failure, acute (HCC) 11/08/2012    Social History:  Social History   Social History  . Marital status: Married    Spouse name: san  . Number of children: 13  . Years of education: 4th grade   Occupational History  . retired    Social History Main Topics  . Smoking status: Former Smoker    Quit date: 02/17/2002  . Smokeless tobacco: Never Used     Comment: Quit seven years ago   . Alcohol use No  . Drug use: No  . Sexual activity: No   Other Topics Concern  . Not on file   Social History Narrative   Patient lives at home with his daughter   Patient is right handed    Patient drinks tea and coffee  Medications:   Current Outpatient Prescriptions on File Prior to Visit  Medication Sig Dispense Refill  . allopurinol (ZYLOPRIM) 300 MG tablet Take 300 mg by mouth daily. To prevent gout    . arformoterol (BROVANA) 15 MCG/2ML NEBU Take 2 mLs (15 mcg total) by nebulization 2 (two) times daily. 120 mL 2  . aspirin EC 81 MG tablet Take 1 tablet (81 mg total) by mouth daily. 90 tablet 3  . atorvastatin (LIPITOR) 40 MG tablet Take 1 tablet (40 mg total) by mouth daily at 6 PM. 30 tablet 5  . budesonide (PULMICORT) 0.5 MG/2ML nebulizer solution Take 2 mLs (0.5 mg total) by nebulization  2 (two) times daily. 2 mL 12  . carvedilol (COREG) 6.25 MG tablet Take 1 tablet (6.25 mg total) by mouth 2 (two) times daily with a meal. 60 tablet 6  . dabigatran (PRADAXA) 75 MG CAPS capsule Take 1 capsule (75 mg total) by mouth every 12 (twelve) hours. 60 capsule 0  . esomeprazole (NEXIUM) 40 MG capsule Take 1 capsule (40 mg total) by mouth 2 (two) times daily before a meal.    . isosorbide mononitrate (IMDUR) 30 MG 24 hr tablet Take 30 mg by mouth once daily (Patient taking differently: Take 30 mg by mouth daily. For control of heart pain) 15 tablet 9  . meclizine (ANTIVERT) 25 MG tablet     . NITROSTAT 0.4 MG SL tablet PLACE 1 TABLET UNDER THE TONGUE EVERY 5 MINUTES FOR 3 DOSES AS NEEDED FOR CHEST PAIN 25 tablet 5  . tamsulosin (FLOMAX) 0.4 MG CAPS capsule Take 0.4 mg by mouth daily. To improve bladder function    . [DISCONTINUED] apixaban (ELIQUIS) 2.5 MG TABS tablet Take 1 tablet (2.5 mg total) by mouth 2 (two) times daily. 60 tablet 3  . [DISCONTINUED] pantoprazole (PROTONIX) 40 MG tablet Take 1 tablet (40 mg total) by mouth 2 (two) times daily. 60 tablet 5   No current facility-administered medications on file prior to visit.     Allergies:   Allergies  Allergen Reactions  . Levofloxacin Anaphylaxis and Other (See Comments)    Patient told his daughter that it made him "feel worse than he already did" (overall) Headache also (has refused to take anymore)  . Eliquis [Apixaban] Itching and Swelling  . Phenazopyridine Hcl Other (See Comments)    Unknown reaction  . Septra [Sulfamethoxazole-Trimethoprim] Nausea And Vomiting    GI Upset  . Latex Rash  . Tape Rash    Physical Exam General: Frail cachectic looking elderly Asian male, seated, in no evident distress Head: head normocephalic and atraumatic.  Neck: supple with no carotid or supraclavicular bruits Cardiovascular: regular rate and rhythm, no murmurs Musculoskeletal: no deformity Skin:  no rash/petichiae Vascular:   Normal pulses all extremities Vitals:   09/12/16 1013  BP: 138/81  Pulse: (!) 101   Neurologic Exam Mental Status: Awake and fully alert. Mental status exam Limited due to language barrier but follows commands appropriately and speaks in his own language fluently.  Cranial Nerves: Fundoscopic exam reveals sharp disc margins. Pupils equal, briskly reactive to light. Extraocular movements full without nystagmus. Visual fields full to confrontation. Hearing intact. Facial sensation intact. Face, tongue, palate moves normally and symmetrically.  Motor: Normal bulk and tone. Normal strength in all tested extremity muscles. Sensory.: intact to touch ,pinprick .position and vibratory sensation.  Coordination: Rapid alternating movements normal in all extremities. Finger-to-nose and heel-to-shin performed accurately bilaterally. Gait and Station: Arises from chair without difficulty.  Stance is slightly stooped. Gait demonstrates normal stride length and balance . Not able to heel, toe and tandem walk      Reflexes: 1+ and symmetric. Toes downgoing.      ASSESSMENT: 83 year old Asian male with embolic right frontal MCA branch infarct secondary to atrial fibrillation despite being on anticoagulation with Xarelto. He seems to have made a good functional recovery    PLAN: I had a long d/w patient , his daughter and [place] language interpreter about his recent stroke, atrial fibrillation,risk for recurrent stroke/TIAs, personally independently reviewed imaging studies and stroke evaluation results and answered questions.Continue Pradaxa (dabigatran) twice a day  for secondary stroke prevention and maintain strict control of hypertension with blood pressure goal below 130/90, diabetes with hemoglobin A1c goal below 6.5% and lipids with LDL cholesterol goal below 70 mg/dL. I also advised the patient to eat a healthy diet with plenty of whole grains, cereals, fruits and vegetables, exercise regularly and  maintain ideal body weight Followup in the future with my nurse practitioner in 6 months or call earlier if necessary Greater than 50% of time during this 25 minute visit was spent on counseling,explanation of diagnosis of embolic stroke, atrial fibrillation, planning of further management, discussion with patient and family and coordination of care Delia Heady, MD  [facility] [address] [place], [place] [address]  Phone [phone] Fax [phone] Note: This document was prepared with digital dictation and possible smart phrase technology. Any transcriptional errors that result from this process are unintentional

## 2016-09-12 NOTE — Patient Instructions (Signed)
I had a long d/w patient , his daughter and [place] language interpreter about his recent stroke, atrial fibrillation,risk for recurrent stroke/TIAs, personally independently reviewed imaging studies and stroke evaluation results and answered questions.Continue Pradaxa (dabigatran) twice a day  for secondary stroke prevention and maintain strict control of hypertension with blood pressure goal below 130/90, diabetes with hemoglobin A1c goal below 6.5% and lipids with LDL cholesterol goal below 70 mg/dL. I also advised the patient to eat a healthy diet with plenty of whole grains, cereals, fruits and vegetables, exercise regularly and maintain ideal body weight Followup in the future with my nurse practitioner in 6 months or call earlier if necessary Stroke Prevention Some medical conditions and behaviors are associated with an increased chance of having a stroke. You may prevent a stroke by making healthy choices and managing medical conditions. How can I reduce my risk of having a stroke?  Stay physically active. Get at least 30 minutes of activity on most or all days.  Do not smoke. It may also be helpful to avoid exposure to secondhand smoke.  Limit alcohol use. Moderate alcohol use is considered to be: ? No more than 2 drinks per day for men. ? No more than 1 drink per day for nonpregnant women.  Eat healthy foods. This involves: ? Eating 5 or more servings of fruits and vegetables a day. ? Making dietary changes that address high blood pressure (hypertension), high cholesterol, diabetes, or obesity.  Manage your cholesterol levels. ? Making food choices that are high in fiber and low in saturated fat, trans fat, and cholesterol may control cholesterol levels. ? Take any prescribed medicines to control cholesterol as directed by your health care provider.  Manage your diabetes. ? Controlling your carbohydrate and sugar intake is recommended to manage diabetes. ? Take any prescribed medicines  to control diabetes as directed by your health care provider.  Control your hypertension. ? Making food choices that are low in salt (sodium), saturated fat, trans fat, and cholesterol is recommended to manage hypertension. ? Ask your health care provider if you need treatment to lower your blood pressure. Take any prescribed medicines to control hypertension as directed by your health care provider. ? If you are 70-39 years of age, have your blood pressure checked every 3-5 years. If you are 73 years of age or older, have your blood pressure checked every year.  Maintain a healthy weight. ? Reducing calorie intake and making food choices that are low in sodium, saturated fat, trans fat, and cholesterol are recommended to manage weight.  Stop drug abuse.  Avoid taking birth control pills. ? Talk to your health care provider about the risks of taking birth control pills if you are over 39 years old, smoke, get migraines, or have ever had a blood clot.  Get evaluated for sleep disorders (sleep apnea). ? Talk to your health care provider about getting a sleep evaluation if you snore a lot or have excessive sleepiness.  Take medicines only as directed by your health care provider. ? For some people, aspirin or blood thinners (anticoagulants) are helpful in reducing the risk of forming abnormal blood clots that can lead to stroke. If you have the irregular heart rhythm of atrial fibrillation, you should be on a blood thinner unless there is a good reason you cannot take them. ? Understand all your medicine instructions.  Make sure that other conditions (such as anemia or atherosclerosis) are addressed. Get help right away if:  You have  sudden weakness or numbness of the face, arm, or leg, especially on one side of the body.  Your face or eyelid droops to one side.  You have sudden confusion.  You have trouble speaking (aphasia) or understanding.  You have sudden trouble seeing in one or  both eyes.  You have sudden trouble walking.  You have dizziness.  You have a loss of balance or coordination.  You have a sudden, severe headache with no known cause.  You have new chest pain or an irregular heartbeat. Any of these symptoms may represent a serious problem that is an emergency. Do not wait to see if the symptoms will go away. Get medical help at once. Call your local emergency services (911 in [place] Do not drive yourself to the hospital. This information is not intended to replace advice given to you by your health care provider. Make sure you discuss any questions you have with your health care provider. Document Released: 03/06/2004 Document Revised: 07/05/2015 Document Reviewed: 07/30/2012 Elsevier Interactive Patient Education  2017 [employer].

## 2016-09-24 ENCOUNTER — Ambulatory Visit: Payer: Medicaid Other | Admitting: Neurology

## 2016-10-08 ENCOUNTER — Emergency Department (HOSPITAL_COMMUNITY): Payer: Medicare Other

## 2016-10-08 ENCOUNTER — Encounter (HOSPITAL_COMMUNITY): Payer: Self-pay | Admitting: Emergency Medicine

## 2016-10-08 DIAGNOSIS — I6340 Cerebral infarction due to embolism of unspecified cerebral artery: Secondary | ICD-10-CM | POA: Diagnosis not present

## 2016-10-08 DIAGNOSIS — N400 Enlarged prostate without lower urinary tract symptoms: Secondary | ICD-10-CM | POA: Diagnosis not present

## 2016-10-08 DIAGNOSIS — I739 Peripheral vascular disease, unspecified: Secondary | ICD-10-CM | POA: Diagnosis present

## 2016-10-08 DIAGNOSIS — I5022 Chronic systolic (congestive) heart failure: Secondary | ICD-10-CM | POA: Diagnosis present

## 2016-10-08 DIAGNOSIS — E871 Hypo-osmolality and hyponatremia: Secondary | ICD-10-CM | POA: Diagnosis present

## 2016-10-08 DIAGNOSIS — R531 Weakness: Secondary | ICD-10-CM | POA: Diagnosis present

## 2016-10-08 DIAGNOSIS — R42 Dizziness and giddiness: Secondary | ICD-10-CM | POA: Diagnosis present

## 2016-10-08 DIAGNOSIS — Z8679 Personal history of other diseases of the circulatory system: Secondary | ICD-10-CM | POA: Diagnosis not present

## 2016-10-08 DIAGNOSIS — Z8711 Personal history of peptic ulcer disease: Secondary | ICD-10-CM

## 2016-10-08 DIAGNOSIS — N183 Chronic kidney disease, stage 3 (moderate): Secondary | ICD-10-CM | POA: Diagnosis present

## 2016-10-08 DIAGNOSIS — I714 Abdominal aortic aneurysm, without rupture: Secondary | ICD-10-CM | POA: Diagnosis present

## 2016-10-08 DIAGNOSIS — E860 Dehydration: Secondary | ICD-10-CM | POA: Diagnosis not present

## 2016-10-08 DIAGNOSIS — Z7982 Long term (current) use of aspirin: Secondary | ICD-10-CM

## 2016-10-08 DIAGNOSIS — Z95828 Presence of other vascular implants and grafts: Secondary | ICD-10-CM

## 2016-10-08 DIAGNOSIS — I130 Hypertensive heart and chronic kidney disease with heart failure and stage 1 through stage 4 chronic kidney disease, or unspecified chronic kidney disease: Secondary | ICD-10-CM | POA: Diagnosis present

## 2016-10-08 DIAGNOSIS — I6529 Occlusion and stenosis of unspecified carotid artery: Secondary | ICD-10-CM | POA: Diagnosis present

## 2016-10-08 DIAGNOSIS — E785 Hyperlipidemia, unspecified: Secondary | ICD-10-CM | POA: Diagnosis not present

## 2016-10-08 DIAGNOSIS — I959 Hypotension, unspecified: Secondary | ICD-10-CM | POA: Diagnosis not present

## 2016-10-08 DIAGNOSIS — R079 Chest pain, unspecified: Secondary | ICD-10-CM | POA: Diagnosis not present

## 2016-10-08 DIAGNOSIS — G9341 Metabolic encephalopathy: Secondary | ICD-10-CM | POA: Diagnosis not present

## 2016-10-08 DIAGNOSIS — R627 Adult failure to thrive: Secondary | ICD-10-CM | POA: Diagnosis not present

## 2016-10-08 DIAGNOSIS — H9190 Unspecified hearing loss, unspecified ear: Secondary | ICD-10-CM | POA: Diagnosis not present

## 2016-10-08 DIAGNOSIS — Z882 Allergy status to sulfonamides status: Secondary | ICD-10-CM

## 2016-10-08 DIAGNOSIS — I480 Paroxysmal atrial fibrillation: Secondary | ICD-10-CM | POA: Diagnosis not present

## 2016-10-08 DIAGNOSIS — Z7901 Long term (current) use of anticoagulants: Secondary | ICD-10-CM

## 2016-10-08 DIAGNOSIS — J441 Chronic obstructive pulmonary disease with (acute) exacerbation: Secondary | ICD-10-CM | POA: Diagnosis not present

## 2016-10-08 DIAGNOSIS — Z91048 Other nonmedicinal substance allergy status: Secondary | ICD-10-CM

## 2016-10-08 DIAGNOSIS — Z888 Allergy status to other drugs, medicaments and biological substances status: Secondary | ICD-10-CM

## 2016-10-08 DIAGNOSIS — M109 Gout, unspecified: Secondary | ICD-10-CM | POA: Diagnosis present

## 2016-10-08 DIAGNOSIS — R29702 NIHSS score 2: Secondary | ICD-10-CM | POA: Diagnosis not present

## 2016-10-08 DIAGNOSIS — I2510 Atherosclerotic heart disease of native coronary artery without angina pectoris: Secondary | ICD-10-CM | POA: Diagnosis not present

## 2016-10-08 DIAGNOSIS — Z7951 Long term (current) use of inhaled steroids: Secondary | ICD-10-CM

## 2016-10-08 DIAGNOSIS — N179 Acute kidney failure, unspecified: Secondary | ICD-10-CM | POA: Diagnosis present

## 2016-10-08 DIAGNOSIS — Z79899 Other long term (current) drug therapy: Secondary | ICD-10-CM

## 2016-10-08 DIAGNOSIS — Z91040 Latex allergy status: Secondary | ICD-10-CM

## 2016-10-08 DIAGNOSIS — Z87891 Personal history of nicotine dependence: Secondary | ICD-10-CM

## 2016-10-08 DIAGNOSIS — Z881 Allergy status to other antibiotic agents status: Secondary | ICD-10-CM

## 2016-10-08 LAB — COMPREHENSIVE METABOLIC PANEL
ALK PHOS: 74 U/L (ref 38–126)
ALT: 13 U/L — ABNORMAL LOW (ref 17–63)
ANION GAP: 8 (ref 5–15)
AST: 21 U/L (ref 15–41)
Albumin: 3.4 g/dL — ABNORMAL LOW (ref 3.5–5.0)
BUN: 18 mg/dL (ref 6–20)
CALCIUM: 8.8 mg/dL — AB (ref 8.9–10.3)
CO2: 22 mmol/L (ref 22–32)
CREATININE: 1.8 mg/dL — AB (ref 0.61–1.24)
Chloride: 94 mmol/L — ABNORMAL LOW (ref 101–111)
GFR calc Af Amer: 37 mL/min — ABNORMAL LOW (ref >60)
GFR calc non Af Amer: 32 mL/min — ABNORMAL LOW (ref >60)
GLUCOSE: 101 mg/dL — AB (ref 65–99)
Potassium: 4.6 mmol/L (ref 3.5–5.1)
Sodium: 124 mmol/L — ABNORMAL LOW (ref 135–145)
TOTAL PROTEIN: 7.8 g/dL (ref 6.5–8.1)
Total Bilirubin: 1.4 mg/dL — ABNORMAL HIGH (ref 0.3–1.2)

## 2016-10-08 LAB — URINALYSIS, ROUTINE W REFLEX MICROSCOPIC
Bilirubin Urine: NEGATIVE
Glucose, UA: NEGATIVE mg/dL
HGB URINE DIPSTICK: NEGATIVE
KETONES UR: NEGATIVE mg/dL
Leukocytes, UA: NEGATIVE
Nitrite: NEGATIVE
PROTEIN: NEGATIVE mg/dL
Specific Gravity, Urine: 1.012 (ref 1.005–1.030)
pH: 6 (ref 5.0–8.0)

## 2016-10-08 LAB — I-STAT CG4 LACTIC ACID, ED: LACTIC ACID, VENOUS: 0.87 mmol/L (ref 0.5–1.9)

## 2016-10-08 LAB — I-STAT CHEM 8, ED
BUN: 22 mg/dL — AB (ref 6–20)
CHLORIDE: 93 mmol/L — AB (ref 101–111)
Calcium, Ion: 1.15 mmol/L (ref 1.15–1.40)
Creatinine, Ser: 1.7 mg/dL — ABNORMAL HIGH (ref 0.61–1.24)
Glucose, Bld: 98 mg/dL (ref 65–99)
HEMATOCRIT: 30 % — AB (ref 39.0–52.0)
Hemoglobin: 10.2 g/dL — ABNORMAL LOW (ref 13.0–17.0)
POTASSIUM: 4.6 mmol/L (ref 3.5–5.1)
SODIUM: 127 mmol/L — AB (ref 135–145)
TCO2: 24 mmol/L (ref 22–32)

## 2016-10-08 LAB — CBC
HCT: 29.1 % — ABNORMAL LOW (ref 39.0–52.0)
HEMOGLOBIN: 9.9 g/dL — AB (ref 13.0–17.0)
MCH: 31.8 pg (ref 26.0–34.0)
MCHC: 34 g/dL (ref 30.0–36.0)
MCV: 93.6 fL (ref 78.0–100.0)
PLATELETS: 173 10*3/uL (ref 150–400)
RBC: 3.11 MIL/uL — ABNORMAL LOW (ref 4.22–5.81)
RDW: 15.2 % (ref 11.5–15.5)
WBC: 6.8 10*3/uL (ref 4.0–10.5)

## 2016-10-08 NOTE — ED Notes (Signed)
Patient and wife aware of need for urine sample, but do not feel comfortable having patient stand in triage.   Made wife aware if patient asks to use the restroom, patient to ask for assistance from lobby staff for toileting in urinal.

## 2016-10-08 NOTE — ED Notes (Signed)
Pt presents to ED with wife.  Pt's wife refused interpreter pad.  Pt's wife states pt has been weak, dizziness, decrease in appetite, intermittent cough and headache x 3+ days.  No facial droop noted, but patient not following wife's commands, she states he has not been speaking much to her the past three days, and she has not seen him use the restroom much.

## 2016-10-09 ENCOUNTER — Encounter (HOSPITAL_COMMUNITY): Payer: Self-pay | Admitting: *Deleted

## 2016-10-09 ENCOUNTER — Inpatient Hospital Stay (HOSPITAL_COMMUNITY): Payer: Medicare Other

## 2016-10-09 ENCOUNTER — Inpatient Hospital Stay (HOSPITAL_COMMUNITY)
Admission: EM | Admit: 2016-10-09 | Discharge: 2016-10-12 | DRG: 064 | Disposition: A | Discharge status: Home Health | Payer: Medicare Other | Attending: Family Medicine | Admitting: Family Medicine

## 2016-10-09 DIAGNOSIS — J449 Chronic obstructive pulmonary disease, unspecified: Secondary | ICD-10-CM | POA: Diagnosis not present

## 2016-10-09 DIAGNOSIS — R42 Dizziness and giddiness: Secondary | ICD-10-CM

## 2016-10-09 DIAGNOSIS — E871 Hypo-osmolality and hyponatremia: Secondary | ICD-10-CM | POA: Diagnosis not present

## 2016-10-09 DIAGNOSIS — N189 Chronic kidney disease, unspecified: Secondary | ICD-10-CM

## 2016-10-09 DIAGNOSIS — R51 Headache: Secondary | ICD-10-CM

## 2016-10-09 DIAGNOSIS — R531 Weakness: Secondary | ICD-10-CM | POA: Diagnosis not present

## 2016-10-09 DIAGNOSIS — E7800 Pure hypercholesterolemia, unspecified: Secondary | ICD-10-CM

## 2016-10-09 DIAGNOSIS — I10 Essential (primary) hypertension: Secondary | ICD-10-CM | POA: Diagnosis present

## 2016-10-09 DIAGNOSIS — G9341 Metabolic encephalopathy: Secondary | ICD-10-CM | POA: Diagnosis not present

## 2016-10-09 DIAGNOSIS — I480 Paroxysmal atrial fibrillation: Secondary | ICD-10-CM | POA: Diagnosis present

## 2016-10-09 DIAGNOSIS — I63511 Cerebral infarction due to unspecified occlusion or stenosis of right middle cerebral artery: Secondary | ICD-10-CM | POA: Insufficient documentation

## 2016-10-09 DIAGNOSIS — I6340 Cerebral infarction due to embolism of unspecified cerebral artery: Secondary | ICD-10-CM | POA: Diagnosis not present

## 2016-10-09 DIAGNOSIS — I509 Heart failure, unspecified: Secondary | ICD-10-CM

## 2016-10-09 DIAGNOSIS — I6330 Cerebral infarction due to thrombosis of unspecified cerebral artery: Secondary | ICD-10-CM

## 2016-10-09 DIAGNOSIS — E785 Hyperlipidemia, unspecified: Secondary | ICD-10-CM | POA: Diagnosis present

## 2016-10-09 DIAGNOSIS — N179 Acute kidney failure, unspecified: Secondary | ICD-10-CM | POA: Diagnosis not present

## 2016-10-09 LAB — BASIC METABOLIC PANEL
ANION GAP: 8 (ref 5–15)
ANION GAP: 8 (ref 5–15)
BUN: 17 mg/dL (ref 6–20)
BUN: 16 mg/dL (ref 6–20)
CALCIUM: 8.6 mg/dL — AB (ref 8.9–10.3)
CALCIUM: 8.6 mg/dL — AB (ref 8.9–10.3)
CHLORIDE: 99 mmol/L — AB (ref 101–111)
CHLORIDE: 103 mmol/L (ref 101–111)
CO2: 21 mmol/L — AB (ref 22–32)
CO2: 20 mmol/L — AB (ref 22–32)
Creatinine, Ser: 1.56 mg/dL — ABNORMAL HIGH (ref 0.61–1.24)
Creatinine, Ser: 1.43 mg/dL — ABNORMAL HIGH (ref 0.61–1.24)
GFR calc non Af Amer: 38 mL/min — ABNORMAL LOW (ref >60)
GFR calc non Af Amer: 42 mL/min — ABNORMAL LOW (ref >60)
GFR, EST AFRICAN AMERICAN: 44 mL/min — AB (ref >60)
GFR, EST AFRICAN AMERICAN: 49 mL/min — AB (ref >60)
Glucose, Bld: 89 mg/dL (ref 65–99)
Glucose, Bld: 96 mg/dL (ref 65–99)
Potassium: 3.9 mmol/L (ref 3.5–5.1)
Potassium: 3.8 mmol/L (ref 3.5–5.1)
Sodium: 128 mmol/L — ABNORMAL LOW (ref 135–145)
Sodium: 131 mmol/L — ABNORMAL LOW (ref 135–145)

## 2016-10-09 LAB — TROPONIN I: Troponin I: <0.03 ng/mL (ref <0.03)

## 2016-10-09 LAB — RAPID URINE DRUG SCREEN, HOSP PERFORMED
AMPHETAMINES: NOT DETECTED
BARBITURATES: NOT DETECTED
BENZODIAZEPINES: NOT DETECTED
Cocaine: NOT DETECTED
Opiates: NOT DETECTED
Tetrahydrocannabinol: NOT DETECTED

## 2016-10-09 LAB — CK: Total CK: 82 U/L (ref 49–397)

## 2016-10-09 LAB — SEDIMENTATION RATE: SED RATE: 108 mm/h — AB (ref 0–16)

## 2016-10-09 LAB — I-STAT TROPONIN, ED: Troponin i, poc: 0 ng/mL (ref 0.00–0.08)

## 2016-10-09 LAB — I-STAT CG4 LACTIC ACID, ED: Lactic Acid, Venous: 1.42 mmol/L (ref 0.5–1.9)

## 2016-10-09 LAB — ETHANOL: Alcohol, Ethyl (B): <5 mg/dL (ref <5)

## 2016-10-09 LAB — LIPASE, BLOOD: LIPASE: 31 U/L (ref 11–51)

## 2016-10-09 LAB — OCCULT BLOOD, POC DEVICE: FECAL OCCULT BLD: NEGATIVE

## 2016-10-09 LAB — TSH: TSH: 1.574 u[IU]/mL (ref 0.350–4.500)

## 2016-10-09 MED ORDER — BUDESONIDE 0.5 MG/2ML IN SUSP
0.5000 mg | Freq: Two times a day (BID) | RESPIRATORY_TRACT | Status: DC
  Administered 2016-10-09 – 2016-10-11 (×6): 0.5 mg via RESPIRATORY_TRACT
  Filled 2016-10-09 (×8): qty 2

## 2016-10-09 MED ORDER — CARVEDILOL 12.5 MG PO TABS
6.2500 mg | ORAL_TABLET | Freq: Two times a day (BID) | ORAL | Status: DC
  Administered 2016-10-09 – 2016-10-10 (×3): 6.25 mg via ORAL
  Filled 2016-10-09 (×3): qty 1

## 2016-10-09 MED ORDER — ENOXAPARIN SODIUM 40 MG/0.4ML ~~LOC~~ SOLN: 40.0000 mg | SUBCUTANEOUS | Status: DC

## 2016-10-09 MED ORDER — GUAIFENESIN 100 MG/5ML PO SOLN
5.0000 mL | ORAL | Status: DC | PRN
  Administered 2016-10-09 – 2016-10-11 (×3): 100 mg via ORAL
  Filled 2016-10-09 (×3): qty 5

## 2016-10-09 MED ORDER — ACETAMINOPHEN 325 MG PO TABS: 650.0000 mg | ORAL_TABLET | Freq: Four times a day (QID) | ORAL | Status: DC | PRN

## 2016-10-09 MED ORDER — MECLIZINE HCL 25 MG PO TABS
25.0000 mg | ORAL_TABLET | Freq: Two times a day (BID) | ORAL | Status: DC | PRN
  Administered 2016-10-09 – 2016-10-12 (×2): 25 mg via ORAL
  Filled 2016-10-09 (×2): qty 1

## 2016-10-09 MED ORDER — ACETAMINOPHEN 650 MG RE SUPP: 650.0000 mg | Freq: Four times a day (QID) | RECTAL | Status: DC | PRN

## 2016-10-09 MED ORDER — ISOSORBIDE MONONITRATE ER 30 MG PO TB24
30.0000 mg | ORAL_TABLET | Freq: Every day | ORAL | Status: DC
  Administered 2016-10-09 – 2016-10-10 (×2): 30 mg via ORAL
  Filled 2016-10-09 (×2): qty 1

## 2016-10-09 MED ORDER — NITROGLYCERIN 0.4 MG SL SUBL: 0.4000 mg | SUBLINGUAL_TABLET | SUBLINGUAL | Status: DC | PRN

## 2016-10-09 MED ORDER — ONDANSETRON HCL 4 MG PO TABS: 4.0000 mg | ORAL_TABLET | Freq: Four times a day (QID) | ORAL | Status: DC | PRN

## 2016-10-09 MED ORDER — METOCLOPRAMIDE HCL 5 MG/ML IJ SOLN
10.0000 mg | Freq: Once | INTRAMUSCULAR | Status: AC
  Administered 2016-10-09: 10 mg via INTRAVENOUS
  Filled 2016-10-09: qty 2

## 2016-10-09 MED ORDER — ONDANSETRON HCL 4 MG/2ML IJ SOLN: 4.0000 mg | Freq: Four times a day (QID) | INTRAMUSCULAR | Status: DC | PRN

## 2016-10-09 MED ORDER — ALLOPURINOL 300 MG PO TABS
300.0000 mg | ORAL_TABLET | Freq: Every day | ORAL | Status: DC
  Administered 2016-10-09 – 2016-10-12 (×4): 300 mg via ORAL
  Filled 2016-10-09 (×4): qty 1

## 2016-10-09 MED ORDER — ARFORMOTEROL TARTRATE 15 MCG/2ML IN NEBU
15.0000 ug | INHALATION_SOLUTION | Freq: Two times a day (BID) | RESPIRATORY_TRACT | Status: DC
  Administered 2016-10-09 – 2016-10-11 (×6): 15 ug via RESPIRATORY_TRACT
  Filled 2016-10-09 (×8): qty 2

## 2016-10-09 MED ORDER — ORAL CARE MOUTH RINSE
15.0000 mL | Freq: Two times a day (BID) | OROMUCOSAL | Status: DC
  Administered 2016-10-09 – 2016-10-12 (×6): 15 mL via OROMUCOSAL

## 2016-10-09 MED ORDER — TAMSULOSIN HCL 0.4 MG PO CAPS
0.4000 mg | ORAL_CAPSULE | Freq: Every day | ORAL | Status: DC
  Administered 2016-10-09 – 2016-10-12 (×4): 0.4 mg via ORAL
  Filled 2016-10-09 (×4): qty 1

## 2016-10-09 MED ORDER — SODIUM CHLORIDE 0.9 % IV BOLUS (SEPSIS)
1000.0000 mL | Freq: Once | INTRAVENOUS | Status: AC
  Administered 2016-10-09: 1000 mL via INTRAVENOUS

## 2016-10-09 MED ORDER — IPRATROPIUM-ALBUTEROL 0.5-2.5 (3) MG/3ML IN SOLN
3.0000 mL | RESPIRATORY_TRACT | Status: DC | PRN
Start: 2016-10-09 — End: 2016-10-12

## 2016-10-09 MED ORDER — ATORVASTATIN CALCIUM 40 MG PO TABS
40.0000 mg | ORAL_TABLET | Freq: Every day | ORAL | Status: DC
  Administered 2016-10-09 – 2016-10-11 (×3): 40 mg via ORAL
  Filled 2016-10-09 (×3): qty 1

## 2016-10-09 MED ORDER — PANTOPRAZOLE SODIUM 40 MG PO TBEC
40.0000 mg | DELAYED_RELEASE_TABLET | Freq: Two times a day (BID) | ORAL | Status: DC
  Administered 2016-10-09 – 2016-10-12 (×7): 40 mg via ORAL
  Filled 2016-10-09 (×7): qty 1

## 2016-10-09 MED ORDER — ASPIRIN EC 81 MG PO TBEC
81.0000 mg | DELAYED_RELEASE_TABLET | Freq: Every day | ORAL | Status: DC
  Administered 2016-10-09 – 2016-10-12 (×4): 81 mg via ORAL
  Filled 2016-10-09 (×4): qty 1

## 2016-10-09 MED ORDER — SODIUM CHLORIDE 0.9 % IV SOLN
INTRAVENOUS | Status: DC
  Administered 2016-10-09: 06:00:00 via INTRAVENOUS

## 2016-10-09 MED ORDER — ENSURE ENLIVE PO LIQD
237.0000 mL | Freq: Two times a day (BID) | ORAL | Status: DC
  Administered 2016-10-09 – 2016-10-12 (×7): 237 mL via ORAL

## 2016-10-09 MED ORDER — DABIGATRAN ETEXILATE MESYLATE 75 MG PO CAPS
75.0000 mg | ORAL_CAPSULE | Freq: Two times a day (BID) | ORAL | Status: DC
  Administered 2016-10-09 – 2016-10-12 (×7): 75 mg via ORAL
  Filled 2016-10-09 (×7): qty 1

## 2016-10-09 NOTE — ED Provider Notes (Signed)
[facility]-EMERGENCY DEPT Provider Note   CSN: [id number] Arrival date & time: 10/08/16  1913     History   Chief Complaint Chief Complaint  Patient presents with  . Altered Mental Status    HPI [name] is a 83 y.o. male with a past medical history of AAA, CHF, stroke, CAD, presenting today with generalized weakness and dizziness.  History obtained from wife who states this has been going on for the past several days.  Normally he can walk around the house independently and perform duties. Currently he can not get around by himself. He is often falling over to the side and too weak to stand.  She denies any fevers or sick contacts.  He also complains of chest and abdominal pain. No N./V/D. There are no further complaints.  10 Systems reviewed and are negative for acute change except as noted in the HPI.   HPI  Past Medical History:  Diagnosis Date  . AAA (abdominal aortic aneurysm) (HCC)   . Anemia   . Asthma   . Carotid artery disease (HCC)   . CHF (congestive heart failure) (HCC)   . Coronary artery disease   . Depression   . Gout   . H. pylori infection   . Hard of hearing   . Hiatal hernia   . Hyperplasia, prostate   . Hypertension   . PUD (peptic ulcer disease)   . Renal failure, acute (HCC) 11/08/2012    Patient Active Problem List   Diagnosis Date Noted  . CVA (cerebral vascular accident) (HCC) 07/18/2016  . Stroke-like symptoms 07/18/2016  . Stroke (HCC) 07/18/2016  . Anemia 07/18/2016  . Non-English speaking patient 07/18/2016  . COPD, group B, by GOLD 2017 classification (HCC)   . Hypoxemia   . Atelectasis   . HAP (hospital-acquired pneumonia)   . Acute on chronic respiratory failure (HCC)   . Severe sepsis (HCC) 10/03/2015  . HCAP (healthcare-associated pneumonia) 10/03/2015  . Elevated troponin 10/03/2015  . Nausea, vomiting and diarrhea 10/03/2015  . Abdominal pain 10/03/2015  . Pneumonia 07/27/2015  . CKD (chronic kidney disease), stage III  07/27/2015  . Carotid artery disease (HCC) 07/27/2015  . CAP (community acquired pneumonia) 07/27/2015  . Weakness generalized 06/18/2014  . Hyponatremia 06/18/2014  . Dizziness 06/18/2014  . Dizzy 06/18/2014  . Pain in the chest   . Paroxysmal atrial fibrillation (HCC) 11/16/2013  . Diarrhea 11/16/2013  . Vomiting 11/16/2013  . Urticaria 11/16/2013  . Ischemic cardiomyopathy 11/16/2013  . Headache 11/16/2013  . Chronic anticoagulation 11/16/2013  . Neck pain on left side 11/16/2013  . Acute chest pain 11/16/2013  . Chest pain at rest 11/16/2013  . Coronary artery disease 10/14/2013  . Chronic systolic heart failure (HCC) 10/14/2013  . AAA (abdominal aortic aneurysm) without rupture (HCC) 09/08/2013  . Hyperlipidemia 06/08/2013  . History of CVA (cerebrovascular accident) 05/02/2013  . Dyspnea 04/29/2013  . Acute CHF- presume secondary to AF and diastolic dysfunction. (Nl LVF 2010) 04/29/2013  . Acute renal failure superimposed on stage 3 chronic kidney disease (HCC) 04/29/2013  . PVD (peripheral vascular disease) 3.5cm AAA Aug 2014 04/29/2013  . NSTEMI - ? type 2 - Troponin 0.63 04/28/2013  . Chronic bilateral lower abdominal pain 02/17/2013  . Hypertension     Past Surgical History:  Procedure Laterality Date  . CAROTID STENT INSERTION  2015  . CAROTID STENT INSERTION N/A 05/09/2013   Procedure: CAROTID STENT INSERTION;  Surgeon: Runell Gess, MD;  Location: [facility] CATH  LAB;  Service: Cardiovascular;  Laterality: N/A;  . ESOPHAGOGASTRODUODENOSCOPY  06/13/2008,12/31/12  . LEFT HEART CATHETERIZATION WITH CORONARY ANGIOGRAM N/A 05/04/2013   Procedure: LEFT HEART CATHETERIZATION WITH CORONARY ANGIOGRAM;  Surgeon: Peter M Swaz[place], MD;  Location: [facility] CATH LAB;  Service: Cardiovascular;  Laterality: N/A;       Home Medications    Prior to Admission medications   Medication Sig Start Date End Date Taking? Authorizing Provider  allopurinol (ZYLOPRIM) 300 MG tablet Take 300 mg by  mouth daily. To prevent gout    [provider]  arformoterol (BROVANA) 15 MCG/2ML NEBU Take 2 mLs (15 mcg total) by nebulization 2 (two) times daily. 10/10/15   Rhetta Mura, MD  aspirin EC 81 MG tablet Take 1 tablet (81 mg total) by mouth daily. 09/08/13   Marvel Plan, MD  atorvastatin (LIPITOR) 40 MG tablet Take 1 tablet (40 mg total) by mouth daily at 6 PM. 05/11/13   Robbie Lis M, PA-C  budesonide (PULMICORT) 0.5 MG/2ML nebulizer solution Take 2 mLs (0.5 mg total) by nebulization 2 (two) times daily. 10/10/15   Rhetta Mura, MD  carvedilol (COREG) 6.25 MG tablet Take 1 tablet (6.25 mg total) by mouth 2 (two) times daily with a meal. 07/23/16   Runell Gess, MD  dabigatran (PRADAXA) 75 MG CAPS capsule Take 1 capsule (75 mg total) by mouth every 12 (twelve) hours. 07/21/16 09/12/16  Cleora Fleet, MD  esomeprazole (NEXIUM) 40 MG capsule Take 1 capsule (40 mg total) by mouth 2 (two) times daily before a meal. 07/21/16   Johnson, Clanford L, MD  isosorbide mononitrate (IMDUR) 30 MG 24 hr tablet Take 30 mg by mouth once daily Patient taking differently: Take 30 mg by mouth daily. For control of heart pain 07/31/15   Margarita Grizzle, MD  meclizine (ANTIVERT) 25 MG tablet  09/05/16   [provider]  NITROSTAT 0.4 MG SL tablet PLACE 1 TABLET UNDER THE TONGUE EVERY 5 MINUTES FOR 3 DOSES AS NEEDED FOR CHEST PAIN 10/29/15   Runell Gess, MD  tamsulosin (FLOMAX) 0.4 MG CAPS capsule Take 0.4 mg by mouth daily. To improve bladder function    [provider]    Family History Family History  Problem Relation Age of Onset  . Colon cancer Neg Hx     Social History Social History  Substance Use Topics  . Smoking status: Former Smoker    Quit date: 02/17/2002  . Smokeless tobacco: Never Used     Comment: Quit seven years ago   . Alcohol use No     Allergies   Levofloxacin; Eliquis [apixaban]; Phenazopyridine hcl; Septra  [sulfamethoxazole-trimethoprim]; Latex; and Tape   Review of Systems Review of Systems   Physical Exam Updated Vital Signs BP 122/72   Pulse 70   Temp 98.2 F (36.8 C)   Resp 19   SpO2 100%   Physical Exam  Constitutional: He is oriented to person, place, and time. Vital signs are normal. He appears well-developed and well-nourished.  Non-toxic appearance. He does not appear ill. No distress.  HENT:  Head: Normocephalic and atraumatic.  Nose: Nose normal.  Mouth/Throat: Oropharynx is clear and moist. No oropharyngeal exudate.  Eyes: Pupils are equal, round, and reactive to light. Conjunctivae and EOM are normal. No scleral icterus.  Neck: Normal range of motion. Neck supple. No tracheal deviation, no edema, no erythema and normal range of motion present. No thyroid mass and no thyromegaly present.  Cardiovascular: Normal rate, regular rhythm, S1  normal, S2 normal, normal heart sounds, intact distal pulses and normal pulses.  Exam reveals no gallop and no friction rub.   No murmur heard. Pulmonary/Chest: Effort normal and breath sounds normal. No respiratory distress. He has no wheezes. He has no rhonchi. He has no rales.  Abdominal: Soft. Normal appearance and bowel sounds are normal. He exhibits no distension, no ascites and no mass. There is no hepatosplenomegaly. There is no tenderness. There is no rebound, no guarding and no CVA tenderness.  Musculoskeletal: Normal range of motion. He exhibits no edema or tenderness.  Lymphadenopathy:    He has no cervical adenopathy.  Neurological: He is alert and oriented to person, place, and time. He has normal strength. No cranial nerve deficit or sensory deficit.  Skin: Skin is warm, dry and intact. No petechiae and no rash noted. He is not diaphoretic. No erythema. No pallor.  Nursing note and vitals reviewed.    ED Treatments / Results  Labs (all labs ordered are listed, but only abnormal results are displayed) Labs Reviewed    COMPREHENSIVE METABOLIC PANEL - Abnormal; Notable for the following:       Result Value   Sodium 124 (*)    Chloride 94 (*)    Glucose, Bld 101 (*)    Creatinine, Ser 1.80 (*)    Calcium 8.8 (*)    Albumin 3.4 (*)    ALT 13 (*)    Total Bilirubin 1.4 (*)    GFR calc non Af Amer 32 (*)    GFR calc Af Amer 37 (*)    All other components within normal limits  CBC - Abnormal; Notable for the following:    RBC 3.11 (*)    Hemoglobin 9.9 (*)    HCT 29.1 (*)    All other components within normal limits  I-STAT CHEM 8, ED - Abnormal; Notable for the following:    Sodium 127 (*)    Chloride 93 (*)    BUN 22 (*)    Creatinine, Ser 1.70 (*)    Hemoglobin 10.2 (*)    HCT 30.0 (*)    All other components within normal limits  URINALYSIS, ROUTINE W REFLEX MICROSCOPIC  ETHANOL  LIPASE, BLOOD  RAPID URINE DRUG SCREEN, HOSP PERFORMED  I-STAT CG4 LACTIC ACID, ED  I-STAT CG4 LACTIC ACID, ED  I-STAT TROPONIN, ED  POC OCCULT BLOOD, ED    EKG  EKG Interpretation  Date/Time:  Wednesday October 08 2016 19:25:27 EDT Ventricular Rate:  76 PR Interval:    QRS Duration: 98 QT Interval:  442 QTC Calculation: 497 R Axis:   -48 Text Interpretation:  Atrial fibrillation Left axis deviation Anterior infarct , age undetermined ST & T wave abnormality, consider lateral ischemia Abnormal ECG No significant change since last tracing Confirmed by Erroll Luna [id number]) on 10/09/2016 12:45:09 AM       Radiology Dg Chest 2 View  Result Date: 10/08/2016 CLINICAL DATA:  Cough altered mental status EXAM: CHEST  2 VIEW COMPARISON:  10/27/2015 FINDINGS: Slightly coarse interstitial pattern likely chronic. No acute consolidation or effusion. Resolution of left mid lung opacity since prior radiograph. Stable cardiomediastinal silhouette with atherosclerosis. Negative for pneumothorax. Degenerative changes of the spine. IMPRESSION: No active cardiopulmonary disease. Electronically Signed   By: Jasmine Pang M.D.   On: 10/08/2016 20:12   Ct Head Wo Contrast  Result Date: 10/08/2016 CLINICAL DATA:  Altered mental status EXAM: CT HEAD WITHOUT CONTRAST TECHNIQUE: Contiguous axial images were obtained from  the base of the skull through the vertex without intravenous contrast. COMPARISON:  07/18/2016 CT and MRI FINDINGS: Brain: Moderate cerebral atrophy with chronic appearing small vessel ischemic disease. Remote bilateral infarcts again noted with encephalomalacia involving the posterior right frontal and high left parietal lobes. Vascular: Moderate atherosclerosis of the carotid siphons. No hyperdense vessels. Skull: The scalp soft tissues and bony calvarium are unremarkable. Sinuses/Orbits: Bilateral lens replacements. Intact orbits and globes. No acute paranasal sinus disease. Chronic right mastoid effusion. Other: None IMPRESSION: 1. Cerebral atrophy with chronic infarcts in the posterior right frontal and high left parietal lobes. Moderate degree of small vessel ischemia, chronic in appearance. No acute intracranial abnormality. 2. Chronic right mastoid effusion. Electronically Signed   By: Tollie Eth M.D.   On: 10/08/2016 21:30    Procedures Procedures (including critical care time)  Medications Ordered in ED Medications  sodium chloride 0.9 % bolus 1,000 mL (1,000 mLs Intravenous New Bag/Given 10/09/16 0110)     Initial Impression / Assessment and Plan / ED Course  I have reviewed the triage vital signs and the nursing notes.  Pertinent labs & imaging results that were available during my care of the patient were reviewed by me and considered in my medical decision making (see chart for details).     Patient presents to the ED for weakness and inability to perform ADLs.  Hgb is slightly lower at 9 from 11, but otherwise labs are at their normal baseline. Will  Obtain hemoccult. He was given IVF.  CXR, UA, and CT head were unremarkable. Patient may require admission for further  evaluation. Dr. Katrinka Blazing accepts for further care.  Final Clinical Impressions(s) / ED Diagnoses   Final diagnoses:  None    New Prescriptions New Prescriptions   No medications on file     Tomasita Crumble, MD 10/09/16 [phone]

## 2016-10-09 NOTE — ED Notes (Signed)
Attempted to call report

## 2016-10-09 NOTE — ED Notes (Signed)
Pt. Assisted to use a urinal but unable to urinate at this time ,.

## 2016-10-09 NOTE — Progress Notes (Signed)
Initial Nutrition Assessment  DOCUMENTATION CODES:   Not applicable  INTERVENTION:   -Continue Ensure Enlive po BID, each supplement provides 350 kcal and 20 grams of protein  NUTRITION DIAGNOSIS:   Inadequate oral intake related to poor appetite as evidenced by meal completion < 50%.  GOAL:   Patient will meet greater than or equal to 90% of their needs  MONITOR:   PO intake, Supplement acceptance, Labs, Weight trends, Skin, I & O's  REASON FOR ASSESSMENT:   Malnutrition Screening Tool    ASSESSMENT:   [name] is a 83 y.o. male with medical history significant of A. fib on Xarelto, CVA, CHF, chronic kidney disease stage III, COPD, anemia, CAD, hypertension, hyponatremia; who presents with approximately 2 days of weakness and confusion.  Pt admitted with acute metabolic encephalopathy.   Pt receiving nursing care at time of visit. Unable to complete Nutrition-Focused physical exam or obtain further hx at this time.   Per H&P, pt has had weakness, confusion, and decreased appetite over the past 2 days PTA. Pt has had minimal intake of foods and fluids.  Noted meal completion 25%. Pt is taking ordered Ensure supplements per MAR.   Reviewed wt hx; noted UBW around 145-148#. No wt changes are significant for time frame.   Labs reviewed: Na: 131 (on IV supplementation).  Diet Order:  Diet Heart Room service appropriate? Yes; Fluid consistency: Thin  Skin:  Reviewed, no issues  Last BM:  10/08/16  Height:   Ht Readings from Last 1 Encounters:  10/09/16 5' (1.524 m)    Weight:   Wt Readings from Last 1 Encounters:  10/09/16 140 lb (63.5 kg)    Ideal Body Weight:  48.2 kg  BMI:  Body mass index is 27.34 kg/m.  Estimated Nutritional Needs:   Kcal:  1400-1600  Protein:  65-80 grams  Fluid:  1.4-1.6 L  EDUCATION NEEDS:   No education needs identified at this time  [name] A. Mayford Knife, RD, LDN, CDE Pager: [phone] After hours Pager:  [phone]

## 2016-10-09 NOTE — Progress Notes (Addendum)
Pt having c/o SOB. MD vega aware. Pt placed on 2 l/O2. Head elevated. Lung sounds are clear/diminished, Sats sustaining 99-100%, IV fluids d/c'd per MD verbal order.  Will cont. To monitor.

## 2016-10-09 NOTE — Consult Note (Signed)
NEURO HOSPITALIST CONSULT NOTE   Requestig physician: Dr. Wendee Beavers   Reason for Consult: weakness   History obtained from:  Patient  And family but very difficult due to language barrier.   HPI:                                                                                                                                          [name] is an 83 y.o. male who presents to the hospital secondary to weakness for approximately 2 weeks. Patient is a very poor historian and gives very little information when asked questions. What I can gather is that over the last 2 weeks patient has been progressively getting weaker, having falls, failure to thrive, decreased drinking and eating.  On arrival to the hospital his sodium was 124, bun 18 creatinine was 1.8.  Head CT was negative  Past Medical History:  Diagnosis Date  . AAA (abdominal aortic aneurysm) ([place])   . Anemia   . Asthma   . Carotid artery disease ([place])   . CHF (congestive heart failure) ([place])   . Coronary artery disease   . Depression   . Gout   . H. pylori infection   . Hard of hearing   . Hiatal hernia   . Hyperplasia, prostate   . Hypertension   . PUD (peptic ulcer disease)   . Renal failure, acute ([place]) 11/08/2012    Past Surgical History:  Procedure Laterality Date  . CAROTID STENT INSERTION  2015  . CAROTID STENT INSERTION N/A 05/09/2013   Procedure: CAROTID STENT INSERTION;  Surgeon: Lorretta Harp, MD;  Location: [facility] CATH LAB;  Service: Cardiovascular;  Laterality: N/A;  . ESOPHAGOGASTRODUODENOSCOPY  06/13/2008,12/31/12  . LEFT HEART CATHETERIZATION WITH CORONARY ANGIOGRAM N/A 05/04/2013   Procedure: LEFT HEART CATHETERIZATION WITH CORONARY ANGIOGRAM;  Surgeon: Peter M Martinique, MD;  Location: [facility] CATH LAB;  Service: Cardiovascular;  Laterality: N/A;    Family History  Problem Relation Age of Onset  . Colon cancer Neg Hx      Social History:  reports that he quit smoking about 14 years  ago. He has never used smokeless tobacco. He reports that he does not drink alcohol or use drugs.  Allergies  Allergen Reactions  . Levofloxacin Anaphylaxis and Other (See Comments)    Patient told his daughter that it made him "feel worse than he already did" (overall) Headache also (has refused to take anymore)  . Eliquis [Apixaban] Itching and Swelling  . Phenazopyridine Hcl Other (See Comments)    Unknown reaction  . Septra [Sulfamethoxazole-Trimethoprim] Nausea And Vomiting    GI Upset  . Latex Rash  . Tape Rash    MEDICATIONS:  Prior to Admission:  Prescriptions Prior to Admission  Medication Sig Dispense Refill Last Dose  . allopurinol (ZYLOPRIM) 300 MG tablet Take 300 mg by mouth daily. To prevent gout   Taking  . arformoterol (BROVANA) 15 MCG/2ML NEBU Take 2 mLs (15 mcg total) by nebulization 2 (two) times daily. 120 mL 2 Taking  . aspirin EC 81 MG tablet Take 1 tablet (81 mg total) by mouth daily. 90 tablet 3 Taking  . atorvastatin (LIPITOR) 40 MG tablet Take 1 tablet (40 mg total) by mouth daily at 6 PM. 30 tablet 5 Taking  . budesonide (PULMICORT) 0.5 MG/2ML nebulizer solution Take 2 mLs (0.5 mg total) by nebulization 2 (two) times daily. 2 mL 12 Taking  . carvedilol (COREG) 6.25 MG tablet Take 1 tablet (6.25 mg total) by mouth 2 (two) times daily with a meal. 60 tablet 6 Taking  . dabigatran (PRADAXA) 75 MG CAPS capsule Take 1 capsule (75 mg total) by mouth every 12 (twelve) hours. 60 capsule 0 Taking  . esomeprazole (NEXIUM) 40 MG capsule Take 1 capsule (40 mg total) by mouth 2 (two) times daily before a meal.   Taking  . isosorbide mononitrate (IMDUR) 30 MG 24 hr tablet Take 30 mg by mouth once daily (Patient taking differently: Take 30 mg by mouth daily. For control of heart pain) 15 tablet 9 Taking  . meclizine (ANTIVERT) 25 MG tablet    Taking  . NITROSTAT  0.4 MG SL tablet PLACE 1 TABLET UNDER THE TONGUE EVERY 5 MINUTES FOR 3 DOSES AS NEEDED FOR CHEST PAIN 25 tablet 5 Taking  . tamsulosin (FLOMAX) 0.4 MG CAPS capsule Take 0.4 mg by mouth daily. To improve bladder function   Taking   Scheduled: . allopurinol  300 mg Oral Daily  . arformoterol  15 mcg Nebulization BID  . aspirin EC  81 mg Oral Daily  . atorvastatin  40 mg Oral q1800  . budesonide  0.5 mg Nebulization BID  . carvedilol  6.25 mg Oral BID WC  . dabigatran  75 mg Oral Q12H  . feeding supplement (ENSURE ENLIVE)  237 mL Oral BID BM  . isosorbide mononitrate  30 mg Oral Daily  . mouth rinse  15 mL Mouth Rinse BID  . pantoprazole  40 mg Oral BID  . tamsulosin  0.4 mg Oral Daily     ROS:                                                                                                                                       History obtained from patient's family  General ROS: positive for - weakness Psychological ROS: negative for - behavioral disorder, hallucinations, memory difficulties, mood swings or suicidal ideation Ophthalmic ROS: negative for - blurry vision, double vision, eye pain or loss of vision ENT ROS: negative for - epistaxis, nasal discharge, oral lesions, sore throat, tinnitus or vertigo Allergy and  Immunology ROS: negative for - hives or itchy/watery eyes Hematological and Lymphatic ROS: negative for - bleeding problems, bruising or swollen lymph nodes Endocrine ROS: negative for - galactorrhea, hair pattern changes, polydipsia/polyuria or temperature intolerance Respiratory ROS: negative for - cough, hemoptysis, shortness of breath or wheezing Cardiovascular ROS: negative for - chest pain, dyspnea on exertion, edema or irregular heartbeat Gastrointestinal ROS: negative for - abdominal pain, diarrhea, hematemesis, nausea/vomiting or stool incontinence Genito-Urinary ROS: negative for - dysuria, hematuria, incontinence or urinary frequency/urgency Musculoskeletal  ROS: negative for - joint swelling or muscular weakness Neurological ROS: as noted in HPI Dermatological ROS: negative for rash and skin lesion changes   Blood pressure 130/68, pulse 66, temperature 97.6 F (36.4 C), temperature source Oral, resp. rate 18, height 5' (1.524 m), weight 63.5 kg (140 lb), SpO2 99 %.   Neurologic Examination:                                                                                                      HEENT-  Normocephalic, no lesions, without obvious abnormality.  Normal external eye and conjunctiva.  Normal TM's bilaterally.  Normal auditory canals and external ears. Normal external nose, mucus membranes and septum.  Normal pharynx. Cardiovascular- S1, S2 normal, pulses palpable throughout   Lungs- chest clear, no wheezing, rales, normal symmetric air entry Abdomen- normal findings: bowel sounds normal Extremities- no edema Lymph-no adenopathy palpable Musculoskeletal-no joint tenderness, deformity or swelling Skin-warm and dry, no hyperpigmentation, vitiligo, or suspicious lesions  Neurological Examination Mental Status: Alert, does not speak English and is hard to obtain if he knows he is at the hospital. He does not appear a phasic as he is talking to his family with noted problems he has difficulty following commands but this is due to the language barrier.  Cranial Nerves: II:  Visual fields grossly normal,  III,IV, VI: ptosis not present, extra-ocular motions intact bilaterally pupils equal, round, reactive to light and accommodation V,VII: smile symmetric, facial light touch sensation normal bilaterally VIII: hearing normal bilaterally IX,X: uvula rises symmetrically XI: bilateral shoulder shrug XII: midline tongue extension Motor: Right : Upper extremity   5/5    Left:     Upper extremity   5/5  Lower extremity   5/5     Lower extremity   5/5 Tone and bulk:normal tone throughout; no atrophy noted Sensory: Pinprick and light touch intact  throughout, bilaterally Deep Tendon Reflexes: 2+ and symmetric throughout upper extremities with no knee jerk or ankle jerk Plantars: Right: downgoing   Left: downgoing Cerebellar: normal finger-to-nose,  and normal heel-to-shin test Gait: not tested      Lab Results: Basic Metabolic Panel:  Recent Labs Lab 10/08/16 1934 10/08/16 2001 10/09/16 0400 10/09/16 0637  NA 124* 127* 128* 131*  K 4.6 4.6 3.9 3.8  CL 94* 93* 99* 103  CO2 22  --  21* 20*  GLUCOSE 101* 98 89 96  BUN 18 22* 17 16  CREATININE 1.80* 1.70* 1.56* 1.43*  CALCIUM 8.8*  --  8.6* 8.6*    Liver Function Tests:  Recent Labs Lab 10/08/16 1934  AST 21  ALT 13*  ALKPHOS 74  BILITOT 1.4*  PROT 7.8  ALBUMIN 3.4*    Recent Labs Lab 10/09/16 0100  LIPASE 31   No results for input(s): AMMONIA in the last 168 hours.  CBC:  Recent Labs Lab 10/08/16 1934 10/08/16 2001  WBC 6.8  --   HGB 9.9* 10.2*  HCT 29.1* 30.0*  MCV 93.6  --   PLT 173  --     Cardiac Enzymes:  Recent Labs Lab 10/09/16 0400 10/09/16 0637  TROPONINI <0.03 <0.03    Lipid Panel: No results for input(s): CHOL, TRIG, HDL, CHOLHDL, VLDL, LDLCALC in the last 168 hours.  CBG: No results for input(s): GLUCAP in the last 168 hours.  Microbiology: Results for orders placed or performed during the hospital encounter of 10/03/15  Urine culture     Status: None   Collection Time: 10/03/15  8:45 PM  Result Value Ref Range Status   Specimen Description URINE, RANDOM  Final   Special Requests NONE  Final   Culture NO GROWTH  Final   Report Status 10/05/2015 FINAL  Final  Blood Culture (routine x 2)     Status: None   Collection Time: 10/03/15  8:57 PM  Result Value Ref Range Status   Specimen Description BLOOD LEFT HAND  Final   Special Requests IN PEDIATRIC BOTTLE 3CC  Final   Culture NO GROWTH 7 DAYS  Final   Report Status 10/10/2015 FINAL  Final  Blood Culture (routine x 2)     Status: None   Collection Time:  10/03/15  9:05 PM  Result Value Ref Range Status   Specimen Description BLOOD RIGHT HAND  Final   Special Requests BOTTLES DRAWN AEROBIC ONLY 5CC  Final   Culture NO GROWTH 7 DAYS  Final   Report Status 10/10/2015 FINAL  Final  Respiratory Panel by PCR     Status: None   Collection Time: 10/03/15 11:40 PM  Result Value Ref Range Status   Adenovirus NOT DETECTED NOT DETECTED Final   Coronavirus 229E NOT DETECTED NOT DETECTED Final   Coronavirus HKU1 NOT DETECTED NOT DETECTED Final   Coronavirus NL63 NOT DETECTED NOT DETECTED Final   Coronavirus OC43 NOT DETECTED NOT DETECTED Final   Metapneumovirus NOT DETECTED NOT DETECTED Final   Rhinovirus / Enterovirus NOT DETECTED NOT DETECTED Final   Influenza A NOT DETECTED NOT DETECTED Final   Influenza B NOT DETECTED NOT DETECTED Final   Parainfluenza Virus 1 NOT DETECTED NOT DETECTED Final   Parainfluenza Virus 2 NOT DETECTED NOT DETECTED Final   Parainfluenza Virus 3 NOT DETECTED NOT DETECTED Final   Parainfluenza Virus 4 NOT DETECTED NOT DETECTED Final   Respiratory Syncytial Virus NOT DETECTED NOT DETECTED Final   Bordetella pertussis NOT DETECTED NOT DETECTED Final   Chlamydophila pneumoniae NOT DETECTED NOT DETECTED Final   Mycoplasma pneumoniae NOT DETECTED NOT DETECTED Final  C difficile quick screen w PCR reflex     Status: None   Collection Time: 10/04/15  1:53 AM  Result Value Ref Range Status   C Diff antigen NEGATIVE NEGATIVE Final   C Diff toxin NEGATIVE NEGATIVE Final   C Diff interpretation No C. difficile detected.  Final  Gastrointestinal Panel by PCR , Stool     Status: Abnormal   Collection Time: 10/04/15  1:53 AM  Result Value Ref Range Status   Campylobacter species NOT DETECTED NOT DETECTED Final   Plesimonas shigelloides  NOT DETECTED NOT DETECTED Final   Salmonella species NOT DETECTED NOT DETECTED Final   Yersinia enterocolitica NOT DETECTED NOT DETECTED Final   Vibrio species NOT DETECTED NOT DETECTED Final    Vibrio cholerae NOT DETECTED NOT DETECTED Final   Enteroaggregative E coli (EAEC) NOT DETECTED NOT DETECTED Final   Enteropathogenic E coli (EPEC) DETECTED (A) NOT DETECTED Final    Comment: CRITICAL RESULT CALLED TO, READ BACK BY AND VERIFIED WITH: ANNIE MAGBATANG AT 1554 10/04/15 SDR    Enterotoxigenic E coli (ETEC) NOT DETECTED NOT DETECTED Final   Shiga like toxin producing E coli (STEC) NOT DETECTED NOT DETECTED Final   E. coli O157 NOT DETECTED NOT DETECTED Final   Shigella/Enteroinvasive E coli (EIEC) NOT DETECTED NOT DETECTED Final   Cryptosporidium NOT DETECTED NOT DETECTED Final   Cyclospora cayetanensis NOT DETECTED NOT DETECTED Final   Entamoeba histolytica NOT DETECTED NOT DETECTED Final   Giardia lamblia NOT DETECTED NOT DETECTED Final   Adenovirus F40/41 NOT DETECTED NOT DETECTED Final   Astrovirus NOT DETECTED NOT DETECTED Final   Norovirus GI/GII NOT DETECTED NOT DETECTED Final   Rotavirus A NOT DETECTED NOT DETECTED Final   Sapovirus (I, II, IV, and V) NOT DETECTED NOT DETECTED Final  MRSA PCR Screening     Status: None   Collection Time: 10/05/15  7:32 PM  Result Value Ref Range Status   MRSA by PCR NEGATIVE NEGATIVE Final    Comment:        The GeneXpert MRSA Assay (FDA approved for NASAL specimens only), is one component of a comprehensive MRSA colonization surveillance program. It is not intended to diagnose MRSA infection nor to guide or monitor treatment for MRSA infections.     Coagulation Studies: No results for input(s): LABPROT, INR in the last 72 hours.  Imaging: Dg Chest 2 View  Result Date: 10/08/2016 CLINICAL DATA:  Cough altered mental status EXAM: CHEST  2 VIEW COMPARISON:  10/27/2015 FINDINGS: Slightly coarse interstitial pattern likely chronic. No acute consolidation or effusion. Resolution of left mid lung opacity since prior radiograph. Stable cardiomediastinal silhouette with atherosclerosis. Negative for pneumothorax. Degenerative  changes of the spine. IMPRESSION: No active cardiopulmonary disease. Electronically Signed   By: Donavan Foil M.D.   On: 10/08/2016 20:12   Ct Head Wo Contrast  Result Date: 10/08/2016 CLINICAL DATA:  Altered mental status EXAM: CT HEAD WITHOUT CONTRAST TECHNIQUE: Contiguous axial images were obtained from the base of the skull through the vertex without intravenous contrast. COMPARISON:  07/18/2016 CT and MRI FINDINGS: Brain: Moderate cerebral atrophy with chronic appearing small vessel ischemic disease. Remote bilateral infarcts again noted with encephalomalacia involving the posterior right frontal and high left parietal lobes. Vascular: Moderate atherosclerosis of the carotid siphons. No hyperdense vessels. Skull: The scalp soft tissues and bony calvarium are unremarkable. Sinuses/Orbits: Bilateral lens replacements. Intact orbits and globes. No acute paranasal sinus disease. Chronic right mastoid effusion. Other: None IMPRESSION: 1. Cerebral atrophy with chronic infarcts in the posterior right frontal and high left parietal lobes. Moderate degree of small vessel ischemia, chronic in appearance. No acute intracranial abnormality. 2. Chronic right mastoid effusion. Electronically Signed   By: Ashley Royalty M.D.   On: 10/08/2016 21:30       Assessment and plan per attending neurologist  Etta Quill PA-C [facility] [phone]  10/09/2016, 9:41 AM I seen the patient and reviewed the above note. He tells me that he has been having a right sided frontal headache for the past  month. He has not been eating or drinking. The episodes of weakness have been associated with lightheadedness per the patient.  He denies jaw claudication or vision change.  Dizziness is a long-standing issue, but the issue that he is having currently seems different, and I think it is more lightheadedness though difficult to be sure because of the language barrier.  Assessment/Plan: 83 year old male with a  history of one month of headaches, failure to thrive. With his history, I am concerned for possible temporal arteritis. If he does not have evidence of inflammation, then I think another consideration would be general failure to thrive with orthostatic headaches. I would favor checking ESR. Also agree with vestibular PT.  1) ESR 2) PT 3) will try reglan for headache  Roland Rack, MD [facility] [phone]  If 7pm- 7am, please page neurology on call as listed in [place].

## 2016-10-09 NOTE — Evaluation (Signed)
Physical Therapy Evaluation Patient Details Name: [name] MRN: [id number] DOB: [birth year] Today's Date: 10/09/2016   History of Present Illness  83 year old male with 2 week hx of progressively getting weaker, having falls, dizziness, failure to thrive, decreased drinking and eating.  PMH: CVA, anemia, CAD, CHF, AAA, renal failure  Clinical Impression  Pt admitted with above diagnosis. Pt currently with functional limitations due to the deficits listed below (see PT Problem List).  Pt will benefit from skilled PT to increase their independence and safety with mobility to allow discharge to the venue listed below.  Pt with c/o dizziness throughout session.  Daughter, who translated, states he is dizzy all the time and feels this is due to ancestors. She stated the dizziness does change to things spinning to also a heavy feeling. No major nystagmus noted, but will have a vestibular trained PT treat pt next and screen for any vestibular issues.  Pt's 2 daughters and wife were present.  One daughter speaks [place] and translated.  Recommend HHPT, although daughter was apprehensive about this.  Also recommend RW as family states he has a SW.     Follow Up Recommendations Home health PT;Supervision/Assistance - 24 hour    Equipment Recommendations  Rolling walker with 5" wheels    Recommendations for Other Services       Precautions / Restrictions Precautions Precautions: Fall Restrictions Weight Bearing Restrictions: No      Mobility  Bed Mobility Overal bed mobility: Modified Independent             General bed mobility comments: Pt with c/o dizziness upon sitting.  Daughter reports he is always dizzy.  She feels it is due to his ancestors.  No major eye nystagmus noted.  Transfers Overall transfer level: Needs assistance Equipment used: None Transfers: Sit to/from Stand Sit to Stand: Min assist         General transfer comment: MIN A for steadying and for safety.  Pt too unsteady without AD to attempt ambulation, so brought RW to him.  Pt continued to reports dizziness to daughter.  Ambulation/Gait Ambulation/Gait assistance: Min assist Ambulation Distance (Feet): 15 Feet Assistive device: Rolling walker (2 wheeled) Gait Pattern/deviations: Decreased step length - right;Decreased step length - left;Trunk flexed     General Gait Details: Pt with flexed trunk and heavy reliance on RW with reports of feeling dizzy and weak.  Orthostatics had been taken shortly before PT enty and were negative.  Stairs            Wheelchair Mobility    Modified Rankin (Stroke Patients Only)       Balance Overall balance assessment: History of Falls;Needs assistance   Sitting balance-Leahy Scale: Fair     Standing balance support: No upper extremity supported Standing balance-Leahy Scale: Poor Standing balance comment: requires RW for steadiness                             Pertinent Vitals/Pain Pain Assessment: No/denies pain    Home Living Family/patient expects to be discharged to:: Private residence Living Arrangements: Spouse/significant other;Children Available Help at Discharge: Family;Available 24 hours/day Type of Home: House Home Access: Stairs to enter Entrance Stairs-Rails: None Entrance Stairs-Number of Steps: 3 Home Layout: One level Home Equipment: Cane - single point;Walker - standard      Prior Function Level of Independence: Independent         Comments: Daughter reports he normally ambulates  I'ly even though they want him to use a walker.  "He just won't do it".     Hand Dominance   Dominant Hand: Right    Extremity/Trunk Assessment   Upper Extremity Assessment Upper Extremity Assessment: Generalized weakness    Lower Extremity Assessment Lower Extremity Assessment: Generalized weakness       Communication   Communication: Prefers language other than English;HOH (One daughter speaks [place]  and A with communication)  Cognition Arousal/Alertness: Awake/alert Behavior During Therapy: WFL for tasks assessed/performed Overall Cognitive Status: Difficult to assess                                        General Comments      Exercises     Assessment/Plan    PT Assessment Patient needs continued PT services  PT Problem List Decreased strength;Decreased activity tolerance;Decreased balance;Decreased mobility;Decreased knowledge of use of DME       PT Treatment Interventions DME instruction;Gait training;Functional mobility training;Stair training;Balance training;Therapeutic activities;Therapeutic exercise;Patient/family education    PT Goals (Current goals can be found in the Care Plan section)  Acute Rehab PT Goals Patient Stated Goal: Daughter wants to figure out what is going on PT Goal Formulation: With family Time For Goal Achievement: 10/23/16 Potential to Achieve Goals: Good    Frequency Min 3X/week   Barriers to discharge        Co-evaluation               AM-PAC PT "6 Clicks" Daily Activity  Outcome Measure Difficulty turning over in bed (including adjusting bedclothes, sheets and blankets)?: None Difficulty moving from lying on back to sitting on the side of the bed? : None Difficulty sitting down on and standing up from a chair with arms (e.g., wheelchair, bedside commode, etc,.)?: Unable Help needed moving to and from a bed to chair (including a wheelchair)?: A Little Help needed walking in hospital room?: A Little Help needed climbing 3-5 steps with a railing? : A Lot 6 Click Score: 17    End of Session Equipment Utilized During Treatment: Gait belt Activity Tolerance: Patient limited by fatigue Patient left: in chair;with call bell/phone within reach;with chair alarm set;with family/visitor present Nurse Communication: Mobility status PT Visit Diagnosis: Unsteadiness on feet (R26.81);Difficulty in walking, not elsewhere  classified (R26.2);Muscle weakness (generalized) (M62.81)    Time: 1027-1050 PT Time Calculation (min) (ACUTE ONLY): 23 min   Charges:   PT Evaluation $PT Eval Moderate Complexity: 1 Mod PT Treatments $Therapeutic Activity: 8-22 mins   PT G Codes:        [name], [name] Pager [phone] 10/09/2016   Enzo Montgomery 10/09/2016, 12:42 PM

## 2016-10-09 NOTE — Progress Notes (Signed)
Pt seen and evaluated earlier this am by my associate. Please refer to H and P for details. I have consulted Neurology for further evaluation and recommendations given complaints of dizziness in context with patient with chronic strokes and afib.  [name]  Gen: pt in nad, alert and awake CV: no cyanosis Pulm: equal chest rise.  Revaluate next am.

## 2016-10-09 NOTE — H&P (Signed)
History and Physical    [name]:[id number] DOB: Oct 09, 1926 DOA: 10/09/2016  Referring MD/NP/PA: Dr. Mora Bellman PCP: [name], MD  Patient coming from: Home  Chief Complaint: Weakness and confusion  HPI: [name] is a 83 y.o. male with medical history significant of A. fib on Xarelto, CVA, CHF, chronic kidney disease stage III, COPD, anemia, CAD, hypertension, hyponatremia; who presents with approximately 2 days of weakness and confusion. Patient's wife helps interpret. Normally the patient is able to ambulate and complete ADLs without assistance. However over the last 2 days patient has patient has been more lethargic and weak and not getting up out of bed. Whenever sitting up patient complained of being dizzy as though the room were spinning as well as lightheaded as though he may pass out. Wife notes that he's not had much appetite are taken in much fluids. She notes that he was confused and couldn't remember where the bathroom was. Associated symptoms include complaints of frontal headache and intermittent chest tightness/pain. She notes that he chronically has a cough that has been unchanged.   ED Course: Upon admission into emergency department patient was seen to be afebrile with respirations 16-32, and all the vital signs relatively within normal limits. Labs revealed hemoglobin 9.9, sodium of 124, BUN 18, creatinine 1.8    Review of Systems: Review of Systems  Constitutional: Positive for malaise/fatigue.  Skin: Negative for rash.  Neurological: Positive for weakness.       Positive for change in mental status    Past Medical History:  Diagnosis Date  . AAA (abdominal aortic aneurysm) (HCC)   . Anemia   . Asthma   . Carotid artery disease (HCC)   . CHF (congestive heart failure) (HCC)   . Coronary artery disease   . Depression   . Gout   . H. pylori infection   . Hard of hearing   . Hiatal hernia   . Hyperplasia, prostate   . Hypertension   . PUD  (peptic ulcer disease)   . Renal failure, acute (HCC) 11/08/2012    Past Surgical History:  Procedure Laterality Date  . CAROTID STENT INSERTION  2015  . CAROTID STENT INSERTION N/A 05/09/2013   Procedure: CAROTID STENT INSERTION;  Surgeon: Runell Gess, MD;  Location: [facility] CATH LAB;  Service: Cardiovascular;  Laterality: N/A;  . ESOPHAGOGASTRODUODENOSCOPY  06/13/2008,12/31/12  . LEFT HEART CATHETERIZATION WITH CORONARY ANGIOGRAM N/A 05/04/2013   Procedure: LEFT HEART CATHETERIZATION WITH CORONARY ANGIOGRAM;  Surgeon: Peter M Swaziland, MD;  Location: [facility] CATH LAB;  Service: Cardiovascular;  Laterality: N/A;     reports that he quit smoking about 14 years ago. He has never used smokeless tobacco. He reports that he does not drink alcohol or use drugs.  Allergies  Allergen Reactions  . Levofloxacin Anaphylaxis and Other (See Comments)    Patient told his daughter that it made him "feel worse than he already did" (overall) Headache also (has refused to take anymore)  . Eliquis [Apixaban] Itching and Swelling  . Phenazopyridine Hcl Other (See Comments)    Unknown reaction  . Septra [Sulfamethoxazole-Trimethoprim] Nausea And Vomiting    GI Upset  . Latex Rash  . Tape Rash    Family History  Problem Relation Age of Onset  . Colon cancer Neg Hx     Prior to Admission medications   Medication Sig Start Date End Date Taking? Authorizing Provider  allopurinol (ZYLOPRIM) 300 MG tablet Take 300 mg by mouth daily. To prevent gout  [provider]  arformoterol (BROVANA) 15 MCG/2ML NEBU Take 2 mLs (15 mcg total) by nebulization 2 (two) times daily. 10/10/15   Rhetta Mura, MD  aspirin EC 81 MG tablet Take 1 tablet (81 mg total) by mouth daily. 09/08/13   Marvel Plan, MD  atorvastatin (LIPITOR) 40 MG tablet Take 1 tablet (40 mg total) by mouth daily at 6 PM. 05/11/13   Robbie Lis M, PA-C  budesonide (PULMICORT) 0.5 MG/2ML nebulizer solution Take 2 mLs (0.5 mg total) by  nebulization 2 (two) times daily. 10/10/15   Rhetta Mura, MD  carvedilol (COREG) 6.25 MG tablet Take 1 tablet (6.25 mg total) by mouth 2 (two) times daily with a meal. 07/23/16   Runell Gess, MD  dabigatran (PRADAXA) 75 MG CAPS capsule Take 1 capsule (75 mg total) by mouth every 12 (twelve) hours. 07/21/16 09/12/16  Cleora Fleet, MD  esomeprazole (NEXIUM) 40 MG capsule Take 1 capsule (40 mg total) by mouth 2 (two) times daily before a meal. 07/21/16   Johnson, Clanford L, MD  isosorbide mononitrate (IMDUR) 30 MG 24 hr tablet Take 30 mg by mouth once daily Patient taking differently: Take 30 mg by mouth daily. For control of heart pain 07/31/15   Margarita Grizzle, MD  meclizine (ANTIVERT) 25 MG tablet  09/05/16   [provider]  NITROSTAT 0.4 MG SL tablet PLACE 1 TABLET UNDER THE TONGUE EVERY 5 MINUTES FOR 3 DOSES AS NEEDED FOR CHEST PAIN 10/29/15   Runell Gess, MD  tamsulosin (FLOMAX) 0.4 MG CAPS capsule Take 0.4 mg by mouth daily. To improve bladder function    [provider]    Physical Exam:  Constitutional: NAD, calm, comfortable Vitals:   10/08/16 1931 10/09/16 0020 10/09/16 0046  BP: (!) 160/87 (!) 182/83   Pulse: 74 71   Resp: 18 20   Temp: 97.7 F (36.5 C) 97.6 F (36.4 C) 98.2 F (36.8 C)  TempSrc: Oral    SpO2: 98% 100%    Eyes: PERRL, lids and conjunctivae normal ENMT: Mucous membranes are moist. Posterior pharynx clear of any exudate or lesions.Normal dentition.  Neck: normal, supple, no masses, no thyromegaly Respiratory: clear to auscultation bilaterally, no wheezing, no crackles. Normal respiratory effort. No accessory muscle use.  Cardiovascular: Regular rate and rhythm, no murmurs / rubs / gallops. No extremity edema. 2+ pedal pulses. No carotid bruits.  Abdomen: no tenderness, no masses palpated. No hepatosplenomegaly. Bowel sounds positive.  Musculoskeletal: no clubbing / cyanosis. No joint deformity upper and lower extremities.  Good ROM, no contractures. Normal muscle tone.  Skin: no rashes, lesions, ulcers. No induration Neurologic: CN 2-12 grossly intact. Sensation intact, DTR normal. Strength 5/5 in all 4.  Psychiatric: Normal judgment and insight. Alert and oriented x 3. Normal mood.     Labs on Admission: I have personally reviewed following labs and imaging studies  CBC:  Recent Labs Lab 10/08/16 1934 10/08/16 2001  WBC 6.8  --   HGB 9.9* 10.2*  HCT 29.1* 30.0*  MCV 93.6  --   PLT 173  --    Basic Metabolic Panel:  Recent Labs Lab 10/08/16 1934 10/08/16 2001  NA 124* 127*  K 4.6 4.6  CL 94* 93*  CO2 22  --   GLUCOSE 101* 98  BUN 18 22*  CREATININE 1.80* 1.70*  CALCIUM 8.8*  --    GFR: CrCl cannot be calculated (Unknown ideal weight.). Liver Function Tests:  Recent Labs Lab 10/08/16 1934  AST  21  ALT 13*  ALKPHOS 74  BILITOT 1.4*  PROT 7.8  ALBUMIN 3.4*    Recent Labs Lab 10/09/16 0100  LIPASE 31   No results for input(s): AMMONIA in the last 168 hours. Coagulation Profile: No results for input(s): INR, PROTIME in the last 168 hours. Cardiac Enzymes: No results for input(s): CKTOTAL, CKMB, CKMBINDEX, TROPONINI in the last 168 hours. BNP (last 3 results) No results for input(s): PROBNP in the last 8760 hours. HbA1C: No results for input(s): HGBA1C in the last 72 hours. CBG: No results for input(s): GLUCAP in the last 168 hours. Lipid Profile: No results for input(s): CHOL, HDL, LDLCALC, TRIG, CHOLHDL, LDLDIRECT in the last 72 hours. Thyroid Function Tests: No results for input(s): TSH, T4TOTAL, FREET4, T3FREE, THYROIDAB in the last 72 hours. Anemia Panel: No results for input(s): VITAMINB12, FOLATE, FERRITIN, TIBC, IRON, RETICCTPCT in the last 72 hours. Urine analysis:    Component Value Date/Time   COLORURINE YELLOW 10/08/2016 1937   APPEARANCEUR CLEAR 10/08/2016 1937   LABSPEC 1.012 10/08/2016 1937   PHURINE 6.0 10/08/2016 1937   GLUCOSEU NEGATIVE  10/08/2016 1937   HGBUR NEGATIVE 10/08/2016 1937   BILIRUBINUR NEGATIVE 10/08/2016 1937   KETONESUR NEGATIVE 10/08/2016 1937   PROTEINUR NEGATIVE 10/08/2016 1937   UROBILINOGEN 0.2 07/07/2014 0604   NITRITE NEGATIVE 10/08/2016 1937   LEUKOCYTESUR NEGATIVE 10/08/2016 1937   Sepsis Labs: No results found for this or any previous visit (from the past 240 hour(s)).   Radiological Exams on Admission: Dg Chest 2 View  Result Date: 10/08/2016 CLINICAL DATA:  Cough altered mental status EXAM: CHEST  2 VIEW COMPARISON:  10/27/2015 FINDINGS: Slightly coarse interstitial pattern likely chronic. No acute consolidation or effusion. Resolution of left mid lung opacity since prior radiograph. Stable cardiomediastinal silhouette with atherosclerosis. Negative for pneumothorax. Degenerative changes of the spine. IMPRESSION: No active cardiopulmonary disease. Electronically Signed   By: Jasmine Pang M.D.   On: 10/08/2016 20:12   Ct Head Wo Contrast  Result Date: 10/08/2016 CLINICAL DATA:  Altered mental status EXAM: CT HEAD WITHOUT CONTRAST TECHNIQUE: Contiguous axial images were obtained from the base of the skull through the vertex without intravenous contrast. COMPARISON:  07/18/2016 CT and MRI FINDINGS: Brain: Moderate cerebral atrophy with chronic appearing small vessel ischemic disease. Remote bilateral infarcts again noted with encephalomalacia involving the posterior right frontal and high left parietal lobes. Vascular: Moderate atherosclerosis of the carotid siphons. No hyperdense vessels. Skull: The scalp soft tissues and bony calvarium are unremarkable. Sinuses/Orbits: Bilateral lens replacements. Intact orbits and globes. No acute paranasal sinus disease. Chronic right mastoid effusion. Other: None IMPRESSION: 1. Cerebral atrophy with chronic infarcts in the posterior right frontal and high left parietal lobes. Moderate degree of small vessel ischemia, chronic in appearance. No acute intracranial  abnormality. 2. Chronic right mastoid effusion. Electronically Signed   By: Tollie Eth M.D.   On: 10/08/2016 21:30    EKG: Independently reviewed. Atrial fibrillation similar to previous tracings   Assessment/Plan Acute metabolic encephalopathy, hyponatremia: Acute on chronic. Patient presents with reports of confusion and generalized weakness. Initial CT scan negative for any acute abnormality. Patient is normally hyponatremic but normally his sodium levels higher than 127. Suspect patient's sodium levels could possibly be contributing to his symptoms. - Admit to a telemetry bed  - neuro checks q 4 hours - NS IV Fluids 75 ml/hour, adjust fluids as needed - Check BMPq 4 hrs x 2  Acute kidney injury on chronic kidney disease stage III:  Patient presents with a creatinine of 1.8 previously creatinines noted to be 1.3 prior to discharged on last admission in 07/2016. - IV fluids normal saline - Recheck BMP in a.m.  Dizziness:  - Check orthostatic vital signs in a.m.  Generalized weakness  - PT to eval and treat   COPD, with mild exacerbation - Continue Pulmicort and Brovana nebs - duonebs prn SOB/wheezing  Chest pain: Acute. Patient presents with complaints of intermittent chest tightness. - Trend cardiac enzymes  Systolic CHF: last EF noted to be 45-50% on echocardiogram performed on 07/18/2016. - Strict I and O's and daily weights  Paroxysmal atrial fibrillation - Continue Praxada  Essential hypertension - Continue metoprolol and isosorbide mononitrate   Hyperlipidemia  - Continued atorvastatin  BPH  - continue Flomax   DVT prophylaxis:Praxada Code Status:Full Family Communication: Discussed plan of care with patient family present side Disposition Plan: TBD  Consults called: None  Admission status:Inpatient  Clydie Braun MD [facility] Pager [phone]   If 7PM-7AM, please contact night-coverage [email or web address] Password [email or web address]  10/09/2016, 2:04 AM

## 2016-10-09 NOTE — Care Management Note (Signed)
Case Management Note  Patient Details  Name: Keeon Irlbeck MRN: [id number] Date of Birth: 06-04-1926  Subjective/Objective:             Admitted with acute metabolic encephalopathy, PMH: CVA, anemia, CAD, CHF, AAA, renal failure.  Resides with family.   Gelene Mink (Daughter)     [phone]            PCP: Benedetto Goad  Action/Plan: Return to home when medically stable. CM to f/u with disposition needs.  Expected Discharge Date:                  Expected Discharge Plan:  Home w Home Health Services  In-House Referral:     Discharge planning Services  CM Consult  Post Acute Care Choice:    Choice offered to:  Adult Children, Patient  DME Arranged:    DME Agency:     HH Arranged:   PT HH Agency:  [facility], pending MD's order.  Status of Service:  In process, will continue to follow  If discussed at Long Length of Stay Meetings, dates discussed:    Additional Comments:  Epifanio Lesches, RN 10/09/2016, 4:20 PM

## 2016-10-10 ENCOUNTER — Observation Stay (HOSPITAL_COMMUNITY): Payer: Medicare Other

## 2016-10-10 DIAGNOSIS — I6330 Cerebral infarction due to thrombosis of unspecified cerebral artery: Secondary | ICD-10-CM

## 2016-10-10 DIAGNOSIS — I480 Paroxysmal atrial fibrillation: Secondary | ICD-10-CM | POA: Diagnosis not present

## 2016-10-10 DIAGNOSIS — N179 Acute kidney failure, unspecified: Secondary | ICD-10-CM | POA: Diagnosis not present

## 2016-10-10 DIAGNOSIS — G9341 Metabolic encephalopathy: Secondary | ICD-10-CM | POA: Diagnosis not present

## 2016-10-10 DIAGNOSIS — N189 Chronic kidney disease, unspecified: Secondary | ICD-10-CM | POA: Diagnosis not present

## 2016-10-10 DIAGNOSIS — E7800 Pure hypercholesterolemia, unspecified: Secondary | ICD-10-CM | POA: Diagnosis not present

## 2016-10-10 DIAGNOSIS — I6340 Cerebral infarction due to embolism of unspecified cerebral artery: Secondary | ICD-10-CM | POA: Diagnosis not present

## 2016-10-10 DIAGNOSIS — I63511 Cerebral infarction due to unspecified occlusion or stenosis of right middle cerebral artery: Secondary | ICD-10-CM | POA: Insufficient documentation

## 2016-10-10 MED ORDER — SODIUM CHLORIDE 0.9 % IV SOLN
INTRAVENOUS | Status: DC
  Administered 2016-10-10: 14:00:00 via INTRAVENOUS
  Administered 2016-10-11: 500 mL via INTRAVENOUS
  Administered 2016-10-11: 04:00:00 via INTRAVENOUS

## 2016-10-10 MED ORDER — CARVEDILOL 3.125 MG PO TABS
3.1250 mg | ORAL_TABLET | Freq: Two times a day (BID) | ORAL | Status: DC
  Administered 2016-10-10 – 2016-10-12 (×4): 3.125 mg via ORAL
  Filled 2016-10-10 (×4): qty 1

## 2016-10-10 NOTE — Progress Notes (Signed)
Physical Therapy Treatment Patient Details Name: [name] MRN: [id number] DOB: 1926/02/29 Today's Date: 10/10/2016    History of Present Illness 82 year old male with 2 week hx of progressively getting weaker, having falls, dizziness, failure to thrive, decreased drinking and eating.  PMH: CVA, anemia, CAD, CHF, AAA, renal failure    PT Comments    Patient progressing with decreased dizziness and slight improvements in balance.  Feel safest probably with walker, but daughter reports he is stubborn and won't use one at home.  Today needed HHA for support without device.  Feel dizziness likely more motion sensitivity than related to current issues as chronic in nature.  Will benefit from follow up HHPT at d/c.   Follow Up Recommendations  Home health PT     Equipment Recommendations  None recommended by PT    Recommendations for Other Services       Precautions / Restrictions Precautions Precautions: Fall    Mobility  Bed Mobility Overal bed mobility: Modified Independent                Transfers   Equipment used: None Transfers: Sit to/from Stand Sit to Stand: Supervision;Min guard         General transfer comment: limited by dizziness and needing assist for balance initially  Ambulation/Gait Ambulation/Gait assistance: Min assist   Assistive device: 1 person hand held assist;None Gait Pattern/deviations: Step-through pattern;Decreased stride length;Trunk flexed     General Gait Details: unsteady and so gave assist with HHA; daughter reports would not use walker or cane at home, on RA SpO2 with ambulation 98%   Stairs            Wheelchair Mobility    Modified Rankin (Stroke Patients Only)       Balance Overall balance assessment: Needs assistance Sitting-balance support: Feet supported Sitting balance-Leahy Scale: Good     Standing balance support: No upper extremity supported Standing balance-Leahy Scale: Poor Standing balance  comment: unsteady standing without UE support               High Level Balance Comments: side stepping, sit<>stand no UE support x5, standing heel raises, hip abduction & marching x 5-10            Cognition Arousal/Alertness: Awake/alert Behavior During Therapy: WFL for tasks assessed/performed Overall Cognitive Status: Within Functional Limits for tasks assessed                                        Exercises      General Comments General comments (skin integrity, edema, etc.): daughter in room and translates for pt; reports history of "woozy" dizziness for years. States it is better today than it was, but still not to a "good" day (some days he cooks per daughter)        Pertinent Vitals/Pain Pain Assessment: No/denies pain    Home Living                      Prior Function            PT Goals (current goals can now be found in the care plan section) Progress towards PT goals: Progressing toward goals    Frequency    Min 3X/week      PT Plan Current plan remains appropriate    Co-evaluation  AM-PAC PT "6 Clicks" Daily Activity  Outcome Measure  Difficulty turning over in bed (including adjusting bedclothes, sheets and blankets)?: None Difficulty moving from lying on back to sitting on the side of the bed? : None Difficulty sitting down on and standing up from a chair with arms (e.g., wheelchair, bedside commode, etc,.)?: Unable Help needed moving to and from a bed to chair (including a wheelchair)?: A Little Help needed walking in hospital room?: A Little Help needed climbing 3-5 steps with a railing? : A Lot 6 Click Score: 17    End of Session Equipment Utilized During Treatment: Gait belt Activity Tolerance: Patient tolerated treatment well Patient left: with call bell/phone within reach;with family/visitor present;in bed   PT Visit Diagnosis: Unsteadiness on feet (R26.81);Difficulty in walking, not  elsewhere classified (R26.2);Muscle weakness (generalized) (M62.81)     Time: 2111-7356 PT Time Calculation (min) (ACUTE ONLY): 28 min  Charges:  $Gait Training: 8-22 mins $Therapeutic Activity: 8-22 mins                    G CodesSheran Robinson, [name] [phone] 10/10/2016    [name] 10/10/2016, 5:01 PM

## 2016-10-10 NOTE — Progress Notes (Signed)
STROKE TEAM PROGRESS NOTE   HISTORY OF PRESENT ILLNESS (per record) [name] is an 83 y.o. male with a hx of AAA, carotid artery disease s/p L ICA stent, CAD, CHF, hypertension, PUD with H. pylori infection, anemia, and acute renal failure in 2014 who presents to the hospital secondary to weakness for approximately 2 weeks. Patient is a very poor historian and gives very little information when asked questions. What I can gather is that over the last 2 weeks patient has been progressively getting weaker, having falls, failure to thrive, decreased drinking and eating.  On arrival to the hospital his sodium was 124, bun 18 creatinine was 1.8.  MRI brain with acute 1 cm RIGHT posterior limb of internal capsule infarct.  Patient was not administered IV t-PA secondary to arriving outside of the tPA treatment window/unclear last known well. He was admitted to General Neurology for further evaluation and treatment.   SUBJECTIVE (INTERVAL HISTORY) His wife and daughter are at the bedside.  Daughter serves as Engineer, technical sales. Pt still has low BP, but no complains. Follows commands well and orientated x 3.    OBJECTIVE Temp:  [97.7 F (36.5 C)-98.1 F (36.7 C)] 98.1 F (36.7 C) (08/31 0516) Pulse Rate:  [59-64] 63 (08/31 0516) Cardiac Rhythm: Atrial fibrillation (08/30 1923) Resp:  [16-26] 16 (08/31 0516) BP: (84-128)/(41-69) 113/44 (08/31 0516) SpO2:  [94 %-100 %] 94 % (08/31 0829)  CBC:  Recent Labs Lab 10/08/16 1934 10/08/16 2001  WBC 6.8  --   HGB 9.9* 10.2*  HCT 29.1* 30.0*  MCV 93.6  --   PLT 173  --     Basic Metabolic Panel:  Recent Labs Lab 10/09/16 0400 10/09/16 0637  NA 128* 131*  K 3.9 3.8  CL 99* 103  CO2 21* 20*  GLUCOSE 89 96  BUN 17 16  CREATININE 1.56* 1.43*  CALCIUM 8.6* 8.6*    Lipid Panel:    Component Value Date/Time   CHOL 100 07/18/2016 0757   CHOL 122 09/09/2013 0815   TRIG 138 07/18/2016 0757   HDL 30 (L) 07/18/2016 0757   HDL 35 (L)  09/09/2013 0815   CHOLHDL 3.3 07/18/2016 0757   VLDL 28 07/18/2016 0757   LDLCALC 42 07/18/2016 0757   LDLCALC 50 09/09/2013 0815   HgbA1c:  Lab Results  Component Value Date   HGBA1C 6.4 (H) 07/18/2016   Urine Drug Screen:    Component Value Date/Time   LABOPIA NONE DETECTED 10/08/2016 1937   COCAINSCRNUR NONE DETECTED 10/08/2016 1937   LABBENZ NONE DETECTED 10/08/2016 1937   AMPHETMU NONE DETECTED 10/08/2016 1937   THCU NONE DETECTED 10/08/2016 1937   LABBARB NONE DETECTED 10/08/2016 1937    Alcohol Level     Component Value Date/Time   ETH <5 10/09/2016 0100    IMAGING I have personally reviewed the radiological images below and agree with the radiology interpretations.  Dg Chest 2 View  10/08/2016 IMPRESSION: No active cardiopulmonary disease.  Ct Head Wo Contrast 10/08/2016 IMPRESSION: 1. Cerebral atrophy with chronic infarcts in the posterior right frontal and high left parietal lobes. Moderate degree of small vessel ischemia, chronic in appearance. No acute intracranial abnormality. 2. Chronic right mastoid effusion.   Mr Brain Wo Contrast 10/09/2016 IMPRESSION: 1. Acute 1 cm RIGHT posterior limb of internal capsule infarct. 2. Old RIGHT frontal (MCA territory), LEFT occipital lobe (PCA territory) and LEFT frontoparietal (MCA territory) infarcts. Old LEFT lacunar infarcts. Moderate to severe chronic small vessel ischemic disease. 3. Old  small LEFT frontal subdural hematoma versus hygroma.   TTE 07/18/2016 Study Conclusions - Left ventricle: The cavity size was normal. There was mild  concentric hypertrophy. Systolic function was mildly reduced. The  estimated ejection fraction was in the range of 45% to 50%. There is akinesis of the basalinferior myocardium. There is hypokinesis of the entireinferolateral myocardium. - Aortic valve: There was mild regurgitation. Valve area (VTI): 1.66 cm^2. Valve area (Vmax): 2.34 cm^2. Valve area (Vmean): 2.05 cm^2. - Mitral valve:  There was moderate regurgitation. - Left atrium: The atrium was moderately dilated. - Right atrium: The atrium was mildly dilated. - Tricuspid valve: There was moderate regurgitation. - Pulmonic valve: There was trivial regurgitation. - Pulmonary arteries: PA peak pressure: 41 mm Hg (S). - Pericardium, extracardiac: A trivial, free-flowing pericardial effusion was identified along the right ventricular free wall. Impressions: - The right ventricular systolic pressure was increased consistent   with moderate pulmonary hypertension.  CTA head and neck 07/18/16 1. Negative CTA for emergent large vessel occlusion. 2. Negative CT perfusion. No evidence for acute infarct or perfusion mismatch. 3. Moderate to severe diffuse narrowing of the mid-distal right M1 segment to approximately 75%. This is presumably chronic in nature given the lack of perfusion mismatch. 4. Patent left carotid artery stent. Moderate narrowing at the distal aspect of the stent to approximately 70% 5. Prominent atheromatous disease involving the visualized aortic arch, with additional mild to moderate multifocal atherosclerotic changes as above.   PHYSICAL EXAM  Temp:  [97.9 F (36.6 C)-98.1 F (36.7 C)] 98.1 F (36.7 C) (08/31 2018) Pulse Rate:  [62-92] 62 (08/31 2018) Resp:  [16-20] 17 (08/31 2018) BP: (94-127)/(44-61) 119/61 (08/31 2018) SpO2:  [94 %-100 %] 100 % (08/31 2018)  General - Well nourished, well developed, in no apparent distress.  Ophthalmologic - Fundi not visualized due to noncooperation.  Cardiovascular - irregularly irregular heart rate and rhythm.  Mental Status -  Level of arousal and orientation to place, and person were intact, but not orientated to place. Language including expression, naming, repetition, comprehension was assessed and found intact.  Cranial Nerves II - XII - II - blinking to visual threat bilaterally. III, IV, VI - Extraocular movements intact. V - Facial  sensation intact bilaterally. VII - Facial movement intact bilaterally. VIII - Hearing & vestibular intact bilaterally. X - Palate elevates symmetrically. XI - Chin turning & shoulder shrug intact bilaterally. XII - Tongue protrusion intact.  Motor Strength - The patient's strength was symmetrical in all extremities and pronator drift was absent.  Bulk was normal and fasciculations were absent.   Motor Tone - Muscle tone was assessed at the neck and appendages and was normal.  Reflexes - The patient's reflexes were 1+ in all extremities and he had no pathological reflexes.  Sensory - Light touch, temperature/pinprick were assessed and were symmetrical.    Coordination - The patient had normal movements in the hands with no ataxia or dysmetria.  Tremor was absent.  Gait and Station - deferred   ASSESSMENT/PLAN [name] is a 83 y.o. male with history of AAA, carotid artery disease, CAD, CHF, hypertension, PUD with H. pylori infection, anemia, and acute renal failure in 2014 who presents to the hospital secondary to weakness for approximately 2 weeks. He did not receive IV t-PA due to arriving outside of the tPA treatment window/unclear last known well.   Stroke: Acute RIGHT PLIC/optic radiation infarct, likely small vessel disease, in setting of right M1 high grade stenosis  and acute hypotension/AKI/deconditioning  Resultant  Neuro baseline  CT head: No acute stroke  MRI head: Acute 1 cm RIGHT PLIC/optic radiation infarct.  CTA head and neck 07/2016 - right M1 75% stenosis, let ICA stent 70% stenosis  2D Echo (07/18/2016): EF 45-50%  LDL 50  HgbA1c 6.4  for VTE prophylaxis  Diet Heart Room service appropriate? Yes; Fluid consistency: Thin  aspirin 81 mg daily and Pradaxa (dabigatran) twice a day prior to admission, now on aspirin 81 mg daily and Pradaxa (dabigatran) twice a day. Continue without change.  Patient counseled to be compliant with his antithrombotic  medications  Ongoing aggressive stroke risk factor management  Therapy recommendations: Home health PT;Supervision/Assistance - 24 hour  Disposition:  Pending  Hypotension/dehydration/AKI/deconditioning  BP 90s - received IVF  Encourage increased po intake  AKI - Cre baseline 1.36, this admission 1.70  Failure to thrive at home  BP goal 120-150  afib on predaxa  EKG continued to show afib  Rate controlled  Continue predaxa   Hyperlipidemia  Home meds: atorvastatin 40 mg PO daily, resumed in hospital  LDL 40, goal < 70  Continue statin at discharge  Carotid stenosis s/p stent  S/p left CAS  02/2016 CUS - no significant stenosis  07/2016 CTA neck - left ICA stent 70% stenosis  Follows up with [facility] as scheduled.  Other Stroke Risk Factors  Advanced age  Former cigarette smoker, quit 2004  Hx of stroke - follows with Dr. Pearlean Brownie at [facility]  Other Active Problems  Moderate CKD, Stage 3B, GFR 42  AAA  Hospital day # 1  Neurology will sign off. Please call with questions. Pt will follow up with Dr. Pearlean Brownie at [facility] in about 6 weeks. Thanks for the consult.  Marvel Plan, MD PhD Stroke Neurology 10/10/2016 11:42 PM   To contact Stroke Continuity provider, please refer to [email or web address]. After hours, contact General Neurology

## 2016-10-10 NOTE — Care Management CC44 (Signed)
Condition Code 44 Documentation Completed  Patient Details  Name: [name] MRN: [id number] Date of Birth: 11-13-1926   Condition Code 44 given:    Patient signature on Condition Code 44 notice:    Documentation of 2 MD's agreement:    Code 44 added to claim:       Epifanio Lesches, RN 10/10/2016, 3:33 PM

## 2016-10-10 NOTE — Plan of Care (Signed)
Problem: Safety: Goal: Ability to remain free from injury will improve Outcome: Progressing Pt will remain free from falls and injuries during this hospitalization.  Problem: Education: Goal: Knowledge of secondary prevention will improve Outcome: Progressing Pt will be more knowledgeable about disease process prior to discharge.

## 2016-10-10 NOTE — Care Management CC44 (Signed)
Condition Code 44 Documentation Completed  Patient Details  Name: [name] MRN: [id number] Date of Birth: 1927/02/10   Condition Code 44 given:  Yes Patient signature on Condition Code 44 notice:  Yes Documentation of 2 MD's agreement:  Yes Code 44 added to claim:  Yes    Epifanio Lesches, RN 10/10/2016, 3:34 PM

## 2016-10-10 NOTE — Progress Notes (Signed)
PROGRESS NOTE    [name]  ZOX:[id number] DOB: 1926-09-15 DOA: 10/09/2016 PCP: [name], MD    Brief Narrative:  83 y/o with PMH listed below that presented with weakness and confusion. Was found to have new strokes on work up. Neurology consulted.   Assessment & Plan:   Principal Problem:   Acute metabolic encephalopathy/ Cerebral thrombosis with cerebral infarction - Secondary to new strokes -Neurology consulted and pt undergoing stroke w/u. Awaiting final recommendations.  Active Problems:   Hypertension -  Pt on carvedilol will need permissive htn, as such will d/c imdur    Hyperlipidemia -  Stable continue statin   Paroxysmal atrial fibrillation (HCC) - rate controlled on B blocker - Pt on pradaxa family member reports he was taking this regularly at home.    Hyponatremia - improving with fluids and improved oral solute intake    COPD, group B, by GOLD 2017 classification (HCC) - stable no wheezes, continue prn albuterol    Acute kidney injury superimposed on chronic kidney disease (HCC)  - serum creatinine trending down.   DVT prophylaxis: Pradaxa Code Status: Full Family Communication: None at bedside.  Disposition Plan: pending improvement in condition   Consultants:   Neurology   Procedures: undergoing stroke work up   Antimicrobials: None   Subjective:  No acute issues overnight.  Objective: Vitals:   10/10/16 0516 10/10/16 0829 10/10/16 1300 10/10/16 1547  BP: (!) 113/44  (!) 94/46 127/60  Pulse: 63  92 71  Resp: 16  20   Temp: 98.1 F (36.7 C)  97.9 F (36.6 C)   TempSrc: Oral  Oral   SpO2: 100% 94% 100%   Weight:      Height:        Intake/Output Summary (Last 24 hours) at 10/10/16 1836 Last data filed at 10/10/16 1000  Gross per 24 hour  Intake              120 ml  Output                0 ml  Net              120 ml   Filed Weights   10/09/16 0520  Weight: 63.5 kg (140 lb)    Examination:  General  exam: Appears calm and comfortable, in nad. Respiratory system: Clear to auscultation. Respiratory effort normal. Cardiovascular system: S1 & S2 heard, IRRR. No JVD, murmurs, rubs, gallops or clicks. No pedal edema. Gastrointestinal system: Abdomen is nondistended, soft and nontender. No organomegaly or masses felt. Normal bowel sounds heard. Central nervous system: alert and awake, no facial asymmetry Extremities: Symmetric 5 x 5 power. Skin: No rashes, lesions or ulcers, on limited exam. Psychiatry: unable to assess due to language barrier.    Data Reviewed: I have personally reviewed following labs and imaging studies  CBC:  Recent Labs Lab 10/08/16 1934 10/08/16 2001  WBC 6.8  --   HGB 9.9* 10.2*  HCT 29.1* 30.0*  MCV 93.6  --   PLT 173  --    Basic Metabolic Panel:  Recent Labs Lab 10/08/16 1934 10/08/16 2001 10/09/16 0400 10/09/16 0637  NA 124* 127* 128* 131*  K 4.6 4.6 3.9 3.8  CL 94* 93* 99* 103  CO2 22  --  21* 20*  GLUCOSE 101* 98 89 96  BUN 18 22* 17 16  CREATININE 1.80* 1.70* 1.56* 1.43*  CALCIUM 8.8*  --  8.6* 8.6*   GFR: Estimated Creatinine  Clearance: 28 mL/min (A) (by C-G formula based on SCr of 1.43 mg/dL (H)). Liver Function Tests:  Recent Labs Lab 10/08/16 1934  AST 21  ALT 13*  ALKPHOS 74  BILITOT 1.4*  PROT 7.8  ALBUMIN 3.4*    Recent Labs Lab 10/09/16 0100  LIPASE 31   No results for input(s): AMMONIA in the last 168 hours. Coagulation Profile: No results for input(s): INR, PROTIME in the last 168 hours. Cardiac Enzymes:  Recent Labs Lab 10/09/16 0400 10/09/16 0637 10/09/16 1039 10/09/16 1445  CKTOTAL  --   --  82  --   TROPONINI <0.03 <0.03  --  <0.03   BNP (last 3 results) No results for input(s): PROBNP in the last 8760 hours. HbA1C: No results for input(s): HGBA1C in the last 72 hours. CBG: No results for input(s): GLUCAP in the last 168 hours. Lipid Profile: No results for input(s): CHOL, HDL, LDLCALC, TRIG,  CHOLHDL, LDLDIRECT in the last 72 hours. Thyroid Function Tests:  Recent Labs  10/09/16 1039  TSH 1.574   Anemia Panel: No results for input(s): VITAMINB12, FOLATE, FERRITIN, TIBC, IRON, RETICCTPCT in the last 72 hours. Sepsis Labs:  Recent Labs Lab 10/08/16 1959 10/09/16 0123  LATICACIDVEN 0.87 1.42    No results found for this or any previous visit (from the past 240 hour(s)).       Radiology Studies: Dg Chest 2 View  Result Date: 10/08/2016 CLINICAL DATA:  Cough altered mental status EXAM: CHEST  2 VIEW COMPARISON:  10/27/2015 FINDINGS: Slightly coarse interstitial pattern likely chronic. No acute consolidation or effusion. Resolution of left mid lung opacity since prior radiograph. Stable cardiomediastinal silhouette with atherosclerosis. Negative for pneumothorax. Degenerative changes of the spine. IMPRESSION: No active cardiopulmonary disease. Electronically Signed   By: Jasmine Pang M.D.   On: 10/08/2016 20:12   Ct Head Wo Contrast  Result Date: 10/08/2016 CLINICAL DATA:  Altered mental status EXAM: CT HEAD WITHOUT CONTRAST TECHNIQUE: Contiguous axial images were obtained from the base of the skull through the vertex without intravenous contrast. COMPARISON:  07/18/2016 CT and MRI FINDINGS: Brain: Moderate cerebral atrophy with chronic appearing small vessel ischemic disease. Remote bilateral infarcts again noted with encephalomalacia involving the posterior right frontal and high left parietal lobes. Vascular: Moderate atherosclerosis of the carotid siphons. No hyperdense vessels. Skull: The scalp soft tissues and bony calvarium are unremarkable. Sinuses/Orbits: Bilateral lens replacements. Intact orbits and globes. No acute paranasal sinus disease. Chronic right mastoid effusion. Other: None IMPRESSION: 1. Cerebral atrophy with chronic infarcts in the posterior right frontal and high left parietal lobes. Moderate degree of small vessel ischemia, chronic in appearance. No  acute intracranial abnormality. 2. Chronic right mastoid effusion. Electronically Signed   By: Tollie Eth M.D.   On: 10/08/2016 21:30   Mr Brain Wo Contrast  Result Date: 10/09/2016 CLINICAL DATA:  Two days of weakness and confusion, dizziness and presyncope. History of hypertension, carotid artery disease and stroke. EXAM: MRI HEAD WITHOUT CONTRAST TECHNIQUE: Multiplanar, multiecho pulse sequences of the brain and surrounding structures were obtained without intravenous contrast. COMPARISON:  MRI of the head July 18, 2016 and CT HEAD October 08, 2016 FINDINGS: BRAIN: 1 cm ovoid reduced diffusion RIGHT posterior limb of the internal capsule to optic radiations. Corresponding low ADC values. Stable RIGHT thalamus and basal ganglia to internal capsule old micro hemorrhages. Chronic LEFT frontal microhemorrhage. Ventricles and sulci are normal for patient's age. Small area RIGHT frontal, LEFT occipital and and multifocal LEFT frontoparietal encephalomalacia. Old  LEFT thalamus and LEFT caudate lacunar infarcts. Patchy to confluent supratentorial white matter FLAIR T2 hyperintensities. No midline shift, mass effect or masses. Prominent basal ganglia perivascular spaces associated with chronic small vessel ischemic disease. Stable 9 mm LEFT frontal extra-axial extra-axial space. VASCULAR: Normal major intracranial vascular flow voids present at skull base. SKULL AND UPPER CERVICAL SPINE: No abnormal sellar expansion. No suspicious calvarial bone marrow signal. Craniocervical junction maintained. SINUSES/ORBITS: RIGHT mastoid effusion. Included paranasal sinuses are well-aerated. The included ocular globes and orbital contents are non-suspicious. OTHER: Patient is edentulous. IMPRESSION: 1. Acute 1 cm RIGHT posterior limb of internal capsule infarct. 2. Old RIGHT frontal (MCA territory), LEFT occipital lobe (PCA territory) and LEFT frontoparietal (MCA territory) infarcts. Old LEFT lacunar infarcts. Moderate to severe  chronic small vessel ischemic disease. 3. Old small LEFT frontal subdural hematoma versus hygroma. Electronically Signed   By: Awilda Metro M.D.   On: 10/09/2016 22:54   Dg Chest Port 1 View  Result Date: 10/10/2016 CLINICAL DATA:  CHF EXAM: PORTABLE CHEST 1 VIEW COMPARISON:  10/08/2016 chest radiograph. FINDINGS: Stable cardiomediastinal silhouette with mild cardiomegaly and aortic atherosclerosis. No pneumothorax. No pleural effusion. Lungs appear clear, with no acute consolidative airspace disease and no pulmonary edema. IMPRESSION: Stable mild cardiomegaly without pulmonary edema. No active pulmonary disease. Electronically Signed   By: Delbert Phenix M.D.   On: 10/10/2016 16:35        Scheduled Meds: . allopurinol  300 mg Oral Daily  . arformoterol  15 mcg Nebulization BID  . aspirin EC  81 mg Oral Daily  . atorvastatin  40 mg Oral q1800  . budesonide  0.5 mg Nebulization BID  . carvedilol  3.125 mg Oral BID WC  . dabigatran  75 mg Oral Q12H  . feeding supplement (ENSURE ENLIVE)  237 mL Oral BID BM  . isosorbide mononitrate  30 mg Oral Daily  . mouth rinse  15 mL Mouth Rinse BID  . pantoprazole  40 mg Oral BID  . tamsulosin  0.4 mg Oral Daily   Continuous Infusions: . sodium chloride 30 mL/hr at 10/10/16 1553     LOS: 1 day    Time spent: > 20 minutes    Penny Pia, MD [facility] Pager [phone]  If 7PM-7AM, please contact night-coverage [email or web address] Password [facility] 10/10/2016, 6:36 PM

## 2016-10-11 DIAGNOSIS — E871 Hypo-osmolality and hyponatremia: Secondary | ICD-10-CM | POA: Diagnosis not present

## 2016-10-11 DIAGNOSIS — R079 Chest pain, unspecified: Secondary | ICD-10-CM | POA: Diagnosis not present

## 2016-10-11 DIAGNOSIS — I480 Paroxysmal atrial fibrillation: Secondary | ICD-10-CM | POA: Diagnosis not present

## 2016-10-11 DIAGNOSIS — M109 Gout, unspecified: Secondary | ICD-10-CM | POA: Diagnosis not present

## 2016-10-11 DIAGNOSIS — I2510 Atherosclerotic heart disease of native coronary artery without angina pectoris: Secondary | ICD-10-CM | POA: Diagnosis not present

## 2016-10-11 DIAGNOSIS — R42 Dizziness and giddiness: Secondary | ICD-10-CM | POA: Diagnosis not present

## 2016-10-11 DIAGNOSIS — J441 Chronic obstructive pulmonary disease with (acute) exacerbation: Secondary | ICD-10-CM | POA: Diagnosis not present

## 2016-10-11 DIAGNOSIS — I959 Hypotension, unspecified: Secondary | ICD-10-CM | POA: Diagnosis not present

## 2016-10-11 DIAGNOSIS — I130 Hypertensive heart and chronic kidney disease with heart failure and stage 1 through stage 4 chronic kidney disease, or unspecified chronic kidney disease: Secondary | ICD-10-CM | POA: Diagnosis not present

## 2016-10-11 DIAGNOSIS — I739 Peripheral vascular disease, unspecified: Secondary | ICD-10-CM | POA: Diagnosis not present

## 2016-10-11 DIAGNOSIS — E860 Dehydration: Secondary | ICD-10-CM | POA: Diagnosis not present

## 2016-10-11 DIAGNOSIS — I714 Abdominal aortic aneurysm, without rupture: Secondary | ICD-10-CM | POA: Diagnosis not present

## 2016-10-11 DIAGNOSIS — H9190 Unspecified hearing loss, unspecified ear: Secondary | ICD-10-CM | POA: Diagnosis not present

## 2016-10-11 DIAGNOSIS — E785 Hyperlipidemia, unspecified: Secondary | ICD-10-CM | POA: Diagnosis not present

## 2016-10-11 DIAGNOSIS — G9341 Metabolic encephalopathy: Secondary | ICD-10-CM | POA: Diagnosis not present

## 2016-10-11 DIAGNOSIS — R531 Weakness: Secondary | ICD-10-CM | POA: Diagnosis present

## 2016-10-11 DIAGNOSIS — I6529 Occlusion and stenosis of unspecified carotid artery: Secondary | ICD-10-CM | POA: Diagnosis not present

## 2016-10-11 DIAGNOSIS — N179 Acute kidney failure, unspecified: Secondary | ICD-10-CM | POA: Diagnosis not present

## 2016-10-11 DIAGNOSIS — R627 Adult failure to thrive: Secondary | ICD-10-CM | POA: Diagnosis not present

## 2016-10-11 DIAGNOSIS — I5022 Chronic systolic (congestive) heart failure: Secondary | ICD-10-CM | POA: Diagnosis not present

## 2016-10-11 DIAGNOSIS — N183 Chronic kidney disease, stage 3 (moderate): Secondary | ICD-10-CM | POA: Diagnosis not present

## 2016-10-11 DIAGNOSIS — N400 Enlarged prostate without lower urinary tract symptoms: Secondary | ICD-10-CM | POA: Diagnosis not present

## 2016-10-11 DIAGNOSIS — Z8679 Personal history of other diseases of the circulatory system: Secondary | ICD-10-CM | POA: Diagnosis not present

## 2016-10-11 DIAGNOSIS — R29702 NIHSS score 2: Secondary | ICD-10-CM | POA: Diagnosis not present

## 2016-10-11 DIAGNOSIS — I6340 Cerebral infarction due to embolism of unspecified cerebral artery: Secondary | ICD-10-CM | POA: Diagnosis not present

## 2016-10-11 LAB — BASIC METABOLIC PANEL
Anion gap: 8 (ref 5–15)
BUN: 24 mg/dL — AB (ref 6–20)
CO2: 22 mmol/L (ref 22–32)
CREATININE: 1.54 mg/dL — AB (ref 0.61–1.24)
Calcium: 8.7 mg/dL — ABNORMAL LOW (ref 8.9–10.3)
Chloride: 101 mmol/L (ref 101–111)
GFR, EST AFRICAN AMERICAN: 45 mL/min — AB (ref >60)
GFR, EST NON AFRICAN AMERICAN: 39 mL/min — AB (ref >60)
Glucose, Bld: 121 mg/dL — ABNORMAL HIGH (ref 65–99)
POTASSIUM: 4.4 mmol/L (ref 3.5–5.1)
SODIUM: 131 mmol/L — AB (ref 135–145)

## 2016-10-11 LAB — CBC
HEMATOCRIT: 27.5 % — AB (ref 39.0–52.0)
HEMOGLOBIN: 9.4 g/dL — AB (ref 13.0–17.0)
MCH: 31.8 pg (ref 26.0–34.0)
MCHC: 34.2 g/dL (ref 30.0–36.0)
MCV: 92.9 fL (ref 78.0–100.0)
PLATELETS: 151 10*3/uL (ref 150–400)
RBC: 2.96 MIL/uL — ABNORMAL LOW (ref 4.22–5.81)
RDW: 15.3 % (ref 11.5–15.5)
WBC: 6.4 10*3/uL (ref 4.0–10.5)

## 2016-10-11 NOTE — Progress Notes (Addendum)
MD was called because patient has order to be D/C home but wright now he is on oxygen and we need to check saturation on ambulation to check if he will need oxygen at home. Also his daughter called RN and she expressed concern that her father is still very weak and she is asking if PT saw him today. Per MD verbally order patient will be D/C tomorrow 10/12/2016. Will continue to monitor.

## 2016-10-11 NOTE — Discharge Summary (Addendum)
Physician Discharge Summary  [name] NFA:[id number] DOB: [birth year] DOA: 10/09/2016  PCP: [name], MD  Admit date: 10/09/2016 Discharge date: 10/11/2016  Time spent: > 35 minutes  Recommendations for Outpatient Follow-up:  Ensure patient f/u with Neurologist moving forward Pt admitted as inpatient.  Discharge Diagnoses:  Principal Problem:   Acute metabolic encephalopathy Active Problems:   Hypertension   Hyperlipidemia   Paroxysmal atrial fibrillation (HCC)   Hyponatremia   Dizzy   COPD, group B, by GOLD 2017 classification (HCC)   Acute kidney injury superimposed on chronic kidney disease (HCC)   Cerebral thrombosis with cerebral infarction   Discharge Condition: stable  Diet recommendation: heart healthy  Filed Weights   10/09/16 0520 10/11/16 0647  Weight: 63.5 kg (140 lb) 65 kg (143 lb 4.8 oz)    History of present illness:  83 y/o with PMH listed below that presented with weakness and confusion. Was found to have new strokes on work up. Neurology consulted.  Hospital Course:   Per Neurology: Stroke: Acute RIGHT PLIC/optic radiation infarct, likely small vessel disease, in setting of right M1 high grade stenosis and acute hypotension/AKI/deconditioning  Resultant  Neuro baseline  CT head: No acute stroke  MRI head: Acute 1 cm RIGHT PLIC/optic radiation infarct.  CTA head and neck 07/2016 - right M1 75% stenosis, let ICA stent 70% stenosis  2D Echo (07/18/2016): EF 45-50%  LDL 50  HgbA1c 6.4  for VTE prophylaxis  Diet Heart Room service appropriate? Yes; Fluid consistency: Thin  aspirin 81 mg daily and Pradaxa (dabigatran) twice a day prior to admission, now on aspirin 81 mg daily and Pradaxa (dabigatran) twice a day. Continue without change.  Patient counseled to be compliant with his antithrombotic medications  Ongoing aggressive stroke risk factor management  Therapy recommendations: Home health PT;Supervision/Assistance - 24  hour  Hypotension/dehydration/AKI/deconditioning  BP 90s - received IVF  Encourage increased po intake  AKI - Cre baseline 1.36, this admission 1.70  Failure to thrive at home  BP goal 120-150  afib on predaxa  EKG continued to show afib  Rate controlled  Continue predaxa   Hyperlipidemia  Home meds: atorvastatin 40 mg PO daily, resumed in hospital  LDL 40, goal < 70  Continue statin at discharge  Carotid stenosis s/p stent  S/p left CAS  02/2016 CUS - no significant stenosis  07/2016 CTA neck - left ICA stent 70% stenosis  Follows up with [facility] as scheduled.  Other Stroke Risk Factors  Advanced age  Former cigarette smoker, quit 2004  Hx of stroke - follows with Dr. Pearlean Brownie at [facility]  Other Active Problems  Moderate CKD, Stage 3B, GFR 42  AAA   Procedures: Ct Head Wo Contrast 10/08/2016 IMPRESSION: 1. Cerebral atrophy with chronic infarcts in the posterior right frontal and high left parietal lobes. Moderate degree of small vessel ischemia, chronic in appearance. No acute intracranial abnormality. 2. Chronic right mastoid effusion.   Mr Brain Wo Contrast 10/09/2016 IMPRESSION: 1. Acute 1 cm RIGHT posterior limb of internal capsule infarct. 2. Old RIGHT frontal (MCA territory), LEFT occipital lobe (PCA territory) and LEFT frontoparietal (MCA territory) infarcts. Old LEFT lacunar infarcts. Moderate to severe chronic small vessel ischemic disease. 3. Old small LEFT frontal subdural hematoma versus hygroma.   TTE 07/18/2016 Study Conclusions - Left ventricle: The cavity size was normal. There was mild concentric hypertrophy. Systolic function was mildly reduced. Theestimated ejection fraction was in the range of 45% to 50%. Thereis akinesis of the basalinferior myocardium.  There is hypokinesisof the entireinferolateral myocardium. - Aortic valve: There was mild regurgitation. Valve area (VTI):1.66 cm^2. Valve area (Vmax): 2.34 cm^2. Valve area  (Vmean): 2.05cm^2. - Mitral valve: There was moderate regurgitation. - Left atrium: The atrium was moderately dilated. - Right atrium: The atrium was mildly dilated. - Tricuspid valve: There was moderate regurgitation. - Pulmonic valve: There was trivial regurgitation. - Pulmonary arteries: PA peak pressure: 41 mm Hg (S). - Pericardium, extracardiac: A trivial, free-flowing pericardial effusion was identified along the right ventricular free wall. Impressions: - The right ventricular systolic pressure was increased consistent with moderate pulmonary hypertension.  CTA head and neck 07/18/16 1. Negative CTA for emergent large vessel occlusion. 2. Negative CT perfusion. No evidence for acute infarct or perfusion mismatch. 3. Moderate to severe diffuse narrowing of the mid-distal right M1 segment to approximately 75%. This is presumably chronic in nature given the lack of perfusion mismatch. 4. Patent left carotid artery stent. Moderate narrowing at the distal aspect of the stent to approximately 70% 5. Prominent atheromatous disease involving the visualized aortic arch, with additional mild to moderate multifocal atherosclerotic changes as above.   Consultations:  Neurology  Discharge Exam: Vitals:   10/11/16 0830 10/11/16 1402  BP:  (!) 143/56  Pulse:  64  Resp:  16  Temp:  98.1 F (36.7 C)  SpO2: 100% 100%    General: Pt in nad, alert and awake Cardiovascular: rrr, no rubs Respiratory: no increased wob, no wheezes  Discharge Instructions   Discharge Instructions    Ambulatory referral to Neurology    Complete by:  As directed    Pt will follow up with Dr. Pearlean Brownie at [facility] in about 2 months. Thanks.   Call MD for:  severe uncontrolled pain    Complete by:  As directed    Call MD for:  temperature >100.4    Complete by:  As directed    Diet - low sodium heart healthy    Complete by:  As directed    Increase activity slowly    Complete by:  As directed       Current Discharge Medication List    CONTINUE these medications which have NOT CHANGED   Details  allopurinol (ZYLOPRIM) 300 MG tablet Take 300 mg by mouth daily. To prevent gout    arformoterol (BROVANA) 15 MCG/2ML NEBU Take 2 mLs (15 mcg total) by nebulization 2 (two) times daily. Qty: 120 mL, Refills: 2    aspirin EC 81 MG tablet Take 1 tablet (81 mg total) by mouth daily. 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 Qty: 30 tablet, Refills: 5    budesonide (PULMICORT) 0.5 MG/2ML nebulizer solution Take 2 mLs (0.5 mg total) by nebulization 2 (two) times daily. Qty: 2 mL, Refills: 12    carvedilol (COREG) 6.25 MG tablet Take 1 tablet (6.25 mg total) by mouth 2 (two) times daily with a meal. Qty: 60 tablet, Refills: 6    dabigatran (PRADAXA) 75 MG CAPS capsule Take 1 capsule (75 mg total) by mouth every 12 (twelve) hours. Qty: 60 capsule, Refills: 0    esomeprazole (NEXIUM) 40 MG capsule Take 1 capsule (40 mg total) by mouth 2 (two) times daily before a meal.    isosorbide mononitrate (IMDUR) 30 MG 24 hr tablet Take 30  mg by mouth once daily Qty: 15 tablet, Refills: 9    meclizine (ANTIVERT) 25 MG tablet Take 25 mg by mouth every 8 (eight) hours.     NITROSTAT 0.4 MG SL tablet PLACE 1 TABLET UNDER THE TONGUE EVERY 5 MINUTES FOR 3 DOSES AS NEEDED FOR CHEST PAIN Qty: 25 tablet, Refills: 5    tamsulosin (FLOMAX) 0.4 MG CAPS capsule Take 0.4 mg by mouth daily. To improve bladder function       Allergies  Allergen Reactions  . Levofloxacin Anaphylaxis and Other (See Comments)    Patient told his daughter that it made him "feel worse than he already did" (overall) Headache also (has refused to take anymore)  . Eliquis [Apixaban] Itching and Swelling  . Phenazopyridine Hcl Other (See  Comments)    Unknown reaction  . Septra [Sulfamethoxazole-Trimethoprim] Nausea And Vomiting    GI Upset  . Latex Rash  . Tape Rash   Follow-up Information    Micki Riley, MD. Schedule an appointment as soon as possible for a visit in 6 week(s).   Specialties:  Neurology, Radiology Contact information: [address] [place] [address] [phone]            The results of significant diagnostics from this hospitalization (including imaging, microbiology, ancillary and laboratory) are listed below for reference.    Significant Diagnostic Studies: Dg Chest 2 View  Result Date: 10/08/2016 CLINICAL DATA:  Cough altered mental status EXAM: CHEST  2 VIEW COMPARISON:  10/27/2015 FINDINGS: Slightly coarse interstitial pattern likely chronic. No acute consolidation or effusion. Resolution of left mid lung opacity since prior radiograph. Stable cardiomediastinal silhouette with atherosclerosis. Negative for pneumothorax. Degenerative changes of the spine. IMPRESSION: No active cardiopulmonary disease. Electronically Signed   By: Jasmine Pang M.D.   On: 10/08/2016 20:12   Ct Head Wo Contrast  Result Date: 10/08/2016 CLINICAL DATA:  Altered mental status EXAM: CT HEAD WITHOUT CONTRAST TECHNIQUE: Contiguous axial images were obtained from the base of the skull through the vertex without intravenous contrast. COMPARISON:  07/18/2016 CT and MRI FINDINGS: Brain: Moderate cerebral atrophy with chronic appearing small vessel ischemic disease. Remote bilateral infarcts again noted with encephalomalacia involving the posterior right frontal and high left parietal lobes. Vascular: Moderate atherosclerosis of the carotid siphons. No hyperdense vessels. Skull: The scalp soft tissues and bony calvarium are unremarkable. Sinuses/Orbits: Bilateral lens replacements. Intact orbits and globes. No acute paranasal sinus disease. Chronic right mastoid effusion. Other: None IMPRESSION: 1. Cerebral  atrophy with chronic infarcts in the posterior right frontal and high left parietal lobes. Moderate degree of small vessel ischemia, chronic in appearance. No acute intracranial abnormality. 2. Chronic right mastoid effusion. Electronically Signed   By: Tollie Eth M.D.   On: 10/08/2016 21:30   Mr Brain Wo Contrast  Result Date: 10/09/2016 CLINICAL DATA:  Two days of weakness and confusion, dizziness and presyncope. History of hypertension, carotid artery disease and stroke. EXAM: MRI HEAD WITHOUT CONTRAST TECHNIQUE: Multiplanar, multiecho pulse sequences of the brain and surrounding structures were obtained without intravenous contrast. COMPARISON:  MRI of the head July 18, 2016 and CT HEAD October 08, 2016 FINDINGS: BRAIN: 1 cm ovoid reduced diffusion RIGHT posterior limb of the internal capsule to optic radiations. Corresponding low ADC values. Stable RIGHT thalamus and basal ganglia to internal capsule old micro hemorrhages. Chronic LEFT frontal microhemorrhage. Ventricles and sulci are normal for patient's age. Small area RIGHT frontal, LEFT occipital and and multifocal LEFT frontoparietal encephalomalacia. Old  LEFT thalamus and LEFT caudate lacunar infarcts. Patchy to confluent supratentorial white matter FLAIR T2 hyperintensities. No midline shift, mass effect or masses. Prominent basal ganglia perivascular spaces associated with chronic small vessel ischemic disease. Stable 9 mm LEFT frontal extra-axial extra-axial space. VASCULAR: Normal major intracranial vascular flow voids present at skull base. SKULL AND UPPER CERVICAL SPINE: No abnormal sellar expansion. No suspicious calvarial bone marrow signal. Craniocervical junction maintained. SINUSES/ORBITS: RIGHT mastoid effusion. Included paranasal sinuses are well-aerated. The included ocular globes and orbital contents are non-suspicious. OTHER: Patient is edentulous. IMPRESSION: 1. Acute 1 cm RIGHT posterior limb of internal capsule infarct. 2. Old RIGHT  frontal (MCA territory), LEFT occipital lobe (PCA territory) and LEFT frontoparietal (MCA territory) infarcts. Old LEFT lacunar infarcts. Moderate to severe chronic small vessel ischemic disease. 3. Old small LEFT frontal subdural hematoma versus hygroma. Electronically Signed   By: Awilda Metro M.D.   On: 10/09/2016 22:54   Dg Chest Port 1 View  Result Date: 10/10/2016 CLINICAL DATA:  CHF EXAM: PORTABLE CHEST 1 VIEW COMPARISON:  10/08/2016 chest radiograph. FINDINGS: Stable cardiomediastinal silhouette with mild cardiomegaly and aortic atherosclerosis. No pneumothorax. No pleural effusion. Lungs appear clear, with no acute consolidative airspace disease and no pulmonary edema. IMPRESSION: Stable mild cardiomegaly without pulmonary edema. No active pulmonary disease. Electronically Signed   By: Delbert Phenix M.D.   On: 10/10/2016 16:35    Microbiology: No results found for this or any previous visit (from the past 240 hour(s)).   Labs: Basic Metabolic Panel:  Recent Labs Lab 10/08/16 1934 10/08/16 2001 10/09/16 0400 10/09/16 0637 10/11/16 0044  NA 124* 127* 128* 131* 131*  K 4.6 4.6 3.9 3.8 4.4  CL 94* 93* 99* 103 101  CO2 22  --  21* 20* 22  GLUCOSE 101* 98 89 96 121*  BUN 18 22* 17 16 24*  CREATININE 1.80* 1.70* 1.56* 1.43* 1.54*  CALCIUM 8.8*  --  8.6* 8.6* 8.7*   Liver Function Tests:  Recent Labs Lab 10/08/16 1934  AST 21  ALT 13*  ALKPHOS 74  BILITOT 1.4*  PROT 7.8  ALBUMIN 3.4*    Recent Labs Lab 10/09/16 0100  LIPASE 31   No results for input(s): AMMONIA in the last 168 hours. CBC:  Recent Labs Lab 10/08/16 1934 10/08/16 2001 10/11/16 0044  WBC 6.8  --  6.4  HGB 9.9* 10.2* 9.4*  HCT 29.1* 30.0* 27.5*  MCV 93.6  --  92.9  PLT 173  --  151   Cardiac Enzymes:  Recent Labs Lab 10/09/16 0400 10/09/16 0637 10/09/16 1039 10/09/16 1445  CKTOTAL  --   --  82  --   TROPONINI <0.03 <0.03  --  <0.03   BNP: BNP (last 3 results) No results for  input(s): BNP in the last 8760 hours.  ProBNP (last 3 results) No results for input(s): PROBNP in the last 8760 hours.  CBG: No results for input(s): GLUCAP in the last 168 hours.   Signed:  Penny Pia MD.  [facility] 10/11/2016, 6:02 PM  Pt with activity has had hypoxia. As such will d/c with supplemental oxygen.  Will d/c with DME oxygen 2 L per minute continuous with activity pt will need stationary and portable oxygen unit.

## 2016-10-12 DIAGNOSIS — R531 Weakness: Secondary | ICD-10-CM | POA: Diagnosis not present

## 2016-10-12 DIAGNOSIS — I6340 Cerebral infarction due to embolism of unspecified cerebral artery: Secondary | ICD-10-CM | POA: Diagnosis not present

## 2016-10-12 NOTE — Progress Notes (Signed)
MD made aware about the patient's need of oxygen at home. On ambulation sat O2 is dropping at 85% on room air and on 2L via [place] sat O2 is 93%. During ambulation on room air patient was complaining of SOB. Will continue to monitor.

## 2016-10-12 NOTE — Progress Notes (Signed)
Patient has order to be D/C. He and his wife and daughter are waiting for the oxygen tank to be delivered. Will continue to monitor.

## 2016-10-12 NOTE — Progress Notes (Addendum)
SATURATION QUALIFICATIONS: (This note is used to comply with regulatory documentation for home oxygen)  Patient Saturations on Room Air at Rest = 97%  Patient Saturations on Room Air while Ambulating = 85%  Patient Saturations on 2 Liters of oxygen while Ambulating = 93%  Please briefly explain why patient needs home oxygen:patient needs oxygen at home when he is ambulating. Neb meds was a method tried and failed patient still requires oxygen

## 2016-10-12 NOTE — Care Management Note (Addendum)
Case Management Note  Patient Details  Name: [name] MRN: [id number] Date of Birth: 1926/12/27  Subjective/Objective:       Acute encephalopathy, HTN, COPD, CVA             Action/Plan: Discharge Planning: NCM spoke to pt's dtr, [place]. Requesting RW with seat, and bedside commode. Contacted [facility] DME rep for equipment for home. Contacted [facility] rep to make aware dc home with HH. Contacted [facility] for oxygen for home.   PCP Benedetto Goad MD  Expected Discharge Date:  10/12/16               Expected Discharge Plan:  Home w Home Health Services  In-House Referral:  NA  Discharge planning Services  CM Consult  Post Acute Care Choice:  Home Health Choice offered to:  Adult Children, Patient  DME Arranged:  3-N-1, Walker rolling with seat DME Agency:  [facility]  HH Arranged:  PT HH Agency:  [facility]  Status of Service:  Completed, signed off  If discussed at Long Length of Stay Meetings, dates discussed:    Additional Comments:  Elliot Cousin, RN 10/12/2016, 11:00 AM

## 2016-10-12 NOTE — Progress Notes (Signed)
Patient was discharged home with home health by MD order; discharged instructions  review and give to patient with care notes; IV DIC; oxygen tank delivered to the patient's room also RW with seat, and bedside commode. Patient will be escorted to the car by nurse tech via wheelchair.

## 2016-10-12 NOTE — Progress Notes (Signed)
Physical Therapy Treatment Patient Details Name: [name] MRN: [id number] DOB: [birth year] Today's Date: 10/12/2016    History of Present Illness 83 year old male with 2 week hx of progressively getting weaker, having falls, dizziness, failure to thrive, decreased drinking and eating.  PMH: CVA, anemia, CAD, CHF, AAA, renal failure    PT Comments    Continuing work on functional mobility and activity tolerance;  Performed O2 qualifying walk, see General Coments for details; Pt more willing to use RW today   Follow Up Recommendations  Home health PT     Equipment Recommendations  Other (comment) (Oxygen) Rollator   Recommendations for Other Services       Precautions / Restrictions Precautions Precautions: Fall Precaution Comments: watch O2 sats    Mobility  Bed Mobility                  Transfers Overall transfer level: Needs assistance Equipment used: 1 person hand held assist Transfers: Sit to/from Stand Sit to Stand: Min assist         General transfer comment: min assist to steady  Ambulation/Gait Ambulation/Gait assistance: Min guard Ambulation Distance (Feet): 150 Feet Assistive device: Rolling walker (2 wheeled) Gait Pattern/deviations: Step-through pattern;Decreased stride length;Trunk flexed     General Gait Details: Used RW this session with some success; states he "will try" to use RW at home per daughter; desatted with amb; see general comments   Stairs            Wheelchair Mobility    Modified Rankin (Stroke Patients Only)       Balance     Sitting balance-Leahy Scale: Good       Standing balance-Leahy Scale: Poor                              Cognition Arousal/Alertness: Awake/alert Behavior During Therapy: WFL for tasks assessed/performed Overall Cognitive Status: Within Functional Limits for tasks assessed                                        Exercises      General  Comments General comments (skin integrity, edema, etc.): Daughter present and helped with translation; discussed with RN, who indicated he speaks a dialect of [place] that the phone and video interpreters do not understand;   SATURATION QUALIFICATIONS: (This note is used to comply with regulatory documentation for home oxygen)  Patient Saturations on Room Air at Rest = 97%  Patient Saturations on Room Air while Ambulating = 85%  Patient Saturations on 2 Liters of oxygen while Ambulating = 93%  Please briefly explain why patient needs home oxygen: Patient requires supplemental oxygen to maintain oxygen saturations at acceptable, safe levels with physical activity.       Pertinent Vitals/Pain Pain Assessment: No/denies pain    Home Living                      Prior Function            PT Goals (current goals can now be found in the care plan section) Acute Rehab PT Goals Patient Stated Goal: Daughter wants to figure out what is going on PT Goal Formulation: With family Time For Goal Achievement: 10/23/16 Potential to Achieve Goals: Good Progress towards PT goals: Progressing toward goals    Frequency  Min 3X/week      PT Plan Current plan remains appropriate    Co-evaluation              AM-PAC PT "6 Clicks" Daily Activity  Outcome Measure  Difficulty turning over in bed (including adjusting bedclothes, sheets and blankets)?: None Difficulty moving from lying on back to sitting on the side of the bed? : None Difficulty sitting down on and standing up from a chair with arms (e.g., wheelchair, bedside commode, etc,.)?: A Lot Help needed moving to and from a bed to chair (including a wheelchair)?: A Little Help needed walking in hospital room?: A Little Help needed climbing 3-5 steps with a railing? : A Little 6 Click Score: 19    End of Session Equipment Utilized During Treatment: Gait belt;Oxygen Activity Tolerance: Patient tolerated treatment  well Patient left: with call bell/phone within reach;with family/visitor present;in bed Nurse Communication: Mobility status PT Visit Diagnosis: Unsteadiness on feet (R26.81);Difficulty in walking, not elsewhere classified (R26.2);Muscle weakness (generalized) (M62.81)     Time: 1610-9604 PT Time Calculation (min) (ACUTE ONLY): 23 min  Charges:  $Gait Training: 8-22 mins                    G Codes:       [name], [name]  Acute Rehabilitation Services Pager [phone] Office [phone]    [name] 10/12/2016, 12:16 PM

## 2016-10-18 ENCOUNTER — Inpatient Hospital Stay (HOSPITAL_COMMUNITY)
Admission: EM | Admit: 2016-10-18 | Discharge: 2016-10-20 | DRG: 064 | Disposition: A | Discharge status: Home Health | Payer: Medicare Other | Attending: Internal Medicine | Admitting: Internal Medicine

## 2016-10-18 ENCOUNTER — Emergency Department (HOSPITAL_COMMUNITY): Payer: Medicare Other

## 2016-10-18 ENCOUNTER — Other Ambulatory Visit: Payer: Self-pay

## 2016-10-18 ENCOUNTER — Encounter (HOSPITAL_COMMUNITY): Payer: Self-pay | Admitting: *Deleted

## 2016-10-18 DIAGNOSIS — G92 Toxic encephalopathy: Secondary | ICD-10-CM | POA: Diagnosis present

## 2016-10-18 DIAGNOSIS — I5022 Chronic systolic (congestive) heart failure: Secondary | ICD-10-CM | POA: Diagnosis present

## 2016-10-18 DIAGNOSIS — I482 Chronic atrial fibrillation: Secondary | ICD-10-CM | POA: Diagnosis present

## 2016-10-18 DIAGNOSIS — Z66 Do not resuscitate: Secondary | ICD-10-CM | POA: Diagnosis present

## 2016-10-18 DIAGNOSIS — I639 Cerebral infarction, unspecified: Principal | ICD-10-CM | POA: Diagnosis present

## 2016-10-18 DIAGNOSIS — Z789 Other specified health status: Secondary | ICD-10-CM | POA: Diagnosis present

## 2016-10-18 DIAGNOSIS — R29706 NIHSS score 6: Secondary | ICD-10-CM | POA: Diagnosis present

## 2016-10-18 DIAGNOSIS — Z87891 Personal history of nicotine dependence: Secondary | ICD-10-CM

## 2016-10-18 DIAGNOSIS — I714 Abdominal aortic aneurysm, without rupture: Secondary | ICD-10-CM | POA: Diagnosis present

## 2016-10-18 DIAGNOSIS — Z79899 Other long term (current) drug therapy: Secondary | ICD-10-CM

## 2016-10-18 DIAGNOSIS — F329 Major depressive disorder, single episode, unspecified: Secondary | ICD-10-CM | POA: Diagnosis present

## 2016-10-18 DIAGNOSIS — I10 Essential (primary) hypertension: Secondary | ICD-10-CM | POA: Diagnosis not present

## 2016-10-18 DIAGNOSIS — R4701 Aphasia: Secondary | ICD-10-CM | POA: Diagnosis present

## 2016-10-18 DIAGNOSIS — H9190 Unspecified hearing loss, unspecified ear: Secondary | ICD-10-CM | POA: Diagnosis present

## 2016-10-18 DIAGNOSIS — R531 Weakness: Secondary | ICD-10-CM | POA: Diagnosis not present

## 2016-10-18 DIAGNOSIS — I130 Hypertensive heart and chronic kidney disease with heart failure and stage 1 through stage 4 chronic kidney disease, or unspecified chronic kidney disease: Secondary | ICD-10-CM | POA: Diagnosis present

## 2016-10-18 DIAGNOSIS — M109 Gout, unspecified: Secondary | ICD-10-CM | POA: Diagnosis present

## 2016-10-18 DIAGNOSIS — Z7901 Long term (current) use of anticoagulants: Secondary | ICD-10-CM

## 2016-10-18 DIAGNOSIS — Z7982 Long term (current) use of aspirin: Secondary | ICD-10-CM

## 2016-10-18 DIAGNOSIS — Z9109 Other allergy status, other than to drugs and biological substances: Secondary | ICD-10-CM

## 2016-10-18 DIAGNOSIS — J45909 Unspecified asthma, uncomplicated: Secondary | ICD-10-CM | POA: Diagnosis present

## 2016-10-18 DIAGNOSIS — R4781 Slurred speech: Secondary | ICD-10-CM | POA: Diagnosis present

## 2016-10-18 DIAGNOSIS — Z91040 Latex allergy status: Secondary | ICD-10-CM

## 2016-10-18 DIAGNOSIS — Z7951 Long term (current) use of inhaled steroids: Secondary | ICD-10-CM

## 2016-10-18 DIAGNOSIS — Z888 Allergy status to other drugs, medicaments and biological substances status: Secondary | ICD-10-CM | POA: Diagnosis not present

## 2016-10-18 DIAGNOSIS — Z881 Allergy status to other antibiotic agents status: Secondary | ICD-10-CM | POA: Diagnosis not present

## 2016-10-18 DIAGNOSIS — Z8711 Personal history of peptic ulcer disease: Secondary | ICD-10-CM

## 2016-10-18 DIAGNOSIS — I480 Paroxysmal atrial fibrillation: Secondary | ICD-10-CM | POA: Diagnosis present

## 2016-10-18 DIAGNOSIS — Z8673 Personal history of transient ischemic attack (TIA), and cerebral infarction without residual deficits: Secondary | ICD-10-CM | POA: Diagnosis not present

## 2016-10-18 DIAGNOSIS — I2510 Atherosclerotic heart disease of native coronary artery without angina pectoris: Secondary | ICD-10-CM | POA: Diagnosis present

## 2016-10-18 DIAGNOSIS — I361 Nonrheumatic tricuspid (valve) insufficiency: Secondary | ICD-10-CM | POA: Diagnosis not present

## 2016-10-18 DIAGNOSIS — I6522 Occlusion and stenosis of left carotid artery: Secondary | ICD-10-CM

## 2016-10-18 DIAGNOSIS — N183 Chronic kidney disease, stage 3 unspecified: Secondary | ICD-10-CM | POA: Diagnosis present

## 2016-10-18 DIAGNOSIS — R079 Chest pain, unspecified: Secondary | ICD-10-CM

## 2016-10-18 LAB — CBC
HEMATOCRIT: 30.9 % — AB (ref 39.0–52.0)
HEMOGLOBIN: 10.4 g/dL — AB (ref 13.0–17.0)
MCH: 31.7 pg (ref 26.0–34.0)
MCHC: 33.7 g/dL (ref 30.0–36.0)
MCV: 94.2 fL (ref 78.0–100.0)
Platelets: 209 10*3/uL (ref 150–400)
RBC: 3.28 MIL/uL — ABNORMAL LOW (ref 4.22–5.81)
RDW: 15 % (ref 11.5–15.5)
WBC: 7.6 10*3/uL (ref 4.0–10.5)

## 2016-10-18 LAB — I-STAT CHEM 8, ED
BUN: 15 mg/dL (ref 6–20)
CHLORIDE: 96 mmol/L — AB (ref 101–111)
Calcium, Ion: 1.21 mmol/L (ref 1.15–1.40)
Creatinine, Ser: 1.6 mg/dL — ABNORMAL HIGH (ref 0.61–1.24)
Glucose, Bld: 120 mg/dL — ABNORMAL HIGH (ref 65–99)
HEMATOCRIT: 33 % — AB (ref 39.0–52.0)
Hemoglobin: 11.2 g/dL — ABNORMAL LOW (ref 13.0–17.0)
Potassium: 4.5 mmol/L (ref 3.5–5.1)
SODIUM: 133 mmol/L — AB (ref 135–145)
TCO2: 27 mmol/L (ref 22–32)

## 2016-10-18 LAB — COMPREHENSIVE METABOLIC PANEL
ALK PHOS: 80 U/L (ref 38–126)
ALT: 15 U/L — ABNORMAL LOW (ref 17–63)
AST: 26 U/L (ref 15–41)
Albumin: 3.4 g/dL — ABNORMAL LOW (ref 3.5–5.0)
Anion gap: 6 (ref 5–15)
BILIRUBIN TOTAL: 1.1 mg/dL (ref 0.3–1.2)
BUN: 13 mg/dL (ref 6–20)
CO2: 27 mmol/L (ref 22–32)
Calcium: 9.1 mg/dL (ref 8.9–10.3)
Chloride: 97 mmol/L — ABNORMAL LOW (ref 101–111)
Creatinine, Ser: 1.57 mg/dL — ABNORMAL HIGH (ref 0.61–1.24)
GFR calc Af Amer: 44 mL/min — ABNORMAL LOW (ref >60)
GFR calc non Af Amer: 38 mL/min — ABNORMAL LOW (ref >60)
Glucose, Bld: 126 mg/dL — ABNORMAL HIGH (ref 65–99)
POTASSIUM: 4.4 mmol/L (ref 3.5–5.1)
SODIUM: 130 mmol/L — AB (ref 135–145)
TOTAL PROTEIN: 8.5 g/dL — AB (ref 6.5–8.1)

## 2016-10-18 LAB — URINALYSIS, ROUTINE W REFLEX MICROSCOPIC
BACTERIA UA: NONE SEEN
BILIRUBIN URINE: NEGATIVE
Glucose, UA: NEGATIVE mg/dL
KETONES UR: NEGATIVE mg/dL
LEUKOCYTES UA: NEGATIVE
NITRITE: NEGATIVE
PH: 7 (ref 5.0–8.0)
PROTEIN: NEGATIVE mg/dL
SQUAMOUS EPITHELIAL / LPF: NONE SEEN
Specific Gravity, Urine: 1.021 (ref 1.005–1.030)

## 2016-10-18 LAB — DIFFERENTIAL
BASOS ABS: 0 10*3/uL (ref 0.0–0.1)
Basophils Relative: 0 %
EOS ABS: 0.3 10*3/uL (ref 0.0–0.7)
Eosinophils Relative: 4 %
LYMPHS ABS: 1.3 10*3/uL (ref 0.7–4.0)
LYMPHS PCT: 17 %
Monocytes Absolute: 0.5 10*3/uL (ref 0.1–1.0)
Monocytes Relative: 7 %
NEUTROS ABS: 5.5 10*3/uL (ref 1.7–7.7)
Neutrophils Relative %: 72 %

## 2016-10-18 LAB — APTT: APTT: 59 s — AB (ref 24–36)

## 2016-10-18 LAB — PROTIME-INR
INR: 1.29
Prothrombin Time: 16 seconds — ABNORMAL HIGH (ref 11.4–15.2)

## 2016-10-18 LAB — I-STAT TROPONIN, ED: Troponin i, poc: 0 ng/mL (ref 0.00–0.08)

## 2016-10-18 LAB — CBG MONITORING, ED: GLUCOSE-CAPILLARY: 113 mg/dL — AB (ref 65–99)

## 2016-10-18 MED ORDER — ACETAMINOPHEN 325 MG PO TABS: 650.0000 mg | ORAL_TABLET | ORAL | Status: DC | PRN

## 2016-10-18 MED ORDER — ACETAMINOPHEN 160 MG/5ML PO SOLN: 650.0000 mg | ORAL | Status: DC | PRN

## 2016-10-18 MED ORDER — SENNOSIDES-DOCUSATE SODIUM 8.6-50 MG PO TABS: 1.0000 | ORAL_TABLET | Freq: Every evening | ORAL | Status: DC | PRN

## 2016-10-18 MED ORDER — ATORVASTATIN CALCIUM 40 MG PO TABS
40.0000 mg | ORAL_TABLET | Freq: Every day | ORAL | Status: DC
  Administered 2016-10-19: 40 mg via ORAL
  Filled 2016-10-18: qty 1

## 2016-10-18 MED ORDER — ACETAMINOPHEN 650 MG RE SUPP
650.0000 mg | RECTAL | Status: DC | PRN
  Administered 2016-10-18 (×2): 650 mg via RECTAL
  Filled 2016-10-18 (×2): qty 1

## 2016-10-18 MED ORDER — HEPARIN SODIUM (PORCINE) 5000 UNIT/ML IJ SOLN
5000.0000 [IU] | Freq: Three times a day (TID) | INTRAMUSCULAR | Status: DC
  Administered 2016-10-18 – 2016-10-20 (×5): 5000 [IU] via SUBCUTANEOUS
  Filled 2016-10-18 (×7): qty 1

## 2016-10-18 MED ORDER — IOPAMIDOL (ISOVUE-370) INJECTION 76%
90.0000 mL | Freq: Once | INTRAVENOUS | Status: AC | PRN
  Administered 2016-10-18: 90 mL via INTRAVENOUS

## 2016-10-18 MED ORDER — PANTOPRAZOLE SODIUM 40 MG PO TBEC
80.0000 mg | DELAYED_RELEASE_TABLET | Freq: Every day | ORAL | Status: DC
Start: 2016-10-19 — End: 2016-10-20
  Administered 2016-10-19 – 2016-10-20 (×2): 80 mg via ORAL
  Filled 2016-10-18 (×2): qty 2

## 2016-10-18 MED ORDER — ASPIRIN EC 81 MG PO TBEC: 81.0000 mg | DELAYED_RELEASE_TABLET | Freq: Every day | ORAL | Status: DC

## 2016-10-18 MED ORDER — ENOXAPARIN SODIUM 60 MG/0.6ML ~~LOC~~ SOLN
60.0000 mg | SUBCUTANEOUS | Status: DC
  Filled 2016-10-18: qty 0.6

## 2016-10-18 MED ORDER — TAMSULOSIN HCL 0.4 MG PO CAPS
0.4000 mg | ORAL_CAPSULE | Freq: Every day | ORAL | Status: DC
  Administered 2016-10-19 – 2016-10-20 (×2): 0.4 mg via ORAL
  Filled 2016-10-18 (×2): qty 1

## 2016-10-18 MED ORDER — MECLIZINE HCL 12.5 MG PO TABS: 25.0000 mg | ORAL_TABLET | Freq: Three times a day (TID) | ORAL | Status: DC

## 2016-10-18 MED ORDER — CARVEDILOL 6.25 MG PO TABS
6.2500 mg | ORAL_TABLET | Freq: Two times a day (BID) | ORAL | Status: DC
  Administered 2016-10-19 – 2016-10-20 (×3): 6.25 mg via ORAL
  Filled 2016-10-18 (×3): qty 1

## 2016-10-18 MED ORDER — STROKE: EARLY STAGES OF RECOVERY BOOK
Freq: Once | Status: AC
  Administered 2016-10-18: 17:00:00
  Filled 2016-10-18: qty 1

## 2016-10-18 MED ORDER — ARFORMOTEROL TARTRATE 15 MCG/2ML IN NEBU
15.0000 ug | INHALATION_SOLUTION | Freq: Two times a day (BID) | RESPIRATORY_TRACT | Status: DC
  Administered 2016-10-18 – 2016-10-20 (×3): 15 ug via RESPIRATORY_TRACT
  Filled 2016-10-18 (×7): qty 2

## 2016-10-18 MED ORDER — MECLIZINE HCL 12.5 MG PO TABS: 25.0000 mg | ORAL_TABLET | Freq: Three times a day (TID) | ORAL | Status: DC | PRN

## 2016-10-18 MED ORDER — ALLOPURINOL 300 MG PO TABS
300.0000 mg | ORAL_TABLET | Freq: Every day | ORAL | Status: DC
  Administered 2016-10-20: 300 mg via ORAL
  Filled 2016-10-18 (×3): qty 1
  Filled 2016-10-18: qty 3

## 2016-10-18 MED ORDER — BUDESONIDE 0.5 MG/2ML IN SUSP
0.5000 mg | Freq: Two times a day (BID) | RESPIRATORY_TRACT | Status: DC
  Administered 2016-10-18 – 2016-10-20 (×4): 0.5 mg via RESPIRATORY_TRACT
  Filled 2016-10-18 (×6): qty 2

## 2016-10-18 MED ORDER — SODIUM CHLORIDE 0.9 % IV SOLN
INTRAVENOUS | Status: DC
  Administered 2016-10-18 – 2016-10-19 (×2): via INTRAVENOUS

## 2016-10-18 MED ORDER — IOPAMIDOL (ISOVUE-370) INJECTION 76%
INTRAVENOUS | Status: AC
  Filled 2016-10-18: qty 100

## 2016-10-18 NOTE — Consult Note (Addendum)
Neurology Consultation  Physician requesting consult-  Dr. Deretha Emory Reason for consult  CC: Confusion, generalized weakness, left gaze preference  History is obtained from: Family member who also acted as the interpreter  HPI: [name] is a 83 y.o. 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 This morning, the patient woke up and appeared confused. He was not able to recognize his family members. His speech also seemed slurred. The family was concerned for a stroke and brought the patient to the emergency room. A code stroke was activated upon arrival. Patient arrived by personal vehicle and 911 was not called. The family member, who is also interpreting, denies any preceding fevers or chills. Denies any upper respiratory illnesses. Denies any urinary frequency or dysuria. The patient is not complaining of any headache or blurred vision or tingling or numbness prior to this presentation. He is a known patient of [facility]  LKW: Family poor historian, 11 AM on 10/17/2016 tpa given?: no, on Pradaxa Premorbid modified Rankin scale (mRS):  2-Slight disability-UNABLE to perform all activities but does not need assistance   ROS: A 14 point ROS was performed and is negative except as noted in the HPI.   Past Medical History:  Diagnosis Date  . AAA (abdominal aortic aneurysm) (HCC)   . Anemia   . Asthma   . Carotid artery disease (HCC)   . CHF (congestive heart failure) (HCC)   . Coronary artery disease   . Depression   . Gout   . H. pylori infection   . Hard of hearing   . Hiatal  hernia   . Hyperplasia, prostate   . Hypertension   . PUD (peptic ulcer disease)   . Renal failure, acute (HCC) 11/08/2012    Family History  Problem Relation Age of Onset  . Colon cancer Neg Hx    Social History:   reports that he quit smoking about 14 years ago. He has never used smokeless tobacco. He reports that he does not drink alcohol or use drugs.  Medications No current facility-administered medications for this encounter.   Current Outpatient Prescriptions:  .  allopurinol (ZYLOPRIM) 300 MG tablet, Take 300 mg by mouth daily. To prevent gout, Disp: , Rfl:  .  arformoterol (BROVANA) 15 MCG/2ML NEBU, Take 2 mLs (15 mcg total) by nebulization 2 (two) times daily., Disp: 120 mL, Rfl: 2 .  aspirin EC 81 MG tablet, Take 1 tablet (81 mg total) by mouth daily., Disp: 90 tablet, Rfl: 3 .  atorvastatin (LIPITOR) 40 MG tablet, Take 1 tablet (40 mg total) by mouth daily at 6 PM., Disp: 30 tablet, Rfl: 5 .  budesonide (PULMICORT) 0.5 MG/2ML nebulizer solution, Take 2 mLs (0.5 mg total) by nebulization 2 (two) times daily., Disp: 2 mL, Rfl: 12 .  carvedilol (COREG) 6.25 MG tablet, Take 1 tablet (6.25 mg total) by mouth 2 (two) times daily with a meal., Disp: 60 tablet, Rfl: 6 .  dabigatran (PRADAXA) 75 MG CAPS capsule, Take 1 capsule (75 mg total) by mouth every 12 (twelve) hours., Disp: 60 capsule, Rfl: 0 .  esomeprazole (NEXIUM) 40 MG capsule,  Take 1 capsule (40 mg total) by mouth 2 (two) times daily before a meal., Disp: , Rfl:  .  isosorbide mononitrate (IMDUR) 30 MG 24 hr tablet, Take 30 mg by mouth once daily (Patient taking differently: Take 30 mg by mouth daily. For control of heart pain), Disp: 15 tablet, Rfl: 9 .  meclizine (ANTIVERT) 25 MG tablet, Take 25 mg by mouth every 8 (eight) hours. , Disp: , Rfl:  .  NITROSTAT 0.4 MG SL tablet, PLACE 1 TABLET UNDER THE TONGUE EVERY 5 MINUTES FOR 3 DOSES AS NEEDED FOR CHEST PAIN, Disp: 25 tablet, Rfl: 5 .  tamsulosin (FLOMAX) 0.4 MG CAPS  capsule, Take 0.4 mg by mouth daily. To improve bladder function, Disp: , Rfl:   Facility-Administered Medications Ordered in Other Encounters:  .  iopamidol (ISOVUE-370) 76 % injection, , , ,   Exam: Current vital signs: BP (!) 157/115   Pulse 80   Temp 97.6 F (36.4 C) (Oral)   Resp (!) 26   Ht 5' (1.524 m)   Wt 63.5 kg (140 lb)   SpO2 99%   BMI 27.34 kg/m  Vital signs in last 24 hours: Temp:  [97.6 F (36.4 C)] 97.6 F (36.4 C) (09/08 0750) Pulse Rate:  [80-81] 80 (09/08 0815) Resp:  [26-30] 26 (09/08 0815) BP: (157-192)/(106-115) 157/115 (09/08 0815) SpO2:  [99 %] 99 % (09/08 0815) Weight:  [63.5 kg (140 lb)] 63.5 kg (140 lb) (09/08 0751) General: Elderly man in no apparent distress. HEENT: Normocephalic, atraumatic, dry oral mucous membranes, clear nares, clear throat, CVS: S1-S2 heard, irregularly irregular Respiratory: Scattered rales Extremities: Warm well perfused Neurological exam Mental status examination done with the help of the interpreter. He is awake, alert, not oriented to person place and time. His speech is clear per the interpreter. Unable to name simple objects. Cranial nerves: Pupils are equal round reactive to light, his gaze is midline he's able to look to his left but he is unable to cross the midline to look to the right, he has a right homonymous hemianopsia, face is symmetric. Motor exam: Grossly symmetric antigravity strength with no drift in all 4 extremities. Sensory exam: Intact based on grimace and withdrawal to noxious stimulus but seems to be neglecting the right side in general. Coordination: Difficult to test but does not have gross dysmetria. Gait was not tested  NIHSS 1a Level of Conscious.: 0 1b LOC Questions: 2 1c LOC Commands: 0 2 Best Gaze: 1 3 Visual: 2 4 Facial Palsy: 0 5a Motor Arm - left: 0 5b Motor Arm - Right: 0 6a Motor Leg - Left: 0 6b Motor Leg - Right: 0 7 Limb Ataxia: 0 8 Sensory: 0 9 Best Language: 2 10  Dysarthria: 0 11 Extinct. and Inatten.: 1 TOTAL: 8  Labs I have reviewed labs in epic and the results pertinent to this consultation are:  CBC    Component Value Date/Time   WBC 7.6 10/18/2016 0800   RBC 3.28 (L) 10/18/2016 0800   HGB 11.2 (L) 10/18/2016 0810   HCT 33.0 (L) 10/18/2016 0810   PLT 209 10/18/2016 0800   MCV 94.2 10/18/2016 0800   MCH 31.7 10/18/2016 0800   MCHC 33.7 10/18/2016 0800   RDW 15.0 10/18/2016 0800   RDW 15.7 (H) 09/09/2013 0815   LYMPHSABS 1.3 10/18/2016 0800   LYMPHSABS 1.5 09/09/2013 0815   MONOABS 0.5 10/18/2016 0800   EOSABS 0.3 10/18/2016 0800   EOSABS 0.6 (H) 09/09/2013 0815   BASOSABS  0.0 10/18/2016 0800   BASOSABS 0.0 09/09/2013 0815    CMP     Component Value Date/Time   NA 133 (L) 10/18/2016 0810   NA 134 09/09/2013 0815   K 4.5 10/18/2016 0810   CL 96 (L) 10/18/2016 0810   CO2 27 10/18/2016 0800   GLUCOSE 120 (H) 10/18/2016 0810   BUN 15 10/18/2016 0810   BUN 21 09/09/2013 0815   CREATININE 1.60 (H) 10/18/2016 0810   CREATININE 1.47 (H) 02/13/2016 1159   CALCIUM 9.1 10/18/2016 0800   PROT 8.5 (H) 10/18/2016 0800   ALBUMIN 3.4 (L) 10/18/2016 0800   AST 26 10/18/2016 0800   ALT 15 (L) 10/18/2016 0800   ALKPHOS 80 10/18/2016 0800   BILITOT 1.1 10/18/2016 0800   GFRNONAA 38 (L) 10/18/2016 0800   GFRAA 44 (L) 10/18/2016 0800    Lipid Panel     Component Value Date/Time   CHOL 100 07/18/2016 0757   CHOL 122 09/09/2013 0815   TRIG 138 07/18/2016 0757   HDL 30 (L) 07/18/2016 0757   HDL 35 (L) 09/09/2013 0815   CHOLHDL 3.3 07/18/2016 0757   VLDL 28 07/18/2016 0757   LDLCALC 42 07/18/2016 0757   LDLCALC 50 09/09/2013 0815     Imaging I have reviewed the images obtained:  CT-scan of the brain Shows evidence of recent posterior limb of internal capsule infarct on the right, extensive atrophy. No acute changes  CTA and perfusion reviewed. There is no large vessel occlusion. He has a right M1 stenosis. On the rapid  perfusion sequences, there is a area of infarct in the left occipital lobe measuring about 8 mL. There is extensive intracranial atherosclerosis. Echocardiogram from June the thousand 18 shows moderate left atrial medication.  Assessment:  83 year old man with a past medical history of A. fib on Pradaxa, coronary artery disease, right internal capsular stroke in late August 2018 and history of prior strokes who presumably had no residual motor or sensory deficits and per family not much of cognitive deficits was brought in for evaluation of generalized weakness, confusion and left gaze preference. On my examination, he did have a leftward gaze preference and was unable to cross the midline to look to the right. He did not have any motor weakness. His mental status exam was very poorly was only alert and oriented to self. Based on his exam finding of the gaze preference, code stroke was activated. I did obtain a CT angiogram of the head and neck/CT perfusion study as he is beyond the standard window for endovascular therapy and could have been a candidate for the extended window endovascular therapy. The CT perfusion images in the CT angiogram of the head and neck were not suggestive of any acutte large vessel occlusion (hasLICA stent which is stenoses), but did look suggestive of a small left occipital area of infarct. This needs to definitely be confirmed with an MRI. Another possibility is recrudescence of his old symptoms in the setting of an infection like a UTI. He could also have had a seizure.  Both him and his family member who is the interpreter, are poor historians. This gentleman is not a candidate for IV TPA for various reasons-outside the window/on anti-coagulation/recent stroke less than 2 weeks ago. He is not a candidate for endovascular therapy because of the absence of a large vessel occlusion.   Impression: Evaluate for new stroke Evaluate for seizure Possible stroke  recrudescence Evaluate for UTIs/pneumonias Acute toxic metabolic encephalopathy. Afib  Recommendations:  At this time, I would like to obtain a stat MRI of the brain to evaluate for stroke. If the MRI comes negative, I would like to obtain an EEG. I would also like to obtain a urinalysis and chest x-ray. If the MRI is positive for stroke, he needs to be admitted and his anti-coagulation status needs to be discussed further, whether he needs a different anticoagulant. If his MRI is negative for stroke, then the workup would be more for the acute encephalopathy versus seizure. We will follow with you.  Milon Dikes, MD [facility] [phone]  If 7pm to 7am, please call on call as listed on AMION.   Addendum after MRI 0956 hrs DWI sequences reviewed. Full MRI pending. Patient does have a left occipital area of restricted diffusion that is acute. This is probably a new stroke. Likely from his atrial fibrillation. Would recommend admission for evaluation by the stroke team, PT, OT, speech therapy. I will still pursue the toxic metabolic workup. Additionally, I would recommend the following: -Hemoglobin A1c -Lipid panel -2-D echocardiogram Seizures is now more on the differentials.  D/W Dr Roda Shutters on stroke service. --For now ASA 325 only as the stroke is new --Hold full dose anticoagulation (ok to do DVT ppx) --May need to get diagnostic cerebral angio for the LICA stenosis. Stroke team to follow with further recs on long term antiplatelet/anticoagulation.  Milon Dikes, MD [facility] [phone]  If 7pm to 7am, please call on call as listed on AMION.

## 2016-10-18 NOTE — Code Documentation (Signed)
83 y.o. non-English speaking Male with PMHx of a-fibb on Pradaxa, HTN, CAD, CHF and CVA who presented to the ED via POV this morning for an acute onset of confusion, generalized weakness and leftward gaze. Upon arrival, Code stroke was activated. Patient was met by the stroke team in CT. Upon exam, patient is very [facility] and is having difficulty with the exam even with use of interpreter. Patient not blinking to threat on the right, leftward gaze, mild aphasia and mild dysarthria. Per triage RN, patient came in with drift in his RUE and RLE with no effort against gravity. At the time of the stroke team eval, the RUE and RLE had no deficit.    NIHSS 6. See EMR for NIHSS and code stroke times. VAN (+). CT revealed no acute abnormalities. ASPECTS 10. CTA with no LVO. CTP suggests small area of infarct in the left occipital lobe. MRI revealed interval development of acute infarct in the left occipital parietal cortex. Watershed areas of acute infarct in the left frontal and parietal lobe. Acute infarct left superior thalamus. IV tPA not given d/t being on Pradaxa. Not a thrombectomy candidate d/t no LVO. ED bedside handoff with ED RN Margreta Journey.

## 2016-10-18 NOTE — H&P (Addendum)
History and Physical  Jahron Hunsinger ZOX:[id number] DOB: 09/20/1926 DOA: 10/18/2016  Referring physician: Dr Deretha Emory, ED physician PCP: [name], MD  Outpatient Specialists:   Dr Allyson Sabal (Cardiology)  [facility]  Patient Coming From: home  Chief Complaint: weakness, left gaze preference.  HPI: Quillan Whitter is a 83 y.o. male with a history of atrial fibrillation with chads 2 vascular score of 6, CAD, history of multiple strokes on anticoagulation, with his most recent stroke at the end of August 2018 while on Pradaxa. History is obtained by family members as patient is poor historian. Patient was last seen normal yesterday morning. The patient became gradually more weak and was unable to the pertussis. And his physical therapy. This morning, the patient woke up and appeared more confused, unable to recognize family members, slurred speech, weakness on his right side, preference of leftward gaze. Patient was brought to the emergency department by private vehicle. He was not considered a TPA candidate due to being on Pradaxa. Since being in the emergency department, his symptoms have been improving.  The patient was recently on Xarelto for anticoagulation until 07/2016, when he was switched to Pradaxa due to treatment failure. He has never been on Coumadin or Lovenox/heparin  Emergency Department Course: Neurology consulted. CT shows left occipital stroke. MRI confirms infarct in the left occipital parietal cortex with watershed areas of the left frontal and parietal lobes. He also has a acute infarct of the left superior thalamus.  Review of Systems:   Pt complains of headache, dry throat.  Pt denies any fevers, chills, nausea, vomiting, diarrhea, constipation, abdominal pain, shortness of breath, dyspnea on exertion, orthopnea, cough, wheezing, palpitations, headache, vision changes, lightheadedness, dizziness, melena, rectal bleeding.  Review of systems are otherwise  negative  Past Medical History:  Diagnosis Date  . AAA (abdominal aortic aneurysm) (HCC)   . Anemia   . Asthma   . Carotid artery disease (HCC)   . CHF (congestive heart failure) (HCC)   . Coronary artery disease   . Depression   . Gout   . H. pylori infection   . Hard of hearing   . Hiatal hernia   . Hyperplasia, prostate   . Hypertension   . PUD (peptic ulcer disease)   . Renal failure, acute (HCC) 11/08/2012   Past Surgical History:  Procedure Laterality Date  . CAROTID STENT INSERTION  2015  . CAROTID STENT INSERTION N/A 05/09/2013   Procedure: CAROTID STENT INSERTION;  Surgeon: Runell Gess, MD;  Location: [facility] CATH LAB;  Service: Cardiovascular;  Laterality: N/A;  . ESOPHAGOGASTRODUODENOSCOPY  06/13/2008,12/31/12  . LEFT HEART CATHETERIZATION WITH CORONARY ANGIOGRAM N/A 05/04/2013   Procedure: LEFT HEART CATHETERIZATION WITH CORONARY ANGIOGRAM;  Surgeon: Peter M Swaziland, MD;  Location: [facility] CATH LAB;  Service: Cardiovascular;  Laterality: N/A;   Social History:  reports that he quit smoking about 14 years ago. He has never used smokeless tobacco. He reports that he does not drink alcohol or use drugs. Patient lives at Home  Allergies  Allergen Reactions  . Levofloxacin Anaphylaxis and Other (See Comments)    Patient told his daughter that it made him "feel worse than he already did" (overall) Headache also (has refused to take anymore)  . Eliquis [Apixaban] Itching and Swelling  . Phenazopyridine Hcl Other (See Comments)    Unknown reaction  . Septra [Sulfamethoxazole-Trimethoprim] Nausea And Vomiting    GI Upset  . Latex Rash  . Tape Rash    Family History  Problem  Relation Age of Onset  . Colon cancer Neg Hx       Prior to Admission medications   Medication Sig Start Date End Date Taking? Authorizing Provider  aspirin EC 81 MG tablet Take 1 tablet (81 mg total) by mouth daily. 09/08/13  Yes Marvel Plan, MD  meclizine (ANTIVERT) 25 MG tablet Take 25 mg by mouth  every 8 (eight) hours.  09/05/16  Yes [provider]  NITROSTAT 0.4 MG SL tablet PLACE 1 TABLET UNDER THE TONGUE EVERY 5 MINUTES FOR 3 DOSES AS NEEDED FOR CHEST PAIN Patient taking differently: PLACE 0.4 MG UNDER THE TONGUE EVERY 5 MINUTES FOR 3 DOSES AS NEEDED FOR CHEST PAIN 10/29/15  Yes Runell Gess, MD  allopurinol (ZYLOPRIM) 300 MG tablet Take 300 mg by mouth daily. To prevent gout    [provider]  arformoterol (BROVANA) 15 MCG/2ML NEBU Take 2 mLs (15 mcg total) by nebulization 2 (two) times daily. 10/10/15   Rhetta Mura, MD  atorvastatin (LIPITOR) 40 MG tablet Take 1 tablet (40 mg total) by mouth daily at 6 PM. 05/11/13   Robbie Lis M, PA-C  budesonide (PULMICORT) 0.5 MG/2ML nebulizer solution Take 2 mLs (0.5 mg total) by nebulization 2 (two) times daily. 10/10/15   Rhetta Mura, MD  carvedilol (COREG) 6.25 MG tablet Take 1 tablet (6.25 mg total) by mouth 2 (two) times daily with a meal. 07/23/16   Runell Gess, MD  dabigatran (PRADAXA) 75 MG CAPS capsule Take 1 capsule (75 mg total) by mouth every 12 (twelve) hours. 07/21/16 10/09/16  Cleora Fleet, MD  esomeprazole (NEXIUM) 40 MG capsule Take 1 capsule (40 mg total) by mouth 2 (two) times daily before a meal. 07/21/16   Johnson, Clanford L, MD  isosorbide mononitrate (IMDUR) 30 MG 24 hr tablet Take 30 mg by mouth once daily Patient taking differently: Take 30 mg by mouth daily. For control of heart pain 07/31/15   Margarita Grizzle, MD  tamsulosin (FLOMAX) 0.4 MG CAPS capsule Take 0.4 mg by mouth daily. To improve bladder function    [provider]    Physical Exam: BP (!) 175/80   Pulse 81   Temp 97.6 F (36.4 C) (Oral)   Resp (!) 28   Ht 5' (1.524 m)   Wt 63.5 kg (140 lb)   SpO2 97%   BMI 27.34 kg/m   General: Elderly [place] Asian man. Awake and alert and oriented x3. No acute cardiopulmonary distress.  HEENT: Normocephalic atraumatic.  Right and left ears normal in  appearance.  Pupils equal, round, reactive to light. Extraocular muscles are intact, although unable to cross midline to look right. Right homonymous hemianopsia. Sclerae anicteric and noninjected.  Dry mucosal membranes. No mucosal lesions.  Neck: Neck supple without lymphadenopathy. No carotid bruits. No masses palpated.  Cardiovascular: Regular rate with normal S1-S2 sounds. No murmurs, rubs, gallops auscultated. No JVD.  Respiratory: Good respiratory effort with no wheezes, rales, rhonchi. Lungs clear to auscultation bilaterally.  No accessory muscle use. Abdomen: Soft, nontender, nondistended. Active bowel sounds. No masses or hepatosplenomegaly  Skin: No rashes, lesions, or ulcerations.  Dry, warm to touch. 2+ dorsalis pedis and radial pulses. Musculoskeletal: No calf or leg pain. All major joints not erythematous nontender.  No upper or lower joint deformation.  Good ROM.  No contractures  Psychiatric: Pleasant and cooperative. Neurologic: Difficult to assess due to patient's compliance. Has some neglect of the right side, although this appears to be improving. Has antigravity  strength in all 4 extremities, but not compliant with specific muscle testing.            Labs on Admission: I have personally reviewed following labs and imaging studies  CBC:  Recent Labs Lab 10/18/16 0800 10/18/16 0810  WBC 7.6  --   NEUTROABS 5.5  --   HGB 10.4* 11.2*  HCT 30.9* 33.0*  MCV 94.2  --   PLT 209  --    Basic Metabolic Panel:  Recent Labs Lab 10/18/16 0800 10/18/16 0810  NA 130* 133*  K 4.4 4.5  CL 97* 96*  CO2 27  --   GLUCOSE 126* 120*  BUN 13 15  CREATININE 1.57* 1.60*  CALCIUM 9.1  --    GFR: Estimated Creatinine Clearance: 25 mL/min (A) (by C-G formula based on SCr of 1.6 mg/dL (H)). Liver Function Tests:  Recent Labs Lab 10/18/16 0800  AST 26  ALT 15*  ALKPHOS 80  BILITOT 1.1  PROT 8.5*  ALBUMIN 3.4*   No results for input(s): LIPASE, AMYLASE in the last 168  hours. No results for input(s): AMMONIA in the last 168 hours. Coagulation Profile:  Recent Labs Lab 10/18/16 0800  INR 1.29   Cardiac Enzymes: No results for input(s): CKTOTAL, CKMB, CKMBINDEX, TROPONINI in the last 168 hours. BNP (last 3 results) No results for input(s): PROBNP in the last 8760 hours. HbA1C: No results for input(s): HGBA1C in the last 72 hours. CBG:  Recent Labs Lab 10/18/16 0753  GLUCAP 113*   Lipid Profile: No results for input(s): CHOL, HDL, LDLCALC, TRIG, CHOLHDL, LDLDIRECT in the last 72 hours. Thyroid Function Tests: No results for input(s): TSH, T4TOTAL, FREET4, T3FREE, THYROIDAB in the last 72 hours. Anemia Panel: No results for input(s): VITAMINB12, FOLATE, FERRITIN, TIBC, IRON, RETICCTPCT in the last 72 hours. Urine analysis:    Component Value Date/Time   COLORURINE STRAW (A) 10/18/2016 0949   APPEARANCEUR CLEAR 10/18/2016 0949   LABSPEC 1.021 10/18/2016 0949   PHURINE 7.0 10/18/2016 0949   GLUCOSEU NEGATIVE 10/18/2016 0949   HGBUR SMALL (A) 10/18/2016 0949   BILIRUBINUR NEGATIVE 10/18/2016 0949   KETONESUR NEGATIVE 10/18/2016 0949   PROTEINUR NEGATIVE 10/18/2016 0949   UROBILINOGEN 0.2 07/07/2014 0604   NITRITE NEGATIVE 10/18/2016 0949   LEUKOCYTESUR NEGATIVE 10/18/2016 0949   Sepsis Labs: (procalcitonin:4,lacticidven:4) )No results found for this or any previous visit (from the past 240 hour(s)).   Radiological Exams on Admission: Ct Angio Head W Or Wo Contrast  Result Date: 10/18/2016 CLINICAL DATA:  Code stroke.  Recent stroke with neuro changes. EXAM: CT ANGIOGRAPHY HEAD AND NECK CT PERFUSION BRAIN TECHNIQUE: Multidetector CT imaging of the head and neck was performed using the standard protocol during bolus administration of intravenous contrast. Multiplanar CT image reconstructions and MIPs were obtained to evaluate the vascular anatomy. Carotid stenosis measurements (when applicable) are obtained utilizing NASCET  criteria, using the distal internal carotid diameter as the denominator. Multiphase CT imaging of the brain was performed following IV bolus contrast injection. Subsequent parametric perfusion maps were calculated using RAPID software. CONTRAST:  100 mL Isovue 370 IV COMPARISON:  CT head 10/18/2016, MRI 10/09/2016 FINDINGS: CTA NECK FINDINGS Aortic arch: Advanced atherosclerotic disease in the aortic arch. Proximal great vessels patent with mild atherosclerotic disease Right carotid system: Mild atherosclerotic disease of the right carotid bifurcation without significant stenosis Left carotid system: Stent across the left carotid bifurcation extending into the left internal carotid artery. Stent is widely patent. Moderate stenosis origin of the  external carotid artery. Vertebral arteries: Both vertebral arteries patent to the basilar without significant stenosis. Mild atherosclerotic disease distal left vertebral artery. Skeleton: Cervical degenerative change. No acute skeletal abnormality. Other neck: Negative Upper chest: Negative Review of the MIP images confirms the above findings CTA HEAD FINDINGS Anterior circulation: Atherosclerotic calcification right cavernous carotid without significant stenosis. Moderate stenosis right M1/M2. Right anterior cerebral artery patent. Atherosclerotic calcification left cavernous carotid without significant stenosis. Left anterior and middle cerebral artery patent without significant stenosis Posterior circulation: Both vertebral arteries contribute to the basilar. Mild stenosis distal left vertebral artery. Left vertebral artery dominant. Right PICA patent. Left PICA appears supplied from the left AICA. Basilar widely patent. Fetal origin of the posterior cerebral artery on the left. Both posterior cerebral arteries widely patent without stenosis. Venous sinuses: Patent Anatomic variants: None Delayed phase: Not performed Review of the MIP images confirms the above findings  CT Brain Perfusion Findings: CBF (<30%) Volume: 8 mLmL in the left occipital lobe Perfusion (Tmax>6.0s) volume: 97 mLmL. Delayed perfusion is present in the left occipital lobe as well as in the left watershed territory. Delayed perfusion also noted in the right temporal and occipital lobe. Mismatch Volume: 32mL Infarction Location:Left occipital lobe small infarct. Bilateral delayed perfusion IMPRESSION: Carotid and vertebral arteries are patent in the neck with mild atherosclerotic disease. Left carotid stent widely patent. No emergent large vessel occlusion CT perfusion suggests small area of infarct in the left occipital lobe. Bilateral delayed perfusion may be related to acute ischemia or severe intracranial small vessel atherosclerotic disease. These results were called by telephone at the time of interpretation on 10/18/2016 at 9:15 am to Dr. Wilford Corner, who verbally acknowledged these results. Electronically Signed   By: Marlan Palau M.D.   On: 10/18/2016 09:16   Ct Angio Neck W Or Wo Contrast  Result Date: 10/18/2016 CLINICAL DATA:  Code stroke.  Recent stroke with neuro changes. EXAM: CT ANGIOGRAPHY HEAD AND NECK CT PERFUSION BRAIN TECHNIQUE: Multidetector CT imaging of the head and neck was performed using the standard protocol during bolus administration of intravenous contrast. Multiplanar CT image reconstructions and MIPs were obtained to evaluate the vascular anatomy. Carotid stenosis measurements (when applicable) are obtained utilizing NASCET criteria, using the distal internal carotid diameter as the denominator. Multiphase CT imaging of the brain was performed following IV bolus contrast injection. Subsequent parametric perfusion maps were calculated using RAPID software. CONTRAST:  100 mL Isovue 370 IV COMPARISON:  CT head 10/18/2016, MRI 10/09/2016 FINDINGS: CTA NECK FINDINGS Aortic arch: Advanced atherosclerotic disease in the aortic arch. Proximal great vessels patent with mild atherosclerotic  disease Right carotid system: Mild atherosclerotic disease of the right carotid bifurcation without significant stenosis Left carotid system: Stent across the left carotid bifurcation extending into the left internal carotid artery. Stent is widely patent. Moderate stenosis origin of the external carotid artery. Vertebral arteries: Both vertebral arteries patent to the basilar without significant stenosis. Mild atherosclerotic disease distal left vertebral artery. Skeleton: Cervical degenerative change. No acute skeletal abnormality. Other neck: Negative Upper chest: Negative Review of the MIP images confirms the above findings CTA HEAD FINDINGS Anterior circulation: Atherosclerotic calcification right cavernous carotid without significant stenosis. Moderate stenosis right M1/M2. Right anterior cerebral artery patent. Atherosclerotic calcification left cavernous carotid without significant stenosis. Left anterior and middle cerebral artery patent without significant stenosis Posterior circulation: Both vertebral arteries contribute to the basilar. Mild stenosis distal left vertebral artery. Left vertebral artery dominant. Right PICA patent. Left PICA appears supplied from  the left AICA. Basilar widely patent. Fetal origin of the posterior cerebral artery on the left. Both posterior cerebral arteries widely patent without stenosis. Venous sinuses: Patent Anatomic variants: None Delayed phase: Not performed Review of the MIP images confirms the above findings CT Brain Perfusion Findings: CBF (<30%) Volume: 8 mLmL in the left occipital lobe Perfusion (Tmax>6.0s) volume: 97 mLmL. Delayed perfusion is present in the left occipital lobe as well as in the left watershed territory. Delayed perfusion also noted in the right temporal and occipital lobe. Mismatch Volume: 89mL Infarction Location:Left occipital lobe small infarct. Bilateral delayed perfusion IMPRESSION: Carotid and vertebral arteries are patent in the neck with  mild atherosclerotic disease. Left carotid stent widely patent. No emergent large vessel occlusion CT perfusion suggests small area of infarct in the left occipital lobe. Bilateral delayed perfusion may be related to acute ischemia or severe intracranial small vessel atherosclerotic disease. These results were called by telephone at the time of interpretation on 10/18/2016 at 9:15 am to Dr. Wilford Corner, who verbally acknowledged these results. Electronically Signed   By: Marlan Palau M.D.   On: 10/18/2016 09:16   Mr Brain Wo Contrast  Result Date: 10/18/2016 CLINICAL DATA:  Stroke EXAM: MRI HEAD WITHOUT CONTRAST TECHNIQUE: Multiplanar, multiecho pulse sequences of the brain and surrounding structures were obtained without intravenous contrast. COMPARISON:  CT and CT perfusion 10/18/2016 FINDINGS: Brain: Image quality degraded by motion Acute infarct in the left occipital parietal cortex corresponding to the CT perfusion area. Additional small areas of acute infarct in the left superior thalamus, left medial parietal lobe, and left frontal lobe, and deep white matter on the left. Distribution is suggestive of watershed ischemia. Small area of acute infarct in the posterior limb internal capsule is subacute and was present on MRI of 10/09/2016 Chronic microhemorrhage right thalamus unchanged from the prior MRI. Generalized atrophy. Chronic infarct right frontal lobe and throughout the cerebral white matter bilaterally. Bifrontal subdural hygroma unchanged. No midline shift. Vascular: Normal arterial flow voids Skull and upper cervical spine: Negative Sinuses/Orbits: Negative Other: None IMPRESSION: Interval development of acute infarct in the left occipital parietal cortex. Watershed areas of acute infarct in the left frontal and parietal lobe. Acute infarct left superior thalamus. Subacute infarct posterior limb internal capsule on the right unchanged from recent MRI Atrophy and chronic ischemic changes. Electronically  Signed   By: Marlan Palau M.D.   On: 10/18/2016 10:27   Ct Cerebral Perfusion W Contrast  Result Date: 10/18/2016 CLINICAL DATA:  Code stroke.  Recent stroke with neuro changes. EXAM: CT ANGIOGRAPHY HEAD AND NECK CT PERFUSION BRAIN TECHNIQUE: Multidetector CT imaging of the head and neck was performed using the standard protocol during bolus administration of intravenous contrast. Multiplanar CT image reconstructions and MIPs were obtained to evaluate the vascular anatomy. Carotid stenosis measurements (when applicable) are obtained utilizing NASCET criteria, using the distal internal carotid diameter as the denominator. Multiphase CT imaging of the brain was performed following IV bolus contrast injection. Subsequent parametric perfusion maps were calculated using RAPID software. CONTRAST:  100 mL Isovue 370 IV COMPARISON:  CT head 10/18/2016, MRI 10/09/2016 FINDINGS: CTA NECK FINDINGS Aortic arch: Advanced atherosclerotic disease in the aortic arch. Proximal great vessels patent with mild atherosclerotic disease Right carotid system: Mild atherosclerotic disease of the right carotid bifurcation without significant stenosis Left carotid system: Stent across the left carotid bifurcation extending into the left internal carotid artery. Stent is widely patent. Moderate stenosis origin of the external carotid artery. Vertebral arteries:  Both vertebral arteries patent to the basilar without significant stenosis. Mild atherosclerotic disease distal left vertebral artery. Skeleton: Cervical degenerative change. No acute skeletal abnormality. Other neck: Negative Upper chest: Negative Review of the MIP images confirms the above findings CTA HEAD FINDINGS Anterior circulation: Atherosclerotic calcification right cavernous carotid without significant stenosis. Moderate stenosis right M1/M2. Right anterior cerebral artery patent. Atherosclerotic calcification left cavernous carotid without significant stenosis. Left  anterior and middle cerebral artery patent without significant stenosis Posterior circulation: Both vertebral arteries contribute to the basilar. Mild stenosis distal left vertebral artery. Left vertebral artery dominant. Right PICA patent. Left PICA appears supplied from the left AICA. Basilar widely patent. Fetal origin of the posterior cerebral artery on the left. Both posterior cerebral arteries widely patent without stenosis. Venous sinuses: Patent Anatomic variants: None Delayed phase: Not performed Review of the MIP images confirms the above findings CT Brain Perfusion Findings: CBF (<30%) Volume: 8 mLmL in the left occipital lobe Perfusion (Tmax>6.0s) volume: 97 mLmL. Delayed perfusion is present in the left occipital lobe as well as in the left watershed territory. Delayed perfusion also noted in the right temporal and occipital lobe. Mismatch Volume: 89mL Infarction Location:Left occipital lobe small infarct. Bilateral delayed perfusion IMPRESSION: Carotid and vertebral arteries are patent in the neck with mild atherosclerotic disease. Left carotid stent widely patent. No emergent large vessel occlusion CT perfusion suggests small area of infarct in the left occipital lobe. Bilateral delayed perfusion may be related to acute ischemia or severe intracranial small vessel atherosclerotic disease. These results were called by telephone at the time of interpretation on 10/18/2016 at 9:15 am to Dr. Wilford Corner, who verbally acknowledged these results. Electronically Signed   By: Marlan Palau M.D.   On: 10/18/2016 09:16   Ct Head Code Stroke Wo Contrast`  Result Date: 10/18/2016 CLINICAL DATA:  Code stroke. Subacute neuro deficits. History of recent stroke EXAM: CT HEAD WITHOUT CONTRAST TECHNIQUE: Contiguous axial images were obtained from the base of the skull through the vertex without intravenous contrast. COMPARISON:  CT 10/08/2016, MRI 10/09/2016 FINDINGS: Brain: Recent infarct in the posterior limb internal  capsule on the right. No progression of this area. Negative for acute hemorrhage. Negative for acute infarct or mass Generalized moderate atrophy. Negative for hydrocephalus. Extensive chronic ischemic change throughout the white matter. Chronic infarcts in the right frontal lobe, and left frontal convexity. Small chronic infarct left occipital lobe. Chronic infarct left thalamus. Vascular: Negative for hyperdense vessel. Extensive atherosclerotic calcification Skull: Chronic fracture right zygomatic arch Sinuses/Orbits: Right mastoid sinus effusion. Paranasal sinuses clear. Other: None ASPECTS ([place] Stroke Program Early CT Score) - Ganglionic level infarction (caudate, lentiform nuclei, internal capsule, insula, M1-M3 cortex): 7 - Supraganglionic infarction (M4-M6 cortex): 3 Total score (0-10 with 10 being normal): 10 IMPRESSION: 1. No acute abnormality. Recent infarct posterior limb internal capsule 2. ASPECTS is 10 3. Extensive chronic ischemic change These results were called by telephone at the time of interpretation on 10/18/2016 at 8:38 am to Dr. Wilford Corner, who verbally acknowledged these results. Electronically Signed   By: Marlan Palau M.D.   On: 10/18/2016 08:39    EKG: Independently reviewed. Atrial fibrillation. No acute ST changes.  Assessment/Plan: Principal Problem:   Acute ischemic stroke [facility]) Active Problems:   Hypertension   History of CVA (cerebrovascular accident)   Chronic systolic heart failure (HCC)   Paroxysmal atrial fibrillation (HCC)   Chronic anticoagulation   CKD (chronic kidney disease), stage III   Non-English speaking patient  This patient was discussed with the ED physician, including pertinent vitals, physical exam findings, labs, and imaging.  We also discussed care given by the ED provider.  #1 acute ischemic stroke  Admit on telemetry  Failure on Pradaxa  Echocardiogram tomorrow  Hemoglobin A1c, lipid panel in the morning  PT/OT/speech therapy  consult  We'll hold antihypertensives and allow for permissive hypertension. If the patient's heart rate starts to increase, will provide patient with beta blocker  Certainly the main issue will be whether the patient needs different anticoagulation due to treatment failure. With his chronic kidney disease, his creatinine clearance is borderline for dose adjustment on his Pradaxa - his creatinine clearance is currently 25 ml/min, therefore the patient is appropriately on 75 mg twice a day, which is the lower dose. Placing the patient on a higher dose of Pradaxa certainly increases his bleeding risk, especially given his GFR. The patient could be switched to yet another Xa inhibitor or could be switched to conventional Coumadin or even Lovenox. Another alternative could be to add an antiplatelet.  I briefly had a conversation with the patient's family regarding this dilemma.  As patient has not passed a swallow screen, will place the patient on Lovenox for now. #2 history of CVA  #3 hypertension  Hold antihypertensives for now #4 chronic systolic heart failure  Compensated #5 paroxysmal fibrillation  Rate controlled #6 chronic anticoagulation  We'll need to continue #7 chronic kidney disease stage III  At baseline at 1.57 #8 non-English speaking patient  Patient's family declined interpreter  DVT prophylaxis: Lovenox Consultants: Appreciate neurology's input Code Status: Full code Family Communication: Family at bedside  Disposition Plan: Pending evaluation   Levie Heritage DO [facility] Pager [phone]  If 7PM-7AM, please contact night-coverage [email or web address] Password [email or web address]

## 2016-10-18 NOTE — ED Notes (Signed)
Pt back from MRI 

## 2016-10-18 NOTE — Progress Notes (Signed)
ANTICOAGULATION CONSULT NOTE - Initial Consult  Pharmacy Consult:  Lovenox Indication: atrial fibrillation and secondary stroke prevention  Allergies  Allergen Reactions  . Levofloxacin Anaphylaxis and Other (See Comments)    Patient told his daughter that it made him "feel worse than he already did" (overall) Headache also (has refused to take anymore)  . Eliquis [Apixaban] Itching and Swelling  . Phenazopyridine Hcl Other (See Comments)    Unknown reaction  . Septra [Sulfamethoxazole-Trimethoprim] Nausea And Vomiting and Other (See Comments)    GI Upset  . Latex Rash  . Tape Rash    Patient Measurements: Height: 5' (152.4 cm) Weight: 140 lb (63.5 kg) IBW/kg (Calculated) : 50  Vital Signs: Temp: 97.6 F (36.4 C) (09/08 0750) Temp Source: Oral (09/08 0750) BP: 160/87 (09/08 1200) Pulse Rate: 76 (09/08 1200)  Labs:  Recent Labs  10/18/16 0800 10/18/16 0810  HGB 10.4* 11.2*  HCT 30.9* 33.0*  PLT 209  --   APTT 59*  --   LABPROT 16.0*  --   INR 1.29  --   CREATININE 1.57* 1.60*    Estimated Creatinine Clearance: 25 mL/min (A) (by C-G formula based on SCr of 1.6 mg/dL (H)).   Medical History: Past Medical History:  Diagnosis Date  . AAA (abdominal aortic aneurysm) (HCC)   . Anemia   . Asthma   . Carotid artery disease (HCC)   . CHF (congestive heart failure) (HCC)   . Coronary artery disease   . Depression   . Gout   . H. pylori infection   . Hard of hearing   . Hiatal hernia   . Hyperplasia, prostate   . Hypertension   . PUD (peptic ulcer disease)   . Renal failure, acute (HCC) 11/08/2012     Assessment: 73 YOM with history of Afib and multiple CVAs on Pradaxa PTA.  Patient presented with weakness and was also a code stroke.  CT/MRI showed new CVA with no hemorrhage.  Pharmacy consulted to initiate Lovenox.  Noted that he also has a history of Xarelto failure.  Patient has CKD and his CrCL is 25 ml/min.  His last Pradaxa dose was  yesterday.   Goal of Therapy:  Anti-Xa level 0.6-1 units/ml 4hrs after LMWH dose given Monitor platelets by anticoagulation protocol: Yes    Plan:  Lovenox 60mg  SQ Q24H Monitor renal fxn, CBC Q72H while on Lovenox May need to check anti-Xa level if on Lovenox long-term F/U transitioning to another oral AC   Breionna Punt D. Laney Potash, PharmD, BCPS Pager:  [phone] 10/18/2016, 12:21 PM

## 2016-10-18 NOTE — ED Notes (Signed)
Dr. Stinson at bedside.  

## 2016-10-18 NOTE — ED Notes (Signed)
Dr. Ruthy Dick called code stroke LVO+

## 2016-10-18 NOTE — ED Provider Notes (Signed)
[facility]-EMERGENCY DEPT Provider Note   CSN: [id number] Arrival date & time: 10/18/16  [id number]   An emergency department physician performed an initial assessment on this suspected stroke patient at [phone].  History   Chief Complaint Chief Complaint  Patient presents with  . Weakness    HPI Fernado Brigante is a 83 y.o. male.  Patient brought in by family members. Concern is for right-sided weakness that was noted this morning. Patient last seen to be normal 11:00 yesterday morning. Patient with admission the end of August off for cerebral infarction. Also evidence of multiple infarcts in the past. At that time patient also had metabolic encephalopathy. Family also noted that he seemed to be weak summer to the last admission. Patient is on blood thinners.      Past Medical History:  Diagnosis Date  . AAA (abdominal aortic aneurysm) (HCC)   . Anemia   . Asthma   . Carotid artery disease (HCC)   . CHF (congestive heart failure) (HCC)   . Coronary artery disease   . Depression   . Gout   . H. pylori infection   . Hard of hearing   . Hiatal hernia   . Hyperplasia, prostate   . Hypertension   . PUD (peptic ulcer disease)   . Renal failure, acute (HCC) 11/08/2012    Patient Active Problem List   Diagnosis Date Noted  . Cerebral thrombosis with cerebral infarction 10/10/2016  . Acute metabolic encephalopathy 10/09/2016  . Acute kidney injury superimposed on chronic kidney disease (HCC) 10/09/2016  . CVA (cerebral vascular accident) (HCC) 07/18/2016  . Stroke-like symptoms 07/18/2016  . Stroke (HCC) 07/18/2016  . Anemia 07/18/2016  . Non-English speaking patient 07/18/2016  . COPD, group B, by GOLD 2017 classification (HCC)   . Hypoxemia   . Atelectasis   . HAP (hospital-acquired pneumonia)   . Acute on chronic respiratory failure (HCC)   . Severe sepsis (HCC) 10/03/2015  . HCAP (healthcare-associated pneumonia) 10/03/2015  . Elevated troponin 10/03/2015  . Nausea,  vomiting and diarrhea 10/03/2015  . Abdominal pain 10/03/2015  . Pneumonia 07/27/2015  . CKD (chronic kidney disease), stage III 07/27/2015  . Carotid artery disease (HCC) 07/27/2015  . CAP (community acquired pneumonia) 07/27/2015  . Generalized weakness 06/18/2014  . Hyponatremia 06/18/2014  . Dizziness 06/18/2014  . Dizzy 06/18/2014  . Pain in the chest   . Paroxysmal atrial fibrillation (HCC) 11/16/2013  . Diarrhea 11/16/2013  . Vomiting 11/16/2013  . Urticaria 11/16/2013  . Ischemic cardiomyopathy 11/16/2013  . Headache 11/16/2013  . Chronic anticoagulation 11/16/2013  . Neck pain on left side 11/16/2013  . Acute chest pain 11/16/2013  . Chest pain at rest 11/16/2013  . Coronary artery disease 10/14/2013  . Chronic systolic heart failure (HCC) 10/14/2013  . AAA (abdominal aortic aneurysm) without rupture (HCC) 09/08/2013  . Hyperlipidemia 06/08/2013  . History of CVA (cerebrovascular accident) 05/02/2013  . Dyspnea 04/29/2013  . Acute CHF- presume secondary to AF and diastolic dysfunction. (Nl LVF 2010) 04/29/2013  . Acute renal failure superimposed on stage 3 chronic kidney disease (HCC) 04/29/2013  . PVD (peripheral vascular disease) 3.5cm AAA Aug 2014 04/29/2013  . NSTEMI - ? type 2 - Troponin 0.63 04/28/2013  . Chronic bilateral lower abdominal pain 02/17/2013  . Hypertension     Past Surgical History:  Procedure Laterality Date  . CAROTID STENT INSERTION  2015  . CAROTID STENT INSERTION N/A 05/09/2013   Procedure: CAROTID STENT INSERTION;  Surgeon: Runell Gess, MD;  Location: [facility] CATH LAB;  Service: Cardiovascular;  Laterality: N/A;  . ESOPHAGOGASTRODUODENOSCOPY  06/13/2008,12/31/12  . LEFT HEART CATHETERIZATION WITH CORONARY ANGIOGRAM N/A 05/04/2013   Procedure: LEFT HEART CATHETERIZATION WITH CORONARY ANGIOGRAM;  Surgeon: Peter M [place], MD;  Location: [facility] CATH LAB;  Service: Cardiovascular;  Laterality: N/A;       Home Medications    Prior to Admission  medications   Medication Sig Start Date End Date Taking? Authorizing Provider  allopurinol (ZYLOPRIM) 300 MG tablet Take 300 mg by mouth daily. To prevent gout    [provider]  arformoterol (BROVANA) 15 MCG/2ML NEBU Take 2 mLs (15 mcg total) by nebulization 2 (two) times daily. 10/10/15   Rhetta Mura, MD  aspirin EC 81 MG tablet Take 1 tablet (81 mg total) by mouth daily. 09/08/13   Marvel Plan, MD  atorvastatin (LIPITOR) 40 MG tablet Take 1 tablet (40 mg total) by mouth daily at 6 PM. 05/11/13   Robbie Lis M, PA-C  budesonide (PULMICORT) 0.5 MG/2ML nebulizer solution Take 2 mLs (0.5 mg total) by nebulization 2 (two) times daily. 10/10/15   Rhetta Mura, MD  carvedilol (COREG) 6.25 MG tablet Take 1 tablet (6.25 mg total) by mouth 2 (two) times daily with a meal. 07/23/16   Runell Gess, MD  dabigatran (PRADAXA) 75 MG CAPS capsule Take 1 capsule (75 mg total) by mouth every 12 (twelve) hours. 07/21/16 10/09/16  Cleora Fleet, MD  esomeprazole (NEXIUM) 40 MG capsule Take 1 capsule (40 mg total) by mouth 2 (two) times daily before a meal. 07/21/16   Johnson, Clanford L, MD  isosorbide mononitrate (IMDUR) 30 MG 24 hr tablet Take 30 mg by mouth once daily Patient taking differently: Take 30 mg by mouth daily. For control of heart pain 07/31/15   Margarita Grizzle, MD  meclizine (ANTIVERT) 25 MG tablet Take 25 mg by mouth every 8 (eight) hours.  09/05/16   [provider]  NITROSTAT 0.4 MG SL tablet PLACE 1 TABLET UNDER THE TONGUE EVERY 5 MINUTES FOR 3 DOSES AS NEEDED FOR CHEST PAIN 10/29/15   Runell Gess, MD  tamsulosin (FLOMAX) 0.4 MG CAPS capsule Take 0.4 mg by mouth daily. To improve bladder function    [provider]    Family History Family History  Problem Relation Age of Onset  . Colon cancer Neg Hx     Social History Social History  Substance Use Topics  . Smoking status: Former Smoker    Quit date: 02/17/2002  . Smokeless tobacco:  Never Used     Comment: Quit seven years ago   . Alcohol use No     Allergies   Levofloxacin; Eliquis [apixaban]; Phenazopyridine hcl; Septra [sulfamethoxazole-trimethoprim]; Latex; and Tape   Review of Systems Review of Systems  Constitutional: Negative for fever.  HENT: Negative for congestion.   Eyes: Positive for visual disturbance.  Respiratory: Negative for shortness of breath.   Cardiovascular: Negative for chest pain.  Gastrointestinal: Negative for abdominal pain.  Genitourinary: Negative for dysuria.  Musculoskeletal: Negative for neck pain.  Neurological: Positive for weakness. Negative for headaches.  Hematological: Bruises/bleeds easily.  Psychiatric/Behavioral: Positive for confusion.     Physical Exam Updated Vital Signs BP (!) 175/80   Pulse 81   Temp 97.6 F (36.4 C) (Oral)   Resp (!) 28   Ht 1.524 m (5')   Wt 63.5 kg (140 lb)   SpO2 97%   BMI 27.34 kg/m   Physical Exam  Constitutional: He  appears well-developed and well-nourished. No distress.  HENT:  Head: Normocephalic and atraumatic.  Eyes:  Eyes with gaze to the left side. Seems to be ignoring any visual stuff on the right  Neck: Neck supple.  Cardiovascular: Normal rate.   Irregular  Pulmonary/Chest: Effort normal and breath sounds normal. No respiratory distress.  Abdominal: Soft. Bowel sounds are normal. There is no tenderness.  Neurological: He is alert. A cranial nerve deficit is present. He exhibits abnormal muscle tone.  Right-sided weakness of lower extremity greater than upper extremity, leftward gaze of vision as described  Skin: Skin is warm.  Nursing note and vitals reviewed.    ED Treatments / Results  Labs (all labs ordered are listed, but only abnormal results are displayed) Labs Reviewed  PROTIME-INR - Abnormal; Notable for the following:       Result Value   Prothrombin Time 16.0 (*)    All other components within normal limits  APTT - Abnormal; Notable for the  following:    aPTT 59 (*)    All other components within normal limits  CBC - Abnormal; Notable for the following:    RBC 3.28 (*)    Hemoglobin 10.4 (*)    HCT 30.9 (*)    All other components within normal limits  COMPREHENSIVE METABOLIC PANEL - Abnormal; Notable for the following:    Sodium 130 (*)    Chloride 97 (*)    Glucose, Bld 126 (*)    Creatinine, Ser 1.57 (*)    Total Protein 8.5 (*)    Albumin 3.4 (*)    ALT 15 (*)    GFR calc non Af Amer 38 (*)    GFR calc Af Amer 44 (*)    All other components within normal limits  URINALYSIS, ROUTINE W REFLEX MICROSCOPIC - Abnormal; Notable for the following:    Color, Urine STRAW (*)    Hgb urine dipstick SMALL (*)    All other components within normal limits  CBG MONITORING, ED - Abnormal; Notable for the following:    Glucose-Capillary 113 (*)    All other components within normal limits  I-STAT CHEM 8, ED - Abnormal; Notable for the following:    Sodium 133 (*)    Chloride 96 (*)    Creatinine, Ser 1.60 (*)    Glucose, Bld 120 (*)    Hemoglobin 11.2 (*)    HCT 33.0 (*)    All other components within normal limits  DIFFERENTIAL  I-STAT TROPONIN, ED  CBG MONITORING, ED    EKG  EKG Interpretation  Date/Time:  Saturday October 18 2016 07:50:31 EDT Ventricular Rate:  87 PR Interval:    QRS Duration: 122 QT Interval:  405 QTC Calculation: 488 R Axis:   71 Text Interpretation:  Atrial fibrillation Nonspecific intraventricular conduction delay Consider anterior infarct Baseline wander in lead(s) V4 Confirmed by Vanetta Mulders [phone]) on 10/18/2016 9:13:23 AM       Radiology Ct Angio Head W Or Wo Contrast  Result Date: 10/18/2016 CLINICAL DATA:  Code stroke.  Recent stroke with neuro changes. EXAM: CT ANGIOGRAPHY HEAD AND NECK CT PERFUSION BRAIN TECHNIQUE: Multidetector CT imaging of the head and neck was performed using the standard protocol during bolus administration of intravenous contrast. Multiplanar CT image  reconstructions and MIPs were obtained to evaluate the vascular anatomy. Carotid stenosis measurements (when applicable) are obtained utilizing NASCET criteria, using the distal internal carotid diameter as the denominator. Multiphase CT imaging of the brain was performed following  IV bolus contrast injection. Subsequent parametric perfusion maps were calculated using RAPID software. CONTRAST:  100 mL Isovue 370 IV COMPARISON:  CT head 10/18/2016, MRI 10/09/2016 FINDINGS: CTA NECK FINDINGS Aortic arch: Advanced atherosclerotic disease in the aortic arch. Proximal great vessels patent with mild atherosclerotic disease Right carotid system: Mild atherosclerotic disease of the right carotid bifurcation without significant stenosis Left carotid system: Stent across the left carotid bifurcation extending into the left internal carotid artery. Stent is widely patent. Moderate stenosis origin of the external carotid artery. Vertebral arteries: Both vertebral arteries patent to the basilar without significant stenosis. Mild atherosclerotic disease distal left vertebral artery. Skeleton: Cervical degenerative change. No acute skeletal abnormality. Other neck: Negative Upper chest: Negative Review of the MIP images confirms the above findings CTA HEAD FINDINGS Anterior circulation: Atherosclerotic calcification right cavernous carotid without significant stenosis. Moderate stenosis right M1/M2. Right anterior cerebral artery patent. Atherosclerotic calcification left cavernous carotid without significant stenosis. Left anterior and middle cerebral artery patent without significant stenosis Posterior circulation: Both vertebral arteries contribute to the basilar. Mild stenosis distal left vertebral artery. Left vertebral artery dominant. Right PICA patent. Left PICA appears supplied from the left AICA. Basilar widely patent. Fetal origin of the posterior cerebral artery on the left. Both posterior cerebral arteries widely  patent without stenosis. Venous sinuses: Patent Anatomic variants: None Delayed phase: Not performed Review of the MIP images confirms the above findings CT Brain Perfusion Findings: CBF (<30%) Volume: 8 mLmL in the left occipital lobe Perfusion (Tmax>6.0s) volume: 97 mLmL. Delayed perfusion is present in the left occipital lobe as well as in the left watershed territory. Delayed perfusion also noted in the right temporal and occipital lobe. Mismatch Volume: 89mL Infarction Location:Left occipital lobe small infarct. Bilateral delayed perfusion IMPRESSION: Carotid and vertebral arteries are patent in the neck with mild atherosclerotic disease. Left carotid stent widely patent. No emergent large vessel occlusion CT perfusion suggests small area of infarct in the left occipital lobe. Bilateral delayed perfusion may be related to acute ischemia or severe intracranial small vessel atherosclerotic disease. These results were called by telephone at the time of interpretation on 10/18/2016 at 9:15 am to Dr. Wilford Corner, who verbally acknowledged these results. Electronically Signed   By: Marlan Palau M.D.   On: 10/18/2016 09:16   Ct Angio Neck W Or Wo Contrast  Result Date: 10/18/2016 CLINICAL DATA:  Code stroke.  Recent stroke with neuro changes. EXAM: CT ANGIOGRAPHY HEAD AND NECK CT PERFUSION BRAIN TECHNIQUE: Multidetector CT imaging of the head and neck was performed using the standard protocol during bolus administration of intravenous contrast. Multiplanar CT image reconstructions and MIPs were obtained to evaluate the vascular anatomy. Carotid stenosis measurements (when applicable) are obtained utilizing NASCET criteria, using the distal internal carotid diameter as the denominator. Multiphase CT imaging of the brain was performed following IV bolus contrast injection. Subsequent parametric perfusion maps were calculated using RAPID software. CONTRAST:  100 mL Isovue 370 IV COMPARISON:  CT head 10/18/2016, MRI  10/09/2016 FINDINGS: CTA NECK FINDINGS Aortic arch: Advanced atherosclerotic disease in the aortic arch. Proximal great vessels patent with mild atherosclerotic disease Right carotid system: Mild atherosclerotic disease of the right carotid bifurcation without significant stenosis Left carotid system: Stent across the left carotid bifurcation extending into the left internal carotid artery. Stent is widely patent. Moderate stenosis origin of the external carotid artery. Vertebral arteries: Both vertebral arteries patent to the basilar without significant stenosis. Mild atherosclerotic disease distal left vertebral artery. Skeleton: Cervical degenerative  change. No acute skeletal abnormality. Other neck: Negative Upper chest: Negative Review of the MIP images confirms the above findings CTA HEAD FINDINGS Anterior circulation: Atherosclerotic calcification right cavernous carotid without significant stenosis. Moderate stenosis right M1/M2. Right anterior cerebral artery patent. Atherosclerotic calcification left cavernous carotid without significant stenosis. Left anterior and middle cerebral artery patent without significant stenosis Posterior circulation: Both vertebral arteries contribute to the basilar. Mild stenosis distal left vertebral artery. Left vertebral artery dominant. Right PICA patent. Left PICA appears supplied from the left AICA. Basilar widely patent. Fetal origin of the posterior cerebral artery on the left. Both posterior cerebral arteries widely patent without stenosis. Venous sinuses: Patent Anatomic variants: None Delayed phase: Not performed Review of the MIP images confirms the above findings CT Brain Perfusion Findings: CBF (<30%) Volume: 8 mLmL in the left occipital lobe Perfusion (Tmax>6.0s) volume: 97 mLmL. Delayed perfusion is present in the left occipital lobe as well as in the left watershed territory. Delayed perfusion also noted in the right temporal and occipital lobe. Mismatch  Volume: 89mL Infarction Location:Left occipital lobe small infarct. Bilateral delayed perfusion IMPRESSION: Carotid and vertebral arteries are patent in the neck with mild atherosclerotic disease. Left carotid stent widely patent. No emergent large vessel occlusion CT perfusion suggests small area of infarct in the left occipital lobe. Bilateral delayed perfusion may be related to acute ischemia or severe intracranial small vessel atherosclerotic disease. These results were called by telephone at the time of interpretation on 10/18/2016 at 9:15 am to Dr. Wilford Corner, who verbally acknowledged these results. Electronically Signed   By: Marlan Palau M.D.   On: 10/18/2016 09:16   Mr Brain Wo Contrast  Result Date: 10/18/2016 CLINICAL DATA:  Stroke EXAM: MRI HEAD WITHOUT CONTRAST TECHNIQUE: Multiplanar, multiecho pulse sequences of the brain and surrounding structures were obtained without intravenous contrast. COMPARISON:  CT and CT perfusion 10/18/2016 FINDINGS: Brain: Image quality degraded by motion Acute infarct in the left occipital parietal cortex corresponding to the CT perfusion area. Additional small areas of acute infarct in the left superior thalamus, left medial parietal lobe, and left frontal lobe, and deep white matter on the left. Distribution is suggestive of watershed ischemia. Small area of acute infarct in the posterior limb internal capsule is subacute and was present on MRI of 10/09/2016 Chronic microhemorrhage right thalamus unchanged from the prior MRI. Generalized atrophy. Chronic infarct right frontal lobe and throughout the cerebral white matter bilaterally. Bifrontal subdural hygroma unchanged. No midline shift. Vascular: Normal arterial flow voids Skull and upper cervical spine: Negative Sinuses/Orbits: Negative Other: None IMPRESSION: Interval development of acute infarct in the left occipital parietal cortex. Watershed areas of acute infarct in the left frontal and parietal lobe. Acute  infarct left superior thalamus. Subacute infarct posterior limb internal capsule on the right unchanged from recent MRI Atrophy and chronic ischemic changes. Electronically Signed   By: Marlan Palau M.D.   On: 10/18/2016 10:27   Ct Cerebral Perfusion W Contrast  Result Date: 10/18/2016 CLINICAL DATA:  Code stroke.  Recent stroke with neuro changes. EXAM: CT ANGIOGRAPHY HEAD AND NECK CT PERFUSION BRAIN TECHNIQUE: Multidetector CT imaging of the head and neck was performed using the standard protocol during bolus administration of intravenous contrast. Multiplanar CT image reconstructions and MIPs were obtained to evaluate the vascular anatomy. Carotid stenosis measurements (when applicable) are obtained utilizing NASCET criteria, using the distal internal carotid diameter as the denominator. Multiphase CT imaging of the brain was performed following IV bolus contrast injection. Subsequent  parametric perfusion maps were calculated using RAPID software. CONTRAST:  100 mL Isovue 370 IV COMPARISON:  CT head 10/18/2016, MRI 10/09/2016 FINDINGS: CTA NECK FINDINGS Aortic arch: Advanced atherosclerotic disease in the aortic arch. Proximal great vessels patent with mild atherosclerotic disease Right carotid system: Mild atherosclerotic disease of the right carotid bifurcation without significant stenosis Left carotid system: Stent across the left carotid bifurcation extending into the left internal carotid artery. Stent is widely patent. Moderate stenosis origin of the external carotid artery. Vertebral arteries: Both vertebral arteries patent to the basilar without significant stenosis. Mild atherosclerotic disease distal left vertebral artery. Skeleton: Cervical degenerative change. No acute skeletal abnormality. Other neck: Negative Upper chest: Negative Review of the MIP images confirms the above findings CTA HEAD FINDINGS Anterior circulation: Atherosclerotic calcification right cavernous carotid without significant  stenosis. Moderate stenosis right M1/M2. Right anterior cerebral artery patent. Atherosclerotic calcification left cavernous carotid without significant stenosis. Left anterior and middle cerebral artery patent without significant stenosis Posterior circulation: Both vertebral arteries contribute to the basilar. Mild stenosis distal left vertebral artery. Left vertebral artery dominant. Right PICA patent. Left PICA appears supplied from the left AICA. Basilar widely patent. Fetal origin of the posterior cerebral artery on the left. Both posterior cerebral arteries widely patent without stenosis. Venous sinuses: Patent Anatomic variants: None Delayed phase: Not performed Review of the MIP images confirms the above findings CT Brain Perfusion Findings: CBF (<30%) Volume: 8 mLmL in the left occipital lobe Perfusion (Tmax>6.0s) volume: 97 mLmL. Delayed perfusion is present in the left occipital lobe as well as in the left watershed territory. Delayed perfusion also noted in the right temporal and occipital lobe. Mismatch Volume: 89mL Infarction Location:Left occipital lobe small infarct. Bilateral delayed perfusion IMPRESSION: Carotid and vertebral arteries are patent in the neck with mild atherosclerotic disease. Left carotid stent widely patent. No emergent large vessel occlusion CT perfusion suggests small area of infarct in the left occipital lobe. Bilateral delayed perfusion may be related to acute ischemia or severe intracranial small vessel atherosclerotic disease. These results were called by telephone at the time of interpretation on 10/18/2016 at 9:15 am to Dr. Wilford Corner, who verbally acknowledged these results. Electronically Signed   By: Marlan Palau M.D.   On: 10/18/2016 09:16   Ct Head Code Stroke Wo Contrast`  Result Date: 10/18/2016 CLINICAL DATA:  Code stroke. Subacute neuro deficits. History of recent stroke EXAM: CT HEAD WITHOUT CONTRAST TECHNIQUE: Contiguous axial images were obtained from the base of  the skull through the vertex without intravenous contrast. COMPARISON:  CT 10/08/2016, MRI 10/09/2016 FINDINGS: Brain: Recent infarct in the posterior limb internal capsule on the right. No progression of this area. Negative for acute hemorrhage. Negative for acute infarct or mass Generalized moderate atrophy. Negative for hydrocephalus. Extensive chronic ischemic change throughout the white matter. Chronic infarcts in the right frontal lobe, and left frontal convexity. Small chronic infarct left occipital lobe. Chronic infarct left thalamus. Vascular: Negative for hyperdense vessel. Extensive atherosclerotic calcification Skull: Chronic fracture right zygomatic arch Sinuses/Orbits: Right mastoid sinus effusion. Paranasal sinuses clear. Other: None ASPECTS ([place] Stroke Program Early CT Score) - Ganglionic level infarction (caudate, lentiform nuclei, internal capsule, insula, M1-M3 cortex): 7 - Supraganglionic infarction (M4-M6 cortex): 3 Total score (0-10 with 10 being normal): 10 IMPRESSION: 1. No acute abnormality. Recent infarct posterior limb internal capsule 2. ASPECTS is 10 3. Extensive chronic ischemic change These results were called by telephone at the time of interpretation on 10/18/2016 at 8:38 am to Dr.  Wilford Corner, who verbally acknowledged these results. Electronically Signed   By: Marlan Palau M.D.   On: 10/18/2016 08:39    Procedures Procedures (including critical care time)  CRITICAL CARE Performed by: Vanetta Mulders Total critical care time: 30 minutes Critical care time was exclusive of separately billable procedures and treating other patients. Critical care was necessary to treat or prevent imminent or life-threatening deterioration. Critical care was time spent personally by me on the following activities: development of treatment plan with patient and/or surrogate as well as nursing, discussions with consultants, evaluation of patient's response to treatment, examination of patient,  obtaining history from patient or surrogate, ordering and performing treatments and interventions, ordering and review of laboratory studies, ordering and review of radiographic studies, pulse oximetry and re-evaluation of patient's condition.   Medications Ordered in ED Medications  iopamidol (ISOVUE-370) 76 % injection 90 mL (90 mLs Intravenous Contrast Given 10/18/16 0840)     Initial Impression / Assessment and Plan / ED Course  I have reviewed the triage vital signs and the nursing notes.  Pertinent labs & imaging results that were available during my care of the patient were reviewed by me and considered in my medical decision making (see chart for details).     The patient presenting with concerns for recurrent or new stroke. Patient last normal was 11:00 yesterday morning. Patient had a history of strokes in the past. Patient is on blood thinners.  Code stroke was called because patient was within not 24 hours and was potential large vessel involvement since he had weakness on the right side. Patient also had a leftward gaze which was difficult to explain.  Head CT without any acute findings that raise some concern for infarct. MRI confirmed infarct.   Code stroke was called because was less than 24 hours and focal deficits could represent of large vessel occlusion. Patient seen by stroke team. No acute intervention recommended patient will require admission to hospitalist service. Stroke team is considering echo and perhaps changing his blood thinner.      Final Clinical Impressions(s) / ED Diagnoses   Final diagnoses:  Cerebrovascular accident (CVA), unspecified mechanism (HCC)    New Prescriptions New Prescriptions   No medications on file     Vanetta Mulders, MD 10/18/16 1053

## 2016-10-18 NOTE — ED Triage Notes (Signed)
Per Family, Pt speaks [place], but will not respond well to family even in language. Family reports generalized weakness that was noted this morning when he woke up. Pt has hx of CHF, Stroke, and AAA. Pt is confused.

## 2016-10-18 NOTE — ED Notes (Signed)
Unsure of LSN, pt family states he was normal yesterday possibly 11am.

## 2016-10-19 ENCOUNTER — Encounter (HOSPITAL_COMMUNITY): Payer: Self-pay

## 2016-10-19 ENCOUNTER — Inpatient Hospital Stay (HOSPITAL_COMMUNITY): Payer: Medicare Other

## 2016-10-19 DIAGNOSIS — I6522 Occlusion and stenosis of left carotid artery: Secondary | ICD-10-CM

## 2016-10-19 DIAGNOSIS — I361 Nonrheumatic tricuspid (valve) insufficiency: Secondary | ICD-10-CM

## 2016-10-19 DIAGNOSIS — I639 Cerebral infarction, unspecified: Principal | ICD-10-CM

## 2016-10-19 LAB — ECHOCARDIOGRAM COMPLETE
Height: 60 in
Weight: 2240 oz

## 2016-10-19 LAB — LIPID PANEL
Cholesterol: 83 mg/dL (ref 0–200)
HDL: 25 mg/dL — AB (ref >40)
LDL CALC: 45 mg/dL (ref 0–99)
TRIGLYCERIDES: 64 mg/dL (ref <150)
Total CHOL/HDL Ratio: 3.3 RATIO
VLDL: 13 mg/dL (ref 0–40)

## 2016-10-19 LAB — HEMOGLOBIN A1C
Hgb A1c MFr Bld: 6 % — ABNORMAL HIGH (ref 4.8–5.6)
Mean Plasma Glucose: 125.5 mg/dL

## 2016-10-19 MED ORDER — ASPIRIN EC 325 MG PO TBEC
325.0000 mg | DELAYED_RELEASE_TABLET | Freq: Every day | ORAL | Status: DC
  Administered 2016-10-19 – 2016-10-20 (×2): 325 mg via ORAL
  Filled 2016-10-19 (×2): qty 1

## 2016-10-19 NOTE — Evaluation (Signed)
Occupational Therapy Evaluation and Defer to Home Health OT Patient Details Name: [name] MRN: [id number] DOB: 08/10/1926 Today's Date: 10/19/2016    History of Present Illness [name] is a 83 y.o. male with a history of atrial fibrillation with chads 2 vascular score of 6, CAD, history of multiple strokes on anticoagulation, with his most recent stroke at the end of August 2018 while on Pradaxa; At time of admission, appeared more confused, unable to recognize family members, slurred speech, weakness on his right side, preference of leftward gaze; Imaging shows infarct in the left occipital parietal cortex with watershed areas of the left frontal and parietal lobes. He also has a acute infarct of the left superior thalamus.   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 Pt will have 24 hour supervision and assist from very supportive family. Pt will benefit from [facility] follow up to evaluate home environment for fall risk assessment, further visual assessment, and provide environmental caregiver education in addition to therapy to maximize safety and independence in ADL and functional transfers.     Follow Up Recommendations  Home health OT;Supervision/Assistance - 24 hour    Equipment Recommendations  None recommended by OT (Pt has appropriate DME at home)    Recommendations for Other Services       Precautions / Restrictions Precautions Precautions: Fall Restrictions Weight Bearing Restrictions: No      Mobility Bed Mobility Overal bed mobility: Needs Assistance Bed Mobility: Supine to Sit     Supine to sit: Min assist     General bed mobility comments: Pt recieved standing with PT at EOB  Transfers Overall transfer level: Needs  assistance Equipment used: Rolling walker (2 wheeled) Transfers: Sit to/from Stand Sit to Stand: Min assist         General transfer comment: min assist to steady    Balance Overall balance assessment: Needs assistance Sitting-balance support: No upper extremity supported;Feet supported Sitting balance-Leahy Scale: Good Sitting balance - Comments: sitting EOB   Standing balance support: Bilateral upper extremity supported;During functional activity Standing balance-Leahy Scale: Poor Standing balance comment: leans against sink for grooming activity, and reliant on RW                           ADL either performed or assessed with clinical judgement   ADL Overall ADL's : Needs assistance/impaired Eating/Feeding: Set up;Sitting   Grooming: Wash/dry hands;Min guard;Standing Grooming Details (indicate cue type and reason): sink level Upper Body Bathing: Moderate assistance;With caregiver independent assisting;Sitting   Lower Body Bathing: Moderate assistance;With caregiver independent assisting;Sit to/from stand   Upper Body Dressing : Minimal assistance;Sitting   Lower Body Dressing: Maximal assistance;With caregiver independent assisting;Sit to/from stand   Toilet Transfer: Minimal assistance;With caregiver independent assisting;Ambulation;Cueing for safety;RW Toilet Transfer Details (indicate cue type and reason): simulated through recliner transfer Toileting- Clothing Manipulation and Hygiene: Minimal assistance;Sit to/from stand   Tub/ Shower Transfer: Minimal assistance;With caregiver independent assisting;Tub transfer;Shower seat   Functional mobility during ADLs: Minimal assistance;Rolling walker;Caregiver able to provide necessary level of assistance General ADL Comments: Pt will have 24/7 support from family who assist with ADL     Vision   Additional Comments: no left gaze noted this session, not formally assessed     Perception  Praxis       Pertinent Vitals/Pain Pain Assessment: No/denies pain     Hand Dominance Right   Extremity/Trunk Assessment Upper Extremity Assessment Upper Extremity Assessment: Generalized weakness   Lower Extremity Assessment Lower Extremity Assessment: Defer to PT evaluation   Cervical / Trunk Assessment Cervical / Trunk Assessment: Kyphotic   Communication Communication Communication: HOH;Prefers language other than English;Other (comment) (Family assists with communication)   Cognition Arousal/Alertness: Awake/alert Behavior During Therapy: WFL for tasks assessed/performed Overall Cognitive Status: Within Functional Limits for tasks assessed                                     General Comments  Family acting as translator this session    Exercises     Shoulder Instructions      Home Living Family/patient expects to be discharged to:: Private residence Living Arrangements: Spouse/significant other Available Help at Discharge: Family;Available 24 hours/day Type of Home: House Home Access: Stairs to enter [employer] of Steps: 3 Entrance Stairs-Rails: None Home Layout: One level     Bathroom Shower/Tub: Tub only   Firefighter: Standard     Home Equipment: Cane - single point;Walker - standard          Prior Functioning/Environment Level of Independence: Needs assistance  Gait / Transfers Assistance Needed: Pt amb without assistive device most of time. Family assist at times. Daughter reports pt tends to push RW too far out in front  ADL's / Homemaking Assistance Needed: assist for bathing/dressing - self feeds and grooms, does own peri care after toileting            OT Problem List: Decreased activity tolerance;Impaired balance (sitting and/or standing);Decreased knowledge of use of DME or AE      OT Treatment/Interventions:      OT Goals(Current goals can be found in the care plan section) Acute Rehab OT Goals Patient Stated  Goal: not stated  OT Frequency:     Barriers to D/C:            Co-evaluation              AM-PAC PT "6 Clicks" Daily Activity     Outcome Measure Help from another person eating meals?: None Help from another person taking care of personal grooming?: A Little Help from another person toileting, which includes using toliet, bedpan, or urinal?: A Little Help from another person bathing (including washing, rinsing, drying)?: A Lot Help from another person to put on and taking off regular upper body clothing?: A Little Help from another person to put on and taking off regular lower body clothing?: A Lot 6 Click Score: 17   End of Session Equipment Utilized During Treatment: Gait belt;Rolling walker Nurse Communication: Mobility status  Activity Tolerance: Patient tolerated treatment well Patient left: in chair;with call bell/phone within reach;with chair alarm set;with family/visitor present  OT Visit Diagnosis: Unsteadiness on feet (R26.81);Other abnormalities of gait and mobility (R26.89);Muscle weakness (generalized) (M62.81)                Time: 5053-9767 OT Time Calculation (min): 21 min Charges:  OT General Charges $OT Visit: 1 Visit OT Evaluation $OT Eval Moderate Complexity: 1 Mod G-Codes:     Sherryl Manges OTR/L [phone] Evern Bio Aricela Bertagnolli 10/19/2016, 4:54 PM

## 2016-10-19 NOTE — Progress Notes (Signed)
PROGRESS NOTE    [name]  ZOX:[id number] DOB: 1926/12/11 DOA: 10/18/2016 PCP: [name], MD   Chief Complaint  Patient presents with  . Weakness    Brief Narrative:  HPI on 10/18/2016 by Dr. Candelaria Celeste Reznor Ferrando is a 83 y.o. male with a history of atrial fibrillation with chads 2 vascular score of 6, CAD, history of multiple strokes on anticoagulation, with his most recent stroke at the end of August 2018 while on Pradaxa. History is obtained by family members as patient is poor historian. Patient was last seen normal yesterday morning. The patient became gradually more weak and was unable to the pertussis. And his physical therapy. This morning, the patient woke up and appeared more confused, unable to recognize family members, slurred speech, weakness on his right side, preference of leftward gaze. Patient was brought to the emergency department by private vehicle. He was not considered a TPA candidate due to being on Pradaxa. Since being in the emergency department, his symptoms have been improving.  The patient was recently on Xarelto for anticoagulation until 07/2016, when he was switched to Pradaxa due to treatment failure. He has never been on Coumadin or Lovenox/heparin Assessment & Plan   Acute CVA -Patient was on Pradaxa for prior CVAs- currently renally adjusted -CT head: No acute abnormality. Recent infarct posterior limb internal capsule. -MRI brain: Interval development of acute infarct of the left occipital parietal cortex. Watershed areas of acute infarct in the left frontal and parietal lobe. Acute infarct left superior thalamus. Subacute infarct posterior limb internal capsule on the right (unchanged from prior MRI) -CT cerebral perfusion: Carotid vertebral arteries are patent in the neck with mild atherosclerotic disease. Left carotid stent widely patent. Small area of infarct in the left occipital lobe. -Echocardiogram pending -Hemoglobin A1c 6, HDL  45 -PT, OT consulted -Speech consulted, recommended dysphagia 2 diet with thin liquids, no straws. -Neurology consulted and appreciated, pending further recommendations -? Should patient be switched to Coumadin or Lovenox -Continue aspirin, statin  Essential hypertension -Continue coreg  Chronic systolic heart failure -Echocardiogram 07/18/2016 showed EF of 45-50%, hypokinesis of entire inferior lateral myocardium, basal inferior myocardium -Currently appears to be euvolemic and compensated -Monitor intake and output, daily weights -Repeat echo pending -Continue coreg, statin  Paroxysmal atrial fibrillation on chronic anticoagulation -was on pradaxa -pending further recommendations from neurology regarding anticoagulation  Chronic kidney disease, stage III  -Creatinine appears to be at baseline, continue to monitor BMP  Goals of care -Discussed CODE STATUS patient's son. Patient is non-English-speaking, however son refused the use of an interpreter. -Patient's son feels patient should be DO NOT RESUSCITATE but would like to confirm and discussed with his sister.  DVT Prophylaxis  heparin  Code Status: Full  Family Communication: Family at bedside  Disposition Plan: Admitted, pending further recommendations from neurology, PT and OT consultations  Consultants Neurology  Procedures  Echocardiogram  Antibiotics   Anti-infectives    None      Subjective:   [name] seen and examined today.  Patient's son uses an interpreter, as he refused to use interpreter. Patient with no complaints this morning. Per son, patient has had periods of forgetfulness however this morning seems to be back to baseline. No complaints of chest pain shortness breath, abdominal pain, nausea or vomiting.  Objective:   Vitals:   10/19/16 0400 10/19/16 0605 10/19/16 0858 10/19/16 0931  BP: (!) 104/59 132/67  (!) 146/74  Pulse: 72 78  74  Resp: 16  16  20  Temp: 98.8 F (37.1 C)  98.8 F (37.1 C)  98.9 F (37.2 C)  TempSrc: Oral Oral  Oral  SpO2: 98% 98% 98% 98%  Weight:      Height:        Intake/Output Summary (Last 24 hours) at 10/19/16 1323 Last data filed at 10/18/16 1713  Gross per 24 hour  Intake                0 ml  Output                0 ml  Net                0 ml   Filed Weights   10/18/16 0751  Weight: 63.5 kg (140 lb)    Exam  General: Well developed, well nourished, NAD, appears stated age  HEENT: NCAT, mucous membranes moist.   Cardiovascular: S1 S2 auscultated, Irregular  Respiratory: Clear to auscultation bilaterally with equal chest rise  Abdomen: Soft, nontender, nondistended, + bowel sounds  Extremities: warm dry without cyanosis clubbing or edema  Neuro: Awake and alert, appears oriented. Difficult to assess given language barrier  Psych: appropriate   Data Reviewed: I have personally reviewed following labs and imaging studies  CBC:  Recent Labs Lab 10/18/16 0800 10/18/16 0810  WBC 7.6  --   NEUTROABS 5.5  --   HGB 10.4* 11.2*  HCT 30.9* 33.0*  MCV 94.2  --   PLT 209  --    Basic Metabolic Panel:  Recent Labs Lab 10/18/16 0800 10/18/16 0810  NA 130* 133*  K 4.4 4.5  CL 97* 96*  CO2 27  --   GLUCOSE 126* 120*  BUN 13 15  CREATININE 1.57* 1.60*  CALCIUM 9.1  --    GFR: Estimated Creatinine Clearance: 25 mL/min (A) (by C-G formula based on SCr of 1.6 mg/dL (H)). Liver Function Tests:  Recent Labs Lab 10/18/16 0800  AST 26  ALT 15*  ALKPHOS 80  BILITOT 1.1  PROT 8.5*  ALBUMIN 3.4*   No results for input(s): LIPASE, AMYLASE in the last 168 hours. No results for input(s): AMMONIA in the last 168 hours. Coagulation Profile:  Recent Labs Lab 10/18/16 0800  INR 1.29   Cardiac Enzymes: No results for input(s): CKTOTAL, CKMB, CKMBINDEX, TROPONINI in the last 168 hours. BNP (last 3 results) No results for input(s): PROBNP in the last 8760 hours. HbA1C:  Recent Labs  10/19/16 0423    HGBA1C 6.0*   CBG:  Recent Labs Lab 10/18/16 0753  GLUCAP 113*   Lipid Profile:  Recent Labs  10/19/16 0423  CHOL 83  HDL 25*  LDLCALC 45  TRIG 64  CHOLHDL 3.3   Thyroid Function Tests: No results for input(s): TSH, T4TOTAL, FREET4, T3FREE, THYROIDAB in the last 72 hours. Anemia Panel: No results for input(s): VITAMINB12, FOLATE, FERRITIN, TIBC, IRON, RETICCTPCT in the last 72 hours. Urine analysis:    Component Value Date/Time   COLORURINE STRAW (A) 10/18/2016 0949   APPEARANCEUR CLEAR 10/18/2016 0949   LABSPEC 1.021 10/18/2016 0949   PHURINE 7.0 10/18/2016 0949   GLUCOSEU NEGATIVE 10/18/2016 0949   HGBUR SMALL (A) 10/18/2016 0949   BILIRUBINUR NEGATIVE 10/18/2016 0949   KETONESUR NEGATIVE 10/18/2016 0949   PROTEINUR NEGATIVE 10/18/2016 0949   UROBILINOGEN 0.2 07/07/2014 0604   NITRITE NEGATIVE 10/18/2016 0949   LEUKOCYTESUR NEGATIVE 10/18/2016 0949   Sepsis Labs: @LABRCNTIP (procalcitonin:4,lacticidven:4)  )No results found for this or  any previous visit (from the past 240 hour(s)).    Radiology Studies: Ct Angio Head W Or Wo Contrast  Result Date: 10/18/2016 CLINICAL DATA:  Code stroke.  Recent stroke with neuro changes. EXAM: CT ANGIOGRAPHY HEAD AND NECK CT PERFUSION BRAIN TECHNIQUE: Multidetector CT imaging of the head and neck was performed using the standard protocol during bolus administration of intravenous contrast. Multiplanar CT image reconstructions and MIPs were obtained to evaluate the vascular anatomy. Carotid stenosis measurements (when applicable) are obtained utilizing NASCET criteria, using the distal internal carotid diameter as the denominator. Multiphase CT imaging of the brain was performed following IV bolus contrast injection. Subsequent parametric perfusion maps were calculated using RAPID software. CONTRAST:  100 mL Isovue 370 IV COMPARISON:  CT head 10/18/2016, MRI 10/09/2016 FINDINGS: CTA NECK FINDINGS Aortic arch: Advanced  atherosclerotic disease in the aortic arch. Proximal great vessels patent with mild atherosclerotic disease Right carotid system: Mild atherosclerotic disease of the right carotid bifurcation without significant stenosis Left carotid system: Stent across the left carotid bifurcation extending into the left internal carotid artery. Stent is widely patent. Moderate stenosis origin of the external carotid artery. Vertebral arteries: Both vertebral arteries patent to the basilar without significant stenosis. Mild atherosclerotic disease distal left vertebral artery. Skeleton: Cervical degenerative change. No acute skeletal abnormality. Other neck: Negative Upper chest: Negative Review of the MIP images confirms the above findings CTA HEAD FINDINGS Anterior circulation: Atherosclerotic calcification right cavernous carotid without significant stenosis. Moderate stenosis right M1/M2. Right anterior cerebral artery patent. Atherosclerotic calcification left cavernous carotid without significant stenosis. Left anterior and middle cerebral artery patent without significant stenosis Posterior circulation: Both vertebral arteries contribute to the basilar. Mild stenosis distal left vertebral artery. Left vertebral artery dominant. Right PICA patent. Left PICA appears supplied from the left AICA. Basilar widely patent. Fetal origin of the posterior cerebral artery on the left. Both posterior cerebral arteries widely patent without stenosis. Venous sinuses: Patent Anatomic variants: None Delayed phase: Not performed Review of the MIP images confirms the above findings CT Brain Perfusion Findings: CBF (<30%) Volume: 8 mLmL in the left occipital lobe Perfusion (Tmax>6.0s) volume: 97 mLmL. Delayed perfusion is present in the left occipital lobe as well as in the left watershed territory. Delayed perfusion also noted in the right temporal and occipital lobe. Mismatch Volume: 89mL Infarction Location:Left occipital lobe small  infarct. Bilateral delayed perfusion IMPRESSION: Carotid and vertebral arteries are patent in the neck with mild atherosclerotic disease. Left carotid stent widely patent. No emergent large vessel occlusion CT perfusion suggests small area of infarct in the left occipital lobe. Bilateral delayed perfusion may be related to acute ischemia or severe intracranial small vessel atherosclerotic disease. These results were called by telephone at the time of interpretation on 10/18/2016 at 9:15 am to Dr. Wilford Corner, who verbally acknowledged these results. Electronically Signed   By: Marlan Palau M.D.   On: 10/18/2016 09:16   Ct Angio Neck W Or Wo Contrast  Result Date: 10/18/2016 CLINICAL DATA:  Code stroke.  Recent stroke with neuro changes. EXAM: CT ANGIOGRAPHY HEAD AND NECK CT PERFUSION BRAIN TECHNIQUE: Multidetector CT imaging of the head and neck was performed using the standard protocol during bolus administration of intravenous contrast. Multiplanar CT image reconstructions and MIPs were obtained to evaluate the vascular anatomy. Carotid stenosis measurements (when applicable) are obtained utilizing NASCET criteria, using the distal internal carotid diameter as the denominator. Multiphase CT imaging of the brain was performed following IV bolus contrast injection. Subsequent parametric  perfusion maps were calculated using RAPID software. CONTRAST:  100 mL Isovue 370 IV COMPARISON:  CT head 10/18/2016, MRI 10/09/2016 FINDINGS: CTA NECK FINDINGS Aortic arch: Advanced atherosclerotic disease in the aortic arch. Proximal great vessels patent with mild atherosclerotic disease Right carotid system: Mild atherosclerotic disease of the right carotid bifurcation without significant stenosis Left carotid system: Stent across the left carotid bifurcation extending into the left internal carotid artery. Stent is widely patent. Moderate stenosis origin of the external carotid artery. Vertebral arteries: Both vertebral arteries  patent to the basilar without significant stenosis. Mild atherosclerotic disease distal left vertebral artery. Skeleton: Cervical degenerative change. No acute skeletal abnormality. Other neck: Negative Upper chest: Negative Review of the MIP images confirms the above findings CTA HEAD FINDINGS Anterior circulation: Atherosclerotic calcification right cavernous carotid without significant stenosis. Moderate stenosis right M1/M2. Right anterior cerebral artery patent. Atherosclerotic calcification left cavernous carotid without significant stenosis. Left anterior and middle cerebral artery patent without significant stenosis Posterior circulation: Both vertebral arteries contribute to the basilar. Mild stenosis distal left vertebral artery. Left vertebral artery dominant. Right PICA patent. Left PICA appears supplied from the left AICA. Basilar widely patent. Fetal origin of the posterior cerebral artery on the left. Both posterior cerebral arteries widely patent without stenosis. Venous sinuses: Patent Anatomic variants: None Delayed phase: Not performed Review of the MIP images confirms the above findings CT Brain Perfusion Findings: CBF (<30%) Volume: 8 mLmL in the left occipital lobe Perfusion (Tmax>6.0s) volume: 97 mLmL. Delayed perfusion is present in the left occipital lobe as well as in the left watershed territory. Delayed perfusion also noted in the right temporal and occipital lobe. Mismatch Volume: 25mL Infarction Location:Left occipital lobe small infarct. Bilateral delayed perfusion IMPRESSION: Carotid and vertebral arteries are patent in the neck with mild atherosclerotic disease. Left carotid stent widely patent. No emergent large vessel occlusion CT perfusion suggests small area of infarct in the left occipital lobe. Bilateral delayed perfusion may be related to acute ischemia or severe intracranial small vessel atherosclerotic disease. These results were called by telephone at the time of  interpretation on 10/18/2016 at 9:15 am to Dr. Wilford Corner, who verbally acknowledged these results. Electronically Signed   By: Marlan Palau M.D.   On: 10/18/2016 09:16   Mr Brain Wo Contrast  Result Date: 10/18/2016 CLINICAL DATA:  Stroke EXAM: MRI HEAD WITHOUT CONTRAST TECHNIQUE: Multiplanar, multiecho pulse sequences of the brain and surrounding structures were obtained without intravenous contrast. COMPARISON:  CT and CT perfusion 10/18/2016 FINDINGS: Brain: Image quality degraded by motion Acute infarct in the left occipital parietal cortex corresponding to the CT perfusion area. Additional small areas of acute infarct in the left superior thalamus, left medial parietal lobe, and left frontal lobe, and deep white matter on the left. Distribution is suggestive of watershed ischemia. Small area of acute infarct in the posterior limb internal capsule is subacute and was present on MRI of 10/09/2016 Chronic microhemorrhage right thalamus unchanged from the prior MRI. Generalized atrophy. Chronic infarct right frontal lobe and throughout the cerebral white matter bilaterally. Bifrontal subdural hygroma unchanged. No midline shift. Vascular: Normal arterial flow voids Skull and upper cervical spine: Negative Sinuses/Orbits: Negative Other: None IMPRESSION: Interval development of acute infarct in the left occipital parietal cortex. Watershed areas of acute infarct in the left frontal and parietal lobe. Acute infarct left superior thalamus. Subacute infarct posterior limb internal capsule on the right unchanged from recent MRI Atrophy and chronic ischemic changes. Electronically Signed   By: Leonette Most  Chestine Spore M.D.   On: 10/18/2016 10:27   Ct Cerebral Perfusion W Contrast  Result Date: 10/18/2016 CLINICAL DATA:  Code stroke.  Recent stroke with neuro changes. EXAM: CT ANGIOGRAPHY HEAD AND NECK CT PERFUSION BRAIN TECHNIQUE: Multidetector CT imaging of the head and neck was performed using the standard protocol during  bolus administration of intravenous contrast. Multiplanar CT image reconstructions and MIPs were obtained to evaluate the vascular anatomy. Carotid stenosis measurements (when applicable) are obtained utilizing NASCET criteria, using the distal internal carotid diameter as the denominator. Multiphase CT imaging of the brain was performed following IV bolus contrast injection. Subsequent parametric perfusion maps were calculated using RAPID software. CONTRAST:  100 mL Isovue 370 IV COMPARISON:  CT head 10/18/2016, MRI 10/09/2016 FINDINGS: CTA NECK FINDINGS Aortic arch: Advanced atherosclerotic disease in the aortic arch. Proximal great vessels patent with mild atherosclerotic disease Right carotid system: Mild atherosclerotic disease of the right carotid bifurcation without significant stenosis Left carotid system: Stent across the left carotid bifurcation extending into the left internal carotid artery. Stent is widely patent. Moderate stenosis origin of the external carotid artery. Vertebral arteries: Both vertebral arteries patent to the basilar without significant stenosis. Mild atherosclerotic disease distal left vertebral artery. Skeleton: Cervical degenerative change. No acute skeletal abnormality. Other neck: Negative Upper chest: Negative Review of the MIP images confirms the above findings CTA HEAD FINDINGS Anterior circulation: Atherosclerotic calcification right cavernous carotid without significant stenosis. Moderate stenosis right M1/M2. Right anterior cerebral artery patent. Atherosclerotic calcification left cavernous carotid without significant stenosis. Left anterior and middle cerebral artery patent without significant stenosis Posterior circulation: Both vertebral arteries contribute to the basilar. Mild stenosis distal left vertebral artery. Left vertebral artery dominant. Right PICA patent. Left PICA appears supplied from the left AICA. Basilar widely patent. Fetal origin of the posterior  cerebral artery on the left. Both posterior cerebral arteries widely patent without stenosis. Venous sinuses: Patent Anatomic variants: None Delayed phase: Not performed Review of the MIP images confirms the above findings CT Brain Perfusion Findings: CBF (<30%) Volume: 8 mLmL in the left occipital lobe Perfusion (Tmax>6.0s) volume: 97 mLmL. Delayed perfusion is present in the left occipital lobe as well as in the left watershed territory. Delayed perfusion also noted in the right temporal and occipital lobe. Mismatch Volume: 89mL Infarction Location:Left occipital lobe small infarct. Bilateral delayed perfusion IMPRESSION: Carotid and vertebral arteries are patent in the neck with mild atherosclerotic disease. Left carotid stent widely patent. No emergent large vessel occlusion CT perfusion suggests small area of infarct in the left occipital lobe. Bilateral delayed perfusion may be related to acute ischemia or severe intracranial small vessel atherosclerotic disease. These results were called by telephone at the time of interpretation on 10/18/2016 at 9:15 am to Dr. Wilford Corner, who verbally acknowledged these results. Electronically Signed   By: Marlan Palau M.D.   On: 10/18/2016 09:16   Ct Head Code Stroke Wo Contrast`  Result Date: 10/18/2016 CLINICAL DATA:  Code stroke. Subacute neuro deficits. History of recent stroke EXAM: CT HEAD WITHOUT CONTRAST TECHNIQUE: Contiguous axial images were obtained from the base of the skull through the vertex without intravenous contrast. COMPARISON:  CT 10/08/2016, MRI 10/09/2016 FINDINGS: Brain: Recent infarct in the posterior limb internal capsule on the right. No progression of this area. Negative for acute hemorrhage. Negative for acute infarct or mass Generalized moderate atrophy. Negative for hydrocephalus. Extensive chronic ischemic change throughout the white matter. Chronic infarcts in the right frontal lobe, and left frontal convexity. Small  chronic infarct left  occipital lobe. Chronic infarct left thalamus. Vascular: Negative for hyperdense vessel. Extensive atherosclerotic calcification Skull: Chronic fracture right zygomatic arch Sinuses/Orbits: Right mastoid sinus effusion. Paranasal sinuses clear. Other: None ASPECTS ([place] Stroke Program Early CT Score) - Ganglionic level infarction (caudate, lentiform nuclei, internal capsule, insula, M1-M3 cortex): 7 - Supraganglionic infarction (M4-M6 cortex): 3 Total score (0-10 with 10 being normal): 10 IMPRESSION: 1. No acute abnormality. Recent infarct posterior limb internal capsule 2. ASPECTS is 10 3. Extensive chronic ischemic change These results were called by telephone at the time of interpretation on 10/18/2016 at 8:38 am to Dr. Wilford Corner, who verbally acknowledged these results. Electronically Signed   By: Marlan Palau M.D.   On: 10/18/2016 08:39     Scheduled Meds: . allopurinol  300 mg Oral Daily  . arformoterol  15 mcg Nebulization BID  . aspirin EC  325 mg Oral Daily  . atorvastatin  40 mg Oral q1800  . budesonide  0.5 mg Nebulization BID  . carvedilol  6.25 mg Oral BID WC  . heparin  5,000 Units Subcutaneous Q8H  . pantoprazole  80 mg Oral Q1200  . tamsulosin  0.4 mg Oral Daily   Continuous Infusions: . sodium chloride 125 mL/hr at 10/18/16 1713     LOS: 1 day   Time Spent in minutes   30 minutes  Rosibel Giacobbe D.O. on 10/19/2016 at 1:23 PM  Between 7am to 7pm - Pager - [phone]  After 7pm go to [email or web address] - password [email or web address]  And look for the night coverage person covering for me after hours  [facility] Office  [phone]

## 2016-10-19 NOTE — Evaluation (Signed)
Physical Therapy Evaluation Patient Details Name: [name] MRN: [id number] DOB: October 06, 1926 Today's Date: 10/19/2016   History of Present Illness  [name] is a 83 y.o. male with a history of atrial fibrillation with chads 2 vascular score of 6, CAD, history of multiple strokes on anticoagulation, with his most recent stroke at the end of August 2018 while on Pradaxa; At time of admission, appeared more confused, unable to recognize family members, slurred speech, weakness on his right side, preference of leftward gaze; Imaging shows infarct in the left occipital parietal cortex with watershed areas of the left frontal and parietal lobes. He also has a acute infarct of the left superior thalamus.  Clinical Impression   Pt admitted with above diagnosis. Pt currently with functional limitations due to the deficits listed below (see PT Problem List). 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       Follow Up Recommendations Home health PT    Equipment Recommendations  None recommended by PT    Recommendations for Other Services OT consult     Precautions / Restrictions Precautions Precautions: Fall      Mobility  Bed Mobility Overal bed mobility: Needs Assistance Bed Mobility: Supine to Sit     Supine to sit: Min assist     General bed mobility comments: Cues for initiating and technqiue; sat up to long sit and then moved feet off the bed; min assist to steady  Transfers Overall transfer level: Needs assistance Equipment used: Rolling walker (2 wheeled) Transfers: Sit to/from  Stand Sit to Stand: Min assist         General transfer comment: min assist to steady  Ambulation/Gait Ambulation/Gait assistance: Min assist Ambulation Distance (Feet): 120 Feet Assistive device: Rolling walker (2 wheeled) Gait Pattern/deviations: Step-through pattern;Decreased stride length;Trunk flexed     General Gait Details: Cues for RW proximity and to self-monitor for activity tolerance; tends to keep RW too far in front, min assist for rW management  Stairs            Wheelchair Mobility    Modified Rankin (Stroke Patients Only)       Balance     Sitting balance-Leahy Scale: Good       Standing balance-Leahy Scale: Poor                               Pertinent Vitals/Pain Pain Assessment: No/denies pain    Home Living Family/patient expects to be discharged to:: Private residence Living Arrangements: Spouse/significant other Available Help at Discharge: Family;Available 24 hours/day Type of Home: House Home Access: Stairs to enter Entrance Stairs-Rails: None Entrance Stairs-Number of Steps: 3 Home Layout: One level Home Equipment: Cane - single point;Walker - standard      Prior Function Level of Independence: Needs assistance   Gait / Transfers Assistance Needed: Pt amb without assistive device most of time. Family assist at times. Daughter reports pt tends to push RW too far out in front   ADL's / Homemaking Assistance Needed: occasional assistance        Hand Dominance  Dominant Hand: Right    Extremity/Trunk Assessment   Upper Extremity Assessment Upper Extremity Assessment: Defer to OT evaluation    Lower Extremity Assessment Lower Extremity Assessment: Generalized weakness       Communication   Communication: HOH;Prefers language other than English;Other (comment) (Family assists with communication)  Cognition Arousal/Alertness: Awake/alert Behavior During Therapy: WFL for tasks assessed/performed Overall  Cognitive Status: Within Functional Limits for tasks assessed                                        General Comments      Exercises     Assessment/Plan    PT Assessment Patient needs continued PT services  PT Problem List Decreased strength;Decreased activity tolerance;Decreased balance;Decreased mobility;Decreased knowledge of use of DME       PT Treatment Interventions DME instruction;Gait training;Functional mobility training;Stair training;Balance training;Therapeutic activities;Therapeutic exercise;Patient/family education    PT Goals (Current goals can be found in the Care Plan section)  Acute Rehab PT Goals Patient Stated Goal: not stated PT Goal Formulation: With family Time For Goal Achievement: 11/02/16 Potential to Achieve Goals: Good    Frequency Min 3X/week   Barriers to discharge        Co-evaluation               AM-PAC PT "6 Clicks" Daily Activity  Outcome Measure Difficulty turning over in bed (including adjusting bedclothes, sheets and blankets)?: None Difficulty moving from lying on back to sitting on the side of the bed? : A Little Difficulty sitting down on and standing up from a chair with arms (e.g., wheelchair, bedside commode, etc,.)?: A Lot Help needed moving to and from a bed to chair (including a wheelchair)?: A Little Help needed walking in hospital room?: A Little Help needed climbing 3-5 steps with a railing? : A Little 6 Click Score: 18    End of Session Equipment Utilized During Treatment: Gait belt Activity Tolerance: Patient tolerated treatment well Patient left: in chair;with call bell/phone within reach;with chair alarm set Nurse Communication: Mobility status PT Visit Diagnosis: Unsteadiness on feet (R26.81);Difficulty in walking, not elsewhere classified (R26.2);Muscle weakness (generalized) (M62.81)    Time: 4360-6770 PT Time Calculation (min) (ACUTE ONLY): 39 min   Charges:   PT Evaluation $PT  Eval Moderate Complexity: 1 Mod PT Treatments $Gait Training: 8-22 mins   PT G Codes:        Van Clines, PT  Acute Rehabilitation Services Pager [phone] Office [phone]   Levi Aland 10/19/2016, 4:25 PM

## 2016-10-19 NOTE — Progress Notes (Signed)
STROKE TEAM PROGRESS NOTE   HISTORY OF PRESENT ILLNESS (per record) [name] is a 83 y.o. 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 This morning, the patient woke up and appeared confused. He was not able to recognize his family members. His speech also seemed slurred. The family was concerned for a stroke and brought the patient to the emergency room. A code stroke was activated upon arrival. Patient arrived by personal vehicle and 911 was not called. The family member, who is also interpreting, denies any preceding fevers or chills. Denies any upper respiratory illnesses. Denies any urinary frequency or dysuria. The patient is not complaining of any headache or blurred vision or tingling or numbness prior to this presentation. He is a known patient of [facility]  LKW: Family poor historian, 11 AM on 10/17/2016 tpa given?: no, on Pradaxa Premorbid modified Rankin scale (mRS):  2-Slight disability-UNABLE to perform all activities but does not need assistance    SUBJECTIVE (INTERVAL HISTORY) His daughter and other family members are at the bedside.  His MRI showed left MCA moderate infarct as well as left thalamus and left high convexity ACA/PCA infarct. Daughter stated that pt compliant with ASA and pradaxa at home. Left ICA stent stable at 70%.    OBJECTIVE Temp:  [98.3 F (36.8 C)-98.9 F (37.2 C)] 98.9 F (37.2 C) (09/09 0931) Pulse Rate:  [67-91] 74 (09/09 0931) Cardiac Rhythm: Atrial fibrillation (09/09 0700) Resp:  [15-31] 20 (09/09 0931) BP: (104-189)/(49-105) 146/74  (09/09 0931) SpO2:  [95 %-100 %] 98 % (09/09 0931)  CBC:   Recent Labs Lab 10/18/16 0800 10/18/16 0810  WBC 7.6  --   NEUTROABS 5.5  --   HGB 10.4* 11.2*  HCT 30.9* 33.0*  MCV 94.2  --   PLT 209  --     Basic Metabolic Panel:   Recent Labs Lab 10/18/16 0800 10/18/16 0810  NA 130* 133*  K 4.4 4.5  CL 97* 96*  CO2 27  --   GLUCOSE 126* 120*  BUN 13 15  CREATININE 1.57* 1.60*  CALCIUM 9.1  --     Lipid Panel:     Component Value Date/Time   CHOL 83 10/19/2016 0423   CHOL 122 09/09/2013 0815   TRIG 64 10/19/2016 0423   HDL 25 (L) 10/19/2016 0423   HDL 35 (L) 09/09/2013 0815   CHOLHDL 3.3 10/19/2016 0423   VLDL 13 10/19/2016 0423   LDLCALC 45 10/19/2016 0423   LDLCALC 50 09/09/2013 0815   HgbA1c:  Lab Results  Component Value Date   HGBA1C 6.0 (H) 10/19/2016   Urine Drug Screen:     Component Value Date/Time   LABOPIA NONE DETECTED 10/08/2016 1937   COCAINSCRNUR NONE DETECTED 10/08/2016 1937   LABBENZ NONE DETECTED 10/08/2016 1937   AMPHETMU NONE DETECTED 10/08/2016 1937   THCU NONE DETECTED 10/08/2016 1937   LABBARB NONE DETECTED 10/08/2016 1937    Alcohol Level     Component Value Date/Time   ETH <5 10/09/2016 0100    IMAGING I have personally reviewed the radiological images below and agree with the radiology interpretations.  Ct Angio Head W Or Wo Contrast Ct Angio Neck W Or Wo Contrast Ct Cerebral Perfusion W Contrast 10/18/2016 IMPRESSION:  Carotid and vertebral arteries are patent in the neck with mild atherosclerotic disease.  CTA compared with the prior CTA of 07/18/2016. Left carotid stent is patent although the lumen is narrowed in the midportion of the stent. Minimal luminal diameter 2.4 mm compared with 5.2 mm in the proximal stent corresponds to 55 percent diameter stenosis. This is unchanged from the prior CTA. No intraluminal filling defect.  No emergent large vessel occlusion CT perfusion suggests small area of infarct in the  left occipital lobe. Bilateral delayed perfusion may be related to acute ischemia or severe intracranial small vessel atherosclerotic disease.   Mr Brain Wo Contrast 10/18/2016 IMPRESSION:  Interval development of acute infarct in the left occipital parietal cortex.  Watershed areas of acute infarct in the left frontal and parietal lobe.  Acute infarct left superior thalamus.  Subacute infarct posterior limb internal capsule on the right unchanged from recent MRI Atrophy and chronic ischemic changes.    Ct Head Code Stroke Wo Contrast` 10/18/2016 IMPRESSION:  1. No acute abnormality. Recent infarct posterior limb internal capsule  2. ASPECTS is 10  3. Extensive chronic ischemic change   Transthoracic Echocardiogram  - Left ventricle: The cavity size was normal. There was moderate   concentric hypertrophy. Systolic function was normal. The   estimated ejection fraction was in the range of 50% to 55%. Wall   motion was normal; there were no regional wall motion   abnormalities. - Aortic valve: Trileaflet; mildly thickened, mildly calcified   leaflets. There was trivial regurgitation. - Mitral valve: There was moderate regurgitation. Valve area by   continuity equation (using LVOT flow): 2.2 cm^2. - Left atrium: The atrium was mildly dilated. - Tricuspid valve: There was mild regurgitation. - Pulmonary arteries: Systolic pressure was mildly increased. PA   peak pressure: 34 mm Hg (S). Impressions: - When compared to prior, EF is mildly improved.    PHYSICAL EXAM  Vitals:   10/19/16 0400 10/19/16 0605 10/19/16 0858 10/19/16 0931  BP: (!) 104/59 132/67  (!) 146/74  Pulse: 72 78  74  Resp: Temp: 98.8 F (37.1 C) 98.8 F (37.1 C)  98.9 F (37.2 C)  TempSrc: Oral Oral  Oral  SpO2: 98% 98% 98% 98%  Weight:      Height:        Temp:  [98.4 F (36.9 C)-98.9 F (37.2 C)] 98.9 F (37.2 C) (09/09 1700) Pulse Rate:  [67-90] 79 (09/09 1700) Resp:  [16-20] 20 (09/09  1700) BP: (104-182)/(49-93) 150/77 (09/09 1700) SpO2:  [95 %-100 %] 100 % (09/09 1700)  General - Well nourished, well developed, in no apparent distress.  Ophthalmologic - Fundi not visualized due to noncooperation.  Cardiovascular - irregularly irregular heart rate and rhythm.  Mental Status -  Level of arousal and orientation to place, and person were intact, not orientated to time. Language exam - following all simple commands, difficulty with two-step commands. Paucity of speech, but able to speak short sentences, however, difficulty with naming and repetition.  Cranial Nerves II - XII - II - Visual field intact OU. III, IV, VI - Extraocular movements intact. V - Facial sensation intact bilaterally. VII - Facial movement intact bilaterally. VIII - Hearing & vestibular intact bilaterally. X - Palate elevates symmetrically. XI - Chin turning & shoulder shrug intact bilaterally. XII - Tongue protrusion intact.  Motor Strength - The patient's strength was symmetric in all extremities and pronator drift was absent.  Bulk was normal and fasciculations were absent.   Motor Tone - Muscle tone was assessed at the neck and appendages and was normal.  Reflexes - The patient's reflexes were 1+ in all extremities and he had no pathological reflexes.  Sensory - Light touch, temperature/pinprick were assessed and were symmetrical.    Coordination - The patient had normal movements in the hands with no ataxia or dysmetria.  Tremor was absent.  Gait and Station - deferred   ASSESSMENT/PLAN [name] is a 83 y.o. male with history of atrial fibrillation on Pradaxa, hypertension, coronary artery disease, congestive heart failure, previous left carotid artery stent, abdominal aortic aneurysm, and hard of hearing presenting with generalized weakness, slurred speech and confusion. He did not receive IV t-PA due to anticoagulation.  Strokes: left MCA infarct, left thalamic infarct  and left high convexity ACA/PCA border zone - embolic from left ICA stent 70% stenosis vs. atrial fibrillation. However, pt faithfully on pradaxa without missing doses.   Resultant  Mild aphasia   CT head -  Recent infarct posterior limb internal capsule   MRI head - acute infarct in the left occipital parietal cortex. Watershed areas of acute infarct in the left frontal and parietal lobe. Acute infarct left superior thalamus. Subacute infarct posterior limb internal capsule on the right   CTA H&N - Carotid and vertebral arteries are patent. Left carotid stent stenosis unchanged 70%. Left fetal PCA. Right M1 high grade stenosis  2D Echo - EF 50-55%, improved from 2 months ago.  Recommend to discuss with Dr. Allyson Sabal regarding left ICA stenosis or afib failed AC regimen  LDL - 45  HgbA1c - 6.0  VTE prophylaxis - Subcutaneous heparin Diet NPO time specified  aspirin 81 mg daily and Pradaxa (dabigatran) twice a day prior to admission, now on aspirin 325 mg daily. Due to moderate sized MCA infarct, hold off pradaxa for 3-5 days to avoid hemorrhagic conversion.   Patient counseled to be compliant with his antithrombotic medications  Ongoing aggressive stroke risk factor management  Therapy recommendations: pending  Disposition:  Pending  afib on predaxa  EKG continued to show afib  Rate controlled  Hold off predaxa for 3-5 days to avoid hemorrhagic conversion  Pt faithfully taking pradaxa PTA  Recommend to discuss with Dr. Allyson Sabal if need any change of AC regimen  Carotid stenosis s/p stent  S/p left CAS with Dr. Allyson Sabal  02/2016 CUS - no significant stenosis  07/2016 CTA neck - left ICA stent 70% stenosis  10/2016 CTA neck - left CIA stent stenosis unchanged  Recommend to discuss with Dr. Allyson Sabal if need any balloon angioplasty of left ICA stent  Hypertension  Stable  Permissive hypertension (OK if < 220/120) but gradually normalize in 5-7 days  Long-term BP goal  normotensive  Hyperlipidemia  Home meds:  Lipitor 40 mg daily resumed in hospital  LDL 45, goal < 70  Continue statin at discharge  Other Stroke Risk Factors  Advanced age  Former cigarette smoker, quit 2004  CAD  Hx of stroke - 10/09/16 acute right PLIC infarct - small vessel disease - right M1 75% stenosis - EF 45-50% - LDL 50 and A1C 6.4 - continued on pradaxa and ASA - continue to follow with Dr. Pearlean Brownie at [facility]  Other Active Problems  Moderate CKD, Stage 3B, GFR 42  AAA  Hyponatremia - 133  Hospital day #  1  Marvel Plan, MD PhD Stroke Neurology 10/19/2016 5:55 PM    To contact Stroke Continuity provider, please refer to [email or web address]. After hours, contact General Neurology

## 2016-10-19 NOTE — Progress Notes (Signed)
  Echocardiogram 2D Echocardiogram has been performed.  [name] 10/19/2016, 10:37 AM

## 2016-10-19 NOTE — Evaluation (Signed)
Clinical/Bedside Swallow Evaluation Patient Details  Name: [name] MRN: [id number] Date of Birth: [birth year]  Today's Date: 10/19/2016 Time: SLP Start Time (ACUTE ONLY): 1230 SLP Stop Time (ACUTE ONLY): 1250 SLP Time Calculation (min) (ACUTE ONLY): 20 min  Past Medical History:  Past Medical History:  Diagnosis Date  . AAA (abdominal aortic aneurysm) (HCC)   . Anemia   . Asthma   . Carotid artery disease (HCC)   . CHF (congestive heart failure) (HCC)   . Coronary artery disease   . Depression   . Gout   . H. pylori infection   . Hard of hearing   . Hiatal hernia   . Hyperplasia, prostate   . Hypertension   . PUD (peptic ulcer disease)   . Renal failure, acute (HCC) 11/08/2012   Past Surgical History:  Past Surgical History:  Procedure Laterality Date  . CAROTID STENT INSERTION  2015  . CAROTID STENT INSERTION N/A 05/09/2013   Procedure: CAROTID STENT INSERTION;  Surgeon: Runell Gess, MD;  Location[facility] C[facility] LAB;  Service: Cardiovascular;  Laterality: N/A;  . ESOPHAGOGASTRODUODENOSCOPY  06/13/2008,12/31/12  . LEFT HEART CATHETERIZATION WITH CORONARY ANGIOGRAM N/A 05/04/2013   Procedure: LEFT HEART CATHETERIZATION WITH CORONARY ANGIOGRAM;  Surgeon: Peter M Swaziland, MD;  Location: [facility] CATH LAB;  Service: Cardiovascular;  Laterality: N/A;   HPI:  Macdonald Khamjumphonis a 83 y.o.malewith a history of atrial fibrillation with chads 2 vascular score of 6, CAD, history of multiple strokes on anticoagulation, with his most recent stroke at the end of August 2018 while on Pradaxa. History is obtained by family members as patient is poor historian. Patient was last seen normal yesterday morning. The patient became gradually more weak.  This morning, the patient woke up and appeared more confused, unable to recognize family members, slurred speech, weakness on his right side, preference of leftward gaze. Patient was brought to the emergency department by private vehicle. He was not  considered a TPA candidate due to being on Pradaxa. Since being in the emergency department, his symptoms have been improving.  MRI is showing left occipital CVA with watershed area of left frontal and parietal lobes as well as left superior thalamus.     Assessment / Plan / Recommendation Clinical Impression  Clinical swallowing evaluation was completed using thin liquids, pureed material and dual textured solids in setting of new CVA.  The patient is well known to ST service from previous admissions with most recent clinical swallowing evaluation being completed 07/18/2016 with recommendation for a dysphagia 2 diet with thin liquids.  Limited oral mechanism exam was completed due to the patient's difficulty following commands.  Good lingual and labial range of motion were observed.  Strength was unable to be assessed.  Suspect weakness due to intermittent anterior escape of liquids.  Given PO's the patient was observed to have an oropharyngeal dysphagia.  The oral phase was characterized by delayed oral transit that was most likely impacted by his lack of dentition and mild oral residue.  The pharyngeal phase was characterized by immediate cough given serial sips of thin liquids via straw sips.  This was not replicated given self fed cup sips of thin liquids.  Swallow trigger appeared to be timely and hyo-laryngeal excursion was appreciated to palpation.  Recommend a dysphagia 2 diet with thin liquids.  NO STRAWS.  ST to follow up for therapeutic diet tolerance. SLP Visit Diagnosis: Dysphagia, oropharyngeal phase (R13.12)    Aspiration Risk  Mild aspiration risk  Diet Recommendation  Dysphagia 2 with thin liquids.  NO STRAWS!   Medication Administration: Crushed with puree    Other  Recommendations Oral Care Recommendations: Oral care BID   Follow up Recommendations Other (comment) (TBD)      Frequency and Duration min 2x/week  2 weeks       Prognosis Prognosis for Safe Diet Advancement:  Fair      Swallow Study   General Date of Onset: 10/18/16 HPI: Keymari Khamjumphonis a 83 y.o.malewith a history of atrial fibrillation with chads 2 vascular score of 6, CAD, history of multiple strokes on anticoagulation, with his most recent stroke at the end of August 2018 while on Pradaxa. History is obtained by family members as patient is poor historian. Patient was last seen normal yesterday morning. The patient became gradually more weak.  This morning, the patient woke up and appeared more confused, unable to recognize family members, slurred speech, weakness on his right side, preference of leftward gaze. Patient was brought to the emergency department by private vehicle. He was not considered a TPA candidate due to being on Pradaxa. Since being in the emergency department, his symptoms have been improving.  MRI is showing left occipital CVA with watershed area of left frontal and parietal lobes as well as left superior thalamus.   Type of Study: Bedside Swallow Evaluation Previous Swallow Assessment: 05/02/13 Rx Dysphagia 2/thin.  11/18/2013 Rx Regular/thin  07/18/2016 Rx Dysphagia 2/thin Diet Prior to this Study: NPO Temperature Spikes Noted: No Respiratory Status: Room air History of Recent Intubation: No Behavior/Cognition: Alert;Cooperative;Requires cueing Oral Cavity Assessment: Within Functional Limits Oral Care Completed by SLP: No Oral Cavity - Dentition: Edentulous Vision: Functional for self-feeding Self-Feeding Abilities: Needs assist Patient Positioning: Upright in bed Baseline Vocal Quality: Normal;Low vocal intensity Volitional Cough: Weak Volitional Swallow: Able to elicit    Oral/Motor/Sensory Function Overall Oral Motor/Sensory Function:  (Limited Assessment)   Ice Chips Ice chips: Not tested   Thin Liquid Thin Liquid: Impaired Presentation: Cup;Spoon;Straw Oral Phase Impairments: Reduced labial seal Oral Phase Functional Implications: Right anterior  spillage;Left anterior spillage Pharyngeal  Phase Impairments: Cough - Immediate (given serial straw sips)    Nectar Thick Nectar Thick Liquid: Not tested   Honey Thick Honey Thick Liquid: Not tested   Puree Puree: Within functional limits Presentation: Spoon   Solid   GO   Solid: Impaired Presentation: Spoon Oral Phase Impairments: Impaired mastication Oral Phase Functional Implications: Prolonged oral transit;Oral residue       Dimas Aguas, MA, CCC-SLP Acute Rehab SLP [phone] Fleet Contras 10/19/2016,1:03 PM

## 2016-10-20 ENCOUNTER — Inpatient Hospital Stay (HOSPITAL_COMMUNITY): Payer: Medicare Other

## 2016-10-20 DIAGNOSIS — I6522 Occlusion and stenosis of left carotid artery: Secondary | ICD-10-CM

## 2016-10-20 LAB — BASIC METABOLIC PANEL
ANION GAP: 6 (ref 5–15)
BUN: 11 mg/dL (ref 6–20)
CALCIUM: 8.6 mg/dL — AB (ref 8.9–10.3)
CO2: 20 mmol/L — ABNORMAL LOW (ref 22–32)
Chloride: 107 mmol/L (ref 101–111)
Creatinine, Ser: 1.46 mg/dL — ABNORMAL HIGH (ref 0.61–1.24)
GFR, EST AFRICAN AMERICAN: 48 mL/min — AB (ref >60)
GFR, EST NON AFRICAN AMERICAN: 41 mL/min — AB (ref >60)
Glucose, Bld: 95 mg/dL (ref 65–99)
POTASSIUM: 3.8 mmol/L (ref 3.5–5.1)
SODIUM: 133 mmol/L — AB (ref 135–145)

## 2016-10-20 LAB — CBC
HCT: 27.9 % — ABNORMAL LOW (ref 39.0–52.0)
Hemoglobin: 9.2 g/dL — ABNORMAL LOW (ref 13.0–17.0)
MCH: 31.1 pg (ref 26.0–34.0)
MCHC: 33 g/dL (ref 30.0–36.0)
MCV: 94.3 fL (ref 78.0–100.0)
PLATELETS: 176 10*3/uL (ref 150–400)
RBC: 2.96 MIL/uL — AB (ref 4.22–5.81)
RDW: 15 % (ref 11.5–15.5)
WBC: 6.6 10*3/uL (ref 4.0–10.5)

## 2016-10-20 LAB — TROPONIN I
Troponin I: 0.05 ng/mL (ref <0.03)
Troponin I: 0.05 ng/mL (ref <0.03)
Troponin I: 0.04 ng/mL (ref <0.03)
Troponin I: 0.04 ng/mL (ref <0.03)

## 2016-10-20 MED ORDER — DABIGATRAN ETEXILATE MESYLATE 75 MG PO CAPS: 75.0000 mg | ORAL_CAPSULE | Freq: Two times a day (BID) | ORAL | 0 refills | Status: DC

## 2016-10-20 NOTE — Progress Notes (Signed)
STROKE TEAM PROGRESS NOTE   SUBJECTIVE (INTERVAL HISTORY) His daughter and other family members are at the bedside.  Pt d/c home today with home PT. Continue Dysphagia Diet 2 at home.  Pt to follow up outpt with Dr. Allyson Sabal, cardiology, 10/23 and Dr. Pearlean Brownie 10/22.   OBJECTIVE Temp:  [98 F (36.7 C)-99.6 F (37.6 C)] 99.6 F (37.6 C) (09/10 1347) Pulse Rate:  [71-90] 78 (09/10 1347) Cardiac Rhythm: Atrial fibrillation (09/10 0700) Resp:  [16-22] 16 (09/10 1347) BP: (136-158)/(69-87) 137/69 (09/10 1347) SpO2:  [97 %-100 %] 100 % (09/10 1347)  CBC:   Recent Labs Lab 10/18/16 0800 10/18/16 0810 10/20/16 0422  WBC 7.6  --  6.6  NEUTROABS 5.5  --   --   HGB 10.4* 11.2* 9.2*  HCT 30.9* 33.0* 27.9*  MCV 94.2  --  94.3  PLT 209  --  176    Basic Metabolic Panel:   Recent Labs Lab 10/18/16 0800 10/18/16 0810 10/20/16 0422  NA 130* 133* 133*  K 4.4 4.5 3.8  CL 97* 96* 107  CO2 27  --  20*  GLUCOSE 126* 120* 95  BUN CREATININE 1.57* 1.60* 1.46*  CALCIUM 9.1  --  8.6*    Lipid Panel:     Component Value Date/Time   CHOL 83 10/19/2016 0423   CHOL 122 09/09/2013 0815   TRIG 64 10/19/2016 0423   HDL 25 (L) 10/19/2016 0423   HDL 35 (L) 09/09/2013 0815   CHOLHDL 3.3 10/19/2016 0423   VLDL 13 10/19/2016 0423   LDLCALC 45 10/19/2016 0423   LDLCALC 50 09/09/2013 0815   HgbA1c:  Lab Results  Component Value Date   HGBA1C 6.0 (H) 10/19/2016   Urine Drug Screen:     Component Value Date/Time   LABOPIA NONE DETECTED 10/08/2016 1937   COCAINSCRNUR NONE DETECTED 10/08/2016 1937   LABBENZ NONE DETECTED 10/08/2016 1937   AMPHETMU NONE DETECTED 10/08/2016 1937   THCU NONE DETECTED 10/08/2016 1937   LABBARB NONE DETECTED 10/08/2016 1937    Alcohol Level     Component Value Date/Time   ETH <5 10/09/2016 0100    IMAGING I have personally reviewed the radiological images below and agree with the radiology interpretations.  Ct Angio Head W Or Wo Contrast Ct  Angio Neck W Or Wo Contrast Ct Cerebral Perfusion W Contrast 10/18/2016 IMPRESSION:  Carotid and vertebral arteries are patent in the neck with mild atherosclerotic disease.  CTA compared with the prior CTA of 07/18/2016. Left carotid stent is patent although the lumen is narrowed in the midportion of the stent. Minimal luminal diameter 2.4 mm compared with 5.2 mm in the proximal stent corresponds to 55 percent diameter stenosis. This is unchanged from the prior CTA. No intraluminal filling defect.  No emergent large vessel occlusion CT perfusion suggests small area of infarct in the left occipital lobe. Bilateral delayed perfusion may be related to acute ischemia or severe intracranial small vessel atherosclerotic disease.   Mr Brain Wo Contrast 10/18/2016 IMPRESSION:  Interval development of acute infarct in the left occipital parietal cortex.  Watershed areas of acute infarct in the left frontal and parietal lobe.  Acute infarct left superior thalamus.  Subacute infarct posterior limb internal capsule on the right unchanged from recent MRI Atrophy and chronic ischemic changes.    Ct Head Code Stroke Wo Contrast 10/18/2016 IMPRESSION:  1. No acute abnormality. Recent infarct posterior limb internal capsule  2. ASPECTS is 10  3.  Extensive chronic ischemic change   TTE  - Left ventricle: The cavity size was normal. There was moderate   concentric hypertrophy. Systolic function was normal. The   estimated ejection fraction was in the range of 50% to 55%. Wall   motion was normal; there were no regional wall motion   abnormalities. - Aortic valve: Trileaflet; mildly thickened, mildly calcified   leaflets. There was trivial regurgitation. - Mitral valve: There was moderate regurgitation. Valve area by   continuity equation (using LVOT flow): 2.2 cm^2. - Left atrium: The atrium was mildly dilated. - Tricuspid valve: There was mild regurgitation. - Pulmonary arteries: Systolic pressure  was mildly increased. PA   peak pressure: 34 mm Hg (S). Impressions: - When compared to prior, EF is mildly improved.   PHYSICAL EXAM  Vitals:   10/20/16 0946 10/20/16 1003 10/20/16 1007 10/20/16 1347  BP: 136/84   137/69  Pulse: 71   78  Resp: 16   16  Temp: 98.7 F (37.1 C)   99.6 F (37.6 C)  TempSrc: Oral   Oral  SpO2: 99% 97% 97% 100%  Weight:      Height:        Temp:  [98 F (36.7 C)-99.6 F (37.6 C)] 99.6 F (37.6 C) (09/10 1347) Pulse Rate:  [71-90] 78 (09/10 1347) Resp:  [16-22] 16 (09/10 1347) BP: (136-158)/(69-87) 137/69 (09/10 1347) SpO2:  [97 %-100 %] 100 % (09/10 1347)  General - Well nourished, well developed, in no apparent distress.  Ophthalmologic - Fundi not visualized due to noncooperation.  Cardiovascular - irregularly irregular heart rate and rhythm.  Mental Status -  Level of arousal and orientation to place, and person were intact, not orientated to time. Language exam - following all simple commands, difficulty with two-step commands. Paucity of speech, but able to speak short sentences, however, difficulty with naming and repetition.  Cranial Nerves II - XII - II - Visual field intact OU. III, IV, VI - Extraocular movements intact. V - Facial sensation intact bilaterally. VII - Facial movement intact bilaterally. VIII - Hearing & vestibular intact bilaterally. X - Palate elevates symmetrically. XI - Chin turning & shoulder shrug intact bilaterally. XII - Tongue protrusion intact.  Motor Strength - The patient's strength was symmetric in all extremities and pronator drift was absent.  Bulk was normal and fasciculations were absent.   Motor Tone - Muscle tone was assessed at the neck and appendages and was normal.  Reflexes - The patient's reflexes were 1+ in all extremities and he had no pathological reflexes.  Sensory - Light touch, temperature/pinprick were assessed and were symmetrical.    Coordination - The patient had normal  movements in the hands with no ataxia or dysmetria.  Tremor was absent.  Gait and Station - deferred   ASSESSMENT/PLAN [name] is a 83 y.o. male with history of atrial fibrillation on Pradaxa, hypertension, coronary artery disease, congestive heart failure, previous left carotid artery stent, abdominal aortic aneurysm, and hard of hearing presenting with generalized weakness, slurred speech and confusion. He did not receive IV t-PA due to anticoagulation.  Strokes: left MCA infarct, left thalamic infarct and left high convexity ACA/PCA border zone - embolic from left ICA stent 70% re-stenosis vs. atrial fibrillation. However, pt faithfully on pradaxa without missing doses.   Resultant  Mild aphasia   CT head -  Recent infarct posterior limb internal capsule   MRI head - acute infarct in the left occipital parietal cortex. Watershed  areas of acute infarct in the left frontal and parietal lobe. Acute infarct left superior thalamus. Subacute infarct posterior limb internal capsule on the right   CTA H&N - Carotid and vertebral arteries are patent. Left carotid stent stenosis unchanged 70%. Left fetal PCA. Right M1 high grade stenosis  2D Echo - EF 50-55%, improved from 2 months ago.  Recommend to follow with Dr. Allyson Sabal and Dr. Pearlean Brownie regarding left ICA re-stenosis or afib failed AC regimen  LDL - 45  HgbA1c - 6.0  VTE prophylaxis - Subcutaneous heparin DIET DYS 2 Room service appropriate? Yes; Fluid consistency: Thin  aspirin 81 mg daily and Pradaxa (dabigatran) twice a day prior to admission, now on aspirin 325 mg daily. Due to moderate sized MCA infarct, hold off pradaxa for 3-5 days to avoid hemorrhagic conversion.   Patient counseled to be compliant with his antithrombotic medications  Ongoing aggressive stroke risk factor management  Therapy recommendations: home health PT  Disposition:  Discharge to home  Hx of stroke    10/09/16 acute right PLIC infarct -  small vessel disease - right M1 75% stenosis - EF 45-50% - LDL 50 and A1C 6.4 - continued on pradaxa and ASA  07/2016 - right MCA infarct - right M1 75% stenosis, left ICA stent 70% stenosis - EF 45-50% - LDL 42 and A1C 6.4 - Xarelto changed to pradaxa  04/2013 - multifocal cardioembolic infarcts - new diagnosed afib - EF 35% - put on eliquis 2.5mg  bid in 08/2013 - however, pt was on Xarelto 10/2013 as per chart  Carotid stenosis s/p stent  S/p left CAS with Dr. Allyson Sabal  02/2016 CUS - no significant stenosis  07/2016 CTA neck - left ICA stent 70% stenosis  10/2016 CTA neck - left CIA stent stenosis unchanged  Follow up with Dr. Allyson Sabal and Dr. Pearlean Brownie to see if need any balloon angioplasty of left ICA stent  afib on predaxa  EKG continued to show afib  Rate controlled  Was on eliquis briefly in 2015, then switched to Xarelto for unclear reason, changed to Pradaxa in 07/2016 after stroke  Hold off pradaxa for 3-5 days to avoid hemorrhagic conversion  Pt faithfully taking pradaxa PTA  Follow up with Dr. Allyson Sabal to discuss [facility] regimen change if needed  Hypertension  Stable Permissive hypertension (OK if < 220/120) but gradually normalize in 5-7 days Long-term BP goal normotensive  Hyperlipidemia  Home meds:  Lipitor 40 mg daily resumed in hospital  LDL 45, goal < 70  Continue statin at discharge  Other Stroke Risk Factors  Advanced age  Former cigarette smoker, quit 2004  Severe CAD - followed with Dr. Allyson Sabal  Other Active Problems  Moderate CKD, Stage 3B, GFR 42  AAA  Hyponatremia - 133  Hospital day # 2  Neurology will sign off. Please call with questions. Pt will follow up with Dr. Pearlean Brownie at [facility] on 12/01/16. Thanks for the consult.   Marvel Plan, MD PhD Stroke Neurology 10/20/2016 1:56 PM    To contact Stroke Continuity provider, please refer to [email or web address]. After hours, contact General Neurology

## 2016-10-20 NOTE — Care Management Note (Signed)
Case Management Note  Patient Details  Name: [name] MRN: [id number] Date of Birth: 03/02/1926  Subjective/Objective:   Pt admitted with CVA. He is from home with his family.                   Action/Plan: Pt discharging home with HH services. Pt was active with [facility] prior to admission. CM met with the patient, his wife, and daughter and provided a list of HH agencies. They asked to continue with [facility]. Jermaine with [facility] notified of the resumption of care.  Family to provide transportation home.   Expected Discharge Date:  10/20/16               Expected Discharge Plan:  Home w Home Health Services  In-House Referral:     Discharge planning Services  CM Consult  Post Acute Care Choice:  Resumption of Svcs/PTA Provider Choice offered to:     DME Arranged:    DME Agency:     HH Arranged:  RN, PT, OT, Nurse's Aide, Speech Therapy HH Agency:  [facility]  Status of Service:  Completed, signed off  If discussed at Long Length of Stay Meetings, dates discussed:    Additional Comments:   F , RN 10/20/2016, 3:41 PM  

## 2016-10-20 NOTE — Progress Notes (Signed)
[name] called RN to room. Pt c/o pain in chest and arm, with SOB. VS stable. 10 lead EKG obtained. MD notified. New order: chest x ray, troponin, d/c NS IV. Pain subsided after chest x ray. Pt had no further complaints through the shift.

## 2016-10-20 NOTE — Discharge Summary (Signed)
Physician Discharge Summary  [name] ZOX:[id number] DOB: 1926-10-22 DOA: 10/18/2016  PCP: [name], MD  Admit date: 10/18/2016 Discharge date: 10/20/2016  Time spent: 45 minutes  Recommendations for Outpatient Follow-up:  Patient will be discharged to home with home health physical therapy.  Patient will need to follow up with primary care provider within one week of discharge. Follow up with Dr. Allyson Sabal, cardiology- on 12/02/2016 at 10:30am.  Patient should continue medications as prescribed.  Patient should follow a dysphagia 2 diet.   Discharge Diagnoses:  Acute CVA Essential hypertension Chronic systolic heart failure Paroxysmal atrial fibrillation on chronic anticoagulation Chronic kidney disease, stage III  Goals of care  Discharge Condition: Stable  Diet recommendation: dysphagia 2  Filed Weights   10/18/16 0751  Weight: 63.5 kg (140 lb)    History of present illness:  on 10/18/2016 by Dr. Beryl Meager Khamjumphonis an 83 y.o.malewith a history of atrial fibrillation with chads 2 vascular score of 6, CAD, history of multiple strokes on anticoagulation, with his most recent stroke at the end of August 2018 while on Pradaxa. History is obtained by family members as patient is poor historian. Patient was last seen normal yesterday morning. The patient became gradually more weak and was unable to the pertussis. And his physical therapy. This morning, the patient woke up and appeared more confused, unable to recognize family members, slurred speech, weakness on his right side, preference of leftward gaze. Patient was brought to the emergency department by private vehicle. He was not considered a TPA candidate due to being on Pradaxa. Since being in the emergency department, his symptoms have been improving.  The patient was recently on Xarelto for anticoagulation until 07/2016, when he was switched to Pradaxa due to treatment failure. He has never been on  Coumadin or Lovenox/heparin  Hospital Course:  Acute CVA -Patient was on Pradaxa for prior CVAs- currently renally adjusted -CT head: No acute abnormality. Recent infarct posterior limb internal capsule. -MRI brain: Interval development of acute infarct of the left occipital parietal cortex. Watershed areas of acute infarct in the left frontal and parietal lobe. Acute infarct left superior thalamus. Subacute infarct posterior limb internal capsule on the right (unchanged from prior MRI) -CT cerebral perfusion: Carotid vertebral arteries are patent in the neck with mild atherosclerotic disease. Small area of infarct in the left occipital lobe. Addendum: Left carotid stent is patent although lumen in narrowed in the midportion of the stent, 55% stenosis.  -Echocardiogram EF 50-55%, no RWMA -Hemoglobin A1c 6, HDL 45 -PT, OT consulted  -Speech consulted, recommended dysphagia 2 diet with thin liquids, no straws. -Neurology consulted and appreciated- recommended discuss with Dr. Allyson Sabal and wait to restart Pradaxa until 9/11 given size of stroke and bleeding risk -? Should patient be switched to Coumadin or Lovenox -Continue aspirin, statin -Upon review of EPIC, patient has been on Eliquis, Xarelto, and Pradaxa -Discussed with Dr. Allyson Sabal and Dr. Roda Shutters, unfortunately, would not try lovenox or coumadin at this time or  -Patient will need to follow up with Dr. Allyson Sabal and Dr. Pearlean Brownie  Essential hypertension -Continue coreg  Chronic systolic heart failure -Echocardiogram 07/18/2016 showed EF of 45-50%, hypokinesis of entire inferior lateral myocardium, basal inferior myocardium -Currently appears to be euvolemic and compensated -Monitor intake and output, daily weights -Repeat echo as above  -Continue coreg, statin  Paroxysmal atrial fibrillation on chronic anticoagulation -was on pradaxa -pending further recommendations from neurology regarding anticoagulation  Chronic kidney disease, stage III    -  Creatinine appears to be at baseline, continue to monitor BMP  Goals of care -Discussed CODE STATUS patient's son. Patient is non-English-speaking, however son refused the use of an interpreter. -Patient's son feels patient should be DO NOT RESUSCITATE but would like to confirm and discussed with his sister. -Discussed with daughter at bedside, she has a DNR signed   Procedures: Echocardiogram  Consultations: Neurology Cardiology, Dr. Allyson Sabal, via phone  Discharge Exam: Vitals:   10/20/16 1003 10/20/16 1007  BP:    Pulse:    Resp:    Temp:    SpO2: 97% 97%    General: Well developed, well nourished, NAD, appears stated age  HEENT: NCAT, mucous membranes moist.  Cardiovascular: S1 S2 auscultated, irregular  Respiratory: Clear to auscultation bilaterally  Abdomen: Soft, nontender, nondistended, + bowel sounds  Extremities: warm dry without cyanosis clubbing or edema  Neuro: awake and alert. Difficult to assess given language barrier   Psych: Appropriate  Discharge Instructions Discharge Instructions    Discharge instructions    Complete by:  As directed    Patient will be discharged to home with home health physical therapy.  Patient will need to follow up with primary care provider within one week of discharge. Follow up with Dr. Allyson Sabal, cardiology- on 12/02/2016 at 10:30am.  Patient should continue medications as prescribed.  Patient should follow a dysphagia 2 diet.  Restart pradaxa on 10/21/2016.     Current Discharge Medication List    CONTINUE these medications which have CHANGED   Details  dabigatran (PRADAXA) 75 MG CAPS capsule Take 1 capsule (75 mg total) by mouth every 12 (twelve) hours. Qty: 60 capsule, Refills: 0      CONTINUE these medications which have NOT CHANGED   Details  allopurinol (ZYLOPRIM) 300 MG tablet Take 300 mg by mouth daily. To prevent gout    arformoterol (BROVANA) 15 MCG/2ML NEBU Take 2 mLs (15 mcg total) by nebulization 2  (two) times daily. Qty: 120 mL, Refills: 2    aspirin EC 81 MG tablet Take 1 tablet (81 mg total) by mouth daily. 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 Qty: 30 tablet, Refills: 5    budesonide (PULMICORT) 0.5 MG/2ML nebulizer solution Take 2 mLs (0.5 mg total) by nebulization 2 (two) times daily. Qty: 2 mL, Refills: 12    carvedilol (COREG) 6.25 MG tablet Take 1 tablet (6.25 mg total) by mouth 2 (two) times daily with a meal. Qty: 60 tablet, Refills: 6    esomeprazole (NEXIUM) 40 MG capsule Take 1 capsule (40 mg total) by mouth 2 (two) times daily before a meal.    ipratropium-albuterol (DUONEB) 0.5-2.5 (3) MG/3ML SOLN Take 3 mLs by nebulization 2 (two) times daily as needed (for shortness of breath or wheezing).    isosorbide mononitrate (IMDUR) 30 MG 24 hr tablet Take 30 mg by mouth once daily Qty: 15 tablet, Refills: 9    meclizine (ANTIVERT) 25 MG tablet Take 25 mg by mouth 3 (three) times daily as needed for dizziness.     NITROSTAT 0.4 MG SL tablet PLACE 1 TABLET UNDER THE TONGUE EVERY 5 MINUTES FOR 3 DOSES AS NEEDED FOR CHEST PAIN Qty: 25 tablet, Refills: 5    tamsulosin (FLOMAX) 0.4 MG CAPS capsule Take 0.4 mg by mouth daily. To improve  bladder function       Allergies  Allergen Reactions  . Levofloxacin Anaphylaxis and Other (See Comments)    Patient told his daughter that it made him "feel worse than he already did" (overall) Headache also (has refused to take anymore)  . Eliquis [Apixaban] Itching and Swelling  . Phenazopyridine Hcl Other (See Comments)    Unknown reaction  . Septra [Sulfamethoxazole-Trimethoprim] Nausea And Vomiting and Other (See Comments)    GI Upset  . Latex Rash  . Tape Rash   Follow-up Information    Runell Gess, MD. Go on 12/02/2016.    Specialties:  Cardiology, Radiology Why:  At 10:30am.  You will also be placed on a cancellation list for the possibility of a sooner appointment. Contact information: [address] [place] [address] [phone]        Micki Riley, MD. Schedule an appointment as soon as possible for a visit in 6 week(s).   Specialties:  Neurology, Radiology Why:  Hospital follow up, stroke clinic Contact information: [address] [place] [address] [phone]        [name], MD. Schedule an appointment as soon as possible for a visit in 1 week(s).   Specialty:  Family Medicine Why:  Hospital follow up  Contact information: [address] [place] [address] [phone]            The results of significant diagnostics from this hospitalization (including imaging, microbiology, ancillary and laboratory) are listed below for reference.    Significant Diagnostic Studies: Ct Angio Head W Or Wo Contrast  Addendum Date: 10/19/2016   ADDENDUM REPORT: 10/19/2016 16:00 ADDENDUM: CTA compared with the prior CTA of 07/18/2016. Left carotid stent is patent although the lumen is narrowed in the midportion of the stent. Minimal luminal diameter 2.4 mm compared with 5.2 mm in the proximal stent corresponds to 55 percent diameter stenosis. This is unchanged from the prior CTA. No intraluminal filling defect. Electronically Signed   By: Marlan Palau M.D.   On: 10/19/2016 16:00   Result Date: 10/19/2016 CLINICAL DATA:  Code stroke.  Recent stroke with neuro changes. EXAM: CT ANGIOGRAPHY HEAD AND NECK CT PERFUSION BRAIN TECHNIQUE: Multidetector CT imaging of the head and neck was performed using the standard protocol during bolus administration of intravenous contrast. Multiplanar CT image reconstructions and MIPs were obtained to evaluate the vascular anatomy. Carotid stenosis measurements (when applicable) are obtained utilizing NASCET criteria, using  the distal internal carotid diameter as the denominator. Multiphase CT imaging of the brain was performed following IV bolus contrast injection. Subsequent parametric perfusion maps were calculated using RAPID software. CONTRAST:  100 mL Isovue 370 IV COMPARISON:  CT head 10/18/2016, MRI 10/09/2016 FINDINGS: CTA NECK FINDINGS Aortic arch: Advanced atherosclerotic disease in the aortic arch. Proximal great vessels patent with mild atherosclerotic disease Right carotid system: Mild atherosclerotic disease of the right carotid bifurcation without significant stenosis Left carotid system: Stent across the left carotid bifurcation extending into the left internal carotid artery. Stent is widely patent. Moderate stenosis origin of the external carotid artery. Vertebral arteries: Both vertebral arteries patent to the basilar without significant stenosis. Mild atherosclerotic disease distal left vertebral artery. Skeleton: Cervical degenerative change. No acute skeletal abnormality. Other neck: Negative Upper chest: Negative Review of the MIP images confirms the above findings CTA HEAD FINDINGS Anterior circulation: Atherosclerotic calcification right cavernous carotid without significant stenosis. Moderate stenosis right M1/M2. Right anterior cerebral artery patent.  Atherosclerotic calcification left cavernous carotid without significant stenosis. Left anterior and middle cerebral artery patent without significant stenosis Posterior circulation: Both vertebral arteries contribute to the basilar. Mild stenosis distal left vertebral artery. Left vertebral artery dominant. Right PICA patent. Left PICA appears supplied from the left AICA. Basilar widely patent. Fetal origin of the posterior cerebral artery on the left. Both posterior cerebral arteries widely patent without stenosis. Venous sinuses: Patent Anatomic variants: None Delayed phase: Not performed Review of the MIP images confirms the above findings CT Brain  Perfusion Findings: CBF (<30%) Volume: 8 mLmL in the left occipital lobe Perfusion (Tmax>6.0s) volume: 97 mLmL. Delayed perfusion is present in the left occipital lobe as well as in the left watershed territory. Delayed perfusion also noted in the right temporal and occipital lobe. Mismatch Volume: 89mL Infarction Location:Left occipital lobe small infarct. Bilateral delayed perfusion IMPRESSION: Carotid and vertebral arteries are patent in the neck with mild atherosclerotic disease. Left carotid stent widely patent. No emergent large vessel occlusion CT perfusion suggests small area of infarct in the left occipital lobe. Bilateral delayed perfusion may be related to acute ischemia or severe intracranial small vessel atherosclerotic disease. These results were called by telephone at the time of interpretation on 10/18/2016 at 9:15 am to Dr. Wilford Corner, who verbally acknowledged these results. Electronically Signed: By: Marlan Palau M.D. On: 10/18/2016 09:16   Dg Chest 2 View  Result Date: 10/20/2016 CLINICAL DATA:  Chest pain and shortness of breath tonight. History of CHF. EXAM: CHEST  2 VIEW COMPARISON:  Chest radiograph October 10, 2016 FINDINGS: Cardiac silhouette is moderately enlarged and unchanged. Calcified aortic knob. Chronic interstitial changes and hyperinflation without pleural effusion or focal consolidation. LEFT upper lung zone scarring. No pneumothorax. Soft tissue planes included osseous structures are nonsuspicious. IMPRESSION: Stable cardiomegaly. Similar hyperinflation and chronic interstitial changes. Aortic Atherosclerosis (ICD10-I70.0). Electronically Signed   By: Awilda Metro M.D.   On: 10/20/2016 05:48   Dg Chest 2 View  Result Date: 10/08/2016 CLINICAL DATA:  Cough altered mental status EXAM: CHEST  2 VIEW COMPARISON:  10/27/2015 FINDINGS: Slightly coarse interstitial pattern likely chronic. No acute consolidation or effusion. Resolution of left mid lung opacity since prior  radiograph. Stable cardiomediastinal silhouette with atherosclerosis. Negative for pneumothorax. Degenerative changes of the spine. IMPRESSION: No active cardiopulmonary disease. Electronically Signed   By: Jasmine Pang M.D.   On: 10/08/2016 20:12   Ct Head Wo Contrast  Result Date: 10/08/2016 CLINICAL DATA:  Altered mental status EXAM: CT HEAD WITHOUT CONTRAST TECHNIQUE: Contiguous axial images were obtained from the base of the skull through the vertex without intravenous contrast. COMPARISON:  07/18/2016 CT and MRI FINDINGS: Brain: Moderate cerebral atrophy with chronic appearing small vessel ischemic disease. Remote bilateral infarcts again noted with encephalomalacia involving the posterior right frontal and high left parietal lobes. Vascular: Moderate atherosclerosis of the carotid siphons. No hyperdense vessels. Skull: The scalp soft tissues and bony calvarium are unremarkable. Sinuses/Orbits: Bilateral lens replacements. Intact orbits and globes. No acute paranasal sinus disease. Chronic right mastoid effusion. Other: None IMPRESSION: 1. Cerebral atrophy with chronic infarcts in the posterior right frontal and high left parietal lobes. Moderate degree of small vessel ischemia, chronic in appearance. No acute intracranial abnormality. 2. Chronic right mastoid effusion. Electronically Signed   By: Tollie Eth M.D.   On: 10/08/2016 21:30   Ct Angio Neck W Or Wo Contrast  Addendum Date: 10/19/2016   ADDENDUM REPORT: 10/19/2016 16:00 ADDENDUM: CTA compared with the prior CTA of  07/18/2016. Left carotid stent is patent although the lumen is narrowed in the midportion of the stent. Minimal luminal diameter 2.4 mm compared with 5.2 mm in the proximal stent corresponds to 55 percent diameter stenosis. This is unchanged from the prior CTA. No intraluminal filling defect. Electronically Signed   By: Marlan Palau M.D.   On: 10/19/2016 16:00   Result Date: 10/19/2016 CLINICAL DATA:  Code stroke.  Recent stroke  with neuro changes. EXAM: CT ANGIOGRAPHY HEAD AND NECK CT PERFUSION BRAIN TECHNIQUE: Multidetector CT imaging of the head and neck was performed using the standard protocol during bolus administration of intravenous contrast. Multiplanar CT image reconstructions and MIPs were obtained to evaluate the vascular anatomy. Carotid stenosis measurements (when applicable) are obtained utilizing NASCET criteria, using the distal internal carotid diameter as the denominator. Multiphase CT imaging of the brain was performed following IV bolus contrast injection. Subsequent parametric perfusion maps were calculated using RAPID software. CONTRAST:  100 mL Isovue 370 IV COMPARISON:  CT head 10/18/2016, MRI 10/09/2016 FINDINGS: CTA NECK FINDINGS Aortic arch: Advanced atherosclerotic disease in the aortic arch. Proximal great vessels patent with mild atherosclerotic disease Right carotid system: Mild atherosclerotic disease of the right carotid bifurcation without significant stenosis Left carotid system: Stent across the left carotid bifurcation extending into the left internal carotid artery. Stent is widely patent. Moderate stenosis origin of the external carotid artery. Vertebral arteries: Both vertebral arteries patent to the basilar without significant stenosis. Mild atherosclerotic disease distal left vertebral artery. Skeleton: Cervical degenerative change. No acute skeletal abnormality. Other neck: Negative Upper chest: Negative Review of the MIP images confirms the above findings CTA HEAD FINDINGS Anterior circulation: Atherosclerotic calcification right cavernous carotid without significant stenosis. Moderate stenosis right M1/M2. Right anterior cerebral artery patent. Atherosclerotic calcification left cavernous carotid without significant stenosis. Left anterior and middle cerebral artery patent without significant stenosis Posterior circulation: Both vertebral arteries contribute to the basilar. Mild stenosis distal  left vertebral artery. Left vertebral artery dominant. Right PICA patent. Left PICA appears supplied from the left AICA. Basilar widely patent. Fetal origin of the posterior cerebral artery on the left. Both posterior cerebral arteries widely patent without stenosis. Venous sinuses: Patent Anatomic variants: None Delayed phase: Not performed Review of the MIP images confirms the above findings CT Brain Perfusion Findings: CBF (<30%) Volume: 8 mLmL in the left occipital lobe Perfusion (Tmax>6.0s) volume: 97 mLmL. Delayed perfusion is present in the left occipital lobe as well as in the left watershed territory. Delayed perfusion also noted in the right temporal and occipital lobe. Mismatch Volume: 89mL Infarction Location:Left occipital lobe small infarct. Bilateral delayed perfusion IMPRESSION: Carotid and vertebral arteries are patent in the neck with mild atherosclerotic disease. Left carotid stent widely patent. No emergent large vessel occlusion CT perfusion suggests small area of infarct in the left occipital lobe. Bilateral delayed perfusion may be related to acute ischemia or severe intracranial small vessel atherosclerotic disease. These results were called by telephone at the time of interpretation on 10/18/2016 at 9:15 am to Dr. Wilford Corner, who verbally acknowledged these results. Electronically Signed: By: Marlan Palau M.D. On: 10/18/2016 09:16   Mr Brain Wo Contrast  Result Date: 10/18/2016 CLINICAL DATA:  Stroke EXAM: MRI HEAD WITHOUT CONTRAST TECHNIQUE: Multiplanar, multiecho pulse sequences of the brain and surrounding structures were obtained without intravenous contrast. COMPARISON:  CT and CT perfusion 10/18/2016 FINDINGS: Brain: Image quality degraded by motion Acute infarct in the left occipital parietal cortex corresponding to the CT perfusion area.  Additional small areas of acute infarct in the left superior thalamus, left medial parietal lobe, and left frontal lobe, and deep white matter on the  left. Distribution is suggestive of watershed ischemia. Small area of acute infarct in the posterior limb internal capsule is subacute and was present on MRI of 10/09/2016 Chronic microhemorrhage right thalamus unchanged from the prior MRI. Generalized atrophy. Chronic infarct right frontal lobe and throughout the cerebral white matter bilaterally. Bifrontal subdural hygroma unchanged. No midline shift. Vascular: Normal arterial flow voids Skull and upper cervical spine: Negative Sinuses/Orbits: Negative Other: None IMPRESSION: Interval development of acute infarct in the left occipital parietal cortex. Watershed areas of acute infarct in the left frontal and parietal lobe. Acute infarct left superior thalamus. Subacute infarct posterior limb internal capsule on the right unchanged from recent MRI Atrophy and chronic ischemic changes. Electronically Signed   By: Marlan Palau M.D.   On: 10/18/2016 10:27   Mr Brain Wo Contrast  Result Date: 10/09/2016 CLINICAL DATA:  Two days of weakness and confusion, dizziness and presyncope. History of hypertension, carotid artery disease and stroke. EXAM: MRI HEAD WITHOUT CONTRAST TECHNIQUE: Multiplanar, multiecho pulse sequences of the brain and surrounding structures were obtained without intravenous contrast. COMPARISON:  MRI of the head July 18, 2016 and CT HEAD October 08, 2016 FINDINGS: BRAIN: 1 cm ovoid reduced diffusion RIGHT posterior limb of the internal capsule to optic radiations. Corresponding low ADC values. Stable RIGHT thalamus and basal ganglia to internal capsule old micro hemorrhages. Chronic LEFT frontal microhemorrhage. Ventricles and sulci are normal for patient's age. Small area RIGHT frontal, LEFT occipital and and multifocal LEFT frontoparietal encephalomalacia. Old LEFT thalamus and LEFT caudate lacunar infarcts. Patchy to confluent supratentorial white matter FLAIR T2 hyperintensities. No midline shift, mass effect or masses. Prominent basal ganglia  perivascular spaces associated with chronic small vessel ischemic disease. Stable 9 mm LEFT frontal extra-axial extra-axial space. VASCULAR: Normal major intracranial vascular flow voids present at skull base. SKULL AND UPPER CERVICAL SPINE: No abnormal sellar expansion. No suspicious calvarial bone marrow signal. Craniocervical junction maintained. SINUSES/ORBITS: RIGHT mastoid effusion. Included paranasal sinuses are well-aerated. The included ocular globes and orbital contents are non-suspicious. OTHER: Patient is edentulous. IMPRESSION: 1. Acute 1 cm RIGHT posterior limb of internal capsule infarct. 2. Old RIGHT frontal (MCA territory), LEFT occipital lobe (PCA territory) and LEFT frontoparietal (MCA territory) infarcts. Old LEFT lacunar infarcts. Moderate to severe chronic small vessel ischemic disease. 3. Old small LEFT frontal subdural hematoma versus hygroma. Electronically Signed   By: Awilda Metro M.D.   On: 10/09/2016 22:54   Ct Cerebral Perfusion W Contrast  Addendum Date: 10/19/2016   ADDENDUM REPORT: 10/19/2016 16:00 ADDENDUM: CTA compared with the prior CTA of 07/18/2016. Left carotid stent is patent although the lumen is narrowed in the midportion of the stent. Minimal luminal diameter 2.4 mm compared with 5.2 mm in the proximal stent corresponds to 55 percent diameter stenosis. This is unchanged from the prior CTA. No intraluminal filling defect. Electronically Signed   By: Marlan Palau M.D.   On: 10/19/2016 16:00   Result Date: 10/19/2016 CLINICAL DATA:  Code stroke.  Recent stroke with neuro changes. EXAM: CT ANGIOGRAPHY HEAD AND NECK CT PERFUSION BRAIN TECHNIQUE: Multidetector CT imaging of the head and neck was performed using the standard protocol during bolus administration of intravenous contrast. Multiplanar CT image reconstructions and MIPs were obtained to evaluate the vascular anatomy. Carotid stenosis measurements (when applicable) are obtained utilizing NASCET criteria, using  the  distal internal carotid diameter as the denominator. Multiphase CT imaging of the brain was performed following IV bolus contrast injection. Subsequent parametric perfusion maps were calculated using RAPID software. CONTRAST:  100 mL Isovue 370 IV COMPARISON:  CT head 10/18/2016, MRI 10/09/2016 FINDINGS: CTA NECK FINDINGS Aortic arch: Advanced atherosclerotic disease in the aortic arch. Proximal great vessels patent with mild atherosclerotic disease Right carotid system: Mild atherosclerotic disease of the right carotid bifurcation without significant stenosis Left carotid system: Stent across the left carotid bifurcation extending into the left internal carotid artery. Stent is widely patent. Moderate stenosis origin of the external carotid artery. Vertebral arteries: Both vertebral arteries patent to the basilar without significant stenosis. Mild atherosclerotic disease distal left vertebral artery. Skeleton: Cervical degenerative change. No acute skeletal abnormality. Other neck: Negative Upper chest: Negative Review of the MIP images confirms the above findings CTA HEAD FINDINGS Anterior circulation: Atherosclerotic calcification right cavernous carotid without significant stenosis. Moderate stenosis right M1/M2. Right anterior cerebral artery patent. Atherosclerotic calcification left cavernous carotid without significant stenosis. Left anterior and middle cerebral artery patent without significant stenosis Posterior circulation: Both vertebral arteries contribute to the basilar. Mild stenosis distal left vertebral artery. Left vertebral artery dominant. Right PICA patent. Left PICA appears supplied from the left AICA. Basilar widely patent. Fetal origin of the posterior cerebral artery on the left. Both posterior cerebral arteries widely patent without stenosis. Venous sinuses: Patent Anatomic variants: None Delayed phase: Not performed Review of the MIP images confirms the above findings CT Brain  Perfusion Findings: CBF (<30%) Volume: 8 mLmL in the left occipital lobe Perfusion (Tmax>6.0s) volume: 97 mLmL. Delayed perfusion is present in the left occipital lobe as well as in the left watershed territory. Delayed perfusion also noted in the right temporal and occipital lobe. Mismatch Volume: 89mL Infarction Location:Left occipital lobe small infarct. Bilateral delayed perfusion IMPRESSION: Carotid and vertebral arteries are patent in the neck with mild atherosclerotic disease. Left carotid stent widely patent. No emergent large vessel occlusion CT perfusion suggests small area of infarct in the left occipital lobe. Bilateral delayed perfusion may be related to acute ischemia or severe intracranial small vessel atherosclerotic disease. These results were called by telephone at the time of interpretation on 10/18/2016 at 9:15 am to Dr. Wilford Corner, who verbally acknowledged these results. Electronically Signed: By: Marlan Palau M.D. On: 10/18/2016 09:16   Dg Chest Port 1 View  Result Date: 10/10/2016 CLINICAL DATA:  CHF EXAM: PORTABLE CHEST 1 VIEW COMPARISON:  10/08/2016 chest radiograph. FINDINGS: Stable cardiomediastinal silhouette with mild cardiomegaly and aortic atherosclerosis. No pneumothorax. No pleural effusion. Lungs appear clear, with no acute consolidative airspace disease and no pulmonary edema. IMPRESSION: Stable mild cardiomegaly without pulmonary edema. No active pulmonary disease. Electronically Signed   By: Delbert Phenix M.D.   On: 10/10/2016 16:35   Ct Head Code Stroke Wo Contrast`  Result Date: 10/18/2016 CLINICAL DATA:  Code stroke. Subacute neuro deficits. History of recent stroke EXAM: CT HEAD WITHOUT CONTRAST TECHNIQUE: Contiguous axial images were obtained from the base of the skull through the vertex without intravenous contrast. COMPARISON:  CT 10/08/2016, MRI 10/09/2016 FINDINGS: Brain: Recent infarct in the posterior limb internal capsule on the right. No progression of this area.  Negative for acute hemorrhage. Negative for acute infarct or mass Generalized moderate atrophy. Negative for hydrocephalus. Extensive chronic ischemic change throughout the white matter. Chronic infarcts in the right frontal lobe, and left frontal convexity. Small chronic infarct left occipital lobe. Chronic infarct left thalamus. Vascular:  Negative for hyperdense vessel. Extensive atherosclerotic calcification Skull: Chronic fracture right zygomatic arch Sinuses/Orbits: Right mastoid sinus effusion. Paranasal sinuses clear. Other: None ASPECTS (Alberta Stroke Program Early CT Score) - Ganglionic level infarction (caudate, lentiform nuclei, internal capsule, insula, M1-M3 cortex): 7 - Supraganglionic infarction (M4-M6 cortex): 3 Total score (0-10 with 10 being normal): 10 IMPRESSION: 1. No acute abnormality. Recent infarct posterior limb internal capsule 2. ASPECTS is 10 3. Extensive chronic ischemic change These results were called by telephone at the time of interpretation on 10/18/2016 at 8:38 am to Dr. Wilford Corner, who verbally acknowledged these results. Electronically Signed   By: Marlan Palau M.D.   On: 10/18/2016 08:39    Microbiology: No results found for this or any previous visit (from the past 240 hour(s)).   Labs: Basic Metabolic Panel:  Recent Labs Lab 10/18/16 0800 10/18/16 0810 10/20/16 0422  NA 130* 133* 133*  K 4.4 4.5 3.8  CL 97* 96* 107  CO2 27  --  20*  GLUCOSE 126* 120* 95  BUN 13 15 11   CREATININE 1.57* 1.60* 1.46*  CALCIUM 9.1  --  8.6*   Liver Function Tests:  Recent Labs Lab 10/18/16 0800  AST 26  ALT 15*  ALKPHOS 80  BILITOT 1.1  PROT 8.5*  ALBUMIN 3.4*   No results for input(s): LIPASE, AMYLASE in the last 168 hours. No results for input(s): AMMONIA in the last 168 hours. CBC:  Recent Labs Lab 10/18/16 0800 10/18/16 0810 10/20/16 0422  WBC 7.6  --  6.6  NEUTROABS 5.5  --   --   HGB 10.4* 11.2* 9.2*  HCT 30.9* 33.0* 27.9*  MCV 94.2  --  94.3  PLT  209  --  176   Cardiac Enzymes:  Recent Labs Lab 10/20/16 0424 10/20/16 0731 10/20/16 1008  TROPONINI 0.05* 0.05* 0.04*   BNP: BNP (last 3 results) No results for input(s): BNP in the last 8760 hours.  ProBNP (last 3 results) No results for input(s): PROBNP in the last 8760 hours.  CBG:  Recent Labs Lab 10/18/16 0753  GLUCAP 113*       Signed:  Edsel Petrin  [facility] 10/20/2016, 1:18 PM

## 2016-10-20 NOTE — Progress Notes (Signed)
  Speech Language Pathology Treatment: Dysphagia  Patient Details Name: [name] MRN: [id number] DOB: 06-28-1926 Today's Date: 10/20/2016 Time: 3335-4562 SLP Time Calculation (min) (ACUTE ONLY): 29 min  Assessment / Plan / Recommendation Clinical Impression  Observed pt with thin liquid consistencies. Pt demonstrated self feeding; immediate cough noted following both sips of thin liquid via cup. Daughter mentioned that coughing following eating or drinking is "normal" for him. Considering pt's current status post stroke, suspicion of lingual weakness and immediate cough following bolus transit, MBSS is warranted. Will f/u with objective swallow study.   HPI HPI: Jonathon Khamjumphonis a 83 y.o.malewith a history of atrial fibrillation with chads 2 vascular score of 6, CAD, history of multiple strokes on anticoagulation, with his most recent stroke at the end of August 2018 while on Pradaxa. History is obtained by family members as patient is poor historian. Patient was last seen normal yesterday morning. The patient became gradually more weak.  This morning, the patient woke up and appeared more confused, unable to recognize family members, slurred speech, weakness on his right side, preference of leftward gaze. Patient was brought to the emergency department by private vehicle. He was not considered a TPA candidate due to being on Pradaxa. Since being in the emergency department, his symptoms have been improving.  MRI is showing left occipital CVA with watershed area of left frontal and parietal lobes as well as left superior thalamus.        SLP Plan  MBS       Recommendations  Diet recommendations: Dysphagia 2 (fine chop);Thin liquid Liquids provided via: Cup Medication Administration: Crushed with puree Postural Changes and/or Swallow Maneuvers: Seated upright 90 degrees                Oral Care Recommendations: Oral care BID SLP Visit Diagnosis: Dysphagia, oropharyngeal  phase (R13.12) Plan: MBS       GO                Carmela Rima, Student SLP 10/20/2016, 12:00 PM

## 2016-10-20 NOTE — Evaluation (Signed)
Speech Language Pathology Evaluation Patient Details Name: [name] MRN: [id number] DOB: [birth year] Today's Date: 10/20/2016 Time: 4098-1191 SLP Time Calculation (min) (ACUTE ONLY): 29 min  Problem List:  Patient Active Problem List   Diagnosis Date Noted  . Stenosis of left carotid artery   . Acute ischemic stroke (HCC) 10/18/2016  . Cerebral infarction due to stenosis of right middle cerebral artery (HCC) 10/10/2016  . CVA (cerebral vascular accident) (HCC) 07/18/2016  . Stroke-like symptoms 07/18/2016  . Stroke (HCC) 07/18/2016  . Anemia 07/18/2016  . Non-English speaking patient 07/18/2016  . COPD, group B, by GOLD 2017 classification (HCC)   . Nausea, vomiting and diarrhea 10/03/2015  . CKD (chronic kidney disease), stage III 07/27/2015  . Carotid artery disease (HCC) 07/27/2015  . Hyponatremia 06/18/2014  . Paroxysmal atrial fibrillation (HCC) 11/16/2013  . Ischemic cardiomyopathy 11/16/2013  . Headache 11/16/2013  . Chronic anticoagulation 11/16/2013  . Neck pain on left side 11/16/2013  . Chest pain at rest 11/16/2013  . Coronary artery disease 10/14/2013  . Chronic systolic heart failure (HCC) 10/14/2013  . AAA (abdominal aortic aneurysm) without rupture (HCC) 09/08/2013  . Hyperlipidemia 06/08/2013  . History of CVA (cerebrovascular accident) 05/02/2013  . PVD (peripheral vascular disease) 3.5cm AAA Aug 2014 04/29/2013  . NSTEMI - ? type 2 - Troponin 0.63 04/28/2013  . Chronic bilateral lower abdominal pain 02/17/2013  . Hypertension    Past Medical History:  Past Medical History:  Diagnosis Date  . AAA (abdominal aortic aneurysm) (HCC)   . Anemia   . Asthma   . Carotid artery disease (HCC)   . CHF (congestive heart failure) (HCC)   . Coronary artery disease   . Depression   . Gout   . H. pylori infection   . Hard of hearing   . Hiatal hernia   . Hyperplasia, prostate   . Hypertension   . PUD (peptic ulcer disease)   . Renal failure, acute  (HCC) 11/08/2012   Past Surgical History:  Past Surgical History:  Procedure Laterality Date  . CAROTID STENT INSERTION  2015  . CAROTID STENT INSERTION N/A 05/09/2013   Procedure: CAROTID STENT INSERTION;  Surgeon: Runell Gess, MD;  Location: [facility] CATH LAB;  Service: Cardiovascular;  Laterality: N/A;  . ESOPHAGOGASTRODUODENOSCOPY  06/13/2008,12/31/12  . LEFT HEART CATHETERIZATION WITH CORONARY ANGIOGRAM N/A 05/04/2013   Procedure: LEFT HEART CATHETERIZATION WITH CORONARY ANGIOGRAM;  Surgeon: Peter M Swaz[place], MD;  Location: [facility] CATH LAB;  Service: Cardiovascular;  Laterality: N/A;   HPI:  [name] a 83 y.o.malewith a history of atrial fibrillation with chads 2 vascular score of 6, CAD, history of multiple strokes on anticoagulation, with his most recent stroke at the end of August 2018 while on Pradaxa. History is obtained by family members as patient is poor historian. Patient was last seen normal yesterday morning. The patient became gradually more weak.  This morning, the patient woke up and appeared more confused, unable to recognize family members, slurred speech, weakness on his right side, preference of leftward gaze. Patient was brought to the emergency department by private vehicle. He was not considered a TPA candidate due to being on Pradaxa. Since being in the emergency department, his symptoms have been improving.  MRI is showing left occipital CVA with watershed area of left frontal and parietal lobes as well as left superior thalamus.     Assessment / Plan / Recommendation Clinical Impression   Pt demonstrates mild to moderate aphasia with primarily expressive language  deficits. All communication was interpreted through his daughter who states that he is unable to recall family member names and is recently unable to recognize some family members in the room. Suspected visual field cut on the right which could be causing deficits in recognition of objects or family members  on that side. Pt completed confrontation naming task with 25% accuracy and minimal verbal cueing. Pt demonstrated ability to follow 1-step commands with 75% accuracy with moderate cueing. Perseveration was observed. Per pt's daughter, no grammatical or articulation concerns are noted, however SLP suspects mild decrease in lingual movement during speech. Will f/u for functional communication.     SLP Assessment  SLP Recommendation/Assessment: Patient needs continued Speech Lanaguage Pathology Services SLP Visit Diagnosis: Aphasia (R47.01)    Follow Up Recommendations       Frequency and Duration min 2x/week  2 weeks      SLP Evaluation Cognition  Overall Cognitive Status: Impaired/Different from baseline Arousal/Alertness: Awake/alert Orientation Level: Oriented to person Memory: Impaired Memory Impairment: Decreased long term memory Decreased Long Term Memory: Verbal basic Awareness: Impaired Awareness Impairment: Intellectual impairment Problem Solving: Appears intact       Comprehension  Auditory Comprehension Commands: Impaired Interfering Components: Visual impairments;Hearing EffectiveTechniques: Increased volume;Repetition Visual Recognition/Discrimination Discrimination: Exceptions to [facility] Reading Comprehension Reading Status: Not tested    Expression Expression Primary Mode of Expression: Verbal Verbal Expression Overall Verbal Expression: Impaired Automatic Speech: Counting;Name Level of Generative/Spontaneous Verbalization: Sentence Naming: Impairment Responsive: 0-25% accurate Confrontation: Impaired Verbal Errors: Perseveration;Not aware of errors   Oral / Motor  Oral Motor/Sensory Function Overall Oral Motor/Sensory Function: Mild impairment Facial Symmetry: Abnormal symmetry left Lingual ROM: Within Functional Limits Lingual Symmetry: Within Functional Limits Mandible: Within Functional Limits Motor Speech Overall Motor Speech: Impaired Phonation:  Normal Resonance: Within functional limits Articulation: Impaired Level of Impairment: Word Intelligibility: Intelligible   GO                    Carmela Rima, Student SLP 10/20/2016, 1:24 PM

## 2016-10-20 NOTE — Progress Notes (Signed)
Physical Therapy Treatment Patient Details Name: [name] MRN: [id number] DOB: Dec 10, 1926 Today's Date: 10/20/2016    History of Present Illness [name] is a 83 y.o. male with a history of atrial fibrillation with chads 2 vascular score of 6, CAD, history of multiple strokes on anticoagulation, with his most recent stroke at the end of August 2018 while on Pradaxa; At time of admission, appeared more confused, unable to recognize family members, slurred speech, weakness on his right side, preference of leftward gaze; Imaging shows infarct in the left occipital parietal cortex with watershed areas of the left frontal and parietal lobes. He also has a acute infarct of the left superior thalamus.    PT Comments    Pt with increased lethargy this date but agreeable to use bathroom and get up in chair. Per daughter pt weaker today than previous days. Pt deferred further amb and exercises due to fatigue. Family educated on importance of mobility and exercise to improve strength. Acute PT to con't to follow.   Follow Up Recommendations  Home health PT     Equipment Recommendations  None recommended by PT    Recommendations for Other Services OT consult     Precautions / Restrictions Precautions Precautions: Fall Precaution Comments: watch O2 sats Restrictions Weight Bearing Restrictions: No    Mobility  Bed Mobility Overal bed mobility: Needs Assistance Bed Mobility: Supine to Sit     Supine to sit: Min guard     General bed mobility comments: dtr placed HOB up all the way despite education that he doesn' thave that luxury at home  Transfers Overall transfer level: Needs assistance Equipment used: Rolling walker (2 wheeled) Transfers: Sit to/from Stand Sit to Stand: Min assist         General transfer comment: tactile cues for hand placement  Ambulation/Gait Ambulation/Gait assistance: Min assist Ambulation Distance (Feet): 10 Feet (x2, to/from  bathroom) Assistive device: Rolling walker (2 wheeled) Gait Pattern/deviations: Step-through pattern;Decreased stride length;Trunk flexed Gait velocity: slow Gait velocity interpretation: Below normal speed for age/gender General Gait Details: v/c's to stay in walker, + SOB, SPO2 >90% HR 100   Stairs            Wheelchair Mobility    Modified Rankin (Stroke Patients Only)       Balance Overall balance assessment: Needs assistance Sitting-balance support: No upper extremity supported;Feet supported Sitting balance-Leahy Scale: Good Sitting balance - Comments: sitting EOB   Standing balance support: Bilateral upper extremity supported;During functional activity Standing balance-Leahy Scale: Poor                              Cognition Arousal/Alertness: Awake/alert (but sleepy) Behavior During Therapy: WFL for tasks assessed/performed Overall Cognitive Status: Within Functional Limits for tasks assessed                                        Exercises      General Comments General comments (skin integrity, edema, etc.): family acting as intpreter      Pertinent Vitals/Pain Pain Assessment: No/denies pain    Home Living                      Prior Function            PT Goals (current goals can now be found in the  care plan section) Progress towards PT goals: Not progressing toward goals - comment (pt with increased lethargy)    Frequency    Min 3X/week      PT Plan Current plan remains appropriate    Co-evaluation              AM-PAC PT "6 Clicks" Daily Activity  Outcome Measure  Difficulty turning over in bed (including adjusting bedclothes, sheets and blankets)?: A Lot Difficulty moving from lying on back to sitting on the side of the bed? : A Lot Difficulty sitting down on and standing up from a chair with arms (e.g., wheelchair, bedside commode, etc,.)?: A Lot Help needed moving to and from a bed to  chair (including a wheelchair)?: A Little Help needed walking in hospital room?: A Little Help needed climbing 3-5 steps with a railing? : A Lot 6 Click Score: 14    End of Session Equipment Utilized During Treatment: Gait belt Activity Tolerance: Patient tolerated treatment well Patient left: in chair;with call bell/phone within reach;with chair alarm set Nurse Communication: Mobility status PT Visit Diagnosis: Unsteadiness on feet (R26.81);Difficulty in walking, not elsewhere classified (R26.2);Muscle weakness (generalized) (M62.81)     Time: 1610-9604 PT Time Calculation (min) (ACUTE ONLY): 29 min  Charges:  $Therapeutic Activity: 23-37 mins                    G Codes:       [name], PT, DPT Pager #: [phone] Office #: [phone]    [name] M [name] 10/20/2016, 9:41 AM

## 2016-10-20 NOTE — Progress Notes (Signed)
Modified Barium Swallow Progress Note  Patient Details  Name: [name] MRN: [id number] Date of Birth: November 04, 1926  Today's Date: 10/20/2016  Modified Barium Swallow completed.  Full report located under Chart Review in the Imaging Section.  Brief recommendations include the following:  Clinical Impression  Patient presents with a functional oropharyngeal swallow. Oral phase milldy delayed with peicemeal mastication/transit of bolus, likely a result of CVAs, cognitive deficits, and missing dentition (dentures at home). Trace vallecular and pyriform sinus coating noted post swallow, tight appearing CP may be presenting mild pressure changes during the swallow. Patient fully clears pharynx however with subsequent swallows. Full airway protection noted. No SLP f/u indicated for dysphagia.    Swallow Evaluation Recommendations       SLP Diet Recommendations: Dysphagia 2 (Fine chop) solids;Thin liquid (may advance to regular solids as tolerated with dentures)   Liquid Administration via: Cup;Straw   Medication Administration: Whole meds with liquid   Supervision: Patient able to self feed;Intermittent supervision to cue for compensatory strategies   Compensations: Slow rate;Small sips/bites   Postural Changes: Seated upright at 90 degrees   Oral Care Recommendations: Oral care BID      Jonathon Lango MA, CCC-SLP [phone]   [name] 10/20/2016,3:02 PM

## 2016-10-20 NOTE — Progress Notes (Signed)
Educated pt. On discharge. Pt. And Family both verbalized  Understanding of discharge . Pt. IV was removed. This RN took the Pt. Down in a wheel chair.

## 2016-10-21 ENCOUNTER — Encounter (HOSPITAL_COMMUNITY): Payer: Self-pay | Admitting: Emergency Medicine

## 2016-10-21 ENCOUNTER — Emergency Department (HOSPITAL_COMMUNITY): Payer: Medicare Other

## 2016-10-21 ENCOUNTER — Other Ambulatory Visit: Payer: Self-pay

## 2016-10-21 ENCOUNTER — Emergency Department (HOSPITAL_COMMUNITY)
Admission: EM | Admit: 2016-10-21 | Discharge: 2016-10-21 | Disposition: A | Discharge status: Home / Self Care | Payer: Medicare Other | Attending: Emergency Medicine | Admitting: Emergency Medicine

## 2016-10-21 DIAGNOSIS — Z91040 Latex allergy status: Secondary | ICD-10-CM | POA: Diagnosis not present

## 2016-10-21 DIAGNOSIS — I480 Paroxysmal atrial fibrillation: Secondary | ICD-10-CM | POA: Diagnosis not present

## 2016-10-21 DIAGNOSIS — N183 Chronic kidney disease, stage 3 (moderate): Secondary | ICD-10-CM | POA: Diagnosis not present

## 2016-10-21 DIAGNOSIS — Z79899 Other long term (current) drug therapy: Secondary | ICD-10-CM | POA: Diagnosis not present

## 2016-10-21 DIAGNOSIS — Z8673 Personal history of transient ischemic attack (TIA), and cerebral infarction without residual deficits: Secondary | ICD-10-CM | POA: Diagnosis not present

## 2016-10-21 DIAGNOSIS — Z87891 Personal history of nicotine dependence: Secondary | ICD-10-CM | POA: Insufficient documentation

## 2016-10-21 DIAGNOSIS — R4182 Altered mental status, unspecified: Secondary | ICD-10-CM | POA: Diagnosis present

## 2016-10-21 DIAGNOSIS — I2510 Atherosclerotic heart disease of native coronary artery without angina pectoris: Secondary | ICD-10-CM | POA: Insufficient documentation

## 2016-10-21 DIAGNOSIS — E785 Hyperlipidemia, unspecified: Secondary | ICD-10-CM | POA: Insufficient documentation

## 2016-10-21 DIAGNOSIS — I5022 Chronic systolic (congestive) heart failure: Secondary | ICD-10-CM | POA: Insufficient documentation

## 2016-10-21 DIAGNOSIS — I255 Ischemic cardiomyopathy: Secondary | ICD-10-CM | POA: Insufficient documentation

## 2016-10-21 DIAGNOSIS — J449 Chronic obstructive pulmonary disease, unspecified: Secondary | ICD-10-CM | POA: Diagnosis not present

## 2016-10-21 DIAGNOSIS — I130 Hypertensive heart and chronic kidney disease with heart failure and stage 1 through stage 4 chronic kidney disease, or unspecified chronic kidney disease: Secondary | ICD-10-CM | POA: Insufficient documentation

## 2016-10-21 LAB — CBC WITH DIFFERENTIAL/PLATELET
Basophils Absolute: 0 10*3/uL (ref 0.0–0.1)
Basophils Relative: 0 %
Eosinophils Absolute: 0.2 10*3/uL (ref 0.0–0.7)
Eosinophils Relative: 3 %
HCT: 24.9 % — ABNORMAL LOW (ref 39.0–52.0)
Hemoglobin: 8.5 g/dL — ABNORMAL LOW (ref 13.0–17.0)
Lymphocytes Relative: 17 %
Lymphs Abs: 1 10*3/uL (ref 0.7–4.0)
MCH: 32.3 pg (ref 26.0–34.0)
MCHC: 34.1 g/dL (ref 30.0–36.0)
MCV: 94.7 fL (ref 78.0–100.0)
Monocytes Absolute: 0.5 10*3/uL (ref 0.1–1.0)
Monocytes Relative: 9 %
Neutro Abs: 4.3 10*3/uL (ref 1.7–7.7)
Neutrophils Relative %: 71 %
Platelets: 192 10*3/uL (ref 150–400)
RBC: 2.63 MIL/uL — ABNORMAL LOW (ref 4.22–5.81)
RDW: 15.1 % (ref 11.5–15.5)
WBC: 6 10*3/uL (ref 4.0–10.5)

## 2016-10-21 LAB — BASIC METABOLIC PANEL
Anion gap: 6 (ref 5–15)
BUN: 13 mg/dL (ref 6–20)
CO2: 21 mmol/L — ABNORMAL LOW (ref 22–32)
Calcium: 8.5 mg/dL — ABNORMAL LOW (ref 8.9–10.3)
Chloride: 104 mmol/L (ref 101–111)
Creatinine, Ser: 1.73 mg/dL — ABNORMAL HIGH (ref 0.61–1.24)
GFR calc Af Amer: 39 mL/min — ABNORMAL LOW (ref >60)
GFR calc non Af Amer: 33 mL/min — ABNORMAL LOW (ref >60)
Glucose, Bld: 128 mg/dL — ABNORMAL HIGH (ref 65–99)
Potassium: 3.6 mmol/L (ref 3.5–5.1)
Sodium: 131 mmol/L — ABNORMAL LOW (ref 135–145)

## 2016-10-21 NOTE — Patient Outreach (Signed)
[facility]) Care Management  10/21/2016  [name] Jun 30, 1926 [id number]  EMMI: stroke Referral date: 10/21/16 Referral source: EMMI stroke RED alert Referral reason: Feeling worse overall: YES, Has been around smoke: YES Day # 6  Telephone call to patient regarding EMMI stroke red alert.  Contact answering phone identified herself as patients caregiver and daughter, Gelene Mink. Daughter states patient is unable to speak or hear well.  Daughter verified HIPAA for patient.  Daughter states patient was discharged from the hospital on yesterday. Daughter states patient is sleeping a lot, a little confused when he wakes up but states as patient sits up for a while the confusion resolves. Daughter states patient is very weak, tired and does not feel like doing anything but laying down. Daughter states she has to assist patient within everything. Daughter states she has not called and scheduled an appointment with patients primary MD as of yet. RNCM advised daughter to call patients doctor today and report his symptoms and attempt to schedule a follow up appointment.  Discussed signs of stroke.  Advised daughter to call 911 for patient if stroke symptoms noted.  Daughter states patient was in the hospital a week ago for a stroke, was discharged home and started having more stroke symptoms. Daughter states patient was readmitted to the hospital due to 2nd stroke. States patient was just discharged yesterday, 10/20/16.  Daughter states she will call to notify doctors office of patients symptoms.   PLAN: RNCM will follow up with patients daughter within 3 business days.   [name] RN,BSN,CCM [facility] Telephonic  [phone]

## 2016-10-21 NOTE — ED Triage Notes (Signed)
Pt to ED via [facility] with reports from pt's family that pt has been more disoriented than usual.  Onset at 0600 today.  [place] interpreter at bedside but family st's pt will not use this St's he won't use the one at MD"s office either.  Pt will answer pt's questions, speaking in there language [place]).  Pt moves all extremities well.

## 2016-10-21 NOTE — ED Provider Notes (Signed)
[facility]-EMERGENCY DEPT Provider Note   CSN: [id number] Arrival date & time: 10/21/16  1713     History   Chief Complaint Chief Complaint  Patient presents with  . Altered Mental Status    HPI [name] is a 83 y.o. male.  HPI   89yM with AMS. Noticed by family around 0600 this morning. Apparently he spit on the floor and said he had actually urinated. History difficult. Pt refuses to even acknowledge interpretor although he will speak through family. Earlier today, he was "talking backwards." Currently they say his speech seems fine and seems like he is at his baseline. Just discharged yesterday form hospital after admit with acute CVA. Hospital course as follows:  Acute CVA -Patient was on Pradaxa for prior CVAs- currently renally adjusted -CT head: No acute abnormality. Recent infarct posterior limb internal capsule. -MRI brain: Interval development of acute infarct of the left occipital parietal cortex. Watershed areas of acute infarct in the left frontal and parietal lobe. Acute infarct left superior thalamus. Subacute infarct posterior limb internal capsule on the right (unchanged from prior MRI) -CT cerebral perfusion: Carotid vertebral arteries are patent in the neck with mild atherosclerotic disease. Small area of infarct in the left occipital lobe. Addendum: Left carotid stent is patent although lumen in narrowed in the midportion of the stent, 55% stenosis.  -Echocardiogram EF 50-55%, no RWMA -Hemoglobin A1c 6, HDL 45 -PT, OT consulted  -Speech consulted, recommended dysphagia 2 diet with thin liquids, no straws. -Neurology consulted and appreciated- recommended discuss with Dr. Allyson Sabal and wait to restart Pradaxa until 9/11 given size of stroke and bleeding risk -?Should patient be switched to Coumadin or Lovenox -Continue aspirin, statin -Upon review of EPIC, patient has been on Eliquis, Xarelto, and Pradaxa -Discussed with Dr. Allyson Sabal and Dr. Roda Shutters, unfortunately,  would not try lovenox or coumadin at this time or  -Patient will need to follow up with Dr. Allyson Sabal and Dr. Pearlean Brownie  Past Medical History:  Diagnosis Date  . AAA (abdominal aortic aneurysm) (HCC)   . Anemia   . Asthma   . Carotid artery disease (HCC)   . CHF (congestive heart failure) (HCC)   . Coronary artery disease   . Depression   . Gout   . H. pylori infection   . Hard of hearing   . Hiatal hernia   . Hyperplasia, prostate   . Hypertension   . PUD (peptic ulcer disease)   . Renal failure, acute (HCC) 11/08/2012    Patient Active Problem List   Diagnosis Date Noted  . Stenosis of left carotid artery   . Acute ischemic stroke (HCC) 10/18/2016  . Cerebral infarction due to stenosis of right middle cerebral artery (HCC) 10/10/2016  . CVA (cerebral vascular accident) (HCC) 07/18/2016  . Stroke-like symptoms 07/18/2016  . Stroke (HCC) 07/18/2016  . Anemia 07/18/2016  . Non-English speaking patient 07/18/2016  . COPD, group B, by GOLD 2017 classification (HCC)   . Nausea, vomiting and diarrhea 10/03/2015  . CKD (chronic kidney disease), stage III 07/27/2015  . Carotid artery disease (HCC) 07/27/2015  . Hyponatremia 06/18/2014  . Paroxysmal atrial fibrillation (HCC) 11/16/2013  . Ischemic cardiomyopathy 11/16/2013  . Headache 11/16/2013  . Chronic anticoagulation 11/16/2013  . Neck pain on left side 11/16/2013  . Chest pain at rest 11/16/2013  . Coronary artery disease 10/14/2013  . Chronic systolic heart failure (HCC) 10/14/2013  . AAA (abdominal aortic aneurysm) without rupture (HCC) 09/08/2013  . Hyperlipidemia 06/08/2013  . History  of CVA (cerebrovascular accident) 05/02/2013  . PVD (peripheral vascular disease) 3.5cm AAA Aug 2014 04/29/2013  . NSTEMI - ? type 2 - Troponin 0.63 04/28/2013  . Chronic bilateral lower abdominal pain 02/17/2013  . Hypertension     Past Surgical History:  Procedure Laterality Date  . CAROTID STENT INSERTION  2015  . CAROTID STENT  INSERTION N/A 05/09/2013   Procedure: CAROTID STENT INSERTION;  Surgeon: Runell Gess, MD;  Location: [facility] CATH LAB;  Service: Cardiovascular;  Laterality: N/A;  . ESOPHAGOGASTRODUODENOSCOPY  06/13/2008,12/31/12  . LEFT HEART CATHETERIZATION WITH CORONARY ANGIOGRAM N/A 05/04/2013   Procedure: LEFT HEART CATHETERIZATION WITH CORONARY ANGIOGRAM;  Surgeon: Peter M Swaziland, MD;  Location: [facility] CATH LAB;  Service: Cardiovascular;  Laterality: N/A;       Home Medications    Prior to Admission medications   Medication Sig Start Date End Date Taking? Authorizing Provider  allopurinol (ZYLOPRIM) 300 MG tablet Take 300 mg by mouth daily. To prevent gout    [provider]  arformoterol (BROVANA) 15 MCG/2ML NEBU Take 2 mLs (15 mcg total) by nebulization 2 (two) times daily. 10/10/15   Rhetta Mura, MD  aspirin EC 81 MG tablet Take 1 tablet (81 mg total) by mouth daily. 09/08/13   Marvel Plan, MD  atorvastatin (LIPITOR) 40 MG tablet Take 1 tablet (40 mg total) by mouth daily at 6 PM. 05/11/13   Robbie Lis M, PA-C  budesonide (PULMICORT) 0.5 MG/2ML nebulizer solution Take 2 mLs (0.5 mg total) by nebulization 2 (two) times daily. 10/10/15   Rhetta Mura, MD  carvedilol (COREG) 6.25 MG tablet Take 1 tablet (6.25 mg total) by mouth 2 (two) times daily with a meal. 07/23/16   Runell Gess, MD  dabigatran (PRADAXA) 75 MG CAPS capsule Take 1 capsule (75 mg total) by mouth every 12 (twelve) hours. 10/21/16 11/20/16  Edsel Petrin, DO  esomeprazole (NEXIUM) 40 MG capsule Take 1 capsule (40 mg total) by mouth 2 (two) times daily before a meal. 07/21/16   Johnson, Clanford L, MD  ipratropium-albuterol (DUONEB) 0.5-2.5 (3) MG/3ML SOLN Take 3 mLs by nebulization 2 (two) times daily as needed (for shortness of breath or wheezing).    [provider]  isosorbide mononitrate (IMDUR) 30 MG 24 hr tablet Take 30 mg by mouth once daily Patient taking differently: Take 30 mg by mouth  daily. For control of heart pain 07/31/15   Margarita Grizzle, MD  meclizine (ANTIVERT) 25 MG tablet Take 25 mg by mouth 3 (three) times daily as needed for dizziness.  09/05/16   [provider]  NITROSTAT 0.4 MG SL tablet PLACE 1 TABLET UNDER THE TONGUE EVERY 5 MINUTES FOR 3 DOSES AS NEEDED FOR CHEST PAIN Patient taking differently: PLACE 0.4 MG UNDER THE TONGUE EVERY 5 MINUTES FOR 3 DOSES AS NEEDED FOR CHEST PAIN 10/29/15   Runell Gess, MD  tamsulosin (FLOMAX) 0.4 MG CAPS capsule Take 0.4 mg by mouth daily. To improve bladder function    [provider]    Family History Family History  Problem Relation Age of Onset  . Colon cancer Neg Hx     Social History Social History  Substance Use Topics  . Smoking status: Former Smoker    Quit date: 02/17/2002  . Smokeless tobacco: Never Used     Comment: Quit seven years ago   . Alcohol use No     Allergies   Levofloxacin; Eliquis [apixaban]; Phenazopyridine hcl; Septra [sulfamethoxazole-trimethoprim]; Latex; and Tape  Review of Systems Review of Systems  All systems reviewed and negative, other than as noted in HPI.   Physical Exam Updated Vital Signs BP 127/63 (BP Location: Right Arm)   Pulse 72   Temp 98.6 F (37 C) (Tympanic)   Ht 5' (1.524 m)   Wt 63.5 kg (140 lb)   SpO2 97%   BMI 27.34 kg/m   Physical Exam  Constitutional: He appears well-developed and well-nourished. No distress.  Laying in bed. NAD.  HENT:  Head: Normocephalic and atraumatic.  Eyes: Conjunctivae are normal. Right eye exhibits no discharge. Left eye exhibits no discharge.  Neck: Neck supple.  Cardiovascular: Normal rate, regular rhythm and normal heart sounds.  Exam reveals no gallop and no friction rub.   No murmur heard. Pulmonary/Chest: Effort normal and breath sounds normal. No respiratory distress.  Abdominal: Soft. He exhibits no distension. There is no tenderness.  Musculoskeletal: He exhibits no edema or tenderness.    Neurological: He is alert.  Follows commands via family translating. He doesn't seem to have difficulty understanding them. Smile symmetric. Tongue midline. EOM function intact. PERRL. Strength 5/5 b/l u/l ext.   Skin: Skin is warm and dry.  Psychiatric: He has a normal mood and affect. His behavior is normal. Thought content normal.  Nursing note and vitals reviewed.    ED Treatments / Results  Labs (all labs ordered are listed, but only abnormal results are displayed) Labs Reviewed  CBC WITH DIFFERENTIAL/PLATELET - Abnormal; Notable for the following:       Result Value   RBC 2.63 (*)    Hemoglobin 8.5 (*)    HCT 24.9 (*)    All other components within normal limits  BASIC METABOLIC PANEL - Abnormal; Notable for the following:    Sodium 131 (*)    CO2 21 (*)    Glucose, Bld 128 (*)    Creatinine, Ser 1.73 (*)    Calcium 8.5 (*)    GFR calc non Af Amer 33 (*)    GFR calc Af Amer 39 (*)    All other components within normal limits    EKG  EKG Interpretation  Date/Time:  Tuesday October 21 2016 17:18:41 EDT Ventricular Rate:  77 PR Interval:    QRS Duration: 113 QT Interval:  479 QTC Calculation: 543 R Axis:   -5 Text Interpretation:  Atrial fibrillation Borderline intraventricular conduction delay Low voltage, extremity leads Minimal ST depression, lateral leads Prolonged QT interval Confirmed by Raeford Razor [phone]) on 10/21/2016 6:29:15 PM Also confirmed by Raeford Razor [phone]), editor Barbette Hair [phone])  on 10/22/2016 8:17:20 AM       Radiology Dg Chest 2 View  Result Date: 10/20/2016 CLINICAL DATA:  Chest pain and shortness of breath tonight. History of CHF. EXAM: CHEST  2 VIEW COMPARISON:  Chest radiograph October 10, 2016 FINDINGS: Cardiac silhouette is moderately enlarged and unchanged. Calcified aortic knob. Chronic interstitial changes and hyperinflation without pleural effusion or focal consolidation. LEFT upper lung zone scarring. No pneumothorax.  Soft tissue planes included osseous structures are nonsuspicious. IMPRESSION: Stable cardiomegaly. Similar hyperinflation and chronic interstitial changes. Aortic Atherosclerosis (ICD10-I70.0). Electronically Signed   By: Awilda Metro M.D.   On: 10/20/2016 05:48   Ct Head Wo Contrast  Result Date: 10/21/2016 CLINICAL DATA:  Altered level of consciousness EXAM: CT HEAD WITHOUT CONTRAST TECHNIQUE: Contiguous axial images were obtained from the base of the skull through the vertex without intravenous contrast. COMPARISON:  MRI and head CT from 10/18/2016 FINDINGS:  Brain: Superimposed on chronic moderate small vessel ischemic disease of the deep white matter are recent infarcts involving the left parieto-occipital lobe, portions of the left thalamus, medial left parietal lobe and left frontal lobe better visualized prior recent MRI. Right posterior limb of the internal capsule recent infarct is also stable. No acute intracranial hemorrhage, intra-axial mass, edema or midline shift. Mild superficial and central atrophy. Bifrontal subdural hygromas left-greater-than-right are stable. Vascular: No hyperdense vessels. Mild-to-moderate atherosclerosis of the carotid siphons. Skull: No acute fracture or suspicious osseous lesions. Chronic right zygomatic arch fracture. Sinuses/Orbits: Bilateral lens replacements surgeries. No acute sinus disease. Small right mastoid effusion. Clear left mastoids. Other: None IMPRESSION: 1. Chronic moderate small vessel ischemic disease of deep white matter. 2. Re- demonstration of recent infarcts involving the left parieto-occipital lobe, portions of the left thalamus, left medial parietal left frontal lobes. Recent right posterior limb of the internal capsule infarct is also stable. 3. Stable bilateral subdural hygromas. 4. No new findings since prior comparisons. Electronically Signed   By: Tollie Eth M.D.   On: 10/21/2016 19:33   Dg Swallowing Func-speech Pathology  Result  Date: 10/20/2016 Objective Swallowing Evaluation: Type of Study: MBS-Modified Barium Swallow Study Patient Details Name: Caedmon Novakovic MRN: [id number] Date of Birth: [birth year] Today's Date: 10/20/2016 Time: SLP Start Time (ACUTE ONLY): 1440-SLP Stop Time (ACUTE ONLY): 1455 SLP Time Calculation (min) (ACUTE ONLY): 15 min Past Medical History: Past Medical History: Diagnosis Date . AAA (abdominal aortic aneurysm) (HCC)  . Anemia  . Asthma  . Carotid artery disease (HCC)  . CHF (congestive heart failure) (HCC)  . Coronary artery disease  . Depression  . Gout  . H. pylori infection  . Hard of hearing  . Hiatal hernia  . Hyperplasia, prostate  . Hypertension  . PUD (peptic ulcer disease)  . Renal failure, acute (HCC) 11/08/2012 Past Surgical History: Past Surgical History: Procedure Laterality Date . CAROTID STENT INSERTION  2015 . CAROTID STENT INSERTION N/A 05/09/2013  Procedure: CAROTID STENT INSERTION;  Surgeon: Runell Gess, MD;  Location: [facility] CATH LAB;  Service: Cardiovascular;  Laterality: N/A; . ESOPHAGOGASTRODUODENOSCOPY  06/13/2008,12/31/12 . LEFT HEART CATHETERIZATION WITH CORONARY ANGIOGRAM N/A 05/04/2013  Procedure: LEFT HEART CATHETERIZATION WITH CORONARY ANGIOGRAM;  Surgeon: Peter M Swaziland, MD;  Location: [facility] CATH LAB;  Service: Cardiovascular;  Laterality: N/A; HPI: Budd Khamjumphonis a 83 y.o.malewith a history of atrial fibrillation with chads 2 vascular score of 6, CAD, history of multiple strokes on anticoagulation, with his most recent stroke at the end of August 2018 while on Pradaxa. History is obtained by family members as patient is poor historian. Patient was last seen normal yesterday morning. The patient became gradually more weak.  This morning, the patient woke up and appeared more confused, unable to recognize family members, slurred speech, weakness on his right side, preference of leftward gaze. Patient was brought to the emergency department by private vehicle. He was not considered  a TPA candidate due to being on Pradaxa. Since being in the emergency department, his symptoms have been improving.  MRI is showing left occipital CVA with watershed area of left frontal and parietal lobes as well as left superior thalamus.   Subjective: The patient was seen sitting upright in bed with family present.  Assessment / Plan / Recommendation CHL IP CLINICAL IMPRESSIONS 10/20/2016 Clinical Impression Patient presents with a functional oropharyngeal swallow. Oral phase milldy delayed with peicemeal mastication/transit of bolus, likely a result of CVAs, cognitive deficits, and missing dentition (dentures  at home). Trace vallecular and pyriform sinus coating noted post swallow, tight appearing CP may be presenting mild pressure changes during the swallow. Patient fully clears pharynx however with subsequent swallows. Full airway protection noted. No SLP f/u indicated for dysphagia.  SLP Visit Diagnosis Dysphagia, pharyngoesophageal phase (R13.14) Attention and concentration deficit following -- Frontal lobe and executive function deficit following -- Impact on safety and function Mild aspiration risk   CHL IP TREATMENT RECOMMENDATION 10/20/2016 Treatment Recommendations No treatment recommended at this time   Prognosis 10/19/2016 Prognosis for Safe Diet Advancement Fair Barriers to Reach Goals -- Barriers/Prognosis Comment -- CHL IP DIET RECOMMENDATION 10/20/2016 SLP Diet Recommendations Dysphagia 2 (Fine chop) solids;Thin liquid Liquid Administration via Cup;Straw Medication Administration Whole meds with liquid Compensations Slow rate;Small sips/bites Postural Changes Seated upright at 90 degrees   CHL IP OTHER RECOMMENDATIONS 10/20/2016 Recommended Consults -- Oral Care Recommendations Oral care BID Other Recommendations --   CHL IP FOLLOW UP RECOMMENDATIONS 10/20/2016 Follow up Recommendations None   CHL IP FREQUENCY AND DURATION 10/20/2016 Speech Therapy Frequency (ACUTE ONLY) min 2x/week Treatment Duration --       CHL IP ORAL PHASE 10/20/2016 Oral Phase Impaired Oral - Pudding Teaspoon -- Oral - Pudding Cup -- Oral - Honey Teaspoon -- Oral - Honey Cup -- Oral - Nectar Teaspoon -- Oral - Nectar Cup -- Oral - Nectar Straw -- Oral - Thin Teaspoon -- Oral - Thin Cup Piecemeal swallowing Oral - Thin Straw Piecemeal swallowing Oral - Puree Delayed oral transit;Piecemeal swallowing Oral - Mech Soft -- Oral - Regular Delayed oral transit;Piecemeal swallowing Oral - Multi-Consistency -- Oral - Pill -- Oral Phase - Comment --  CHL IP PHARYNGEAL PHASE 10/20/2016 Pharyngeal Phase WFL Pharyngeal- Pudding Teaspoon -- Pharyngeal -- Pharyngeal- Pudding Cup -- Pharyngeal -- Pharyngeal- Honey Teaspoon -- Pharyngeal -- Pharyngeal- Honey Cup -- Pharyngeal -- Pharyngeal- Nectar Teaspoon -- Pharyngeal -- Pharyngeal- Nectar Cup -- Pharyngeal -- Pharyngeal- Nectar Straw -- Pharyngeal -- Pharyngeal- Thin Teaspoon -- Pharyngeal -- Pharyngeal- Thin Cup -- Pharyngeal -- Pharyngeal- Thin Straw -- Pharyngeal -- Pharyngeal- Puree -- Pharyngeal -- Pharyngeal- Mechanical Soft -- Pharyngeal -- Pharyngeal- Regular -- Pharyngeal -- Pharyngeal- Multi-consistency -- Pharyngeal -- Pharyngeal- Pill -- Pharyngeal -- Pharyngeal Comment --  CHL IP CERVICAL ESOPHAGEAL PHASE 10/20/2016 Cervical Esophageal Phase (No Data) Pudding Teaspoon -- Pudding Cup -- Honey Teaspoon -- Honey Cup -- Nectar Teaspoon -- Nectar Cup -- Nectar Straw -- Thin Teaspoon -- Thin Cup -- Thin Straw -- Puree -- Mechanical Soft -- Regular -- Multi-consistency -- Pill -- Cervical Esophageal Comment -- CHL IP GO 07/18/2016 Functional Assessment Tool Used NOMS; clinical judgment Functional Limitations Other Speech Language Pathology Swallow Current Status [phone]) CI Swallow Goal Status (U0454) CI Swallow Discharge Status [phone]) (None) Motor Speech Current Status [phone]) (None) Motor Speech Goal Status (G9562) (None) Motor Speech Goal Status (Z3086) (None) Spoken Language Comprehension Current Status  (V7846) (None) Spoken Language Comprehension Goal Status (N6295) (None) Spoken Language Comprehension Discharge Status [phone]) (None) Spoken Language Expression Current Status (K4401) (None) Spoken Language Expression Goal Status (U2725) (None) Spoken Language Expression Discharge Status [phone]) (None) Attention Current Status (I3474) (None) Attention Goal Status (Q5956) (None) Attention Discharge Status (L8756) (None) Memory Current Status (E3329) (None) Memory Goal Status (J1884) (None) Memory Discharge Status (Z6606) (None) Voice Current Status (T0160) (None) Voice Goal Status (F0932) (None) Voice Discharge Status (T5573) (None) Other Speech-Language Pathology Functional Limitation Current Status (U2025) CJ Other Speech-Language Pathology Functional Limitation Goal Status (K2706) CI Other Speech-Language Pathology  Functional Limitation Discharge Status [phone]) (None) [name] Lango MA, CCC-SLP [phone] [name] Lango Meryl 10/20/2016, 3:02 PM               Procedures Procedures (including critical care time)  Medications Ordered in ED Medications - No data to display   Initial Impression / Assessment and Plan / ED Course  I have reviewed the triage vital signs and the nursing notes.  Pertinent labs & imaging results that were available during my care of the patient were reviewed by me and considered in my medical decision making (see chart for details).     89yM with change in mental status. Recent admit with CVA as noted. CT today w/o acute findings/hemorrhagic conversion. With recent admission/work-up I do not think there is much utility in re-admission at this time. FU discussed with family and pt with them translating.   Final Clinical Impressions(s) / ED Diagnoses   Final diagnoses:  Altered mental status, unspecified altered mental status type    New Prescriptions New Prescriptions   No medications on file     Raeford Razor, MD 11/07/16 1321

## 2016-10-22 ENCOUNTER — Other Ambulatory Visit: Payer: Self-pay

## 2016-10-22 NOTE — Patient Outreach (Signed)
[facility]) Care Management  10/22/2016  [name] May 09, 1926 [id number]  EMMI: stroke Referral date: 10/21/16 Referral source: EMMI stroke RED alert Referral reason: Feeling worse overall: YES, Has been around smoke: YES Day # 6  Telephone cal to patients caregiver daughter, [name].  HIPAA verified for patient by daughter. Daughter states patient was taken to the emergency room on yesterday due to confusion and overall not feeling well.  Daughter states patient had CT scan.  States there were no new findings and patient was discharged home. Daughter states [facility] nurse saw patient today. Daughter states patient continues to feel tired and a little dizzy. Daughter states patient is doing a little better this afternoon and is currently sitting on the couch. Daughter states patient is aware of who the family is  In the house.  Daughter states  She has scheduled patient a follow up appointment with his primary MD on  Friday 10/24/16.  Daughter states she is unsure of the time. Daughter states patient is scheduled to see Dr. Pearlean Brownie on 12/01/16 and Dr. Jeri Cos on 12/02/16.  RNCM advised patient to notify MD of any changes in condition prior to scheduled appointment. RNCM verified patient aware of 911 services for urgent/ emergent needs.  PLAN: RNCM will refer patient to care management assistant to close due to patient being assessed and having no further needs.   [name] RN,BSN,CCM [facility] Telephonic  [phone]

## 2016-11-05 ENCOUNTER — Ambulatory Visit (INDEPENDENT_AMBULATORY_CARE_PROVIDER_SITE_OTHER): Payer: Medicare Other | Admitting: Neurology

## 2016-11-05 ENCOUNTER — Encounter: Payer: Self-pay | Admitting: Neurology

## 2016-11-05 VITALS — BP 157/73 | HR 73 | Wt 143.0 lb

## 2016-11-05 DIAGNOSIS — F0150 Vascular dementia without behavioral disturbance: Secondary | ICD-10-CM | POA: Diagnosis not present

## 2016-11-05 DIAGNOSIS — I63432 Cerebral infarction due to embolism of left posterior cerebral artery: Secondary | ICD-10-CM

## 2016-11-05 NOTE — Patient Instructions (Signed)
I had a long discussion with the patient and his daughter using a [place] language interpreter about his recent embolic stroke and cognitive changes since then. I recommend checking lab work, urinalysis and EEG to look for reversible etiologies for his significant cognitive decline. Continue Xarelto for stroke prevention and strict control of hypertension with blood pressure goal below 130/90, lipids with LDL cholesterol goal below 70 mg percent. Continue to use a walker at all times for fall and safety and home physical occupational and speech therapy. He will follow-up in the future with  My nurse practitioner in a month or call earlier if necessary

## 2016-11-05 NOTE — Progress Notes (Signed)
[facility] [address] [place]. [place] [address] [phone]       OFFICE FOLLOW-UP NOTE  Jonathon. [name] Date of Birth:  20-Apr-1926 Medical Record Number:  [id number]   HPI: Initial visit 09/12/2016 : [name] is a 83 year male from [place] originally who is seen today for first office follow-up visit following hospital admission for stroke in June 2018. He is accompanied by his daughter as well as a [place] language interpreter who provide most of the history. I also reviewed electronic medical records and personally reviewed imaging films. [name] is a 83 y.o. male with multiple medical problems including atrial fibrillation on Xarelto who presents with left-sided weakness and decreased responsiveness. He went to bed normal at 10 PM, as wife reports that when he woke up he was unable to lift "either arm." His brought into the emergency room where he was activated as code stroke. On my exam, he was able to hold his right arm up without difficulty but his left arm had significant weakness. He was taken for a stat CT perfusion which did not show any ischemic penumbra, and CT angiogram which shows right M1 stenosis without occlusion. MRI scan of the brain showed a small region of acute infarction affecting the gyral surface of the precentral gyrus with extensive chronic small vessel ischemic changes as well as old hemosiderin deposition in the right lateral thalamus posterior limb internal capsule and left frontal cortical subcortical region. Transthoracic echo showed ejection fraction 45-50% with moderate pulmonary hypertension. LDL cholesterol is 42 mg percent. Hemoglobin A1c was 6.4. Patient was previously on Xarelto for atrial fibrillation this was switched to Pradaxa. Patient is doing well. Is staying at home with his family. He is able to ambulate independently. The family feels that he is recovered completely back to his baseline. Patient is quite active  and eats healthy. His blood pressure is well controlled today it is 138/81. He is tolerating Lipitor well without muscle aches and pains. He in fact has no complaints today when asked through the interpreter. Update 11/05/16 : Patient is seen urgently today upon request from the daughter and primary physician. Patient was readmitted to the hospital on 10/08/16 with increased weakness and confusion. I have personally reviewed imaging for lemons in hospital workup. MRI scan of the brain showed a new 1 cm right posterior limb internal capsule infarct. CT angiogram of the head and neck showed 75% right middle cerebral artery stenosis and 70% stenosis in the left ICA stent. Transthoracic echo showed slightly diminished ejection fraction of 45-50%. LDL cholesterol 50 mg percent and hemoglobin A1c was 6.4. Patient was continued on his Pradaxa and aspirin 81 mg and discharged home with home therapies. The patient did not do well and while working with physical therapy and 10/18/16 was found to be more weak and not able to talk and speech was slurred. He was readmitted an MRI scan of the brain on 10/18/16 which showed a new acute infarct in the left parieto-occipital cortex as well as some watershed areas of acute infarct left frontal and parietal lobes as well as left superior thalamus. The previously seen subacute right posterior limb internal capsule infarct is unchanged. CT perfusion scan did not show significant salvageable penumbra tissue. Patient had mild hyponatremia. Patient has since been discharged and was sent home and getting home physical occupational therapy. The daughter feels that his dizzy and does not get up and walk a lot. He is tired and fatigued and quite  sleepy and difficult to arouse. His primary physician is changes Pradaxa to Xarelto. Patient has not been eating and drinking very well as well. The daughter feels that he is more confused and does not speak as much as he used to prior to his latest  stroke. ROS:   14 system review of systems is positive for  fatigue, hearing loss, ear discharge, loss of vision, double vision, nausea, frequent waking, dizziness, headache, speech difficulty, confusion  and all other systems negative  PMH:  Past Medical History:  Diagnosis Date  . AAA (abdominal aortic aneurysm) (HCC)   . Anemia   . Asthma   . Carotid artery disease (HCC)   . CHF (congestive heart failure) (HCC)   . Coronary artery disease   . Depression   . Gout   . H. pylori infection   . Hard of hearing   . Hiatal hernia   . Hyperplasia, prostate   . Hypertension   . PUD (peptic ulcer disease)   . Renal failure, acute (HCC) 11/08/2012    Social History:  Social History   Social History  . Marital status: Married    Spouse name: san  . Number of children: 13  . Years of education: 4th grade   Occupational History  . retired    Social History Main Topics  . Smoking status: Former Smoker    Quit date: 02/17/2002  . Smokeless tobacco: Never Used     Comment: Quit seven years ago   . Alcohol use No  . Drug use: No  . Sexual activity: No   Other Topics Concern  . Not on file   Social History Narrative   Patient lives at home with his daughter   Patient is right handed    Patient drinks tea and coffee    Medications:   Current Outpatient Prescriptions on File Prior to Visit  Medication Sig Dispense Refill  . allopurinol (ZYLOPRIM) 300 MG tablet Take 300 mg by mouth daily. To prevent gout    . arformoterol (BROVANA) 15 MCG/2ML NEBU Take 2 mLs (15 mcg total) by nebulization 2 (two) times daily. 120 mL 2  . aspirin EC 81 MG tablet Take 1 tablet (81 mg total) by mouth daily. 90 tablet 3  . atorvastatin (LIPITOR) 40 MG tablet Take 1 tablet (40 mg total) by mouth daily at 6 PM. 30 tablet 5  . budesonide (PULMICORT) 0.5 MG/2ML nebulizer solution Take 2 mLs (0.5 mg total) by nebulization 2 (two) times daily. 2 mL 12  . carvedilol (COREG) 6.25 MG tablet Take 1 tablet  (6.25 mg total) by mouth 2 (two) times daily with a meal. 60 tablet 6  . dabigatran (PRADAXA) 75 MG CAPS capsule Take 1 capsule (75 mg total) by mouth every 12 (twelve) hours. 60 capsule 0  . esomeprazole (NEXIUM) 40 MG capsule Take 1 capsule (40 mg total) by mouth 2 (two) times daily before a meal.    . ipratropium-albuterol (DUONEB) 0.5-2.5 (3) MG/3ML SOLN Take 3 mLs by nebulization 2 (two) times daily as needed (for shortness of breath or wheezing).    . isosorbide mononitrate (IMDUR) 30 MG 24 hr tablet Take 30 mg by mouth once daily (Patient taking differently: Take 30 mg by mouth daily. For control of heart pain) 15 tablet 9  . meclizine (ANTIVERT) 25 MG tablet Take 25 mg by mouth 3 (three) times daily as needed for dizziness.     [name] NITROSTAT 0.4 MG SL tablet PLACE 1 TABLET UNDER  THE TONGUE EVERY 5 MINUTES FOR 3 DOSES AS NEEDED FOR CHEST PAIN (Patient taking differently: PLACE 0.4 MG UNDER THE TONGUE EVERY 5 MINUTES FOR 3 DOSES AS NEEDED FOR CHEST PAIN) 25 tablet 5  . tamsulosin (FLOMAX) 0.4 MG CAPS capsule Take 0.4 mg by mouth daily. To improve bladder function    . [DISCONTINUED] apixaban (ELIQUIS) 2.5 MG TABS tablet Take 1 tablet (2.5 mg total) by mouth 2 (two) times daily. 60 tablet 3  . [DISCONTINUED] pantoprazole (PROTONIX) 40 MG tablet Take 1 tablet (40 mg total) by mouth 2 (two) times daily. 60 tablet 5   No current facility-administered medications on file prior to visit.     Allergies:   Allergies  Allergen Reactions  . Levofloxacin Anaphylaxis and Other (See Comments)    Patient told his daughter that it made him "feel worse than he already did" (overall) Headache also (has refused to take anymore)  . Eliquis [Apixaban] Itching and Swelling  . Phenazopyridine Hcl Other (See Comments)    Unknown reaction  . Septra [Sulfamethoxazole-Trimethoprim] Nausea And Vomiting and Other (See Comments)    GI Upset  . Latex Rash  . Tape Rash    Physical Exam General: Frail cachectic  looking elderly Asian male, seated, in no evident distress Head: head normocephalic and atraumatic.  Neck: supple with no carotid or supraclavicular bruits Cardiovascular: regular rate and rhythm, no murmurs Musculoskeletal: no deformity Skin:  no rash/petichiae Vascular:  Normal pulses all extremities Vitals:   11/05/16 0957  BP: (!) 157/73  Pulse: 73   Neurologic Exam Mental Status: Awake and fully alert. Mental status exam Limited due to language barrier but follows commands appropriately and speaks in his own language But only a few words and short sentences and suspect mild expressive aphasia  Cranial Nerves: Fundoscopic exam reveals sharp disc margins. Pupils equal, briskly reactive to light. Extraocular movements full without nystagmus. Visual fields show decreased blink to threat on the right to confrontation. Hearing appears slightly diminished.. Facial sensation intact. Face, tongue, palate moves normally and symmetrically.  Motor: Normal bulk and tone. Normal strength in all tested extremity muscles. Sensory.: intact to touch ,pinprick .position and vibratory sensation.  Coordination: Rapid alternating movements normal in all extremities. Finger-to-nose and heel-to-shin performed accurately bilaterally. Gait and Station: Arises from chair without difficulty. Stance is slightly stooped. Gait using a wheeled walker demonstrates normal stride length and balance . Not able to heel, toe and tandem walk      Reflexes: 1+ and symmetric. Toes downgoing.      ASSESSMENT: 83 year old Asian male with embolic right frontal MCA branch infarct secondary to atrial fibrillation In June 2018 and left occipital and thalamic infarcts in September 2018 despite being on anticoagulation with Xarelto and Pradaxa with 70 %LICA  intrastent restenosis. He seems to have  Now developed significant fatigue and cognitive decline with fluctuating exam possibly vascular dementia but reversible causes need to be  looked for    PLAN: I had a long discussion with the patient and his daughter using a [place] language interpreter about his recent embolic stroke and cognitive changes since then. I recommend checking lab work, urinalysis and EEG to look for reversible etiologies for his significant cognitive decline. Continue Xarelto for stroke prevention and strict control of hypertension with blood pressure goal below 130/90, lipids with LDL cholesterol goal below 70 mg percent. Continue to use a walker at all times for fall and safety and home physical occupational and speech therapy. He will follow-up  in the future with  My nurse practitioner in a month or call earlier if necessary Greater than 50% of time during this 35 minute visit was spent on counseling,explanation of diagnosis of embolic stroke, atrial fibrillation, planning of further management, discussion with patient and family and coordination of care Delia Heady, MD  [facility] [address] [place], [place] [address]  Phone [phone] Fax [phone] Note: This document was prepared with digital dictation and possible smart phrase technology. Any transcriptional errors that result from this process are unintentional

## 2016-11-06 LAB — DEMENTIA PANEL
Homocysteine: 14.2 umol/L (ref 0.0–15.0)
RPR Ser Ql: NONREACTIVE
TSH: 1.36 u[IU]/mL (ref 0.450–4.500)
Vitamin B-12: 747 pg/mL (ref 232–1245)

## 2016-11-06 LAB — COMPREHENSIVE METABOLIC PANEL
ALBUMIN: 3.8 g/dL (ref 3.5–4.7)
ALT: 10 IU/L (ref 0–44)
AST: 26 IU/L (ref 0–40)
Albumin/Globulin Ratio: 1 — ABNORMAL LOW (ref 1.2–2.2)
Alkaline Phosphatase: 93 IU/L (ref 39–117)
BUN/Creatinine Ratio: 7 — ABNORMAL LOW (ref 10–24)
BUN: 9 mg/dL (ref 8–27)
Bilirubin Total: 1 mg/dL (ref 0.0–1.2)
CALCIUM: 8.8 mg/dL (ref 8.6–10.2)
CHLORIDE: 87 mmol/L — AB (ref 96–106)
CO2: 22 mmol/L (ref 20–29)
Creatinine, Ser: 1.34 mg/dL — ABNORMAL HIGH (ref 0.76–1.27)
GFR calc Af Amer: 54 mL/min/{1.73_m2} — ABNORMAL LOW (ref >59)
GFR, EST NON AFRICAN AMERICAN: 47 mL/min/{1.73_m2} — AB (ref >59)
GLOBULIN, TOTAL: 3.8 g/dL (ref 1.5–4.5)
Glucose: 92 mg/dL (ref 65–99)
POTASSIUM: 4.8 mmol/L (ref 3.5–5.2)
Sodium: 122 mmol/L — ABNORMAL LOW (ref 134–144)
Total Protein: 7.6 g/dL (ref 6.0–8.5)

## 2016-11-06 LAB — CBC
HEMOGLOBIN: 10 g/dL — AB (ref 13.0–17.7)
Hematocrit: 29.4 % — ABNORMAL LOW (ref 37.5–51.0)
MCH: 31.7 pg (ref 26.6–33.0)
MCHC: 34 g/dL (ref 31.5–35.7)
MCV: 93 fL (ref 79–97)
Platelets: 197 10*3/uL (ref 150–379)
RBC: 3.15 x10E6/uL — AB (ref 4.14–5.80)
RDW: 15.6 % — ABNORMAL HIGH (ref 12.3–15.4)
WBC: 7.6 10*3/uL (ref 3.4–10.8)

## 2016-11-06 LAB — URINALYSIS, ROUTINE W REFLEX MICROSCOPIC
BILIRUBIN UA: NEGATIVE
Glucose, UA: NEGATIVE
KETONES UA: NEGATIVE
Leukocytes, UA: NEGATIVE
NITRITE UA: NEGATIVE
Protein, UA: NEGATIVE
RBC UA: NEGATIVE
Specific Gravity, UA: 1.015 (ref 1.005–1.030)
Urobilinogen, Ur: 0.2 mg/dL (ref 0.2–1.0)
pH, UA: 7 (ref 5.0–7.5)

## 2016-11-14 NOTE — Progress Notes (Signed)
PT Evaluation Addendum Late entry for missed G-code on 10/09/16 Based on review of documentation and goals    10/09/16 1241  PT Time Calculation  PT Start Time (ACUTE ONLY) 1027  PT Stop Time (ACUTE ONLY) 1050  PT Time Calculation (min) (ACUTE ONLY) 23 min  PT G-Codes **NOT FOR INPATIENT CLASS**  Functional Assessment Tool Used AM-PAC 6 Clicks Basic Mobility  Functional Limitation Mobility: Walking and moving around  Mobility: Walking and Moving Around Current Status (I9518) CK  Mobility: Walking and Moving Around Goal Status (A4166) CI  PT General Charges  $$ ACUTE PT VISIT 1 Visit  PT Evaluation  $PT Eval Moderate Complexity 1 Mod  PT Treatments  $Therapeutic Activity 8-22 mins  11/14/2016 [place], PT [phone]

## 2016-11-19 ENCOUNTER — Telehealth: Payer: Self-pay

## 2016-11-19 ENCOUNTER — Ambulatory Visit (INDEPENDENT_AMBULATORY_CARE_PROVIDER_SITE_OTHER): Payer: Medicare Other

## 2016-11-19 DIAGNOSIS — R410 Disorientation, unspecified: Secondary | ICD-10-CM

## 2016-11-19 DIAGNOSIS — F0150 Vascular dementia without behavioral disturbance: Secondary | ICD-10-CM

## 2016-11-19 NOTE — Telephone Encounter (Signed)
-----   Message from Micki Riley, MD sent at 11/15/2016 11:23 AM EDT ----- Joneen Roach inform the patient that urine analysis was normal. Serum sodium levels were low and mild anemia for which he needs to see his primary care physician Dr Benedetto Goad for further evaluation. Blood work for reversible causes of memory loss were normal

## 2016-11-19 NOTE — Telephone Encounter (Signed)
Left vm for patients daughter on dpr to call back about lab results. ------

## 2016-11-25 ENCOUNTER — Telehealth: Payer: Self-pay

## 2016-11-25 NOTE — Telephone Encounter (Signed)
Rn call patients daughter that the serum sodium levels were low, and he is mild anemia. Dr. Pearlean Brownie wants him to see Dr.Wilson for further evaluation. Urine analysis were normal. Blood work for memory loss was normal. [name] will call Dr. Andrey Campanile for an appt to have his low level labs evaluated . Pts daughter verbalized understanding. ------ ------

## 2016-11-25 NOTE — Telephone Encounter (Signed)
Lab work fax to PCP at [phone]. Pt has low sodium levels,and mildly anemia. Labs routed to Dr.Wilson, and fax by paper via fax. Fax was done twice and confirmed.

## 2016-11-25 NOTE — Telephone Encounter (Signed)
Pt daughter has called u back for results, please call back

## 2016-11-26 ENCOUNTER — Inpatient Hospital Stay (HOSPITAL_COMMUNITY)
Admission: EM | Admit: 2016-11-26 | Discharge: 2016-12-03 | DRG: 023 | Disposition: A | Discharge status: Rehabilitation Facility | Payer: Medicare Other | Attending: Internal Medicine | Admitting: Internal Medicine

## 2016-11-26 ENCOUNTER — Encounter (HOSPITAL_COMMUNITY): Payer: Self-pay

## 2016-11-26 ENCOUNTER — Telehealth: Payer: Self-pay | Admitting: Neurology

## 2016-11-26 ENCOUNTER — Emergency Department (HOSPITAL_COMMUNITY): Payer: Medicare Other

## 2016-11-26 DIAGNOSIS — J441 Chronic obstructive pulmonary disease with (acute) exacerbation: Secondary | ICD-10-CM | POA: Diagnosis not present

## 2016-11-26 DIAGNOSIS — Z95828 Presence of other vascular implants and grafts: Secondary | ICD-10-CM

## 2016-11-26 DIAGNOSIS — Z7951 Long term (current) use of inhaled steroids: Secondary | ICD-10-CM

## 2016-11-26 DIAGNOSIS — Z7982 Long term (current) use of aspirin: Secondary | ICD-10-CM

## 2016-11-26 DIAGNOSIS — J9600 Acute respiratory failure, unspecified whether with hypoxia or hypercapnia: Secondary | ICD-10-CM

## 2016-11-26 DIAGNOSIS — I480 Paroxysmal atrial fibrillation: Secondary | ICD-10-CM

## 2016-11-26 DIAGNOSIS — E785 Hyperlipidemia, unspecified: Secondary | ICD-10-CM | POA: Diagnosis present

## 2016-11-26 DIAGNOSIS — N183 Chronic kidney disease, stage 3 unspecified: Secondary | ICD-10-CM

## 2016-11-26 DIAGNOSIS — Z7901 Long term (current) use of anticoagulants: Secondary | ICD-10-CM

## 2016-11-26 DIAGNOSIS — I63412 Cerebral infarction due to embolism of left middle cerebral artery: Principal | ICD-10-CM | POA: Diagnosis present

## 2016-11-26 DIAGNOSIS — E874 Mixed disorder of acid-base balance: Secondary | ICD-10-CM | POA: Diagnosis present

## 2016-11-26 DIAGNOSIS — Z91048 Other nonmedicinal substance allergy status: Secondary | ICD-10-CM

## 2016-11-26 DIAGNOSIS — J189 Pneumonia, unspecified organism: Secondary | ICD-10-CM

## 2016-11-26 DIAGNOSIS — Z87891 Personal history of nicotine dependence: Secondary | ICD-10-CM

## 2016-11-26 DIAGNOSIS — H9190 Unspecified hearing loss, unspecified ear: Secondary | ICD-10-CM | POA: Diagnosis present

## 2016-11-26 DIAGNOSIS — Z8673 Personal history of transient ischemic attack (TIA), and cerebral infarction without residual deficits: Secondary | ICD-10-CM

## 2016-11-26 DIAGNOSIS — I4819 Other persistent atrial fibrillation: Secondary | ICD-10-CM

## 2016-11-26 DIAGNOSIS — I130 Hypertensive heart and chronic kidney disease with heart failure and stage 1 through stage 4 chronic kidney disease, or unspecified chronic kidney disease: Secondary | ICD-10-CM | POA: Diagnosis present

## 2016-11-26 DIAGNOSIS — Z79899 Other long term (current) drug therapy: Secondary | ICD-10-CM

## 2016-11-26 DIAGNOSIS — J9601 Acute respiratory failure with hypoxia: Secondary | ICD-10-CM

## 2016-11-26 DIAGNOSIS — I6330 Cerebral infarction due to thrombosis of unspecified cerebral artery: Secondary | ICD-10-CM

## 2016-11-26 DIAGNOSIS — J690 Pneumonitis due to inhalation of food and vomit: Secondary | ICD-10-CM | POA: Diagnosis not present

## 2016-11-26 DIAGNOSIS — R4701 Aphasia: Secondary | ICD-10-CM

## 2016-11-26 DIAGNOSIS — E119 Type 2 diabetes mellitus without complications: Secondary | ICD-10-CM

## 2016-11-26 DIAGNOSIS — I69391 Dysphagia following cerebral infarction: Secondary | ICD-10-CM

## 2016-11-26 DIAGNOSIS — E876 Hypokalemia: Secondary | ICD-10-CM | POA: Diagnosis not present

## 2016-11-26 DIAGNOSIS — I2510 Atherosclerotic heart disease of native coronary artery without angina pectoris: Secondary | ICD-10-CM | POA: Diagnosis present

## 2016-11-26 DIAGNOSIS — Z91040 Latex allergy status: Secondary | ICD-10-CM

## 2016-11-26 DIAGNOSIS — N179 Acute kidney failure, unspecified: Secondary | ICD-10-CM | POA: Diagnosis not present

## 2016-11-26 DIAGNOSIS — Z8739 Personal history of other diseases of the musculoskeletal system and connective tissue: Secondary | ICD-10-CM

## 2016-11-26 DIAGNOSIS — N400 Enlarged prostate without lower urinary tract symptoms: Secondary | ICD-10-CM | POA: Diagnosis present

## 2016-11-26 DIAGNOSIS — R29717 NIHSS score 17: Secondary | ICD-10-CM | POA: Diagnosis present

## 2016-11-26 DIAGNOSIS — E1122 Type 2 diabetes mellitus with diabetic chronic kidney disease: Secondary | ICD-10-CM | POA: Diagnosis present

## 2016-11-26 DIAGNOSIS — I447 Left bundle-branch block, unspecified: Secondary | ICD-10-CM | POA: Diagnosis present

## 2016-11-26 DIAGNOSIS — D62 Acute posthemorrhagic anemia: Secondary | ICD-10-CM | POA: Diagnosis not present

## 2016-11-26 DIAGNOSIS — I10 Essential (primary) hypertension: Secondary | ICD-10-CM

## 2016-11-26 DIAGNOSIS — M109 Gout, unspecified: Secondary | ICD-10-CM | POA: Diagnosis present

## 2016-11-26 DIAGNOSIS — A419 Sepsis, unspecified organism: Secondary | ICD-10-CM | POA: Diagnosis not present

## 2016-11-26 DIAGNOSIS — E871 Hypo-osmolality and hyponatremia: Secondary | ICD-10-CM | POA: Diagnosis present

## 2016-11-26 DIAGNOSIS — I619 Nontraumatic intracerebral hemorrhage, unspecified: Secondary | ICD-10-CM | POA: Diagnosis present

## 2016-11-26 DIAGNOSIS — Z881 Allergy status to other antibiotic agents status: Secondary | ICD-10-CM

## 2016-11-26 DIAGNOSIS — I5032 Chronic diastolic (congestive) heart failure: Secondary | ICD-10-CM

## 2016-11-26 DIAGNOSIS — I482 Chronic atrial fibrillation, unspecified: Secondary | ICD-10-CM

## 2016-11-26 DIAGNOSIS — R1310 Dysphagia, unspecified: Secondary | ICD-10-CM | POA: Diagnosis not present

## 2016-11-26 DIAGNOSIS — I6340 Cerebral infarction due to embolism of unspecified cerebral artery: Secondary | ICD-10-CM

## 2016-11-26 DIAGNOSIS — J45909 Unspecified asthma, uncomplicated: Secondary | ICD-10-CM

## 2016-11-26 DIAGNOSIS — R0602 Shortness of breath: Secondary | ICD-10-CM

## 2016-11-26 DIAGNOSIS — I712 Thoracic aortic aneurysm, without rupture: Secondary | ICD-10-CM | POA: Diagnosis present

## 2016-11-26 DIAGNOSIS — I639 Cerebral infarction, unspecified: Secondary | ICD-10-CM

## 2016-11-26 DIAGNOSIS — Z888 Allergy status to other drugs, medicaments and biological substances status: Secondary | ICD-10-CM

## 2016-11-26 DIAGNOSIS — R0682 Tachypnea, not elsewhere classified: Secondary | ICD-10-CM

## 2016-11-26 HISTORY — DX: Type 2 diabetes mellitus without complications: E11.9

## 2016-11-26 LAB — CBC
HCT: 28.7 % — ABNORMAL LOW (ref 39.0–52.0)
Hemoglobin: 9.5 g/dL — ABNORMAL LOW (ref 13.0–17.0)
MCH: 30.5 pg (ref 26.0–34.0)
MCHC: 33.1 g/dL (ref 30.0–36.0)
MCV: 92.3 fL (ref 78.0–100.0)
PLATELETS: 196 10*3/uL (ref 150–400)
RBC: 3.11 MIL/uL — ABNORMAL LOW (ref 4.22–5.81)
RDW: 14.5 % (ref 11.5–15.5)
WBC: 7.4 10*3/uL (ref 4.0–10.5)

## 2016-11-26 LAB — I-STAT TROPONIN, ED: Troponin i, poc: 0 ng/mL (ref 0.00–0.08)

## 2016-11-26 LAB — I-STAT CHEM 8, ED
BUN: 10 mg/dL (ref 6–20)
CALCIUM ION: 1.16 mmol/L (ref 1.15–1.40)
CHLORIDE: 96 mmol/L — AB (ref 101–111)
Creatinine, Ser: 1.4 mg/dL — ABNORMAL HIGH (ref 0.61–1.24)
Glucose, Bld: 125 mg/dL — ABNORMAL HIGH (ref 65–99)
HCT: 29 % — ABNORMAL LOW (ref 39.0–52.0)
Hemoglobin: 9.9 g/dL — ABNORMAL LOW (ref 13.0–17.0)
Potassium: 3.8 mmol/L (ref 3.5–5.1)
SODIUM: 134 mmol/L — AB (ref 135–145)
TCO2: 26 mmol/L (ref 22–32)

## 2016-11-26 LAB — COMPREHENSIVE METABOLIC PANEL
ALK PHOS: 76 U/L (ref 38–126)
ALT: 16 U/L — ABNORMAL LOW (ref 17–63)
ANION GAP: 8 (ref 5–15)
AST: 28 U/L (ref 15–41)
Albumin: 3.3 g/dL — ABNORMAL LOW (ref 3.5–5.0)
BUN: 8 mg/dL (ref 6–20)
CALCIUM: 9 mg/dL (ref 8.9–10.3)
CHLORIDE: 99 mmol/L — AB (ref 101–111)
CO2: 24 mmol/L (ref 22–32)
Creatinine, Ser: 1.37 mg/dL — ABNORMAL HIGH (ref 0.61–1.24)
GFR calc non Af Amer: 44 mL/min — ABNORMAL LOW (ref >60)
GFR, EST AFRICAN AMERICAN: 51 mL/min — AB (ref >60)
Glucose, Bld: 130 mg/dL — ABNORMAL HIGH (ref 65–99)
Potassium: 4.5 mmol/L (ref 3.5–5.1)
SODIUM: 131 mmol/L — AB (ref 135–145)
Total Bilirubin: 1 mg/dL (ref 0.3–1.2)
Total Protein: 7.6 g/dL (ref 6.5–8.1)

## 2016-11-26 LAB — DIFFERENTIAL
BASOS PCT: 0 %
Basophils Absolute: 0 10*3/uL (ref 0.0–0.1)
EOS ABS: 0.2 10*3/uL (ref 0.0–0.7)
EOS PCT: 2 %
Lymphocytes Relative: 17 %
Lymphs Abs: 1.3 10*3/uL (ref 0.7–4.0)
MONO ABS: 0.5 10*3/uL (ref 0.1–1.0)
Monocytes Relative: 6 %
Neutro Abs: 5.5 10*3/uL (ref 1.7–7.7)
Neutrophils Relative %: 75 %

## 2016-11-26 LAB — APTT: aPTT: 70 seconds — ABNORMAL HIGH (ref 24–36)

## 2016-11-26 LAB — PROTIME-INR
INR: 2.75
PROTHROMBIN TIME: 28.9 s — AB (ref 11.4–15.2)

## 2016-11-26 MED ORDER — SODIUM CHLORIDE 0.9 % IV BOLUS (SEPSIS)
1000.0000 mL | Freq: Once | INTRAVENOUS | Status: AC
  Administered 2016-11-26: 1000 mL via INTRAVENOUS

## 2016-11-26 NOTE — ED Triage Notes (Signed)
Pt from home BIB [facility]. Pt family noticed pt has difficulty speaking, and was hard for the family to understand what the pt was talking about. LKW was 1600, family noticed change at 41. No other neuro deficit. Pt has hx of cva last month with right sided deficits and slurred speech. Pt has afib

## 2016-11-26 NOTE — ED Notes (Addendum)
NIH was difficult to do score was based on what he actually did but pt is confused and unable to follow all of the commands while being interpreted.

## 2016-11-26 NOTE — ED Notes (Signed)
Attempted to get blood x2 unable to get able blood from pt.

## 2016-11-26 NOTE — Telephone Encounter (Signed)
Threasa Alpha with [facility] is calling to discuss patient's last office visit to see if there is a change in medication.

## 2016-11-26 NOTE — ED Provider Notes (Signed)
[facility] EMERGENCY DEPARTMENT Provider Note   CSN: [id number] Arrival date & time: 11/26/16  2135    History   Chief Complaint Chief Complaint  Patient presents with  . Aphasia    LEVEL 5 CAVEAT 2/2 ACUITY OF CONDITION  HPI [name] is a 83 y.o. male.  83 year old male with a history of hypertension, AAA, CAD, CHF, PAF on chronic Xarelto, and CVA x 3 since June 2018 presents to the ED with family. Family expresses concern about aphasia. Daughter states that patient was speaking normally and complaining of being tired at approximately 1600. Shortly after this time, patient has stopped speaking altogether. Patient will only communicate that he is "tired" and he denies pain and headache. Family report decreased appetite lately. No fevers. Daughter thinks the patient's R arm may be weaker than baseline. He previously was ambulatory with a cane, but required multi-person assist for transitioning today.      Past Medical History:  Diagnosis Date  . AAA (abdominal aortic aneurysm) (HCC)   . Anemia   . Asthma   . Carotid artery disease (HCC)   . CHF (congestive heart failure) (HCC)   . Coronary artery disease   . Depression   . Gout   . H. pylori infection   . Hard of hearing   . Hiatal hernia   . Hyperplasia, prostate   . Hypertension   . PUD (peptic ulcer disease)   . Renal failure, acute (HCC) 11/08/2012    Patient Active Problem List   Diagnosis Date Noted  . Stenosis of left carotid artery   . Acute ischemic stroke (HCC) 10/18/2016  . Cerebral infarction due to stenosis of right middle cerebral artery (HCC) 10/10/2016  . CVA (cerebral vascular accident) (HCC) 07/18/2016  . Stroke-like symptoms 07/18/2016  . Stroke (HCC) 07/18/2016  . Anemia 07/18/2016  . Non-English speaking patient 07/18/2016  . COPD, group B, by GOLD 2017 classification (HCC)   . Nausea, vomiting and diarrhea 10/03/2015  . CKD (chronic kidney disease), stage III  (HCC) 07/27/2015  . Carotid artery disease (HCC) 07/27/2015  . Hyponatremia 06/18/2014  . Paroxysmal atrial fibrillation (HCC) 11/16/2013  . Ischemic cardiomyopathy 11/16/2013  . Headache 11/16/2013  . Chronic anticoagulation 11/16/2013  . Neck pain on left side 11/16/2013  . Chest pain at rest 11/16/2013  . Coronary artery disease 10/14/2013  . Chronic systolic heart failure (HCC) 10/14/2013  . AAA (abdominal aortic aneurysm) without rupture (HCC) 09/08/2013  . Hyperlipidemia 06/08/2013  . History of CVA (cerebrovascular accident) 05/02/2013  . PVD (peripheral vascular disease) 3.5cm AAA Aug 2014 04/29/2013  . NSTEMI - ? type 2 - Troponin 0.63 04/28/2013  . Chronic bilateral lower abdominal pain 02/17/2013  . Hypertension     Past Surgical History:  Procedure Laterality Date  . CAROTID STENT INSERTION  2015  . CAROTID STENT INSERTION N/A 05/09/2013   Procedure: CAROTID STENT INSERTION;  Surgeon: Runell Gess, MD;  Location: [facility] CATH LAB;  Service: Cardiovascular;  Laterality: N/A;  . ESOPHAGOGASTRODUODENOSCOPY  06/13/2008,12/31/12  . LEFT HEART CATHETERIZATION WITH CORONARY ANGIOGRAM N/A 05/04/2013   Procedure: LEFT HEART CATHETERIZATION WITH CORONARY ANGIOGRAM;  Surgeon: Peter M Swaziland, MD;  Location: [facility] CATH LAB;  Service: Cardiovascular;  Laterality: N/A;       Home Medications    Prior to Admission medications   Medication Sig Start Date End Date Taking? Authorizing Provider  allopurinol (ZYLOPRIM) 300 MG tablet Take 300 mg by mouth daily. To prevent gout  Yes [provider]  arformoterol (BROVANA) 15 MCG/2ML NEBU Take 2 mLs (15 mcg total) by nebulization 2 (two) times daily. 10/10/15  Yes Rhetta Mura, MD  arformoterol (BROVANA) 15 MCG/2ML NEBU Inhale 2 mLs into the lungs 2 (two) times daily. 11/11/16  Yes [provider]  aspirin EC 81 MG tablet Take 1 tablet (81 mg total) by mouth daily. 09/08/13  Yes Marvel Plan, MD  atorvastatin (LIPITOR) 40  MG tablet Take 1 tablet (40 mg total) by mouth daily at 6 PM. 05/11/13  Yes Sharol Harness, Brittainy M, PA-C  budesonide (PULMICORT) 0.5 MG/2ML nebulizer solution Take 2 mLs (0.5 mg total) by nebulization 2 (two) times daily. 10/10/15  Yes Rhetta Mura, MD  carvedilol (COREG) 6.25 MG tablet Take 1 tablet (6.25 mg total) by mouth 2 (two) times daily with a meal. 07/23/16  Yes Runell Gess, MD  esomeprazole (NEXIUM) 40 MG capsule Take 1 capsule (40 mg total) by mouth 2 (two) times daily before a meal. 07/21/16  Yes Johnson, Clanford L, MD  HYDROcodone-homatropine (HYCODAN) 5-1.5 MG/5ML syrup Take 5 mLs by mouth every 6 (six) hours as needed for cough.   Yes [provider]  ipratropium-albuterol (DUONEB) 0.5-2.5 (3) MG/3ML SOLN Take 3 mLs by nebulization 2 (two) times daily as needed (for shortness of breath or wheezing).   Yes [provider]  isosorbide mononitrate (IMDUR) 30 MG 24 hr tablet Take 30 mg by mouth once daily Patient taking differently: Take 30 mg by mouth daily. For control of heart pain 07/31/15  Yes Margarita Grizzle, MD  meclizine (ANTIVERT) 25 MG tablet Take 25 mg by mouth 3 (three) times daily as needed for dizziness.  09/05/16  Yes [provider]  NITROSTAT 0.4 MG SL tablet PLACE 1 TABLET UNDER THE TONGUE EVERY 5 MINUTES FOR 3 DOSES AS NEEDED FOR CHEST PAIN Patient taking differently: PLACE 0.4 MG UNDER THE TONGUE EVERY 5 MINUTES FOR 3 DOSES AS NEEDED FOR CHEST PAIN 10/29/15  Yes Runell Gess, MD  nystatin (MYCOSTATIN) 100000 UNIT/ML suspension Take 3 mLs by mouth 4 (four) times daily. 11/10/16  Yes [provider]  tamsulosin (FLOMAX) 0.4 MG CAPS capsule Take 0.4 mg by mouth daily. To improve bladder function   Yes [provider]  XARELTO 15 MG TABS tablet Take 15 mg by mouth daily.  10/24/16  Yes [provider]  dabigatran (PRADAXA) 75 MG CAPS capsule Take 1 capsule (75 mg total) by mouth every 12 (twelve) hours. Patient not  taking: Reported on 11/26/2016 10/21/16 11/20/16  Edsel Petrin, DO    Family History Family History  Problem Relation Age of Onset  . Colon cancer Neg Hx     Social History Social History  Substance Use Topics  . Smoking status: Former Smoker    Quit date: 02/17/2002  . Smokeless tobacco: Never Used     Comment: Quit seven years ago   . Alcohol use No     Allergies   Levofloxacin; Eliquis [apixaban]; Phenazopyridine hcl; Septra [sulfamethoxazole-trimethoprim]; Latex; and Tape   Review of Systems Review of Systems  Unable to perform ROS: Acuity of condition     Physical Exam Updated Vital Signs BP (!) 151/89   Pulse 89   Temp 97.8 F (36.6 C) (Oral)   Resp (!) 26   Ht 5' (1.524 m)   Wt 68 kg (150 lb)   SpO2 100%   BMI 29.29 kg/m   Physical Exam  Constitutional: He appears well-developed and well-nourished. No  distress.  Appears fatigued, but nontoxic  HENT:  Head: Normocephalic and atraumatic.  Mouth/Throat: Oropharynx is clear and moist.  Tongue midline  Eyes: Pupils are equal, round, and reactive to light. Conjunctivae and EOM are normal. No scleral icterus.  Neck: Normal range of motion.  No meningismus  Cardiovascular: Normal rate, regular rhythm and intact distal pulses.   Pulmonary/Chest: Effort normal. No respiratory distress. He has no wheezes.  Respirations even and unlabored.  Musculoskeletal: Normal range of motion.  Neurological: He is alert.  Very difficult exam 2/2 patient effort and language barrier. No facial asymmetry noted. No flattening to nasolabial folds. Patient moving all extremities independently. Unable to thoroughly assess strength as patient will not follow these commands. No expressive speech; concern for aphasia.  Skin: Skin is warm and dry. No rash noted. He is not diaphoretic. No erythema. No pallor.  Psychiatric: He has a normal mood and affect. His behavior is normal.  Nursing note and vitals reviewed.    ED Treatments  / Results  Labs (all labs ordered are listed, but only abnormal results are displayed) Labs Reviewed  PROTIME-INR - Abnormal; Notable for the following:       Result Value   Prothrombin Time 28.9 (*)    All other components within normal limits  APTT - Abnormal; Notable for the following:    aPTT 70 (*)    All other components within normal limits  CBC - Abnormal; Notable for the following:    RBC 3.11 (*)    Hemoglobin 9.5 (*)    HCT 28.7 (*)    All other components within normal limits  COMPREHENSIVE METABOLIC PANEL - Abnormal; Notable for the following:    Sodium 131 (*)    Chloride 99 (*)    Glucose, Bld 130 (*)    Creatinine, Ser 1.37 (*)    Albumin 3.3 (*)    ALT 16 (*)    GFR calc non Af Amer 44 (*)    GFR calc Af Amer 51 (*)    All other components within normal limits  I-STAT CHEM 8, ED - Abnormal; Notable for the following:    Sodium 134 (*)    Chloride 96 (*)    Creatinine, Ser 1.40 (*)    Glucose, Bld 125 (*)    Hemoglobin 9.9 (*)    HCT 29.0 (*)    All other components within normal limits  DIFFERENTIAL  I-STAT TROPONIN, ED  CBG MONITORING, ED    EKG  EKG Interpretation  Date/Time:  Wednesday November 26 2016 21:41:41 EDT Ventricular Rate:  109 PR Interval:    QRS Duration: 120 QT Interval:  359 QTC Calculation: 445 R Axis:   11 Text Interpretation:  Sinus tachycardia Supraventricular bigeminy Incomplete left bundle branch block Minimal ST elevation, inferior leads Confirmed by Rolland Porter ([id number]) on 11/26/2016 10:00:18 PM       Radiology Ct Head Wo Contrast  Result Date: 11/26/2016 CLINICAL DATA:  83 y/o M; inability to talk. Week with right-sided numbness. EXAM: CT HEAD WITHOUT CONTRAST TECHNIQUE: Contiguous axial images were obtained from the base of the skull through the vertex without intravenous contrast. COMPARISON:  10/21/2016 CT head and 10/18/2016 MRI of the head. FINDINGS: Brain: Hemorrhagic conversion of left parietooccipital  infarction without significant mass effect or extra-axial collection. Stable small areas of now chronic infarction in right temporal periventricular white matter, left thalamus, and scattered through left frontal and parietal cortices. Stable bilateral frontal and left parietal chronic infarctions from prior  study. Stable chronic microvascular ischemic changes and parenchymal volume loss of the brain. Subcentimeter focus of increased density within the left caudate head with associated hypoattenuation new from prior study compatible with a small hemorrhagic infarction (series 7, image 17). New small focus of hypoattenuation involving left lateral parietal cortex compatible with interval acute/ early subacute infarction (series 7, image 23). Vascular: Calcific atherosclerosis of carotid siphons. Left MCA hyperdensity in left lateral MCA cistern (series 7, image 11). Skull: Normal. Negative for fracture or focal lesion. Sinuses/Orbits: No acute finding. Other: None. IMPRESSION: 1. New foci of hypoattenuation within the left parietal cortex and left caudate head compatible with interval late acute/early subacute infarction from 10/21/2016. Subcentimeter focus of hemorrhage within the new left caudate head infarction. No significant mass effect. 2. Hemorrhagic conversion of left parietooccipital infarction. No significant mass effect or extra-axial collection. 3. Multiple chronic infarcts as described are stable. 4. Hyperdense distal left MCA in the MCA cistern may represent thrombus. These results were called by telephone at the time of interpretation on 11/26/2016 at 11:50 pm to Dr. Rolland Porter , who verbally acknowledged these results. Electronically Signed   By: Mitzi Hansen M.D.   On: 11/26/2016 23:54   Ct Angio Head W Or Wo Contrast  Result Date: 11/27/2016 CLINICAL DATA:  83 y/o  M; code stroke. EXAM: CT ANGIOGRAPHY HEAD AND NECK CT PERFUSION BRAIN TECHNIQUE: Multidetector CT imaging of the head  and neck was performed using the standard protocol during bolus administration of intravenous contrast. Multiplanar CT image reconstructions and MIPs were obtained to evaluate the vascular anatomy. Carotid stenosis measurements (when applicable) are obtained utilizing NASCET criteria, using the distal internal carotid diameter as the denominator. Multiphase CT imaging of the brain was performed following IV bolus contrast injection. Subsequent parametric perfusion maps were calculated using RAPID software. CONTRAST:  90 cc Isovue 370 COMPARISON:  None. FINDINGS: CTA NECK FINDINGS Aortic arch: Ascending aortic aneurysm measuring up to 4.2 cm. Moderate to severe mixed plaque of the aortic arch with multiple plaque ulcerations. Great vessel origins are patent without high-grade stenosis. Three-vessel arch. Right carotid system: No evidence of dissection, stenosis (50% or greater) or occlusion. Moderate mixed plaque of the carotid bifurcation without significant stenosis. Left carotid system: No evidence of dissection, stenosis (50% or greater) or occlusion. Stent extending from the distal left common carotid artery to the proximal left internal carotid artery. The stent is widely patent. Severe stenosis of left external artery origin. Vertebral arteries: Left dominant. No evidence of dissection, stenosis (50% or greater) or occlusion. Hairpin loops of V1 segments bilaterally. Skeleton: Moderate cervical spondylosis with multilevel disc and facet degenerative changes. No high-grade bony canal stenosis. Other neck: Negative. Upper chest: Peripheral reticulation of the lung parenchyma compatible fibrosis. Enlarged mediastinal lymph nodes including a left lower paratracheal lymph node measuring 12 mm short axis, probably reactive. Review of the MIP images confirms the above findings CTA HEAD FINDINGS Anterior circulation: Left M2 inferior division proximal occlusion with poor collateral circulation in the left posterior  MCA distribution. Left posterior communicating artery ICA origin is rounded and measures 4 mm in diameter compatible with aneurysmal dilatation (series 10, image 106). Otherwise no significant stenosis, occlusion, or aneurysm of the anterior circulation. Posterior circulation: No significant stenosis, occlusion, or vascular malformation. Left V4 segment focal ectasia measuring 6 mm with ulcerated mural plaque (series 8, image 156). Venous sinuses: As permitted by contrast timing, patent. Anatomic variants: Left fetal PCA. Patent small right posterior communicating artery and anterior  communicating artery. Delayed phase: Right mastoid opacification and sclerosis compatible sequelae of chronic otomastoiditis. Review of the MIP images confirms the above findings CT Brain Perfusion Findings: CBF (<30%) Volume: 48mL Perfusion (Tmax>6.0s) volume: Mismatch Volume: 81mL Infarction Location:Left posterior MCA IMPRESSION: 1. Left middle cerebral artery proximal M2 inferior division occlusion. Poor collateral circulation in the left posterior MCA distribution. 2. CT perfusion demonstrating left posterior MCA distribution core infarct with volume of 48 cc and ischemic penumbra of 81 cc by automated RAPID post processing. 3. Patent carotid and vertebral arteries of the neck. No high-grade stenosis by NASCET criteria, aneurysm, or dissection. 4. Stented left distal common carotid artery to proximal left internal carotid artery. The stent appears widely patent. 5. 4 mm rounded dilation of left posterior communicating artery ICA origin compatible with aneurysm. 6. Left V4 segment focal ectasia measuring 6 mm with ulcerated mural plaque. 7. 4.2 cm ascending aortic aneurysm. Recommend annual imaging followup by CTA or MRA. This recommendation follows 2010 ACCF/AHA/AATS/ACR/ASA/SCA/SCAI/SIR/STS/SVM Guidelines for the Diagnosis and Management of Patients with Thoracic Aortic Disease. Circulation. 2010; 121: Z610-R604. 8. Moderate  to severe mixed plaque of the aorta with multiple plaque ulcerations. These results were called by telephone at the time of interpretation on 11/27/2016 at 1:29am to PA [facility], who verbally acknowledged these results. Electronically Signed   By: Mitzi Hansen M.D.   On: 11/27/2016 01:34   Ct Angio Neck W Or Wo Contrast  Result Date: 11/27/2016 CLINICAL DATA:  83 y/o  M; code stroke. EXAM: CT ANGIOGRAPHY HEAD AND NECK CT PERFUSION BRAIN TECHNIQUE: Multidetector CT imaging of the head and neck was performed using the standard protocol during bolus administration of intravenous contrast. Multiplanar CT image reconstructions and MIPs were obtained to evaluate the vascular anatomy. Carotid stenosis measurements (when applicable) are obtained utilizing NASCET criteria, using the distal internal carotid diameter as the denominator. Multiphase CT imaging of the brain was performed following IV bolus contrast injection. Subsequent parametric perfusion maps were calculated using RAPID software. CONTRAST:  90 cc Isovue 370 COMPARISON:  None. FINDINGS: CTA NECK FINDINGS Aortic arch: Ascending aortic aneurysm measuring up to 4.2 cm. Moderate to severe mixed plaque of the aortic arch with multiple plaque ulcerations. Great vessel origins are patent without high-grade stenosis. Three-vessel arch. Right carotid system: No evidence of dissection, stenosis (50% or greater) or occlusion. Moderate mixed plaque of the carotid bifurcation without significant stenosis. Left carotid system: No evidence of dissection, stenosis (50% or greater) or occlusion. Stent extending from the distal left common carotid artery to the proximal left internal carotid artery. The stent is widely patent. Severe stenosis of left external artery origin. Vertebral arteries: Left dominant. No evidence of dissection, stenosis (50% or greater) or occlusion. Hairpin loops of V1 segments bilaterally. Skeleton: Moderate cervical spondylosis with  multilevel disc and facet degenerative changes. No high-grade bony canal stenosis. Other neck: Negative. Upper chest: Peripheral reticulation of the lung parenchyma compatible fibrosis. Enlarged mediastinal lymph nodes including a left lower paratracheal lymph node measuring 12 mm short axis, probably reactive. Review of the MIP images confirms the above findings CTA HEAD FINDINGS Anterior circulation: Left M2 inferior division proximal occlusion with poor collateral circulation in the left posterior MCA distribution. Left posterior communicating artery ICA origin is rounded and measures 4 mm in diameter compatible with aneurysmal dilatation (series 10, image 106). Otherwise no significant stenosis, occlusion, or aneurysm of the anterior circulation. Posterior circulation: No significant stenosis, occlusion, or vascular malformation. Left V4 segment focal ectasia measuring  6 mm with ulcerated mural plaque (series 8, image 156). Venous sinuses: As permitted by contrast timing, patent. Anatomic variants: Left fetal PCA. Patent small right posterior communicating artery and anterior communicating artery. Delayed phase: Right mastoid opacification and sclerosis compatible sequelae of chronic otomastoiditis. Review of the MIP images confirms the above findings CT Brain Perfusion Findings: CBF (<30%) Volume: 48mL Perfusion (Tmax>6.0s) volume: 129mL Mismatch Volume: 81mL Infarction Location:Left posterior MCA IMPRESSION: 1. Left middle cerebral artery proximal M2 inferior division occlusion. Poor collateral circulation in the left posterior MCA distribution. 2. CT perfusion demonstrating left posterior MCA distribution core infarct with volume of 48 cc and ischemic penumbra of 81 cc by automated RAPID post processing. 3. Patent carotid and vertebral arteries of the neck. No high-grade stenosis by NASCET criteria, aneurysm, or dissection. 4. Stented left distal common carotid artery to proximal left internal carotid artery.  The stent appears widely patent. 5. 4 mm rounded dilation of left posterior communicating artery ICA origin compatible with aneurysm. 6. Left V4 segment focal ectasia measuring 6 mm with ulcerated mural plaque. 7. 4.2 cm ascending aortic aneurysm. Recommend annual imaging followup by CTA or MRA. This recommendation follows 2010 ACCF/AHA/AATS/ACR/ASA/SCA/SCAI/SIR/STS/SVM Guidelines for the Diagnosis and Management of Patients with Thoracic Aortic Disease. Circulation. 2010; 121: [id number]. 8. Moderate to severe mixed plaque of the aorta with multiple plaque ulcerations. These results were called by telephone at the time of interpretation on 11/27/2016 at 1:29am to [facility], who verbally acknowledged these results. Electronically Signed   By: Mitzi HansenLance  Furusawa-Stratton M.D.   On: 11/27/2016 01:34   Ct Cerebral Perfusion W Contrast  Result Date: 11/27/2016 CLINICAL DATA:  83 y/o  M; code stroke. EXAM: CT ANGIOGRAPHY HEAD AND NECK CT PERFUSION BRAIN TECHNIQUE: Multidetector CT imaging of the head and neck was performed using the standard protocol during bolus administration of intravenous contrast. Multiplanar CT image reconstructions and MIPs were obtained to evaluate the vascular anatomy. Carotid stenosis measurements (when applicable) are obtained utilizing NASCET criteria, using the distal internal carotid diameter as the denominator. Multiphase CT imaging of the brain was performed following IV bolus contrast injection. Subsequent parametric perfusion maps were calculated using RAPID software. CONTRAST:  90 cc Isovue 370 COMPARISON:  None. FINDINGS: CTA NECK FINDINGS Aortic arch: Ascending aortic aneurysm measuring up to 4.2 cm. Moderate to severe mixed plaque of the aortic arch with multiple plaque ulcerations. Great vessel origins are patent without high-grade stenosis. Three-vessel arch. Right carotid system: No evidence of dissection, stenosis (50% or greater) or occlusion. Moderate mixed plaque of the  carotid bifurcation without significant stenosis. Left carotid system: No evidence of dissection, stenosis (50% or greater) or occlusion. Stent extending from the distal left common carotid artery to the proximal left internal carotid artery. The stent is widely patent. Severe stenosis of left external artery origin. Vertebral arteries: Left dominant. No evidence of dissection, stenosis (50% or greater) or occlusion. Hairpin loops of V1 segments bilaterally. Skeleton: Moderate cervical spondylosis with multilevel disc and facet degenerative changes. No high-grade bony canal stenosis. Other neck: Negative. Upper chest: Peripheral reticulation of the lung parenchyma compatible fibrosis. Enlarged mediastinal lymph nodes including a left lower paratracheal lymph node measuring 12 mm short axis, probably reactive. Review of the MIP images confirms the above findings CTA HEAD FINDINGS Anterior circulation: Left M2 inferior division proximal occlusion with poor collateral circulation in the left posterior MCA distribution. Left posterior communicating artery ICA origin is rounded and measures 4 mm in diameter compatible with aneurysmal dilatation (series  10, image 106). Otherwise no significant stenosis, occlusion, or aneurysm of the anterior circulation. Posterior circulation: No significant stenosis, occlusion, or vascular malformation. Left V4 segment focal ectasia measuring 6 mm with ulcerated mural plaque (series 8, image 156). Venous sinuses: As permitted by contrast timing, patent. Anatomic variants: Left fetal PCA. Patent small right posterior communicating artery and anterior communicating artery. Delayed phase: Right mastoid opacification and sclerosis compatible sequelae of chronic otomastoiditis. Review of the MIP images confirms the above findings CT Brain Perfusion Findings: CBF (<30%) Volume: 48mL Perfusion (Tmax>6.0s) volume: Mismatch Volume: 81mL Infarction Location:Left posterior MCA IMPRESSION: 1.  Left middle cerebral artery proximal M2 inferior division occlusion. Poor collateral circulation in the left posterior MCA distribution. 2. CT perfusion demonstrating left posterior MCA distribution core infarct with volume of 48 cc and ischemic penumbra of 81 cc by automated RAPID post processing. 3. Patent carotid and vertebral arteries of the neck. No high-grade stenosis by NASCET criteria, aneurysm, or dissection. 4. Stented left distal common carotid artery to proximal left internal carotid artery. The stent appears widely patent. 5. 4 mm rounded dilation of left posterior communicating artery ICA origin compatible with aneurysm. 6. Left V4 segment focal ectasia measuring 6 mm with ulcerated mural plaque. 7. 4.2 cm ascending aortic aneurysm. Recommend annual imaging followup by CTA or MRA. This recommendation follows 2010 ACCF/AHA/AATS/ACR/ASA/SCA/SCAI/SIR/STS/SVM Guidelines for the Diagnosis and Management of Patients with Thoracic Aortic Disease. Circulation. 2010; 121: Z610-R604. 8. Moderate to severe mixed plaque of the aorta with multiple plaque ulcerations. These results were called by telephone at the time of interpretation on 11/27/2016 at 1:29am to PA [facility], who verbally acknowledged these results. Electronically Signed   By: Mitzi Hansen M.D.   On: 11/27/2016 01:34    Procedures Procedures (including critical care time)   11/19/16: TECHNIQUE:  16 channel EEG was performed based on standard 10-16 international system. CONCLUSION: This is a an abnormal EEG, there is electrodiagnostic evidence of mild to moderate generalized background slowing, consistent with Bi-hemisphere malfunction, common etiology are metabolic toxic.    Medications Ordered in ED Medications  sodium chloride 0.9 % bolus 1,000 mL (0 mLs Intravenous Stopped 11/27/16 0010)    CRITICAL CARE Performed by: Antony Madura   Total critical care time: 60 minutes  Critical care time was exclusive of  separately billable procedures and treating other patients.  Critical care was necessary to treat or prevent imminent or life-threatening deterioration.  Critical care was time spent personally by me on the following activities: development of treatment plan with patient and/or surrogate as well as nursing, discussions with consultants, evaluation of patient's response to treatment, examination of patient, obtaining history from patient or surrogate, ordering and performing treatments and interventions, ordering and review of laboratory studies, ordering and review of radiographic studies, pulse oximetry and re-evaluation of patient's condition.   Initial Impression / Assessment and Plan / ED Course  I have reviewed the triage vital signs and the nursing notes.  Pertinent labs & imaging results that were available during my care of the patient were reviewed by me and considered in my medical decision making (see chart for details).     10:10 PM Patient presents from home with family at bedside. Daughter expresses concern for new aphasia which began at 1600. Patient very difficult to assess on neurologic exam. No obvious focal deficits, though expressive aphasia is likely. Will obtain labs and head CT for evaluation.  12:00 AM Case discussed with Dr. Otelia Limes including hx of CVA x  3 since June with CT findings c/w hemorrhagic conversion of prior infarcts as well as new foci of hypoattenuation within the left parietal cortex and left caudate head compatible with interval late acute/early subacute Infarction. There is also a hyperdense distal left MCA in the MCA cistern concerning for thrombus. Dr. Otelia Limes made aware of anticoagulation on Xarelto with INR of 2.75 today. Questioned regarding possible need for Kcentra. Dr. Otelia Limes to discuss case with Dr. Corliss Skains and call back.  12:28 AM Code stroke activated by Dr. Otelia Limes. Patient to go for CTA and perfusion studies. Plan discussed between Dr.  Otelia Limes and family.   1:32 AM Call received from radiology MD, Dr. Harrie Jeans, regarding the patient's CTA and perfusion study. Patient found to have a large vessel occlusion of the left MCA M2 inferior division as well as a perfusion mismatch indicating a large ischemic penumbra. Text page sent to Dr. Georg Ruddle of neurology regarding these results. Will formally page to discuss plan of care and additional management.  1:49 AM Per Dr. Otelia Limes, plan is to proceed with IR intervention. Patient to be admitted to neuro ICU post procedure.   Final Clinical Impressions(s) / ED Diagnoses   Final diagnoses:  Aphasia  Cerebrovascular accident (CVA), unspecified mechanism [facility])    New Prescriptions New Prescriptions   No medications on file     Antony Madura, Cordelia Poche 11/27/16 0151    Rolland Porter, MD 12/01/16 2239

## 2016-11-26 NOTE — Procedures (Signed)
   HISTORY: 83 year old male, with history of stroke, atrial fibrillation, embolic right MCA stroke, rapid significant cognitive decline. TECHNIQUE:  16 channel EEG was performed based on standard 10-16 international system. One channel was dedicated to EKG, which has demonstrates normal sinus rhythm of 84 beats per minutes.  Upon awakening, the posterior background activity was dysarrhythmic, in the theta range, reactive to eye opening and closure.  There was no evidence of epileptiform discharge.  Photic stimulation was performed Hyperventilation was performed.  No sleep was achieved.  CONCLUSION: This is a an abnormal EEG, there is electrodiagnostic evidence of mild to moderate generalized background slowing, consistent with  Bi-hemisphere malfunction, common etiology are metabolic toxic.    [name], M.D. Ph.D.  [facility] [address] [place], [place] [address] Phone: [phone] Fax:      [phone]

## 2016-11-26 NOTE — Telephone Encounter (Signed)
Rn call [facility] with [facility]. Rn stated no medications were order or change at last visit. Rosanne Ashing stated in the last month, pts cognitive decline has decrease in communicating with his daughter. Rn stated lab work was done for [employer]. Rn also stated the sodium was low,and his is mildly anemia. DR. Pearlean Brownie wanted the PCP to evaluate the patients low sodium,and anemic issues. Rn stated all labs were fax. Rosanne Ashing verbalized understanding.

## 2016-11-27 ENCOUNTER — Inpatient Hospital Stay (HOSPITAL_COMMUNITY): Payer: Medicare Other

## 2016-11-27 ENCOUNTER — Encounter (HOSPITAL_COMMUNITY)
Admission: EM | Disposition: A | Discharge status: Rehabilitation Facility | Payer: Self-pay | Source: Home / Self Care | Attending: Neurology

## 2016-11-27 ENCOUNTER — Emergency Department (HOSPITAL_COMMUNITY): Payer: Medicare Other

## 2016-11-27 ENCOUNTER — Emergency Department (HOSPITAL_COMMUNITY): Payer: Medicare Other | Admitting: Anesthesiology

## 2016-11-27 ENCOUNTER — Encounter (HOSPITAL_COMMUNITY): Payer: Self-pay | Admitting: Radiology

## 2016-11-27 DIAGNOSIS — M109 Gout, unspecified: Secondary | ICD-10-CM | POA: Diagnosis present

## 2016-11-27 DIAGNOSIS — E8770 Fluid overload, unspecified: Secondary | ICD-10-CM | POA: Diagnosis not present

## 2016-11-27 DIAGNOSIS — I6330 Cerebral infarction due to thrombosis of unspecified cerebral artery: Secondary | ICD-10-CM

## 2016-11-27 DIAGNOSIS — N183 Chronic kidney disease, stage 3 (moderate): Secondary | ICD-10-CM | POA: Diagnosis not present

## 2016-11-27 DIAGNOSIS — J690 Pneumonitis due to inhalation of food and vomit: Secondary | ICD-10-CM | POA: Diagnosis not present

## 2016-11-27 DIAGNOSIS — N179 Acute kidney failure, unspecified: Secondary | ICD-10-CM | POA: Diagnosis not present

## 2016-11-27 DIAGNOSIS — I509 Heart failure, unspecified: Secondary | ICD-10-CM | POA: Diagnosis not present

## 2016-11-27 DIAGNOSIS — R0602 Shortness of breath: Secondary | ICD-10-CM | POA: Diagnosis not present

## 2016-11-27 DIAGNOSIS — E119 Type 2 diabetes mellitus without complications: Secondary | ICD-10-CM | POA: Diagnosis not present

## 2016-11-27 DIAGNOSIS — I480 Paroxysmal atrial fibrillation: Secondary | ICD-10-CM | POA: Diagnosis not present

## 2016-11-27 DIAGNOSIS — J9601 Acute respiratory failure with hypoxia: Secondary | ICD-10-CM

## 2016-11-27 DIAGNOSIS — A419 Sepsis, unspecified organism: Secondary | ICD-10-CM | POA: Diagnosis not present

## 2016-11-27 DIAGNOSIS — J441 Chronic obstructive pulmonary disease with (acute) exacerbation: Secondary | ICD-10-CM | POA: Diagnosis not present

## 2016-11-27 DIAGNOSIS — I5032 Chronic diastolic (congestive) heart failure: Secondary | ICD-10-CM | POA: Diagnosis present

## 2016-11-27 DIAGNOSIS — I482 Chronic atrial fibrillation: Secondary | ICD-10-CM | POA: Diagnosis present

## 2016-11-27 DIAGNOSIS — I130 Hypertensive heart and chronic kidney disease with heart failure and stage 1 through stage 4 chronic kidney disease, or unspecified chronic kidney disease: Secondary | ICD-10-CM | POA: Diagnosis present

## 2016-11-27 DIAGNOSIS — I63512 Cerebral infarction due to unspecified occlusion or stenosis of left middle cerebral artery: Secondary | ICD-10-CM | POA: Diagnosis not present

## 2016-11-27 DIAGNOSIS — I619 Nontraumatic intracerebral hemorrhage, unspecified: Secondary | ICD-10-CM | POA: Diagnosis present

## 2016-11-27 DIAGNOSIS — R531 Weakness: Secondary | ICD-10-CM | POA: Diagnosis not present

## 2016-11-27 DIAGNOSIS — I69351 Hemiplegia and hemiparesis following cerebral infarction affecting right dominant side: Secondary | ICD-10-CM | POA: Diagnosis not present

## 2016-11-27 DIAGNOSIS — I639 Cerebral infarction, unspecified: Secondary | ICD-10-CM | POA: Diagnosis present

## 2016-11-27 DIAGNOSIS — J189 Pneumonia, unspecified organism: Secondary | ICD-10-CM | POA: Diagnosis not present

## 2016-11-27 DIAGNOSIS — I6359 Cerebral infarction due to unspecified occlusion or stenosis of other cerebral artery: Secondary | ICD-10-CM | POA: Diagnosis not present

## 2016-11-27 DIAGNOSIS — I63412 Cerebral infarction due to embolism of left middle cerebral artery: Secondary | ICD-10-CM | POA: Diagnosis present

## 2016-11-27 DIAGNOSIS — E1165 Type 2 diabetes mellitus with hyperglycemia: Secondary | ICD-10-CM | POA: Diagnosis not present

## 2016-11-27 DIAGNOSIS — R1030 Lower abdominal pain, unspecified: Secondary | ICD-10-CM | POA: Diagnosis not present

## 2016-11-27 DIAGNOSIS — I69391 Dysphagia following cerebral infarction: Secondary | ICD-10-CM | POA: Diagnosis not present

## 2016-11-27 DIAGNOSIS — J45909 Unspecified asthma, uncomplicated: Secondary | ICD-10-CM | POA: Diagnosis not present

## 2016-11-27 DIAGNOSIS — Z8673 Personal history of transient ischemic attack (TIA), and cerebral infarction without residual deficits: Secondary | ICD-10-CM | POA: Diagnosis not present

## 2016-11-27 DIAGNOSIS — I712 Thoracic aortic aneurysm, without rupture: Secondary | ICD-10-CM | POA: Diagnosis present

## 2016-11-27 DIAGNOSIS — R29717 NIHSS score 17: Secondary | ICD-10-CM | POA: Diagnosis present

## 2016-11-27 DIAGNOSIS — E874 Mixed disorder of acid-base balance: Secondary | ICD-10-CM | POA: Diagnosis present

## 2016-11-27 DIAGNOSIS — I481 Persistent atrial fibrillation: Secondary | ICD-10-CM | POA: Diagnosis not present

## 2016-11-27 DIAGNOSIS — I447 Left bundle-branch block, unspecified: Secondary | ICD-10-CM | POA: Diagnosis present

## 2016-11-27 DIAGNOSIS — Z8739 Personal history of other diseases of the musculoskeletal system and connective tissue: Secondary | ICD-10-CM | POA: Diagnosis not present

## 2016-11-27 DIAGNOSIS — I10 Essential (primary) hypertension: Secondary | ICD-10-CM | POA: Diagnosis not present

## 2016-11-27 DIAGNOSIS — D62 Acute posthemorrhagic anemia: Secondary | ICD-10-CM | POA: Diagnosis not present

## 2016-11-27 DIAGNOSIS — R0682 Tachypnea, not elsewhere classified: Secondary | ICD-10-CM | POA: Diagnosis not present

## 2016-11-27 DIAGNOSIS — E871 Hypo-osmolality and hyponatremia: Secondary | ICD-10-CM | POA: Diagnosis present

## 2016-11-27 DIAGNOSIS — R4701 Aphasia: Secondary | ICD-10-CM

## 2016-11-27 DIAGNOSIS — J9600 Acute respiratory failure, unspecified whether with hypoxia or hypercapnia: Secondary | ICD-10-CM

## 2016-11-27 DIAGNOSIS — H9190 Unspecified hearing loss, unspecified ear: Secondary | ICD-10-CM | POA: Diagnosis present

## 2016-11-27 DIAGNOSIS — I2510 Atherosclerotic heart disease of native coronary artery without angina pectoris: Secondary | ICD-10-CM | POA: Diagnosis present

## 2016-11-27 DIAGNOSIS — N400 Enlarged prostate without lower urinary tract symptoms: Secondary | ICD-10-CM | POA: Diagnosis present

## 2016-11-27 DIAGNOSIS — E785 Hyperlipidemia, unspecified: Secondary | ICD-10-CM | POA: Diagnosis present

## 2016-11-27 DIAGNOSIS — I6340 Cerebral infarction due to embolism of unspecified cerebral artery: Secondary | ICD-10-CM | POA: Diagnosis not present

## 2016-11-27 DIAGNOSIS — E876 Hypokalemia: Secondary | ICD-10-CM | POA: Diagnosis not present

## 2016-11-27 DIAGNOSIS — G9340 Encephalopathy, unspecified: Secondary | ICD-10-CM | POA: Diagnosis not present

## 2016-11-27 DIAGNOSIS — Z7901 Long term (current) use of anticoagulants: Secondary | ICD-10-CM | POA: Diagnosis not present

## 2016-11-27 HISTORY — PX: IR PERCUTANEOUS ART THROMBECTOMY/INFUSION INTRACRANIAL INC DIAG ANGIO: IMG6087

## 2016-11-27 HISTORY — PX: RADIOLOGY WITH ANESTHESIA: SHX6223

## 2016-11-27 LAB — MRSA PCR SCREENING: MRSA BY PCR: POSITIVE — AB

## 2016-11-27 LAB — POCT I-STAT 3, ART BLOOD GAS (G3+)
Acid-base deficit: 5 mmol/L — ABNORMAL HIGH (ref 0.0–2.0)
Bicarbonate: 22.1 mmol/L (ref 20.0–28.0)
O2 Saturation: 100 %
PH ART: 7.269 — AB (ref 7.350–7.450)
TCO2: 24 mmol/L (ref 22–32)
pCO2 arterial: 48.2 mmHg — ABNORMAL HIGH (ref 32.0–48.0)
pO2, Arterial: 432 mmHg — ABNORMAL HIGH (ref 83.0–108.0)

## 2016-11-27 LAB — PROTIME-INR
INR: 1.67
PROTHROMBIN TIME: 19.6 s — AB (ref 11.4–15.2)

## 2016-11-27 LAB — TRIGLYCERIDES: Triglycerides: 74 mg/dL (ref <150)

## 2016-11-27 SURGERY — IR WITH ANESTHESIA
Anesthesia: General

## 2016-11-27 MED ORDER — PROPOFOL 500 MG/50ML IV EMUL
INTRAVENOUS | Status: DC | PRN
  Administered 2016-11-27: 25 ug/kg/min via INTRAVENOUS

## 2016-11-27 MED ORDER — FENTANYL CITRATE (PF) 100 MCG/2ML IJ SOLN
50.0000 ug | INTRAMUSCULAR | Status: DC | PRN
  Administered 2016-11-27: 50 ug via INTRAVENOUS
  Filled 2016-11-27: qty 2

## 2016-11-27 MED ORDER — SENNOSIDES-DOCUSATE SODIUM 8.6-50 MG PO TABS: 1.0000 | ORAL_TABLET | Freq: Every evening | ORAL | Status: DC | PRN

## 2016-11-27 MED ORDER — DILTIAZEM HCL 100 MG IV SOLR
5.0000 mg/h | INTRAVENOUS | Status: DC
  Filled 2016-11-27: qty 100

## 2016-11-27 MED ORDER — SODIUM CHLORIDE 0.9 % IV SOLN
INTRAVENOUS | Status: DC | PRN
  Administered 2016-11-27: 03:00:00 via INTRAVENOUS

## 2016-11-27 MED ORDER — IOPAMIDOL (ISOVUE-300) INJECTION 61%
INTRAVENOUS | Status: AC
  Filled 2016-11-27: qty 150

## 2016-11-27 MED ORDER — SODIUM CHLORIDE 0.9 % IV BOLUS (SEPSIS)
500.0000 mL | Freq: Once | INTRAVENOUS | Status: AC
  Administered 2016-11-27: 500 mL via INTRAVENOUS

## 2016-11-27 MED ORDER — SODIUM CHLORIDE 0.9 % IV SOLN
0.0000 ug/min | INTRAVENOUS | Status: DC
  Administered 2016-11-27: 40 ug/min via INTRAVENOUS
  Administered 2016-11-27: 20 ug/min via INTRAVENOUS
  Administered 2016-11-27: 60 ug/min via INTRAVENOUS
  Filled 2016-11-27 (×5): qty 1

## 2016-11-27 MED ORDER — IOPAMIDOL (ISOVUE-300) INJECTION 61%
50.0000 mL | Freq: Once | INTRAVENOUS | Status: AC | PRN
  Administered 2016-11-27: 50 mL via INTRA_ARTERIAL

## 2016-11-27 MED ORDER — DILTIAZEM HCL-DEXTROSE 100-5 MG/100ML-% IV SOLN (PREMIX)
5.0000 mg/h | INTRAVENOUS | Status: DC
  Administered 2016-11-27 – 2016-12-01 (×5): 5 mg/h via INTRAVENOUS
  Filled 2016-11-27 (×4): qty 100

## 2016-11-27 MED ORDER — IOPAMIDOL (ISOVUE-370) INJECTION 76%
INTRAVENOUS | Status: AC
  Administered 2016-11-27: 100 mL
  Filled 2016-11-27: qty 100

## 2016-11-27 MED ORDER — IPRATROPIUM-ALBUTEROL 0.5-2.5 (3) MG/3ML IN SOLN
3.0000 mL | Freq: Four times a day (QID) | RESPIRATORY_TRACT | Status: DC
  Administered 2016-11-27 – 2016-12-01 (×17): 3 mL via RESPIRATORY_TRACT
  Filled 2016-11-27 (×18): qty 3

## 2016-11-27 MED ORDER — CEFAZOLIN SODIUM-DEXTROSE 2-3 GM-%(50ML) IV SOLR
INTRAVENOUS | Status: DC | PRN
  Administered 2016-11-27: 2 g via INTRAVENOUS

## 2016-11-27 MED ORDER — ISOSORBIDE MONONITRATE ER 30 MG PO TB24: 30.0000 mg | ORAL_TABLET | Freq: Every day | ORAL | Status: DC

## 2016-11-27 MED ORDER — ARFORMOTEROL TARTRATE 15 MCG/2ML IN NEBU
15.0000 ug | INHALATION_SOLUTION | Freq: Two times a day (BID) | RESPIRATORY_TRACT | Status: DC
Start: 2016-11-27 — End: 2016-12-03
  Administered 2016-11-27 – 2016-12-03 (×12): 15 ug via RESPIRATORY_TRACT
  Filled 2016-11-27 (×13): qty 2

## 2016-11-27 MED ORDER — MUPIROCIN 2 % EX OINT
1.0000 "application " | TOPICAL_OINTMENT | Freq: Two times a day (BID) | CUTANEOUS | Status: AC
  Administered 2016-11-27 – 2016-12-01 (×10): 1 via NASAL
  Filled 2016-11-27 (×4): qty 22

## 2016-11-27 MED ORDER — ORAL CARE MOUTH RINSE
15.0000 mL | Freq: Four times a day (QID) | OROMUCOSAL | Status: DC
  Administered 2016-11-27 – 2016-12-03 (×26): 15 mL via OROMUCOSAL

## 2016-11-27 MED ORDER — CARVEDILOL 3.125 MG PO TABS: 6.2500 mg | ORAL_TABLET | Freq: Two times a day (BID) | ORAL | Status: DC

## 2016-11-27 MED ORDER — METOPROLOL TARTRATE 5 MG/5ML IV SOLN: 2.5000 mg | INTRAVENOUS | Status: DC | PRN

## 2016-11-27 MED ORDER — IOPAMIDOL (ISOVUE-300) INJECTION 61%
INTRAVENOUS | Status: AC
  Filled 2016-11-27: qty 300

## 2016-11-27 MED ORDER — IPRATROPIUM-ALBUTEROL 0.5-2.5 (3) MG/3ML IN SOLN: 3.0000 mL | Freq: Two times a day (BID) | RESPIRATORY_TRACT | Status: DC | PRN

## 2016-11-27 MED ORDER — NITROGLYCERIN 1 MG/10 ML FOR IR/CATH LAB
INTRA_ARTERIAL | Status: AC
  Filled 2016-11-27: qty 10

## 2016-11-27 MED ORDER — ACETAMINOPHEN 325 MG PO TABS
650.0000 mg | ORAL_TABLET | ORAL | Status: DC | PRN
  Administered 2016-12-03: 650 mg via ORAL
  Filled 2016-11-27: qty 2

## 2016-11-27 MED ORDER — SODIUM CHLORIDE 0.9 % IV SOLN
INTRAVENOUS | Status: DC
  Administered 2016-11-27 (×2): via INTRAVENOUS

## 2016-11-27 MED ORDER — EPTIFIBATIDE 20 MG/10ML IV SOLN
INTRAVENOUS | Status: AC
  Filled 2016-11-27: qty 10

## 2016-11-27 MED ORDER — FENTANYL CITRATE (PF) 100 MCG/2ML IJ SOLN: 50.0000 ug | INTRAMUSCULAR | Status: DC | PRN

## 2016-11-27 MED ORDER — PROPOFOL 10 MG/ML IV BOLUS
INTRAVENOUS | Status: DC | PRN
  Administered 2016-11-27: 110 mg via INTRAVENOUS

## 2016-11-27 MED ORDER — LIDOCAINE HCL (CARDIAC) 20 MG/ML IV SOLN
INTRAVENOUS | Status: DC | PRN
  Administered 2016-11-27: 100 mg via INTRATRACHEAL

## 2016-11-27 MED ORDER — ROCURONIUM BROMIDE 100 MG/10ML IV SOLN
INTRAVENOUS | Status: DC | PRN
  Administered 2016-11-27: 80 mg via INTRAVENOUS
  Administered 2016-11-27: 20 mg via INTRAVENOUS

## 2016-11-27 MED ORDER — CEFAZOLIN SODIUM-DEXTROSE 2-4 GM/100ML-% IV SOLN
INTRAVENOUS | Status: AC
  Filled 2016-11-27: qty 100

## 2016-11-27 MED ORDER — NICARDIPINE HCL IN NACL 20-0.86 MG/200ML-% IV SOLN
INTRAVENOUS | Status: AC
  Filled 2016-11-27: qty 200

## 2016-11-27 MED ORDER — CHLORHEXIDINE GLUCONATE 0.12% ORAL RINSE (MEDLINE KIT)
15.0000 mL | Freq: Two times a day (BID) | OROMUCOSAL | Status: DC
  Administered 2016-11-27 – 2016-12-03 (×13): 15 mL via OROMUCOSAL

## 2016-11-27 MED ORDER — NICARDIPINE HCL IN NACL 20-0.86 MG/200ML-% IV SOLN
0.0000 mg/h | INTRAVENOUS | Status: DC
  Administered 2016-11-27: 12.5 mg/h via INTRAVENOUS

## 2016-11-27 MED ORDER — PHENYLEPHRINE HCL 10 MG/ML IJ SOLN
INTRAVENOUS | Status: DC | PRN
  Administered 2016-11-27: 15 ug/min via INTRAVENOUS

## 2016-11-27 MED ORDER — PROPOFOL 1000 MG/100ML IV EMUL
0.0000 ug/kg/min | INTRAVENOUS | Status: DC
  Administered 2016-11-27: 50 ug/kg/min via INTRAVENOUS
  Administered 2016-11-27: 40 ug/kg/min via INTRAVENOUS
  Administered 2016-11-27: 35 ug/kg/min via INTRAVENOUS
  Administered 2016-11-28: 30 ug/kg/min via INTRAVENOUS
  Filled 2016-11-27 (×4): qty 100

## 2016-11-27 MED ORDER — ALLOPURINOL 300 MG PO TABS
300.0000 mg | ORAL_TABLET | Freq: Every day | ORAL | Status: DC
  Administered 2016-11-29 – 2016-12-03 (×5): 300 mg via ORAL
  Filled 2016-11-27 (×5): qty 1

## 2016-11-27 MED ORDER — ACETAMINOPHEN 650 MG RE SUPP
650.0000 mg | RECTAL | Status: DC | PRN
  Administered 2016-11-27 – 2016-11-28 (×2): 650 mg via RECTAL
  Filled 2016-11-27 (×2): qty 1

## 2016-11-27 MED ORDER — SODIUM CHLORIDE 0.9 % IV SOLN: INTRAVENOUS | Status: DC

## 2016-11-27 MED ORDER — CHLORHEXIDINE GLUCONATE CLOTH 2 % EX PADS
6.0000 | MEDICATED_PAD | Freq: Every day | CUTANEOUS | Status: DC
  Administered 2016-11-28 – 2016-12-02 (×3): 6 via TOPICAL

## 2016-11-27 MED ORDER — PANTOPRAZOLE SODIUM 40 MG IV SOLR
40.0000 mg | Freq: Every day | INTRAVENOUS | Status: DC
  Administered 2016-11-27 – 2016-12-01 (×5): 40 mg via INTRAVENOUS
  Filled 2016-11-27 (×5): qty 40

## 2016-11-27 MED ORDER — ATORVASTATIN CALCIUM 40 MG PO TABS
40.0000 mg | ORAL_TABLET | Freq: Every day | ORAL | Status: DC
  Administered 2016-11-29 – 2016-12-02 (×4): 40 mg via ORAL
  Filled 2016-11-27 (×4): qty 1

## 2016-11-27 MED ORDER — STROKE: EARLY STAGES OF RECOVERY BOOK
Freq: Once | Status: AC
  Administered 2016-11-27: 1
  Filled 2016-11-27: qty 1

## 2016-11-27 MED ORDER — ONDANSETRON HCL 4 MG/2ML IJ SOLN
4.0000 mg | Freq: Four times a day (QID) | INTRAMUSCULAR | Status: DC | PRN
  Filled 2016-11-27: qty 2

## 2016-11-27 MED ORDER — NYSTATIN 100000 UNIT/ML MT SUSP
3.0000 mL | Freq: Three times a day (TID) | OROMUCOSAL | Status: DC
  Administered 2016-11-28 – 2016-12-03 (×22): 300000 [IU] via ORAL
  Filled 2016-11-27 (×21): qty 5

## 2016-11-27 MED ORDER — ACETAMINOPHEN 160 MG/5ML PO SOLN: 650.0000 mg | ORAL | Status: DC | PRN

## 2016-11-27 MED ORDER — BUDESONIDE 0.5 MG/2ML IN SUSP
0.5000 mg | Freq: Two times a day (BID) | RESPIRATORY_TRACT | Status: DC
Start: 2016-11-27 — End: 2016-12-03
  Administered 2016-11-27 – 2016-12-03 (×12): 0.5 mg via RESPIRATORY_TRACT
  Filled 2016-11-27 (×13): qty 2

## 2016-11-27 MED ORDER — NITROGLYCERIN 0.4 MG SL SUBL: 0.4000 mg | SUBLINGUAL_TABLET | SUBLINGUAL | Status: DC | PRN

## 2016-11-27 NOTE — ED Notes (Signed)
Neuro admitting at bedside.

## 2016-11-27 NOTE — ED Notes (Addendum)
Femoral and pedal pulse  minute marked. Attempted to start foley attempt unsuccessful.

## 2016-11-27 NOTE — ED Provider Notes (Signed)
Pt seen and evaluated with K. Humes PA-C. Pt is aphasic. History is via interpretation from family--thai speaking. Pt with multiple recent CVA. Now aphasic. Change noted at 16:00.  Ct with new MCA density, ischemic infarce, and hemorrhagic conversions of 2 prior areas of ischemic CVA. Neurology consulted. Planning diffusion MRI.   Rolland Porter, MD 11/27/16 [phone]

## 2016-11-27 NOTE — Anesthesia Postprocedure Evaluation (Signed)
Anesthesia Post Note  Patient: [name]  Procedure(s) Performed: IR WITH ANESTHESIA (N/A )     Patient location during evaluation: ICU Anesthesia Type: General Level of consciousness: sedated Pain management: pain level controlled Vital Signs Assessment: post-procedure vital signs reviewed and stable Respiratory status: patient remains intubated per anesthesia plan Cardiovascular status: stable Postop Assessment: no apparent nausea or vomiting Anesthetic complications: no    Last Vitals:  Vitals:   11/27/16 0530 11/27/16 0545  BP: 118/77 (!) 96/57  Pulse:  (!) 27  Resp: 20 20  Temp:    SpO2:  99%    Last Pain:  Vitals:   11/26/16 2147  TempSrc: Oral                 Beryle Lathe

## 2016-11-27 NOTE — Progress Notes (Signed)
Pt's INR down to 1.67. MD paged and still wants to wait until tomorrow to pull the sheath. Ellenora Talton, [place] Scrape, RN

## 2016-11-27 NOTE — Progress Notes (Signed)
SLP Cancellation Note  Patient Details Name: Demaje Cuartas MRN: [id number] DOB: 26-May-1926   Cancelled treatment:       Reason Eval/Treat Not Completed: Patient not medically ready. Intubated. Will follow for readiness.    Deloros Beretta, Riley Nearing 11/27/2016, 9:32 AM

## 2016-11-27 NOTE — Progress Notes (Signed)
Patient ID: [name], male   DOB: [birth year], 83 y.o.   MRN: [id number] 83 yr old with acute onset of Rt sided weakness and aphasia .LSW 400pm. Premorbid Rankin score ?2 to3. CT head No fresh ICH .[name] Small hemorrhagic conversion in recent ischemia in the Lt caudate head and larger area in the lt parietal occipital region. . CTP . CBF <30 % vol 55ml and Tmax>6.0s 129 vol. Mismatch of 81 ml  ,with a ratio of 2.7. CTA Lt MCA M1 occlusion. Findings discussed with patients daughter who speaks english. Option of endovascular revascularization to potentially prevent further neurological injury was discussed. The procedure ,risks of ICH 10 to 15 %,worsening neurological function ,vent dependency,death and inability to revascularize were all reviewed. Daughter and son also informed that patients age and premorbid recent strokes and medical conditions put the patient at relatively increased risk of complications ,not just limited to the those mentioned above, and despite complete revascularization  patient unlikely to return to his functional status prior to this neurological event... They expressed complete understanding and would like to proceed with the procedure. Informed witnessed consent obtained.[name] S.Wyman Meschke MD

## 2016-11-27 NOTE — Transfer of Care (Signed)
Immediate Anesthesia Transfer of Care Note  Patient: [name]  Procedure(s) Performed: IR WITH ANESTHESIA (N/A )  Patient Location: ICU  Anesthesia Type:General  Level of Consciousness: Patient remains intubated per anesthesia plan  Airway & Oxygen Therapy: Patient remains intubated per anesthesia plan and Patient placed on Ventilator (see vital sign flow sheet for setting)  Post-op Assessment: Report given to RN and Post -op Vital signs reviewed and stable  Post vital signs: Reviewed and stable  Last Vitals:  Vitals:   11/27/16 0115 11/27/16 0130  BP: (!) 157/85 (!) 158/93  Pulse: 89 98  Resp: (!) 37 (!) 22  Temp:    SpO2: 100% 100%    Last Pain:  Vitals:   11/26/16 2147  TempSrc: Oral         Complications: No apparent anesthesia complications

## 2016-11-27 NOTE — Sedation Documentation (Signed)
Pt placement called for bed assignment. No bed available. They are working on moving a pt to make a bed. Spoke with [employer].

## 2016-11-27 NOTE — Anesthesia Procedure Notes (Signed)
Procedure Name: Intubation Date/Time: 11/27/2016 3:11 AM Performed by: Brien Mates D Pre-anesthesia Checklist: Patient identified, Emergency Drugs available, Suction available, Patient being monitored and Timeout performed Patient Re-evaluated:Patient Re-evaluated prior to induction Oxygen Delivery Method: Circle system utilized Preoxygenation: Pre-oxygenation with 100% oxygen Induction Type: IV induction, Cricoid Pressure applied and Rapid sequence Laryngoscope Size: Miller and 2 Grade View: Grade I Tube type: Subglottic suction tube Tube size: 7.5 mm Number of attempts: 1 Airway Equipment and Method: Stylet Placement Confirmation: ETT inserted through vocal cords under direct vision,  positive ETCO2 and breath sounds checked- equal and bilateral Secured at: 21 cm Tube secured with: Tape Dental Injury: Teeth and Oropharynx as per pre-operative assessment

## 2016-11-27 NOTE — Progress Notes (Signed)
Pt transported to and from CT uneventful trip. transported on 100% FiO2. No complications noted.

## 2016-11-27 NOTE — Progress Notes (Signed)
STROKE TEAM PROGRESS NOTE   HISTORY OF PRESENT ILLNESS (per record) [name] is an 83 y.o. male with a past history of multiple strokes presenting with acute onset of aphasia and right sided weakness. He has an extensive stroke history. He has atrial fibrillation and is on Xarelto for this. Today at 2000 family noticed that the patient had difficulty speaking. It was hard for the family to understand what he was saying. He also exhibited new right sided weakness. LKN was 1600. His most recent stroke prior to today's presentation was last month, with right sided deficits and slurred speech.   Patient was not administered IV t-PA secondary to anticoagulation. Underwent endovascular clot retraction procedure He was admitted to the neuro ICU for further evaluation and treatment.   SUBJECTIVE (INTERVAL HISTORY) He is intubated but awake spontaneously. He does not respond to verbal commands or mimic movements. Both sides are moving spontaneously.BP adequately controlled. No family at bedside.   OBJECTIVE Temp:  [94.2 F (34.6 C)-99.3 F (37.4 C)] 99.3 F (37.4 C) (10/18 1600) Pulse Rate:  [27-144] 101 (10/18 1730) Cardiac Rhythm: Atrial fibrillation (10/18 0730) Resp:  [14-37] 20 (10/18 1730) BP: (75-193)/(32-136) 135/78 (10/18 1730) SpO2:  [47 %-100 %] 100 % (10/18 1730) FiO2 (%):  [40 %-100 %] 40 % (10/18 1656) Weight:  [140 lb 14 oz (63.9 kg)-150 lb (68 kg)] 140 lb 14 oz (63.9 kg) (10/18 0500)  CBC:   Recent Labs Lab 11/26/16 2256 11/26/16 2307  WBC 7.4  --   NEUTROABS 5.5  --   HGB 9.5* 9.9*  HCT 28.7* 29.0*  MCV 92.3  --   PLT 196  --     Basic Metabolic Panel:   Recent Labs Lab 11/26/16 2256 11/26/16 2307  NA 131* 134*  K 4.5 3.8  CL 99* 96*  CO2 24  --   GLUCOSE 130* 125*  BUN 8 10  CREATININE 1.37* 1.40*  CALCIUM 9.0  --     Lipid Panel:     Component Value Date/Time   CHOL 83 10/19/2016 0423   CHOL 122 09/09/2013 0815   TRIG 74 11/27/2016 1035    HDL 25 (L) 10/19/2016 0423   HDL 35 (L) 09/09/2013 0815   CHOLHDL 3.3 10/19/2016 0423   VLDL 13 10/19/2016 0423   LDLCALC 45 10/19/2016 0423   LDLCALC 50 09/09/2013 0815   HgbA1c:  Lab Results  Component Value Date   HGBA1C 6.0 (H) 10/19/2016   Urine Drug Screen:     Component Value Date/Time   LABOPIA NONE DETECTED 10/08/2016 1937   COCAINSCRNUR NONE DETECTED 10/08/2016 1937   LABBENZ NONE DETECTED 10/08/2016 1937   AMPHETMU NONE DETECTED 10/08/2016 1937   THCU NONE DETECTED 10/08/2016 1937   LABBARB NONE DETECTED 10/08/2016 1937    Alcohol Level     Component Value Date/Time   ETH <5 10/09/2016 0100    IMAGING  Ct Angio Head W Or Wo Contrast Ct Angio Neck W Or Wo Contrast Ct Cerebral Perfusion W Contrast 11/27/2016 1. Left middle cerebral artery proximal M2 inferior division occlusion. Poor collateral circulation in the left posterior MCA distribution. 2. CT perfusion demonstrating left posterior MCA distribution core infarct with volume of 48 cc and ischemic penumbra of 81 cc by automated RAPID post processing. 3. Patent carotid and vertebral arteries of the neck. No high-grade stenosis by NASCET criteria, aneurysm, or dissection. 4. Stented left distal common carotid artery to proximal left internal carotid artery. The stent appears widely patent.  5. 4 mm rounded dilation of left posterior communicating artery ICA origin compatible with aneurysm. 6. Left V4 segment focal ectasia measuring 6 mm with ulcerated mural plaque. 7. 4.2 cm ascending aortic aneurysm. Recommend annual imaging followup by CTA or MRA. This recommendation follows 2010 ACCF/AHA/AATS/ACR/ASA/SCA/SCAI/SIR/STS/SVM Guidelines for the Diagnosis and Management of Patients with Thoracic Aortic Disease. Circulation. 2010; 121: Z610-[id number]. 8. Moderate to severe mixed plaque of the aorta with multiple plaque ulcerations.  Ct Head Wo Contrast 11/26/2016 1. New foci of hypoattenuation within the left  parietal cortex and left caudate head compatible with interval late acute/early subacute infarction from 10/21/2016. Subcentimeter focus of hemorrhage within the new left caudate head infarction. No significant mass effect. 2. Hemorrhagic conversion of left parietooccipital infarction. No significant mass effect or extra-axial collection. 3. Multiple chronic infarcts as described are stable. 4. Hyperdense distal left MCA in the MCA cistern may represent thrombus. 11/27/2016 1. Enhancement of left caudate head, scattered foci of the left frontal parietal lobes, and left lateral temporal lobe areas of acute infarction correspond to infarct core on the prior CT perfusion. No new area of acute infarction identified at this time. No significant mass effect. 2. Increased density within the left temporoparietal lobe hemorrhagic infarction probably represents superimposed enhancement. Evaluation for new hemorrhage is limited in the presence of contrast. 3. Soft tissue thickening of right external auditory canal, correlate for possible otitis externa. No bony destructive changes. 11/27/2016 1. Increased subarachnoid contrast/hemorrhage in the lower left sylvian fissure and perimesencephalic cistern, overall small volume. 2. Contrast staining in the infarcted left temporoparietal cortex after recent procedure. 3. Petechial hemorrhage in subacute infarcts in the left caudate head and left parietooccipital cortex as seen on admission noncontrast head CT. 4. Moderate acute lateral left frontal infarct.  Mr Brain Wo Contrast 11/27/2016 1. Acute left lateral frontal lobe infarct. 2. Extensive subacute and chronic ischemia as above, including a large subacute left temporal lobe infarct which is new from 10/18/2016. Associated petechial hemorrhage. 3. Small volume subarachnoid hemorrhage in the left perimesencephalic cistern.  Portable Chest Xray 11/27/2016 1. Endotracheal tube tip noted 2.7 cm above the  carina. 2. Cardiomegaly with normal pulmonary vascularity. 3. Low lung volumes with mild bilateral atelectatic changes .    PHYSICAL EXAM  Temp:  [94.2 F (34.6 C)-99.3 F (37.4 C)] 99.3 F (37.4 C) (10/18 1600) Pulse Rate:  [27-144] 101 (10/18 1730) Resp:  [14-37] 20 (10/18 1730) BP: (75-193)/(32-136) 135/78 (10/18 1730) SpO2:  [47 %-100 %] 100 % (10/18 1730) FiO2 (%):  [40 %-100 %] 40 % (10/18 1656) Weight:  [140 lb 14 oz (63.9 kg)-150 lb (68 kg)] 140 lb 14 oz (63.9 kg) (10/18 0500)  General - Intubated, awake spontaneously and moving around, not following commands or answering questions  Cardiovascular - Regular rate and rhythm.  Mental Status -  Assessment limited, level of consciousness awake and moving  Cranial Nerves II - XII - III, IV, VI - Gazing forward and leftward preferentially. VII - Face symmetric at rest.  Motor Strength - Strength is grossly intact on left side. RUE moving spontaneously at least 2/5 and RLE withdraws briskly to pain. Motor Tone - Muscle tone was fair to normal in R extremities.  Reflexes - The patient's reflexes were 1+ in all extremities and he had no pathological reflexes.  Sensory - Not assessed.  Coordination - Not assessed.  Gait and Station - Not assessed   ASSESSMENT/PLAN Mr. Pura Spicehaeng Fishel is a 83 y.o. male with history of multiple  strokes, atrial fibrillation, hypertension, CAD, CHF presenting with aphasia and right sided weakness. He did not receive IV t-PA due to anticoagulation but underwent endovascular clot retraction.   Stroke:  left lateral frontal infarct embolic secondary to proximal M2  Occlusion s/p thrombectomy with revascularization  Resultant  Some degree of R weakness, awaiting extubation and evaluation  CT head New foci of hypoattenuation within the left parietal cortex and left caudate head compatible with interval late acute/early subacute infarction from 10/21/2016.  MRI head Acute left lateral frontal  lobe infarct. Associated petechial hemorrhage.  CTA head Left middle cerebral artery proximal M2 inferior division occlusion.  CTA Neck Stented left distal common carotid artery to proximal left internal carotid artery  Carotid Doppler  CTA Neck  2D Echo  From 10/19/2016 LVEF 50-55%, no embolic source  LDL pending  [id number] pending  SCDs for VTE prophylaxis Diet NPO time specified  Xarelto (rivaroxaban) daily prior to admission, now on No antithrombotic  Ongoing aggressive stroke risk factor management  Therapy recommendations:  Pending  Disposition:  Pending  Hypertension  Stable  BP goal to <180 SBP  Long-term BP goal normotensive <130/90  Hyperlipidemia  Home meds:  Atorvastatin 40mg , resumed in hospital  LDL pending, goal < 70  Continue statin at discharge  Diabetes  HgbA1c pending, goal < 7.0  Other Stroke Risk Factors  Advanced age   Hx stroke/TIA  Coronary artery disease  Other Active Problems  Small secondary petechial hemorrhage in region of infarct. Holding home anticoagulation at this time. Awaiting extubation after groin sheath removal if continues good spontaneous breathing.  Hospital day # 0  I have personally examined this patient, reviewed notes, independently viewed imaging studies, participated in medical decision making and plan of care.ROS completed by me personally and pertinent positives fully documented  I have made any additions or clarifications directly to the above note. He presented with left M 1 occlusion and underwent mech thrombectomy. Recommend strict BP control. DC groin sheath when INR belo 2 and extubate later, Check MRI. No family at bedside. D/W Dr Merlene Pulling.This patient is critically ill and at significant risk of neurological worsening, death and care requires constant monitoring of vital signs, hemodynamics,respiratory and cardiac monitoring, extensive review of multiple databases, frequent neurological assessment, discussion  with family, other specialists and medical decision making of high complexity.I have made any additions or clarifications directly to the above note.This critical care time does not reflect procedure time, or teaching time or supervisory time of PA/NP/Med Resident etc but could involve care discussion time.  I spent 40 minutes of neurocritical care time  in the care of  this patient.     Delia Heady, MD Medical Director [facility] Pager: [phone] 11/27/2016 11:26 PM   To contact Stroke Continuity provider, please refer to [email or web address]. After hours, contact General Neurology

## 2016-11-27 NOTE — Consult Note (Addendum)
Admission History and Physical  Referring Physician: Dr. Jeneen Rinks    Chief Complaint: Acute onset of aphasia  HPI: Dashiell Franchino is an 83 y.o. male with a past history of multiple strokes presenting with acute onset of aphasia and right sided weakness. He has an extensive stroke history. He has atrial fibrillation and is on Xarelto for this. Today at 2000 family noticed that the patient had difficulty speaking. It was hard for the family to understand what he was saying. He also exhibited new right sided weakness. LKN was 1600. His most recent stroke prior to today's presentation was last month, with right sided deficits and slurred speech.   Labs: INR is elevated at 2.75. No leukocytosis. BUN/CR 8 / 1.37. Na 131.    Dr. Clydene Fake recent note has been reviewed: "Initial visit 09/12/2016 : Mr Plog is a 83 year male from [place] originally who is seen today for first office follow-up visit following hospital admission for stroke in June 2018. He is accompanied by his daughter as well as a [place] language interpreter who provide most of the history. I also reviewed electronic medical records and personally reviewed imaging films. Kerin Khamjumphonis a 83 y.o.malewith multiple medical problems including atrial fibrillation on Xarelto who presents with left-sided weakness and decreased responsiveness. He went to bed normal at 10 PM, as wife reports that when he woke up he was unable to lift "either arm." His brought into the emergency room where he was activated as code stroke. On my exam, he was able to hold his right arm up without difficulty but his left arm had significant weakness. He was taken for a stat CT perfusion which did not show any ischemic penumbra, and CT angiogram which shows right M1 stenosis without occlusion. MRI scan of the brain showed a small region of acute infarction affecting the gyral surface of the precentral gyrus with extensive chronic small vessel ischemic changes as well  as old hemosiderin deposition in the right lateral thalamus posterior limb internal capsule and left frontal cortical subcortical region. Transthoracic echo showed ejection fraction 45-50% with moderate pulmonary hypertension. LDL cholesterol is 42 mg percent. Hemoglobin A1c was 6.4. Patient was previously on Xarelto for atrial fibrillation this was switched to Pradaxa. Patient is doing well. Is staying at home with his family. He is able to ambulate independently. The family feels that he is recovered completely back to his baseline. Patient is quite active and eats healthy. His blood pressure is well controlled today it is 138/81. He is tolerating Lipitor well without muscle aches and pains. He in fact has no complaints today when asked through the interpreter. Update 11/05/16 : Patient is seen urgently today upon request from the daughter and primary physician. Patient was readmitted to the hospital on 10/08/16 with increased weakness and confusion. I have personally reviewed imaging for lemons in hospital workup. MRI scan of the brain showed a new 1 cm right posterior limb internal capsule infarct. CT angiogram of the head and neck showed 75% right middle cerebral artery stenosis and 70% stenosis in the left ICA stent. Transthoracic echo showed slightly diminished ejection fraction of 45-50%. LDL cholesterol 50 mg percent and hemoglobin A1c was 6.4. Patient was continued on his Pradaxa and aspirin 81 mg and discharged home with home therapies. The patient did not do well and while working with physical therapy and 10/18/16 was found to be more weak and not able to talk and speech was slurred. He was readmitted an MRI scan of the  brain on 10/18/16 which showed a new acute infarct in the left parieto-occipital cortex as well as some watershed areas of acute infarct left frontal and parietal lobes as well as left superior thalamus. The previously seen subacute right posterior limb internal capsule infarct is unchanged.  CT perfusion scan did not show significant salvageable penumbra tissue. Patient had mild hyponatremia. Patient has since been discharged and was sent home and getting home physical occupational therapy. The daughter feels that his dizzy and does not get up and walk a lot. He is tired and fatigued and quite sleepy and difficult to arouse. His primary physician is changes Pradaxa to Xarelto. Patient has not been eating and drinking very well as well. The daughter feels that he is more confused and does not speak as much as he used to prior to his latest stroke."  LSN: 1600 tPA Given: No: Out of IV tPA time window  Past Medical History:  Diagnosis Date  . AAA (abdominal aortic aneurysm) ([place])   . Anemia   . Asthma   . Carotid artery disease ([place])   . CHF (congestive heart failure) ([place])   . Coronary artery disease   . Depression   . Gout   . H. pylori infection   . Hard of hearing   . Hiatal hernia   . Hyperplasia, prostate   . Hypertension   . PUD (peptic ulcer disease)   . Renal failure, acute ([place]) 11/08/2012    Past Surgical History:  Procedure Laterality Date  . CAROTID STENT INSERTION  2015  . CAROTID STENT INSERTION N/A 05/09/2013   Procedure: CAROTID STENT INSERTION;  Surgeon: Lorretta Harp, MD;  Location: [facility] CATH LAB;  Service: Cardiovascular;  Laterality: N/A;  . ESOPHAGOGASTRODUODENOSCOPY  06/13/2008,12/31/12  . LEFT HEART CATHETERIZATION WITH CORONARY ANGIOGRAM N/A 05/04/2013   Procedure: LEFT HEART CATHETERIZATION WITH CORONARY ANGIOGRAM;  Surgeon: Peter M Martinique, MD;  Location: [facility] CATH LAB;  Service: Cardiovascular;  Laterality: N/A;    Family History  Problem Relation Age of Onset  . Colon cancer Neg Hx    Social History:  reports that he quit smoking about 14 years ago. He has never used smokeless tobacco. He reports that he does not drink alcohol or use drugs.  Allergies:  Allergies  Allergen Reactions  . Levofloxacin Anaphylaxis and Other (See Comments)     Patient told his daughter that it made him "feel worse than he already did" (overall) Headache also (has refused to take anymore)  . Eliquis [Apixaban] Itching and Swelling  . Phenazopyridine Hcl Other (See Comments)    Unknown reaction  . Septra [Sulfamethoxazole-Trimethoprim] Nausea And Vomiting and Other (See Comments)    GI Upset  . Latex Rash  . Tape Rash    Home Medications:    ROS: As per HPI.   Physical Examination: Blood pressure (!) 149/94, pulse 86, temperature 97.8 F (36.6 C), temperature source Oral, resp. rate (!) 21, height 5' (1.524 m), weight 68 kg (150 lb), SpO2 99 %.  HEENT: [place]/AT Lungs: Respirations unlabored Ext: No edema  Neurologic Examination: Mental Status: Family assists with language interpretation in [place]. The patient is alert and nonverbal. No speech output despite frequent requests. Does not follow most verbal commands but will occasionally respond appropriately to pantomimed instructions. Prominent right sided neglect.  Cranial Nerves: II:  Does not attend to right sided visual stimuli. PERRL.  III,IV, VI: Ptosis not present. Leftward gaze deviation. Does not gaze to the right volitionally. Eyes cross midline  slightly to the right with oculocephalic maneuver. No nystagmus.  V,VII: Face grossly symmetric; does not smile to command or grimace to irritative stimuli.  VIII: Alerts to vocal stimuli IX,X: Unable to cooperate with assessment  XI: Preferentially rotates head to left.  XII: Unable to cooperate with assessment  Motor: RUE: 1-2/5 proximal and distal RLE: No volitional movement. Triple flexion response to noxious LUE and LLE: 5/5 Sensory: Grimaces to noxious applied to LUE and LLE. No response to noxious on the right Deep Tendon Reflexes:  2+ bilateral upper extremities 2+ patellae 0 achilles Right toe: Triple flexion to plantar stimulation Left toe: downgoing Cerebellar: Unable to cooperate with assessment  Gait: Deferred  Results  for orders placed or performed during the hospital encounter of 11/26/16 (from the past 48 hour(s))  Protime-INR     Status: Abnormal   Collection Time: 11/26/16 10:56 PM  Result Value Ref Range   Prothrombin Time 28.9 (H) 11.4 - 15.2 seconds   INR 2.75   APTT     Status: Abnormal   Collection Time: 11/26/16 10:56 PM  Result Value Ref Range   aPTT 70 (H) 24 - 36 seconds    Comment:        IF BASELINE aPTT IS ELEVATED, SUGGEST PATIENT RISK ASSESSMENT BE USED TO DETERMINE APPROPRIATE ANTICOAGULANT THERAPY.   CBC     Status: Abnormal   Collection Time: 11/26/16 10:56 PM  Result Value Ref Range   WBC 7.4 4.0 - 10.5 K/uL   RBC 3.11 (L) 4.22 - 5.81 MIL/uL   Hemoglobin 9.5 (L) 13.0 - 17.0 g/dL   HCT 28.7 (L) 39.0 - 52.0 %   MCV 92.3 78.0 - 100.0 fL   MCH 30.5 26.0 - 34.0 pg   MCHC 33.1 30.0 - 36.0 g/dL   RDW 14.5 11.5 - 15.5 %   Platelets 196 150 - 400 K/uL  Differential     Status: None   Collection Time: 11/26/16 10:56 PM  Result Value Ref Range   Neutrophils Relative % 75 %   Neutro Abs 5.5 1.7 - 7.7 K/uL   Lymphocytes Relative 17 %   Lymphs Abs 1.3 0.7 - 4.0 K/uL   Monocytes Relative 6 %   Monocytes Absolute 0.5 0.1 - 1.0 K/uL   Eosinophils Relative 2 %   Eosinophils Absolute 0.2 0.0 - 0.7 K/uL   Basophils Relative 0 %   Basophils Absolute 0.0 0.0 - 0.1 K/uL  Comprehensive metabolic panel     Status: Abnormal   Collection Time: 11/26/16 10:56 PM  Result Value Ref Range   Sodium 131 (L) 135 - 145 mmol/L   Potassium 4.5 3.5 - 5.1 mmol/L   Chloride 99 (L) 101 - 111 mmol/L   CO2 24 22 - 32 mmol/L   Glucose, Bld 130 (H) 65 - 99 mg/dL   BUN 8 6 - 20 mg/dL   Creatinine, Ser 1.37 (H) 0.61 - 1.24 mg/dL   Calcium 9.0 8.9 - 10.3 mg/dL   Total Protein 7.6 6.5 - 8.1 g/dL   Albumin 3.3 (L) 3.5 - 5.0 g/dL   AST 28 15 - 41 U/L   ALT 16 (L) 17 - 63 U/L   Alkaline Phosphatase 76 38 - 126 U/L   Total Bilirubin 1.0 0.3 - 1.2 mg/dL   GFR calc non Af Amer 44 (L) >60 mL/min   GFR  calc Af Amer 51 (L) >60 mL/min    Comment: (NOTE) The eGFR has been calculated using the CKD EPI  equation. This calculation has not been validated in all clinical situations. eGFR's persistently <60 mL/min signify possible Chronic Kidney Disease.    Anion gap 8 5 - 15  I-stat troponin, ED     Status: None   Collection Time: 11/26/16 11:05 PM  Result Value Ref Range   Troponin i, poc 0.00 0.00 - 0.08 ng/mL   Comment 3            Comment: Due to the release kinetics of cTnI, a negative result within the first hours of the onset of symptoms does not rule out myocardial infarction with certainty. If myocardial infarction is still suspected, repeat the test at appropriate intervals.   I-Stat Chem 8, ED     Status: Abnormal   Collection Time: 11/26/16 11:07 PM  Result Value Ref Range   Sodium 134 (L) 135 - 145 mmol/L   Potassium 3.8 3.5 - 5.1 mmol/L   Chloride 96 (L) 101 - 111 mmol/L   BUN 10 6 - 20 mg/dL   Creatinine, Ser 1.40 (H) 0.61 - 1.24 mg/dL   Glucose, Bld 125 (H) 65 - 99 mg/dL   Calcium, Ion 1.16 1.15 - 1.40 mmol/L   TCO2 26 22 - 32 mmol/L   Hemoglobin 9.9 (L) 13.0 - 17.0 g/dL   HCT 29.0 (L) 39.0 - 52.0 %   Ct Head Wo Contrast  Result Date: 11/26/2016 CLINICAL DATA:  83 y/o M; inability to talk. Week with right-sided numbness. EXAM: CT HEAD WITHOUT CONTRAST TECHNIQUE: Contiguous axial images were obtained from the base of the skull through the vertex without intravenous contrast. COMPARISON:  10/21/2016 CT head and 10/18/2016 MRI of the head. FINDINGS: Brain: Hemorrhagic conversion of left parietooccipital infarction without significant mass effect or extra-axial collection. Stable small areas of now chronic infarction in right temporal periventricular white matter, left thalamus, and scattered through left frontal and parietal cortices. Stable bilateral frontal and left parietal chronic infarctions from prior study. Stable chronic microvascular ischemic changes and  parenchymal volume loss of the brain. Subcentimeter focus of increased density within the left caudate head with associated hypoattenuation new from prior study compatible with a small hemorrhagic infarction (series 7, image 17). New small focus of hypoattenuation involving left lateral parietal cortex compatible with interval acute/ early subacute infarction (series 7, image 23). Vascular: Calcific atherosclerosis of carotid siphons. Left MCA hyperdensity in left lateral MCA cistern (series 7, image 11). Skull: Normal. Negative for fracture or focal lesion. Sinuses/Orbits: No acute finding. Other: None. IMPRESSION: 1. New foci of hypoattenuation within the left parietal cortex and left caudate head compatible with interval late acute/early subacute infarction from 10/21/2016. Subcentimeter focus of hemorrhage within the new left caudate head infarction. No significant mass effect. 2. Hemorrhagic conversion of left parietooccipital infarction. No significant mass effect or extra-axial collection. 3. Multiple chronic infarcts as described are stable. 4. Hyperdense distal left MCA in the MCA cistern may represent thrombus. These results were called by telephone at the time of interpretation on 11/26/2016 at 11:50 pm to Dr. Tanna Furry , who verbally acknowledged these results. Electronically Signed   By: Kristine Garbe M.D.   On: 11/26/2016 23:54    Assessment: 83 y.o. male presenting with acute onset of aphasia and right sided weakness beginning at 4 PM on Wednesday 1. Exam reveals findings consistent with left MCA ischemic infarction/hypoperfusion. NIHSS = 17 2. CT head shows dense left M2 segment consistent with occlusion. New foci of hypoattenuation within the left parietal cortex and left caudate  head compatible with interval late acute/early subacute infarction from 10/21/2016. Subcentimeter focus of hemorrhage within the new left caudate head infarction. No significant mass effect. 2. Hemorrhagic  conversion of left parietooccipital infarction. No significant mass effect or extra-axial collection. 3. Multiple chronic infarcts as described are stable. 4. Hyperdense distal left MCA in the MCA cistern may represent thrombus. 3. CTA demonstrates left middle cerebral artery proximal M2 inferior division occlusion with poor collateral circulation in the left posterior MCA distribution. 4. CT perfusion demonstrates left posterior MCA distribution core infarct with volume of 48 cc and ischemic penumbra of 81 cc by automated RAPID post processing. 5. Additional CTA results: Patent carotid and vertebral arteries of the neck. No high-grade stenosis, aneurysm, or dissection. Stented left distal common carotid artery to proximal left internal carotid artery. The stent appears widely patent. 4 mm rounded dilation of left posterior communicating artery ICA origin compatible with aneurysm. Left V4 segment focal ectasia measuring 6 mm with ulcerated mural plaque. 4.2 cm ascending aortic aneurysm. Moderate to severe mixed plaque of the aorta with multiple plaque ulcerations.  Plan: 1. Discussed with family, who wish to proceed with endovascular clot retraction procedure. Risks/benefits described to family and all questions answered.  2. Discussed case with Dr. Estanislado Pandy. VIR team is being called in.  3. Following procedure, admit to ICU under Neurology service for supportive care, BP management, nutrition, close monitoring of vitals and frequent neuro checks 4. Will need PT consult, OT consult, Speech consult and possibly rehab 5. MRI tomorrow 6. Telemetry monitoring  50 minutes spent in the emergent Neurological evaluation and management of this critically ill acute stroke patient  '@Electronically'  signed: Dr. Kerney Elbe 11/27/2016, 12:09 AM

## 2016-11-27 NOTE — Progress Notes (Signed)
Patient transported from CT to 4N with no complications.

## 2016-11-27 NOTE — Progress Notes (Signed)
PT Cancellation Note  Patient Details Name: [name] MRN: [id number] DOB: 16-Feb-1926   Cancelled Treatment:    Reason Eval/Treat Not Completed: Patient not medically ready. Pt currently on bedrest. Will await increased activity orders prior to initiating PT eval.    Marylynn Pearson 11/27/2016, 7:01 AM   Conni Slipper, PT, DPT Acute Rehabilitation Services Pager: [phone]

## 2016-11-27 NOTE — Sedation Documentation (Signed)
Report called to Arley Phenix, Charity fundraiser. Pt transport to 4N30 on bed with monitor, vent, RN, CRNA and RT.

## 2016-11-27 NOTE — Sedation Documentation (Signed)
Anesthesia case 

## 2016-11-27 NOTE — ED Notes (Addendum)
Pt arrived to IR

## 2016-11-27 NOTE — Procedures (Signed)
S/P Lt common carotid arteriogram followed by complete revascularization of occluded Lt MCA with x 1pass with 33mm x 40 mm solitaire FR retriever device achieving a TICI 3 reperfusion.

## 2016-11-27 NOTE — Progress Notes (Signed)
Interventional Radiology here at bedside to remove 9Fr right groin sheath.  Pt id'ed by name and DOB.  Sheath removed, V-Pad in place, manual pressure held until hemostasis achieved, . No immediate complications noted, no hematoma, no bleeding from groin site.  Distal pulses intact at right DP +1, PT +1. RN Sarah here for post sheath report. mja/jkc

## 2016-11-27 NOTE — Progress Notes (Signed)
Referring Physician(s):  Dr. Caryl Pina  Supervising Physician: Julieanne Cotton  Patient Status:  [facility] - In-pt  Chief Complaint: L MCA CVA  Subjective: Intubated, sedated On Xarelto at home; sheath to remain in place.    Allergies: Levofloxacin; Eliquis [apixaban]; Phenazopyridine hcl; Septra [sulfamethoxazole-trimethoprim]; Latex; and Tape  Medications: Prior to Admission medications   Medication Sig Start Date End Date Taking? Authorizing Provider  allopurinol (ZYLOPRIM) 300 MG tablet Take 300 mg by mouth daily. To prevent gout   Yes [provider]  arformoterol (BROVANA) 15 MCG/2ML NEBU Take 2 mLs (15 mcg total) by nebulization 2 (two) times daily. 10/10/15  Yes Rhetta Mura, MD  arformoterol (BROVANA) 15 MCG/2ML NEBU Inhale 2 mLs into the lungs 2 (two) times daily. 11/11/16  Yes [provider]  aspirin EC 81 MG tablet Take 1 tablet (81 mg total) by mouth daily. 09/08/13  Yes Marvel Plan, MD  atorvastatin (LIPITOR) 40 MG tablet Take 1 tablet (40 mg total) by mouth daily at 6 PM. 05/11/13  Yes Sharol Harness, Brittainy M, PA-C  budesonide (PULMICORT) 0.5 MG/2ML nebulizer solution Take 2 mLs (0.5 mg total) by nebulization 2 (two) times daily. 10/10/15  Yes Rhetta Mura, MD  carvedilol (COREG) 6.25 MG tablet Take 1 tablet (6.25 mg total) by mouth 2 (two) times daily with a meal. 07/23/16  Yes Runell Gess, MD  esomeprazole (NEXIUM) 40 MG capsule Take 1 capsule (40 mg total) by mouth 2 (two) times daily before a meal. 07/21/16  Yes Johnson, Clanford L, MD  HYDROcodone-homatropine (HYCODAN) 5-1.5 MG/5ML syrup Take 5 mLs by mouth every 6 (six) hours as needed for cough.   Yes [provider]  ipratropium-albuterol (DUONEB) 0.5-2.5 (3) MG/3ML SOLN Take 3 mLs by nebulization 2 (two) times daily as needed (for shortness of breath or wheezing).   Yes [provider]  isosorbide mononitrate (IMDUR) 30 MG 24 hr tablet Take 30 mg by mouth once  daily Patient taking differently: Take 30 mg by mouth daily. For control of heart pain 07/31/15  Yes Margarita Grizzle, MD  meclizine (ANTIVERT) 25 MG tablet Take 25 mg by mouth 3 (three) times daily as needed for dizziness.  09/05/16  Yes [provider]  NITROSTAT 0.4 MG SL tablet PLACE 1 TABLET UNDER THE TONGUE EVERY 5 MINUTES FOR 3 DOSES AS NEEDED FOR CHEST PAIN Patient taking differently: PLACE 0.4 MG UNDER THE TONGUE EVERY 5 MINUTES FOR 3 DOSES AS NEEDED FOR CHEST PAIN 10/29/15  Yes Runell Gess, MD  nystatin (MYCOSTATIN) 100000 UNIT/ML suspension Take 3 mLs by mouth 4 (four) times daily. 11/10/16  Yes [provider]  tamsulosin (FLOMAX) 0.4 MG CAPS capsule Take 0.4 mg by mouth daily. To improve bladder function   Yes [provider]  XARELTO 15 MG TABS tablet Take 15 mg by mouth daily.  10/24/16  Yes [provider]  dabigatran (PRADAXA) 75 MG CAPS capsule Take 1 capsule (75 mg total) by mouth every 12 (twelve) hours. Patient not taking: Reported on 11/26/2016 10/21/16 11/20/16  Edsel Petrin, DO     Vital Signs: BP 112/72   Pulse 84   Temp 98.1 F (36.7 C) (Axillary)   Resp 20   Ht 5\' 5"  (1.651 m)   Wt 140 lb 14 oz (63.9 kg)   SpO2 100%   BMI 23.44 kg/m   Physical Exam  Intubated, sedated Unable to assess neuro status due to sedation.  Groin intact with sheath in place.  Dressing clean  and dry.  Distal pulses intact.   Imaging: Ct Angio Head W Or Wo Contrast  Result Date: 11/27/2016 CLINICAL DATA:  83 y/o  M; code stroke. EXAM: CT ANGIOGRAPHY HEAD AND NECK CT PERFUSION BRAIN TECHNIQUE: Multidetector CT imaging of the head and neck was performed using the standard protocol during bolus administration of intravenous contrast. Multiplanar CT image reconstructions and MIPs were obtained to evaluate the vascular anatomy. Carotid stenosis measurements (when applicable) are obtained utilizing NASCET criteria, using the distal internal carotid  diameter as the denominator. Multiphase CT imaging of the brain was performed following IV bolus contrast injection. Subsequent parametric perfusion maps were calculated using RAPID software. CONTRAST:  90 cc Isovue 370 COMPARISON:  None. FINDINGS: CTA NECK FINDINGS Aortic arch: Ascending aortic aneurysm measuring up to 4.2 cm. Moderate to severe mixed plaque of the aortic arch with multiple plaque ulcerations. Great vessel origins are patent without high-grade stenosis. Three-vessel arch. Right carotid system: No evidence of dissection, stenosis (50% or greater) or occlusion. Moderate mixed plaque of the carotid bifurcation without significant stenosis. Left carotid system: No evidence of dissection, stenosis (50% or greater) or occlusion. Stent extending from the distal left common carotid artery to the proximal left internal carotid artery. The stent is widely patent. Severe stenosis of left external artery origin. Vertebral arteries: Left dominant. No evidence of dissection, stenosis (50% or greater) or occlusion. Hairpin loops of V1 segments bilaterally. Skeleton: Moderate cervical spondylosis with multilevel disc and facet degenerative changes. No high-grade bony canal stenosis. Other neck: Negative. Upper chest: Peripheral reticulation of the lung parenchyma compatible fibrosis. Enlarged mediastinal lymph nodes including a left lower paratracheal lymph node measuring 12 mm short axis, probably reactive. Review of the MIP images confirms the above findings CTA HEAD FINDINGS Anterior circulation: Left M2 inferior division proximal occlusion with poor collateral circulation in the left posterior MCA distribution. Left posterior communicating artery ICA origin is rounded and measures 4 mm in diameter compatible with aneurysmal dilatation (series 10, image 106). Otherwise no significant stenosis, occlusion, or aneurysm of the anterior circulation. Posterior circulation: No significant stenosis, occlusion, or  vascular malformation. Left V4 segment focal ectasia measuring 6 mm with ulcerated mural plaque (series 8, image 156). Venous sinuses: As permitted by contrast timing, patent. Anatomic variants: Left fetal PCA. Patent small right posterior communicating artery and anterior communicating artery. Delayed phase: Right mastoid opacification and sclerosis compatible sequelae of chronic otomastoiditis. Review of the MIP images confirms the above findings CT Brain Perfusion Findings: CBF (<30%) Volume: 48mL Perfusion (Tmax>6.0s) volume: 129mL Mismatch Volume: 81mL Infarction Location:Left posterior MCA IMPRESSION: 1. Left middle cerebral artery proximal M2 inferior division occlusion. Poor collateral circulation in the left posterior MCA distribution. 2. CT perfusion demonstrating left posterior MCA distribution core infarct with volume of 48 cc and ischemic penumbra of 81 cc by automated RAPID post processing. 3. Patent carotid and vertebral arteries of the neck. No high-grade stenosis by NASCET criteria, aneurysm, or dissection. 4. Stented left distal common carotid artery to proximal left internal carotid artery. The stent appears widely patent. 5. 4 mm rounded dilation of left posterior communicating artery ICA origin compatible with aneurysm. 6. Left V4 segment focal ectasia measuring 6 mm with ulcerated mural plaque. 7. 4.2 cm ascending aortic aneurysm. Recommend annual imaging followup by CTA or MRA. This recommendation follows 2010 ACCF/AHA/AATS/ACR/ASA/SCA/SCAI/SIR/STS/SVM Guidelines for the Diagnosis and Management of Patients with Thoracic Aortic Disease. Circulation. 2010; 121: [id number]. 8. Moderate to severe mixed plaque of the aorta with multiple plaque  ulcerations. These results were called by telephone at the time of interpretation on 11/27/2016 at 1:29am to PA [facility], who verbally acknowledged these results. Electronically Signed   By: Mitzi Hansen M.D.   On: 11/27/2016 01:34   Ct Head Wo  Contrast  Result Date: 11/27/2016 CLINICAL DATA:  Neuro change since interventional procedure. Nonresponsive. EXAM: CT HEAD WITHOUT CONTRAST TECHNIQUE: Contiguous axial images were obtained from the base of the skull through the vertex without intravenous contrast. COMPARISON:  Head CT from earlier the same day FINDINGS: Brain: On admission head CT from yesterday there is petechial hemorrhage within the left caudate and left parietooccipital cortex correlating with subacute infarcts, see MRI 10/18/2016. There is also postprocedural high density in the left temporoparietal cortex and caudate head, likely contrast staining. There is extravasated contrast or subarachnoid hemorrhage in the lower left sylvian fissure and perimesencephalic cephalic cistern that is increased from prior. Lateral left frontal cortically based infarct that is better defined. Small focus of enhancement in the right thalamus, no calcification in this area on head CT from yesterday, presumably subacute infarct. Moderate remote right frontal infarct. Vascular: Symmetric vascular density. Skull: Remote right zygomatic arch fracture. Sinuses/Orbits: Right mastoid and middle ear opacification without erosion. Bilateral cataract resection IMPRESSION: 1. Increased subarachnoid contrast/hemorrhage in the lower left sylvian fissure and perimesencephalic cistern, overall small volume. 2. Contrast staining in the infarcted left temporoparietal cortex after recent procedure. 3. Petechial hemorrhage in subacute infarcts in the left caudate head and left parietooccipital cortex as seen on admission noncontrast head CT. 4. Moderate acute lateral left frontal infarct. Electronically Signed   By: Marnee Spring M.D.   On: 11/27/2016 07:24   Ct Head Wo Contrast  Result Date: 11/27/2016 CLINICAL DATA:  83 y/o M; stroke follow-up post intra-arterial intervention. EXAM: CT HEAD WITHOUT CONTRAST TECHNIQUE: Contiguous axial images were obtained from the base  of the skull through the vertex without intravenous contrast. COMPARISON:  11/27/2016 CT of the head and CT perfusion. 10/18/2016 MRI of the head. FINDINGS: Brain: Enhancement of the left caudate head, scattered small foci of the left frontal/ parietal convexity, and left lateral temporal lobe infarction correspond to infarct core as demonstrated by CBF<30% on prior CT perfusion of the head. No new region of infarction is identified at this time. There is sulcal effacement and mild local mass effect associated with the areas of acute infarction. No midline shift or herniation. The area of hemorrhagic infarction in the left temporoparietal lobe that was present on the 10/18/2016 MRI of the head demonstrates increased attenuation which probably represents enhancing subacute infarction. In the presence of contrast evaluation for new hemorrhage is limited. Multiple stable chronic infarctions are present in bilateral frontal lobes, left parietal lobe, and the basal ganglia. Stable chronic microvascular ischemic changes and parenchymal volume loss of the brain. Vascular: Contrast is present within the intracranial circulation. The left MCA M2 inferior division appears persistently patent. Skull: Normal. Negative for fracture or focal lesion. Sinuses/Orbits: Mild ethmoid sinus mucosal thickening. Right mastoid opacification with sclerosis compatible with chronic otomastoiditis. Soft tissue thickening of the right external auditory canal, correlate for possible otitis externa. No bony destructive changes. Bilateral intra-ocular lens replacement. Other: None. IMPRESSION: 1. Enhancement of left caudate head, scattered foci of the left frontal parietal lobes, and left lateral temporal lobe areas of acute infarction correspond to infarct core on the prior CT perfusion. No new area of acute infarction identified at this time. No significant mass effect. 2. Increased density within the  left temporoparietal lobe hemorrhagic  infarction probably represents superimposed enhancement. Evaluation for new hemorrhage is limited in the presence of contrast. 3. Soft tissue thickening of right external auditory canal, correlate for possible otitis externa. No bony destructive changes. Electronically Signed   By: Mitzi Hansen M.D.   On: 11/27/2016 05:08   Ct Head Wo Contrast  Result Date: 11/26/2016 CLINICAL DATA:  83 y/o M; inability to talk. Week with right-sided numbness. EXAM: CT HEAD WITHOUT CONTRAST TECHNIQUE: Contiguous axial images were obtained from the base of the skull through the vertex without intravenous contrast. COMPARISON:  10/21/2016 CT head and 10/18/2016 MRI of the head. FINDINGS: Brain: Hemorrhagic conversion of left parietooccipital infarction without significant mass effect or extra-axial collection. Stable small areas of now chronic infarction in right temporal periventricular white matter, left thalamus, and scattered through left frontal and parietal cortices. Stable bilateral frontal and left parietal chronic infarctions from prior study. Stable chronic microvascular ischemic changes and parenchymal volume loss of the brain. Subcentimeter focus of increased density within the left caudate head with associated hypoattenuation new from prior study compatible with a small hemorrhagic infarction (series 7, image 17). New small focus of hypoattenuation involving left lateral parietal cortex compatible with interval acute/ early subacute infarction (series 7, image 23). Vascular: Calcific atherosclerosis of carotid siphons. Left MCA hyperdensity in left lateral MCA cistern (series 7, image 11). Skull: Normal. Negative for fracture or focal lesion. Sinuses/Orbits: No acute finding. Other: None. IMPRESSION: 1. New foci of hypoattenuation within the left parietal cortex and left caudate head compatible with interval late acute/early subacute infarction from 10/21/2016. Subcentimeter focus of hemorrhage within the  new left caudate head infarction. No significant mass effect. 2. Hemorrhagic conversion of left parietooccipital infarction. No significant mass effect or extra-axial collection. 3. Multiple chronic infarcts as described are stable. 4. Hyperdense distal left MCA in the MCA cistern may represent thrombus. These results were called by telephone at the time of interpretation on 11/26/2016 at 11:50 pm to Dr. Rolland Porter , who verbally acknowledged these results. Electronically Signed   By: Mitzi Hansen M.D.   On: 11/26/2016 23:54   Ct Angio Neck W Or Wo Contrast  Result Date: 11/27/2016 CLINICAL DATA:  83 y/o  M; code stroke. EXAM: CT ANGIOGRAPHY HEAD AND NECK CT PERFUSION BRAIN TECHNIQUE: Multidetector CT imaging of the head and neck was performed using the standard protocol during bolus administration of intravenous contrast. Multiplanar CT image reconstructions and MIPs were obtained to evaluate the vascular anatomy. Carotid stenosis measurements (when applicable) are obtained utilizing NASCET criteria, using the distal internal carotid diameter as the denominator. Multiphase CT imaging of the brain was performed following IV bolus contrast injection. Subsequent parametric perfusion maps were calculated using RAPID software. CONTRAST:  90 cc Isovue 370 COMPARISON:  None. FINDINGS: CTA NECK FINDINGS Aortic arch: Ascending aortic aneurysm measuring up to 4.2 cm. Moderate to severe mixed plaque of the aortic arch with multiple plaque ulcerations. Great vessel origins are patent without high-grade stenosis. Three-vessel arch. Right carotid system: No evidence of dissection, stenosis (50% or greater) or occlusion. Moderate mixed plaque of the carotid bifurcation without significant stenosis. Left carotid system: No evidence of dissection, stenosis (50% or greater) or occlusion. Stent extending from the distal left common carotid artery to the proximal left internal carotid artery. The stent is widely  patent. Severe stenosis of left external artery origin. Vertebral arteries: Left dominant. No evidence of dissection, stenosis (50% or greater) or occlusion. Hairpin loops of V1  segments bilaterally. Skeleton: Moderate cervical spondylosis with multilevel disc and facet degenerative changes. No high-grade bony canal stenosis. Other neck: Negative. Upper chest: Peripheral reticulation of the lung parenchyma compatible fibrosis. Enlarged mediastinal lymph nodes including a left lower paratracheal lymph node measuring 12 mm short axis, probably reactive. Review of the MIP images confirms the above findings CTA HEAD FINDINGS Anterior circulation: Left M2 inferior division proximal occlusion with poor collateral circulation in the left posterior MCA distribution. Left posterior communicating artery ICA origin is rounded and measures 4 mm in diameter compatible with aneurysmal dilatation (series 10, image 106). Otherwise no significant stenosis, occlusion, or aneurysm of the anterior circulation. Posterior circulation: No significant stenosis, occlusion, or vascular malformation. Left V4 segment focal ectasia measuring 6 mm with ulcerated mural plaque (series 8, image 156). Venous sinuses: As permitted by contrast timing, patent. Anatomic variants: Left fetal PCA. Patent small right posterior communicating artery and anterior communicating artery. Delayed phase: Right mastoid opacification and sclerosis compatible sequelae of chronic otomastoiditis. Review of the MIP images confirms the above findings CT Brain Perfusion Findings: CBF (<30%) Volume: 48mL Perfusion (Tmax>6.0s) volume: Mismatch Volume: 81mL Infarction Location:Left posterior MCA IMPRESSION: 1. Left middle cerebral artery proximal M2 inferior division occlusion. Poor collateral circulation in the left posterior MCA distribution. 2. CT perfusion demonstrating left posterior MCA distribution core infarct with volume of 48 cc and ischemic penumbra of 81 cc  by automated RAPID post processing. 3. Patent carotid and vertebral arteries of the neck. No high-grade stenosis by NASCET criteria, aneurysm, or dissection. 4. Stented left distal common carotid artery to proximal left internal carotid artery. The stent appears widely patent. 5. 4 mm rounded dilation of left posterior communicating artery ICA origin compatible with aneurysm. 6. Left V4 segment focal ectasia measuring 6 mm with ulcerated mural plaque. 7. 4.2 cm ascending aortic aneurysm. Recommend annual imaging followup by CTA or MRA. This recommendation follows 2010 ACCF/AHA/AATS/ACR/ASA/SCA/SCAI/SIR/STS/SVM Guidelines for the Diagnosis and Management of Patients with Thoracic Aortic Disease. Circulation. 2010; 121: [id number]. 8. Moderate to severe mixed plaque of the aorta with multiple plaque ulcerations. These results were called by telephone at the time of interpretation on 11/27/2016 at 1:29am to PA [facility], who verbally acknowledged these results. Electronically Signed   By: Mitzi Hansen M.D.   On: 11/27/2016 01:34   Mr Brain Wo Contrast  Result Date: 11/27/2016 CLINICAL DATA:  Aphasia and right-sided weakness. History of strokes and atrial fibrillation. EXAM: MRI HEAD WITHOUT CONTRAST TECHNIQUE: Multiplanar, multiecho pulse sequences of the brain and surrounding structures were obtained without intravenous contrast. COMPARISON:  Head CT 11/27/2016 and MRI 10/18/2016 FINDINGS: Brain: There is a small to moderate-sized acute left MCA infarct involving the frontal operculum and middle frontal gyrus. There is a large posterior/inferior left MCA infarct involving much of the temporal lobe and some of the parietal lobe which is new from the prior MRI and with associated petechial hemorrhage again noted. A subacute left parieto-occipital infarct with petechial hemorrhage is also again noted. A subacute left caudate infarct is new from the prior MRI with associated petechial hemorrhage. There are  punctate acute or early subacute infarcts in the left lentiform nucleus. Subacute to chronic infarcts are present in both thalami and in the posterior limb of the right internal capsule. FLAIR hyperintensity in the left perimesencephalic cistern is consistent with small volume subarachnoid hemorrhage as seen on today's head CT. Chronic microhemorrhages are present in the right thalamus. Chronic cortical infarcts are present in both frontal lobes and  in the left parietal lobe. There is a chronic white matter infarct in the left centrum semiovale. There is moderate cerebral atrophy with unchanged asymmetric CSF over the left frontal convexity which is chronic and may be secondary to atrophy versus small subdural hygroma. No mass or midline shift. Vascular: Major intracranial vascular flow voids are preserved. Skull and upper cervical spine: Unremarkable bone marrow signal. Sinuses/Orbits: Bilateral cataract extraction. Clear paranasal sinuses. Right mastoid and middle ear fluid. Other: None. IMPRESSION: 1. Acute left lateral frontal lobe infarct. 2. Extensive subacute and chronic ischemia as above, including a large subacute left temporal lobe infarct which is new from 10/18/2016. Associated petechial hemorrhage. 3. Small volume subarachnoid hemorrhage in the left perimesencephalic cistern. Electronically Signed   By: Sebastian Ache M.D.   On: 11/27/2016 10:30   Ct Cerebral Perfusion W Contrast  Result Date: 11/27/2016 CLINICAL DATA:  83 y/o  M; code stroke. EXAM: CT ANGIOGRAPHY HEAD AND NECK CT PERFUSION BRAIN TECHNIQUE: Multidetector CT imaging of the head and neck was performed using the standard protocol during bolus administration of intravenous contrast. Multiplanar CT image reconstructions and MIPs were obtained to evaluate the vascular anatomy. Carotid stenosis measurements (when applicable) are obtained utilizing NASCET criteria, using the distal internal carotid diameter as the denominator. Multiphase CT  imaging of the brain was performed following IV bolus contrast injection. Subsequent parametric perfusion maps were calculated using RAPID software. CONTRAST:  90 cc Isovue 370 COMPARISON:  None. FINDINGS: CTA NECK FINDINGS Aortic arch: Ascending aortic aneurysm measuring up to 4.2 cm. Moderate to severe mixed plaque of the aortic arch with multiple plaque ulcerations. Great vessel origins are patent without high-grade stenosis. Three-vessel arch. Right carotid system: No evidence of dissection, stenosis (50% or greater) or occlusion. Moderate mixed plaque of the carotid bifurcation without significant stenosis. Left carotid system: No evidence of dissection, stenosis (50% or greater) or occlusion. Stent extending from the distal left common carotid artery to the proximal left internal carotid artery. The stent is widely patent. Severe stenosis of left external artery origin. Vertebral arteries: Left dominant. No evidence of dissection, stenosis (50% or greater) or occlusion. Hairpin loops of V1 segments bilaterally. Skeleton: Moderate cervical spondylosis with multilevel disc and facet degenerative changes. No high-grade bony canal stenosis. Other neck: Negative. Upper chest: Peripheral reticulation of the lung parenchyma compatible fibrosis. Enlarged mediastinal lymph nodes including a left lower paratracheal lymph node measuring 12 mm short axis, probably reactive. Review of the MIP images confirms the above findings CTA HEAD FINDINGS Anterior circulation: Left M2 inferior division proximal occlusion with poor collateral circulation in the left posterior MCA distribution. Left posterior communicating artery ICA origin is rounded and measures 4 mm in diameter compatible with aneurysmal dilatation (series 10, image 106). Otherwise no significant stenosis, occlusion, or aneurysm of the anterior circulation. Posterior circulation: No significant stenosis, occlusion, or vascular malformation. Left V4 segment focal  ectasia measuring 6 mm with ulcerated mural plaque (series 8, image 156). Venous sinuses: As permitted by contrast timing, patent. Anatomic variants: Left fetal PCA. Patent small right posterior communicating artery and anterior communicating artery. Delayed phase: Right mastoid opacification and sclerosis compatible sequelae of chronic otomastoiditis. Review of the MIP images confirms the above findings CT Brain Perfusion Findings: CBF (<30%) Volume: 48mL Perfusion (Tmax>6.0s) volume: Mismatch Volume: 81mL Infarction Location:Left posterior MCA IMPRESSION: 1. Left middle cerebral artery proximal M2 inferior division occlusion. Poor collateral circulation in the left posterior MCA distribution. 2. CT perfusion demonstrating left posterior MCA distribution core  infarct with volume of 48 cc and ischemic penumbra of 81 cc by automated RAPID post processing. 3. Patent carotid and vertebral arteries of the neck. No high-grade stenosis by NASCET criteria, aneurysm, or dissection. 4. Stented left distal common carotid artery to proximal left internal carotid artery. The stent appears widely patent. 5. 4 mm rounded dilation of left posterior communicating artery ICA origin compatible with aneurysm. 6. Left V4 segment focal ectasia measuring 6 mm with ulcerated mural plaque. 7. 4.2 cm ascending aortic aneurysm. Recommend annual imaging followup by CTA or MRA. This recommendation follows 2010 ACCF/AHA/AATS/ACR/ASA/SCA/SCAI/SIR/STS/SVM Guidelines for the Diagnosis and Management of Patients with Thoracic Aortic Disease. Circulation. 2010; 121: V784-O962. 8. Moderate to severe mixed plaque of the aorta with multiple plaque ulcerations. These results were called by telephone at the time of interpretation on 11/27/2016 at 1:29am to [facility], who verbally acknowledged these results. Electronically Signed   By: Mitzi Hansen M.D.   On: 11/27/2016 01:34   Portable Chest Xray  Result Date: 11/27/2016 CLINICAL  DATA:  Intubation . EXAM: PORTABLE CHEST 1 VIEW COMPARISON:  10/20/2016. FINDINGS: Endotracheal tube tip noted 2.7 cm above the carina. Cardiomegaly with normal pulmonary vascularity. Low lung volumes with mild mild bilateral atelectatic changes. No pleural effusion or pneumothorax . IMPRESSION: 1. Endotracheal tube tip noted 2.7 cm above the carina. 2. Cardiomegaly with normal pulmonary vascularity. 3. Low lung volumes with mild bilateral atelectatic changes . Electronically Signed   By: Maisie Fus  Register   On: 11/27/2016 09:31    Labs:  CBC:  Recent Labs  10/20/16 0422 10/21/16 1730 11/05/16 1032 11/26/16 2256 11/26/16 2307  WBC 6.6 6.0 7.6 7.4  --   HGB 9.2* 8.5* 10.0* 9.5* 9.9*  HCT 27.9* 24.9* 29.4* 28.7* 29.0*  PLT 176 192 197 196  --     COAGS:  Recent Labs  07/18/16 0344 10/18/16 0800 11/26/16 2256 11/27/16 1343  INR 1.19 1.29 2.75 1.67  APTT 45* 59* 70*  --     BMP:  Recent Labs  10/20/16 0422 10/21/16 1730 11/05/16 1032 11/26/16 2256 11/26/16 2307  NA 133* 131* 122* 131* 134*  K 3.8 3.6 4.8 4.5 3.8  CL 107 104 87* 99* 96*  CO2 20* 21* 22 24  --   GLUCOSE 95 128* 92 130* 125*  BUN 11 13 9 8 10   CALCIUM 8.6* 8.5* 8.8 9.0  --   CREATININE 1.46* 1.73* 1.34* 1.37* 1.40*  GFRNONAA 41* 33* 47* 44*  --   GFRAA 48* 39* 54* 51*  --     LIVER FUNCTION TESTS:  Recent Labs  10/08/16 1934 10/18/16 0800 11/05/16 1032 11/26/16 2256  BILITOT 1.4* 1.1 1.0 1.0  AST 21 26 26 28   ALT 13* 15* 10 16*  ALKPHOS 74 80 93 76  PROT 7.8 8.5* 7.6 7.6  ALBUMIN 3.4* 3.4* 3.8 3.3*    Assessment and Plan: 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  Per RN, able to move all extremities when not sedatied.  Some movements appear purposeful.  BP management an issue with sedation medication.  Continue to support BP.  IR to follow.  Sheath remains in place due to patient on Xarelto  and INR 2.7 this AM.  Recheck INR 1.67 this afternoon.   Electronically Signed: Hoyt Koch, PA 11/27/2016, 2:23 PM   I spent a total of 15 Minutes at the the patient's bedside AND on the patient's hospital  floor or unit, greater than 50% of which was counseling/coordinating care for MCA CVA

## 2016-11-27 NOTE — Progress Notes (Signed)
Patient to be flat until 2300. Site is clean and dry. Frequent neuro/vitals/groin site checks initiated at 1700 post sheath removal. Johnwilliam Shepperson, [place] Scrape, RN

## 2016-11-27 NOTE — Progress Notes (Signed)
Patient already has a formal critical care note today by Dr Carlota Raspberry. Have reviewed his chart and examined him briefly. Femoral sheath pulled at 5pm after INR finally improved. Hopefully can attempt SBT in AM. Wheezing on exam changed duonebs from PRN to scheduled; continue pulmicort and brovana nebs. Hx Afib now in RVR; start Dilt gtt. Full note to follow in AM.   Milana Obey, MD Pulmonary & Critical Care Medicine

## 2016-11-27 NOTE — Sedation Documentation (Signed)
Foley Catheter insertion attempted in ED and unsuccessful. Dr. Corliss Skains aware and does not want another attempt made.

## 2016-11-27 NOTE — Consult Note (Addendum)
PULMONARY / CRITICAL CARE MEDICINE   Name: [name] MRN: [id number] DOB: 01/08/1927    ADMISSION DATE:  11/26/2016 CONSULTATION DATE:  11/27/16  REFERRING MD:  Otelia Limes  CHIEF COMPLAINT:  Aphasia  HISTORY OF PRESENT ILLNESS:  Pt is encephelopathic; therefore, this HPI is obtained from chart review. [name] is a 83 y.o. male with PMH as outlined below including multiple strokes in the past, A.fib on xarelto.  He presented to [facility] ED 10/17 with acute aphasia and right sided weakness.  Had similar symptoms with acute CVA 1 month prior.  In ED, CT demonstrated dense left M2 segment.  He was taken to IR where he had left common carotid arteriogram followed by complete revascularization of occluded left MCA.  Per IR notes, had small hemorrhagic conversion in left caudate and left parietal occipital.  He returned to the ICU on the ventilator and PCCM was asked to see for vent management.  PAST MEDICAL HISTORY :  He  has a past medical history of AAA (abdominal aortic aneurysm) (HCC); Anemia; Asthma; Carotid artery disease (HCC); CHF (congestive heart failure) (HCC); Coronary artery disease; Depression; Gout; H. pylori infection; Hard of hearing; Hiatal hernia; Hyperplasia, prostate; Hypertension; PUD (peptic ulcer disease); and Renal failure, acute (HCC) (11/08/2012).  PAST SURGICAL HISTORY: He  has a past surgical history that includes Esophagogastroduodenoscopy (06/13/2008,12/31/12); Carotid stent insertion (2015); left heart catheterization with coronary angiogram (N/A, 05/04/2013); and carotid stent insertion (N/A, 05/09/2013).  Allergies  Allergen Reactions  . Levofloxacin Anaphylaxis and Other (See Comments)    Patient told his daughter that it made him "feel worse than he already did" (overall) Headache also (has refused to take anymore)  . Eliquis [Apixaban] Itching and Swelling  . Phenazopyridine Hcl Other (See Comments)    Unknown reaction  . Septra  [Sulfamethoxazole-Trimethoprim] Nausea And Vomiting and Other (See Comments)    GI Upset  . Latex Rash  . Tape Rash    Current Facility-Administered Medications on File Prior to Encounter  Medication  . ceFAZolin (ANCEF) 2-4 GM/100ML-% IVPB  . nitroGLYCERIN 100 MCG/ML intra-arterial injection   Current Outpatient Prescriptions on File Prior to Encounter  Medication Sig  . allopurinol (ZYLOPRIM) 300 MG tablet Take 300 mg by mouth daily. To prevent gout  . arformoterol (BROVANA) 15 MCG/2ML NEBU Take 2 mLs (15 mcg total) by nebulization 2 (two) times daily.  [name] aspirin EC 81 MG tablet Take 1 tablet (81 mg total) by mouth daily.  [name] atorvastatin (LIPITOR) 40 MG tablet Take 1 tablet (40 mg total) by mouth daily at 6 PM.  . budesonide (PULMICORT) 0.5 MG/2ML nebulizer solution Take 2 mLs (0.5 mg total) by nebulization 2 (two) times daily.  . carvedilol (COREG) 6.25 MG tablet Take 1 tablet (6.25 mg total) by mouth 2 (two) times daily with a meal.  . esomeprazole (NEXIUM) 40 MG capsule Take 1 capsule (40 mg total) by mouth 2 (two) times daily before a meal.  . ipratropium-albuterol (DUONEB) 0.5-2.5 (3) MG/3ML SOLN Take 3 mLs by nebulization 2 (two) times daily as needed (for shortness of breath or wheezing).  . isosorbide mononitrate (IMDUR) 30 MG 24 hr tablet Take 30 mg by mouth once daily (Patient taking differently: Take 30 mg by mouth daily. For control of heart pain)  . meclizine (ANTIVERT) 25 MG tablet Take 25 mg by mouth 3 (three) times daily as needed for dizziness.   [name] NITROSTAT 0.4 MG SL tablet PLACE 1 TABLET UNDER THE TONGUE EVERY 5 MINUTES FOR 3  DOSES AS NEEDED FOR CHEST PAIN (Patient taking differently: PLACE 0.4 MG UNDER THE TONGUE EVERY 5 MINUTES FOR 3 DOSES AS NEEDED FOR CHEST PAIN)  . tamsulosin (FLOMAX) 0.4 MG CAPS capsule Take 0.4 mg by mouth daily. To improve bladder function  . XARELTO 15 MG TABS tablet Take 15 mg by mouth daily.   . dabigatran (PRADAXA) 75 MG CAPS capsule Take 1  capsule (75 mg total) by mouth every 12 (twelve) hours. (Patient not taking: Reported on 11/26/2016)  . [DISCONTINUED] apixaban (ELIQUIS) 2.5 MG TABS tablet Take 1 tablet (2.5 mg total) by mouth 2 (two) times daily.  . [DISCONTINUED] pantoprazole (PROTONIX) 40 MG tablet Take 1 tablet (40 mg total) by mouth 2 (two) times daily.    FAMILY HISTORY:  His indicated that his mother is deceased. He indicated that his father is deceased. He indicated that the status of his neg hx is unknown.    SOCIAL HISTORY: He  reports that he quit smoking about 14 years ago. He has never used smokeless tobacco. He reports that he does not drink alcohol or use drugs.  REVIEW OF SYSTEMS:   Unable to obtain as pt is encephalopathic.  SUBJECTIVE:  On vent, unresponsive.  VITAL SIGNS: BP (!) 158/93   Pulse 98   Temp 97.8 F (36.6 C) (Oral)   Resp (!) 22   Ht 5\' 5"  (1.651 m)   Wt 68 kg (150 lb)   SpO2 100%   BMI 24.96 kg/m   HEMODYNAMICS:    VENTILATOR SETTINGS: Vent Mode: PRVC FiO2 (%):  [100 %] 100 % Set Rate:  [14 bmp] 14 bmp Vt Set:  [500 mL] 500 mL PEEP:  [5 cmH20] 5 cmH20  INTAKE / OUTPUT: No intake/output data recorded.   PHYSICAL EXAMINATION: General: Adult male, in NAD. Neuro: Sedated, not responsive. HEENT: [place]/AT. PERRL, sclerae anicteric. Cardiovascular: IRIR, no M/R/G.  Lungs: Respirations even and unlabored.  CTA bilaterally, No W/R/R. Abdomen: BS x 4, soft, NT/ND.  Musculoskeletal: No gross deformities, no edema.  Skin: Intact, warm, no rashes.  LABS:  BMET  Recent Labs Lab 11/26/16 2256 11/26/16 2307  NA 131* 134*  K 4.5 3.8  CL 99* 96*  CO2 24  --   BUN 8 10  CREATININE 1.37* 1.40*  GLUCOSE 130* 125*    Electrolytes  Recent Labs Lab 11/26/16 2256  CALCIUM 9.0    CBC  Recent Labs Lab 11/26/16 2256 11/26/16 2307  WBC 7.4  --   HGB 9.5* 9.9*  HCT 28.7* 29.0*  PLT 196  --     Coag's  Recent Labs Lab 11/26/16 2256  APTT 70*  INR 2.75     Sepsis Markers No results for input(s): LATICACIDVEN, PROCALCITON, O2SATVEN in the last 168 hours.  ABG No results for input(s): PHART, PCO2ART, PO2ART in the last 168 hours.  Liver Enzymes  Recent Labs Lab 11/26/16 2256  AST 28  ALT 16*  ALKPHOS 76  BILITOT 1.0  ALBUMIN 3.3*    Cardiac Enzymes No results for input(s): TROPONINI, PROBNP in the last 168 hours.  Glucose No results for input(s): GLUCAP in the last 168 hours.  Imaging Ct Angio Head W Or Wo Contrast  Result Date: 11/27/2016 CLINICAL DATA:  83 y/o  M; code stroke. EXAM: CT ANGIOGRAPHY HEAD AND NECK CT PERFUSION BRAIN TECHNIQUE: Multidetector CT imaging of the head and neck was performed using the standard protocol during bolus administration of intravenous contrast. Multiplanar CT image reconstructions and MIPs were obtained  to evaluate the vascular anatomy. Carotid stenosis measurements (when applicable) are obtained utilizing NASCET criteria, using the distal internal carotid diameter as the denominator. Multiphase CT imaging of the brain was performed following IV bolus contrast injection. Subsequent parametric perfusion maps were calculated using RAPID software. CONTRAST:  90 cc Isovue 370 COMPARISON:  None. FINDINGS: CTA NECK FINDINGS Aortic arch: Ascending aortic aneurysm measuring up to 4.2 cm. Moderate to severe mixed plaque of the aortic arch with multiple plaque ulcerations. Great vessel origins are patent without high-grade stenosis. Three-vessel arch. Right carotid system: No evidence of dissection, stenosis (50% or greater) or occlusion. Moderate mixed plaque of the carotid bifurcation without significant stenosis. Left carotid system: No evidence of dissection, stenosis (50% or greater) or occlusion. Stent extending from the distal left common carotid artery to the proximal left internal carotid artery. The stent is widely patent. Severe stenosis of left external artery origin. Vertebral arteries: Left  dominant. No evidence of dissection, stenosis (50% or greater) or occlusion. Hairpin loops of V1 segments bilaterally. Skeleton: Moderate cervical spondylosis with multilevel disc and facet degenerative changes. No high-grade bony canal stenosis. Other neck: Negative. Upper chest: Peripheral reticulation of the lung parenchyma compatible fibrosis. Enlarged mediastinal lymph nodes including a left lower paratracheal lymph node measuring 12 mm short axis, probably reactive. Review of the MIP images confirms the above findings CTA HEAD FINDINGS Anterior circulation: Left M2 inferior division proximal occlusion with poor collateral circulation in the left posterior MCA distribution. Left posterior communicating artery ICA origin is rounded and measures 4 mm in diameter compatible with aneurysmal dilatation (series 10, image 106). Otherwise no significant stenosis, occlusion, or aneurysm of the anterior circulation. Posterior circulation: No significant stenosis, occlusion, or vascular malformation. Left V4 segment focal ectasia measuring 6 mm with ulcerated mural plaque (series 8, image 156). Venous sinuses: As permitted by contrast timing, patent. Anatomic variants: Left fetal PCA. Patent small right posterior communicating artery and anterior communicating artery. Delayed phase: Right mastoid opacification and sclerosis compatible sequelae of chronic otomastoiditis. Review of the MIP images confirms the above findings CT Brain Perfusion Findings: CBF (<30%) Volume: 48mL Perfusion (Tmax>6.0s) volume: Mismatch Volume: 81mL Infarction Location:Left posterior MCA IMPRESSION: 1. Left middle cerebral artery proximal M2 inferior division occlusion. Poor collateral circulation in the left posterior MCA distribution. 2. CT perfusion demonstrating left posterior MCA distribution core infarct with volume of 48 cc and ischemic penumbra of 81 cc by automated RAPID post processing. 3. Patent carotid and vertebral arteries of  the neck. No high-grade stenosis by NASCET criteria, aneurysm, or dissection. 4. Stented left distal common carotid artery to proximal left internal carotid artery. The stent appears widely patent. 5. 4 mm rounded dilation of left posterior communicating artery ICA origin compatible with aneurysm. 6. Left V4 segment focal ectasia measuring 6 mm with ulcerated mural plaque. 7. 4.2 cm ascending aortic aneurysm. Recommend annual imaging followup by CTA or MRA. This recommendation follows 2010 ACCF/AHA/AATS/ACR/ASA/SCA/SCAI/SIR/STS/SVM Guidelines for the Diagnosis and Management of Patients with Thoracic Aortic Disease. Circulation. 2010; 121: N829-F621. 8. Moderate to severe mixed plaque of the aorta with multiple plaque ulcerations. These results were called by telephone at the time of interpretation on 11/27/2016 at 1:29am to PA [facility], who verbally acknowledged these results. Electronically Signed   By: Mitzi Hansen M.D.   On: 11/27/2016 01:34   Ct Head Wo Contrast  Result Date: 11/27/2016 CLINICAL DATA:  83 y/o M; stroke follow-up post intra-arterial intervention. EXAM: CT HEAD WITHOUT CONTRAST TECHNIQUE: Contiguous axial  images were obtained from the base of the skull through the vertex without intravenous contrast. COMPARISON:  11/27/2016 CT of the head and CT perfusion. 10/18/2016 MRI of the head. FINDINGS: Brain: Enhancement of the left caudate head, scattered small foci of the left frontal/ parietal convexity, and left lateral temporal lobe infarction correspond to infarct core as demonstrated by CBF<30% on prior CT perfusion of the head. No new region of infarction is identified at this time. There is sulcal effacement and mild local mass effect associated with the areas of acute infarction. No midline shift or herniation. The area of hemorrhagic infarction in the left temporoparietal lobe that was present on the 10/18/2016 MRI of the head demonstrates increased attenuation which probably  represents enhancing subacute infarction. In the presence of contrast evaluation for new hemorrhage is limited. Multiple stable chronic infarctions are present in bilateral frontal lobes, left parietal lobe, and the basal ganglia. Stable chronic microvascular ischemic changes and parenchymal volume loss of the brain. Vascular: Contrast is present within the intracranial circulation. The left MCA M2 inferior division appears persistently patent. Skull: Normal. Negative for fracture or focal lesion. Sinuses/Orbits: Mild ethmoid sinus mucosal thickening. Right mastoid opacification with sclerosis compatible with chronic otomastoiditis. Soft tissue thickening of the right external auditory canal, correlate for possible otitis externa. No bony destructive changes. Bilateral intra-ocular lens replacement. Other: None. IMPRESSION: 1. Enhancement of left caudate head, scattered foci of the left frontal parietal lobes, and left lateral temporal lobe areas of acute infarction correspond to infarct core on the prior CT perfusion. No new area of acute infarction identified at this time. No significant mass effect. 2. Increased density within the left temporoparietal lobe hemorrhagic infarction probably represents superimposed enhancement. Evaluation for new hemorrhage is limited in the presence of contrast. 3. Soft tissue thickening of right external auditory canal, correlate for possible otitis externa. No bony destructive changes. Electronically Signed   By: Mitzi Hansen M.D.   On: 11/27/2016 05:08   Ct Head Wo Contrast  Result Date: 11/26/2016 CLINICAL DATA:  83 y/o M; inability to talk. Week with right-sided numbness. EXAM: CT HEAD WITHOUT CONTRAST TECHNIQUE: Contiguous axial images were obtained from the base of the skull through the vertex without intravenous contrast. COMPARISON:  10/21/2016 CT head and 10/18/2016 MRI of the head. FINDINGS: Brain: Hemorrhagic conversion of left parietooccipital  infarction without significant mass effect or extra-axial collection. Stable small areas of now chronic infarction in right temporal periventricular white matter, left thalamus, and scattered through left frontal and parietal cortices. Stable bilateral frontal and left parietal chronic infarctions from prior study. Stable chronic microvascular ischemic changes and parenchymal volume loss of the brain. Subcentimeter focus of increased density within the left caudate head with associated hypoattenuation new from prior study compatible with a small hemorrhagic infarction (series 7, image 17). New small focus of hypoattenuation involving left lateral parietal cortex compatible with interval acute/ early subacute infarction (series 7, image 23). Vascular: Calcific atherosclerosis of carotid siphons. Left MCA hyperdensity in left lateral MCA cistern (series 7, image 11). Skull: Normal. Negative for fracture or focal lesion. Sinuses/Orbits: No acute finding. Other: None. IMPRESSION: 1. New foci of hypoattenuation within the left parietal cortex and left caudate head compatible with interval late acute/early subacute infarction from 10/21/2016. Subcentimeter focus of hemorrhage within the new left caudate head infarction. No significant mass effect. 2. Hemorrhagic conversion of left parietooccipital infarction. No significant mass effect or extra-axial collection. 3. Multiple chronic infarcts as described are stable. 4. Hyperdense distal  left MCA in the MCA cistern may represent thrombus. These results were called by telephone at the time of interpretation on 11/26/2016 at 11:50 pm to Dr. Rolland PorterMARK JAMES , who verbally acknowledged these results. Electronically Signed   By: Mitzi HansenLance  Furusawa-Stratton M.D.   On: 11/26/2016 23:54   Ct Angio Neck W Or Wo Contrast  Result Date: 11/27/2016 CLINICAL DATA:  83 y/o  M; code stroke. EXAM: CT ANGIOGRAPHY HEAD AND NECK CT PERFUSION BRAIN TECHNIQUE: Multidetector CT imaging of the head  and neck was performed using the standard protocol during bolus administration of intravenous contrast. Multiplanar CT image reconstructions and MIPs were obtained to evaluate the vascular anatomy. Carotid stenosis measurements (when applicable) are obtained utilizing NASCET criteria, using the distal internal carotid diameter as the denominator. Multiphase CT imaging of the brain was performed following IV bolus contrast injection. Subsequent parametric perfusion maps were calculated using RAPID software. CONTRAST:  90 cc Isovue 370 COMPARISON:  None. FINDINGS: CTA NECK FINDINGS Aortic arch: Ascending aortic aneurysm measuring up to 4.2 cm. Moderate to severe mixed plaque of the aortic arch with multiple plaque ulcerations. Great vessel origins are patent without high-grade stenosis. Three-vessel arch. Right carotid system: No evidence of dissection, stenosis (50% or greater) or occlusion. Moderate mixed plaque of the carotid bifurcation without significant stenosis. Left carotid system: No evidence of dissection, stenosis (50% or greater) or occlusion. Stent extending from the distal left common carotid artery to the proximal left internal carotid artery. The stent is widely patent. Severe stenosis of left external artery origin. Vertebral arteries: Left dominant. No evidence of dissection, stenosis (50% or greater) or occlusion. Hairpin loops of V1 segments bilaterally. Skeleton: Moderate cervical spondylosis with multilevel disc and facet degenerative changes. No high-grade bony canal stenosis. Other neck: Negative. Upper chest: Peripheral reticulation of the lung parenchyma compatible fibrosis. Enlarged mediastinal lymph nodes including a left lower paratracheal lymph node measuring 12 mm short axis, probably reactive. Review of the MIP images confirms the above findings CTA HEAD FINDINGS Anterior circulation: Left M2 inferior division proximal occlusion with poor collateral circulation in the left posterior  MCA distribution. Left posterior communicating artery ICA origin is rounded and measures 4 mm in diameter compatible with aneurysmal dilatation (series 10, image 106). Otherwise no significant stenosis, occlusion, or aneurysm of the anterior circulation. Posterior circulation: No significant stenosis, occlusion, or vascular malformation. Left V4 segment focal ectasia measuring 6 mm with ulcerated mural plaque (series 8, image 156). Venous sinuses: As permitted by contrast timing, patent. Anatomic variants: Left fetal PCA. Patent small right posterior communicating artery and anterior communicating artery. Delayed phase: Right mastoid opacification and sclerosis compatible sequelae of chronic otomastoiditis. Review of the MIP images confirms the above findings CT Brain Perfusion Findings: CBF (<30%) Volume: 48mL Perfusion (Tmax>6.0s) volume: 129mL Mismatch Volume: 81mL Infarction Location:Left posterior MCA IMPRESSION: 1. Left middle cerebral artery proximal M2 inferior division occlusion. Poor collateral circulation in the left posterior MCA distribution. 2. CT perfusion demonstrating left posterior MCA distribution core infarct with volume of 48 cc and ischemic penumbra of 81 cc by automated RAPID post processing. 3. Patent carotid and vertebral arteries of the neck. No high-grade stenosis by NASCET criteria, aneurysm, or dissection. 4. Stented left distal common carotid artery to proximal left internal carotid artery. The stent appears widely patent. 5. 4 mm rounded dilation of left posterior communicating artery ICA origin compatible with aneurysm. 6. Left V4 segment focal ectasia measuring 6 mm with ulcerated mural plaque. 7. 4.2 cm ascending aortic  aneurysm. Recommend annual imaging followup by CTA or MRA. This recommendation follows 2010 ACCF/AHA/AATS/ACR/ASA/SCA/SCAI/SIR/STS/SVM Guidelines for the Diagnosis and Management of Patients with Thoracic Aortic Disease. Circulation. 2010; 121: Z610-R604. 8. Moderate  to severe mixed plaque of the aorta with multiple plaque ulcerations. These results were called by telephone at the time of interpretation on 11/27/2016 at 1:29am to PA [facility], who verbally acknowledged these results. Electronically Signed   By: Mitzi Hansen M.D.   On: 11/27/2016 01:34   Ct Cerebral Perfusion W Contrast  Result Date: 11/27/2016 CLINICAL DATA:  83 y/o  M; code stroke. EXAM: CT ANGIOGRAPHY HEAD AND NECK CT PERFUSION BRAIN TECHNIQUE: Multidetector CT imaging of the head and neck was performed using the standard protocol during bolus administration of intravenous contrast. Multiplanar CT image reconstructions and MIPs were obtained to evaluate the vascular anatomy. Carotid stenosis measurements (when applicable) are obtained utilizing NASCET criteria, using the distal internal carotid diameter as the denominator. Multiphase CT imaging of the brain was performed following IV bolus contrast injection. Subsequent parametric perfusion maps were calculated using RAPID software. CONTRAST:  90 cc Isovue 370 COMPARISON:  None. FINDINGS: CTA NECK FINDINGS Aortic arch: Ascending aortic aneurysm measuring up to 4.2 cm. Moderate to severe mixed plaque of the aortic arch with multiple plaque ulcerations. Great vessel origins are patent without high-grade stenosis. Three-vessel arch. Right carotid system: No evidence of dissection, stenosis (50% or greater) or occlusion. Moderate mixed plaque of the carotid bifurcation without significant stenosis. Left carotid system: No evidence of dissection, stenosis (50% or greater) or occlusion. Stent extending from the distal left common carotid artery to the proximal left internal carotid artery. The stent is widely patent. Severe stenosis of left external artery origin. Vertebral arteries: Left dominant. No evidence of dissection, stenosis (50% or greater) or occlusion. Hairpin loops of V1 segments bilaterally. Skeleton: Moderate cervical spondylosis with  multilevel disc and facet degenerative changes. No high-grade bony canal stenosis. Other neck: Negative. Upper chest: Peripheral reticulation of the lung parenchyma compatible fibrosis. Enlarged mediastinal lymph nodes including a left lower paratracheal lymph node measuring 12 mm short axis, probably reactive. Review of the MIP images confirms the above findings CTA HEAD FINDINGS Anterior circulation: Left M2 inferior division proximal occlusion with poor collateral circulation in the left posterior MCA distribution. Left posterior communicating artery ICA origin is rounded and measures 4 mm in diameter compatible with aneurysmal dilatation (series 10, image 106). Otherwise no significant stenosis, occlusion, or aneurysm of the anterior circulation. Posterior circulation: No significant stenosis, occlusion, or vascular malformation. Left V4 segment focal ectasia measuring 6 mm with ulcerated mural plaque (series 8, image 156). Venous sinuses: As permitted by contrast timing, patent. Anatomic variants: Left fetal PCA. Patent small right posterior communicating artery and anterior communicating artery. Delayed phase: Right mastoid opacification and sclerosis compatible sequelae of chronic otomastoiditis. Review of the MIP images confirms the above findings CT Brain Perfusion Findings: CBF (<30%) Volume: 48mL Perfusion (Tmax>6.0s) volume: Mismatch Volume: 81mL Infarction Location:Left posterior MCA IMPRESSION: 1. Left middle cerebral artery proximal M2 inferior division occlusion. Poor collateral circulation in the left posterior MCA distribution. 2. CT perfusion demonstrating left posterior MCA distribution core infarct with volume of 48 cc and ischemic penumbra of 81 cc by automated RAPID post processing. 3. Patent carotid and vertebral arteries of the neck. No high-grade stenosis by NASCET criteria, aneurysm, or dissection. 4. Stented left distal common carotid artery to proximal left internal carotid artery.  The stent appears widely patent. 5. 4 mm  rounded dilation of left posterior communicating artery ICA origin compatible with aneurysm. 6. Left V4 segment focal ectasia measuring 6 mm with ulcerated mural plaque. 7. 4.2 cm ascending aortic aneurysm. Recommend annual imaging followup by CTA or MRA. This recommendation follows 2010 ACCF/AHA/AATS/ACR/ASA/SCA/SCAI/SIR/STS/SVM Guidelines for the Diagnosis and Management of Patients with Thoracic Aortic Disease. Circulation. 2010; 121: U440-H474. 8. Moderate to severe mixed plaque of the aorta with multiple plaque ulcerations. These results were called by telephone at the time of interpretation on 11/27/2016 at 1:29am to [facility], who verbally acknowledged these results. Electronically Signed   By: Mitzi Hansen M.D.   On: 11/27/2016 01:34     STUDIES:  CT cerebral perfusion 10/18 > left M2 occlusion, left posterior MCA core infarct. 4.2cm ascending aortic aneurysm.  CULTURES: None.  ANTIBIOTICS: None.  SIGNIFICANT EVENTS: 10/18 > admit, to IR for revascularization.  LINES/TUBES: ETT 10/18 >  R femoral sheath 10/18 >   DISCUSSION: 83 y.o. male with hx A.fib on xarelto and multiple strokes, admitted 10/18 with left MCA infarct.  Taken to IR for revascularization then returned to ICU on vent. Per IR notes, had small hemorrhagic conversion in left caudate and left parietal occipital.  ASSESSMENT / PLAN:  PULMONARY A: Respiratory insufficiency - s/p intubation for IR procedure. Hx asthma. P:   Full vent support. Wean as able. VAP prevention measures. SBT in AM, possible extubation in PM. Continue BD's. CXR in AM.  CARDIOVASCULAR A:  Hx a.fib (on xarelto), HTN, CAD, CHF, ascending aortic aneurysm. P:  Holding anticoagulation given report of hemorrhagic conversion.  Defer timing of resuming anticoagulation to neuro. Lopressor PRN. Needs follow up imaging in 1 year for aneurysm. Continue cardene as needed to maintain goal SBP  120 - 140. Continue preadmission atorvastatin. Hold preadmission ASA, carvedilol, xarelto, imdur.  RENAL A:   Mild hyponatremia. AoCKD. P:   NS @ 75. BMP in AM.  GASTROINTESTINAL A:   GI prophylaxis. Nutrition. P:   SUP: Pantoprazole. NPO.  HEMATOLOGIC A:   VTE Prophylaxis. P:  SCD's. CBC in AM.  INFECTIOUS A:   No indication of infection. P:   Monitor clinically.  ENDOCRINE A:   No acute issues.   P:   No interventions required.  NEUROLOGIC A:   Sedation to mechanical ventilation. Acute left MCA infarct - s/p IR revascularization.  Per IR notes, small hemorrhagic conversion post procedure. Hx multiple strokes. P:   Sedation:  Propofol gtt / Fentanyl PRN. RASS goal: 0 to -1. Daily WUA. Neuro following. Stroke workup per neuro.  Family updated: Multiple family members updated at bedside.  Interdisciplinary Family Meeting v Palliative Care Meeting:  Due by: 12/04/16.  CC time: 30 min.   Rutherford Guys, [place] [facility] [place] [facility] Pager: [phone]  or [phone] 11/27/2016, 5:21 AM   STAFF NOTE  I, Dr Newell Coral have personally reviewed patient's available data, including medical history, events of note, physical examination and test results as part of my evaluation. I have discussed with PA and other care providers such as pharmacist, RN and RRT.  In addition,  I personally evaluated patient   83 yr old male with PMHx of AAA, Asthma, CAD, CHF. Afib on Xarelto, PUD, Hiatal Hernia ,Gout, H/o CVA in June 2018 ( right M1 stenosis without occlusion) with recovery back to his baseline per documentation. Pt presents on 10.17 to [facility] with chief complaint of difficulty speaking, speech changed at 1600 with RUE  weakness and required assistance with ambulation found on this admission to have a Left MCA ischemic infarction/hypoperfusion and a dense left M2 segment consistent with occlusion. New foci of hypoattenuation within  the left parietal cortex and left caudate head compatible with interval late acute/early subacute infarction from 10/21/2016. Subcentimeter focus of hemorrhage within the new left caudate head infarction. No significant mass effect. Hemorrhagic conversion of left parietooccipital infarction. No significant mass effect or extra-axial collection. Multiple chronic infarcts as described are stable. Hyperdense distal left MCA in the MCA cistern may represent thrombus. Pt is POD 0 S/P Lt common carotid arteriogram followed by complete revascularization of occluded Lt MCA with x 1pass with 37mm x 40 mm solitaire FR retriever device achieving a TICI 3 reperfusion. Currently intubated PCCM consulted for ventilator mgmt. Initial ABG 7.269/46/432/22- Respiratory Acidosis with Metabolic Acidosis. Adjusted vent to increase minute ventilation and decrease FiO2 to 40%. Started pt on Sedation protocol with RASS goal -1 All anticoagulation including Xarelto held pt is at increased risk for hemorrhagic conversion and per IR's assessment there is already a small hemorrhagic conversion in recent ischemia in the Lt caudate head and larger area in the lt parietal occipital region. Pt will receive an MRI per Neuro's recs and continues on frequent neuro checks with PT, OT and Speech consults when appropriate.     Rest per PA whose note is outlined above and that I agree with  The patient is critically ill with multiple organ systems failure and requires high complexity decision making for assessment and support, frequent evaluation and titration of therapies, application of advanced monitoring technologies and extensive interpretation of multiple databases.  Critical Care Time devoted to patient care services described in this note is 35 Minutes. This time reflects time of care of this signee Dr Newell Coral. This critical care time does not reflect procedure time, or teaching time or supervisory time of PA student/Med Resident  etc but could involve care discussion time    Dr. Newell Coral Pulmonary Critical Care Medicine Locums  11/27/2016 7:37 AM

## 2016-11-27 NOTE — Anesthesia Preprocedure Evaluation (Signed)
Anesthesia Evaluation  Patient identified by MRN, date of birth, ID band Patient confused    Reviewed: Allergy & Precautions, NPO status , Patient's Chart, lab work & pertinent test resultsPreop documentation limited or incomplete due to emergent nature of procedure.  Airway        Dental  (+) Edentulous Upper, Edentulous Lower   Pulmonary asthma , COPD, former smoker,    Pulmonary exam normal breath sounds clear to auscultation       Cardiovascular hypertension, + CAD, + Past MI, + Peripheral Vascular Disease and +CHF  negative cardio ROS Normal cardiovascular exam+ Valvular Problems/Murmurs MR  Rhythm:Regular Rate:Tachycardia  AAA   Neuro/Psych Depression CVA    GI/Hepatic Neg liver ROS, hiatal hernia, PUD,   Endo/Other  negative endocrine ROS  Renal/GU Renal InsufficiencyRenal disease  negative genitourinary   Musculoskeletal negative musculoskeletal ROS (+)   Abdominal   Peds  Hematology  (+) anemia ,   Anesthesia Other Findings   Reproductive/Obstetrics                             Anesthesia Physical Anesthesia Plan  ASA: IV and emergent  Anesthesia Plan: General   Post-op Pain Management:    Induction: Intravenous, Cricoid pressure planned and Rapid sequence  PONV Risk Score and Plan: 2 and Ondansetron and Treatment may vary due to age or medical condition  Airway Management Planned: Oral ETT  Additional Equipment: Arterial line  Intra-op Plan:   Post-operative Plan: Post-operative intubation/ventilation  Informed Consent: I have reviewed the patients History and Physical, chart, labs and discussed the procedure including the risks, benefits and alternatives for the proposed anesthesia with the patient or authorized representative who has indicated his/her understanding and acceptance.     Plan Discussed with: CRNA  Anesthesia Plan Comments:         Anesthesia  Quick Evaluation

## 2016-11-27 NOTE — Sedation Documentation (Signed)
Pt arrival to 4N30. Groin and pulses assessed with receiving nurse. See flowsheet.

## 2016-11-28 ENCOUNTER — Inpatient Hospital Stay (HOSPITAL_COMMUNITY): Payer: Medicare Other

## 2016-11-28 ENCOUNTER — Encounter (HOSPITAL_COMMUNITY): Payer: Self-pay | Admitting: Interventional Radiology

## 2016-11-28 DIAGNOSIS — I6359 Cerebral infarction due to unspecified occlusion or stenosis of other cerebral artery: Secondary | ICD-10-CM

## 2016-11-28 LAB — BASIC METABOLIC PANEL
ANION GAP: 11 (ref 5–15)
BUN: 9 mg/dL (ref 6–20)
CHLORIDE: 104 mmol/L (ref 101–111)
CO2: 18 mmol/L — AB (ref 22–32)
Calcium: 8 mg/dL — ABNORMAL LOW (ref 8.9–10.3)
Creatinine, Ser: 1.46 mg/dL — ABNORMAL HIGH (ref 0.61–1.24)
GFR, EST AFRICAN AMERICAN: 48 mL/min — AB (ref >60)
GFR, EST NON AFRICAN AMERICAN: 41 mL/min — AB (ref >60)
GLUCOSE: 130 mg/dL — AB (ref 65–99)
POTASSIUM: 3.5 mmol/L (ref 3.5–5.1)
Sodium: 133 mmol/L — ABNORMAL LOW (ref 135–145)

## 2016-11-28 LAB — CBC WITH DIFFERENTIAL/PLATELET
Basophils Absolute: 0 10*3/uL (ref 0.0–0.1)
Basophils Relative: 0 %
Eosinophils Absolute: 0 10*3/uL (ref 0.0–0.7)
Eosinophils Relative: 0 %
HCT: 25.2 % — ABNORMAL LOW (ref 39.0–52.0)
Hemoglobin: 8.3 g/dL — ABNORMAL LOW (ref 13.0–17.0)
Lymphocytes Relative: 6 %
Lymphs Abs: 0.7 10*3/uL (ref 0.7–4.0)
MCH: 31 pg (ref 26.0–34.0)
MCHC: 32.9 g/dL (ref 30.0–36.0)
MCV: 94 fL (ref 78.0–100.0)
Monocytes Absolute: 0.7 10*3/uL (ref 0.1–1.0)
Monocytes Relative: 6 %
Neutro Abs: 10.5 10*3/uL — ABNORMAL HIGH (ref 1.7–7.7)
Neutrophils Relative %: 88 %
Platelets: 229 10*3/uL (ref 150–400)
RBC: 2.68 MIL/uL — ABNORMAL LOW (ref 4.22–5.81)
RDW: 15.3 % (ref 11.5–15.5)
WBC: 11.9 10*3/uL — ABNORMAL HIGH (ref 4.0–10.5)

## 2016-11-28 LAB — BLOOD GAS, ARTERIAL
Acid-base deficit: 6.3 mmol/L — ABNORMAL HIGH (ref 0.0–2.0)
Bicarbonate: 17.7 mmol/L — ABNORMAL LOW (ref 20.0–28.0)
Drawn by: 44166
FIO2: 40
MECHVT: 500 mL
O2 Saturation: 98.7 %
PEEP: 5 cmH2O
Patient temperature: 100.8
RATE: 20 resp/min
pCO2 arterial: 31.7 mmHg — ABNORMAL LOW (ref 32.0–48.0)
pH, Arterial: 7.373 (ref 7.350–7.450)
pO2, Arterial: 124 mmHg — ABNORMAL HIGH (ref 83.0–108.0)

## 2016-11-28 LAB — MAGNESIUM: Magnesium: 1.4 mg/dL — ABNORMAL LOW (ref 1.7–2.4)

## 2016-11-28 LAB — POCT I-STAT 3, ART BLOOD GAS (G3+)
Acid-base deficit: 9 mmol/L — ABNORMAL HIGH (ref 0.0–2.0)
Bicarbonate: 15 mmol/L — ABNORMAL LOW (ref 20.0–28.0)
O2 SAT: 99 %
PH ART: 7.377 (ref 7.350–7.450)
TCO2: 16 mmol/L — AB (ref 22–32)
pCO2 arterial: 25.4 mmHg — ABNORMAL LOW (ref 32.0–48.0)
pO2, Arterial: 117 mmHg — ABNORMAL HIGH (ref 83.0–108.0)

## 2016-11-28 LAB — HEMOGLOBIN A1C
Hgb A1c MFr Bld: 6 % — ABNORMAL HIGH (ref 4.8–5.6)
Mean Plasma Glucose: 125.5 mg/dL

## 2016-11-28 LAB — LIPID PANEL
Cholesterol: 89 mg/dL (ref 0–200)
HDL: 28 mg/dL — ABNORMAL LOW (ref >40)
LDL CALC: 41 mg/dL (ref 0–99)
TRIGLYCERIDES: 102 mg/dL (ref <150)
Total CHOL/HDL Ratio: 3.2 RATIO
VLDL: 20 mg/dL (ref 0–40)

## 2016-11-28 LAB — PHOSPHORUS: Phosphorus: 3.1 mg/dL (ref 2.5–4.6)

## 2016-11-28 MED ORDER — METHYLPREDNISOLONE SODIUM SUCC 40 MG IJ SOLR
40.0000 mg | Freq: Four times a day (QID) | INTRAMUSCULAR | Status: DC
  Administered 2016-11-28 – 2016-11-29 (×3): 40 mg via INTRAVENOUS
  Filled 2016-11-28 (×3): qty 1

## 2016-11-28 MED ORDER — VANCOMYCIN HCL IN DEXTROSE 1-5 GM/200ML-% IV SOLN
1000.0000 mg | INTRAVENOUS | Status: DC
  Administered 2016-11-28 – 2016-12-03 (×6): 1000 mg via INTRAVENOUS
  Filled 2016-11-28 (×6): qty 200

## 2016-11-28 MED ORDER — PIPERACILLIN-TAZOBACTAM IN DEX 2-0.25 GM/50ML IV SOLN
2.2500 g | Freq: Three times a day (TID) | INTRAVENOUS | Status: DC
  Administered 2016-11-28 – 2016-11-29 (×4): 2.25 g via INTRAVENOUS
  Filled 2016-11-28 (×4): qty 50

## 2016-11-28 MED ORDER — POTASSIUM CHLORIDE 10 MEQ/100ML IV SOLN
10.0000 meq | INTRAVENOUS | Status: AC
  Administered 2016-11-28 (×4): 10 meq via INTRAVENOUS
  Filled 2016-11-28 (×6): qty 100

## 2016-11-28 MED ORDER — MAGNESIUM SULFATE 2 GM/50ML IV SOLN
2.0000 g | Freq: Once | INTRAVENOUS | Status: AC
  Administered 2016-11-28: 2 g via INTRAVENOUS
  Filled 2016-11-28: qty 50

## 2016-11-28 MED ORDER — POTASSIUM CHLORIDE 20 MEQ/15ML (10%) PO SOLN: 40.0000 meq | Freq: Once | ORAL | Status: DC

## 2016-11-28 MED ORDER — ALBUTEROL SULFATE (2.5 MG/3ML) 0.083% IN NEBU
2.5000 mg | INHALATION_SOLUTION | RESPIRATORY_TRACT | Status: DC | PRN
  Administered 2016-11-28 – 2016-11-29 (×3): 2.5 mg via RESPIRATORY_TRACT
  Filled 2016-11-28 (×3): qty 3

## 2016-11-28 NOTE — Progress Notes (Signed)
Inpatient Rehabilitation  Per OT request, patient was screened by Fae Pippin for appropriateness for an Inpatient Acute Rehab consult.  At this time we will plan to follow along for PT evaluation and hopeful progress.  Call if questions.  Charlane Ferretti., CCC/SLP Admission Coordinator  [facility] Inpatient Rehabilitation  Cell [phone]

## 2016-11-28 NOTE — Progress Notes (Signed)
PULMONARY / CRITICAL CARE MEDICINE   Name: [name] MRN: [id number] DOB: 1926-05-07    ADMISSION DATE:  11/26/2016 CONSULTATION DATE:  11/27/16  REFERRING MD:  Otelia Limes  CHIEF COMPLAINT:  Aphasia  HISTORY OF PRESENT ILLNESS:  Pt is encephelopathic; therefore, this HPI is obtained from chart review. [name] is a 83 y.o. male with PMH as outlined below including multiple strokes in the past, A.fib on xarelto.  He presented to [facility] ED 10/17 with acute aphasia and right sided weakness.  Had similar symptoms with acute CVA 1 month prior.  In ED, CT demonstrated dense left M2 segment.  He was taken to IR where he had left common carotid arteriogram followed by complete revascularization of occluded left MCA.  Per IR notes, had small hemorrhagic conversion in left caudate and left parietal occipital.  He returned to the ICU on the ventilator and PCCM was asked to see for vent management.  PAST MEDICAL HISTORY :  He  has a past medical history of AAA (abdominal aortic aneurysm) (HCC); Anemia; Asthma; Carotid artery disease (HCC); CHF (congestive heart failure) (HCC); Coronary artery disease; Depression; Gout; H. pylori infection; Hard of hearing; Hiatal hernia; Hyperplasia, prostate; Hypertension; PUD (peptic ulcer disease); and Renal failure, acute (HCC) (11/08/2012).  PAST SURGICAL HISTORY: He  has a past surgical history that includes Esophagogastroduodenoscopy (06/13/2008,12/31/12); Carotid stent insertion (2015); left heart catheterization with coronary angiogram (N/A, 05/04/2013); carotid stent insertion (N/A, 05/09/2013); and IR PERCUTANEOUS ART THROMBECTOMY/INFUSION INTRACRANIAL INC DIAG ANGIO (11/27/2016).  Allergies  Allergen Reactions  . Levofloxacin Anaphylaxis and Other (See Comments)    Patient told his daughter that it made him "feel worse than he already did" (overall) Headache also (has refused to take anymore)  . Eliquis [Apixaban] Itching and Swelling  .  Phenazopyridine Hcl Other (See Comments)    Unknown reaction  . Septra [Sulfamethoxazole-Trimethoprim] Nausea And Vomiting and Other (See Comments)    GI Upset  . Latex Rash  . Tape Rash    No current facility-administered medications on file prior to encounter.    Current Outpatient Prescriptions on File Prior to Encounter  Medication Sig  . allopurinol (ZYLOPRIM) 300 MG tablet Take 300 mg by mouth daily. To prevent gout  . arformoterol (BROVANA) 15 MCG/2ML NEBU Take 2 mLs (15 mcg total) by nebulization 2 (two) times daily.  [name] aspirin EC 81 MG tablet Take 1 tablet (81 mg total) by mouth daily.  [name] atorvastatin (LIPITOR) 40 MG tablet Take 1 tablet (40 mg total) by mouth daily at 6 PM.  . budesonide (PULMICORT) 0.5 MG/2ML nebulizer solution Take 2 mLs (0.5 mg total) by nebulization 2 (two) times daily.  . carvedilol (COREG) 6.25 MG tablet Take 1 tablet (6.25 mg total) by mouth 2 (two) times daily with a meal.  . esomeprazole (NEXIUM) 40 MG capsule Take 1 capsule (40 mg total) by mouth 2 (two) times daily before a meal.  . ipratropium-albuterol (DUONEB) 0.5-2.5 (3) MG/3ML SOLN Take 3 mLs by nebulization 2 (two) times daily as needed (for shortness of breath or wheezing).  . isosorbide mononitrate (IMDUR) 30 MG 24 hr tablet Take 30 mg by mouth once daily (Patient taking differently: Take 30 mg by mouth daily. For control of heart pain)  . meclizine (ANTIVERT) 25 MG tablet Take 25 mg by mouth 3 (three) times daily as needed for dizziness.   [name] NITROSTAT 0.4 MG SL tablet PLACE 1 TABLET UNDER THE TONGUE EVERY 5 MINUTES FOR 3 DOSES AS NEEDED FOR CHEST  PAIN (Patient taking differently: PLACE 0.4 MG UNDER THE TONGUE EVERY 5 MINUTES FOR 3 DOSES AS NEEDED FOR CHEST PAIN)  . tamsulosin (FLOMAX) 0.4 MG CAPS capsule Take 0.4 mg by mouth daily. To improve bladder function  . XARELTO 15 MG TABS tablet Take 15 mg by mouth daily.   . dabigatran (PRADAXA) 75 MG CAPS capsule Take 1 capsule (75 mg total) by mouth  every 12 (twelve) hours. (Patient not taking: Reported on 11/26/2016)  . [DISCONTINUED] apixaban (ELIQUIS) 2.5 MG TABS tablet Take 1 tablet (2.5 mg total) by mouth 2 (two) times daily.  . [DISCONTINUED] pantoprazole (PROTONIX) 40 MG tablet Take 1 tablet (40 mg total) by mouth 2 (two) times daily.   FAMILY HISTORY:  His indicated that his mother is deceased. He indicated that his father is deceased. He indicated that the status of his neg hx is unknown.   SOCIAL HISTORY: He  reports that he quit smoking about 14 years ago. He has never used smokeless tobacco. He reports that he does not drink alcohol or use drugs.  REVIEW OF SYSTEMS:   Review of Systems  Unable to perform ROS: Critical illness   SUBJECTIVE:  Femoral sheath pulled out 10/18 at 5pm. Overnight patient was febrile. Patient remains intubated this AM but is working with PT and even stood at side of bed while intubated.   VITAL SIGNS: BP 122/79   Pulse 93   Temp 100.1 F (37.8 C) (Core (Comment))   Resp (!) 26   Ht 5\' 5"  (1.651 m)   Wt 63.9 kg (140 lb 14 oz)   SpO2 97%   BMI 23.44 kg/m   VENTILATOR SETTINGS: Vent Mode: PSV;CPAP FiO2 (%):  [40 %] 40 % Set Rate:  [20 bmp] 20 bmp Vt Set:  [500 mL] 500 mL PEEP:  [5 cmH20] 5 cmH20 Pressure Support:  [5 cmH20] 5 cmH20 Plateau Pressure:  [14 cmH20] 14 cmH20  INTAKE / OUTPUT: I/O last 3 completed shifts: In: 4348.4 [I.V.:3848.4; IV Piggyback:500] Out: 1240 [Urine:1190; Blood:50]  PHYSICAL EXAMINATION: General: WDWN elderly male, intubated, critically ill Neuro: RASS 0, has a language barrier but will obey command that are mimicked to him. Squeezes his hand on left and gives thumbs up. Lifts arm on left. Lifts arm on right but wont squeeze hand or give thumbs up on right. Lifting both left off bed but not wiggling toes.   HEENT: OP clear, MM moist, Orally intubated, Scant ETT secretions Cardiovascular: regular rhythm, tachycardic to 100's, no m/r/g Lungs: CTA  b/l Abdomen:  Soft NTND Musculoskeletal: no LE edema; missing digit on right hand   Skin: no rashes   LABS:  BMET  Recent Labs Lab 11/26/16 2256 11/26/16 2307 11/28/16 0442  NA 131* 134* 133*  K 4.5 3.8 3.5  CL 99* 96* 104  CO2 24  --  18*  BUN 8 10 9   CREATININE 1.37* 1.40* 1.46*  GLUCOSE 130* 125* 130*    Electrolytes  Recent Labs Lab 11/26/16 2256 11/28/16 0442  CALCIUM 9.0 8.0*  MG  --  1.4*  PHOS  --  3.1    CBC  Recent Labs Lab 11/26/16 2256 11/26/16 2307 11/28/16 0442  WBC 7.4  --  11.9*  HGB 9.5* 9.9* 8.3*  HCT 28.7* 29.0* 25.2*  PLT 196  --  229    Coag's  Recent Labs Lab 11/26/16 2256 11/27/16 1343  APTT 70*  --   INR 2.75 1.67    Sepsis Markers No results for  input(s): LATICACIDVEN, PROCALCITON, O2SATVEN in the last 168 hours.  ABG  Recent Labs Lab 11/27/16 0523 11/28/16 0324 11/28/16 1048  PHART 7.269* 7.373 7.377  PCO2ART 48.2* 31.7* 25.4*  PO2ART 432.0* 124* 117.0*    Liver Enzymes  Recent Labs Lab 11/26/16 2256  AST 28  ALT 16*  ALKPHOS 76  BILITOT 1.0  ALBUMIN 3.3*    Cardiac Enzymes No results for input(s): TROPONINI, PROBNP in the last 168 hours.  Glucose No results for input(s): GLUCAP in the last 168 hours.  Imaging Portable Chest Xray  Result Date: 11/28/2016 CLINICAL DATA:  83 year old male with history of stroke. Evaluate endotracheal tube placement. EXAM: PORTABLE CHEST 1 VIEW COMPARISON:  Chest x-ray 11/27/2016. FINDINGS: An endotracheal tube is in place with tip 3.1 cm above the carina. Lung volumes are low. Linear scarring in the left mid lung is unchanged. New area of interstitial prominence an ill-defined opacities in the right lung base may reflect developing airspace consolidation either within the right middle or lower lobe) favored to be a right lower lobe). Mild blunting of the right costophrenic sulcus, suggestive of a small right pleural effusion. No definite left pleural effusion)  however, left costophrenic sulcus is excluded from the image). Crowding of the pulmonary vasculature, without frank pulmonary edema. Heart size is mildly enlarged. The patient is rotated to the left on today's exam, resulting in distortion of the mediastinal contours and reduced diagnostic sensitivity and specificity for mediastinal pathology. Atherosclerosis in the thoracic aorta. IMPRESSION: 1. Support apparatus, as above. 2. New ill-defined opacities in the base of the right lung concerning for developing airspace consolidation, favored to be in the right lower lobe. 3. Probable small right pleural effusion. 4. Aortic atherosclerosis. Electronically Signed   By: Trudie Reed M.D.   On: 11/28/2016 07:16   STUDIES:  CT cerebral perfusion 10/18 > left M2 occlusion, left posterior MCA core infarct. 4.2cm ascending aortic aneurysm.  SIGNIFICANT EVENTS: 10/18 > admit, to IR for revascularization. 10/19: fever overnight, pancultured, started antibiotics  LINES/TUBES: ETT 10/18 >  R femoral sheath 10/18 >10/18 PM 2-PIV's   DISCUSSION: 83 y.o. male with hx A.fib on xarelto and multiple strokes, admitted 10/18 with left MCA infarct.  Taken to IR for revascularization then returned to ICU on vent. Per IR notes, had small hemorrhagic conversion in left caudate and left parietal occipital.  ASSESSMENT / PLAN:  PULMONARY 1. Acute Hypoxic respiratory failure; Aspiration Pneumonia: - antibiotics for pneumonia as detailed below - tolerated 2hrs of SBT this AM with good RSBI, good cuff leak, good ABG, has creamy secretions from supraglottic suction but scant from ETT, ok cough reflex, fair gag. Great mental status. Will attempt extubation. - CXR in AM  2. Hx Asthma: - was wheezing on 10/18 but CTA today. - continue Duonebs q6hrs, Pulmicort and Brovana BID  CARDIOVASCULAR 1. Hx a.fib (on xarelto), HTN, CAD, CHF, ascending aortic aneurysm. - Holding anticoagulation given report of hemorrhagic  conversion.  Defer timing of resuming anticoagulation to neuro. - Lopressor PRN. - Needs follow up imaging in 1 year for aneurysm. - Continue cardene as needed to maintain goal SBP 120 - 140. - Continue preadmission atorvastatin. - Hold preadmission ASA, carvedilol, xarelto, imdur.  RENAL 1. Mild hyponatremia: - appears chronic and at baseline; continue to monitor 2. CKD:  - creatinine 1.37 on admission and is 1.46 today; baseline creatinine 1.3-1.7 - continue to monitor UOP; avoid nephrotoxic agents - continue NS @ 75cc/hr; decrease once can tolerate PO intake 3.  Hypokalemia and Hypomagnesemia: - both have been repleted.   GASTROINTESTINAL No active issues   HEMATOLOGIC 1. Anemia: - Hgb 8.3, from baseline Hgb 9-10 - continue to monitor  INFECTIOUS 1. Sepsis due to Aspiration pneumonia: - patient febrile overnight and tachycardic; CXR on my review today shows new RLL infiltrate likely an aspiration pneumonia - obtain sputum culture, blood culture, UA - because he has a history of recent admissions and is MRSA positive from nares, will start Vanc and Zosyn to treat this as an HCAP; hopefully deescalate quickly based on cultures   ENDOCRINE No active issues   NEUROLOGIC 1. Acute left MCA infarct - s/p IR revascularization.  Per IR notes, small hemorrhagic conversion post procedure. Hx multiple strokes. - Stroke workup per neuro. - continue PT/OT, Speech, and BP management.  Family updated: Multiple family members updated at bedside.  45 minutes critical care time  Milana ObeyKathleen Hammonds, MD  [facility] Pager: [phone]  or [phone] 11/27/2016, 5:21 AM

## 2016-11-28 NOTE — Progress Notes (Signed)
Pharmacy Antibiotic Note  [name] is a 83 y.o. male admitted on 11/26/2016 with pneumonia.  Pharmacy has been consulted for vancomycin and Zosyn dosing. Fever not responding to APAP.  Plan: Vancomycin 1g IV q24h Zosyn 2.25g IV q8h Follow c/s, clinical progression,  de-escalation/LOT, renlal function, level PRN  Height: 5\' 5"  (165.1 cm) Weight: 140 lb 14 oz (63.9 kg) IBW/kg (Calculated) : 61.5  Temp (24hrs), Avg:99.5 F (37.5 C), Min:98 F (36.7 C), Max:100.8 F (38.2 C)   Recent Labs Lab 11/26/16 2256 11/26/16 2307 11/28/16 0442  WBC 7.4  --  11.9*  CREATININE 1.37* 1.40* 1.46*    Estimated Creatinine Clearance: 30.4 mL/min (A) (by C-G formula based on SCr of 1.46 mg/dL (H)).    Allergies  Allergen Reactions  . Levofloxacin Anaphylaxis and Other (See Comments)    Patient told his daughter that it made him "feel worse than he already did" (overall) Headache also (has refused to take anymore)  . Eliquis [Apixaban] Itching and Swelling  . Phenazopyridine Hcl Other (See Comments)    Unknown reaction  . Septra [Sulfamethoxazole-Trimethoprim] Nausea And Vomiting and Other (See Comments)    GI Upset  . Latex Rash  . Tape Rash    Antimicrobials this admission: Vancomycin 10/19 >>  Zosyn 10/19 >>   Dose adjustments this admission: n/a  Microbiology results: 10/19 BCx:  10/19 Sputum:   10/18 MRSA PCR: POS  Thank you for allowing pharmacy to be a part of this patient's care.   Christoph Copelan D. Kasandra Fehr, PharmD, BCPS Clinical Pharmacist Pager: [phone] Clinical Phone for 11/28/2016 until 3:30pm: [phone] If after 3:30pm, please call main pharmacy at [phone] 11/28/2016 11:09 AM

## 2016-11-28 NOTE — Progress Notes (Signed)
eLink Physician-Brief Progress Note Patient Name: Mckenzy Eales DOB: 10-13-1926 MRN: [id number]   Date of Service  11/28/2016  HPI/Events of Note  Temp Got ylenal Stroke, avoid temps Add cooling   eICU Interventions       Intervention Category Intermediate Interventions: Best-practice therapies (e.g. DVT, beta blocker, etc.)  Nelda Bucks. 11/28/2016, 3:24 AM

## 2016-11-28 NOTE — Evaluation (Signed)
Physical Therapy Evaluation Patient Details Name: [name] MRN: [id number] DOB: 07-14-1926 Today's Date: 11/28/2016   History of Present Illness  Pt admitted with acute aphasia and R sided weakness. 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 PMH: multiple CVAs, most recent 10/21/17, afib, DM, afib.  Clinical Impression  Patient presents with decreased independence with mobility due to R sided weakness, decreased awareness, poor activity tolerance and decreased balance.  He will benefit from skilled PT in the acute setting to progress mobility and further explore level of support and previous level of function for appropriate d/c planning. Currently recommend Rehab consult for possible CIR admission.     Follow Up Recommendations CIR;Supervision/Assistance - 24 hour    Equipment Recommendations  Other (comment) (tba)    Recommendations for Other Services Rehab consult     Precautions / Restrictions Precautions Precautions: Fall Restrictions Weight Bearing Restrictions: No      Mobility  Bed Mobility Overal bed mobility: Needs Assistance Bed Mobility: Supine to Sit;Sit to Supine     Supine to sit: +2 for physical assistance;Max assist Sit to supine: +2 for physical assistance;Total assist   General bed mobility comments: initiates supine to sit with head/trunk, requires assist for LEs to advance to hips to EOB and raise trunk  Transfers Overall transfer level: Needs assistance Equipment used: 2 person hand held assist Transfers: Sit to/from Stand Sit to Stand: Max assist;+2 physical assistance         General transfer comment: stood from EOB x 1, blocks knees on bed, assist to rise and balance, stood x 1 minute, pt with urinary incontinence upon standing  Ambulation/Gait                Stairs            Wheelchair Mobility    Modified Rankin (Stroke Patients Only) Modified Rankin (Stroke  Patients Only) Pre-Morbid Rankin Score: Moderate disability (unknown prior level of function) Modified Rankin: Severe disability     Balance Overall balance assessment: Needs assistance Sitting-balance support: Feet supported Sitting balance-Leahy Scale: Poor Sitting balance - Comments: initially with head and neck extension and need for posterior support @ mod A, over time improved, but still close s to CGA required  Postural control: Posterior lean   Standing balance-Leahy Scale: Poor                               Pertinent Vitals/Pain Pain Assessment: Faces Faces Pain Scale: No hurt    Home Living Family/patient expects to be discharged to:: Private residence Living Arrangements: Spouse/significant other Available Help at Discharge: Family;Available 24 hours/day Type of Home: House Home Access: Stairs to enter Entrance Stairs-Rails: None Entrance Stairs-Number of Steps: 3 Home Layout: One level Home Equipment: Cane - single point;Walker - standard Additional Comments: information taken from previous admittion, pt currently intubated    Prior Function Level of Independence: Needs assistance         Comments: no family available to provide information about pt's level of functioning since his recent stroke     Hand Dominance   Dominant Hand: Right    Extremity/Trunk Assessment   Upper Extremity Assessment Upper Extremity Assessment: Defer to OT evaluation RUE Deficits / Details: FF to 45 degrees as if reaching, unable to formally test due to cognition/language barrier RUE Coordination: decreased fine motor;decreased gross motor LUE Deficits / Details: FF to 45 degrees as  if reaching, unable to formally assess due to cognition/language barrier LUE Coordination: decreased fine motor;decreased gross motor    Lower Extremity Assessment Lower Extremity Assessment: RLE deficits/detail;LLE deficits/detail RLE Deficits / Details: lifting some, but not  antigravity and at rest pushed into extension LLE Deficits / Details: lifting in bed antigravity, not following commands for formal testing    Cervical / Trunk Assessment Cervical / Trunk Assessment: Other exceptions Cervical / Trunk Exceptions: weakness  Communication   Communication: Prefers language other than English;HOH  Cognition Arousal/Alertness: Lethargic Behavior During Therapy: Flat affect Overall Cognitive Status: Difficult to assess                                 General Comments: pt intubated, not [place] speaking, no family available      General Comments      Exercises     Assessment/Plan    PT Assessment Patient needs continued PT services  PT Problem List Decreased strength;Decreased activity tolerance;Decreased balance;Decreased knowledge of use of DME;Decreased mobility;Decreased safety awareness       PT Treatment Interventions DME instruction;Gait training;Therapeutic exercise;Patient/family education;Balance training;Functional mobility training;Neuromuscular re-education;Therapeutic activities    PT Goals (Current goals can be found in the Care Plan section)  Acute Rehab PT Goals Patient Stated Goal: unable to state PT Goal Formulation: Patient unable to participate in goal setting Time For Goal Achievement: 12/12/16 Potential to Achieve Goals: Good    Frequency Min 4X/week   Barriers to discharge Other (comment) unknown level of support    Co-evaluation PT/OT/SLP Co-Evaluation/Treatment: Yes Reason for Co-Treatment: Complexity of the patient's impairments (multi-system involvement);For patient/therapist safety PT goals addressed during session: Mobility/safety with mobility;Balance OT goals addressed during session: Strengthening/ROM       AM-PAC PT "6 Clicks" Daily Activity  Outcome Measure Difficulty turning over in bed (including adjusting bedclothes, sheets and blankets)?: Unable Difficulty moving from lying on back  to sitting on the side of the bed? : Unable Difficulty sitting down on and standing up from a chair with arms (e.g., wheelchair, bedside commode, etc,.)?: Unable Help needed moving to and from a bed to chair (including a wheelchair)?: A Lot Help needed walking in hospital room?: Total Help needed climbing 3-5 steps with a railing? : Total 6 Click Score: 7    End of Session Equipment Utilized During Treatment: Other (comment) (on vent) Activity Tolerance: Patient limited by fatigue Patient left: in bed;with call bell/phone within reach;with restraints reapplied;with SCD's reapplied Nurse Communication: Mobility status PT Visit Diagnosis: Other abnormalities of gait and mobility (R26.89);Other symptoms and signs involving the nervous system (R29.898);Hemiplegia and hemiparesis Hemiplegia - Right/Left: Right Hemiplegia - dominant/non-dominant: Dominant Hemiplegia - caused by: Cerebral infarction;Other Nontraumatic intracranial hemorrhage    Time: [id number] PT Time Calculation (min) (ACUTE ONLY): 26 min   Charges:   PT Evaluation $PT Eval High Complexity: 1 High     PT G CodesSheran Lawless:        Cyndi Dashonna Chagnon, [place]PT [phone] 11/28/2016   Elray Mcgregorynthia Mancel Lardizabal 11/28/2016, 1:48 PM

## 2016-11-28 NOTE — Progress Notes (Signed)
STROKE TEAM PROGRESS NOTE   HISTORY OF PRESENT ILLNESS (per record) [name] is an 83 y.o. male with a past history of multiple strokes presenting with acute onset of aphasia and right sided weakness. He has an extensive stroke history. He has atrial fibrillation and is on Xarelto for this. Today at 2000 family noticed that the patient had difficulty speaking. It was hard for the family to understand what he was saying. He also exhibited new right sided weakness. LKN was 1600. His most recent stroke prior to today's presentation was last month, with right sided deficits and slurred speech.   Patient was not administered IV t-PA secondary to anticoagulation. Underwent endovascular clot retraction procedure He was admitted to the neuro ICU for further evaluation and treatment.   SUBJECTIVE (INTERVAL HISTORY) He is intubated but awake. He was standing at the side of the bed earlier and moving all extremities except RLE spontaneously at this time. Overnight he developed low grade fever refractory to acetaminophen and antibiotics were started.   OBJECTIVE Temp:  [98 F (36.7 C)-100.8 F (38.2 C)] 98 F (36.7 C) (10/19 0800) Pulse Rate:  [70-133] 120 (10/19 1015) Cardiac Rhythm: Atrial fibrillation (10/19 0800) Resp:  [13-26] 24 (10/19 1015) BP: (84-156)/(53-96) 141/69 (10/19 1015) SpO2:  [100 %] 100 % (10/19 1015) FiO2 (%):  [40 %] 40 % (10/19 1000)  CBC:   Recent Labs Lab 11/26/16 2256 11/26/16 2307 11/28/16 0442  WBC 7.4  --  11.9*  NEUTROABS 5.5  --  10.5*  HGB 9.5* 9.9* 8.3*  HCT 28.7* 29.0* 25.2*  MCV 92.3  --  94.0  PLT 196  --  229    Basic Metabolic Panel:   Recent Labs Lab 11/26/16 2256 11/26/16 2307 11/28/16 0442  NA 131* 134* 133*  K 4.5 3.8 3.5  CL 99* 96* 104  CO2 24  --  18*  GLUCOSE 130* 125* 130*  BUN 8 10 9   CREATININE 1.37* 1.40* 1.46*  CALCIUM 9.0  --  8.0*  MG  --   --  1.4*  PHOS  --   --  3.1    Lipid Panel:     Component Value  Date/Time   CHOL 89 11/28/2016 0442   CHOL 122 09/09/2013 0815   TRIG 102 11/28/2016 0442   HDL 28 (L) 11/28/2016 0442   HDL 35 (L) 09/09/2013 0815   CHOLHDL 3.2 11/28/2016 0442   VLDL 20 11/28/2016 0442   LDLCALC 41 11/28/2016 0442   LDLCALC 50 09/09/2013 0815   HgbA1c:  Lab Results  Component Value Date   HGBA1C 6.0 (H) 11/28/2016   Urine Drug Screen:     Component Value Date/Time   LABOPIA NONE DETECTED 10/08/2016 1937   COCAINSCRNUR NONE DETECTED 10/08/2016 1937   LABBENZ NONE DETECTED 10/08/2016 1937   AMPHETMU NONE DETECTED 10/08/2016 1937   THCU NONE DETECTED 10/08/2016 1937   LABBARB NONE DETECTED 10/08/2016 1937    Alcohol Level     Component Value Date/Time   ETH <5 10/09/2016 0100    IMAGING  Ct Angio Head W Or Wo Contrast Ct Angio Neck W Or Wo Contrast Ct Cerebral Perfusion W Contrast 11/27/2016 1. Left middle cerebral artery proximal M2 inferior division occlusion. Poor collateral circulation in the left posterior MCA distribution. 2. CT perfusion demonstrating left posterior MCA distribution core infarct with volume of 48 cc and ischemic penumbra of 81 cc by automated RAPID post processing. 3. Patent carotid and vertebral arteries of the neck. No  high-grade stenosis by NASCET criteria, aneurysm, or dissection. 4. Stented left distal common carotid artery to proximal left internal carotid artery. The stent appears widely patent. 5. 4 mm rounded dilation of left posterior communicating artery ICA origin compatible with aneurysm. 6. Left V4 segment focal ectasia measuring 6 mm with ulcerated mural plaque. 7. 4.2 cm ascending aortic aneurysm. Recommend annual imaging followup by CTA or MRA. This recommendation follows 2010 ACCF/AHA/AATS/ACR/ASA/SCA/SCAI/SIR/STS/SVM Guidelines for the Diagnosis and Management of Patients with Thoracic Aortic Disease. Circulation. 2010; 121: R604-V409. 8. Moderate to severe mixed plaque of the aorta with multiple plaque  ulcerations.  Ct Head Wo Contrast 11/26/2016 1. New foci of hypoattenuation within the left parietal cortex and left caudate head compatible with interval late acute/early subacute infarction from 10/21/2016. Subcentimeter focus of hemorrhage within the new left caudate head infarction. No significant mass effect. 2. Hemorrhagic conversion of left parietooccipital infarction. No significant mass effect or extra-axial collection. 3. Multiple chronic infarcts as described are stable. 4. Hyperdense distal left MCA in the MCA cistern may represent thrombus. 11/27/2016 1. Enhancement of left caudate head, scattered foci of the left frontal parietal lobes, and left lateral temporal lobe areas of acute infarction correspond to infarct core on the prior CT perfusion. No new area of acute infarction identified at this time. No significant mass effect. 2. Increased density within the left temporoparietal lobe hemorrhagic infarction probably represents superimposed enhancement. Evaluation for new hemorrhage is limited in the presence of contrast. 3. Soft tissue thickening of right external auditory canal, correlate for possible otitis externa. No bony destructive changes. 11/27/2016 1. Increased subarachnoid contrast/hemorrhage in the lower left sylvian fissure and perimesencephalic cistern, overall small volume. 2. Contrast staining in the infarcted left temporoparietal cortex after recent procedure. 3. Petechial hemorrhage in subacute infarcts in the left caudate head and left parietooccipital cortex as seen on admission noncontrast head CT. 4. Moderate acute lateral left frontal infarct.  Mr Brain Wo Contrast 11/27/2016 1. Acute left lateral frontal lobe infarct. 2. Extensive subacute and chronic ischemia as above, including a large subacute left temporal lobe infarct which is new from 10/18/2016. Associated petechial hemorrhage. 3. Small volume subarachnoid hemorrhage in the left perimesencephalic  cistern.  Portable Chest Xray 11/27/2016 1. Endotracheal tube tip noted 2.7 cm above the carina. 2. Cardiomegaly with normal pulmonary vascularity. 3. Low lung volumes with mild bilateral atelectatic changes . 11/28/2016 1. Support apparatus, as above. 2. New ill-defined opacities in the base of the right lung concerning for developing airspace consolidation, favored to be in the right lower lobe. 3. Probable small right pleural effusion. 4. Aortic atherosclerosis.   PHYSICAL EXAM  Temp:  [98 F (36.7 C)-100.8 F (38.2 C)] 98 F (36.7 C) (10/19 0800) Pulse Rate:  [70-133] 120 (10/19 1015) Resp:  [13-26] 24 (10/19 1015) BP: (84-156)/(53-96) 141/69 (10/19 1015) SpO2:  [100 %] 100 % (10/19 1015) FiO2 (%):  [40 %] 40 % (10/19 1000)  General - Intubated, awake spontaneously and moving around, following pantomime but not verbal commands likely language barrier with phone interpreter  Cardiovascular - Irregular rhythm with normal rate.  Mental Status -  Assessment limited, level of consciousness awake and moving  Cranial Nerves II - XII - II - Visual field intact to threats b/l. III, IV, VI - Extraocular movements intact. VII - Facial movement intact bilaterally. VIII - Hearing grossly intact XII - Tongue protrusion intact.  Motor Strength - Strength was intact in left side. RLE at least 3/5 strength. RUE withdraws but  not moving to pantomime or command.  Motor Tone - Muscle tone was normal  Reflexes - The patient's reflexes were 1+ with negative babinski.  Sensory - Not assessed  Coordination - No appreciable dysmetria or tremor but limited exam.  Gait and Station - Not assessed   ASSESSMENT/PLAN Mr. Gloria Delamater is a 83 y.o. male with history of multiple strokes, atrial fibrillation, hypertension, CAD, CHF presenting with aphasia and right sided weakness. He did not receive IV t-PA due to anticoagulation but underwent endovascular clot retraction.   Stroke:   left lateral frontal infarct embolic secondary to proximal M2  Occlusion s/p thrombectomy with revascularization  Resultant  Some degree of R weakness, awaiting extubation and evaluation  CT head New foci of hypoattenuation within the left parietal cortex and left caudate head compatible with interval late acute/early subacute infarction from 10/21/2016.  MRI head Acute left lateral frontal lobe infarct. Associated petechial hemorrhage.  CTA head Left middle cerebral artery proximal M2 inferior division occlusion.  CTA Neck Stented left distal common carotid artery to proximal left internal carotid artery  Carotid Doppler  CTA Neck  2D Echo  From 10/19/2016 LVEF 50-55%, no embolic source  LDL 41  HgbA1c 6.0%  SCDs for VTE prophylaxis Diet NPO time specified  Xarelto (rivaroxaban) daily prior to admission, now on No antithrombotic  Ongoing aggressive stroke risk factor management  Therapy recommendations:  Pending  Disposition:  Pending  Hypertension  Stable  BP goal to <180 SBP  Long-term BP goal normotensive <130/90  Hyperlipidemia  Home meds:  Atorvastatin 40mg , resumed in hospital  LDL 41, goal < 70  Continue statin at discharge  Diabetes  HgbA1c 6.0%, goal < 7.0  Other Stroke Risk Factors  Advanced age   Hx stroke/TIA  Coronary artery disease   Small secondary petechial hemorrhage in region of infarct so we are holding home anticoagulation at this time. Groin sheath out since last night. He is moving well spontaneously and could be extubated depending on SBT today. A more complete exam for fine deficits will be possible after extubation. CXR suggests possible RLL infiltrate. He does have new low fever and agree with cultures and antibiotics recommended per PCCM. Diltiazem infusion for rate control of afib. He was on neo but on no pressors at this time. We can discontinue cardene with relaxed goal SBP < 180 now in 24-48hrs days post procedure.  Hospital  day # 1  Fuller Plan, MD PGY-III Internal Medicine Resident Pager# [phone] 11/28/2016, 10:32 AM    To contact Stroke Continuity provider, please refer to [email or web address]. After hours, contact General Neurology

## 2016-11-28 NOTE — Progress Notes (Signed)
CPT not done patient was sleeping. Will continue to monitor PT throughout the night.

## 2016-11-28 NOTE — Progress Notes (Signed)
Pt extubated around 70 today and had some mild labored breathing post extubation.  Pt is still working some.  May be patients baseline with history of COPD.  Notified Dr. Jimmey Ralph to come take a look at patient.  Sats remain in upper 90's.  RR around the same rate it has been.  HRR remains Afib RVR in same range it has been since prior to extubation.  Just working a bit to breathe.  RT aware.

## 2016-11-28 NOTE — Evaluation (Signed)
Occupational Therapy Evaluation Patient Details Name: [name] MRN: [id number] DOB: 06-Oct-1926 Today's Date: 11/28/2016    History of Present Illness Pt admitted with acute aphasia and R sided weakness. 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 PMH: multiple CVAs, most recent 10/21/17, afib, DM, afib.   Clinical Impression   No family available to offer information about pt's PLOF since his CVA last month. Prior to that, however, he walked with a walker and was assisted for bathing and dressing per chart review. Pt was evaluated while intubated. Somewhat lethargic and presenting with generalized weakness. No family available to interpret, unclear what is pt's native language. He currently requires +2 max to total assist for mobility and total assist for ADL. Will follow acutely.    Follow Up Recommendations  CIR;Supervision/Assistance - 24 hour    Equipment Recommendations   (TBD)    Recommendations for Other Services Rehab consult     Precautions / Restrictions Precautions Precautions: Fall Restrictions Weight Bearing Restrictions: No      Mobility Bed Mobility Overal bed mobility: Needs Assistance Bed Mobility: Supine to Sit;Sit to Supine     Supine to sit: +2 for physical assistance;Max assist Sit to supine: +2 for physical assistance;Total assist   General bed mobility comments: initiates supine to sit with head/trunk, requires assist for LEs to advance to hips to EOB and raise trunk  Transfers Overall transfer level: Needs assistance Equipment used: 2 person hand held assist Transfers: Sit to/from Stand Sit to Stand: Max assist;+2 physical assistance         General transfer comment: stood from EOB x 1, blocks knees on bed, assist to rise and balance, stood x 1 minute, pt with urinary incontinence upon standing    Balance Overall balance assessment: Needs assistance Sitting-balance support: Feet  supported Sitting balance-Leahy Scale: Poor       Standing balance-Leahy Scale: Poor                             ADL either performed or assessed with clinical judgement   ADL Overall ADL's : Needs assistance/impaired                                       General ADL Comments: currently requiring total assist     Vision   Additional Comments: needs further assesssment     Perception     Praxis      Pertinent Vitals/Pain Pain Assessment: Faces Faces Pain Scale: No hurt     Hand Dominance Right   Extremity/Trunk Assessment Upper Extremity Assessment Upper Extremity Assessment: RUE deficits/detail;LUE deficits/detail RUE Deficits / Details: FF to 45 degrees as if reaching, unable to formally test due to cognition/language barrier RUE Coordination: decreased fine motor;decreased gross motor LUE Deficits / Details: FF to 45 degrees as if reaching, unable to formally assess due to cognition/language barrier LUE Coordination: decreased fine motor;decreased gross motor       Cervical / Trunk Assessment Cervical / Trunk Assessment: Other exceptions Cervical / Trunk Exceptions: weakness   Communication Communication Communication: Prefers language other than English;HOH   Cognition Arousal/Alertness: Lethargic Behavior During Therapy: Flat affect Overall Cognitive Status: Difficult to assess  General Comments: pt intubated, not [place] speaking, no family available   General Comments       Exercises     Shoulder Instructions      Home Living Family/patient expects to be discharged to:: Private residence Living Arrangements: Spouse/significant other Available Help at Discharge: Family;Available 24 hours/day Type of Home: House Home Access: Stairs to enter Entergy CorporationEntrance Stairs-Number of Steps: 3 Entrance Stairs-Rails: None Home Layout: One level     Bathroom Shower/Tub: Tub only    FirefighterBathroom Toilet: Standard     Home Equipment: Cane - single point;Walker - standard   Additional Comments: information taken from previous admittion, pt currently intubated      Prior Functioning/Environment Level of Independence: Needs assistance        Comments: no family available to provide information about pt's level of functioning since his recent stroke        OT Problem List: Decreased strength;Decreased activity tolerance;Impaired balance (sitting and/or standing);Decreased coordination;Decreased knowledge of use of DME or AE;Decreased cognition;Impaired UE functional use      OT Treatment/Interventions: Self-care/ADL training;DME and/or AE instruction;Therapeutic activities;Patient/family education;Neuromuscular education;Cognitive remediation/compensation    OT Goals(Current goals can be found in the care plan section) Acute Rehab OT Goals Patient Stated Goal: unable to state OT Goal Formulation: Patient unable to participate in goal setting Time For Goal Achievement: 12/12/16 Potential to Achieve Goals: Good ADL Goals Pt Will Perform Eating: (P) with min assist;sitting (when appropriate) Pt Will Perform Grooming: (P) with min assist;sitting Pt Will Perform Upper Body Dressing: (P) with min assist;sitting Pt Will Transfer to Toilet: (P) with min assist;stand pivot transfer;bedside commode Pt/caregiver will Perform Home Exercise Program: (P) Increased strength;Both right and left upper extremity;With minimal assist Additional ADL Goal #1: (P) Pt will sit EOB with supervision in preparation for ADL.  OT Frequency: Min 3X/week   Barriers to D/C:            Co-evaluation PT/OT/SLP Co-Evaluation/Treatment: Yes Reason for Co-Treatment: For patient/therapist safety   OT goals addressed during session: Strengthening/ROM      AM-PAC PT "6 Clicks" Daily Activity     Outcome Measure Help from another person eating meals?: Total Help from another person taking  care of personal grooming?: Total Help from another person toileting, which includes using toliet, bedpan, or urinal?: Total Help from another person bathing (including washing, rinsing, drying)?: Total Help from another person to put on and taking off regular upper body clothing?: Total Help from another person to put on and taking off regular lower body clothing?: Total 6 Click Score: 6   End of Session Equipment Utilized During Treatment: Other (comment) (pt intubated) Nurse Communication: Other (comment) (aware of urinary incontinence, condom cath off)  Activity Tolerance: Patient tolerated treatment well (VSS on vent) Patient left: in bed;with call bell/phone within reach  OT Visit Diagnosis: Unsteadiness on feet (R26.81);Muscle weakness (generalized) (M62.81)                Time: [id number] OT Time Calculation (min): 26 min Charges:  OT General Charges $OT Visit: 1 Visit OT Evaluation $OT Eval High Complexity: 1 High G-Codes:     11/28/2016 Martie RoundJulie Daniel Johndrow, OTR/L Pager: [phone]  Iran PlanasMayberry, [place] BailiffJulie Lynn 11/28/2016, 1:02 PM

## 2016-11-28 NOTE — Progress Notes (Signed)
Patient ID: [name], male   DOB: [birth year], 83 y.o.   MRN: [id number]    Referring Physician(s): Dr. Delia Heady  Supervising Physician: Julieanne Cotton  Patient Status: [facility] - In-pt  Chief Complaint: CVA  Subjective: Pt just extubated.  Doesn't speak [place].  RN states he has some expressive aphasia.  Does follow some commands.    Allergies: Levofloxacin; Eliquis [apixaban]; Phenazopyridine hcl; Septra [sulfamethoxazole-trimethoprim]; Latex; and Tape  Medications: Prior to Admission medications   Medication Sig Start Date End Date Taking? Authorizing Provider  allopurinol (ZYLOPRIM) 300 MG tablet Take 300 mg by mouth daily. To prevent gout   Yes [provider]  arformoterol (BROVANA) 15 MCG/2ML NEBU Take 2 mLs (15 mcg total) by nebulization 2 (two) times daily. 10/10/15  Yes Rhetta Mura, MD  arformoterol (BROVANA) 15 MCG/2ML NEBU Inhale 2 mLs into the lungs 2 (two) times daily. 11/11/16  Yes [provider]  aspirin EC 81 MG tablet Take 1 tablet (81 mg total) by mouth daily. 09/08/13  Yes Marvel Plan, MD  atorvastatin (LIPITOR) 40 MG tablet Take 1 tablet (40 mg total) by mouth daily at 6 PM. 05/11/13  Yes Sharol Harness, Brittainy M, PA-C  budesonide (PULMICORT) 0.5 MG/2ML nebulizer solution Take 2 mLs (0.5 mg total) by nebulization 2 (two) times daily. 10/10/15  Yes Rhetta Mura, MD  carvedilol (COREG) 6.25 MG tablet Take 1 tablet (6.25 mg total) by mouth 2 (two) times daily with a meal. 07/23/16  Yes Runell Gess, MD  esomeprazole (NEXIUM) 40 MG capsule Take 1 capsule (40 mg total) by mouth 2 (two) times daily before a meal. 07/21/16  Yes Johnson, Clanford L, MD  HYDROcodone-homatropine (HYCODAN) 5-1.5 MG/5ML syrup Take 5 mLs by mouth every 6 (six) hours as needed for cough.   Yes [provider]  ipratropium-albuterol (DUONEB) 0.5-2.5 (3) MG/3ML SOLN Take 3 mLs by nebulization 2 (two) times daily as needed (for shortness of breath or  wheezing).   Yes [provider]  isosorbide mononitrate (IMDUR) 30 MG 24 hr tablet Take 30 mg by mouth once daily Patient taking differently: Take 30 mg by mouth daily. For control of heart pain 07/31/15  Yes Margarita Grizzle, MD  meclizine (ANTIVERT) 25 MG tablet Take 25 mg by mouth 3 (three) times daily as needed for dizziness.  09/05/16  Yes [provider]  NITROSTAT 0.4 MG SL tablet PLACE 1 TABLET UNDER THE TONGUE EVERY 5 MINUTES FOR 3 DOSES AS NEEDED FOR CHEST PAIN Patient taking differently: PLACE 0.4 MG UNDER THE TONGUE EVERY 5 MINUTES FOR 3 DOSES AS NEEDED FOR CHEST PAIN 10/29/15  Yes Runell Gess, MD  nystatin (MYCOSTATIN) 100000 UNIT/ML suspension Take 3 mLs by mouth 4 (four) times daily. 11/10/16  Yes [provider]  tamsulosin (FLOMAX) 0.4 MG CAPS capsule Take 0.4 mg by mouth daily. To improve bladder function   Yes [provider]  XARELTO 15 MG TABS tablet Take 15 mg by mouth daily.  10/24/16  Yes [provider]  dabigatran (PRADAXA) 75 MG CAPS capsule Take 1 capsule (75 mg total) by mouth every 12 (twelve) hours. Patient not taking: Reported on 11/26/2016 10/21/16 11/20/16  Edsel Petrin, DO    Vital Signs: BP 128/63   Pulse 93   Temp 100.1 F (37.8 C) (Core (Comment))   Resp (!) 27   Ht 5\' 5"  (1.651 m)   Wt 140 lb 14 oz (63.9 kg)   SpO2 97%   BMI 23.44 kg/m   Physical  Exam: Neuro: minimal right UE drift when holding arms up.  Equal BLE strength when pulling toes cephalad.   Skin: R CFA stick site is c/d/i  Imaging: Ct Angio Head W Or Wo Contrast  Result Date: 11/27/2016 CLINICAL DATA:  83 y/o  M; code stroke. EXAM: CT ANGIOGRAPHY HEAD AND NECK CT PERFUSION BRAIN TECHNIQUE: Multidetector CT imaging of the head and neck was performed using the standard protocol during bolus administration of intravenous contrast. Multiplanar CT image reconstructions and MIPs were obtained to evaluate the vascular anatomy. Carotid stenosis  measurements (when applicable) are obtained utilizing NASCET criteria, using the distal internal carotid diameter as the denominator. Multiphase CT imaging of the brain was performed following IV bolus contrast injection. Subsequent parametric perfusion maps were calculated using RAPID software. CONTRAST:  90 cc Isovue 370 COMPARISON:  None. FINDINGS: CTA NECK FINDINGS Aortic arch: Ascending aortic aneurysm measuring up to 4.2 cm. Moderate to severe mixed plaque of the aortic arch with multiple plaque ulcerations. Great vessel origins are patent without high-grade stenosis. Three-vessel arch. Right carotid system: No evidence of dissection, stenosis (50% or greater) or occlusion. Moderate mixed plaque of the carotid bifurcation without significant stenosis. Left carotid system: No evidence of dissection, stenosis (50% or greater) or occlusion. Stent extending from the distal left common carotid artery to the proximal left internal carotid artery. The stent is widely patent. Severe stenosis of left external artery origin. Vertebral arteries: Left dominant. No evidence of dissection, stenosis (50% or greater) or occlusion. Hairpin loops of V1 segments bilaterally. Skeleton: Moderate cervical spondylosis with multilevel disc and facet degenerative changes. No high-grade bony canal stenosis. Other neck: Negative. Upper chest: Peripheral reticulation of the lung parenchyma compatible fibrosis. Enlarged mediastinal lymph nodes including a left lower paratracheal lymph node measuring 12 mm short axis, probably reactive. Review of the MIP images confirms the above findings CTA HEAD FINDINGS Anterior circulation: Left M2 inferior division proximal occlusion with poor collateral circulation in the left posterior MCA distribution. Left posterior communicating artery ICA origin is rounded and measures 4 mm in diameter compatible with aneurysmal dilatation (series 10, image 106). Otherwise no significant stenosis, occlusion, or  aneurysm of the anterior circulation. Posterior circulation: No significant stenosis, occlusion, or vascular malformation. Left V4 segment focal ectasia measuring 6 mm with ulcerated mural plaque (series 8, image 156). Venous sinuses: As permitted by contrast timing, patent. Anatomic variants: Left fetal PCA. Patent small right posterior communicating artery and anterior communicating artery. Delayed phase: Right mastoid opacification and sclerosis compatible sequelae of chronic otomastoiditis. Review of the MIP images confirms the above findings CT Brain Perfusion Findings: CBF (<30%) Volume: 48mL Perfusion (Tmax>6.0s) volume: 129mL Mismatch Volume: 81mL Infarction Location:Left posterior MCA IMPRESSION: 1. Left middle cerebral artery proximal M2 inferior division occlusion. Poor collateral circulation in the left posterior MCA distribution. 2. CT perfusion demonstrating left posterior MCA distribution core infarct with volume of 48 cc and ischemic penumbra of 81 cc by automated RAPID post processing. 3. Patent carotid and vertebral arteries of the neck. No high-grade stenosis by NASCET criteria, aneurysm, or dissection. 4. Stented left distal common carotid artery to proximal left internal carotid artery. The stent appears widely patent. 5. 4 mm rounded dilation of left posterior communicating artery ICA origin compatible with aneurysm. 6. Left V4 segment focal ectasia measuring 6 mm with ulcerated mural plaque. 7. 4.2 cm ascending aortic aneurysm. Recommend annual imaging followup by CTA or MRA. This recommendation follows 2010 ACCF/AHA/AATS/ACR/ASA/SCA/SCAI/SIR/STS/SVM Guidelines for the Diagnosis and Management of Patients  with Thoracic Aortic Disease. Circulation. 2010; 121: Z610-R604. 8. Moderate to severe mixed plaque of the aorta with multiple plaque ulcerations. These results were called by telephone at the time of interpretation on 11/27/2016 at 1:29am to PA [facility], who verbally acknowledged these results.  Electronically Signed   By: Mitzi Hansen M.D.   On: 11/27/2016 01:34   Ct Head Wo Contrast  Result Date: 11/27/2016 CLINICAL DATA:  Neuro change since interventional procedure. Nonresponsive. EXAM: CT HEAD WITHOUT CONTRAST TECHNIQUE: Contiguous axial images were obtained from the base of the skull through the vertex without intravenous contrast. COMPARISON:  Head CT from earlier the same day FINDINGS: Brain: On admission head CT from yesterday there is petechial hemorrhage within the left caudate and left parietooccipital cortex correlating with subacute infarcts, see MRI 10/18/2016. There is also postprocedural high density in the left temporoparietal cortex and caudate head, likely contrast staining. There is extravasated contrast or subarachnoid hemorrhage in the lower left sylvian fissure and perimesencephalic cephalic cistern that is increased from prior. Lateral left frontal cortically based infarct that is better defined. Small focus of enhancement in the right thalamus, no calcification in this area on head CT from yesterday, presumably subacute infarct. Moderate remote right frontal infarct. Vascular: Symmetric vascular density. Skull: Remote right zygomatic arch fracture. Sinuses/Orbits: Right mastoid and middle ear opacification without erosion. Bilateral cataract resection IMPRESSION: 1. Increased subarachnoid contrast/hemorrhage in the lower left sylvian fissure and perimesencephalic cistern, overall small volume. 2. Contrast staining in the infarcted left temporoparietal cortex after recent procedure. 3. Petechial hemorrhage in subacute infarcts in the left caudate head and left parietooccipital cortex as seen on admission noncontrast head CT. 4. Moderate acute lateral left frontal infarct. Electronically Signed   By: Marnee Spring M.D.   On: 11/27/2016 07:24   Ct Head Wo Contrast  Result Date: 11/27/2016 CLINICAL DATA:  83 y/o M; stroke follow-up post intra-arterial  intervention. EXAM: CT HEAD WITHOUT CONTRAST TECHNIQUE: Contiguous axial images were obtained from the base of the skull through the vertex without intravenous contrast. COMPARISON:  11/27/2016 CT of the head and CT perfusion. 10/18/2016 MRI of the head. FINDINGS: Brain: Enhancement of the left caudate head, scattered small foci of the left frontal/ parietal convexity, and left lateral temporal lobe infarction correspond to infarct core as demonstrated by CBF<30% on prior CT perfusion of the head. No new region of infarction is identified at this time. There is sulcal effacement and mild local mass effect associated with the areas of acute infarction. No midline shift or herniation. The area of hemorrhagic infarction in the left temporoparietal lobe that was present on the 10/18/2016 MRI of the head demonstrates increased attenuation which probably represents enhancing subacute infarction. In the presence of contrast evaluation for new hemorrhage is limited. Multiple stable chronic infarctions are present in bilateral frontal lobes, left parietal lobe, and the basal ganglia. Stable chronic microvascular ischemic changes and parenchymal volume loss of the brain. Vascular: Contrast is present within the intracranial circulation. The left MCA M2 inferior division appears persistently patent. Skull: Normal. Negative for fracture or focal lesion. Sinuses/Orbits: Mild ethmoid sinus mucosal thickening. Right mastoid opacification with sclerosis compatible with chronic otomastoiditis. Soft tissue thickening of the right external auditory canal, correlate for possible otitis externa. No bony destructive changes. Bilateral intra-ocular lens replacement. Other: None. IMPRESSION: 1. Enhancement of left caudate head, scattered foci of the left frontal parietal lobes, and left lateral temporal lobe areas of acute infarction correspond to infarct core on the prior CT  perfusion. No new area of acute infarction identified at this  time. No significant mass effect. 2. Increased density within the left temporoparietal lobe hemorrhagic infarction probably represents superimposed enhancement. Evaluation for new hemorrhage is limited in the presence of contrast. 3. Soft tissue thickening of right external auditory canal, correlate for possible otitis externa. No bony destructive changes. Electronically Signed   By: Mitzi Hansen M.D.   On: 11/27/2016 05:08   Ct Head Wo Contrast  Result Date: 11/26/2016 CLINICAL DATA:  83 y/o M; inability to talk. Week with right-sided numbness. EXAM: CT HEAD WITHOUT CONTRAST TECHNIQUE: Contiguous axial images were obtained from the base of the skull through the vertex without intravenous contrast. COMPARISON:  10/21/2016 CT head and 10/18/2016 MRI of the head. FINDINGS: Brain: Hemorrhagic conversion of left parietooccipital infarction without significant mass effect or extra-axial collection. Stable small areas of now chronic infarction in right temporal periventricular white matter, left thalamus, and scattered through left frontal and parietal cortices. Stable bilateral frontal and left parietal chronic infarctions from prior study. Stable chronic microvascular ischemic changes and parenchymal volume loss of the brain. Subcentimeter focus of increased density within the left caudate head with associated hypoattenuation new from prior study compatible with a small hemorrhagic infarction (series 7, image 17). New small focus of hypoattenuation involving left lateral parietal cortex compatible with interval acute/ early subacute infarction (series 7, image 23). Vascular: Calcific atherosclerosis of carotid siphons. Left MCA hyperdensity in left lateral MCA cistern (series 7, image 11). Skull: Normal. Negative for fracture or focal lesion. Sinuses/Orbits: No acute finding. Other: None. IMPRESSION: 1. New foci of hypoattenuation within the left parietal cortex and left caudate head compatible with  interval late acute/early subacute infarction from 10/21/2016. Subcentimeter focus of hemorrhage within the new left caudate head infarction. No significant mass effect. 2. Hemorrhagic conversion of left parietooccipital infarction. No significant mass effect or extra-axial collection. 3. Multiple chronic infarcts as described are stable. 4. Hyperdense distal left MCA in the MCA cistern may represent thrombus. These results were called by telephone at the time of interpretation on 11/26/2016 at 11:50 pm to Dr. Rolland Porter , who verbally acknowledged these results. Electronically Signed   By: Mitzi Hansen M.D.   On: 11/26/2016 23:54   Ct Angio Neck W Or Wo Contrast  Result Date: 11/27/2016 CLINICAL DATA:  83 y/o  M; code stroke. EXAM: CT ANGIOGRAPHY HEAD AND NECK CT PERFUSION BRAIN TECHNIQUE: Multidetector CT imaging of the head and neck was performed using the standard protocol during bolus administration of intravenous contrast. Multiplanar CT image reconstructions and MIPs were obtained to evaluate the vascular anatomy. Carotid stenosis measurements (when applicable) are obtained utilizing NASCET criteria, using the distal internal carotid diameter as the denominator. Multiphase CT imaging of the brain was performed following IV bolus contrast injection. Subsequent parametric perfusion maps were calculated using RAPID software. CONTRAST:  90 cc Isovue 370 COMPARISON:  None. FINDINGS: CTA NECK FINDINGS Aortic arch: Ascending aortic aneurysm measuring up to 4.2 cm. Moderate to severe mixed plaque of the aortic arch with multiple plaque ulcerations. Great vessel origins are patent without high-grade stenosis. Three-vessel arch. Right carotid system: No evidence of dissection, stenosis (50% or greater) or occlusion. Moderate mixed plaque of the carotid bifurcation without significant stenosis. Left carotid system: No evidence of dissection, stenosis (50% or greater) or occlusion. Stent extending from  the distal left common carotid artery to the proximal left internal carotid artery. The stent is widely patent. Severe stenosis of left external  artery origin. Vertebral arteries: Left dominant. No evidence of dissection, stenosis (50% or greater) or occlusion. Hairpin loops of V1 segments bilaterally. Skeleton: Moderate cervical spondylosis with multilevel disc and facet degenerative changes. No high-grade bony canal stenosis. Other neck: Negative. Upper chest: Peripheral reticulation of the lung parenchyma compatible fibrosis. Enlarged mediastinal lymph nodes including a left lower paratracheal lymph node measuring 12 mm short axis, probably reactive. Review of the MIP images confirms the above findings CTA HEAD FINDINGS Anterior circulation: Left M2 inferior division proximal occlusion with poor collateral circulation in the left posterior MCA distribution. Left posterior communicating artery ICA origin is rounded and measures 4 mm in diameter compatible with aneurysmal dilatation (series 10, image 106). Otherwise no significant stenosis, occlusion, or aneurysm of the anterior circulation. Posterior circulation: No significant stenosis, occlusion, or vascular malformation. Left V4 segment focal ectasia measuring 6 mm with ulcerated mural plaque (series 8, image 156). Venous sinuses: As permitted by contrast timing, patent. Anatomic variants: Left fetal PCA. Patent small right posterior communicating artery and anterior communicating artery. Delayed phase: Right mastoid opacification and sclerosis compatible sequelae of chronic otomastoiditis. Review of the MIP images confirms the above findings CT Brain Perfusion Findings: CBF (<30%) Volume: 48mL Perfusion (Tmax>6.0s) volume: Mismatch Volume: 81mL Infarction Location:Left posterior MCA IMPRESSION: 1. Left middle cerebral artery proximal M2 inferior division occlusion. Poor collateral circulation in the left posterior MCA distribution. 2. CT perfusion  demonstrating left posterior MCA distribution core infarct with volume of 48 cc and ischemic penumbra of 81 cc by automated RAPID post processing. 3. Patent carotid and vertebral arteries of the neck. No high-grade stenosis by NASCET criteria, aneurysm, or dissection. 4. Stented left distal common carotid artery to proximal left internal carotid artery. The stent appears widely patent. 5. 4 mm rounded dilation of left posterior communicating artery ICA origin compatible with aneurysm. 6. Left V4 segment focal ectasia measuring 6 mm with ulcerated mural plaque. 7. 4.2 cm ascending aortic aneurysm. Recommend annual imaging followup by CTA or MRA. This recommendation follows 2010 ACCF/AHA/AATS/ACR/ASA/SCA/SCAI/SIR/STS/SVM Guidelines for the Diagnosis and Management of Patients with Thoracic Aortic Disease. Circulation. 2010; 121: W098-J191. 8. Moderate to severe mixed plaque of the aorta with multiple plaque ulcerations. These results were called by telephone at the time of interpretation on 11/27/2016 at 1:29am to PA [facility], who verbally acknowledged these results. Electronically Signed   By: Mitzi Hansen M.D.   On: 11/27/2016 01:34   Mr Brain Wo Contrast  Result Date: 11/27/2016 CLINICAL DATA:  Aphasia and right-sided weakness. History of strokes and atrial fibrillation. EXAM: MRI HEAD WITHOUT CONTRAST TECHNIQUE: Multiplanar, multiecho pulse sequences of the brain and surrounding structures were obtained without intravenous contrast. COMPARISON:  Head CT 11/27/2016 and MRI 10/18/2016 FINDINGS: Brain: There is a small to moderate-sized acute left MCA infarct involving the frontal operculum and middle frontal gyrus. There is a large posterior/inferior left MCA infarct involving much of the temporal lobe and some of the parietal lobe which is new from the prior MRI and with associated petechial hemorrhage again noted. A subacute left parieto-occipital infarct with petechial hemorrhage is also again  noted. A subacute left caudate infarct is new from the prior MRI with associated petechial hemorrhage. There are punctate acute or early subacute infarcts in the left lentiform nucleus. Subacute to chronic infarcts are present in both thalami and in the posterior limb of the right internal capsule. FLAIR hyperintensity in the left perimesencephalic cistern is consistent with small volume subarachnoid hemorrhage as seen on today's  head CT. Chronic microhemorrhages are present in the right thalamus. Chronic cortical infarcts are present in both frontal lobes and in the left parietal lobe. There is a chronic white matter infarct in the left centrum semiovale. There is moderate cerebral atrophy with unchanged asymmetric CSF over the left frontal convexity which is chronic and may be secondary to atrophy versus small subdural hygroma. No mass or midline shift. Vascular: Major intracranial vascular flow voids are preserved. Skull and upper cervical spine: Unremarkable bone marrow signal. Sinuses/Orbits: Bilateral cataract extraction. Clear paranasal sinuses. Right mastoid and middle ear fluid. Other: None. IMPRESSION: 1. Acute left lateral frontal lobe infarct. 2. Extensive subacute and chronic ischemia as above, including a large subacute left temporal lobe infarct which is new from 10/18/2016. Associated petechial hemorrhage. 3. Small volume subarachnoid hemorrhage in the left perimesencephalic cistern. Electronically Signed   By: Allen  Grady M.D.   On: 11/27/2016 10:30   Ct Cerebral Perfusion W Contrast  Result Date: 11/27/2016 CLINICAL DATA:  83 y/o  M; code stroke. EXAM: CT ANGIOGRAPHY HEAD AND NECK CT PERFUSION BRAIN TECHNIQUE: Multidetector CT imaging of the head and neck was performed using the standard protocol during bolus administration of intravenous contrast. Multiplanar CT image reconstructions and MIPs were obtained to evaluate the vascular anatomy. Carotid stenosis measurements (when applicable) are  obtained utilizing NASCET criteria, using the distal internal carotid diameter as the denominator. Multiphase CT imaging of the brain was performed following IV bolus contrast injection. Subsequent parametric perfusion maps were calculated using RAPID software. CONTRAST:  90 cc Isovue 370 COMPARISON:  None. FINDINGS: CTA NECK FINDINGS Aortic arch: Ascending aortic aneurysm measuring up to 4.2 cm. Moderate to severe mixed plaque of the aortic arch with multiple plaque ulcerations. Great vessel origins are patent without high-grade stenosis. Three-vessel arch. Right carotid system: No evidence of dissection, stenosis (50% or greater) or occlusion. Moderate mixed plaque of the carotid bifurcation without significant stenosis. Left carotid system: No evidence of dissection, stenosis (50% or greater) or occlusion. Stent extending from the distal left common carotid artery to the proximal left internal carotid artery. The stent is widely patent. Severe stenosis of left external artery origin. Vertebral arteries: Left dominant. No evidence of dissection, stenosis (50% or greater) or occlusion. Hairpin loops of V1 segments bilaterally. Skeleton: Moderate cervical spondylosis with multilevel disc and facet degenerative changes. No high-grade bony canal stenosis. Other neck: Negative. Upper chest: Peripheral reticulation of the lung parenchyma compatible fibrosis. Enlarged mediastinal lymph nodes including a left lower paratracheal lymph node measuring 12 mm short axis, probably reactive. Review of the MIP images confirms the above findings CTA HEAD FINDINGS Anterior circulation: Left M2 inferior division proximal occlusion with poor collateral circulation in the left posterior MCA distribution. Left posterior communicating artery ICA origin is rounded and measures 4 mm in diameter compatible with aneurysmal dilatation (series 10, image 106). Otherwise no significant stenosis, occlusion, or aneurysm of the anterior  circulation. Posterior circulation: No significant stenosis, occlusion, or vascular malformation. Left V4 segment focal ectasia measuring 6 mm with ulcerated mural plaque (series 8, image 156). Venous sinuses: As permitted by contrast timing, patent. Anatomic variants: Left fetal PCA. Patent small right posterior communicating artery and anterior communicating artery. Delayed phase: Right mastoid opacification and sclerosis compatible sequelae of chronic otomastoiditis. Review of the MIP images confirms the above findings CT Brain Perfusion Findings: CBF (<30%) Volume: [id number] Corwinmax>6.0s5[place]1m v478Geralyn CorwinREME27Fonnie 6[place]8muV478Geraly8[place]9m CorwinNT25Fonnie MuMEASUR<MEASUREMENT5Fonnie MuMEASUREMENT>a Sheetsion Location:Left posterior MCA IMPRESSION: 1. Left middle cerebral artery proximal M2 inferior  division occlusion. Poor collateral circulation in the left posterior MCA distribution. 2. CT perfusion demonstrating left posterior MCA distribution core infarct with volume of 48 cc and ischemic penumbra of 81 cc by automated RAPID post processing. 3. Patent carotid and vertebral arteries of the neck. No high-grade stenosis by NASCET criteria, aneurysm, or dissection. 4. Stented left distal common carotid artery to proximal left internal carotid artery. The stent appears widely patent. 5. 4 mm rounded dilation of left posterior communicating artery ICA origin compatible with aneurysm. 6. Left V4 segment focal ectasia measuring 6 mm with ulcerated mural plaque. 7. 4.2 cm ascending aortic aneurysm. Recommend annual imaging followup by CTA or MRA. This recommendation follows 2010 ACCF/AHA/AATS/ACR/ASA/SCA/SCAI/SIR/STS/SVM Guidelines for the Diagnosis and Management of Patients with Thoracic Aortic Disease. Circulation. 2010; 121: W098-J191. 8. Moderate to severe mixed plaque of the aorta with multiple plaque ulcerations. These results were called by telephone at the time of interpretation on 11/27/2016 at 1:29am to [facility], who verbally acknowledged these results. Electronically Signed    By: Mitzi Hansen M.D.   On: 11/27/2016 01:34   Portable Chest Xray  Result Date: 11/28/2016 CLINICAL DATA:  83 year old male with history of stroke. Evaluate endotracheal tube placement. EXAM: PORTABLE CHEST 1 VIEW COMPARISON:  Chest x-ray 11/27/2016. FINDINGS: An endotracheal tube is in place with tip 3.1 cm above the carina. Lung volumes are low. Linear scarring in the left mid lung is unchanged. New area of interstitial prominence an ill-defined opacities in the right lung base may reflect developing airspace consolidation either within the right middle or lower lobe) favored to be a right lower lobe). Mild blunting of the right costophrenic sulcus, suggestive of a small right pleural effusion. No definite left pleural effusion) however, left costophrenic sulcus is excluded from the image). Crowding of the pulmonary vasculature, without frank pulmonary edema. Heart size is mildly enlarged. The patient is rotated to the left on today's exam, resulting in distortion of the mediastinal contours and reduced diagnostic sensitivity and specificity for mediastinal pathology. Atherosclerosis in the thoracic aorta. IMPRESSION: 1. Support apparatus, as above. 2. New ill-defined opacities in the base of the right lung concerning for developing airspace consolidation, favored to be in the right lower lobe. 3. Probable small right pleural effusion. 4. Aortic atherosclerosis. Electronically Signed   By: Trudie Reed M.D.   On: 11/28/2016 07:16   Portable Chest Xray  Result Date: 11/27/2016 CLINICAL DATA:  Intubation . EXAM: PORTABLE CHEST 1 VIEW COMPARISON:  10/20/2016. FINDINGS: Endotracheal tube tip noted 2.7 cm above the carina. Cardiomegaly with normal pulmonary vascularity. Low lung volumes with mild mild bilateral atelectatic changes. No pleural effusion or pneumothorax . IMPRESSION: 1. Endotracheal tube tip noted 2.7 cm above the carina. 2. Cardiomegaly with normal pulmonary vascularity. 3. Low  lung volumes with mild bilateral atelectatic changes . Electronically Signed   By: Maisie Fus  Register   On: 11/27/2016 09:31   Ir Percutaneous Art Thrombectomy/infusion Intracranial Inc Diag Angio  Result Date: 11/28/2016 INDICATION: Acute onset of worsening right-sided weakness and aphasia. Abnormal CT of the brain with a hyperdense left MCA sign, and occluded left middle cerebral artery on CT angiogram. EXAM: 1. EMERGENT LARGE VESSEL OCCLUSION THROMBOLYSIS anterior CIRCULATION) COMPARISON:  CT angiogram of the head and neck of 11/27/2016. MEDICATIONS: Ancef 2 g IV antibiotic was administered within 1 hour of the procedure. ANESTHESIA/SEDATION: General anesthesia CONTRAST:  Isovue 300 50 mL. FLUOROSCOPY TIME:  Fluoroscopy Time: 25 minutes 43 seconds (1054 mGy). COMPLICATIONS: None immediate. TECHNIQUE: Following  a full explanation of the procedure along with the potential associated complications, an informed witnessed consent was obtained from the patient's daughter and son. The risks of intracranial hemorrhage of 10%, worsening neurological deficit, ventilator dependency, death and inability to revascularize were all reviewed in detail with the patient's daughter and son. The patient was then put under general anesthesia by the Department of Anesthesiology at [facility]. The right groin was prepped and draped in the usual sterile fashion. Thereafter using modified Seldinger technique, transfemoral access into the right common femoral artery was obtained without difficulty. Over a 0.035 inch guidewire a 5 French Pinnacle sheath was inserted. Through this, and also over a 0.035 inch guidewire a 5 [place] JB 1 catheter was advanced to the aortic arch region and selectively positioned in the left common carotid artery. FINDINGS: The left common carotid arteriogram demonstrates the left external carotid artery origin to be narrowed by about 50% proximally. Its branches are normally opacified. The left  internal carotid artery at the bulb demonstrates an approximately 30% stenosis within the previously positioned stent extending from the distal left common carotid artery to the proximal left internal carotid artery. More distally, the left internal carotid artery is seen to opacify normally to the cranial skull base. The petrous, cavernous and supraclinoid segments demonstrate wide patency. The left anterior cerebral artery opacifies normally into the capillary and venous phases. The left middle cerebral artery just distal to the origin of the anterior temporal branch demonstrates complete angiographic occlusion. Also noted is a prominent left posterior communicating artery opacifying the left posterior cerebral artery distribution. An approximately 2.5 mm infundibulum is seen at the origin of the left posterior communicating artery. PROCEDURE: ENDOVASCULAR COMPLETE REVASCULARIZATION OF OCCLUDED LEFT MIDDLE CEREBRAL ARTERY DISTAL TO THE ANTERIOR TEMPORAL BRANCH WITH 1 PASS WITH THE SOLITAIRE FR 4 MM X 40 MM RETRIEVAL DEVICE. The diagnostic JB 1 catheter in the left common carotid artery was exchanged over a 0.035 inch 300 cm Rosen exchange guidewire for an 8 French 55 cm Brite tip neurovascular sheath using biplane roadmap technique and constant fluoroscopic guidance. Good aspiration obtained from the side port of the neurovascular sheath. This was then connected to continuous heparinized saline infusion. Over the [employer] guidewire, an 8 [place] 85 cm FlowGate balloon guide catheter which had been prepped with 50% contrast and 50% heparinized saline infusion was advanced and positioned just proximal to the left common carotid bifurcation. The guidewire was removed. Good aspiration obtained from the hub of the 8 [place] FlowGate guide catheter. A gentle contrast injection demonstrated no evidence of spasms, dissections or of intraluminal filling defects. The tip of the 8 [place] FlowGate guide catheter was  positioned just proximal to the proximal placement of the carotid stent mentioned above. Using biplane roadmap technique and constant fluoroscopic guidance, in a coaxial manner and with constant heparinized saline infusion, a combination of the 021 Trevo ProVue microcatheter inside of a 5 French 115 cm Catalyst guide catheter was advanced over a 0.014 inch Softip Synchro micro guidewire to the distal end of the [facility] guide catheter. The micro guidewire had a J configuration to avoid dissections or risking entanglement with the struts of the previously positioned stent. This combination was then advanced without difficulty to the supraclinoid left ICA. The Catalyst guide catheter was positioned at the cervical petrous junction of the left internal carotid artery. Using a torque device, the micro guidewire was then gently advanced without difficulty through the occluded left middle cerebral artery into  the inferior division of the left middle cerebral artery M2 M3 region followed by the microcatheter. The guidewire was removed. Good aspiration obtained from the hub of the microcatheter. A gentle contrast injection demonstrated good antegrade flow distal to the tip of the microcatheter. At this time, a 4 mm x 40 mm Solitaire FR retrieval device was then advanced in a coaxial manner and with constant heparinized saline infusion to the distal end of the microcatheter. The O ring on the delivery microcatheter was loosened. With slight forward gentle traction with the right hand on the delivery micro guidewire, with the left hand the delivery microcatheter was retrieved unsheathing the distal and then the proximal portion of the retrieval device. Excellent coverage was noted of the occluded left middle cerebral artery with the proximal portion just proximal to the occluded portion. A control arteriogram performed through the 5 [place] Catalyst guide catheter in the left internal carotid artery demonstrated a TICI 2b  reperfusion. At this time, the proximal portion of the retrieval device was captured into the microcatheter. The occlusion balloon of the FlowGate guide catheter was inflated in the distal left common carotid artery proximal to the proximal portion of the stent. Thereafter as constant aspiration was applied with a 60 mL syringe on the side port of the 8 [place] FlowGate guide catheter, and with a 20 mL syringe at the hub of the 5 [place] Catalyst guide catheter, the combination of the retrieval device, the microcatheter and the 5 [place] guide catheter was retrieved and removed as aspiration was continued. The interstices of the retrieval device revealed chunks of clot entangled within the interstices. Also noted was a chunk of clot within the Tuohy Borst. Aspiration of the hub of the [facility] guide catheter was continued with a 60 mL syringe as the balloon was deflated in the left common carotid artery. Free back bleed of blood was noted at the hub of the 8 [place] FlowGate guide catheter. A control arteriogram performed through the 8 [place] FlowGate guide catheter in the left common carotid artery demonstrated complete angiographic revascularization of the left middle cerebral artery achieving a 3 reperfusion. Patency was maintained of the left posterior cerebral artery, and the left anterior cerebral artery. No evidence of extravasation of contrast, or mass-effect on the major vessels on the left side of the brain was seen. Throughout the procedure, the patient's neurological status and hemodynamic status remained stable. The 8 [place] FlowGate guide catheter, and the 8 [place] neurovascular sheath were then retrieved into the abdominal aorta and exchanged over a J-tip guidewire for a 9 [place] Pinnacle sheath. This in turn was then connected to continuous heparinized saline infusion. At the end of the procedure, the right groin appeared soft without evidence of bleeding. The distal pulses and the dorsalis pedis and the  posterior tibials remained 3+ in the dorsalis pedis palpable, and Dopplerable in the posterior tibials bilaterally unchanged from prior to the procedure. IMPRESSION: Status post endovascular complete revascularization of occluded left middle cerebral artery M1 segment with 1 pass using the Solitaire FR 4 mm x 40 mm retrieval device achieving a TICI 3 reperfusion. PLAN: Patient transferred to the CT suite for postprocedural CT scan of the brain. Electronically Signed   By: Julieanne Cotton M.D.   On: 11/27/2016 19:34    Labs:  CBC:  Recent Labs  10/21/16 1730 11/05/16 1032 11/26/16 2256 11/26/16 2307 11/28/16 0442  WBC 6.0 7.6 7.4  --  11.9*  HGB 8.5* 10.0* 9.5* 9.9* 8.3*  HCT 24.9* 29.4* 28.7* 29.0* 25.2*  PLT 192 197 196  --  229    COAGS:  Recent Labs  07/18/16 0344 10/18/16 0800 11/26/16 2256 11/27/16 1343  INR 1.19 1.29 2.75 1.67  APTT 45* 59* 70*  --     BMP:  Recent Labs  10/21/16 1730 11/05/16 1032 11/26/16 2256 11/26/16 2307 11/28/16 0442  NA 131* 122* 131* 134* 133*  K 3.6 4.8 4.5 3.8 3.5  CL 104 87* 99* 96* 104  CO2 21* 22 24  --  18*  GLUCOSE 128* 92 130* 125* 130*  BUN 13 9 8 10 9   CALCIUM 8.5* 8.8 9.0  --  8.0*  CREATININE 1.73* 1.34* 1.37* 1.40* 1.46*  GFRNONAA 33* 47* 44*  --  41*  GFRAA 39* 54* 51*  --  48*    LIVER FUNCTION TESTS:  Recent Labs  10/08/16 1934 10/18/16 0800 11/05/16 1032 11/26/16 2256  BILITOT 1.4* 1.1 1.0 1.0  AST 21 26 26 28   ALT 13* 15* 10 16*  ALKPHOS 74 80 93 76  PROT 7.8 8.5* 7.6 7.6  ALBUMIN 3.4* 3.4* 3.8 3.3*    Assessment and Plan: 1. CVA, s/p Lt common carotid arteriogram followed by complete revascularization of occluded Lt MCA with x 1pass with 74mm x 40 mm solitaire FR retriever device achieving a TICI 3 reperfusion  Patient just extubated.  He is able to follow commands.  He has some aphasia per the RN.  His right inguinal site looks good.  No further NIR interventions.  Further plans per neurology.   The patient will need to follow up with Dr. Corliss Skains in 2 weeks after discharge.  Electronically Signed: Letha Cape 11/28/2016, 1:44 PM   I spent a total of 15 Minutes at the the patient's bedside AND on the patient's hospital floor or unit, greater than 50% of which was counseling/coordinating care for CVA

## 2016-11-28 NOTE — Procedures (Signed)
Extubation Procedure Note  Patient Details:   Name: [name] DOB: [birth year] MRN: [id number]   Airway Documentation:  Airway 7.5 mm (Active)  Secured at (cm) 22 cm 11/28/2016  8:41 AM  Measured From Lips 11/28/2016  8:41 AM  Secured Location Left 11/28/2016  8:41 AM  Secured By [employer] 11/28/2016  8:41 AM  Tube Holder Repositioned Yes 11/28/2016  8:41 AM  Cuff Pressure (cm H2O) 26 cm H2O 11/28/2016  8:41 AM  Site Condition Dry 11/28/2016  8:41 AM    Evaluation  O2 sats: stable throughout Complications: No apparent complications Patient did tolerate procedure well. Bilateral Breath Sounds: Clear   Yes  [name] 11/28/2016, 11:29 AM

## 2016-11-28 NOTE — Progress Notes (Signed)
I was called to see patient for tachypnea. On arrival, RR 26 with mild accessory muscle use. Wheezes b/l on auscultation. Has been getting Pulmicort and Brovana nebs BID and Duonebs q6hrs; Will add Solumedrol 40mg  IV q6hrs and PRN Albuterol nebs. Asked RT to give an additional albuterol neb now. RN says she has been doing chest PT via bed module and that patient is expectorating some; will place order for scheduled chest PT q4hrs. Keep patient in ICU overnight for close monitoring. Keep NPO. May need NIPPV if work of breathing doesn't calm down with neb and steroids.

## 2016-11-28 NOTE — Progress Notes (Signed)
Pt extubated at 1125 per protocol. Leak test positive Hr 93, rr 27 BP 138/65. Pt followed simple directions. Pt was able to cough . Pt was stable . RT will continue to monitor

## 2016-11-29 DIAGNOSIS — I63412 Cerebral infarction due to embolism of left middle cerebral artery: Principal | ICD-10-CM

## 2016-11-29 DIAGNOSIS — Z7901 Long term (current) use of anticoagulants: Secondary | ICD-10-CM

## 2016-11-29 DIAGNOSIS — E785 Hyperlipidemia, unspecified: Secondary | ICD-10-CM

## 2016-11-29 LAB — BASIC METABOLIC PANEL
Anion gap: 9 (ref 5–15)
BUN: 13 mg/dL (ref 6–20)
CHLORIDE: 104 mmol/L (ref 101–111)
CO2: 18 mmol/L — AB (ref 22–32)
Calcium: 8.3 mg/dL — ABNORMAL LOW (ref 8.9–10.3)
Creatinine, Ser: 1.3 mg/dL — ABNORMAL HIGH (ref 0.61–1.24)
GFR calc non Af Amer: 47 mL/min — ABNORMAL LOW (ref >60)
GFR, EST AFRICAN AMERICAN: 55 mL/min — AB (ref >60)
Glucose, Bld: 161 mg/dL — ABNORMAL HIGH (ref 65–99)
POTASSIUM: 3.4 mmol/L — AB (ref 3.5–5.1)
SODIUM: 131 mmol/L — AB (ref 135–145)

## 2016-11-29 LAB — URINALYSIS, ROUTINE W REFLEX MICROSCOPIC
Bilirubin Urine: NEGATIVE
Glucose, UA: NEGATIVE mg/dL
Ketones, ur: 5 mg/dL — AB
Leukocytes, UA: NEGATIVE
Nitrite: NEGATIVE
Protein, ur: NEGATIVE mg/dL
SPECIFIC GRAVITY, URINE: 1.015 (ref 1.005–1.030)
pH: 6 (ref 5.0–8.0)

## 2016-11-29 LAB — CBC WITH DIFFERENTIAL/PLATELET
Basophils Absolute: 0 10*3/uL (ref 0.0–0.1)
Basophils Relative: 0 %
EOS ABS: 0 10*3/uL (ref 0.0–0.7)
Eosinophils Relative: 0 %
HEMATOCRIT: 28.2 % — AB (ref 39.0–52.0)
HEMOGLOBIN: 9.5 g/dL — AB (ref 13.0–17.0)
LYMPHS ABS: 0.3 10*3/uL — AB (ref 0.7–4.0)
LYMPHS PCT: 3 %
MCH: 30.9 pg (ref 26.0–34.0)
MCHC: 33.7 g/dL (ref 30.0–36.0)
MCV: 91.9 fL (ref 78.0–100.0)
MONOS PCT: 0 %
Monocytes Absolute: 0 10*3/uL — ABNORMAL LOW (ref 0.1–1.0)
NEUTROS ABS: 9.7 10*3/uL — AB (ref 1.7–7.7)
NEUTROS PCT: 97 %
Platelets: 182 10*3/uL (ref 150–400)
RBC: 3.07 MIL/uL — AB (ref 4.22–5.81)
RDW: 15.1 % (ref 11.5–15.5)
WBC: 10.1 10*3/uL (ref 4.0–10.5)

## 2016-11-29 LAB — PHOSPHORUS: Phosphorus: 3.1 mg/dL (ref 2.5–4.6)

## 2016-11-29 LAB — MAGNESIUM: Magnesium: 1.8 mg/dL (ref 1.7–2.4)

## 2016-11-29 MED ORDER — METHYLPREDNISOLONE SODIUM SUCC 125 MG IJ SOLR
60.0000 mg | Freq: Four times a day (QID) | INTRAMUSCULAR | Status: DC
  Administered 2016-11-29 – 2016-12-02 (×12): 60 mg via INTRAVENOUS
  Filled 2016-11-29 (×13): qty 2

## 2016-11-29 MED ORDER — POTASSIUM CHLORIDE 10 MEQ/100ML IV SOLN
10.0000 meq | INTRAVENOUS | Status: AC
  Administered 2016-11-29 (×4): 10 meq via INTRAVENOUS
  Filled 2016-11-29 (×4): qty 100

## 2016-11-29 MED ORDER — MAGNESIUM SULFATE IN D5W 1-5 GM/100ML-% IV SOLN
1.0000 g | Freq: Once | INTRAVENOUS | Status: AC
  Administered 2016-11-29: 1 g via INTRAVENOUS
  Filled 2016-11-29 (×2): qty 100

## 2016-11-29 MED ORDER — PIPERACILLIN-TAZOBACTAM 3.375 G IVPB
3.3750 g | Freq: Three times a day (TID) | INTRAVENOUS | Status: DC
  Administered 2016-11-29 – 2016-12-03 (×12): 3.375 g via INTRAVENOUS
  Filled 2016-11-29 (×15): qty 50

## 2016-11-29 NOTE — Evaluation (Signed)
Clinical/Bedside Swallow Evaluation Patient Details  Name: [name] MRN: [id number] Date of Birth: 03/11/1926  Today's Date: 11/29/2016 Time: SLP Start Time (ACUTE ONLY): 1401 SLP Stop Time (ACUTE ONLY): 1408 SLP Time Calculation (min) (ACUTE ONLY): 7 min  Past Medical History:  Past Medical History:  Diagnosis Date  . AAA (abdominal aortic aneurysm) (HCC)   . Anemia   . Asthma   . Carotid artery disease (HCC)   . CHF (congestive heart failure) (HCC)   . Coronary artery disease   . Depression   . Gout   . H. pylori infection   . Hard of hearing   . Hiatal hernia   . Hyperplasia, prostate   . Hypertension   . PUD (peptic ulcer disease)   . Renal failure, acute (HCC) 11/08/2012   Past Surgical History:  Past Surgical History:  Procedure Laterality Date  . CAROTID STENT INSERTION  2015  . CAROTID STENT INSERTION N/A 05/09/2013   Procedure: CAROTID STENT INSERTION;  Surgeon: Runell GessJonathan J Berry, MD;  [facility];  Service: Cardiovascular;  Laterality: N/A;  . ESOPHAGOGASTRODUODENOSCOPY  06/13/2008,12/31/12  . IR PERCUTANEOUS ART THROMBECTOMY/INFUSION INTRACRANIAL INC DIAG ANGIO  11/27/2016  . LEFT HEART CATHETERIZATION WITH CORONARY ANGIOGRAM N/A 05/04/2013   Procedure: LEFT HEART CATHETERIZATION WITH CORONARY ANGIOGRAM;  Surgeon: Peter M SwazilandJordan, MD;  [facility];  Service: Cardiovascular;  Laterality: N/A;  . RADIOLOGY WITH ANESTHESIA N/A 11/27/2016   Procedure: IR WITH ANESTHESIA;  Surgeon: Julieanne Cottoneveshwar, Sanjeev, MD;  Location: [facility] OR;  Service: Radiology;  Laterality: N/A;   HPI:  [name] a 83 y.o.malewith PMH  including multiple strokes in the past, A.fib on xarelto. [facility] with acute aphasia and right sided weakness. Had similar symptoms with acute CVA 1 month prior. After admission pt underwent complete revascularization of occluded left MCA. Per IR notes, had small hemorrhagic conversion in left caudate and left  parietal occipital lobes. Intubated from 10/17-10/19. Increased work of breathing post intubation, possibly baseline due to COPD per notes. Likely some ventilator acquired aspiration pna per notes. MBS on 10/20/16 from prior CVA admission [facility] though dys 2 diet recommended given mild oral deficits with missing dentition. Pt is known to have baseline aphasia from prior CVA; last SLE 07/18/16 reported persistent aphasia and resolution of dysarthria.    Assessment / Plan / Recommendation Clinical Impression  Patient presents with increased risk for aspiration in the setting of new L CVA, decreased respiratory status s/p 3 day intubation. Upon SLP arrival, pt coughing, RR in upper 20s, has been off bipap for 5-10 minutes. [name] present and interpretting for pt. Oral motor examination attempted, however difficult to assess pt's strength, ROM. Pt opens mouth on command, however other commands resulted in groping movements. Difficult to assess pt's comprehension of commands, but he does appear to have some oral apraxia. With teaspoons ice, thin liquids, pt presents with immediate, weak cough and increased work of breathing, suggestive of reduced airway protection. Explained risks of aspiration to daughter in light of pt's new CVA as well as his overall diminished respiratory status, recent intubation. As SLP documented outside pt's room, pt became tachypneic into low 40s; informed RN and RT arrived to place pt back on bipap. Recommend he remain NPO for now; will follow for PO readiness pending improvements in his respiratory status.   SLP Visit Diagnosis: Dysphagia, oropharyngeal phase (R13.12)    Aspiration Risk  Severe aspiration risk    Diet  Recommendation NPO;Ice chips PRN after oral care   Liquid Administration via: Spoon Medication Administration: Via alternative means Supervision: Full supervision/cueing for compensatory strategies    Other  Recommendations Oral Care  Recommendations: Oral care QID;Oral care prior to ice chip/H20 Other Recommendations: Remove water pitcher;Have oral suction available   Follow up Recommendations Other (comment) (TBD)      Frequency and Duration min 2x/week  2 weeks       Prognosis Prognosis for Safe Diet Advancement: Good Barriers to Reach Goals: Language deficits      Swallow Study   General Date of Onset: 11/26/16 HPI: [name] a 83 y.o.malewith PMH  including multiple strokes in the past, A.fib on xarelto. He presented to [facility] ED 10/17 with acute aphasia and right sided weakness. Had similar symptoms with acute CVA 1 month prior. After admission pt underwent complete revascularization of occluded left MCA. Per IR notes, had small hemorrhagic conversion in left caudate and left parietal occipital lobes. Intubated from 10/17-10/19. Increased work of breathing post intubation, possibly baseline due to COPD per notes. Likely some ventilator acquired aspiration pna per notes. MBS on 10/20/16 from prior CVA admission [facility] though dys 2 diet recommended given mild oral deficits with missing dentition. Pt is known to have baseline aphasia from prior CVA; last SLE 07/18/16 reported persistent aphasia and resolution of dysarthria.  Type of Study: Bedside Swallow Evaluation Previous Swallow Assessment: see HPI Diet Prior to this Study: NPO Temperature Spikes Noted: No Respiratory Status: Nasal cannula History of Recent Intubation: Yes Length of Intubations (days): 3 days Date extubated: 11/28/16 Behavior/Cognition: Alert;Cooperative Oral Cavity Assessment: Within Functional Limits Oral Care Completed by SLP: Yes Oral Cavity - Dentition: Edentulous Vision: Functional for self-feeding Self-Feeding Abilities: Total assist Patient Positioning: Upright in bed Baseline Vocal Quality: Hoarse Volitional Cough: Weak Volitional Swallow: Unable to elicit    Oral/Motor/Sensory Function Overall Oral  Motor/Sensory Function: Other (comment) Facial ROM:  (limited, ?apraxic) Facial Strength: Reduced left (suspect due to l anterior spillage)   Ice Chips Ice chips: Impaired Pharyngeal Phase Impairments: Cough - Immediate   Thin Liquid Thin Liquid: Impaired Presentation: Cup;Spoon;Straw Oral Phase Impairments: Reduced labial seal Oral Phase Functional Implications: Left anterior spillage Pharyngeal  Phase Impairments: Cough - Immediate;Cough - Delayed;Multiple swallows    Nectar Thick Nectar Thick Liquid: Not tested   Honey Thick Honey Thick Liquid: Not tested   Puree Puree: Not tested   Solid   GO   Rondel Baton, MS, CCC-SLP Speech-Language Pathologist [phone] Solid: Not tested        Arlana Lindau 11/29/2016,2:25 PM

## 2016-11-29 NOTE — Progress Notes (Signed)
Pt placed on Bipap per MD order for increased WOB.  RN at bedside.

## 2016-11-29 NOTE — Progress Notes (Signed)
CPT not done at this time. Pt on Bipap.  MD notified.

## 2016-11-29 NOTE — Progress Notes (Signed)
PULMONARY / CRITICAL CARE MEDICINE   Name: [name] MRN: [id number] DOB: October 21, 1926    ADMISSION DATE:  11/26/2016 CONSULTATION DATE:  11/27/16  REFERRING MD:  Otelia Limes  CHIEF COMPLAINT:  Aphasia  HISTORY OF PRESENT ILLNESS:  Pt is encephelopathic; therefore, this HPI is obtained from chart review. [name] is a 83 y.o. male with PMH as outlined below including multiple strokes in the past, A.fib on xarelto.  He presented to [facility] ED 10/17 with acute aphasia and right sided weakness.  Had similar symptoms with acute CVA 1 month prior.  In ED, CT demonstrated dense left M2 segment.  He was taken to IR where he had left common carotid arteriogram followed by complete revascularization of occluded left MCA.  Per IR notes, had small hemorrhagic conversion in left caudate and left parietal occipital.  He returned to the ICU on the ventilator and PCCM was asked to see for vent management.  PAST MEDICAL HISTORY :  He  has a past medical history of AAA (abdominal aortic aneurysm) (HCC); Anemia; Asthma; Carotid artery disease (HCC); CHF (congestive heart failure) (HCC); Coronary artery disease; Depression; Gout; H. pylori infection; Hard of hearing; Hiatal hernia; Hyperplasia, prostate; Hypertension; PUD (peptic ulcer disease); and Renal failure, acute (HCC) (11/08/2012).  PAST SURGICAL HISTORY: He  has a past surgical history that includes Esophagogastroduodenoscopy (06/13/2008,12/31/12); Carotid stent insertion (2015); left heart catheterization with coronary angiogram (N/A, 05/04/2013); carotid stent insertion (N/A, 05/09/2013); IR PERCUTANEOUS ART THROMBECTOMY/INFUSION INTRACRANIAL INC DIAG ANGIO (11/27/2016); and Radiology with anesthesia (N/A, 11/27/2016).  Allergies  Allergen Reactions  . Levofloxacin Anaphylaxis and Other (See Comments)    Patient told his daughter that it made him "feel worse than he already did" (overall) Headache also (has refused to take anymore)  .  Eliquis [Apixaban] Itching and Swelling  . Phenazopyridine Hcl Other (See Comments)    Unknown reaction  . Septra [Sulfamethoxazole-Trimethoprim] Nausea And Vomiting and Other (See Comments)    GI Upset  . Latex Rash  . Tape Rash    No current facility-administered medications on file prior to encounter.    Current Outpatient Prescriptions on File Prior to Encounter  Medication Sig  . allopurinol (ZYLOPRIM) 300 MG tablet Take 300 mg by mouth daily. To prevent gout  . arformoterol (BROVANA) 15 MCG/2ML NEBU Take 2 mLs (15 mcg total) by nebulization 2 (two) times daily.  [name] aspirin EC 81 MG tablet Take 1 tablet (81 mg total) by mouth daily.  [name] atorvastatin (LIPITOR) 40 MG tablet Take 1 tablet (40 mg total) by mouth daily at 6 PM.  . budesonide (PULMICORT) 0.5 MG/2ML nebulizer solution Take 2 mLs (0.5 mg total) by nebulization 2 (two) times daily.  . carvedilol (COREG) 6.25 MG tablet Take 1 tablet (6.25 mg total) by mouth 2 (two) times daily with a meal.  . esomeprazole (NEXIUM) 40 MG capsule Take 1 capsule (40 mg total) by mouth 2 (two) times daily before a meal.  . ipratropium-albuterol (DUONEB) 0.5-2.5 (3) MG/3ML SOLN Take 3 mLs by nebulization 2 (two) times daily as needed (for shortness of breath or wheezing).  . isosorbide mononitrate (IMDUR) 30 MG 24 hr tablet Take 30 mg by mouth once daily (Patient taking differently: Take 30 mg by mouth daily. For control of heart pain)  . meclizine (ANTIVERT) 25 MG tablet Take 25 mg by mouth 3 (three) times daily as needed for dizziness.   [name] NITROSTAT 0.4 MG SL tablet PLACE 1 TABLET UNDER THE TONGUE EVERY 5 MINUTES FOR 3  DOSES AS NEEDED FOR CHEST PAIN (Patient taking differently: PLACE 0.4 MG UNDER THE TONGUE EVERY 5 MINUTES FOR 3 DOSES AS NEEDED FOR CHEST PAIN)  . tamsulosin (FLOMAX) 0.4 MG CAPS capsule Take 0.4 mg by mouth daily. To improve bladder function  . XARELTO 15 MG TABS tablet Take 15 mg by mouth daily.   . dabigatran (PRADAXA) 75 MG CAPS  capsule Take 1 capsule (75 mg total) by mouth every 12 (twelve) hours. (Patient not taking: Reported on 11/26/2016)  . [DISCONTINUED] apixaban (ELIQUIS) 2.5 MG TABS tablet Take 1 tablet (2.5 mg total) by mouth 2 (two) times daily.  . [DISCONTINUED] pantoprazole (PROTONIX) 40 MG tablet Take 1 tablet (40 mg total) by mouth 2 (two) times daily.   FAMILY HISTORY:  His indicated that his mother is deceased. He indicated that his father is deceased. He indicated that the status of his neg hx is unknown.   SOCIAL HISTORY: He  reports that he quit smoking about 14 years ago. He has never used smokeless tobacco. He reports that he does not drink alcohol or use drugs.  REVIEW OF SYSTEMS:   Review of Systems  Unable to perform ROS: Critical illness   SUBJECTIVE:  Extubated on 10/19 AM. Began having wheezing and tachypnea overnight; started on IV steroids and continued on nebs. This AM tachypnea has resolved but still mild accessory muscle use and wheezing.   VITAL SIGNS: BP (!) 160/95   Pulse 82   Temp 99.2 F (37.3 C) (Core (Comment))   Resp (!) 23   Ht 5\' 5"  (1.651 m)   Wt 63.9 kg (140 lb 14 oz)   SpO2 100%   BMI 23.44 kg/m   VENTILATOR SETTINGS: FiO2 (%):  [40 %] 40 %  INTAKE / OUTPUT: I/O last 3 completed shifts: In: 3320.8 [I.V.:2620.8; IV Piggyback:700] Out: 1310 [Urine:1310]  PHYSICAL EXAMINATION: General: WDWN elderly male, chronically ill appearing, lying in ICU bed in NAD Neuro: RASS 0, opens eyes to voice, obeying commands Cardiovascular: regular rhythm, tachycardic to 100's, no m/r/g Lungs: Wheezing b/l, mild accessory muse use, RR 20-22 Abdomen:  Soft NTND Musculoskeletal: no LE edema; missing digit on right hand   Skin: no rashes   LABS:  BMET  Recent Labs Lab 11/26/16 2256 11/26/16 2307 11/28/16 0442 11/29/16 0419  NA 131* 134* 133* 131*  K 4.5 3.8 3.5 3.4*  CL 99* 96* 104 104  CO2 24  --  18* 18*  BUN 8 10 9 13   CREATININE 1.37* 1.40* 1.46* 1.30*   GLUCOSE 130* 125* 130* 161*    Electrolytes  Recent Labs Lab 11/26/16 2256 11/28/16 0442 11/29/16 0419  CALCIUM 9.0 8.0* 8.3*  MG  --  1.4* 1.8  PHOS  --  3.1 3.1    CBC  Recent Labs Lab 11/26/16 2256 11/26/16 2307 11/28/16 0442 11/29/16 0419  WBC 7.4  --  11.9* 10.1  HGB 9.5* 9.9* 8.3* 9.5*  HCT 28.7* 29.0* 25.2* 28.2*  PLT 196  --  229 182    Coag's  Recent Labs Lab 11/26/16 2256 11/27/16 1343  APTT 70*  --   INR 2.75 1.67    Sepsis Markers No results for input(s): LATICACIDVEN, PROCALCITON, O2SATVEN in the last 168 hours.  ABG  Recent Labs Lab 11/27/16 0523 11/28/16 0324 11/28/16 1048  PHART 7.269* 7.373 7.377  PCO2ART 48.2* 31.7* 25.4*  PO2ART 432.0* 124* 117.0*    Liver Enzymes  Recent Labs Lab 11/26/16 2256  AST 28  ALT 16*  ALKPHOS 76  BILITOT 1.0  ALBUMIN 3.3*    Cardiac Enzymes No results for input(s): TROPONINI, PROBNP in the last 168 hours.  Glucose No results for input(s): GLUCAP in the last 168 hours.  Imaging No results found. STUDIES:  CT cerebral perfusion 10/18 > left M2 occlusion, left posterior MCA core infarct. 4.2cm ascending aortic aneurysm.  SIGNIFICANT EVENTS: 10/18 > admit, to IR for revascularization. 10/19: fever overnight, pancultured, started antibiotics  LINES/TUBES: ETT 10/18 >  R femoral sheath 10/18 >10/18 PM 2-PIV's   DISCUSSION: 83 y.o. male with hx A.fib on xarelto and multiple strokes, admitted 10/18 with left MCA infarct.  Taken to IR for revascularization then returned to ICU on vent. Per IR notes, had small hemorrhagic conversion in left caudate and left parietal occipital.  ASSESSMENT / PLAN:  PULMONARY 1. Acute Hypoxic respiratory failure; Aspiration Pneumonia: - extubated 10/19 to nasal cannula - CXR on 10/19 showed a developing RLL pneumonia; will repeat CXR again tomorrow - continue chest PT q4hrs; start IS q1hr while awake  2. Hx Asthma; Emphysema on CT; COPD  exacerbation: - EMR reports hx of asthma, but the presence of emphysema on CT and the hx of tobacco abuse makes this more likely COPD; currently in a COPD exacerbation - increase solumedrol from 40mg  to 60mg  IV q6hrs - continue Duonebs q6hrs, Pulmicort and Brovana BID - has increased work of breathing so will start BIPAP   CARDIOVASCULAR 1. Hx a.fib (on xarelto), HTN, CAD, CHF, ascending aortic aneurysm, now in Afib with RVR - Holding anticoagulation given report of hemorrhagic conversion.  Defer timing of resuming anticoagulation to neuro. - Lopressor PRN. - Needs follow up imaging in 1 year for aneurysm. - dilt gtt was stopped overnight for unclear reasons, currently in Afib at 110's; restart dilt gtt now. Once cleared to start PO meds will hopefully be able to wean off dilt gtt - Continue preadmission atorvastatin. - Hold preadmission ASA, carvedilol, xarelto, imdur.  RENAL 1. Mild hyponatremia: - appears chronic and at baseline; continue to monitor 2. CKD:  - creatinine 1.30 today which is at his baseline of 1.3-1.7 - continue to monitor UOP; avoid nephrotoxic agents - decrease NS from 75cc/hr to 10cc/hr. 3. Hypokalemia and Hypomagnesemia: - both have been repleted again today.  GASTROINTESTINAL No active issues   HEMATOLOGIC 1. Anemia: - Hgb 9.5, which is at baseline of Hgb 9-10 - continue to monitor  INFECTIOUS 1. Sepsis due to Aspiration pneumonia: - patient febrile overnight and tachycardic the night of 10/18PM-10/19AM; CXR on my review today shows new RLL infiltrate likely an aspiration pneumonia - sputum culture and blood culture penidng, UA was never sent - hx of recent admissions and is MRSA positive from nares; continue Vanc and Zosyn to treat this as an HCAP; hopefully deescalate quickly based on cultures   ENDOCRINE No active issues   NEUROLOGIC 1. Acute left MCA infarct - s/p IR revascularization.  Per IR notes, small hemorrhagic conversion post  procedure. Hx multiple strokes. - Stroke workup per neuro. - continue PT/OT, Speech, and BP management.  Family updated: Multiple family members updated at bedside.  45 minutes critical care time  Milana ObeyKathleen Hammonds, MD  [facility] Pager: [phone]  or [phone] 11/27/2016, 5:21 AM

## 2016-11-29 NOTE — Evaluation (Signed)
Speech Language Pathology Evaluation Patient Details Name: [name] MRN: [id number] DOB: Jun 26, 1926 Today's Date: 11/29/2016 Time: 1352-1400 SLP Time Calculation (min) (ACUTE ONLY): 8 min  Problem List:  Patient Active Problem List   Diagnosis Date Noted  . Cerebral thrombosis with cerebral infarction 11/27/2016  . Stroke (cerebrum) ([place]) 11/27/2016  . Acute hypoxemic respiratory failure ([place])   . Stenosis of left carotid artery   . Acute ischemic stroke ([place]) 10/18/2016  . Cerebral infarction due to stenosis of right middle cerebral artery ([place]) 10/10/2016  . CVA (cerebral vascular accident) ([place]) 07/18/2016  . Stroke-like symptoms 07/18/2016  . Stroke ([place]) 07/18/2016  . Anemia 07/18/2016  . Non-English speaking patient 07/18/2016  . COPD, group B, by GOLD 2017 classification ([place])   . Nausea, vomiting and diarrhea 10/03/2015  . CKD (chronic kidney disease), stage III ([place]) 07/27/2015  . Carotid artery disease ([place]) 07/27/2015  . Hyponatremia 06/18/2014  . Paroxysmal atrial fibrillation ([place]) 11/16/2013  . Ischemic cardiomyopathy 11/16/2013  . Headache 11/16/2013  . Chronic anticoagulation 11/16/2013  . Neck pain on left side 11/16/2013  . Chest pain at rest 11/16/2013  . Coronary artery disease 10/14/2013  . Chronic systolic heart failure ([place]) 10/14/2013  . AAA (abdominal aortic aneurysm) without rupture ([place]) 09/08/2013  . Hyperlipidemia 06/08/2013  . History of CVA (cerebrovascular accident) 05/02/2013  . PVD (peripheral vascular disease) 3.5cm AAA Aug 2014 04/29/2013  . NSTEMI - ? type 2 - Troponin 0.63 04/28/2013  . Chronic bilateral lower abdominal pain 02/17/2013  . Hypertension    Past Medical History:  Past Medical History:  Diagnosis Date  . AAA (abdominal aortic aneurysm) ([place])   . Anemia   . Asthma   . Carotid artery disease ([place])   . CHF (congestive heart failure) ([place])   . Coronary artery disease   . Depression   . Gout   . H. pylori  infection   . Hard of hearing   . Hiatal hernia   . Hyperplasia, prostate   . Hypertension   . PUD (peptic ulcer disease)   . Renal failure, acute ([place]) 11/08/2012   Past Surgical History:  Past Surgical History:  Procedure Laterality Date  . CAROTID STENT INSERTION  2015  . CAROTID STENT INSERTION N/A 05/09/2013   Procedure: CAROTID STENT INSERTION;  Surgeon: Lorretta Harp, MD;  Location: [facility] CATH LAB;  Service: Cardiovascular;  Laterality: N/A;  . ESOPHAGOGASTRODUODENOSCOPY  06/13/2008,12/31/12  . IR PERCUTANEOUS ART THROMBECTOMY/INFUSION INTRACRANIAL INC DIAG ANGIO  11/27/2016  . LEFT HEART CATHETERIZATION WITH CORONARY ANGIOGRAM N/A 05/04/2013   Procedure: LEFT HEART CATHETERIZATION WITH CORONARY ANGIOGRAM;  Surgeon: Peter M Martinique, MD;  Location: [facility] CATH LAB;  Service: Cardiovascular;  Laterality: N/A;  . RADIOLOGY WITH ANESTHESIA N/A 11/27/2016   Procedure: IR WITH ANESTHESIA;  Surgeon: Luanne Bras, MD;  Location: [place];  Service: Radiology;  Laterality: N/A;   HPI:  [name] a 83 y.o.malewith PMH  including multiple strokes in the past, A.fib on xarelto. He presented to [facility] ED 10/17 with acute aphasia and right sided weakness. Had similar symptoms with acute CVA 1 month prior. After admission pt underwent complete revascularization of occluded left MCA. Per IR notes, had small hemorrhagic conversion in left caudate and left parietal occipital lobes. Intubated from 10/17-10/19. Increased work of breathing post intubation, possibly baseline due to COPD per notes. Likely some ventilator acquired aspiration pna per notes. MBS on 10/20/16 from prior CVA admission shows swallow [facility] though dys 2 diet recommended given mild  oral deficits with missing dentition. Pt is known to have baseline aphasia from prior CVA; last SLE 07/18/16 reported persistent aphasia and resolution of dysarthria.    Assessment / Plan / Recommendation Clinical Impression  Patient presents with  decline in cognitive-linguistic function compared with most recent evaluation 07/18/16. [name] reports prior to this CVA pt was aphasic but she was able to understand and communicate with him. Today she translates during assessment of pt's cognitive-linguistic function. Pt making attempts at responses to basic biographical questions (name, daughter's name), however pt's daughter states, "I can't understand him." Pt with apparent stereotypic utterance which sounds like "Blame somebody" in his language ([place], unknown dialect). Pt follows some one-step commands, however majority of commands met with groping movements (phonation on command, cough, tongue protrusion), even with visual, tactile cues. Able to elicit some automatic speech (counting 1-4) with max visual cues/imititation. In subsequent solo attempt, pt counts 1, 2. When asked his daughter's name, he perseverated "2." SLP will follow acutely for pt/family education and to maximize pt's communication for decreased burden of care.     SLP Assessment  SLP Recommendation/Assessment: Patient needs continued Speech Lanaguage Pathology Services SLP Visit Diagnosis: Aphasia (R47.01);Apraxia (R48.2)    Follow Up Recommendations  Other (comment) (TBD)    Frequency and Duration min 2x/week  2 weeks      SLP Evaluation Cognition  Overall Cognitive Status: Impaired/Different from baseline (difficult to assess) Arousal/Alertness: Awake/alert Orientation Level:  (pt unable to state due to aphasia) Attention: Focused;Sustained Focused Attention: Appears intact Sustained Attention: Appears intact       Comprehension  Auditory Comprehension Overall Auditory Comprehension: Other (comment) (difficult to assess due to language barrier, ?apraxia) Commands: Impaired One Step Basic Commands: 25-49% accurate Conversation: Simple Interfering Components: Data processing manager Discrimination: Not tested Reading  Comprehension Reading Status: Not tested    Expression Expression Primary Mode of Expression: Verbal Verbal Expression Overall Verbal Expression: Impaired Automatic Speech: Counting (unable to state name, counts 1-4 with repetition, visual cue) Level of Generative/Spontaneous Verbalization: Phrase Repetition: Impaired Level of Impairment: Word level Naming: Impairment Confrontation: Impaired Verbal Errors: Other (comment);Perseveration (Perseverates numbers when asked to name his daughter) Pragmatics: No impairment Non-Verbal Means of Communication: Gestures Written Expression Dominant Hand: Right Written Expression: Not tested   Oral / Motor  Oral Motor/Sensory Function Overall Oral Motor/Sensory Function:  (limited; ? apraxia) Facial ROM:  (limited, ?apraxic) Facial Strength: Reduced left Motor Speech Overall Motor Speech: Impaired Respiration: Impaired Level of Impairment: Word Phonation: Low vocal intensity;Hoarse Resonance: Within functional limits Articulation:  (difficult to assess) Intelligibility: Intelligibility reduced Word:  (daughter reports difficulty understanding words, phrases) Motor Planning: Impaired Level of Impairment: Word Motor Speech Errors: Groping for words Interfering Components:  (language barrier)   [place], [place], [place] Pathologist [phone]         Aliene Altes 11/29/2016, 2:48 PM

## 2016-11-29 NOTE — Progress Notes (Signed)
STROKE TEAM PROGRESS NOTE   SUBJECTIVE (INTERVAL HISTORY) Daughter is at bedside as interpretor. Pt is awak alert but SOB with wheezing. Daughter reported pt expressive aphasia but able to understanding some from daughter. He was put on BiPAP for SOB. Moving all extremities. Mild left gaze preference.  OBJECTIVE Temp:  [98 F (36.7 C)-100.1 F (37.8 C)] 98.1 F (36.7 C) (10/20 0400) Pulse Rate:  [61-133] 61 (10/20 0600) Cardiac Rhythm: Atrial fibrillation (10/19 2000) Resp:  [17-32] 18 (10/20 0600) BP: (107-156)/(52-122) 124/55 (10/20 0600) SpO2:  [92 %-100 %] 92 % (10/20 0600) FiO2 (%):  [40 %] 40 % (10/19 1000)  CBC:   Recent Labs Lab 11/28/16 0442 11/29/16 0419  WBC 11.9* 10.1  NEUTROABS 10.5* 9.7*  HGB 8.3* 9.5*  HCT 25.2* 28.2*  MCV 94.0 91.9  PLT 229 182    Basic Metabolic Panel:   Recent Labs Lab 11/28/16 0442 11/29/16 0419  NA 133* 131*  K 3.5 3.4*  CL 104 104  CO2 18* 18*  GLUCOSE 130* 161*  BUN 9 13  CREATININE 1.46* 1.30*  CALCIUM 8.0* 8.3*  MG 1.4* 1.8  PHOS 3.1 3.1    Lipid Panel:     Component Value Date/Time   CHOL 89 11/28/2016 0442   CHOL 122 09/09/2013 0815   TRIG 102 11/28/2016 0442   HDL 28 (L) 11/28/2016 0442   HDL 35 (L) 09/09/2013 0815   CHOLHDL 3.2 11/28/2016 0442   VLDL 20 11/28/2016 0442   LDLCALC 41 11/28/2016 0442   LDLCALC 50 09/09/2013 0815   HgbA1c:  Lab Results  Component Value Date   HGBA1C 6.0 (H) 11/28/2016   Urine Drug Screen:     Component Value Date/Time   LABOPIA NONE DETECTED 10/08/2016 1937   COCAINSCRNUR NONE DETECTED 10/08/2016 1937   LABBENZ NONE DETECTED 10/08/2016 1937   AMPHETMU NONE DETECTED 10/08/2016 1937   THCU NONE DETECTED 10/08/2016 1937   LABBARB NONE DETECTED 10/08/2016 1937    Alcohol Level     Component Value Date/Time   ETH <5 10/09/2016 0100    IMAGING I have personally reviewed the radiological images below and agree with the radiology interpretations.  Ct Angio Head W Or  Wo Contrast Ct Angio Neck W Or Wo Contrast Ct Cerebral Perfusion W Contrast 11/27/2016 1. Left middle cerebral artery proximal M2 inferior division occlusion. Poor collateral circulation in the left posterior MCA distribution. 2. CT perfusion demonstrating left posterior MCA distribution core infarct with volume of 48 cc and ischemic penumbra of 81 cc by automated RAPID post processing. 3. Patent carotid and vertebral arteries of the neck. No high-grade stenosis by NASCET criteria, aneurysm, or dissection. 4. Stented left distal common carotid artery to proximal left internal carotid artery. The stent appears widely patent. 5. 4 mm rounded dilation of left posterior communicating artery ICA origin compatible with aneurysm. 6. Left V4 segment focal ectasia measuring 6 mm with ulcerated mural plaque. 7. 4.2 cm ascending aortic aneurysm. Recommend annual imaging followup by CTA or MRA. This recommendation follows 2010 ACCF/AHA/AATS/ACR/ASA/SCA/SCAI/SIR/STS/SVM Guidelines for the Diagnosis and Management of Patients with Thoracic Aortic Disease. Circulation. 2010; 121: K599-J570. 8. Moderate to severe mixed plaque of the aorta with multiple plaque ulcerations.  Ct Head Wo Contrast 11/26/2016 1. New foci of hypoattenuation within the left parietal cortex and left caudate head compatible with interval late acute/early subacute infarction from 10/21/2016. Subcentimeter focus of hemorrhage within the new left caudate head infarction. No significant mass effect. 2. Hemorrhagic conversion of left  parietooccipital infarction. No significant mass effect or extra-axial collection. 3. Multiple chronic infarcts as described are stable. 4. Hyperdense distal left MCA in the MCA cistern may represent thrombus. 11/27/2016 1. Enhancement of left caudate head, scattered foci of the left frontal parietal lobes, and left lateral temporal lobe areas of acute infarction correspond to infarct core on the prior CT  perfusion. No new area of acute infarction identified at this time. No significant mass effect. 2. Increased density within the left temporoparietal lobe hemorrhagic infarction probably represents superimposed enhancement. Evaluation for new hemorrhage is limited in the presence of contrast. 3. Soft tissue thickening of right external auditory canal, correlate for possible otitis externa. No bony destructive changes. 11/27/2016 1. Increased subarachnoid contrast/hemorrhage in the lower left sylvian fissure and perimesencephalic cistern, overall small volume. 2. Contrast staining in the infarcted left temporoparietal cortex after recent procedure. 3. Petechial hemorrhage in subacute infarcts in the left caudate head and left parietooccipital cortex as seen on admission noncontrast head CT. 4. Moderate acute lateral left frontal infarct.  Mr Brain Wo Contrast 11/27/2016 1. Acute left lateral frontal lobe infarct. 2. Extensive subacute and chronic ischemia as above, including a large subacute left temporal lobe infarct which is new from 10/18/2016. Associated petechial hemorrhage. 3. Small volume subarachnoid hemorrhage in the left perimesencephalic cistern.  Portable Chest Xray 11/27/2016 1. Endotracheal tube tip noted 2.7 cm above the carina. 2. Cardiomegaly with normal pulmonary vascularity. 3. Low lung volumes with mild bilateral atelectatic changes . 11/28/2016 1. Support apparatus, as above. 2. New ill-defined opacities in the base of the right lung concerning for developing airspace consolidation, favored to be in the right lower lobe. 3. Probable small right pleural effusion. 4. Aortic atherosclerosis.   PHYSICAL EXAM  Temp:  [98 F (36.7 C)-100.1 F (37.8 C)] 98.1 F (36.7 C) (10/20 0400) Pulse Rate:  [61-133] 61 (10/20 0600) Resp:  [17-32] 18 (10/20 0600) BP: (107-156)/(52-122) 124/55 (10/20 0600) SpO2:  [92 %-100 %] 92 % (10/20 0600) FiO2 (%):  [40 %] 40 % (10/19  1000)  General - awake, alert, following pantomime but not verbal commands likely language barrier  Cardiovascular - Irregular rhythm with normal rate.  Mental Status -  level of consciousness awake and moving  Cranial Nerves II - XII - II - Visual field intact to threats b/l. III, IV, VI - Extraocular movements intact, mild left gaze preference and mild right neglect VII - Facial movement intact bilaterally. VIII - Hearing grossly intact XII - Tongue protrusion intact.  Motor Strength - Strength was symmetrical BUEs. LLE 5/5. RLE 2/5, not against gravity drift to bed immediately.  Motor Tone - Muscle tone was normal  Reflexes - The patient's reflexes were 1+ with negative babinski.  Sensory - Not assessed  Coordination - No appreciable dysmetria or tremor but limited exam.  Gait and Station - Not assessed   ASSESSMENT/PLAN [name] is a 83 y.o. male with history of multiple strokes, atrial fibrillation, hypertension, CAD, CHF presenting with aphasia and right sided weakness. He did not receive IV t-PA due to anticoagulation but underwent endovascular clot retraction.   Stroke:  left MCA embolic secondary to proximal M2  Occlusion s/p thrombectomy with TICI3 revascularization  Resultant  RLE weakness, aphasia  CT head New foci of hypoattenuation within the left parietal cortex and left caudate head compatible with interval late acute/early subacute infarction from 10/21/2016.  MRI head Acute left lateral frontal lobe infarct with associated petechial hemorrhage.  CTA head Left  middle cerebral artery proximal M2 inferior division occlusion.  CTA Neck stented left distal common carotid artery to proximal left internal carotid artery  IR - left ICA stent only 30% stenosis  2D Echo  From 10/19/2016 LVEF 50-55%, no embolic source  LDL 41  HgbA1c 6.0%  SCDs for VTE prophylaxis Diet NPO time specified  Xarelto (rivaroxaban) daily prior to admission, now on  No antithrombotic. No AC at this time due to risk of hemorrhagic transformation. Within 7-10 days, if pt stable and no planned procedure, will consider to start coumadin with INR goal 2.5-3.5 to help for stroke prevention.   Ongoing aggressive stroke risk factor management  Therapy recommendations:  Pending  Disposition:  Pending  Hx of embolic strokes    10/18/16 acute left MCA infarct, left thalamic and left ACA/PCA at high convexity infarcts - CTA head and neck left ICA stent 70% stenosis and right M1 high grade stenosis. EF 50-55% improved. LDL 45 and A1C 6.0. His AC was on hold during admission but later changed to Xarelto and ASA as outpt.  10/09/16 acute right PLIC infarct - small vessel disease - right M1 75% stenosis - EF 45-50% - LDL 50 and A1C 6.4 - continued on pradaxa and ASA  07/2016 - right MCA infarct - right M1 75% stenosis, left ICA stent 70% stenosis - EF 45-50% - LDL 42 and A1C 6.4 - Xarelto changed to pradaxa  04/2013 - multifocal cardioembolic infarcts - new diagnosed afib - EF 35% - put on eliquis 2.5mg  bid in 08/2013 - however, pt was on Xarelto 10/2013 as per chart  Respiratory distress  Extubated  CXR showed pneumonia - on vanco and zosyn  SOB and wheezing  On BiPAP  CCM on board\  Afib, chronic  Rate controlled  Persistent   On cardizem  Hypertension  Stable  BP goal to <180 SBP  Long-term BP goal normotensive <140/90  Hyperlipidemia  Home meds:  Atorvastatin 40mg , resumed in hospital  LDL 41, goal < 70  Continue statin once po access  Other Stroke Risk Factors  Advanced age   Coronary artery disease  Other active problems  AKI - Cre 1.46->1.30  [facility]   Hospital day # 2  This patient is critically ill due to recurrent embolic strokes, afib, s/p IR, SOB wheezing, penumonia and at significant risk of neurological worsening, death form recurrent stroke, hemorrhagic conversion, heart failure, respiratory  failure. This patient's care requires constant monitoring of vital signs, hemodynamics, respiratory and cardiac monitoring, review of multiple databases, neurological assessment, discussion with family, other specialists and medical decision making of high complexity. I had long discussion with daughter and wife at bedside, updated pt current condition, treatment plan and potential prognosis. They expressed understanding and appreciation. I spent 45 minutes of neurocritical care time in the care of this patient.  Marvel PlanJindong Yamili Lichtenwalner, MD PhD Stroke Neurology 11/29/2016 11:19 AM   To contact Stroke Continuity provider, please refer to [email or web address]. After hours, contact General Neurology

## 2016-11-29 NOTE — Progress Notes (Signed)
SLP Cancellation Note  Patient Details Name: Yonis Riehl MRN: [id number] DOB: Sep 18, 1926   Cancelled treatment:       Reason Eval/Treat Not Completed: Medical issues which prohibited therapy. Pt on BiPAP, though RN and MD hopeful for SLP assessment as soon as possible. Will follow for readiness. Can reach the weekend SLP at [phone].    Shannelle Alguire, Riley Nearing 11/29/2016, 10:06 AM

## 2016-11-29 NOTE — Progress Notes (Signed)
Pt calling trying to talk, RR increased.  Pt taken off Bipap to rest on [place] 4 L with humidity.  Pt with expiratory wheezes more audible off Bipap.  PRN neb treatment given.  RN notified.

## 2016-11-29 NOTE — Progress Notes (Signed)
CPT not done at this time. Pt resting comfortably on Bipap.

## 2016-11-30 ENCOUNTER — Inpatient Hospital Stay (HOSPITAL_COMMUNITY): Payer: Medicare Other

## 2016-11-30 DIAGNOSIS — G9340 Encephalopathy, unspecified: Secondary | ICD-10-CM

## 2016-11-30 DIAGNOSIS — J690 Pneumonitis due to inhalation of food and vomit: Secondary | ICD-10-CM

## 2016-11-30 DIAGNOSIS — I481 Persistent atrial fibrillation: Secondary | ICD-10-CM

## 2016-11-30 LAB — BASIC METABOLIC PANEL
ANION GAP: 13 (ref 5–15)
BUN: 20 mg/dL (ref 6–20)
CHLORIDE: 104 mmol/L (ref 101–111)
CO2: 16 mmol/L — ABNORMAL LOW (ref 22–32)
Calcium: 8.9 mg/dL (ref 8.9–10.3)
Creatinine, Ser: 1.46 mg/dL — ABNORMAL HIGH (ref 0.61–1.24)
GFR calc non Af Amer: 41 mL/min — ABNORMAL LOW (ref >60)
GFR, EST AFRICAN AMERICAN: 48 mL/min — AB (ref >60)
Glucose, Bld: 162 mg/dL — ABNORMAL HIGH (ref 65–99)
POTASSIUM: 3.9 mmol/L (ref 3.5–5.1)
SODIUM: 133 mmol/L — AB (ref 135–145)

## 2016-11-30 LAB — CBC WITH DIFFERENTIAL/PLATELET
Basophils Absolute: 0 10*3/uL (ref 0.0–0.1)
Basophils Relative: 0 %
EOS PCT: 0 %
Eosinophils Absolute: 0 10*3/uL (ref 0.0–0.7)
HEMATOCRIT: 27 % — AB (ref 39.0–52.0)
Hemoglobin: 9.1 g/dL — ABNORMAL LOW (ref 13.0–17.0)
LYMPHS PCT: 3 %
Lymphs Abs: 0.4 10*3/uL — ABNORMAL LOW (ref 0.7–4.0)
MCH: 30.8 pg (ref 26.0–34.0)
MCHC: 33.7 g/dL (ref 30.0–36.0)
MCV: 91.5 fL (ref 78.0–100.0)
MONO ABS: 0.1 10*3/uL (ref 0.1–1.0)
MONOS PCT: 1 %
NEUTROS ABS: 12.8 10*3/uL — AB (ref 1.7–7.7)
Neutrophils Relative %: 96 %
Platelets: 184 10*3/uL (ref 150–400)
RBC: 2.95 MIL/uL — ABNORMAL LOW (ref 4.22–5.81)
RDW: 15.3 % (ref 11.5–15.5)
WBC: 13.3 10*3/uL — ABNORMAL HIGH (ref 4.0–10.5)

## 2016-11-30 LAB — GLUCOSE, CAPILLARY: Glucose-Capillary: 178 mg/dL — ABNORMAL HIGH (ref 65–99)

## 2016-11-30 LAB — MAGNESIUM: Magnesium: 1.9 mg/dL (ref 1.7–2.4)

## 2016-11-30 LAB — CULTURE, RESPIRATORY: CULTURE: NORMAL

## 2016-11-30 LAB — PHOSPHORUS: Phosphorus: 2.8 mg/dL (ref 2.5–4.6)

## 2016-11-30 LAB — CULTURE, RESPIRATORY W GRAM STAIN

## 2016-11-30 LAB — TRIGLYCERIDES: Triglycerides: 52 mg/dL (ref <150)

## 2016-11-30 MED ORDER — HYDROCODONE-ACETAMINOPHEN 5-325 MG PO TABS
1.0000 | ORAL_TABLET | Freq: Four times a day (QID) | ORAL | Status: DC | PRN
  Administered 2016-11-30 (×2): 1 via ORAL
  Filled 2016-11-30 (×2): qty 1

## 2016-11-30 MED ORDER — FUROSEMIDE 10 MG/ML IJ SOLN
40.0000 mg | Freq: Once | INTRAMUSCULAR | Status: AC
  Administered 2016-11-30: 40 mg via INTRAVENOUS
  Filled 2016-11-30: qty 4

## 2016-11-30 NOTE — Progress Notes (Signed)
STROKE TEAM PROGRESS NOTE   SUBJECTIVE (INTERVAL HISTORY) RN is at bedside, no family seen this am. Pt is awak alert, able to have sip of water for me. Still has SOB with wheezing, but HR much improved with cardizem. On face mask. RLE strength improved.  OBJECTIVE Temp:  [98.3 F (36.8 C)-99 F (37.2 C)] 98.3 F (36.8 C) (10/21 0400) Pulse Rate:  [64-106] 64 (10/21 0700) Cardiac Rhythm: Atrial fibrillation (10/21 0400) Resp:  [15-26] 23 (10/21 0700) BP: (111-160)/(58-120) 121/98 (10/21 0700) SpO2:  [90 %-100 %] 95 % (10/21 0700) FiO2 (%):  [30 %] 30 % (10/20 2000)  CBC:   Recent Labs Lab 11/29/16 0419 11/30/16 0328  WBC 10.1 13.3*  NEUTROABS 9.7* 12.8*  HGB 9.5* 9.1*  HCT 28.2* 27.0*  MCV 91.9 91.5  PLT 182 184    Basic Metabolic Panel:   Recent Labs Lab 11/29/16 0419 11/30/16 0328  NA 131* 133*  K 3.4* 3.9  CL 104 104  CO2 18* 16*  GLUCOSE 161* 162*  BUN 13 20  CREATININE 1.30* 1.46*  CALCIUM 8.3* 8.9  MG 1.8 1.9  PHOS 3.1 2.8    Lipid Panel:     Component Value Date/Time   CHOL 89 11/28/2016 0442   CHOL 122 09/09/2013 0815   TRIG 52 11/30/2016 0515   HDL 28 (L) 11/28/2016 0442   HDL 35 (L) 09/09/2013 0815   CHOLHDL 3.2 11/28/2016 0442   VLDL 20 11/28/2016 0442   LDLCALC 41 11/28/2016 0442   LDLCALC 50 09/09/2013 0815   HgbA1c:  Lab Results  Component Value Date   HGBA1C 6.0 (H) 11/28/2016   Urine Drug Screen:     Component Value Date/Time   LABOPIA NONE DETECTED 10/08/2016 1937   COCAINSCRNUR NONE DETECTED 10/08/2016 1937   LABBENZ NONE DETECTED 10/08/2016 1937   AMPHETMU NONE DETECTED 10/08/2016 1937   THCU NONE DETECTED 10/08/2016 1937   LABBARB NONE DETECTED 10/08/2016 1937    Alcohol Level     Component Value Date/Time   ETH <5 10/09/2016 0100    IMAGING I have personally reviewed the radiological images below and agree with the radiology interpretations.  Ct Angio Head W Or Wo Contrast Ct Angio Neck W Or Wo Contrast Ct  Cerebral Perfusion W Contrast 11/27/2016 1. Left middle cerebral artery proximal M2 inferior division occlusion. Poor collateral circulation in the left posterior MCA distribution. 2. CT perfusion demonstrating left posterior MCA distribution core infarct with volume of 48 cc and ischemic penumbra of 81 cc by automated RAPID post processing. 3. Patent carotid and vertebral arteries of the neck. No high-grade stenosis by NASCET criteria, aneurysm, or dissection. 4. Stented left distal common carotid artery to proximal left internal carotid artery. The stent appears widely patent. 5. 4 mm rounded dilation of left posterior communicating artery ICA origin compatible with aneurysm. 6. Left V4 segment focal ectasia measuring 6 mm with ulcerated mural plaque. 7. 4.2 cm ascending aortic aneurysm. Recommend annual imaging followup by CTA or MRA. This recommendation follows 2010 ACCF/AHA/AATS/ACR/ASA/SCA/SCAI/SIR/STS/SVM Guidelines for the Diagnosis and Management of Patients with Thoracic Aortic Disease. Circulation. 2010; 121: [id number]. 8. Moderate to severe mixed plaque of the aorta with multiple plaque ulcerations.  Ct Head Wo Contrast 11/26/2016 1. New foci of hypoattenuation within the left parietal cortex and left caudate head compatible with interval late acute/early subacute infarction from 10/21/2016. Subcentimeter focus of hemorrhage within the new left caudate head infarction. No significant mass effect. 2. Hemorrhagic conversion of left parietooccipital infarction.  No significant mass effect or extra-axial collection. 3. Multiple chronic infarcts as described are stable. 4. Hyperdense distal left MCA in the MCA cistern may represent thrombus. 11/27/2016 1. Enhancement of left caudate head, scattered foci of the left frontal parietal lobes, and left lateral temporal lobe areas of acute infarction correspond to infarct core on the prior CT perfusion. No new area of acute infarction identified at  this time. No significant mass effect. 2. Increased density within the left temporoparietal lobe hemorrhagic infarction probably represents superimposed enhancement. Evaluation for new hemorrhage is limited in the presence of contrast. 3. Soft tissue thickening of right external auditory canal, correlate for possible otitis externa. No bony destructive changes. 11/27/2016 1. Increased subarachnoid contrast/hemorrhage in the lower left sylvian fissure and perimesencephalic cistern, overall small volume. 2. Contrast staining in the infarcted left temporoparietal cortex after recent procedure. 3. Petechial hemorrhage in subacute infarcts in the left caudate head and left parietooccipital cortex as seen on admission noncontrast head CT. 4. Moderate acute lateral left frontal infarct.  Mr Brain Wo Contrast 11/27/2016 1. Acute left lateral frontal lobe infarct. 2. Extensive subacute and chronic ischemia as above, including a large subacute left temporal lobe infarct which is new from 10/18/2016. Associated petechial hemorrhage. 3. Small volume subarachnoid hemorrhage in the left perimesencephalic cistern.  Portable Chest Xray 11/27/2016 1. Endotracheal tube tip noted 2.7 cm above the carina. 2. Cardiomegaly with normal pulmonary vascularity. 3. Low lung volumes with mild bilateral atelectatic changes . 11/28/2016 1. Support apparatus, as above. 2. New ill-defined opacities in the base of the right lung concerning for developing airspace consolidation, favored to be in the right lower lobe. 3. Probable small right pleural effusion. 4. Aortic atherosclerosis. 11/30/16 pending   PHYSICAL EXAM  Temp:  [98.3 F (36.8 C)-99 F (37.2 C)] 98.3 F (36.8 C) (10/21 0400) Pulse Rate:  [64-106] 64 (10/21 0700) Resp:  [15-26] 23 (10/21 0700) BP: (111-160)/(58-120) 121/98 (10/21 0700) SpO2:  [90 %-100 %] 95 % (10/21 0700) FiO2 (%):  [30 %] 30 % (10/20 2000)  General - awake, alert, following  pantomime but not verbal commands likely language barrier  Cardiovascular - Irregular rhythm with normal rate.  Mental Status -  level of consciousness awake and moving  Cranial Nerves II - XII - II - Visual field intact to threats b/l. III, IV, VI - Extraocular movements intact, mild left gaze preference and mild right neglect VII - Facial movement intact bilaterally. VIII - Hearing grossly intact XII - Tongue protrusion intact.  Motor Strength - Strength was symmetrical BUEs. LLE 5/5. RLE 4-/5, able to against gravity with no significant drift.  Motor Tone - Muscle tone was normal  Reflexes - The patient's reflexes were 1+ with negative babinski.  Sensory - Not assessed  Coordination - No appreciable dysmetria or tremor but limited exam.  Gait and Station - Not assessed   ASSESSMENT/PLAN [name] is a 83 y.o. male with history of multiple strokes, atrial fibrillation, hypertension, CAD, CHF presenting with aphasia and right sided weakness. He did not receive IV t-PA due to anticoagulation but underwent endovascular clot retraction.   Stroke:  left MCA embolic secondary to proximal M2  Occlusion s/p thrombectomy with TICI3 revascularization  Resultant  RLE weakness, aphasia  CT head New foci of hypoattenuation within the left parietal cortex and left caudate head compatible with interval late acute/early subacute infarction from 10/21/2016.  MRI head Acute left lateral frontal lobe infarct with associated petechial hemorrhage.  CTA head  Left middle cerebral artery proximal M2 inferior division occlusion.  CTA Neck stented left distal common carotid artery to proximal left internal carotid artery  IR - s/p left M2 revascularization with TICI3, left ICA stent only 30% stenosis  2D Echo  From 10/19/2016 LVEF 50-55%, no embolic source  LDL 41  HgbA1c 6.0%  SCDs for VTE prophylaxis Diet NPO time specified  Xarelto (rivaroxaban) daily prior to admission,  now on No antithrombotic. No AC at this time due to risk of hemorrhagic transformation. Within 7-10 days, if pt stable and no planned procedure, will consider to start coumadin with INR goal 2.5-3.5 to help for stroke prevention.   Ongoing aggressive stroke risk factor management  Therapy recommendations:  Pending  Disposition:  Pending  Hx of embolic strokes    10/18/16 acute left MCA infarct, left thalamic and left ACA/PCA at high convexity infarcts - CTA head and neck left ICA stent 70% stenosis and right M1 high grade stenosis. EF 50-55% improved. LDL 45 and A1C 6.0. His AC was on hold during admission but later changed to Xarelto and ASA as outpt.  10/09/16 acute right PLIC infarct - small vessel disease - right M1 75% stenosis - EF 45-50% - LDL 50 and A1C 6.4 - continued on pradaxa and ASA  07/2016 - right MCA infarct - right M1 75% stenosis, left ICA stent 70% stenosis - EF 45-50% - LDL 42 and A1C 6.4 - Xarelto changed to pradaxa  04/2013 - multifocal cardioembolic infarcts - new diagnosed afib - EF 35% - put on eliquis 2.5mg  bid in 08/2013 - however, pt was on Xarelto 10/2013 as per chart  Respiratory distress  Extubated  CXR showed possible pneumonia - on vanco and zosyn  SOB and wheezing  Off BiPAP, on face mask  On solumedrol, CPT, duo nebs  CCM on board  CXR repeat today pending  Afib, chronic  Rate controlled  Persistent   On cardizem  Dysphagia   Did not pass swallow due to respiratory issue and aphasia  However, able to swallow sips of water without problem with me this am  Speech to follow  Appreciate speech assistance  Hypertension  Stable  BP goal to <180 SBP  Long-term BP goal normotensive <140/90  Hyperlipidemia  Home meds:  Atorvastatin 40mg , resumed in hospital  LDL 41, goal < 70  Continue statin once po access  Other Stroke Risk Factors  Advanced age   Coronary artery disease  Other active problems  AKI - Cre  1.46->1.30->1.46  Follows up with [facility]. Pearlean Brownie at [facility] 3  This patient is critically ill due to recurrent embolic strokes, afib, s/p IR, SOB wheezing, penumonia and at significant risk of neurological worsening, death form recurrent stroke, hemorrhagic conversion, heart failure, respiratory failure. This patient's care requires constant monitoring of vital signs, hemodynamics, respiratory and cardiac monitoring, review of multiple databases, neurological assessment, discussion with family, other specialists and medical decision making of high complexity. I spent 35 minutes of neurocritical care time in the care of this patient.  Marvel Plan, MD PhD Stroke Neurology 11/30/2016 10:22 AM   To contact Stroke Continuity provider, please refer to [email or web address]. After hours, contact General Neurology

## 2016-11-30 NOTE — Progress Notes (Addendum)
  Speech Language Pathology Treatment: Dysphagia  Patient Details Name: [name] MRN: [id number] DOB: 04/02/1926 Today's Date: 11/30/2016 Time: 2574-9355 SLP Time Calculation (min) (ACUTE ONLY): 15 min  Assessment / Plan / Recommendation Clinical Impression  Patient seen for follow-up for dysphagia to reassess readiness for POs, instrumental study. Pt is more alert today; daughter is again present to translate. Pt off bipap for the past 2 hours; WOB remains effortful, respiratory rate 18-22. Oral care performed prior to providing diagnostic trials of ice chips, thin liquids, purees. With ice chips, thin liquids via teaspoon, cup and straw, pt exhibits no overt signs of aspiration. Swallow appears timely, vitals are stable with no coughing or increased WOB. Some mild left anterior loss, although this is consistent with his prior level of function. Would not recommend full meals at this stage given pt's tenuous respiratory status, but discussed with RN, daughter re: allowing pt to have small snacks of pureed solids, sips of thin liquids when stable off bipap. Can give meds crushed in puree. Educated daughter re: feeding only when alert, breathing is stable, small bites and sips at a slow rate. Will follow up for tolerance, possible instrumental assessment.     HPI HPI: [name] a 83 y.o.malewith PMH  including multiple strokes in the past, A.fib on xarelto. He presented to [facility] ED 10/17 with acute aphasia and right sided weakness. Had similar symptoms with acute CVA 1 month prior. After admission pt underwent complete revascularization of occluded left MCA. Per IR notes, had small hemorrhagic conversion in left caudate and left parietal occipital lobes. Intubated from 10/17-10/19. Increased work of breathing post intubation, possibly baseline due to COPD per notes. Likely some ventilator acquired aspiration pna per notes. MBS on 10/20/16 from prior CVA admission shows swallow [facility]  though dys 2 diet recommended given mild oral deficits with missing dentition. Pt is known to have baseline aphasia from prior CVA; last SLE 07/18/16 reported persistent aphasia and resolution of dysarthria.       SLP Plan  Continue with current plan of care       Recommendations  Diet recommendations: Dysphagia 1 (puree);Thin liquid Liquids provided via: Teaspoon;Straw Medication Administration: Crushed with puree Supervision: Staff to assist with self feeding;Full supervision/cueing for compensatory strategies Compensations: Slow rate;Small sips/bites;Monitor for anterior loss Postural Changes and/or Swallow Maneuvers: Seated upright 90 degrees                Oral Care Recommendations: Oral care before and after PO Follow up Recommendations: Other (comment) (TBD) SLP Visit Diagnosis: Dysphagia, unspecified (R13.10) Plan: Continue with current plan of care       GO               Rondel Baton, MS, CCC-SLP Speech-Language Pathologist [phone]  Arlana Lindau 11/30/2016, 2:50 PM

## 2016-11-30 NOTE — Progress Notes (Signed)
CPT not done at this time. Pt is sleeping. 

## 2016-11-30 NOTE — Progress Notes (Signed)
SLP Cancellation Note  Patient Details Name: [name] MRN: [id number] DOB: 10-02-1926   Cancelled treatment:       Reason Eval/Treat Not Completed: Medical issues which prohibited therapy. Attempted to follow-up with patient for dysphagia at request of Dr. Roda Shutters. Pt on bipap at this time. RN to page if pt ready for re-assessment.  Rondel Baton, [place], CCC-SLP Speech-Language Pathologist [phone]   Arlana Lindau 11/30/2016, 10:18 AM

## 2016-11-30 NOTE — Progress Notes (Signed)
PULMONARY / CRITICAL CARE MEDICINE   Name: [name] MRN: [id number] DOB: 09-22-1926    ADMISSION DATE:  11/26/2016 CONSULTATION DATE:  11/27/16  REFERRING MD:  Otelia Limes  CHIEF COMPLAINT:  Aphasia  HISTORY OF PRESENT ILLNESS:  Pt is encephelopathic; therefore, this HPI is obtained from chart review. [name] is a 83 y.o. male with PMH as outlined below including multiple strokes in the past, A.fib on xarelto.  He presented to [facility] ED 10/17 with acute aphasia and right sided weakness.  Had similar symptoms with acute CVA 1 month prior.  In ED, CT demonstrated dense left M2 segment.  He was taken to IR where he had left common carotid arteriogram followed by complete revascularization of occluded left MCA.  Per IR notes, had small hemorrhagic conversion in left caudate and left parietal occipital.  He returned to the ICU on the ventilator and PCCM was asked to see for vent management.  REVIEW OF SYSTEMS:   Review of Systems  Unable to perform ROS: Critical illness   SUBJECTIVE:  Remains on BiPAP, failed swallow evaluation (not sure its a language issue or CVA issue)  VITAL SIGNS: BP 140/68   Pulse 87   Temp 98.3 F (36.8 C) (Core (Comment))   Resp (!) 21   Ht 5\' 5"  (1.651 m)   Wt 63.9 kg (140 lb 14 oz)   SpO2 99%   BMI 23.44 kg/m   VENTILATOR SETTINGS: FiO2 (%):  [30 %] 30 %  INTAKE / OUTPUT: I/O last 3 completed shifts: In: 1278.3 [I.V.:478.3; IV Piggyback:800] Out: 1825 [Urine:1825]  PHYSICAL EXAMINATION: General: Chronically ill appearing male, aphasic Neuro: Awake but not following commands Cardiovascular: RRR, Nl S1/S2, -M/R/G Lungs: Visibly mildly distressed from a respiratory standpoint, lungs are quite. Abdomen:  Soft, NT, ND and +BS Musculoskeletal: no LE edema; missing digit on right hand   Skin: no rashes   LABS:  BMET  Recent Labs Lab 11/28/16 0442 11/29/16 0419 11/30/16 0328  NA 133* 131* 133*  K 3.5 3.4* 3.9  CL 104 104  104  CO2 18* 18* 16*  BUN 9 13 20   CREATININE 1.46* 1.30* 1.46*  GLUCOSE 130* 161* 162*   Electrolytes  Recent Labs Lab 11/28/16 0442 11/29/16 0419 11/30/16 0328  CALCIUM 8.0* 8.3* 8.9  MG 1.4* 1.8 1.9  PHOS 3.1 3.1 2.8   CBC  Recent Labs Lab 11/28/16 0442 11/29/16 0419 11/30/16 0328  WBC 11.9* 10.1 13.3*  HGB 8.3* 9.5* 9.1*  HCT 25.2* 28.2* 27.0*  PLT 229 182 184   Coag's  Recent Labs Lab 11/26/16 2256 11/27/16 1343  APTT 70*  --   INR 2.75 1.67   Sepsis Markers No results for input(s): LATICACIDVEN, PROCALCITON, O2SATVEN in the last 168 hours.  ABG  Recent Labs Lab 11/27/16 0523 11/28/16 0324 11/28/16 1048  PHART 7.269* 7.373 7.377  PCO2ART 48.2* 31.7* 25.4*  PO2ART 432.0* 124* 117.0*   Liver Enzymes  Recent Labs Lab 11/26/16 2256  AST 28  ALT 16*  ALKPHOS 76  BILITOT 1.0  ALBUMIN 3.3*    Cardiac Enzymes No results for input(s): TROPONINI, PROBNP in the last 168 hours.  Glucose No results for input(s): GLUCAP in the last 168 hours.  Imaging No results found. STUDIES:  CT cerebral perfusion 10/18 > left M2 occlusion, left posterior MCA core infarct. 4.2cm ascending aortic aneurysm.  SIGNIFICANT EVENTS: 10/18 > admit, to IR for revascularization. 10/19: fever overnight, pancultured, started antibiotics  LINES/TUBES: ETT 10/18 > 10/19 R femoral  sheath 10/18 >10/18 PM 2-PIV's   DISCUSSION: 83 y.o. male with hx A.fib on xarelto and multiple strokes, admitted 10/18 with left MCA infarct.  Taken to IR for revascularization then returned to ICU on vent. Per IR notes, had small hemorrhagic conversion in left caudate and left parietal occipital.  ASSESSMENT / PLAN:  PULMONARY 1. Acute Hypoxic respiratory failure; Aspiration Pneumonia: - Extubated 10/19 to nasal cannula but airway protection remains a concern - If continues to need BiPAP and fail swallow evaluation may need intubation - CXR 10/21 showed infiltrate consistent  with PNA, vanc/zosyn. - Continue chest PT q4hrs; start IS q1hr while awake - Swallow evaluation today  2. Hx Asthma; Emphysema on CT; COPD exacerbation: - EMR reports hx of asthma, but the presence of emphysema on CT and the hx of tobacco abuse makes this more likely COPD; currently in a COPD exacerbation - Increased solumedrol from 40mg  to 60mg  IV q6hrs, will continue for now - Continue Duonebs q6hrs, Pulmicort and Brovana BID  CARDIOVASCULAR 1. Hx a.fib (on xarelto), HTN, CAD, CHF, ascending aortic aneurysm, now in Afib with RVR - Holding anticoagulation given report of hemorrhagic conversion.  Defer timing of resuming anticoagulation to neuro. - Lopressor PRN. - Needs follow up imaging in 1 year for aneurysm. - Dilt gtt restarted overnight, when able to take PO will change to PO - Continue preadmission atorvastatin when NGT is in or able to take PO - Hold preadmission ASA, carvedilol, xarelto, imdur.  RENAL 1. Mild hyponatremia: - Appears chronic and at baseline; continue to monitor 2. CKD:  - Creatinine 1.30 today which is at his baseline of 1.3-1.7 - Continue to monitor UOP; avoid nephrotoxic agents - KVO IVF 3. Hypokalemia and Hypomagnesemia: - Replace and recheck  GASTROINTESTINAL No active issues   HEMATOLOGIC 1. Anemia: - Hgb 9.5, which is at baseline of Hgb 9-10 - Continue to monitor  INFECTIOUS 1. Sepsis due to Aspiration pneumonia: - Sputum culture and blood culture penidng, UA was never sent - Hx of recent admissions and is MRSA positive from nares; continue Vanc and Zosyn to treat this as an HCAP; hopefully deescalate quickly based on cultures   ENDOCRINE No active issues   NEUROLOGIC 1. Acute left MCA infarct - s/p IR revascularization.  Per IR notes, small hemorrhagic conversion post procedure. Hx multiple strokes. - Stroke workup per neuro. - Continue PT/OT, Speech, and BP management. - Swallow evaluation today  Family updated: No family  bedside, hold in the ICU given respiratory concerns  The patient is critically ill with multiple organ systems failure and requires high complexity decision making for assessment and support, frequent evaluation and titration of therapies, application of advanced monitoring technologies and extensive interpretation of multiple databases.   Critical Care Time devoted to patient care services described in this note is  35  Minutes. This time reflects time of care of this signee Dr Koren BoundWesam Yacoub. This critical care time does not reflect procedure time, or teaching time or supervisory time of PA/NP/Med student/Med Resident etc but could involve care discussion time.  Alyson ReedyWesam G. Yacoub, M.D. [facility] Pulmonary/Critical Care Medicine. Pager: [phone]. After hours pager: [phone]. .[name]

## 2016-11-30 NOTE — Progress Notes (Signed)
Pt is resting off BiPAP.   Speech was notified to come evaluate pt.   Per MD, if pt fails swallow evaluation, place order for corTrack, but does not want to irritate airway unless absolutely necessary.   

## 2016-11-30 NOTE — Progress Notes (Addendum)
Pt is resting off BiPAP.   Speech was notified to come evaluate pt.   Per MD, if pt fails swallow evaluation, place order for corTrack, but does not want to irritate airway unless absolutely necessary.

## 2016-11-30 NOTE — Progress Notes (Signed)
Pt taken off Bipap at this time and placed on [place] 3 L with humidity for RN to give meds. Pt tolerating well at this time.

## 2016-12-01 ENCOUNTER — Ambulatory Visit: Payer: Medicare Other | Admitting: Neurology

## 2016-12-01 ENCOUNTER — Inpatient Hospital Stay (HOSPITAL_COMMUNITY): Payer: Medicare Other

## 2016-12-01 ENCOUNTER — Encounter (HOSPITAL_COMMUNITY): Payer: Self-pay | Admitting: Physical Medicine & Rehabilitation

## 2016-12-01 DIAGNOSIS — R0602 Shortness of breath: Secondary | ICD-10-CM

## 2016-12-01 DIAGNOSIS — J45909 Unspecified asthma, uncomplicated: Secondary | ICD-10-CM

## 2016-12-01 DIAGNOSIS — I6340 Cerebral infarction due to embolism of unspecified cerebral artery: Secondary | ICD-10-CM

## 2016-12-01 DIAGNOSIS — Z8673 Personal history of transient ischemic attack (TIA), and cerebral infarction without residual deficits: Secondary | ICD-10-CM

## 2016-12-01 DIAGNOSIS — Z8739 Personal history of other diseases of the musculoskeletal system and connective tissue: Secondary | ICD-10-CM

## 2016-12-01 DIAGNOSIS — E119 Type 2 diabetes mellitus without complications: Secondary | ICD-10-CM

## 2016-12-01 DIAGNOSIS — I69391 Dysphagia following cerebral infarction: Secondary | ICD-10-CM

## 2016-12-01 DIAGNOSIS — R4701 Aphasia: Secondary | ICD-10-CM

## 2016-12-01 DIAGNOSIS — R0682 Tachypnea, not elsewhere classified: Secondary | ICD-10-CM

## 2016-12-01 DIAGNOSIS — D62 Acute posthemorrhagic anemia: Secondary | ICD-10-CM

## 2016-12-01 DIAGNOSIS — N183 Chronic kidney disease, stage 3 unspecified: Secondary | ICD-10-CM

## 2016-12-01 DIAGNOSIS — I482 Chronic atrial fibrillation: Secondary | ICD-10-CM

## 2016-12-01 DIAGNOSIS — E876 Hypokalemia: Secondary | ICD-10-CM

## 2016-12-01 DIAGNOSIS — J189 Pneumonia, unspecified organism: Secondary | ICD-10-CM

## 2016-12-01 DIAGNOSIS — I5032 Chronic diastolic (congestive) heart failure: Secondary | ICD-10-CM

## 2016-12-01 DIAGNOSIS — I480 Paroxysmal atrial fibrillation: Secondary | ICD-10-CM

## 2016-12-01 DIAGNOSIS — I10 Essential (primary) hypertension: Secondary | ICD-10-CM

## 2016-12-01 LAB — GLUCOSE, CAPILLARY
GLUCOSE-CAPILLARY: 247 mg/dL — AB (ref 65–99)
GLUCOSE-CAPILLARY: 221 mg/dL — AB (ref 65–99)
Glucose-Capillary: 148 mg/dL — ABNORMAL HIGH (ref 65–99)
Glucose-Capillary: 216 mg/dL — ABNORMAL HIGH (ref 65–99)
Glucose-Capillary: 95 mg/dL (ref 65–99)

## 2016-12-01 LAB — CBC WITH DIFFERENTIAL/PLATELET
BASOS PCT: 0 %
Basophils Absolute: 0 10*3/uL (ref 0.0–0.1)
Eosinophils Absolute: 0 10*3/uL (ref 0.0–0.7)
Eosinophils Relative: 0 %
HEMATOCRIT: 23 % — AB (ref 39.0–52.0)
Hemoglobin: 7.8 g/dL — ABNORMAL LOW (ref 13.0–17.0)
LYMPHS ABS: 0.2 10*3/uL — AB (ref 0.7–4.0)
LYMPHS PCT: 2 %
MCH: 30.7 pg (ref 26.0–34.0)
MCHC: 33.9 g/dL (ref 30.0–36.0)
MCV: 90.6 fL (ref 78.0–100.0)
MONO ABS: 0.1 10*3/uL (ref 0.1–1.0)
MONOS PCT: 1 %
NEUTROS ABS: 11.4 10*3/uL — AB (ref 1.7–7.7)
Neutrophils Relative %: 97 %
Platelets: 168 10*3/uL (ref 150–400)
RBC: 2.54 MIL/uL — ABNORMAL LOW (ref 4.22–5.81)
RDW: 15.2 % (ref 11.5–15.5)
WBC: 11.8 10*3/uL — ABNORMAL HIGH (ref 4.0–10.5)

## 2016-12-01 LAB — BASIC METABOLIC PANEL
Anion gap: 10 (ref 5–15)
Anion gap: 10 (ref 5–15)
BUN: 23 mg/dL — ABNORMAL HIGH (ref 6–20)
BUN: 24 mg/dL — AB (ref 6–20)
CALCIUM: 8.4 mg/dL — AB (ref 8.9–10.3)
CALCIUM: 8.7 mg/dL — AB (ref 8.9–10.3)
CHLORIDE: 104 mmol/L (ref 101–111)
CO2: 19 mmol/L — AB (ref 22–32)
CO2: 20 mmol/L — AB (ref 22–32)
CREATININE: 1.55 mg/dL — AB (ref 0.61–1.24)
Chloride: 100 mmol/L — ABNORMAL LOW (ref 101–111)
Creatinine, Ser: 1.56 mg/dL — ABNORMAL HIGH (ref 0.61–1.24)
GFR calc Af Amer: 44 mL/min — ABNORMAL LOW (ref >60)
GFR calc Af Amer: 44 mL/min — ABNORMAL LOW (ref >60)
GFR calc non Af Amer: 38 mL/min — ABNORMAL LOW (ref >60)
GFR calc non Af Amer: 38 mL/min — ABNORMAL LOW (ref >60)
GLUCOSE: 224 mg/dL — AB (ref 65–99)
Glucose, Bld: 87 mg/dL (ref 65–99)
Potassium: 2.8 mmol/L — ABNORMAL LOW (ref 3.5–5.1)
Potassium: 3.7 mmol/L (ref 3.5–5.1)
Sodium: 129 mmol/L — ABNORMAL LOW (ref 135–145)
Sodium: 134 mmol/L — ABNORMAL LOW (ref 135–145)

## 2016-12-01 LAB — MAGNESIUM: Magnesium: 1.7 mg/dL (ref 1.7–2.4)

## 2016-12-01 LAB — PHOSPHORUS: Phosphorus: 3.4 mg/dL (ref 2.5–4.6)

## 2016-12-01 MED ORDER — POTASSIUM CHLORIDE 10 MEQ/100ML IV SOLN
10.0000 meq | INTRAVENOUS | Status: AC
  Administered 2016-12-01 (×2): 10 meq via INTRAVENOUS
  Filled 2016-12-01 (×5): qty 100

## 2016-12-01 MED ORDER — POTASSIUM CHLORIDE 10 MEQ/100ML IV SOLN
10.0000 meq | INTRAVENOUS | Status: DC
  Administered 2016-12-01 (×4): 10 meq via INTRAVENOUS

## 2016-12-01 MED ORDER — INSULIN ASPART 100 UNIT/ML ~~LOC~~ SOLN
0.0000 [IU] | SUBCUTANEOUS | Status: DC
  Administered 2016-12-01 (×2): 7 [IU] via SUBCUTANEOUS
  Administered 2016-12-01 – 2016-12-02 (×3): 3 [IU] via SUBCUTANEOUS

## 2016-12-01 MED ORDER — POTASSIUM CHLORIDE 20 MEQ/15ML (10%) PO SOLN
40.0000 meq | Freq: Two times a day (BID) | ORAL | Status: DC
  Administered 2016-12-01: 40 meq
  Filled 2016-12-01: qty 30

## 2016-12-01 MED ORDER — PANTOPRAZOLE SODIUM 40 MG PO TBEC
40.0000 mg | DELAYED_RELEASE_TABLET | Freq: Every day | ORAL | Status: DC
  Administered 2016-12-01 – 2016-12-03 (×3): 40 mg via ORAL
  Filled 2016-12-01 (×3): qty 1

## 2016-12-01 MED ORDER — CHLORHEXIDINE GLUCONATE 0.12 % MT SOLN
OROMUCOSAL | Status: AC
  Administered 2016-12-01: 15 mL via OROMUCOSAL
  Filled 2016-12-01: qty 15

## 2016-12-01 MED ORDER — CARVEDILOL 3.125 MG PO TABS
6.2500 mg | ORAL_TABLET | Freq: Two times a day (BID) | ORAL | Status: DC
  Administered 2016-12-01 – 2016-12-03 (×4): 6.25 mg via ORAL
  Filled 2016-12-01 (×4): qty 2

## 2016-12-01 MED ORDER — POTASSIUM CHLORIDE 10 MEQ/100ML IV SOLN
INTRAVENOUS | Status: AC
  Administered 2016-12-01: 10 meq via INTRAVENOUS
  Filled 2016-12-01: qty 100

## 2016-12-01 NOTE — Progress Notes (Signed)
Name: [name] MRN: [id number] DOB: 10/23/1926    ADMISSION DATE:  11/26/2016 CONSULTATION DATE:11/27/16  REFERRING FA:OZHYQMVD:[place]  CHIEF COMPLAINT:Aphasia  HISTORY OF PRESENT ILLNESS:83 y.o.malewith hx multiple strokes in the past, A.fib on xarelto. [facility]17 with acute aphasia and right sided weakness. Had similar symptoms with acute CVA 1 month prior.  In ED, CT demonstrated dense left M2 segment. He was taken to IR where he had left common carotid arteriogram followed by complete revascularization of occluded left MCA. Per IR notes, had small hemorrhagic conversion in left caudate and left parietal occipital.  He returned to the ICU on the ventilator and PCCM was asked to see for vent management. Extubated 10/19, had required intermittent bipap post extubation.   STUDIES: CT cerebral perfusion 10/18 > left M2 occlusion, left posterior MCA core infarct. 4.2cm ascending aortic aneurysm.  SIGNIFICANT EVENTS: 10/18 > admit, to IR for revascularization. 10/19: fever overnight, pancultured, started antibiotics  LINES/TUBES: ETT 10/18 >10/19 R femoral sheath 10/18 >10/18 PM 2-PIV's   SUBJECTIVE:   Has not required bipap since yesterday am.  Failed bedside swallow eval, awaiting MBS.    VITAL SIGNS: Temp:  [97.9 F (36.6 C)-98.8 F (37.1 C)] 98.3 F (36.8 C) (10/22 0800) Pulse Rate:  [31-261] 87 (10/22 0937) Resp:  [14-27] 19 (10/22 0937) BP: (114-162)/(64-109) 154/89 (10/22 0937) SpO2:  [88 %-100 %] 97 % (10/22 0937) FiO2 (%):  [30 %] 30 % (10/21 1150)  PHYSICAL EXAMINATION: General:  Elderly, non english speaking male, mild dyspnea but NAD  Neuro:  Awake, alert, non english speaking HEENT:  Mm dry, no JVD  Cardiovascular:  s1s2 irreg Lungs:  resps even, not labored but a bit dyspneic, upper airway wheeze, baseline per RN (per daughter), diminished bases, few scattered exp wheeze  Abdomen:  Round, soft, non tender    Musculoskeletal:  No edema, warm and dry    Recent Labs Lab 11/29/16 0419 11/30/16 0328 12/01/16 0355  NA 131* 133* 129*  K 3.4* 3.9 2.8*  CL 104 104 100*  CO2 18* 16* 19*  BUN 13 20 23*  CREATININE 1.30* 1.46* 1.56*  GLUCOSE 161* 162* 224*    Recent Labs Lab 11/29/16 0419 11/30/16 0328 12/01/16 0355  HGB 9.5* 9.1* 7.8*  HCT 28.2* 27.0* 23.0*  WBC 10.1 13.3* 11.8*  PLT 182 184 168   Dg Chest Port 1 View  Result Date: 11/30/2016 CLINICAL DATA:  Shortness of breath EXAM: PORTABLE CHEST 1 VIEW COMPARISON:  11/28/2016 FINDINGS: Persistent right lower lobe airspace disease. Trace bilateral pleural effusions. No pneumothorax. Stable cardiomegaly. Thoracic aortic atherosclerosis. Interval extubation. No acute osseous abnormality. IMPRESSION: 1. Persistent right lower lobe airspace disease concerning for pneumonia. Followup PA and lateral chest X-ray is recommended in 3-4 weeks following trial of antibiotic therapy to ensure resolution and exclude underlying malignancy. Electronically Signed   By: Elige KoHetal  Patel   On: 11/30/2016 12:35    ASSESSMENT / PLAN:  DISCUSSION: 83 y.o.malewith hx A.fib on xarelto and multiple strokes, admitted 10/18 with left MCA infarct. Taken to IR for revascularization then returned to ICU on vent. Per IR notes, had small hemorrhagic conversion in left caudate and left parietal occipital. Now extubated, progressing slowly.    NEUROLOGIC Acute left MCA infarct - s/p IR revascularization. Per IR notes, small hemorrhagic conversion post procedure. Hx multiple strokes. - Stroke workup per neuro. - Continue PT/OT, Speech, and BP management. - Swallow evaluation today  PULMONARY Acute Hypoxic respiratory failure Aspiration Pneumonia  Hx asthma/ emphysema on CT  ?AECOPD  PLAN -  Continue abx as above  Supplemental O2 as needed to keep sats >90%  Continue duonebs, budesonide, brovana  PRN bipap  F/u CXR  NPO until swallow eval  Pulmonary  hygiene  Continue solumedrol 60mg  IV q6h    CARDIOVASCULAR  Hx a.fib (on xarelto) HTN CAD CHF ascending aortic aneurysm Afib with RVR - improved.  Off cardizem gtt.  PLAN -  Continue to hold anticoagulation - Defer timing of resuming anticoagulation to neuro. PRN lopressor  Resume home coreg if passes swallow eval  Continue preadmission atorvastatin when NGT is in or able to take PO Continue hold preadmission ASA, xarelto, imdur.  RENAL Hyponatremia - likely chronic CKD - baseline Scr 1.3-1.7 Hypokalemia  PLAN -  F/u chem  Replete K  Recheck K this pm    GASTROINTESTINAL No active issues   HEMATOLOGIC Anemia: PLAN -   Hgb 9.5, which is at baseline of Hgb 9-10  Continue to monitor SCD's   INFECTIOUS Sepsis due to Aspiration pneumonia - resolved.  PLAN -  Follow cultures  abx as above    ENDOCRINE No active issues    Family updated: 10/22 wife at bedside, non english speaking.  Speak a dialect that is not available on our translation system per RN.  Awaiting daughter who has been translating.    OK for tx out of ICU from PCCM standpoint.   Dirk Dress, NP 12/01/2016  9:40 AM Pager: [phone] or [phone]

## 2016-12-01 NOTE — Progress Notes (Signed)
[facility] ADULT ICU REPLACEMENT PROTOCOL FOR AM LAB REPLACEMENT ONLY  The patient does not apply for the [facility] Adult ICU Electrolyte Replacment Protocol based on the criteria listed below:    Is GFR >/= 40 ml/min? No.  Patient's GFR today is 38   Abnormal electrolyte(s):K2.8,Mg1.7  6. If a panic level lab has been reported, has the CCM MD in charge been notified? Yes.  .   Physician:  Belva Crome, MD  Melrose Nakayama 12/01/2016 6:22 AM

## 2016-12-01 NOTE — Consult Note (Signed)
Physical Medicine and Rehabilitation Consult Reason for Consult: Right sided weakness and aphasia Referring Physician: Dr.Xu   HPI: [name] is a 83 y.o. right handed non-English speaking male with history of multiple strokes most recent 10/21/2017 as well as atrial fibrillation maintained on xarelto, diabetes mellitus, CKD stage III and diastolic congestive heart failure. Per chart review and daughter, patient lives with spouse. He plans to return to his daughter's home. Used a single-point cane and standard walker prior to admission. One level home with 3 steps to entry. Presented 11/27/2016 with acute onset of aphasia and right-sided weakness. CT reviewed, showing frontotemporal lobe infarct.  Per report, CT/MRI showed acute lateral left frontal lobe infarction. Extensive subacute and chronic ischemia including a large subacute left temporal lobe infarct new as compared to 10/18/2016. Small volume subarachnoid hemorrhage in the left perimesencephalic cistern . EEG evidence of mild to moderate generalized background slowing consistent with a bi-hemisphere malfunction. Recent echocardiogram with ejection fraction of 55% no wall motion abnormalities. Left common carotid arteriogram showed occluded left MCA and underwent revascularization per interventional radiology. Patient needed ventilatory support for a short time. Bouts of hypokalemia with potassium supplement added. A chest x-ray 10/21 showed persistent right lower lobe airspace disease concerning for pneumonia currently maintained on broad antibiotic therapy. Physical and occupational therapy evaluations completed with recommendations of physical medicine rehabilitation consult.  Review of Systems  Unable to perform ROS: Language   Past Medical History:  Diagnosis Date  . AAA (abdominal aortic aneurysm) (HCC)   . Anemia   . Asthma   . Carotid artery disease (HCC)   . CHF (congestive heart failure) (HCC)   . Coronary artery  disease   . Depression   . Gout   . H. pylori infection   . Hard of hearing   . Hiatal hernia   . Hyperplasia, prostate   . Hypertension   . PUD (peptic ulcer disease)   . Renal failure, acute (HCC) 11/08/2012   Past Surgical History:  Procedure Laterality Date  . CAROTID STENT INSERTION  2015  . CAROTID STENT INSERTION N/A 05/09/2013   Procedure: CAROTID STENT INSERTION;  Surgeon: Runell Gess, MD;  Location: [facility] CATH LAB;  Service: Cardiovascular;  Laterality: N/A;  . ESOPHAGOGASTRODUODENOSCOPY  06/13/2008,12/31/12  . IR PERCUTANEOUS ART THROMBECTOMY/INFUSION INTRACRANIAL INC DIAG ANGIO  11/27/2016  . LEFT HEART CATHETERIZATION WITH CORONARY ANGIOGRAM N/A 05/04/2013   Procedure: LEFT HEART CATHETERIZATION WITH CORONARY ANGIOGRAM;  Surgeon: Peter M Swaziland, MD;  Location: [facility] CATH LAB;  Service: Cardiovascular;  Laterality: N/A;  . RADIOLOGY WITH ANESTHESIA N/A 11/27/2016   Procedure: IR WITH ANESTHESIA;  Surgeon: Julieanne Cotton, MD;  Location: [facility] OR;  Service: Radiology;  Laterality: N/A;   Family History  Problem Relation Age of Onset  . Colon cancer Neg Hx    Social History:  reports that he quit smoking about 14 years ago. He has never used smokeless tobacco. He reports that he does not drink alcohol or use drugs. Allergies:  Allergies  Allergen Reactions  . Levofloxacin Anaphylaxis and Other (See Comments)    Patient told his daughter that it made him "feel worse than he already did" (overall) Headache also (has refused to take anymore)  . Eliquis [Apixaban] Itching and Swelling  . Phenazopyridine Hcl Other (See Comments)    Unknown reaction  . Septra [Sulfamethoxazole-Trimethoprim] Nausea And Vomiting and Other (See Comments)    GI Upset  . Latex Rash  . Tape Rash  Medications Prior to Admission  Medication Sig Dispense Refill  . allopurinol (ZYLOPRIM) 300 MG tablet Take 300 mg by mouth daily. To prevent gout    . arformoterol (BROVANA) 15 MCG/2ML NEBU Take 2 mLs  (15 mcg total) by nebulization 2 (two) times daily. 120 mL 2  . arformoterol (BROVANA) 15 MCG/2ML NEBU Inhale 2 mLs into the lungs 2 (two) times daily.    [name] aspirin EC 81 MG tablet Take 1 tablet (81 mg total) by mouth daily. 90 tablet 3  . atorvastatin (LIPITOR) 40 MG tablet Take 1 tablet (40 mg total) by mouth daily at 6 PM. 30 tablet 5  . budesonide (PULMICORT) 0.5 MG/2ML nebulizer solution Take 2 mLs (0.5 mg total) by nebulization 2 (two) times daily. 2 mL 12  . carvedilol (COREG) 6.25 MG tablet Take 1 tablet (6.25 mg total) by mouth 2 (two) times daily with a meal. 60 tablet 6  . esomeprazole (NEXIUM) 40 MG capsule Take 1 capsule (40 mg total) by mouth 2 (two) times daily before a meal.    . HYDROcodone-homatropine (HYCODAN) 5-1.5 MG/5ML syrup Take 5 mLs by mouth every 6 (six) hours as needed for cough.    [name] ipratropium-albuterol (DUONEB) 0.5-2.5 (3) MG/3ML SOLN Take 3 mLs by nebulization 2 (two) times daily as needed (for shortness of breath or wheezing).    . isosorbide mononitrate (IMDUR) 30 MG 24 hr tablet Take 30 mg by mouth once daily (Patient taking differently: Take 30 mg by mouth daily. For control of heart pain) 15 tablet 9  . meclizine (ANTIVERT) 25 MG tablet Take 25 mg by mouth 3 (three) times daily as needed for dizziness.     [name] NITROSTAT 0.4 MG SL tablet PLACE 1 TABLET UNDER THE TONGUE EVERY 5 MINUTES FOR 3 DOSES AS NEEDED FOR CHEST PAIN (Patient taking differently: PLACE 0.4 MG UNDER THE TONGUE EVERY 5 MINUTES FOR 3 DOSES AS NEEDED FOR CHEST PAIN) 25 tablet 5  . nystatin (MYCOSTATIN) 100000 UNIT/ML suspension Take 3 mLs by mouth 4 (four) times daily.    . tamsulosin (FLOMAX) 0.4 MG CAPS capsule Take 0.4 mg by mouth daily. To improve bladder function    . XARELTO 15 MG TABS tablet Take 15 mg by mouth daily.     . dabigatran (PRADAXA) 75 MG CAPS capsule Take 1 capsule (75 mg total) by mouth every 12 (twelve) hours. (Patient not taking: Reported on 11/26/2016) 60 capsule 0     Home: Home Living Family/patient expects to be discharged to:: Private residence Living Arrangements: Spouse/significant other Available Help at Discharge: Family, Available 24 hours/day Type of Home: House Home Access: Stairs to enter Secretary/administrator of Steps: 3 Entrance Stairs-Rails: None Home Layout: One level Bathroom Shower/Tub: Tub only Firefighter: Standard Home Equipment: Cane - single point, Environmental consultant - standard Additional Comments: information taken from previous admittion, pt currently intubated  Lives With: Family  Functional History: Prior Function Level of Independence: Needs assistance Comments: no family available to provide information about pt's level of functioning since his recent stroke Functional Status:  Mobility: Bed Mobility Overal bed mobility: Needs Assistance Bed Mobility: Supine to Sit, Sit to Supine Supine to sit: +2 for physical assistance, Max assist Sit to supine: +2 for physical assistance, Total assist General bed mobility comments: initiates supine to sit with head/trunk, requires assist for LEs to advance to hips to EOB and raise trunk Transfers Overall transfer level: Needs assistance Equipment used: 2 person hand held assist Transfers: Sit to/from Stand Sit  to Stand: Max assist, +2 physical assistance General transfer comment: stood from EOB x 1, blocks knees on bed, assist to rise and balance, stood x 1 minute, pt with urinary incontinence upon standing      ADL: ADL Overall ADL's : Needs assistance/impaired General ADL Comments: currently requiring total assist  Cognition: Cognition Overall Cognitive Status: Impaired/Different from baseline (difficult to assess) Arousal/Alertness: Awake/alert Orientation Level: Other (comment) (expressive aphasia) Attention: Focused, Sustained Focused Attention: Appears intact Sustained Attention: Appears intact Cognition Arousal/Alertness: Lethargic Behavior During Therapy:  Flat affect Overall Cognitive Status: Impaired/Different from baseline (difficult to assess) General Comments: pt intubated, not [place] speaking, no family available Difficult to assess due to: Impaired communication, Intubated  Blood pressure 134/65, pulse (!) 31, temperature 98.3 F (36.8 C), temperature source Axillary, resp. rate 16, height 5\' 5"  (1.651 m), weight 63.9 kg (140 lb 14 oz), SpO2 100 %. Physical Exam  Vitals reviewed. Constitutional: He appears well-developed.  83 year old right-handed male. Frail  HENT:  Head: Normocephalic and atraumatic.  Eyes: Right eye exhibits no discharge. Left eye exhibits no discharge.  Pupils reactive to light  Neck: Normal range of motion. Neck supple. No thyromegaly present.  Cardiovascular:  Irregularly irregular  Respiratory:  Decreased breath sounds at the bases without rails + Receiving breathing treatment  GI: Soft. Bowel sounds are normal. He exhibits no distension.  Musculoskeletal: He exhibits no edema or tenderness.  Neurological: He is alert.  Patient is non-English-speaking which further limits overall exam. He is not follow any commands, even when translated by daughter.  Spontaneously moving RUE.    Skin: Skin is warm and dry.  Psychiatric:  Unable to assess due to language and mentation    Results for orders placed or performed during the hospital encounter of 11/26/16 (from the past 24 hour(s))  Glucose, capillary     Status: Abnormal   Collection Time: 11/30/16 12:27 PM  Result Value Ref Range   Glucose-Capillary 178 (H) 65 - 99 mg/dL  Basic metabolic panel     Status: Abnormal   Collection Time: 12/01/16  3:55 AM  Result Value Ref Range   Sodium 129 (L) 135 - 145 mmol/L   Potassium 2.8 (L) 3.5 - 5.1 mmol/L   Chloride 100 (L) 101 - 111 mmol/L   CO2 19 (L) 22 - 32 mmol/L   Glucose, Bld 224 (H) 65 - 99 mg/dL   BUN 23 (H) 6 - 20 mg/dL   Creatinine, Ser 1.61 (H) 0.61 - 1.24 mg/dL   Calcium 8.4 (L) 8.9 - 10.3  mg/dL   GFR calc non Af Amer 38 (L) >60 mL/min   GFR calc Af Amer 44 (L) >60 mL/min   Anion gap 10 5 - 15  CBC with Differential/Platelet     Status: Abnormal   Collection Time: 12/01/16  3:55 AM  Result Value Ref Range   WBC 11.8 (H) 4.0 - 10.5 K/uL   RBC 2.54 (L) 4.22 - 5.81 MIL/uL   Hemoglobin 7.8 (L) 13.0 - 17.0 g/dL   HCT 09.6 (L) 04.5 - 40.9 %   MCV 90.6 78.0 - 100.0 fL   MCH 30.7 26.0 - 34.0 pg   MCHC 33.9 30.0 - 36.0 g/dL   RDW 81.1 91.4 - 78.2 %   Platelets 168 150 - 400 K/uL   Neutrophils Relative % 97 %   Neutro Abs 11.4 (H) 1.7 - 7.7 K/uL   Lymphocytes Relative 2 %   Lymphs Abs 0.2 (L) 0.7 - 4.0 K/uL  Monocytes Relative 1 %   Monocytes Absolute 0.1 0.1 - 1.0 K/uL   Eosinophils Relative 0 %   Eosinophils Absolute 0.0 0.0 - 0.7 K/uL   Basophils Relative 0 %   Basophils Absolute 0.0 0.0 - 0.1 K/uL  Magnesium     Status: None   Collection Time: 12/01/16  3:55 AM  Result Value Ref Range   Magnesium 1.7 1.7 - 2.4 mg/dL  Phosphorus     Status: None   Collection Time: 12/01/16  3:55 AM  Result Value Ref Range   Phosphorus 3.4 2.5 - 4.6 mg/dL  Glucose, capillary     Status: Abnormal   Collection Time: 12/01/16  8:33 AM  Result Value Ref Range   Glucose-Capillary 216 (H) 65 - 99 mg/dL   Comment 1 Notify RN    Comment 2 Document in Chart    Dg Chest Port 1 View  Result Date: 11/30/2016 CLINICAL DATA:  Shortness of breath EXAM: PORTABLE CHEST 1 VIEW COMPARISON:  11/28/2016 FINDINGS: Persistent right lower lobe airspace disease. Trace bilateral pleural effusions. No pneumothorax. Stable cardiomegaly. Thoracic aortic atherosclerosis. Interval extubation. No acute osseous abnormality. IMPRESSION: 1. Persistent right lower lobe airspace disease concerning for pneumonia. Followup PA and lateral chest X-ray is recommended in 3-4 weeks following trial of antibiotic therapy to ensure resolution and exclude underlying malignancy. Electronically Signed   By: Elige KoHetal  Regino Fournet   On:  11/30/2016 12:35    Assessment/Plan: Diagnosis: Left MCA CVA Labs and images independently reviewed.  Records reviewed and summated above. Stroke: Continue secondary stroke prophylaxis and Risk Factor Modification listed below:   Antiplatelet therapy:   Blood Pressure Management:  Continue current medication with prn's with permisive HTN per primary team Statin Agent:   Diabetes management:   ?Right sided hemiparesis: fit for orthosis to prevent contractures (resting hand splint for day, wrist cock up splint at night, PRAFO, etc) Motor recovery: Fluoxetine  1. Does the need for close, 24 hr/day medical supervision in concert with the patient's rehab needs make it unreasonable for this patient to be served in a less intensive setting? Yes 2. Co-Morbidities requiring supervision/potential complications: pneumonia (narrow IV Vanz/Zosn when  Appropriate) hypokalemia (cont to monitor, supplement as necessary), multiple strokes most recent 10/21/2017, atrial fibrillation (cont meds, monitor HR with increased physical activity), diabetes mellitus (Monitor in accordance with exercise and adjust meds as necessary), CKD stage III (avoid nephrotoxic meds), diastolic congestive heart failure (monitor for sign/symptoms of fluid overload), tachypnea (monitor RR and O2 Sats with increased physical exertion), HTN (monitor and provide prns in accordance with increased physical exertion and pain), hypokalemia (continue to monitor and replete as necessary), ABLA (transfuse if necessary to ensure appropriate perfusion for increased activity tolerance) 3. Due to bladder management, bowel management, safety, skin/wound care, disease management, medication administration, pain management and patient education, does the patient require 24 hr/day rehab nursing? Yes 4. Does the patient require coordinated care of a physician, rehab nurse, PT (1-2 hrs/day, 5 days/week), OT (1-2 hrs/day, 5 days/week) and SLP (1-2 hrs/day, 5  days/week) to address physical and functional deficits in the context of the above medical diagnosis(es)? Yes Addressing deficits in the following areas: balance, endurance, locomotion, strength, transferring, bowel/bladder control, bathing, dressing, feeding, grooming, toileting, cognition, speech, language, swallowing and psychosocial support 5. Can the patient actively participate in an intensive therapy program of at least 3 hrs of therapy per day at least 5 days per week? Potentially 6. The potential for patient to make measurable gains while  on inpatient rehab is excellent 7. Anticipated functional outcomes upon discharge from inpatient rehab are min assist and mod assist  with PT, min assist and mod assist with OT, min assist and mod assist with SLP. 8. Estimated rehab length of stay to reach the above functional goals is: 23-27 days. 9. Anticipated D/C setting: Home 10. Anticipated post D/C treatments: HH therapy and Home excercise program 11. Overall Rehab/Functional Prognosis: good and fair  RECOMMENDATIONS: This patient's condition is appropriate for continued rehabilitative care in the following setting: CIR after completion of medical workup and medically stable. Patient has agreed to participate in recommended program. Potentially Note that insurance prior authorization may be required for reimbursement for recommended care.  Comment: Rehab Admissions Coordinator to follow up.  ANGIULLI,DANIEL J., PA-C 12/01/2016

## 2016-12-01 NOTE — Progress Notes (Signed)
Attempted to see pt but pt at test.  Will attempt back as schedule allows.  Tory Emerald, OTR/L

## 2016-12-01 NOTE — Progress Notes (Signed)
Modified Barium Swallow Progress Note  Patient Details  Name: [name] MRN: [id number] Date of Birth: 1926-07-18  Today's Date: 12/01/2016  Modified Barium Swallow completed.  Full report located under Chart Review in the Imaging Section.  Brief recommendations include the following:  Clinical Impression  Patient presents with a functional oropharyngeal swallow. Oral phase mildly delayed, suspect a combination of cognitive and respiratory deficits contributing, with swallow triggering intermittently at the pyriform sinuses with thin liquids but with full airway protection. Recommend initiation of conservative diet for energy conservation with good potential to advance.    Swallow Evaluation Recommendations       SLP Diet Recommendations: Dysphagia 1 (Puree) solids;Thin liquid   Liquid Administration via: Cup;Straw   Medication Administration: Crushed with puree   Supervision: Patient able to self feed;Staff to assist with self feeding;Full supervision/cueing for compensatory strategies   Compensations: Slow rate;Small sips/bites;Monitor for anterior loss   Postural Changes: Seated upright at 90 degrees   Oral Care Recommendations: Oral care BID     [name] Lango MA, CCC-SLP [phone]     [name] 12/01/2016,10:42 AM

## 2016-12-01 NOTE — Progress Notes (Signed)
PT Cancellation Note  Patient Details Name: [name] MRN: [id number] DOB: [birth year]   Cancelled Treatment:    Reason Eval/Treat Not Completed: Patient at procedure or test/unavailable. Pt off the floor. PT to return as able to complete treatment.   Lodema Parma M Eleen Litz 12/01/2016, 10:31 AM   Lewis Shock, PT, DPT Pager #: [phone] Office #: [phone]

## 2016-12-01 NOTE — Progress Notes (Signed)
  Speech Language Pathology Treatment: Dysphagia  Patient Details Name: [name] MRN: [id number] DOB: 1926-08-09 Today's Date: 12/01/2016 Time: 4431-5400 SLP Time Calculation (min) (ACUTE ONLY): 25 min  Assessment / Plan / Recommendation Clinical Impression  Diagnostic po trials provided. Patient with continued anterior labial spillage with liquids, particularly left sided and prolonged oral transit of soft solids which is likely related primarily to missing dentition. Although no overt indication of aspiration noted, patient with increased WOB which may impact full airway protection. Given respiratory impairments and acute CVA, combined with language barrier (spouse present and does not speak English, difficulty finding interpreter in patients dialect), recommend proceeding with MBS to ensure least restrictive and safest diet recommendations are being made. Prognosis for ability to resume a po diet good today.    HPI HPI: [name] a 83 y.o.malewith PMH  including multiple strokes in the past, A.fib on xarelto. He presented to [facility] ED 10/17 with acute aphasia and right sided weakness. Had similar symptoms with acute CVA 1 month prior. After admission pt underwent complete revascularization of occluded left MCA. Per IR notes, had small hemorrhagic conversion in left caudate and left parietal occipital lobes. Intubated from 10/17-10/19. Increased work of breathing post intubation, possibly baseline due to COPD per notes. Likely some ventilator acquired aspiration pna per notes. MBS on 10/20/16 from prior CVA admission shows swallow [facility] though dys 2 diet recommended given mild oral deficits with missing dentition. Pt is known to have baseline aphasia from prior CVA; last SLE 07/18/16 reported persistent aphasia and resolution of dysarthria.       SLP Plan  MBS       Recommendations  Diet recommendations: NPO Medication Administration: Crushed with puree                Oral Care Recommendations: Oral care QID Follow up Recommendations: Other (comment) (TBD) SLP Visit Diagnosis: Dysphagia, unspecified (R13.10) Plan: MBS       GO          [name] Lango MA, CCC-SLP [phone]       [name] 12/01/2016, 9:13 AM

## 2016-12-01 NOTE — Progress Notes (Signed)
Rehab admissions - I called patient's daughter on the phone.  Daughter speaks [place].  Daughter would like inpatient rehab admission.  Dtr says there are plenty of family to help at home after a rehab stay.  Patient has Medicaid in addition to [employer].  I will begin the pre-authorization process.  Call me for questions.  #[phone]

## 2016-12-01 NOTE — Progress Notes (Signed)
eLink Physician-Brief Progress Note Patient Name: [name] DOB: [birth year] MRN: [id number]   Date of Service  12/01/2016  HPI/Events of Note  K 2.8  eICU Interventions  Repleted     Intervention Category Intermediate Interventions: Electrolyte abnormality - evaluation and management  Sacoya Mcgourty 12/01/2016, 6:35 AM

## 2016-12-01 NOTE — Progress Notes (Signed)
STROKE TEAM PROGRESS NOTE   SUBJECTIVE (INTERVAL HISTORY) Wife is at bedside, IV team is working with patient IV access. Pt is awak alert, still has SOB with wheezing, on nasal cannula.  Past swallow on thin liquid. RLE strength improved.   OBJECTIVE Temp:  [98.3 F (36.8 C)-98.8 F (37.1 C)] 98.4 F (36.9 C) (10/22 1600) Pulse Rate:  [31-124] 107 (10/22 1800) Cardiac Rhythm: Atrial fibrillation (10/22 1200) Resp:  [14-27] 24 (10/22 1800) BP: (114-160)/(63-117) 136/107 (10/22 1800) SpO2:  [96 %-100 %] 100 % (10/22 1800)  CBC:   Recent Labs Lab 11/30/16 0328 12/01/16 0355  WBC 13.3* 11.8*  NEUTROABS 12.8* 11.4*  HGB 9.1* 7.8*  HCT 27.0* 23.0*  MCV 91.5 90.6  PLT 184 168    Basic Metabolic Panel:   Recent Labs Lab 11/30/16 0328 12/01/16 0355  NA 133* 129*  K 3.9 2.8*  CL 104 100*  CO2 16* 19*  GLUCOSE 162* 224*  BUN 20 23*  CREATININE 1.46* 1.56*  CALCIUM 8.9 8.4*  MG 1.9 1.7  PHOS 2.8 3.4    Lipid Panel:     Component Value Date/Time   CHOL 89 11/28/2016 0442   CHOL 122 09/09/2013 0815   TRIG 52 11/30/2016 0515   HDL 28 (L) 11/28/2016 0442   HDL 35 (L) 09/09/2013 0815   CHOLHDL 3.2 11/28/2016 0442   VLDL 20 11/28/2016 0442   LDLCALC 41 11/28/2016 0442   LDLCALC 50 09/09/2013 0815   HgbA1c:  Lab Results  Component Value Date   HGBA1C 6.0 (H) 11/28/2016   Urine Drug Screen:     Component Value Date/Time   LABOPIA NONE DETECTED 10/08/2016 1937   COCAINSCRNUR NONE DETECTED 10/08/2016 1937   LABBENZ NONE DETECTED 10/08/2016 1937   AMPHETMU NONE DETECTED 10/08/2016 1937   THCU NONE DETECTED 10/08/2016 1937   LABBARB NONE DETECTED 10/08/2016 1937    Alcohol Level     Component Value Date/Time   ETH <5 10/09/2016 0100    IMAGING I have personally reviewed the radiological images below and agree with the radiology interpretations.  Ct Angio Head W Or Wo Contrast Ct Angio Neck W Or Wo Contrast Ct Cerebral Perfusion W Contrast 11/27/2016 1.  Left middle cerebral artery proximal M2 inferior division occlusion. Poor collateral circulation in the left posterior MCA distribution. 2. CT perfusion demonstrating left posterior MCA distribution core infarct with volume of 48 cc and ischemic penumbra of 81 cc by automated RAPID post processing. 3. Patent carotid and vertebral arteries of the neck. No high-grade stenosis by NASCET criteria, aneurysm, or dissection. 4. Stented left distal common carotid artery to proximal left internal carotid artery. The stent appears widely patent. 5. 4 mm rounded dilation of left posterior communicating artery ICA origin compatible with aneurysm. 6. Left V4 segment focal ectasia measuring 6 mm with ulcerated mural plaque. 7. 4.2 cm ascending aortic aneurysm. Recommend annual imaging followup by CTA or MRA. This recommendation follows 2010 ACCF/AHA/AATS/ACR/ASA/SCA/SCAI/SIR/STS/SVM Guidelines for the Diagnosis and Management of Patients with Thoracic Aortic Disease. Circulation. 2010; 121: [id number]. 8. Moderate to severe mixed plaque of the aorta with multiple plaque ulcerations.  Ct Head Wo Contrast 11/26/2016 1. New foci of hypoattenuation within the left parietal cortex and left caudate head compatible with interval late acute/early subacute infarction from 10/21/2016. Subcentimeter focus of hemorrhage within the new left caudate head infarction. No significant mass effect. 2. Hemorrhagic conversion of left parietooccipital infarction. No significant mass effect or extra-axial collection. 3. Multiple chronic infarcts as described  are stable. 4. Hyperdense distal left MCA in the MCA cistern may represent thrombus. 11/27/2016 1. Enhancement of left caudate head, scattered foci of the left frontal parietal lobes, and left lateral temporal lobe areas of acute infarction correspond to infarct core on the prior CT perfusion. No new area of acute infarction identified at this time. No significant mass effect. 2.  Increased density within the left temporoparietal lobe hemorrhagic infarction probably represents superimposed enhancement. Evaluation for new hemorrhage is limited in the presence of contrast. 3. Soft tissue thickening of right external auditory canal, correlate for possible otitis externa. No bony destructive changes. 11/27/2016 1. Increased subarachnoid contrast/hemorrhage in the lower left sylvian fissure and perimesencephalic cistern, overall small volume. 2. Contrast staining in the infarcted left temporoparietal cortex after recent procedure. 3. Petechial hemorrhage in subacute infarcts in the left caudate head and left parietooccipital cortex as seen on admission noncontrast head CT. 4. Moderate acute lateral left frontal infarct.  Mr Brain Wo Contrast 11/27/2016 1. Acute left lateral frontal lobe infarct. 2. Extensive subacute and chronic ischemia as above, including a large subacute left temporal lobe infarct which is new from 10/18/2016. Associated petechial hemorrhage. 3. Small volume subarachnoid hemorrhage in the left perimesencephalic cistern.  Portable Chest Xray 11/27/2016 1. Endotracheal tube tip noted 2.7 cm above the carina. 2. Cardiomegaly with normal pulmonary vascularity. 3. Low lung volumes with mild bilateral atelectatic changes . 11/28/2016 1. Support apparatus, as above. 2. New ill-defined opacities in the base of the right lung concerning for developing airspace consolidation, favored to be in the right lower lobe. 3. Probable small right pleural effusion. 4. Aortic atherosclerosis. 11/30/16 1. Persistent right lower lobe airspace disease concerning for pneumonia. Followup PA and lateral chest X-ray is recommended in 3-4 weeks following trial of antibiotic therapy to ensure resolution and exclude underlying malignancy. 12/02/16 pending   PHYSICAL EXAM  Temp:  [98.3 F (36.8 C)-98.8 F (37.1 C)] 98.4 F (36.9 C) (10/22 1600) Pulse Rate:  [31-124] 107  (10/22 1800) Resp:  [14-27] 24 (10/22 1800) BP: (114-160)/(63-117) 136/107 (10/22 1800) SpO2:  [96 %-100 %] 100 % (10/22 1800)  General - awake, alert, following pantomime but not verbal commands likely language barrier  Cardiovascular - Irregular rhythm with normal rate.  Mental Status -  level of consciousness awake and moving. Global aphasia  Cranial Nerves II - XII - II - Visual field intact to threats b/l. III, IV, VI - Extraocular movements intact, mild left gaze preference and mild right neglect VII - Facial movement intact bilaterally. VIII - Hearing grossly intact XII - Tongue protrusion intact.  Motor Strength - Strength was symmetrical BUEs. LLE 5/5. RLE 3/5, able to against gravity with drift.  Motor Tone - Muscle tone was normal  Reflexes - The patient's reflexes were 1+ with negative babinski.  Sensory - Not assessed  Coordination - No appreciable dysmetria or tremor but limited exam.  Gait and Station - Not assessed   ASSESSMENT/PLAN Mr. Laydon Berteau is a 83 y.o. male with history of multiple strokes, atrial fibrillation, hypertension, CAD, CHF presenting with aphasia and right sided weakness. He did not receive IV t-PA due to anticoagulation but underwent endovascular clot retraction.   Stroke:  left MCA embolic secondary to proximal M2  Occlusion s/p thrombectomy with TICI3 revascularization  Resultant  RLE weakness, aphasia  CT head New foci of hypoattenuation within the left parietal cortex and left caudate head compatible with interval late acute/early subacute infarction from 10/21/2016.  MRI head Acute  left lateral frontal lobe infarct with associated petechial hemorrhage.  CTA head Left middle cerebral artery proximal M2 inferior division occlusion.  CTA Neck stented left distal common carotid artery to proximal left internal carotid artery  IR - s/p left M2 revascularization with TICI3, left ICA stent only 30% stenosis  2D Echo  From  10/19/2016 LVEF 50-55%, no embolic source  LDL 41  HgbA1c 6.0%  SCDs for VTE prophylaxis DIET - DYS 1 Room service appropriate? Yes; Fluid consistency: Thin  Xarelto (rivaroxaban) daily prior to admission, now on No antithrombotic. No AC at this time due to risk of hemorrhagic transformation. Within 7-10 days, if pt stable and no planned procedure, will consider to start coumadin with INR goal 2.5-3.5 to help for stroke prevention.   Ongoing aggressive stroke risk factor management  Therapy recommendations:  CIR  Disposition:  Pending  Hx of embolic strokes    10/18/16 acute left MCA infarct, left thalamic and left ACA/PCA at high convexity infarcts - CTA head and neck left ICA stent 70% stenosis and right M1 high grade stenosis. EF 50-55% improved. LDL 45 and A1C 6.0. His AC was on hold during admission but later changed to Xarelto and ASA as outpt.  10/09/16 acute right PLIC infarct - small vessel disease - right M1 75% stenosis - EF 45-50% - LDL 50 and A1C 6.4 - continued on pradaxa and ASA  07/2016 - right MCA infarct - right M1 75% stenosis, left ICA stent 70% stenosis - EF 45-50% - LDL 42 and A1C 6.4 - Xarelto changed to pradaxa  04/2013 - multifocal cardioembolic infarcts - new diagnosed afib - EF 35% - put on eliquis 2.5mg  bid in 08/2013 - however, pt was on Xarelto 10/2013 as per chart  Respiratory distress  Extubated  CXR showed possible pneumonia - on vanco and zosyn  SOB and wheezing  Off BiPAP and face mask, on nasal cannula  On solumedrol, CPT, duo nebs, and pulmicort nebulization  CCM on board  CXR repeat 11/30/16 -persistent right lower lobe pneumonia  Afib, chronic  Rate - still RVR  Persistent   Off cardizem  Dysphagia   On dysphagia 1 and thin liquid  Appreciate speech assistance  Hypertension  Stable BP goal to <180 SBP On low-dose Coreg Long-term BP goal normotensive <140/90  Hyperlipidemia  Home meds:  Atorvastatin 40mg , resumed in  hospital  LDL 41, goal < 70  Lipitor resumed  Other Stroke Risk Factors  Advanced age   Coronary artery disease  Other active problems  AKI - Cre 1.46->1.30->1.46->1.56  Follows up with Dr. Pearlean Brownie at [facility] day # 4  This patient is critically ill due to recurrent embolic strokes, afib, s/p IR, SOB wheezing, penumonia and at significant risk of neurological worsening, death form recurrent stroke, hemorrhagic conversion, heart failure, respiratory failure. This patient's care requires constant monitoring of vital signs, hemodynamics, respiratory and cardiac monitoring, review of multiple databases, neurological assessment, discussion with family, other specialists and medical decision making of high complexity. I spent 30 minutes of neurocritical care time in the care of this patient.  Marvel Plan, MD PhD Stroke Neurology 12/01/2016 6:36 PM   To contact Stroke Continuity provider, please refer to [email or web address]. After hours, contact General Neurology

## 2016-12-02 ENCOUNTER — Inpatient Hospital Stay (HOSPITAL_COMMUNITY): Payer: Medicare Other

## 2016-12-02 ENCOUNTER — Ambulatory Visit: Payer: Medicare Other | Admitting: Cardiovascular Disease

## 2016-12-02 DIAGNOSIS — I4819 Other persistent atrial fibrillation: Secondary | ICD-10-CM

## 2016-12-02 LAB — GLUCOSE, CAPILLARY
GLUCOSE-CAPILLARY: 156 mg/dL — AB (ref 65–99)
Glucose-Capillary: 135 mg/dL — ABNORMAL HIGH (ref 65–99)
Glucose-Capillary: 142 mg/dL — ABNORMAL HIGH (ref 65–99)
Glucose-Capillary: 225 mg/dL — ABNORMAL HIGH (ref 65–99)
Glucose-Capillary: 245 mg/dL — ABNORMAL HIGH (ref 65–99)

## 2016-12-02 LAB — VANCOMYCIN, TROUGH: Vancomycin Tr: 15 ug/mL (ref 15–20)

## 2016-12-02 MED ORDER — IPRATROPIUM-ALBUTEROL 0.5-2.5 (3) MG/3ML IN SOLN
3.0000 mL | Freq: Four times a day (QID) | RESPIRATORY_TRACT | Status: DC
  Administered 2016-12-02: 3 mL via RESPIRATORY_TRACT
  Filled 2016-12-02: qty 3

## 2016-12-02 MED ORDER — IPRATROPIUM-ALBUTEROL 0.5-2.5 (3) MG/3ML IN SOLN: 3.0000 mL | Freq: Four times a day (QID) | RESPIRATORY_TRACT | Status: DC | PRN

## 2016-12-02 MED ORDER — PREDNISONE 20 MG PO TABS
40.0000 mg | ORAL_TABLET | Freq: Every day | ORAL | Status: DC
  Administered 2016-12-03: 40 mg via ORAL
  Filled 2016-12-02: qty 2

## 2016-12-02 MED ORDER — CHLORHEXIDINE GLUCONATE 0.12 % MT SOLN
OROMUCOSAL | Status: AC
  Administered 2016-12-02: 15 mL
  Filled 2016-12-02: qty 15

## 2016-12-02 NOTE — Care Management Note (Addendum)
Case Management Note  Patient Details  Name: [name] MRN: [id number] Date of Birth: [birth year]  Subjective/Objective:    Pt admitted with acute aphasia and R sided weakness. 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  PTA, pt resided at home with spouse.  He has supportive daughter.                   Action/Plan: PT/OT recommending CIR and consult in progress.  Hopeful for admission to CIR this week.    Expected Discharge Date:                  Expected Discharge Plan:  IP Rehab Facility  In-House Referral:  Clinical Social Work  Discharge planning Services  CM Consult  Post Acute Care Choice:    Choice offered to:     DME Arranged:    DME Agency:     HH Arranged:    HH Agency:     Status of Service:  In process, will continue to follow  If discussed at Long Length of Stay Meetings, dates discussed:    Additional Comments:  12/02/16 J. Laykin Rainone, RN, BSN Daughter given information on [facility] and private duty caregivers, as she has concerns about caring for her parents at [employer].    Quintella Baton, RN, BSN  Trauma/Neuro ICU Case Manager [phone]

## 2016-12-02 NOTE — Progress Notes (Signed)
Physical Therapy Treatment Patient Details Name: [name] MRN: [id number] DOB: 08/09/1926 Today's Date: 12/02/2016    History of Present Illness Pt admitted with acute aphasia and R sided weakness. 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 PMH: multiple CVAs, most recent 10/21/17, afib, DM, afib.    PT Comments    Pt much improved today and was able to follow simple commands on L side 50% of time. Pt with noted strong R sided neglect. Pt able to transfer to eob with minA and tolerated ambulation of 150' with maxAx2 via HHA. Pt remains approriate for CIR upon d/c to maximize functional recovery to decrease burden of care on family.    Follow Up Recommendations  CIR;Supervision/Assistance - 24 hour     Equipment Recommendations       Recommendations for Other Services Rehab consult     Precautions / Restrictions Precautions Precautions: Fall Precaution Comments: daughter used as Catering managertranslator Restrictions Weight Bearing Restrictions: No    Mobility  Bed Mobility Overal bed mobility: Needs Assistance Bed Mobility: Supine to Sit     Supine to sit: Min assist     General bed mobility comments: after max cues in native language pt able to brings LEs off EOB and trunk into elevation with EOB elevated. pt used R UE to assist with transfer  Transfers Overall transfer level: Needs assistance Equipment used: 2 person hand held assist Transfers: Sit to/from Stand Sit to Stand: Mod assist;Max assist;+2 physical assistance         General transfer comment: max tactile and verbal cues  Ambulation/Gait Ambulation/Gait assistance: Max assist;+2 physical assistance (3rd person for chair follow and lines) Ambulation Distance (Feet): 165 Feet Assistive device: 2 person hand held assist Gait Pattern/deviations: Step-through pattern;Trunk flexed;Narrow base of support Gait velocity: slow Gait velocity interpretation: Below  normal speed for age/gender General Gait Details: pt unable to step to command but once pt provided with forward facilitation pt able to instinctively step. maxA to promote trunk extension and look forward. pt with narrow base of supoort and R lateral lean   Stairs            Wheelchair Mobility    Modified Rankin (Stroke Patients Only) Modified Rankin (Stroke Patients Only) Pre-Morbid Rankin Score: Moderate disability Modified Rankin: Moderately severe disability     Balance Overall balance assessment: Needs assistance Sitting-balance support: Feet supported;Single extremity supported Sitting balance-Leahy Scale: Fair Sitting balance - Comments: pt able to maintain EOB sitting with supervision   Standing balance support: Bilateral upper extremity supported Standing balance-Leahy Scale: Poor Standing balance comment: dependent on physical assist                            Cognition Arousal/Alertness: Awake/alert Behavior During Therapy: WFL for tasks assessed/performed Overall Cognitive Status: Impaired/Different from baseline Area of Impairment: Problem solving;Following commands;Attention;Safety/judgement                   Current Attention Level: Sustained   Following Commands: Follows one step commands inconsistently;Follows one step commands with increased time Safety/Judgement: Decreased awareness of safety;Decreased awareness of deficits (R sided neglect)   Problem Solving: Slow processing;Decreased initiation;Difficulty sequencing;Requires verbal cues;Requires tactile cues General Comments: pt with strong language barrier and noted global aphasia however improved from last week. Pt able to follow commands with L LE when spoke to in native language. Pt unable to follow with R LE until pt is  looking at R side and provided with manaul stimulation. pt with strong R sided neglect      Exercises      General Comments        Pertinent  Vitals/Pain Pain Assessment: Faces Faces Pain Scale: No hurt    Home Living                      Prior Function            PT Goals (current goals can now be found in the care plan section) Progress towards PT goals: Progressing toward goals    Frequency    Min 4X/week      PT Plan Current plan remains appropriate    Co-evaluation              AM-PAC PT "6 Clicks" Daily Activity  Outcome Measure  Difficulty turning over in bed (including adjusting bedclothes, sheets and blankets)?: A Little Difficulty moving from lying on back to sitting on the side of the bed? : A Little Difficulty sitting down on and standing up from a chair with arms (e.g., wheelchair, bedside commode, etc,.)?: Unable Help needed moving to and from a bed to chair (including a wheelchair)?: Total Help needed walking in hospital room?: Total Help needed climbing 3-5 steps with a railing? : Total 6 Click Score: 10    End of Session Equipment Utilized During Treatment: Gait belt Activity Tolerance: Patient tolerated treatment well Patient left: in chair;with call bell/phone within reach;with chair alarm set;with family/visitor present (posey belt in place) Nurse Communication: Mobility status PT Visit Diagnosis: Other abnormalities of gait and mobility (R26.89);Other symptoms and signs involving the nervous system (R29.898);Hemiplegia and hemiparesis Hemiplegia - Right/Left: Right Hemiplegia - dominant/non-dominant: Dominant Hemiplegia - caused by: Cerebral infarction;Other Nontraumatic intracranial hemorrhage     Time: 1610-9604 PT Time Calculation (min) (ACUTE ONLY): 31 min  Charges:  $Gait Training: 8-22 mins $Neuromuscular Re-education: 8-22 mins                    G Codes:       Lewis Shock, PT, DPT Pager #: [phone] Office #: [phone]    [name] 12/02/2016, 1:38 PM

## 2016-12-02 NOTE — Progress Notes (Signed)
Rehab admissions - Awaiting updated OT progress note so that I can submit to insurance carrier in order to request acute inpatient rehab admission.  I met with patient's daughter at the bedside and discussed inpatient rehab.  I went over rehab booklets and information with daughter.  Call me for questions.  #[phone]

## 2016-12-02 NOTE — Progress Notes (Signed)
STROKE TEAM PROGRESS NOTE   SUBJECTIVE (INTERVAL HISTORY) RN is at bedside, family not seen this am. Pt is awak alert, still has SOB with wheezing, but improved from yesterday, on nasal cannula.  Past swallow on thin liquid. RLE still weaker than left.    OBJECTIVE Temp:  [97.6 F (36.4 C)-99.1 F (37.3 C)] 97.6 F (36.4 C) (10/23 0800) Pulse Rate:  [78-136] 110 (10/23 1100) Cardiac Rhythm: Atrial fibrillation (10/23 0850) Resp:  [16-31] 20 (10/23 1100) BP: (112-155)/(57-117) 112/98 (10/23 1100) SpO2:  [89 %-100 %] 89 % (10/23 1100)  CBC:   Recent Labs Lab 11/30/16 0328 12/01/16 0355  WBC 13.3* 11.8*  NEUTROABS 12.8* 11.4*  HGB 9.1* 7.8*  HCT 27.0* 23.0*  MCV 91.5 90.6  PLT 184 168    Basic Metabolic Panel:   Recent Labs Lab 11/30/16 0328 12/01/16 0355 12/01/16 1858  NA 133* 129* 134*  K 3.9 2.8* 3.7  CL 104 100* 104  CO2 16* 19* 20*  GLUCOSE 162* 224* 87  BUN 20 23* 24*  CREATININE 1.46* 1.56* 1.55*  CALCIUM 8.9 8.4* 8.7*  MG 1.9 1.7  --   PHOS 2.8 3.4  --     Lipid Panel:     Component Value Date/Time   CHOL 89 11/28/2016 0442   CHOL 122 09/09/2013 0815   TRIG 52 11/30/2016 0515   HDL 28 (L) 11/28/2016 0442   HDL 35 (L) 09/09/2013 0815   CHOLHDL 3.2 11/28/2016 0442   VLDL 20 11/28/2016 0442   LDLCALC 41 11/28/2016 0442   LDLCALC 50 09/09/2013 0815   HgbA1c:  Lab Results  Component Value Date   HGBA1C 6.0 (H) 11/28/2016   Urine Drug Screen:     Component Value Date/Time   LABOPIA NONE DETECTED 10/08/2016 1937   COCAINSCRNUR NONE DETECTED 10/08/2016 1937   LABBENZ NONE DETECTED 10/08/2016 1937   AMPHETMU NONE DETECTED 10/08/2016 1937   THCU NONE DETECTED 10/08/2016 1937   LABBARB NONE DETECTED 10/08/2016 1937    Alcohol Level     Component Value Date/Time   ETH <5 10/09/2016 0100    IMAGING I have personally reviewed the radiological images below and agree with the radiology interpretations.  Ct Angio Head W Or Wo Contrast Ct  Angio Neck W Or Wo Contrast Ct Cerebral Perfusion W Contrast 11/27/2016 1. Left middle cerebral artery proximal M2 inferior division occlusion. Poor collateral circulation in the left posterior MCA distribution. 2. CT perfusion demonstrating left posterior MCA distribution core infarct with volume of 48 cc and ischemic penumbra of 81 cc by automated RAPID post processing. 3. Patent carotid and vertebral arteries of the neck. No high-grade stenosis by NASCET criteria, aneurysm, or dissection. 4. Stented left distal common carotid artery to proximal left internal carotid artery. The stent appears widely patent. 5. 4 mm rounded dilation of left posterior communicating artery ICA origin compatible with aneurysm. 6. Left V4 segment focal ectasia measuring 6 mm with ulcerated mural plaque. 7. 4.2 cm ascending aortic aneurysm. Recommend annual imaging followup by CTA or MRA. This recommendation follows 2010 ACCF/AHA/AATS/ACR/ASA/SCA/SCAI/SIR/STS/SVM Guidelines for the Diagnosis and Management of Patients with Thoracic Aortic Disease. Circulation. 2010; 121: Z610-R604. 8. Moderate to severe mixed plaque of the aorta with multiple plaque ulcerations.  Ct Head Wo Contrast 11/26/2016 1. New foci of hypoattenuation within the left parietal cortex and left caudate head compatible with interval late acute/early subacute infarction from 10/21/2016. Subcentimeter focus of hemorrhage within the new left caudate head infarction. No significant mass effect.  2. Hemorrhagic conversion of left parietooccipital infarction. No significant mass effect or extra-axial collection. 3. Multiple chronic infarcts as described are stable. 4. Hyperdense distal left MCA in the MCA cistern may represent thrombus. 11/27/2016 1. Enhancement of left caudate head, scattered foci of the left frontal parietal lobes, and left lateral temporal lobe areas of acute infarction correspond to infarct core on the prior CT perfusion. No new area  of acute infarction identified at this time. No significant mass effect. 2. Increased density within the left temporoparietal lobe hemorrhagic infarction probably represents superimposed enhancement. Evaluation for new hemorrhage is limited in the presence of contrast. 3. Soft tissue thickening of right external auditory canal, correlate for possible otitis externa. No bony destructive changes. 11/27/2016 1. Increased subarachnoid contrast/hemorrhage in the lower left sylvian fissure and perimesencephalic cistern, overall small volume. 2. Contrast staining in the infarcted left temporoparietal cortex after recent procedure. 3. Petechial hemorrhage in subacute infarcts in the left caudate head and left parietooccipital cortex as seen on admission noncontrast head CT. 4. Moderate acute lateral left frontal infarct.  Mr Brain Wo Contrast 11/27/2016 1. Acute left lateral frontal lobe infarct. 2. Extensive subacute and chronic ischemia as above, including a large subacute left temporal lobe infarct which is new from 10/18/2016. Associated petechial hemorrhage. 3. Small volume subarachnoid hemorrhage in the left perimesencephalic cistern.  Portable Chest Xray 11/27/2016 1. Endotracheal tube tip noted 2.7 cm above the carina. 2. Cardiomegaly with normal pulmonary vascularity. 3. Low lung volumes with mild bilateral atelectatic changes . 11/28/2016 1. Support apparatus, as above. 2. New ill-defined opacities in the base of the right lung concerning for developing airspace consolidation, favored to be in the right lower lobe. 3. Probable small right pleural effusion. 4. Aortic atherosclerosis. 11/30/16 1. Persistent right lower lobe airspace disease concerning for pneumonia. Followup PA and lateral chest X-ray is recommended in 3-4 weeks following trial of antibiotic therapy to ensure resolution and exclude underlying malignancy. 12/02/16  1. Cardiomegaly with bilateral from interstitial  prominence suggesting CHF. 2. Low lung volumes with bibasilar atelectasis and infiltrates/edema. No interim change .  PHYSICAL EXAM  Temp:  [97.6 F (36.4 C)-99.1 F (37.3 C)] 97.6 F (36.4 C) (10/23 0800) Pulse Rate:  [78-136] 110 (10/23 1100) Resp:  [16-31] 20 (10/23 1100) BP: (112-155)/(57-117) 112/98 (10/23 1100) SpO2:  [89 %-100 %] 89 % (10/23 1100)  General - awake, alert, following pantomime but not verbal commands likely language barrier  Cardiovascular - Irregular rhythm with normal rate.  Mental Status -  level of consciousness awake and moving. Global aphasia  Cranial Nerves II - XII - II - Visual field intact to threats b/l. III, IV, VI - Extraocular movements intact VII - Facial movement intact bilaterally. VIII - Hearing grossly intact XII - Tongue protrusion intact.  Motor Strength - Strength was symmetrical BUEs. LLE 5/5. RLE 2/5.  Motor Tone - Muscle tone was normal  Reflexes - The patient's reflexes were 1+ with negative babinski.  Sensory - Not assessed  Coordination - No appreciable dysmetria or tremor but limited exam.  Gait and Station - Not assessed   ASSESSMENT/PLAN [name] is a 83 y.o. male with history of multiple strokes, atrial fibrillation, hypertension, CAD, CHF presenting with aphasia and right sided weakness. He did not receive IV t-PA due to anticoagulation but underwent endovascular clot retraction.   Stroke:  left MCA embolic secondary to proximal M2  Occlusion s/p thrombectomy with TICI3 revascularization  Resultant  RLE weakness, aphasia  CT  head New foci of hypoattenuation within the left parietal cortex and left caudate head compatible with interval late acute/early subacute infarction from 10/21/2016.  MRI head Acute left lateral frontal lobe infarct with associated petechial hemorrhage.  CTA head Left middle cerebral artery proximal M2 inferior division occlusion.  CTA Neck stented left distal common carotid  artery to proximal left internal carotid artery  IR - s/p left M2 revascularization with TICI3, left ICA stent only 30% stenosis  2D Echo  From 10/19/2016 LVEF 50-55%, no embolic source  LDL 41  HgbA1c 6.0%  SCDs for VTE prophylaxis DIET - DYS 1 Room service appropriate? Yes; Fluid consistency: Thin  Xarelto (rivaroxaban) daily prior to admission, now on No antithrombotic. No AC at this time due to risk of hemorrhagic transformation. Within 3-5 days, if pt stable and no planned procedure, will consider to start coumadin with INR goal 2.5-3.5 to help for stroke prevention.   Ongoing aggressive stroke risk factor management  Therapy recommendations:  CIR  Disposition:  Pending  Hx of embolic strokes    10/18/16 acute left MCA infarct, left thalamic and left ACA/PCA at high convexity infarcts - CTA head and neck left ICA stent 70% stenosis and right M1 high grade stenosis. EF 50-55% improved. LDL 45 and A1C 6.0. His AC was on hold during admission but later changed to Xarelto and ASA as outpt.  10/09/16 acute right PLIC infarct - small vessel disease - right M1 75% stenosis - EF 45-50% - LDL 50 and A1C 6.4 - continued on pradaxa and ASA  07/2016 - right MCA infarct - right M1 75% stenosis, left ICA stent 70% stenosis - EF 45-50% - LDL 42 and A1C 6.4 - Xarelto changed to pradaxa  04/2013 - multifocal cardioembolic infarcts - new diagnosed afib - EF 35% - put on eliquis 2.5mg  bid in 08/2013 - however, pt was on Xarelto 10/2013 as per chart  Respiratory distress  Extubated, on nasal cannula now  Not tolerate BiPAP  CXR showed RLL pneumonia - on vanco and zosyn  SOB and wheezing  On solumedrol, CPT, duo nebs, and pulmicort nebulization  Afib, chronic  Rate controlled now   Off cardizem, on coreg  Within 3-5 days, if pt stable and no planned procedure, will consider to start coumadin with INR goal 2.5-3.5 to help for stroke prevention.   Dysphagia   On dysphagia 1 and thin  liquid  Appreciate speech assistance  Hypertension  Stable On low-dose Coreg Long-term BP goal normotensive   Hyperlipidemia  Home meds:  Atorvastatin 40mg , resumed in hospital  LDL 41, goal < 70  Lipitor resumed  Other Stroke Risk Factors  Advanced age   Coronary artery disease  Other active problems  AKI - Cre 1.46->1.30->1.46->1.56->1.55  [facility]   Hospital day # 5  This patient is critically ill due to recurrent embolic strokes, afib, s/p IR, SOB wheezing, penumonia and at significant risk of neurological worsening, death form recurrent stroke, hemorrhagic conversion, heart failure, respiratory failure. This patient's care requires constant monitoring of vital signs, hemodynamics, respiratory and cardiac monitoring, review of multiple databases, neurological assessment, discussion with family, other specialists and medical decision making of high complexity. I spent 30 minutes of neurocritical care time in the care of this patient.  Marvel PlanJindong Oretta Berkland, MD PhD Stroke Neurology 12/02/2016 11:19 AM   To contact Stroke Continuity provider, please refer to [email or web address]. After hours, contact General Neurology

## 2016-12-02 NOTE — Progress Notes (Signed)
Occupational Therapy Treatment Patient Details Name: [name] MRN: [id number] DOB: 10/10/1926 Today's Date: 12/02/2016    History of present illness Pt admitted with acute aphasia and R sided weakness. 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 PMH: multiple CVAs, most recent 10/21/17, afib, DM, afib.   OT comments  Pt demonstrates significant improvements.  With repetition, and gestures, he is able to engage in simple grooming and LB dressing activities - requires max A, overall.  He demonstrates ideational apraxia, and Rt inattention.  Family is very supportive.  Feel he will benefit from CIR to allow him to reach max independence with ADLs and reduce burdent of care.   Follow Up Recommendations  CIR;Supervision/Assistance - 24 hour    Equipment Recommendations  None recommended by OT    Recommendations for Other Services Rehab consult    Precautions / Restrictions Precautions Precautions: Fall Precaution Comments: daughter present and translated        Mobility Bed Mobility               General bed mobility comments: sitting in chair   Transfers Overall transfer level: Needs assistance Equipment used: 1 person hand held assist Transfers: Sit to/from Stand                Balance Overall balance assessment: Needs assistance Sitting-balance support: Feet supported Sitting balance-Leahy Scale: Fair Sitting balance - Comments: Pt requires close min guard assist to lean forward in chair to don socks                                    ADL either performed or assessed with clinical judgement   ADL Overall ADL's : Needs assistance/impaired Eating/Feeding: Sitting;Maximal assistance Eating/Feeding Details (indicate cue type and reason): Pt requires hand over hand assist, but with repetition will bring spoon to mouth without assist  Grooming: Wash/dry face;Sitting;Moderate assistance Grooming  Details (indicate cue type and reason): Initially required hand over hand assist, but with repetition, and gestures, he was able to perform with min - mod A.  Initially attempted to chew on washcloth              Lower Body Dressing: Maximal assistance;Sit to/from stand Lower Body Dressing Details (indicate cue type and reason): with gestural cues and repetition, pt was able to don Lt sock with min A, and mod A for Rt sock.  He repeatedly attempts to don Rt sock on Lt foot despite Lt foot having sock on it                      Vision       Perception     Praxis      Cognition Arousal/Alertness: Awake/alert Behavior During Therapy: [facility] for tasks assessed/performed                                   General Comments: Pt will follow gestural commands inconsistently.  he is able to sustain attention for 30-60 seconds.   Difficult to accurately assess cognition due to severity of language deficits         Exercises     Shoulder Instructions       General Comments daughter very supportive.  HR to 135     Pertinent Vitals/ Pain  Pain Assessment: Faces Pain Score: 0-No pain  Home Living                                          Prior Functioning/Environment              Frequency  Min 3X/week        Progress Toward Goals  OT Goals(current goals can now be found in the care plan section)  Progress towards OT goals: Progressing toward goals     Plan Discharge plan remains appropriate    Co-evaluation                 AM-PAC PT "6 Clicks" Daily Activity     Outcome Measure   Help from another person eating meals?: A Lot Help from another person taking care of personal grooming?: A Lot Help from another person toileting, which includes using toliet, bedpan, or urinal?: A Lot Help from another person bathing (including washing, rinsing, drying)?: A Lot Help from another person to put on and taking off  regular upper body clothing?: A Lot Help from another person to put on and taking off regular lower body clothing?: A Lot 6 Click Score: 12    End of Session    OT Visit Diagnosis: Unsteadiness on feet (R26.81);Muscle weakness (generalized) (M62.81)   Activity Tolerance Patient tolerated treatment well   Patient Left in chair;with call bell/phone within reach;with chair alarm set;with restraints reapplied;with family/visitor present   Nurse Communication Mobility status        Time: 7793-9030 OT Time Calculation (min): 27 min  Charges: OT General Charges $OT Visit: 1 Visit OT Treatments $Self Care/Home Management : 23-37 mins  [employer], OTR/L [phone]    Jeani Hawking M 12/02/2016, 5:26 PM

## 2016-12-02 NOTE — Progress Notes (Signed)
Pharmacy Antibiotic Note  [name] is a 83 y.o. male admitted on 11/26/2016 with pneumonia. Currently on D#5 of zosyn and vancomycin. A 23.5 hr vanc trough collected today was therapeutic at 15. WBC down to 11.8. SCr trended up slightly to 1.55   Plan: Continue Vancomycin 1g IV q24h Continue Zosyn 3.375g IV q8h Follow c/s, clinical progression,  de-escalation/LOT, relal function  Height: 5\' 5"  (165.1 cm) Weight: 140 lb 14 oz (63.9 kg) IBW/kg (Calculated) : 61.5  Temp (24hrs), Avg:98 F (36.7 C), Min:97.4 F (36.3 C), Max:99.1 F (37.3 C)   Recent Labs Lab 11/26/16 2256  11/28/16 0442 11/29/16 0419 11/30/16 0328 12/01/16 0355 12/01/16 1858 12/02/16 1120  WBC 7.4  --  11.9* 10.1 13.3* 11.8*  --   --   CREATININE 1.37*  < > 1.46* 1.30* 1.46* 1.56* 1.55*  --   VANCOTROUGH  --   --   --   --   --   --   --  15  < > = values in this interval not displayed.  Estimated Creatinine Clearance: 28.7 mL/min (A) (by C-G formula based on SCr of 1.55 mg/dL (H)).    Allergies  Allergen Reactions  . Levofloxacin Anaphylaxis and Other (See Comments)    Patient told his daughter that it made him "feel worse than he already did" (overall) Headache also (has refused to take anymore)  . Eliquis [Apixaban] Itching and Swelling  . Phenazopyridine Hcl Other (See Comments)    Unknown reaction  . Septra [Sulfamethoxazole-Trimethoprim] Nausea And Vomiting and Other (See Comments)    GI Upset  . Latex Rash  . Tape Rash    Antimicrobials this admission: Vancomycin 10/19 >>  Zosyn 10/19 >>   Dose adjustments this admission: n/a  Microbiology results: 10/18 MRSA PCR: pos BCx: ngtd TA: neg  Thank you for allowing pharmacy to be a part of this patient's care.  Vinnie Level, PharmD., BCPS Clinical Pharmacist Pager [phone]

## 2016-12-02 NOTE — Progress Notes (Signed)
Name: [name] MRN: [id number] DOB: Jul 04, 1926    ADMISSION DATE:  11/26/2016 CONSULTATION DATE:11/27/16  REFERRING WU:[id number]  CHIEF COMPLAINT:Aphasia  HISTORY OF PRESENT ILLNESS:83 y.o.malewith hx multiple strokes in the past, A.fib on xarelto. He presented to [facility] ED 10/17 with acute aphasia and right sided weakness. Had similar symptoms with acute CVA 1 month prior. In ED, CT demonstrated dense left M2 segment. He was taken to IR where he had left common carotid arteriogram followed by complete revascularization of occluded left MCA. Per IR notes, had small hemorrhagic conversion in left caudate and left parietal occipital. He returned to the ICU on the ventilator and PCCM was asked to see for vent management. Extubated 10/19, had required intermittent bipap post extubation.   STUDIES: CT cerebral perfusion 10/18 > left M2 occlusion, left posterior MCA core infarct. 4.2cm ascending aortic aneurysm. MBS 10/22 > Dysphagia 1 (Puree) solids; Thin liquid.  SIGNIFICANT EVENTS: 10/18 > admit, to IR for revascularization. 10/19: fever overnight, pancultured, started antibiotics.  LINES/TUBES: ETT 10/18 >10/19 R femoral sheath 10/18 >10/18 PM 2-PIV's  SUBJECTIVE:  Off BiPAP for 48 hours. No distress. Son here earlier and unable to understand the patient, Patient did not follow commands when instructed by son. Modified barium swallow done yesterday Dysphagia 1 (Puree) solids;Thin liquid   VITAL SIGNS: Temp:  [97.6 F (36.4 C)-99.1 F (37.3 C)] 97.6 F (36.4 C) (10/23 0800) Pulse Rate:  [59-136] 116 (10/23 0851) Resp:  [16-31] 29 (10/23 0851) BP: (115-155)/(57-117) 155/84 (10/23 0823) SpO2:  [96 %-100 %] 100 % (10/23 0851)  PHYSICAL EXAMINATION: General:  Elderly, non english speaking male, in NAD Neuro:  Awake, alert, non english speaking. Not following commands per son.  HEENT:  Mm dry, no JVD  Cardiovascular:  s1s2 irreg Lungs:  resps even, not  labored but a bit dyspneic, upper airway wheeze, baseline per RN (per daughter), diminished bases, few scattered exp wheeze  Abdomen:  Round, soft, non tender  Musculoskeletal:  No edema, warm and dry    Recent Labs Lab 11/30/16 0328 12/01/16 0355 12/01/16 1858  NA 133* 129* 134*  K 3.9 2.8* 3.7  CL 104 100* 104  CO2 16* 19* 20*  BUN 20 23* 24*  CREATININE 1.46* 1.56* 1.55*  GLUCOSE 162* 224* 87    Recent Labs Lab 11/29/16 0419 11/30/16 0328 12/01/16 0355  HGB 9.5* 9.1* 7.8*  HCT 28.2* 27.0* 23.0*  WBC 10.1 13.3* 11.8*  PLT 182 184 168   Dg Chest Portable 1 View  Result Date: 12/02/2016 CLINICAL DATA:  Healthcare associated pneumonia. EXAM: PORTABLE CHEST 1 VIEW COMPARISON:  11/30/2016 . FINDINGS: Cardiomegaly with pulmonary vascular prominence and bilateral interstitial prominence. Findings suggest CHF. Basilar atelectasis. Left hemidiaphragm incompletely imaged. No prominent pleural effusion noted. No pneumothorax IMPRESSION: 1. Cardiomegaly with bilateral from interstitial prominence suggesting CHF. 2. Low lung volumes with bibasilar atelectasis and infiltrates/edema. No interim change . Electronically Signed   By: Maisie Fus  Register   On: 12/02/2016 07:23   Dg Chest Port 1 View  Result Date: 11/30/2016 CLINICAL DATA:  Shortness of breath EXAM: PORTABLE CHEST 1 VIEW COMPARISON:  11/28/2016 FINDINGS: Persistent right lower lobe airspace disease. Trace bilateral pleural effusions. No pneumothorax. Stable cardiomegaly. Thoracic aortic atherosclerosis. Interval extubation. No acute osseous abnormality. IMPRESSION: 1. Persistent right lower lobe airspace disease concerning for pneumonia. Followup PA and lateral chest X-ray is recommended in 3-4 weeks following trial of antibiotic therapy to ensure resolution and exclude underlying malignancy. Electronically Signed   By: Alan Ripper  Patel   On: 11/30/2016 12:35   Dg Swallowing Func-speech Pathology  Result Date: 12/01/2016 Objective  Swallowing Evaluation: Type of Study: MBS-Modified Barium Swallow Study Patient Details Name: [name] MRN: [id number] Date of Birth: Nov 25, 1926 Today's Date: 12/01/2016 Time: SLP Start Time (ACUTE ONLY): 1015-SLP Stop Time (ACUTE ONLY): 1030 SLP Time Calculation (min) (ACUTE ONLY): 15 min Past Medical History: Past Medical History: Diagnosis Date . AAA (abdominal aortic aneurysm) (HCC)  . Anemia  . Asthma  . Carotid artery disease (HCC)  . CHF (congestive heart failure) (HCC)  . Coronary artery disease  . Depression  . Gout  . H. pylori infection  . Hard of hearing  . Hiatal hernia  . Hyperplasia, prostate  . Hypertension  . PUD (peptic ulcer disease)  . Renal failure, acute (HCC) 11/08/2012 Past Surgical History: Past Surgical History: Procedure Laterality Date . CAROTID STENT INSERTION  2015 . CAROTID STENT INSERTION N/A 05/09/2013  Procedure: CAROTID STENT INSERTION;  Surgeon: Runell Gess, MD;  Location: [facility] CATH LAB;  Service: Cardiovascular;  Laterality: N/A; . ESOPHAGOGASTRODUODENOSCOPY  06/13/2008,12/31/12 . IR PERCUTANEOUS ART THROMBECTOMY/INFUSION INTRACRANIAL INC DIAG ANGIO  11/27/2016 . LEFT HEART CATHETERIZATION WITH CORONARY ANGIOGRAM N/A 05/04/2013  Procedure: LEFT HEART CATHETERIZATION WITH CORONARY ANGIOGRAM;  Surgeon: Peter M Swaziland, MD;  Location: [facility] CATH LAB;  Service: Cardiovascular;  Laterality: N/A; . RADIOLOGY WITH ANESTHESIA N/A 11/27/2016  Procedure: IR WITH ANESTHESIA;  Surgeon: Julieanne Cotton, MD;  Location: [facility] OR;  Service: Radiology;  Laterality: N/A; HPI: [name] a 83 y.o.malewith PMH  including multiple strokes in the past, A.fib on xarelto. He presented to [facility] ED 10/17 with acute aphasia and right sided weakness. Had similar symptoms with acute CVA 1 month prior. After admission pt underwent complete revascularization of occluded left MCA. Per IR notes, had small hemorrhagic conversion in left caudate and left parietal occipital lobes. Intubated from  10/17-10/19. Increased work of breathing post intubation, possibly baseline due to COPD per notes. Likely some ventilator acquired aspiration pna per notes. MBS on 10/20/16 from prior CVA admission shows swallow [facility] though dys 2 diet recommended given mild oral deficits with missing dentition. Pt is known to have baseline aphasia from prior CVA; last SLE 07/18/16 reported persistent aphasia and resolution of dysarthria.  Subjective: Pt in bed, coughing intermittently (weak, non-productive) Assessment / Plan / Recommendation CHL IP CLINICAL IMPRESSIONS 12/01/2016 Clinical Impression Patient presents with a functional oropharyngeal swallow. Oral phase mildly delayed, suspect a combination of cognitive and respiratory deficits contributing, with swallow triggering intermittently at the pyriform sinuses with thin liquids but with full airway protection. Recommend initiation of conservative diet for energy conservation with good potential to advance.  SLP Visit Diagnosis Dysphagia, unspecified (R13.10) Attention and concentration deficit following -- Frontal lobe and executive function deficit following -- Impact on safety and function Mild aspiration risk   CHL IP TREATMENT RECOMMENDATION 12/01/2016 Treatment Recommendations Therapy as outlined in treatment plan below   Prognosis 12/01/2016 Prognosis for Safe Diet Advancement Good Barriers to Reach Goals Language deficits Barriers/Prognosis Comment -- CHL IP DIET RECOMMENDATION 12/01/2016 SLP Diet Recommendations Dysphagia 1 (Puree) solids;Thin liquid Liquid Administration via Cup;Straw Medication Administration Crushed with puree Compensations Slow rate;Small sips/bites;Monitor for anterior loss Postural Changes Seated upright at 90 degrees   CHL IP OTHER RECOMMENDATIONS 12/01/2016 Recommended Consults -- Oral Care Recommendations Oral care BID Other Recommendations --   CHL IP FOLLOW UP RECOMMENDATIONS 12/01/2016 Follow up Recommendations None   CHL IP FREQUENCY AND  DURATION 12/01/2016 Speech Therapy  Frequency (ACUTE ONLY) min 2x/week Treatment Duration 1 week      CHL IP ORAL PHASE 12/01/2016 Oral Phase WFL Oral - Pudding Teaspoon -- Oral - Pudding Cup -- Oral - Honey Teaspoon -- Oral - Honey Cup -- Oral - Nectar Teaspoon -- Oral - Nectar Cup -- Oral - Nectar Straw -- Oral - Thin Teaspoon -- Oral - Thin Cup -- Oral - Thin Straw -- Oral - Puree -- Oral - Mech Soft -- Oral - Regular -- Oral - Multi-Consistency -- Oral - Pill -- Oral Phase - Comment --  CHL IP PHARYNGEAL PHASE 12/01/2016 Pharyngeal Phase Impaired Pharyngeal- Pudding Teaspoon -- Pharyngeal -- Pharyngeal- Pudding Cup -- Pharyngeal -- Pharyngeal- Honey Teaspoon -- Pharyngeal -- Pharyngeal- Honey Cup -- Pharyngeal -- Pharyngeal- Nectar Teaspoon -- Pharyngeal -- Pharyngeal- Nectar Cup -- Pharyngeal -- Pharyngeal- Nectar Straw -- Pharyngeal -- Pharyngeal- Thin Teaspoon -- Pharyngeal -- Pharyngeal- Thin Cup Delayed swallow initiation-vallecula;Delayed swallow initiation-pyriform sinuses Pharyngeal -- Pharyngeal- Thin Straw Delayed swallow initiation-vallecula;Delayed swallow initiation-pyriform sinuses Pharyngeal -- Pharyngeal- Puree WFL Pharyngeal -- Pharyngeal- Mechanical Soft WFL Pharyngeal -- Pharyngeal- Regular -- Pharyngeal -- Pharyngeal- Multi-consistency -- Pharyngeal -- Pharyngeal- Pill (No Data) Pharyngeal -- Pharyngeal Comment --  CHL IP CERVICAL ESOPHAGEAL PHASE 12/01/2016 Cervical Esophageal Phase WFL Pudding Teaspoon -- Pudding Cup -- Honey Teaspoon -- Honey Cup -- Nectar Teaspoon -- Nectar Cup -- Nectar Straw -- Thin Teaspoon -- Thin Cup -- Thin Straw -- Puree -- Mechanical Soft -- Regular -- Multi-consistency -- Pill -- Cervical Esophageal Comment -- CHL IP GO 07/18/2016 Functional Assessment Tool Used NOMS; clinical judgment Functional Limitations Other Speech Language Pathology Swallow Current Status [phone]([phone]) CI Swallow Goal Status (O1308(G8997) CI Swallow Discharge Status [phone]([phone]) (None) Motor Speech Current  Status [phone](G8999) (None) Motor Speech Goal Status (B2841(G9186) (None) Motor Speech Goal Status (L2440(G9158) (None) Spoken Language Comprehension Current Status (N0272(G9159) (None) Spoken Language Comprehension Goal Status (Z3664(G9160) (None) Spoken Language Comprehension Discharge Status [phone](G9161) (None) Spoken Language Expression Current Status (Q2595(G9162) (None) Spoken Language Expression Goal Status (G3875(G9163) (None) Spoken Language Expression Discharge Status [phone](G9164) (None) Attention Current Status (R5188(G9165) (None) Attention Goal Status (C1660(G9166) (None) Attention Discharge Status (Y3016(G9167) (None) Memory Current Status (W1093(G9168) (None) Memory Goal Status (A3557(G9169) (None) Memory Discharge Status (D2202(G9170) (None) Voice Current Status (R4270(G9171) (None) Voice Goal Status (W2376(G9172) (None) Voice Discharge Status (E8315(G9173) (None) Other Speech-Language Pathology Functional Limitation Current Status (V7616(G9174) CJ Other Speech-Language Pathology Functional Limitation Goal Status (W7371(G9175) CI Other Speech-Language Pathology Functional Limitation Discharge Status [phone]([phone]) (None) [name] LangoLeah McCoy MA, CCC-SLP [phone] McCoy Leah Meryl 12/01/2016, 10:43 AM               ASSESSMENT / PLAN:  DISCUSSION: 83 y.o.malewith hx A.fib on xarelto and multiple strokes, admitted 10/18 with left MCA infarct. Taken to IR for revascularization then returned to ICU on vent. Per IR notes, had small hemorrhagic conversion in left caudate and left parietal occipital. Now extubated, progressing slowly. Course complicated by PNA, COPD exacerbation.   NEUROLOGIC Acute left MCA infarct - s/p IR revascularization. Per IR notes, small hemorrhagic conversion post procedure. Hx multiple strokes. - Stroke workup per neuro. - Continue PT/OT, Speech, and BP management. - Swallow evaluation today.  PULMONARY Acute Hypoxic respiratory failure Aspiration Pneumonia Hx asthma/ emphysema on CT  Likely AECOPD (no history of this)  - improving - Supplemental O2 as needed to keep sats >90%  -  Continue duonebs, budesonide, brovana  - Zosyn, Vancomycin. Continue both with PCT high and MRSA swab pos. Narrow as  able - Pulmonary hygiene  - Steroids to prednisone  - Intermittent CXR  CARDIOVASCULAR  Hx a.fib (on xarelto) HTN CAD CHF Ascending aortic aneurysm Afib with RVR - improved.  Off cardizem gtt.  PLAN -  Continue to hold anticoagulation - Defer timing of resuming anticoagulation to neuro. PRN lopressor  PO coreg Continue hold preadmission ASA, xarelto, imdur.  RENAL Hyponatremia - likely chronic CKD - baseline Scr 1.3-1.7. At baseline Hypokalemia  PLAN -  F/u chem, UOP  GASTROINTESTINAL Dysphagia 1 (Puree) solids, Thin liquid  HEMATOLOGIC Anemia: baseline HGB 9-10 PLAN -  Repeat CBC today. - no overt signs of bleeding.   INFECTIOUS Sepsis due to Aspiration pneumonia PLAN -  Follow cultures  abx as above    ENDOCRINE No active issues    Family updated: No family present today.    Joneen Roach, AGACNP-BC [facility] Pulmonology/Critical Care Pager [phone] or [phone]  12/02/2016 9:40 AM

## 2016-12-03 ENCOUNTER — Inpatient Hospital Stay (HOSPITAL_COMMUNITY)
Admission: RE | Admit: 2016-12-03 | Discharge: 2016-12-11 | DRG: 056 | Disposition: A | Discharge status: Home Health | Payer: Medicare Other | Source: Intra-hospital | Attending: Physical Medicine & Rehabilitation | Admitting: Physical Medicine & Rehabilitation

## 2016-12-03 ENCOUNTER — Encounter (HOSPITAL_COMMUNITY): Payer: Self-pay

## 2016-12-03 DIAGNOSIS — I63512 Cerebral infarction due to unspecified occlusion or stenosis of left middle cerebral artery: Secondary | ICD-10-CM | POA: Diagnosis not present

## 2016-12-03 DIAGNOSIS — E1122 Type 2 diabetes mellitus with diabetic chronic kidney disease: Secondary | ICD-10-CM | POA: Diagnosis present

## 2016-12-03 DIAGNOSIS — E876 Hypokalemia: Secondary | ICD-10-CM | POA: Diagnosis not present

## 2016-12-03 DIAGNOSIS — D649 Anemia, unspecified: Secondary | ICD-10-CM | POA: Diagnosis present

## 2016-12-03 DIAGNOSIS — Z7901 Long term (current) use of anticoagulants: Secondary | ICD-10-CM

## 2016-12-03 DIAGNOSIS — F329 Major depressive disorder, single episode, unspecified: Secondary | ICD-10-CM | POA: Diagnosis present

## 2016-12-03 DIAGNOSIS — I6340 Cerebral infarction due to embolism of unspecified cerebral artery: Secondary | ICD-10-CM

## 2016-12-03 DIAGNOSIS — R1310 Dysphagia, unspecified: Secondary | ICD-10-CM | POA: Diagnosis present

## 2016-12-03 DIAGNOSIS — E1142 Type 2 diabetes mellitus with diabetic polyneuropathy: Secondary | ICD-10-CM | POA: Diagnosis present

## 2016-12-03 DIAGNOSIS — R109 Unspecified abdominal pain: Secondary | ICD-10-CM

## 2016-12-03 DIAGNOSIS — Z8673 Personal history of transient ischemic attack (TIA), and cerebral infarction without residual deficits: Secondary | ICD-10-CM

## 2016-12-03 DIAGNOSIS — Z22322 Carrier or suspected carrier of Methicillin resistant Staphylococcus aureus: Secondary | ICD-10-CM

## 2016-12-03 DIAGNOSIS — E785 Hyperlipidemia, unspecified: Secondary | ICD-10-CM | POA: Diagnosis present

## 2016-12-03 DIAGNOSIS — I482 Chronic atrial fibrillation, unspecified: Secondary | ICD-10-CM

## 2016-12-03 DIAGNOSIS — Z95828 Presence of other vascular implants and grafts: Secondary | ICD-10-CM

## 2016-12-03 DIAGNOSIS — I69320 Aphasia following cerebral infarction: Secondary | ICD-10-CM

## 2016-12-03 DIAGNOSIS — I714 Abdominal aortic aneurysm, without rupture: Secondary | ICD-10-CM | POA: Diagnosis present

## 2016-12-03 DIAGNOSIS — N401 Enlarged prostate with lower urinary tract symptoms: Secondary | ICD-10-CM | POA: Diagnosis present

## 2016-12-03 DIAGNOSIS — R0603 Acute respiratory distress: Secondary | ICD-10-CM

## 2016-12-03 DIAGNOSIS — Z8711 Personal history of peptic ulcer disease: Secondary | ICD-10-CM

## 2016-12-03 DIAGNOSIS — R1030 Lower abdominal pain, unspecified: Secondary | ICD-10-CM | POA: Diagnosis not present

## 2016-12-03 DIAGNOSIS — I5032 Chronic diastolic (congestive) heart failure: Secondary | ICD-10-CM

## 2016-12-03 DIAGNOSIS — I509 Heart failure, unspecified: Secondary | ICD-10-CM | POA: Diagnosis not present

## 2016-12-03 DIAGNOSIS — I69351 Hemiplegia and hemiparesis following cerebral infarction affecting right dominant side: Principal | ICD-10-CM

## 2016-12-03 DIAGNOSIS — R300 Dysuria: Secondary | ICD-10-CM | POA: Diagnosis present

## 2016-12-03 DIAGNOSIS — M109 Gout, unspecified: Secondary | ICD-10-CM | POA: Diagnosis present

## 2016-12-03 DIAGNOSIS — R4587 Impulsiveness: Secondary | ICD-10-CM | POA: Diagnosis not present

## 2016-12-03 DIAGNOSIS — Z91048 Other nonmedicinal substance allergy status: Secondary | ICD-10-CM

## 2016-12-03 DIAGNOSIS — I2510 Atherosclerotic heart disease of native coronary artery without angina pectoris: Secondary | ICD-10-CM | POA: Diagnosis present

## 2016-12-03 DIAGNOSIS — Z8739 Personal history of other diseases of the musculoskeletal system and connective tissue: Secondary | ICD-10-CM

## 2016-12-03 DIAGNOSIS — Z7982 Long term (current) use of aspirin: Secondary | ICD-10-CM

## 2016-12-03 DIAGNOSIS — Z7951 Long term (current) use of inhaled steroids: Secondary | ICD-10-CM

## 2016-12-03 DIAGNOSIS — R350 Frequency of micturition: Secondary | ICD-10-CM | POA: Diagnosis not present

## 2016-12-03 DIAGNOSIS — N183 Chronic kidney disease, stage 3 (moderate): Secondary | ICD-10-CM | POA: Diagnosis present

## 2016-12-03 DIAGNOSIS — I69391 Dysphagia following cerebral infarction: Secondary | ICD-10-CM

## 2016-12-03 DIAGNOSIS — R32 Unspecified urinary incontinence: Secondary | ICD-10-CM | POA: Diagnosis present

## 2016-12-03 DIAGNOSIS — Z881 Allergy status to other antibiotic agents status: Secondary | ICD-10-CM

## 2016-12-03 DIAGNOSIS — Z888 Allergy status to other drugs, medicaments and biological substances status: Secondary | ICD-10-CM

## 2016-12-03 DIAGNOSIS — E8770 Fluid overload, unspecified: Secondary | ICD-10-CM | POA: Diagnosis not present

## 2016-12-03 DIAGNOSIS — I130 Hypertensive heart and chronic kidney disease with heart failure and stage 1 through stage 4 chronic kidney disease, or unspecified chronic kidney disease: Secondary | ICD-10-CM | POA: Diagnosis present

## 2016-12-03 DIAGNOSIS — Z79899 Other long term (current) drug therapy: Secondary | ICD-10-CM

## 2016-12-03 DIAGNOSIS — J189 Pneumonia, unspecified organism: Secondary | ICD-10-CM | POA: Diagnosis present

## 2016-12-03 DIAGNOSIS — J690 Pneumonitis due to inhalation of food and vomit: Secondary | ICD-10-CM | POA: Diagnosis not present

## 2016-12-03 DIAGNOSIS — Z91040 Latex allergy status: Secondary | ICD-10-CM

## 2016-12-03 DIAGNOSIS — J45909 Unspecified asthma, uncomplicated: Secondary | ICD-10-CM

## 2016-12-03 DIAGNOSIS — E1165 Type 2 diabetes mellitus with hyperglycemia: Secondary | ICD-10-CM | POA: Diagnosis not present

## 2016-12-03 DIAGNOSIS — Z87891 Personal history of nicotine dependence: Secondary | ICD-10-CM

## 2016-12-03 LAB — CBC WITH DIFFERENTIAL/PLATELET
BASOS ABS: 0 10*3/uL (ref 0.0–0.1)
BASOS PCT: 0 %
EOS PCT: 0 %
Eosinophils Absolute: 0 10*3/uL (ref 0.0–0.7)
HCT: 24.7 % — ABNORMAL LOW (ref 39.0–52.0)
Hemoglobin: 8.3 g/dL — ABNORMAL LOW (ref 13.0–17.0)
Lymphocytes Relative: 4 %
Lymphs Abs: 0.3 10*3/uL — ABNORMAL LOW (ref 0.7–4.0)
MCH: 30.7 pg (ref 26.0–34.0)
MCHC: 33.6 g/dL (ref 30.0–36.0)
MCV: 91.5 fL (ref 78.0–100.0)
MONO ABS: 0.3 10*3/uL (ref 0.1–1.0)
Monocytes Relative: 4 %
Neutro Abs: 7.6 10*3/uL (ref 1.7–7.7)
Neutrophils Relative %: 92 %
PLATELETS: 175 10*3/uL (ref 150–400)
RBC: 2.7 MIL/uL — ABNORMAL LOW (ref 4.22–5.81)
RDW: 15.4 % (ref 11.5–15.5)
WBC: 8.3 10*3/uL (ref 4.0–10.5)

## 2016-12-03 LAB — CULTURE, BLOOD (ROUTINE X 2)
CULTURE: NO GROWTH
CULTURE: NO GROWTH
SPECIAL REQUESTS: ADEQUATE
Special Requests: ADEQUATE

## 2016-12-03 LAB — GLUCOSE, CAPILLARY
GLUCOSE-CAPILLARY: 313 mg/dL — AB (ref 65–99)
Glucose-Capillary: 226 mg/dL — ABNORMAL HIGH (ref 65–99)
Glucose-Capillary: 229 mg/dL — ABNORMAL HIGH (ref 65–99)

## 2016-12-03 LAB — BASIC METABOLIC PANEL
Anion gap: 11 (ref 5–15)
BUN: 25 mg/dL — AB (ref 6–20)
CALCIUM: 8.5 mg/dL — AB (ref 8.9–10.3)
CO2: 24 mmol/L (ref 22–32)
Chloride: 97 mmol/L — ABNORMAL LOW (ref 101–111)
Creatinine, Ser: 1.45 mg/dL — ABNORMAL HIGH (ref 0.61–1.24)
GFR calc Af Amer: 48 mL/min — ABNORMAL LOW (ref >60)
GFR, EST NON AFRICAN AMERICAN: 41 mL/min — AB (ref >60)
GLUCOSE: 217 mg/dL — AB (ref 65–99)
Potassium: 3.2 mmol/L — ABNORMAL LOW (ref 3.5–5.1)
Sodium: 132 mmol/L — ABNORMAL LOW (ref 135–145)

## 2016-12-03 LAB — MAGNESIUM: Magnesium: 1.6 mg/dL — ABNORMAL LOW (ref 1.7–2.4)

## 2016-12-03 LAB — PHOSPHORUS: PHOSPHORUS: 3.4 mg/dL (ref 2.5–4.6)

## 2016-12-03 MED ORDER — CARVEDILOL 6.25 MG PO TABS
6.2500 mg | ORAL_TABLET | Freq: Two times a day (BID) | ORAL | Status: DC
  Administered 2016-12-03 – 2016-12-11 (×15): 6.25 mg via ORAL
  Filled 2016-12-03 (×16): qty 1

## 2016-12-03 MED ORDER — ACETAMINOPHEN 325 MG PO TABS
650.0000 mg | ORAL_TABLET | ORAL | Status: DC | PRN
Start: 2016-12-03 — End: 2016-12-11
  Administered 2016-12-06 – 2016-12-08 (×6): 650 mg via ORAL
  Filled 2016-12-03 (×6): qty 2

## 2016-12-03 MED ORDER — BUDESONIDE 0.5 MG/2ML IN SUSP
0.5000 mg | Freq: Two times a day (BID) | RESPIRATORY_TRACT | Status: DC
  Administered 2016-12-03 – 2016-12-11 (×16): 0.5 mg via RESPIRATORY_TRACT
  Filled 2016-12-03 (×17): qty 2

## 2016-12-03 MED ORDER — VANCOMYCIN HCL IN DEXTROSE 1-5 GM/200ML-% IV SOLN: 1000.0000 mg | INTRAVENOUS | Status: DC

## 2016-12-03 MED ORDER — ARFORMOTEROL TARTRATE 15 MCG/2ML IN NEBU
15.0000 ug | INHALATION_SOLUTION | Freq: Two times a day (BID) | RESPIRATORY_TRACT | Status: DC
  Administered 2016-12-03 – 2016-12-11 (×15): 15 ug via RESPIRATORY_TRACT
  Filled 2016-12-03 (×16): qty 2

## 2016-12-03 MED ORDER — ASPIRIN EC 81 MG PO TBEC: 81.0000 mg | DELAYED_RELEASE_TABLET | Freq: Every day | ORAL | 0 refills | Status: DC

## 2016-12-03 MED ORDER — MAGNESIUM SULFATE 2 GM/50ML IV SOLN
2.0000 g | Freq: Once | INTRAVENOUS | Status: AC
  Administered 2016-12-03: 2 g via INTRAVENOUS
  Filled 2016-12-03: qty 50

## 2016-12-03 MED ORDER — ALLOPURINOL 100 MG PO TABS
300.0000 mg | ORAL_TABLET | Freq: Every day | ORAL | Status: DC
  Administered 2016-12-04 – 2016-12-11 (×8): 300 mg via ORAL
  Filled 2016-12-03 (×9): qty 3

## 2016-12-03 MED ORDER — PIPERACILLIN-TAZOBACTAM 3.375 G IVPB: 3.3750 g | Freq: Three times a day (TID) | INTRAVENOUS | Status: DC

## 2016-12-03 MED ORDER — SENNOSIDES-DOCUSATE SODIUM 8.6-50 MG PO TABS: 1.0000 | ORAL_TABLET | Freq: Every evening | ORAL | 0 refills | Status: DC | PRN

## 2016-12-03 MED ORDER — PIPERACILLIN-TAZOBACTAM 3.375 G IVPB
3.3750 g | Freq: Three times a day (TID) | INTRAVENOUS | Status: AC
  Administered 2016-12-03 – 2016-12-04 (×4): 3.375 g via INTRAVENOUS
  Filled 2016-12-03 (×5): qty 50

## 2016-12-03 MED ORDER — POTASSIUM CHLORIDE 10 MEQ/100ML IV SOLN
10.0000 meq | INTRAVENOUS | Status: AC
  Administered 2016-12-03 (×4): 10 meq via INTRAVENOUS
  Filled 2016-12-03 (×4): qty 100

## 2016-12-03 MED ORDER — ATORVASTATIN CALCIUM 40 MG PO TABS
40.0000 mg | ORAL_TABLET | Freq: Every day | ORAL | Status: DC
  Administered 2016-12-03 – 2016-12-10 (×8): 40 mg via ORAL
  Filled 2016-12-03 (×8): qty 1

## 2016-12-03 MED ORDER — NITROGLYCERIN 0.4 MG SL SUBL
0.4000 mg | SUBLINGUAL_TABLET | SUBLINGUAL | Status: DC | PRN
  Administered 2016-12-04: 0.4 mg via SUBLINGUAL
  Filled 2016-12-03: qty 1

## 2016-12-03 MED ORDER — ALBUTEROL SULFATE (2.5 MG/3ML) 0.083% IN NEBU
2.5000 mg | INHALATION_SOLUTION | RESPIRATORY_TRACT | Status: DC | PRN
  Filled 2016-12-03: qty 3

## 2016-12-03 MED ORDER — VANCOMYCIN HCL IN DEXTROSE 1-5 GM/200ML-% IV SOLN
1000.0000 mg | INTRAVENOUS | Status: AC
  Administered 2016-12-04: 1000 mg via INTRAVENOUS
  Filled 2016-12-03: qty 200

## 2016-12-03 MED ORDER — ACETAMINOPHEN 650 MG RE SUPP: 650.0000 mg | RECTAL | Status: DC | PRN

## 2016-12-03 MED ORDER — PANTOPRAZOLE SODIUM 40 MG PO TBEC
40.0000 mg | DELAYED_RELEASE_TABLET | Freq: Every day | ORAL | Status: DC
  Administered 2016-12-04 – 2016-12-06 (×3): 40 mg via ORAL
  Filled 2016-12-03 (×4): qty 1

## 2016-12-03 MED ORDER — IPRATROPIUM-ALBUTEROL 0.5-2.5 (3) MG/3ML IN SOLN: 3.0000 mL | Freq: Four times a day (QID) | RESPIRATORY_TRACT | Status: DC | PRN

## 2016-12-03 MED ORDER — PREDNISONE 10 MG PO TABS: ORAL_TABLET | ORAL | 0 refills | Status: DC

## 2016-12-03 MED ORDER — PREDNISONE 20 MG PO TABS
40.0000 mg | ORAL_TABLET | Freq: Every day | ORAL | Status: DC
  Administered 2016-12-04 – 2016-12-07 (×4): 40 mg via ORAL
  Filled 2016-12-03 (×4): qty 2

## 2016-12-03 NOTE — Care Management Note (Signed)
Case Management Note  Patient Details  Name: [name] MRN: [id number] Date of Birth: 1926-03-21  Subjective/Objective:    Pt admitted with acute aphasia and R sided weakness. 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  PTA, pt resided at home with spouse.  He has supportive daughter.                   Action/Plan: PT/OT recommending CIR and consult in progress.  Hopeful for admission to CIR this week.    Expected Discharge Date:  12/03/16               Expected Discharge Plan:  IP Rehab Facility  In-House Referral:  Clinical Social Work  Discharge planning Services  CM Consult  Post Acute Care Choice:    Choice offered to:     DME Arranged:    DME Agency:     HH Arranged:    HH Agency:     Status of Service:  Completed, signed off  If discussed at [employer] [facility] [employer], dates discussed:    Additional Comments:  12/02/16 J. Astrid Drafts, RN, BSN Daughter given information on [facility] and private duty caregivers, as she has concerns about caring for her parents at dc.   12/03/16 J. Meklit Cotta, RN, BSN Pt has been accepted and insurance authorization received for admission to [facility] later today.     Quintella Baton, RN, BSN  Trauma/Neuro ICU Case Manager [phone]

## 2016-12-03 NOTE — Progress Notes (Signed)
0800 Chest PT held. Pt asleep. Checked on patient x3. Will continue to monitor.

## 2016-12-03 NOTE — Progress Notes (Signed)
Rehab admissions - I have approval for acute inpatient rehab admission for today.  I spoke with Dr. Roda Shutters who says patient is stable for rehab from his perspective.  Will await confirmation from attending MD, Dr. Allena Katz.  I do have a rehab bed available today.  Call me for questions.  #[phone]

## 2016-12-03 NOTE — Plan of Care (Signed)
Problem: Nutrition: Goal: Adequate nutrition will be maintained Outcome: Progressing Dys 1 diet, clear liquids; poor appetite

## 2016-12-03 NOTE — PMR Pre-admission (Signed)
PMR Admission Coordinator Pre-Admission Assessment  Patient: [name] is an 83 y.o., male MRN: [id number] DOB: 1926-05-24 Height: '5\' 5"'  (165.1 cm) Weight: 63.9 kg (140 lb 14 oz)             Insurance Information HMO:  Yes    PPO:       PCP:       IPA:       80/20:       OTHER:  Group # [id number] PRIMARY: UHC Medicare      Policy#: [id number]      Subscriber: patient CM Name: Vevelyn Royals      Phone#: [phone]     Fax#: [phone] Pre-Cert#:  [id number] with weekly updates      Employer:  Retired Benefits:  Phone #: [phone]     Name:  Online Eff. Date: 02/11/16     Deduct:  $0      Out of Pocket Max: [phone] (met $0)      Life Max: N/A CIR: $0 days 1-90      SNF: $0 days 1-20; $167 days 21-100 (no copay if QMB) Outpatient: medical necessity     Co-Pay: None Home Health: 100%      Co-Pay: none DME: 80%     Co-Pay: 20% Providers: in network  SECONDARY: Medicaid of [place]      Policy#: [id number]      Subscriber: patient CM Name:        Phone#:       Fax#:   Pre-Cert#:        Employer:  Retired Dealer:  Phone #: [phone]     Name:  Automated Eff. Date: Eligible 12/01/16 with coverage code MAAQN     Deduct:        Out of Pocket Max:        Life Max:   CIR:        SNF:   Outpatient:       Co-Pay:   Home Health:        Co-Pay:   DME:       Co-Pay:    Emergency Contact Information Contact Information    Name Relation Home Work [place] Daughter [phone]     [name] Relative   [phone]     Current Medical History  Patient Admitting Diagnosis: L MCA CVA   History of Present Illness: An 83 y.o.right handednon-English speaking malewith history of multiple strokes most recent 10/21/2017 as well as atrial fibrillation maintained on xarelto, diabetes mellitus, CKD stage III and diastolic congestive heart failure. Per chart review and daughter, patient lives with spouse. He plans to return to his daughter's home. Used a single-point cane and standard  walker prior to admission. One level home with 3 steps to entry. Presented 11/27/2016 with acute onset of aphasia and right-sided weakness. CT reviewed, showing frontotemporal lobe infarct. Per report, CT/MRI showed acute lateral left frontal lobe infarction. Extensive subacute and chronic ischemia including a large subacute left temporal lobe infarct new as compared to 10/18/2016. Small volume subarachnoid hemorrhage in the left perimesencephalic cistern . EEG evidence of mild to moderate generalized background slowing consistent with a bi-hemisphere malfunction. Recent echocardiogram with ejection fraction of 55% no wall motion abnormalities. Left common carotid arteriogram showed occluded left MCA and underwent revascularization per interventional radiology. Patient needed ventilatory support for a short time. Bouts of hypokalemia with potassium supplement added.  Chest x-ray 10/19 showed persistent right lower lobe airspace disease concerning for pneumonia currently  maintained on broad antibiotic therapy. Currently on dysphagia #1 thin liquid diet with follow-up modified barium swallow completed 12/01/2016. MRSA PCR screen positive maintained on contact cautions Physical and occupational therapy evaluations completed with recommendations of physical medicine rehabilitation consult. Patient to be admitted for a comprehensive inpatient rehabilitation program.     Total: 14=NIH  Past Medical History  Past Medical History:  Diagnosis Date  . AAA (abdominal aortic aneurysm) ([place])   . Anemia   . Asthma   . Carotid artery disease ([place])   . CHF (congestive heart failure) ([place])   . Coronary artery disease   . Depression   . Diabetes mellitus type 2 in nonobese ([facility])   . Gout   . H. pylori infection   . Hard of hearing   . Hiatal hernia   . Hyperplasia, prostate   . Hypertension   . PUD (peptic ulcer disease)   . Renal failure, acute ([place]) 11/08/2012    Family History  family history is not on  file.  Prior Rehab/Hospitalizations: Had [facility] and RN through [facility] most recently   Has the patient had major surgery during 100 days prior to admission? No  Current Medications   Current Facility-Administered Medications:  .  acetaminophen (TYLENOL) tablet 650 mg, 650 mg, Oral, Q4H PRN, 650 mg at 12/03/16 1203 **OR** [DISCONTINUED] acetaminophen (TYLENOL) solution 650 mg, 650 mg, Per Tube, Q4H PRN **OR** acetaminophen (TYLENOL) suppository 650 mg, 650 mg, Rectal, Q4H PRN, Luanne Bras, MD, 650 mg at 11/28/16 0108 .  albuterol (PROVENTIL) (2.5 MG/3ML) 0.083% nebulizer solution 2.5 mg, 2.5 mg, Nebulization, Q2H PRN, Hammonds, Sharyn Blitz, MD, 2.5 mg at 11/29/16 1251 .  allopurinol (ZYLOPRIM) tablet 300 mg, 300 mg, Oral, Daily, Kerney Elbe, MD, 300 mg at 12/03/16 1045 .  arformoterol (BROVANA) nebulizer solution 15 mcg, 15 mcg, Nebulization, BID, Kerney Elbe, MD, 15 mcg at 12/03/16 1142 .  atorvastatin (LIPITOR) tablet 40 mg, 40 mg, Oral, q1800, Kerney Elbe, MD, 40 mg at 12/02/16 1716 .  budesonide (PULMICORT) nebulizer solution 0.5 mg, 0.5 mg, Nebulization, BID, Kerney Elbe, MD, 0.5 mg at 12/03/16 1145 .  carvedilol (COREG) tablet 6.25 mg, 6.25 mg, Oral, BID WC, Whiteheart, Kathryn A, NP, 6.25 mg at 12/03/16 0840 .  chlorhexidine gluconate (MEDLINE KIT) (PERIDEX) 0.12 % solution 15 mL, 15 mL, Mouth Rinse, BID, Garvin Fila, MD, 15 mL at 12/03/16 0845 .  HYDROcodone-acetaminophen (NORCO/VICODIN) 5-325 MG per tablet 1 tablet, 1 tablet, Oral, Q6H PRN, Mauri Brooklyn, MD, 1 tablet at 11/30/16 1945 .  ipratropium-albuterol (DUONEB) 0.5-2.5 (3) MG/3ML nebulizer solution 3 mL, 3 mL, Nebulization, Q6H PRN, Halford Chessman, Vineet, MD .  MEDLINE mouth rinse, 15 mL, Mouth Rinse, QID, Garvin Fila, MD, 15 mL at 12/03/16 1153 .  metoprolol tartrate (LOPRESSOR) injection 2.5-5 mg, 2.5-5 mg, Intravenous, Q3H PRN, Desai, Rahul P, PA-C .  nitroGLYCERIN (NITROSTAT) SL tablet 0.4 mg, 0.4 mg, Sublingual,  Q5 min PRN, Kerney Elbe, MD .  nystatin (MYCOSTATIN) 100000 UNIT/ML suspension 300,000 Units, 3 mL, Oral, TID AC & HS, Kerney Elbe, MD, 300,000 Units at 12/03/16 1153 .  ondansetron (ZOFRAN) injection 4 mg, 4 mg, Intravenous, Q6H PRN, Deveshwar, Sanjeev, MD .  pantoprazole (PROTONIX) EC tablet 40 mg, 40 mg, Oral, Daily, Rosalin Hawking, MD, 40 mg at 12/03/16 1045 .  piperacillin-tazobactam (ZOSYN) IVPB 3.375 g, 3.375 g, Intravenous, Q8H, Chesley Mires, MD, Stopped at 12/03/16 1006 .  predniSONE (DELTASONE) tablet 40 mg, 40 mg, Oral, Q breakfast, Corey Harold, NP, 40  mg at 12/03/16 0840 .  senna-docusate (Senokot-S) tablet 1 tablet, 1 tablet, Oral, QHS PRN, Kerney Elbe, MD .  vancomycin (VANCOCIN) IVPB 1000 mg/200 mL premix, 1,000 mg, Intravenous, Q24H, Chesley Mires, MD, Stopped at 12/03/16 1300  Patients Current Diet: DIET - DYS 1 Room service appropriate? Yes; Fluid consistency: Thin DIET - DYS 1  Precautions / Restrictions Precautions Precautions: Fall Precaution Comments: daughter present and translated  Restrictions Weight Bearing Restrictions: No   Has the patient had 2 or more falls or a fall with injury in the past year?No  Prior Activity Level Household: Stays at home most of the time.  Does go out on the porch.  Goes to MD appointments occasionally.  Home Assistive Devices / Equipment Home Equipment: Cane - single point, Environmental consultant - standard  Prior Device Use: Indicate devices/aids used by the patient prior to current illness, exacerbation or injury? Walker and [employer]  Prior Functional Level Prior Function Level of Independence: Needs assistance Comments: no family available to provide information about pt's level of functioning since his recent stroke  Self Care: Did the patient need help bathing, dressing, using the toilet or eating?  Independent  Indoor Mobility: Did the patient need assistance with walking from room to room (with or without device)?  Independent  Stairs: Did the patient need assistance with internal or external stairs (with or without device)? Independent  Functional Cognition: Did the patient need help planning regular tasks such as shopping or remembering to take medications? Independent  Current Functional Level Cognition  Arousal/Alertness: Awake/alert Overall Cognitive Status: Impaired/Different from baseline Difficult to assess due to: Non-English speaking Current Attention Level: Sustained Orientation Level: Oriented to person Following Commands: Follows one step commands inconsistently, Follows one step commands with increased time Safety/Judgement: Decreased awareness of safety, Decreased awareness of deficits (R sided neglect) General Comments: Pt will follow gestural commands inconsistently.  he is able to sustain attention for 30-60 seconds.   Difficult to accurately assess cognition due to severity of language deficits  Attention: Focused, Sustained Focused Attention: Appears intact Sustained Attention: Appears intact    Extremity Assessment (includes Sensation/Coordination)  Upper Extremity Assessment: Defer to OT evaluation RUE Deficits / Details: FF to 45 degrees as if reaching, unable to formally test due to cognition/language barrier RUE Coordination: decreased fine motor, decreased gross motor LUE Deficits / Details: FF to 45 degrees as if reaching, unable to formally assess due to cognition/language barrier LUE Coordination: decreased fine motor, decreased gross motor  Lower Extremity Assessment: RLE deficits/detail, LLE deficits/detail RLE Deficits / Details: lifting some, but not antigravity and at rest pushed into extension LLE Deficits / Details: lifting in bed antigravity, not following commands for formal testing    ADLs  Overall ADL's : Needs assistance/impaired Eating/Feeding: Sitting, Maximal assistance Eating/Feeding Details (indicate cue type and reason): Pt requires hand over hand  assist, but with repetition will bring spoon to mouth without assist  Grooming: Wash/dry face, Sitting, Moderate assistance Grooming Details (indicate cue type and reason): Initially required hand over hand assist, but with repetition, and gestures, he was able to perform with min - mod A.  Initially attempted to chew on washcloth  Lower Body Dressing: Maximal assistance, Sit to/from stand Lower Body Dressing Details (indicate cue type and reason): with gestural cues and repetition, pt was able to don Lt sock with min A, and mod A for Rt sock.  He repeatedly attempts to don Rt sock on Lt foot despite Lt foot having sock on it  General ADL Comments: currently requiring total assist    Mobility  Overal bed mobility: Needs Assistance Bed Mobility: Supine to Sit Supine to sit: Min assist Sit to supine: +2 for physical assistance, Total assist General bed mobility comments: sitting in chair     Transfers  Overall transfer level: Needs assistance Equipment used: 1 person hand held assist Transfers: Sit to/from Stand Sit to Stand: Mod assist, Max assist, +2 physical assistance General transfer comment: max tactile and verbal cues    Ambulation / Gait / Stairs / Wheelchair Mobility  Ambulation/Gait Ambulation/Gait assistance: Max assist, +2 physical assistance (3rd person for chair follow and lines) Ambulation Distance (Feet): 165 Feet Assistive device: 2 person hand held assist Gait Pattern/deviations: Step-through pattern, Trunk flexed, Narrow base of support General Gait Details: pt unable to step to command but once pt provided with forward facilitation pt able to instinctively step. maxA to promote trunk extension and look forward. pt with narrow base of supoort and R lateral lean Gait velocity: slow Gait velocity interpretation: Below normal speed for age/gender    Posture / Balance Dynamic Sitting Balance Sitting balance - Comments: Pt requires close min guard assist to lean forward in  chair to don socks  Balance Overall balance assessment: Needs assistance Sitting-balance support: Feet supported Sitting balance-Leahy Scale: Fair Sitting balance - Comments: Pt requires close min guard assist to lean forward in chair to don socks  Postural control: Posterior lean Standing balance support: Bilateral upper extremity supported Standing balance-Leahy Scale: Poor Standing balance comment: dependent on physical assist    Special needs/care consideration BiPAP/CPAP No CPM No Continuous Drip IV KVO Dialysis No       Life Vest No Oxygen Yes, used 02 2L [place] at night, rarely used during the day. Special Bed No Trach Size No Wound Vac (area) No  Skin Bruises from IVs and blood draws                            Bowel mgmt: Last BM 12/02/16 Bladder mgmt: External catheter, incontinence Diabetic mgmt No, but has received insulin in the hospital    Previous Home Environment Living Arrangements: Spouse/significant other  Lives With: Family Available Help at Discharge: Family, Available 24 hours/day Type of Home: House Home Layout: One level Home Access: Stairs to enter Entrance Stairs-Rails: None Entrance Stairs-Number of Steps: 3 Bathroom Shower/Tub: Tub only Biochemist, clinical: Standard Additional Comments: information taken from previous admittion, pt currently intubated  Discharge Living Setting Plans for Discharge Living Setting: Patient's home, House, Lives with (comment) (Lives with wife, daughters, son and grandchildren.) Type of Home at Discharge: House Discharge Home Layout: One level Discharge Home Access: Stairs to enter Technical brewer of Steps: 2  Social/Family/Support Systems Patient Roles: Spouse, Parent, Other (Comment) (Has a wife, 2 sons, a daughter, 6 grandchildren.) Contact Information: [name] - daughter - speaks [place] and [place] Anticipated Caregiver: daughter and wife and family Anticipated Caregiver's Contact Information: Ernestine Mcmurray -  daughter - [phone] Ability/Limitations of Caregiver: Daughter does not work and can assist.  Family stays with patient most of the time. Caregiver Availability: 24/7 Discharge Plan Discussed with Primary Caregiver: Yes Is Caregiver In Agreement with Plan?: Yes Does Caregiver/Family have Issues with Lodging/Transportation while Pt is in Rehab?: No  Goals/Additional Needs Patient/Family Goal for Rehab: PT/OT/SLP min to mod assist goals Expected length of stay: 23-27 days Cultural Considerations: Buddist, speaks [place], have been using daughter to translate because we can  not get interpreters for this patient. Dietary Needs: Dys 1, thin liquids Equipment Needs: TBD Pt/Family Agrees to Admission and willing to participate: Yes Program Orientation Provided & Reviewed with Pt/Caregiver Including Roles  & Responsibilities: Yes  Decrease burden of Care through IP rehab admission: N/A  Possible need for SNF placement upon discharge: Not planned  Patient Condition: This patient's medical and functional status has changed since the consult dated: 12/01/16 in which the Rehabilitation Physician determined and documented that the patient's condition is appropriate for intensive rehabilitative care in an inpatient rehabilitation facility. See "History of Present Illness" (above) for medical update. Functional changes are: Currently requiring max assist +2 to ambulate 165 feet +2 HHA. Patient's medical and functional status update has been discussed with the Rehabilitation physician and patient remains appropriate for inpatient rehabilitation. Will admit to inpatient rehab today.  Preadmission Screen Completed By:  Retta Diones, 12/03/2016 1:48 PM ______________________________________________________________________   Discussed status with Dr. Posey Pronto on 12/03/16 at 42 and received telephone approval for admission today.  Admission Coordinator:  Retta Diones, time 1347/Date 12/03/16

## 2016-12-03 NOTE — Progress Notes (Signed)
eLink Physician-Brief Progress Note Patient Name: Tenzin Dickel DOB: [birth year] MRN: [id number]   Date of Service  12/03/2016  HPI/Events of Note  K+ = 3.2, Mg++ = 1.6 and Creatinine = 1.45.   eICU Interventions  Will replace Mg++ and K+.     Intervention Category Major Interventions: Electrolyte abnormality - evaluation and management  Sommer,Steven Eugene 12/03/2016, 6:19 AM

## 2016-12-03 NOTE — Progress Notes (Signed)
STROKE TEAM PROGRESS NOTE   SUBJECTIVE (INTERVAL HISTORY) RN is at bedside, family not seen this am. Pt is awak alert,  SOB with wheezing much improved, on Room Air. RN reported good sleep last night, but still trying to get out of bed this morning. On soft restraint. RN reported some improvement of aphasia according to the communication with daughter yesterday.    OBJECTIVE Temp:  [97.4 F (36.3 C)-98.7 F (37.1 C)] 97.5 F (36.4 C) (10/24 0800) Pulse Rate:  [65-119] 80 (10/24 0900) Cardiac Rhythm: Atrial fibrillation (10/24 0800) Resp:  [14-38] 26 (10/24 0900) BP: (112-167)/(73-127) 167/111 (10/24 0900) SpO2:  [89 %-100 %] 96 % (10/24 0900)  CBC:   Recent Labs Lab 12/01/16 0355 12/03/16 0222  WBC 11.8* 8.3  NEUTROABS 11.4* 7.6  HGB 7.8* 8.3*  HCT 23.0* 24.7*  MCV 90.6 91.5  PLT 168 175    Basic Metabolic Panel:   Recent Labs Lab 12/01/16 0355 12/01/16 1858 12/03/16 0222  NA 129* 134* 132*  K 2.8* 3.7 3.2*  CL 100* 104 97*  CO2 19* 20* 24  GLUCOSE 224* 87 217*  BUN 23* 24* 25*  CREATININE 1.56* 1.55* 1.45*  CALCIUM 8.4* 8.7* 8.5*  MG 1.7  --  1.6*  PHOS 3.4  --  3.4    Lipid Panel:     Component Value Date/Time   CHOL 89 11/28/2016 0442   CHOL 122 09/09/2013 0815   TRIG 52 11/30/2016 0515   HDL 28 (L) 11/28/2016 0442   HDL 35 (L) 09/09/2013 0815   CHOLHDL 3.2 11/28/2016 0442   VLDL 20 11/28/2016 0442   LDLCALC 41 11/28/2016 0442   LDLCALC 50 09/09/2013 0815   HgbA1c:  Lab Results  Component Value Date   HGBA1C 6.0 (H) 11/28/2016   Urine Drug Screen:     Component Value Date/Time   LABOPIA NONE DETECTED 10/08/2016 1937   COCAINSCRNUR NONE DETECTED 10/08/2016 1937   LABBENZ NONE DETECTED 10/08/2016 1937   AMPHETMU NONE DETECTED 10/08/2016 1937   THCU NONE DETECTED 10/08/2016 1937   LABBARB NONE DETECTED 10/08/2016 1937    Alcohol Level     Component Value Date/Time   ETH <5 10/09/2016 0100    IMAGING I have personally reviewed the  radiological images below and agree with the radiology interpretations.  Ct Angio Head W Or Wo Contrast Ct Angio Neck W Or Wo Contrast Ct Cerebral Perfusion W Contrast 11/27/2016 1. Left middle cerebral artery proximal M2 inferior division occlusion. Poor collateral circulation in the left posterior MCA distribution. 2. CT perfusion demonstrating left posterior MCA distribution core infarct with volume of 48 cc and ischemic penumbra of 81 cc by automated RAPID post processing. 3. Patent carotid and vertebral arteries of the neck. No high-grade stenosis by NASCET criteria, aneurysm, or dissection. 4. Stented left distal common carotid artery to proximal left internal carotid artery. The stent appears widely patent. 5. 4 mm rounded dilation of left posterior communicating artery ICA origin compatible with aneurysm. 6. Left V4 segment focal ectasia measuring 6 mm with ulcerated mural plaque. 7. 4.2 cm ascending aortic aneurysm. Recommend annual imaging followup by CTA or MRA. This recommendation follows 2010 ACCF/AHA/AATS/ACR/ASA/SCA/SCAI/SIR/STS/SVM Guidelines for the Diagnosis and Management of Patients with Thoracic Aortic Disease. Circulation. 2010; 121: N629-B284. 8. Moderate to severe mixed plaque of the aorta with multiple plaque ulcerations.  Ct Head Wo Contrast 11/26/2016 1. New foci of hypoattenuation within the left parietal cortex and left caudate head compatible with interval late acute/early subacute  infarction from 10/21/2016. Subcentimeter focus of hemorrhage within the new left caudate head infarction. No significant mass effect. 2. Hemorrhagic conversion of left parietooccipital infarction. No significant mass effect or extra-axial collection. 3. Multiple chronic infarcts as described are stable. 4. Hyperdense distal left MCA in the MCA cistern may represent thrombus. 11/27/2016 1. Enhancement of left caudate head, scattered foci of the left frontal parietal lobes, and left  lateral temporal lobe areas of acute infarction correspond to infarct core on the prior CT perfusion. No new area of acute infarction identified at this time. No significant mass effect. 2. Increased density within the left temporoparietal lobe hemorrhagic infarction probably represents superimposed enhancement. Evaluation for new hemorrhage is limited in the presence of contrast. 3. Soft tissue thickening of right external auditory canal, correlate for possible otitis externa. No bony destructive changes. 11/27/2016 1. Increased subarachnoid contrast/hemorrhage in the lower left sylvian fissure and perimesencephalic cistern, overall small volume. 2. Contrast staining in the infarcted left temporoparietal cortex after recent procedure. 3. Petechial hemorrhage in subacute infarcts in the left caudate head and left parietooccipital cortex as seen on admission noncontrast head CT. 4. Moderate acute lateral left frontal infarct.  Mr Brain Wo Contrast 11/27/2016 1. Acute left lateral frontal lobe infarct. 2. Extensive subacute and chronic ischemia as above, including a large subacute left temporal lobe infarct which is new from 10/18/2016. Associated petechial hemorrhage. 3. Small volume subarachnoid hemorrhage in the left perimesencephalic cistern.  Portable Chest Xray 11/27/2016 1. Endotracheal tube tip noted 2.7 cm above the carina. 2. Cardiomegaly with normal pulmonary vascularity. 3. Low lung volumes with mild bilateral atelectatic changes . 11/28/2016 1. Support apparatus, as above. 2. New ill-defined opacities in the base of the right lung concerning for developing airspace consolidation, favored to be in the right lower lobe. 3. Probable small right pleural effusion. 4. Aortic atherosclerosis. 11/30/16 1. Persistent right lower lobe airspace disease concerning for pneumonia. Followup PA and lateral chest X-ray is recommended in 3-4 weeks following trial of antibiotic therapy to ensure  resolution and exclude underlying malignancy. 12/02/16  1. Cardiomegaly with bilateral from interstitial prominence suggesting CHF. 2. Low lung volumes with bibasilar atelectasis and infiltrates/edema. No interim change .  PHYSICAL EXAM  Temp:  [97.4 F (36.3 C)-98.7 F (37.1 C)] 97.5 F (36.4 C) (10/24 0800) Pulse Rate:  [65-119] 80 (10/24 0900) Resp:  [14-38] 26 (10/24 0900) BP: (112-167)/(73-127) 167/111 (10/24 0900) SpO2:  [89 %-100 %] 96 % (10/24 0900)  General - awake, alert, following pantomime but not verbal commands likely language barrier  Cardiovascular - Irregular rhythm with normal rate.  Mental Status -  level of consciousness awake and moving. Global aphasia. Suspect partial right neglect.   Cranial Nerves II - XII - II - Visual field intact to threats b/l. III, IV, VI - Extraocular movements intact VII - Facial movement intact bilaterally. VIII - Hearing grossly intact XII - Tongue protrusion intact.  Motor Strength - Strength was symmetrical BUEs. LLE 5/5. RLE 2+/5.  Motor Tone - Muscle tone was normal  Reflexes - The patient's reflexes were 1+ with negative babinski.  Sensory - Not assessed  Coordination - No appreciable dysmetria or tremor but limited exam.  Gait and Station - Not assessed   ASSESSMENT/PLAN [name] is a 83 y.o. male with history of multiple strokes, atrial fibrillation, hypertension, CAD, CHF presenting with aphasia and right sided weakness. He did not receive IV t-PA due to anticoagulation but underwent endovascular clot retraction.   Stroke:  left MCA embolic secondary to proximal M2  Occlusion s/p thrombectomy with TICI3 revascularization  Resultant  RLE weakness, aphasia  CT head New foci of hypoattenuation within the left parietal cortex and left caudate head compatible with interval late acute/early subacute infarction from 10/21/2016.  MRI head Acute left lateral frontal lobe infarct with associated  petechial hemorrhage.  CTA head Left middle cerebral artery proximal M2 inferior division occlusion.  CTA Neck stented left distal common carotid artery to proximal left internal carotid artery  IR - s/p left M2 revascularization with TICI3, left ICA stent only 30% stenosis  2D Echo  From 10/19/2016 LVEF 50-55%, no embolic source  LDL 41  HgbA1c 6.0%  SCDs for VTE prophylaxis DIET - DYS 1 Room service appropriate? Yes; Fluid consistency: Thin  Xarelto (rivaroxaban) daily prior to admission, now on No antithrombotic. No AC at this time due to risk of hemorrhagic transformation. If pt stable and no planned procedure, plan to start coumadin this weekend with INR goal 2.5-3.5 to help for stroke prevention.   Ongoing aggressive stroke risk factor management  Therapy recommendations:  CIR  Disposition:  Pending  Hx of embolic strokes    10/18/16 acute left MCA infarct, left thalamic and left ACA/PCA at high convexity infarcts - CTA head and neck left ICA stent 70% stenosis and right M1 high grade stenosis. EF 50-55% improved. LDL 45 and A1C 6.0. His AC was on hold during admission but later changed to Xarelto and ASA as outpt.  10/09/16 acute right PLIC infarct - small vessel disease - right M1 75% stenosis - EF 45-50% - LDL 50 and A1C 6.4 - continued on pradaxa and ASA  07/2016 - right MCA infarct - right M1 75% stenosis, left ICA stent 70% stenosis - EF 45-50% - LDL 42 and A1C 6.4 - Xarelto changed to pradaxa  04/2013 - multifocal cardioembolic infarcts - new diagnosed afib - EF 35% - put on eliquis 2.5mg  bid in 08/2013 - however, pt was on Xarelto 10/2013 as per chart  Respiratory distress, improving  Extubated, on nasal cannula now  Not tolerate BiPAP  CXR showed RLL pneumonia - on vanco and zosyn  SOB and wheezing, improved on RA  On solumedrol, CPT, duo nebs, and pulmicort nebulization  Afib, chronic  Rate controlled now   Off cardizem, on coreg   If pt stable and no  planned procedure, plan to start coumadin this weekend with INR goal 2.5-3.5 to help for stroke prevention.  Dysphagia   On dysphagia 1 and thin liquid  Appreciate speech assistance  Resume po meds  Hypertension  Stable On low-dose Coreg Long-term BP goal normotensive   Hyperlipidemia  Home meds:  Atorvastatin 40mg , resumed in hospital  LDL 41, goal < 70  Lipitor resumed  Other Stroke Risk Factors  Advanced age   Coronary artery disease  Other active problems  AKI - Cre 1.46->1.30->1.46->1.56->1.55  [facility]   Hospital day # 6  Marvel PlanJindong Melaya Hoselton, MD PhD Stroke Neurology 12/03/2016 9:53 AM   To contact Stroke Continuity provider, please refer to [email or web address]. After hours, contact General Neurology

## 2016-12-03 NOTE — H&P (Signed)
Physical Medicine and Rehabilitation Admission H&P    Chief Complaint  Patient presents with  . Aphasia  : TDH:RCBULA [name] is a 83 y.o. right handed non-English speaking male with history of multiple strokes most recent 10/21/2017 as well as atrial fibrillation maintained on xarelto, diabetes mellitus, CKD stage III and diastolic congestive heart failure. Per chart review and daughter, patient lives with spouse. He plans to return to his daughter's home. Used a single-point cane and standard walker prior to admission. One level home with 3 steps to entry. Presented 11/27/2016 with acute onset of aphasia and right-sided weakness. CT reviewed, showing frontotemporal lobe infarct.  Per report, CT/MRI showed acute lateral left frontal lobe infarction. Extensive subacute and chronic ischemia including a large subacute left temporal lobe infarct new as compared to 10/18/2016. Small volume subarachnoid hemorrhage in the left perimesencephalic cistern . EEG evidence of mild to moderate generalized background slowing consistent with a bi-hemisphere malfunction. Recent echocardiogram with ejection fraction of 55% no wall motion abnormalities. Left common carotid arteriogram showed occluded left MCA and underwent revascularization per interventional radiology. Patient needed ventilatory support for a short time. Plan is to begin Coumadin 12/06/2016 with goal INR of 2.5-3.5 Bouts of hypokalemia with potassium supplement added.  Chest x-ray 10/19 showed persistent right lower lobe airspace disease concerning for pneumonia currently maintained on broad antibiotic therapy with vancomycin and Zosyn as well as prednisone therapy with taper. Currently on dysphagia #1 thin liquid diet with follow-up modified barium swallow completed 12/01/2016. MRSA PCR screen positive maintained on contact cautions Physical and occupational therapy evaluations completed with recommendations of physical medicine rehabilitation  consult. Patient was admitted for a comprehensive rehabilitation program   Review of Systems  Unable to perform ROS: Language   Past Medical History:  Diagnosis Date  . AAA (abdominal aortic aneurysm) ([place])   . Anemia   . Asthma   . Carotid artery disease ([place])   . CHF (congestive heart failure) ([place])   . Coronary artery disease   . Depression   . Diabetes mellitus type 2 in nonobese ([facility])   . Gout   . H. pylori infection   . Hard of hearing   . Hiatal hernia   . Hyperplasia, prostate   . Hypertension   . PUD (peptic ulcer disease)   . Renal failure, acute ([place]) 11/08/2012   Past Surgical History:  Procedure Laterality Date  . CAROTID STENT INSERTION  2015  . CAROTID STENT INSERTION N/A 05/09/2013   Procedure: CAROTID STENT INSERTION;  Surgeon: Jonathon Harp, MD;  Location: [facility] CATH LAB;  Service: Cardiovascular;  Laterality: N/A;  . ESOPHAGOGASTRODUODENOSCOPY  06/13/2008,12/31/12  . IR PERCUTANEOUS ART THROMBECTOMY/INFUSION INTRACRANIAL INC DIAG ANGIO  11/27/2016  . LEFT HEART CATHETERIZATION WITH CORONARY ANGIOGRAM N/A 05/04/2013   Procedure: LEFT HEART CATHETERIZATION WITH CORONARY ANGIOGRAM;  Surgeon: Jonathon M Martinique, MD;  Location: [facility] CATH LAB;  Service: Cardiovascular;  Laterality: N/A;  . RADIOLOGY WITH ANESTHESIA N/A 11/27/2016   Procedure: IR WITH ANESTHESIA;  Surgeon: Jonathon Bras, MD;  Location: [place];  Service: Radiology;  Laterality: N/A;   Family History  Problem Relation Age of Onset  . Colon cancer Neg Hx    Social History:  reports that he quit smoking about 14 years ago. He has never used smokeless tobacco. He reports that he does not drink alcohol or use drugs. Allergies:  Allergies  Allergen Reactions  . Levofloxacin Anaphylaxis and Other (See Comments)    Patient told his daughter that  it made him "feel worse than he already did" (overall) Headache also (has refused to take anymore)  . Eliquis [Apixaban] Itching and Swelling  . Phenazopyridine  Hcl Other (See Comments)    Unknown reaction  . Septra [Sulfamethoxazole-Trimethoprim] Nausea And Vomiting and Other (See Comments)    GI Upset  . Latex Rash  . Tape Rash   Medications Prior to Admission  Medication Sig Dispense Refill  . allopurinol (ZYLOPRIM) 300 MG tablet Take 300 mg by mouth daily. To prevent gout    . arformoterol (BROVANA) 15 MCG/2ML NEBU Take 2 mLs (15 mcg total) by nebulization 2 (two) times daily. 120 mL 2  . arformoterol (BROVANA) 15 MCG/2ML NEBU Inhale 2 mLs into the lungs 2 (two) times daily.    [name] aspirin EC 81 MG tablet Take 1 tablet (81 mg total) by mouth daily. 90 tablet 3  . atorvastatin (LIPITOR) 40 MG tablet Take 1 tablet (40 mg total) by mouth daily at 6 PM. 30 tablet 5  . budesonide (PULMICORT) 0.5 MG/2ML nebulizer solution Take 2 mLs (0.5 mg total) by nebulization 2 (two) times daily. 2 mL 12  . carvedilol (COREG) 6.25 MG tablet Take 1 tablet (6.25 mg total) by mouth 2 (two) times daily with a meal. 60 tablet 6  . esomeprazole (NEXIUM) 40 MG capsule Take 1 capsule (40 mg total) by mouth 2 (two) times daily before a meal.    . HYDROcodone-homatropine (HYCODAN) 5-1.5 MG/5ML syrup Take 5 mLs by mouth every 6 (six) hours as needed for cough.    [name] ipratropium-albuterol (DUONEB) 0.5-2.5 (3) MG/3ML SOLN Take 3 mLs by nebulization 2 (two) times daily as needed (for shortness of breath or wheezing).    . isosorbide mononitrate (IMDUR) 30 MG 24 hr tablet Take 30 mg by mouth once daily (Patient taking differently: Take 30 mg by mouth daily. For control of heart pain) 15 tablet 9  . meclizine (ANTIVERT) 25 MG tablet Take 25 mg by mouth 3 (three) times daily as needed for dizziness.     [name] NITROSTAT 0.4 MG SL tablet PLACE 1 TABLET UNDER THE TONGUE EVERY 5 MINUTES FOR 3 DOSES AS NEEDED FOR CHEST PAIN (Patient taking differently: PLACE 0.4 MG UNDER THE TONGUE EVERY 5 MINUTES FOR 3 DOSES AS NEEDED FOR CHEST PAIN) 25 tablet 5  . nystatin (MYCOSTATIN) 100000 UNIT/ML  suspension Take 3 mLs by mouth 4 (four) times daily.    . tamsulosin (FLOMAX) 0.4 MG CAPS capsule Take 0.4 mg by mouth daily. To improve bladder function    . XARELTO 15 MG TABS tablet Take 15 mg by mouth daily.     . dabigatran (PRADAXA) 75 MG CAPS capsule Take 1 capsule (75 mg total) by mouth every 12 (twelve) hours. (Patient not taking: Reported on 11/26/2016) 60 capsule 0    Drug Regimen Review  Drug regimen was reviewed and remains appropriate with no significant issues identified  Home: Home Living Family/patient expects to be discharged to:: Private residence Living Arrangements: Spouse/significant other Available Help at Discharge: Family, Available 24 hours/day Type of Home: House Home Access: Stairs to enter [employer] of Steps: 3 Entrance Stairs-Rails: None Home Layout: One level Bathroom Shower/Tub: Tub only Biochemist, clinical: Standard Home Equipment: Cane - single point, Environmental consultant - standard Additional Comments: information taken from previous admittion, pt currently intubated  Lives With: Family   Functional History: Prior Function Level of Independence: Needs assistance Comments: no family available to provide information about pt's level of functioning since his  recent stroke  Functional Status:  Mobility: Bed Mobility Overal bed mobility: Needs Assistance Bed Mobility: Supine to Sit, Sit to Supine Supine to sit: +2 for physical assistance, Max assist Sit to supine: +2 for physical assistance, Total assist General bed mobility comments: initiates supine to sit with head/trunk, requires assist for LEs to advance to hips to EOB and raise trunk Transfers Overall transfer level: Needs assistance Equipment used: 2 person hand held assist Transfers: Sit to/from Stand Sit to Stand: Max assist, +2 physical assistance General transfer comment: stood from EOB x 1, blocks knees on bed, assist to rise and balance, stood x 1 minute, pt with urinary incontinence  upon standing      ADL: ADL Overall ADL's : Needs assistance/impaired General ADL Comments: currently requiring total assist  Cognition: Cognition Overall Cognitive Status: Impaired/Different from baseline (difficult to assess) Arousal/Alertness: Awake/alert Orientation Level: Other (comment) (expressive aphasia) Attention: Focused, Sustained Focused Attention: Appears intact Sustained Attention: Appears intact Cognition Arousal/Alertness: Lethargic Behavior During Therapy: Flat affect Overall Cognitive Status: Impaired/Different from baseline (difficult to assess) General Comments: pt intubated, not [place] speaking, no family available Difficult to assess due to: Impaired communication, Intubated  Physical Exam: Blood pressure (!) 154/89, pulse 87, temperature 98.3 F (36.8 C), temperature source Axillary, resp. rate 19, height '5\' 5"'  (1.651 m), weight 63.9 kg (140 lb 14 oz), SpO2 97 %. Physical Exam  Vitals reviewed. Constitutional: He appears well-developed.  83 year old right-handed male Frail  HENT:  Head: Normocephalic and atraumatic.  Eyes: EOM are normal. Right eye exhibits no discharge. Left eye exhibits no discharge.  Neck: Normal range of motion. Neck supple. No thyromegaly present.  Cardiovascular:  Irregularly Irregular  Respiratory:  Limited inspiratory effort but clear to auscultation  GI: Soft. Bowel sounds are normal. He exhibits no distension.  Musculoskeletal: He exhibits no edema or tenderness.  Neurological: He is alert.  Unable to assess due to language, however, pt not participating in exam when translated by family either. Spontaneously moving all extremities.  Skin: Skin is warm and dry.  Psychiatric:  Unable to assess due to language    Results for orders placed or performed during the hospital encounter of 11/26/16 (from the past 48 hour(s))  Urinalysis, Routine w reflex microscopic     Status: Abnormal   Collection Time: 11/29/16  5:45  PM  Result Value Ref Range   Color, Urine YELLOW YELLOW   APPearance CLEAR CLEAR   Specific Gravity, Urine 1.015 1.005 - 1.030   pH 6.0 5.0 - 8.0   Glucose, UA NEGATIVE NEGATIVE mg/dL   Hgb urine dipstick MODERATE (A) NEGATIVE   Bilirubin Urine NEGATIVE NEGATIVE   Ketones, ur 5 (A) NEGATIVE mg/dL   Protein, ur NEGATIVE NEGATIVE mg/dL   Nitrite NEGATIVE NEGATIVE   Leukocytes, UA NEGATIVE NEGATIVE   RBC / HPF TOO NUMEROUS TO COUNT 0 - 5 RBC/hpf   WBC, UA 0-5 0 - 5 WBC/hpf   Bacteria, UA RARE (A) NONE SEEN   Squamous Epithelial / LPF 0-5 (A) NONE SEEN   Mucus PRESENT   Basic metabolic panel     Status: Abnormal   Collection Time: 11/30/16  3:28 AM  Result Value Ref Range   Sodium 133 (L) 135 - 145 mmol/L   Potassium 3.9 3.5 - 5.1 mmol/L   Chloride 104 101 - 111 mmol/L   CO2 16 (L) 22 - 32 mmol/L   Glucose, Bld 162 (H) 65 - 99 mg/dL   BUN 20 6 - 20 mg/dL  Creatinine, Ser 1.46 (H) 0.61 - 1.24 mg/dL   Calcium 8.9 8.9 - 10.3 mg/dL   GFR calc non Af Amer 41 (L) >60 mL/min   GFR calc Af Amer 48 (L) >60 mL/min    Comment: (NOTE) The eGFR has been calculated using the CKD EPI equation. This calculation has not been validated in all clinical situations. eGFR's persistently <60 mL/min signify possible Chronic Kidney Disease.    Anion gap 13 5 - 15  CBC with Differential/Platelet     Status: Abnormal   Collection Time: 11/30/16  3:28 AM  Result Value Ref Range   WBC 13.3 (H) 4.0 - 10.5 K/uL   RBC 2.95 (L) 4.22 - 5.81 MIL/uL   Hemoglobin 9.1 (L) 13.0 - 17.0 g/dL   HCT 27.0 (L) 39.0 - 52.0 %   MCV 91.5 78.0 - 100.0 fL   MCH 30.8 26.0 - 34.0 pg   MCHC 33.7 30.0 - 36.0 g/dL   RDW 15.3 11.5 - 15.5 %   Platelets 184 150 - 400 K/uL   Neutrophils Relative % 96 %   Neutro Abs 12.8 (H) 1.7 - 7.7 K/uL   Lymphocytes Relative 3 %   Lymphs Abs 0.4 (L) 0.7 - 4.0 K/uL   Monocytes Relative 1 %   Monocytes Absolute 0.1 0.1 - 1.0 K/uL   Eosinophils Relative 0 %   Eosinophils Absolute 0.0 0.0  - 0.7 K/uL   Basophils Relative 0 %   Basophils Absolute 0.0 0.0 - 0.1 K/uL  Magnesium     Status: None   Collection Time: 11/30/16  3:28 AM  Result Value Ref Range   Magnesium 1.9 1.7 - 2.4 mg/dL  Phosphorus     Status: None   Collection Time: 11/30/16  3:28 AM  Result Value Ref Range   Phosphorus 2.8 2.5 - 4.6 mg/dL  Triglycerides     Status: None   Collection Time: 11/30/16  5:15 AM  Result Value Ref Range   Triglycerides 52 <150 mg/dL  Glucose, capillary     Status: Abnormal   Collection Time: 11/30/16 12:27 PM  Result Value Ref Range   Glucose-Capillary 178 (H) 65 - 99 mg/dL  Basic metabolic panel     Status: Abnormal   Collection Time: 12/01/16  3:55 AM  Result Value Ref Range   Sodium 129 (L) 135 - 145 mmol/L   Potassium 2.8 (L) 3.5 - 5.1 mmol/L    Comment: DELTA CHECK NOTED   Chloride 100 (L) 101 - 111 mmol/L   CO2 19 (L) 22 - 32 mmol/L   Glucose, Bld 224 (H) 65 - 99 mg/dL   BUN 23 (H) 6 - 20 mg/dL   Creatinine, Ser 1.56 (H) 0.61 - 1.24 mg/dL   Calcium 8.4 (L) 8.9 - 10.3 mg/dL   GFR calc non Af Amer 38 (L) >60 mL/min   GFR calc Af Amer 44 (L) >60 mL/min    Comment: (NOTE) The eGFR has been calculated using the CKD EPI equation. This calculation has not been validated in all clinical situations. eGFR's persistently <60 mL/min signify possible Chronic Kidney Disease.    Anion gap 10 5 - 15  CBC with Differential/Platelet     Status: Abnormal   Collection Time: 12/01/16  3:55 AM  Result Value Ref Range   WBC 11.8 (H) 4.0 - 10.5 K/uL   RBC 2.54 (L) 4.22 - 5.81 MIL/uL   Hemoglobin 7.8 (L) 13.0 - 17.0 g/dL   HCT 23.0 (L) 39.0 - 52.0 %  MCV 90.6 78.0 - 100.0 fL   MCH 30.7 26.0 - 34.0 pg   MCHC 33.9 30.0 - 36.0 g/dL   RDW 15.2 11.5 - 15.5 %   Platelets 168 150 - 400 K/uL   Neutrophils Relative % 97 %   Neutro Abs 11.4 (H) 1.7 - 7.7 K/uL   Lymphocytes Relative 2 %   Lymphs Abs 0.2 (L) 0.7 - 4.0 K/uL   Monocytes Relative 1 %   Monocytes Absolute 0.1 0.1 - 1.0  K/uL   Eosinophils Relative 0 %   Eosinophils Absolute 0.0 0.0 - 0.7 K/uL   Basophils Relative 0 %   Basophils Absolute 0.0 0.0 - 0.1 K/uL  Magnesium     Status: None   Collection Time: 12/01/16  3:55 AM  Result Value Ref Range   Magnesium 1.7 1.7 - 2.4 mg/dL  Phosphorus     Status: None   Collection Time: 12/01/16  3:55 AM  Result Value Ref Range   Phosphorus 3.4 2.5 - 4.6 mg/dL  Glucose, capillary     Status: Abnormal   Collection Time: 12/01/16  8:33 AM  Result Value Ref Range   Glucose-Capillary 216 (H) 65 - 99 mg/dL   Comment 1 Notify RN    Comment 2 Document in Chart    Dg Chest Port 1 View  Result Date: 11/30/2016 CLINICAL DATA:  Shortness of breath EXAM: PORTABLE CHEST 1 VIEW COMPARISON:  11/28/2016 FINDINGS: Persistent right lower lobe airspace disease. Trace bilateral pleural effusions. No pneumothorax. Stable cardiomegaly. Thoracic aortic atherosclerosis. Interval extubation. No acute osseous abnormality. IMPRESSION: 1. Persistent right lower lobe airspace disease concerning for pneumonia. Followup PA and lateral chest X-ray is recommended in 3-4 weeks following trial of antibiotic therapy to ensure resolution and exclude underlying malignancy. Electronically Signed   By: Kathreen Devoid   On: 11/30/2016 12:35   Dg Swallowing Func-speech Pathology  Result Date: 12/01/2016 Objective Swallowing Evaluation: Type of Study: MBS-Modified Barium Swallow Study Patient Details Name: Drayven Marchena MRN: [id number] Date of Birth: [birth year] Today's Date: 12/01/2016 Time: SLP Start Time (ACUTE ONLY): 1015-SLP Stop Time (ACUTE ONLY): 1030 SLP Time Calculation (min) (ACUTE ONLY): 15 min Past Medical History: Past Medical History: Diagnosis Date . AAA (abdominal aortic aneurysm) ([place])  . Anemia  . Asthma  . Carotid artery disease ([place])  . CHF (congestive heart failure) ([place])  . Coronary artery disease  . Depression  . Gout  . H. pylori infection  . Hard of hearing  . Hiatal hernia  .  Hyperplasia, prostate  . Hypertension  . PUD (peptic ulcer disease)  . Renal failure, acute ([place]) 11/08/2012 Past Surgical History: Past Surgical History: Procedure Laterality Date . CAROTID STENT INSERTION  2015 . CAROTID STENT INSERTION N/A 05/09/2013  Procedure: CAROTID STENT INSERTION;  Surgeon: Jonathon Harp, MD;  Location: [facility] CATH LAB;  Service: Cardiovascular;  Laterality: N/A; . ESOPHAGOGASTRODUODENOSCOPY  06/13/2008,12/31/12 . IR PERCUTANEOUS ART THROMBECTOMY/INFUSION INTRACRANIAL INC DIAG ANGIO  11/27/2016 . LEFT HEART CATHETERIZATION WITH CORONARY ANGIOGRAM N/A 05/04/2013  Procedure: LEFT HEART CATHETERIZATION WITH CORONARY ANGIOGRAM;  Surgeon: Jonathon M Martinique, MD;  Location: [facility] CATH LAB;  Service: Cardiovascular;  Laterality: N/A; . RADIOLOGY WITH ANESTHESIA N/A 11/27/2016  Procedure: IR WITH ANESTHESIA;  Surgeon: Jonathon Bras, MD;  Location: [place];  Service: Radiology;  Laterality: N/A; HPI: Cardale Khamjumphonis a 83 y.o.malewith PMH  including multiple strokes in the past, A.fib on xarelto. He presented to [facility] ED 10/17 with acute aphasia and right sided weakness. Had similar  symptoms with acute CVA 1 month prior. After admission pt underwent complete revascularization of occluded left MCA. Per IR notes, had small hemorrhagic conversion in left caudate and left parietal occipital lobes. Intubated from 10/17-10/19. Increased work of breathing post intubation, possibly baseline due to COPD per notes. Likely some ventilator acquired aspiration pna per notes. MBS on 10/20/16 from prior CVA admission shows swallow [facility] though dys 2 diet recommended given mild oral deficits with missing dentition. Pt is known to have baseline aphasia from prior CVA; last SLE 07/18/16 reported persistent aphasia and resolution of dysarthria.  Subjective: Pt in bed, coughing intermittently (weak, non-productive) Assessment / Plan / Recommendation CHL IP CLINICAL IMPRESSIONS 12/01/2016 Clinical Impression Patient presents  with a functional oropharyngeal swallow. Oral phase mildly delayed, suspect a combination of cognitive and respiratory deficits contributing, with swallow triggering intermittently at the pyriform sinuses with thin liquids but with full airway protection. Recommend initiation of conservative diet for energy conservation with good potential to advance.  SLP Visit Diagnosis Dysphagia, unspecified (R13.10) Attention and concentration deficit following -- Frontal lobe and executive function deficit following -- Impact on safety and function Mild aspiration risk   CHL IP TREATMENT RECOMMENDATION 12/01/2016 Treatment Recommendations Therapy as outlined in treatment plan below   Prognosis 12/01/2016 Prognosis for Safe Diet Advancement Good Barriers to Reach Goals Language deficits Barriers/Prognosis Comment -- CHL IP DIET RECOMMENDATION 12/01/2016 SLP Diet Recommendations Dysphagia 1 (Puree) solids;Thin liquid Liquid Administration via Cup;Straw Medication Administration Crushed with puree Compensations Slow rate;Small sips/bites;Monitor for anterior loss Postural Changes Seated upright at 90 degrees   CHL IP OTHER RECOMMENDATIONS 12/01/2016 Recommended Consults -- Oral Care Recommendations Oral care BID Other Recommendations --   CHL IP FOLLOW UP RECOMMENDATIONS 12/01/2016 Follow up Recommendations None   CHL IP FREQUENCY AND DURATION 12/01/2016 Speech Therapy Frequency (ACUTE ONLY) min 2x/week Treatment Duration 1 week      CHL IP ORAL PHASE 12/01/2016 Oral Phase WFL Oral - Pudding Teaspoon -- Oral - Pudding Cup -- Oral - Honey Teaspoon -- Oral - Honey Cup -- Oral - Nectar Teaspoon -- Oral - Nectar Cup -- Oral - Nectar Straw -- Oral - Thin Teaspoon -- Oral - Thin Cup -- Oral - Thin Straw -- Oral - Puree -- Oral - Mech Soft -- Oral - Regular -- Oral - Multi-Consistency -- Oral - Pill -- Oral Phase - Comment --  CHL IP PHARYNGEAL PHASE 12/01/2016 Pharyngeal Phase Impaired Pharyngeal- Pudding Teaspoon -- Pharyngeal --  Pharyngeal- Pudding Cup -- Pharyngeal -- Pharyngeal- Honey Teaspoon -- Pharyngeal -- Pharyngeal- Honey Cup -- Pharyngeal -- Pharyngeal- Nectar Teaspoon -- Pharyngeal -- Pharyngeal- Nectar Cup -- Pharyngeal -- Pharyngeal- Nectar Straw -- Pharyngeal -- Pharyngeal- Thin Teaspoon -- Pharyngeal -- Pharyngeal- Thin Cup Delayed swallow initiation-vallecula;Delayed swallow initiation-pyriform sinuses Pharyngeal -- Pharyngeal- Thin Straw Delayed swallow initiation-vallecula;Delayed swallow initiation-pyriform sinuses Pharyngeal -- Pharyngeal- Puree WFL Pharyngeal -- Pharyngeal- Mechanical Soft WFL Pharyngeal -- Pharyngeal- Regular -- Pharyngeal -- Pharyngeal- Multi-consistency -- Pharyngeal -- Pharyngeal- Pill (No Data) Pharyngeal -- Pharyngeal Comment --  CHL IP CERVICAL ESOPHAGEAL PHASE 12/01/2016 Cervical Esophageal Phase WFL Pudding Teaspoon -- Pudding Cup -- Honey Teaspoon -- Honey Cup -- Nectar Teaspoon -- Nectar Cup -- Nectar Straw -- Thin Teaspoon -- Thin Cup -- Thin Straw -- Puree -- Mechanical Soft -- Regular -- Multi-consistency -- Pill -- Cervical Esophageal Comment -- CHL IP GO 07/18/2016 Functional Assessment Tool Used NOMS; clinical judgment Functional Limitations Other Speech Language Pathology Swallow Current Status [phone]) CI Swallow Goal Status (I4332)  CI Swallow Discharge Status [phone]) (None) Motor Speech Current Status [phone]) (None) Motor Speech Goal Status [phone]) (None) Motor Speech Goal Status [phone]) (None) Spoken Language Comprehension Current Status [phone]) (None) Spoken Language Comprehension Goal Status (O9629) (None) Spoken Language Comprehension Discharge Status [phone]) (None) Spoken Language Expression Current Status [phone]) (None) Spoken Language Expression Goal Status [phone]) (None) Spoken Language Expression Discharge Status [phone]) (None) Attention Current Status (Q0347) (None) Attention Goal Status (Q2595) (None) Attention Discharge Status [phone]) (None) Memory Current Status (I4332)  (None) Memory Goal Status (R5188) (None) Memory Discharge Status (C1660) (None) Voice Current Status (Y3016) (None) Voice Goal Status (W1093) (None) Voice Discharge Status (A3557) (None) Other Speech-Language Pathology Functional Limitation Current Status (D2202) CJ Other Speech-Language Pathology Functional Limitation Goal Status (R4270) CI Other Speech-Language Pathology Functional Limitation Discharge Status [phone]) (None) Gabriel Rainwater [place], [place] [phone] McCoy Leah Meryl 12/01/2016, 10:43 AM                  Medical Problem List and Plan: 1.  Right sided weakness with aphasia secondary to left MCA infarction embolic secondary to proximal M2 occlusion status post thrombectomy with  revascularization with history of multiple CVAs 2.  DVT Prophylaxis/Anticoagulation: PLAN TO BEGIN COUMADIN 12/06/2016 with goal INR of 2.5-3.5 3. Pain Management: Tylenol as needed 4. Mood: Provide emotional support 5. Neuropsych: This patient is capable of making decisions on his own behalf. 6. Skin/Wound Care: Routine skin checks 7. Fluids/Electrolytes/Nutrition: Routine I&O with follow-up chemistries 8. Dysphagia. Dysphagia #1 thin liquid. Follow-up speech therapy 9. Pneumonia. Zosyn/vancomycin  as directed. Complete course of prednisone with slow taper 10. Atrial fibrillation. Patient on Xarelto prior to admission. Discuss when to resume anticoagulation of Coumadin to begin the weekend of 12/06/2016. Cardiac rate controlled 11. CKD stage III. Follow-up chemistries 12. Diastolic congestive heart failure. Monitor for any signs of fluid overload 13. Hypertension. Coreg 6.25 mg twice a day 14. Diabetes mellitus and peripheral neuropathy. Hemoglobin A1c 6.0.SSI 15. MRSA PCR screen positive. Contact precautions 16. Hyperlipidemia. Lipitor 17. Asthma. Continue nebulizers as directed 18. Gout. Zyloprim 300 mg daily  Post Admission Physician Evaluation: 1. Preadmission assessment reviewed and changes made  below. 2. Functional deficits secondary  to  left MCA infarction. 3. Patient is admitted to receive collaborative, interdisciplinary care between the physiatrist, rehab nursing staff, and therapy team. 4. Patient's level of medical complexity and substantial therapy needs in context of that medical necessity cannot be provided at a lesser intensity of care such as a SNF. 5. Patient has experienced substantial functional loss from his/her baseline which was documented above under the "Functional History" and "Functional Status" headings.  Judging by the patient's diagnosis, physical exam, and functional history, the patient has potential for functional progress which will result in measurable gains while on inpatient rehab.  These gains will be of substantial and practical use upon discharge  in facilitating mobility and self-care at the household level. 48. Physiatrist will provide 24 hour management of medical needs as well as oversight of the therapy plan/treatment and provide guidance as appropriate regarding the interaction of the two. 7. 24 hour rehab nursing will assist with safety, disease management and patient education  and help integrate therapy concepts, techniques,education, etc. 8. PT will assess and treat for/with: Lower extremity strength, range of motion, stamina, balance, functional mobility, safety, adaptive techniques and equipment, coping skills, pain control, education.   Goals are: Min A. 9. OT will assess and treat for/with: ADL's, functional mobility, safety, upper extremity strength, adaptive techniques and  equipment, ego support, and community reintegration.   Goals are: Min A. Therapy may proceed with showering this patient. 10. SLP will assess and treat for/with: speech, language, swallowing, cognition.  Goals are: Min/Mod A. 11. Case Management and Social Worker will assess and treat for psychological issues and discharge planning. 12. Team conference will be held weekly to  assess progress toward goals and to determine barriers to discharge. 13. Patient will receive at least 3 hours of therapy per day at least 5 days per week. 14. ELOS: 20-25 days.       15. Prognosis:  good  Delice Lesch, MD, ABPMR Lauraine Rinne J., PA-C 12/01/2016

## 2016-12-03 NOTE — Progress Notes (Signed)
Rehab admissions - I have faxed updated PT/OT notes to [facility] medicare for review.  I will follow up once I hear back from insurance case manager regarding possible inpatient rehab admission.  Call me for questions.  #[phone]

## 2016-12-03 NOTE — Progress Notes (Addendum)
Patient arrived to unit with daughter and RN. Oriented to unit. Discussed safety and fall risk. No pain at this time. Resting comfortably with call light in reach.  Discussed D1 diet with family and encouraged them to speak with speech therapy tomorrow regarding food from home.

## 2016-12-03 NOTE — Progress Notes (Signed)
Marcello Fennel, MD Physician Signed Physical Medicine and Rehabilitation  Consult Note Date of Service: 12/01/2016 9:00 AM  Related encounter: ED to Hosp-Admission (Discharged) from 11/26/2016 in [facility] NEURO/TRAUMA/SURGICAL ICU     Expand All Collapse All   [] Hide copied text [] Hover for attribution information      Physical Medicine and Rehabilitation Consult Reason for Consult: Right sided weakness and aphasia Referring Physician: Dr.Xu   HPI: [name] is a 83 y.o. right handed non-English speaking male with history of multiple strokes most recent 10/21/2017 as well as atrial fibrillation maintained on xarelto, diabetes mellitus, CKD stage III and diastolic congestive heart failure. Per chart review and daughter, patient lives with spouse. He plans to return to his daughter's home. Used a single-point cane and standard walker prior to admission. One level home with 3 steps to entry. Presented 11/27/2016 with acute onset of aphasia and right-sided weakness. CT reviewed, showing frontotemporal lobe infarct.  Per report, CT/MRI showed acute lateral left frontal lobe infarction. Extensive subacute and chronic ischemia including a large subacute left temporal lobe infarct new as compared to 10/18/2016. Small volume subarachnoid hemorrhage in the left perimesencephalic cistern . EEG evidence of mild to moderate generalized background slowing consistent with a bi-hemisphere malfunction. Recent echocardiogram with ejection fraction of 55% no wall motion abnormalities. Left common carotid arteriogram showed occluded left MCA and underwent revascularization per interventional radiology. Patient needed ventilatory support for a short time. Bouts of hypokalemia with potassium supplement added. A chest x-ray 10/21 showed persistent right lower lobe airspace disease concerning for pneumonia currently maintained on broad antibiotic therapy. Physical and occupational therapy evaluations  completed with recommendations of physical medicine rehabilitation consult.  Review of Systems  Unable to perform ROS: Language       Past Medical History:  Diagnosis Date  . AAA (abdominal aortic aneurysm) (HCC)   . Anemia   . Asthma   . Carotid artery disease (HCC)   . CHF (congestive heart failure) (HCC)   . Coronary artery disease   . Depression   . Gout   . H. pylori infection   . Hard of hearing   . Hiatal hernia   . Hyperplasia, prostate   . Hypertension   . PUD (peptic ulcer disease)   . Renal failure, acute (HCC) 11/08/2012   Past Surgical History:  Procedure Laterality Date  . CAROTID STENT INSERTION  2015  . CAROTID STENT INSERTION N/A 05/09/2013   Procedure: CAROTID STENT INSERTION;  Surgeon: Runell Gess, MD;  Location: [facility] CATH LAB;  Service: Cardiovascular;  Laterality: N/A;  . ESOPHAGOGASTRODUODENOSCOPY  06/13/2008,12/31/12  . IR PERCUTANEOUS ART THROMBECTOMY/INFUSION INTRACRANIAL INC DIAG ANGIO  11/27/2016  . LEFT HEART CATHETERIZATION WITH CORONARY ANGIOGRAM N/A 05/04/2013   Procedure: LEFT HEART CATHETERIZATION WITH CORONARY ANGIOGRAM;  Surgeon: Peter M [place], MD;  Location: [facility] CATH LAB;  Service: Cardiovascular;  Laterality: N/A;  . RADIOLOGY WITH ANESTHESIA N/A 11/27/2016   Procedure: IR WITH ANESTHESIA;  Surgeon: Julieanne Cotton, MD;  Location: [facility] OR;  Service: Radiology;  Laterality: N/A;        Family History  Problem Relation Age of Onset  . Colon cancer Neg Hx    Social History:  reports that he quit smoking about 14 years ago. He has never used smokeless tobacco. He reports that he does not drink alcohol or use drugs. Allergies:       Allergies  Allergen Reactions  . Levofloxacin Anaphylaxis and Other (See Comments)    Patient told  his daughter that it made him "feel worse than he already did" (overall) Headache also (has refused to take anymore)  . Eliquis [Apixaban] Itching and Swelling  . Phenazopyridine Hcl  Other (See Comments)    Unknown reaction  . Septra [Sulfamethoxazole-Trimethoprim] Nausea And Vomiting and Other (See Comments)    GI Upset  . Latex Rash  . Tape Rash         Medications Prior to Admission  Medication Sig Dispense Refill  . allopurinol (ZYLOPRIM) 300 MG tablet Take 300 mg by mouth daily. To prevent gout    . arformoterol (BROVANA) 15 MCG/2ML NEBU Take 2 mLs (15 mcg total) by nebulization 2 (two) times daily. 120 mL 2  . arformoterol (BROVANA) 15 MCG/2ML NEBU Inhale 2 mLs into the lungs 2 (two) times daily.    [name] aspirin EC 81 MG tablet Take 1 tablet (81 mg total) by mouth daily. 90 tablet 3  . atorvastatin (LIPITOR) 40 MG tablet Take 1 tablet (40 mg total) by mouth daily at 6 PM. 30 tablet 5  . budesonide (PULMICORT) 0.5 MG/2ML nebulizer solution Take 2 mLs (0.5 mg total) by nebulization 2 (two) times daily. 2 mL 12  . carvedilol (COREG) 6.25 MG tablet Take 1 tablet (6.25 mg total) by mouth 2 (two) times daily with a meal. 60 tablet 6  . esomeprazole (NEXIUM) 40 MG capsule Take 1 capsule (40 mg total) by mouth 2 (two) times daily before a meal.    . HYDROcodone-homatropine (HYCODAN) 5-1.5 MG/5ML syrup Take 5 mLs by mouth every 6 (six) hours as needed for cough.    [name] ipratropium-albuterol (DUONEB) 0.5-2.5 (3) MG/3ML SOLN Take 3 mLs by nebulization 2 (two) times daily as needed (for shortness of breath or wheezing).    . isosorbide mononitrate (IMDUR) 30 MG 24 hr tablet Take 30 mg by mouth once daily (Patient taking differently: Take 30 mg by mouth daily. For control of heart pain) 15 tablet 9  . meclizine (ANTIVERT) 25 MG tablet Take 25 mg by mouth 3 (three) times daily as needed for dizziness.     [name] NITROSTAT 0.4 MG SL tablet PLACE 1 TABLET UNDER THE TONGUE EVERY 5 MINUTES FOR 3 DOSES AS NEEDED FOR CHEST PAIN (Patient taking differently: PLACE 0.4 MG UNDER THE TONGUE EVERY 5 MINUTES FOR 3 DOSES AS NEEDED FOR CHEST PAIN) 25 tablet 5  . nystatin (MYCOSTATIN)  100000 UNIT/ML suspension Take 3 mLs by mouth 4 (four) times daily.    . tamsulosin (FLOMAX) 0.4 MG CAPS capsule Take 0.4 mg by mouth daily. To improve bladder function    . XARELTO 15 MG TABS tablet Take 15 mg by mouth daily.     . dabigatran (PRADAXA) 75 MG CAPS capsule Take 1 capsule (75 mg total) by mouth every 12 (twelve) hours. (Patient not taking: Reported on 11/26/2016) 60 capsule 0    Home: Home Living Family/patient expects to be discharged to:: Private residence Living Arrangements: Spouse/significant other Available Help at Discharge: Family, Available 24 hours/day Type of Home: House Home Access: Stairs to enter [employer] of Steps: 3 Entrance Stairs-Rails: None Home Layout: One level Bathroom Shower/Tub: Tub only Firefighter: Standard Home Equipment: Cane - single point, Environmental consultant - standard Additional Comments: information taken from previous admittion, pt currently intubated  Lives With: Family  Functional History: Prior Function Level of Independence: Needs assistance Comments: no family available to provide information about pt's level of functioning since his recent stroke Functional Status:  Mobility: Bed Mobility Overal  bed mobility: Needs Assistance Bed Mobility: Supine to Sit, Sit to Supine Supine to sit: +2 for physical assistance, Max assist Sit to supine: +2 for physical assistance, Total assist General bed mobility comments: initiates supine to sit with head/trunk, requires assist for LEs to advance to hips to EOB and raise trunk Transfers Overall transfer level: Needs assistance Equipment used: 2 person hand held assist Transfers: Sit to/from Stand Sit to Stand: Max assist, +2 physical assistance General transfer comment: stood from EOB x 1, blocks knees on bed, assist to rise and balance, stood x 1 minute, pt with urinary incontinence upon standing  ADL: ADL Overall ADL's : Needs assistance/impaired General ADL Comments:  currently requiring total assist  Cognition: Cognition Overall Cognitive Status: Impaired/Different from baseline (difficult to assess) Arousal/Alertness: Awake/alert Orientation Level: Other (comment) (expressive aphasia) Attention: Focused, Sustained Focused Attention: Appears intact Sustained Attention: Appears intact Cognition Arousal/Alertness: Lethargic Behavior During Therapy: Flat affect Overall Cognitive Status: Impaired/Different from baseline (difficult to assess) General Comments: pt intubated, not [place] speaking, no family available Difficult to assess due to: Impaired communication, Intubated  Blood pressure 134/65, pulse (!) 31, temperature 98.3 F (36.8 C), temperature source Axillary, resp. rate 16, height 5\' 5"  (1.651 m), weight 63.9 kg (140 lb 14 oz), SpO2 100 %. Physical Exam  Vitals reviewed. Constitutional: He appears well-developed.  83 year old right-handed male. Frail  HENT:  Head: Normocephalic and atraumatic.  Eyes: Right eye exhibits no discharge. Left eye exhibits no discharge.  Pupils reactive to light  Neck: Normal range of motion. Neck supple. No thyromegaly present.  Cardiovascular:  Irregularly irregular  Respiratory:  Decreased breath sounds at the bases without rails +[place] Receiving breathing treatment  GI: Soft. Bowel sounds are normal. He exhibits no distension.  Musculoskeletal: He exhibits no edema or tenderness.  Neurological: He is alert.  Patient is non-English-speaking which further limits overall exam. He is not follow any commands, even when translated by daughter.  Spontaneously moving RUE.    Skin: Skin is warm and dry.  Psychiatric:  Unable to assess due to language and mentation       Assessment/Plan: Diagnosis: Left MCA CVA Labs and images independently reviewed.  Records reviewed and summated above. Stroke: Continue secondary stroke prophylaxis and Risk Factor Modification listed below:   Antiplatelet therapy:    Blood Pressure Management:  Continue current medication with prn's with permisive HTN per primary team Statin Agent:   Diabetes management:   ?Right sided hemiparesis: fit for orthosis to prevent contractures (resting hand splint for day, wrist cock up splint at night, PRAFO, etc) Motor recovery: Fluoxetine  1. Does the need for close, 24 hr/day medical supervision in concert with the patient's rehab needs make it unreasonable for this patient to be served in a less intensive setting? Yes 2. Co-Morbidities requiring supervision/potential complications: pneumonia (narrow IV Vanz/Zosn when  Appropriate) hypokalemia (cont to monitor, supplement as necessary), multiple strokes most recent 10/21/2017, atrial fibrillation (cont meds, monitor HR with increased physical activity), diabetes mellitus (Monitor in accordance with exercise and adjust meds as necessary), CKD stage III (avoid nephrotoxic meds), diastolic congestive heart failure (monitor for sign/symptoms of fluid overload), tachypnea (monitor RR and O2 Sats with increased physical exertion), HTN (monitor and provide prns in accordance with increased physical exertion and pain), hypokalemia (continue to monitor and replete as necessary), ABLA (transfuse if necessary to ensure appropriate perfusion for increased activity tolerance) 3. Due to bladder management, bowel management, safety, skin/wound care, disease management, medication administration, pain management and  patient education, does the patient require 24 hr/day rehab nursing? Yes 4. Does the patient require coordinated care of a physician, rehab nurse, PT (1-2 hrs/day, 5 days/week), OT (1-2 hrs/day, 5 days/week) and SLP (1-2 hrs/day, 5 days/week) to address physical and functional deficits in the context of the above medical diagnosis(es)? Yes Addressing deficits in the following areas: balance, endurance, locomotion, strength, transferring, bowel/bladder control, bathing, dressing, feeding,  grooming, toileting, cognition, speech, language, swallowing and psychosocial support 5. Can the patient actively participate in an intensive therapy program of at least 3 hrs of therapy per day at least 5 days per week? Potentially 6. The potential for patient to make measurable gains while on inpatient rehab is excellent 7. Anticipated functional outcomes upon discharge from inpatient rehab are min assist and mod assist  with PT, min assist and mod assist with OT, min assist and mod assist with SLP. 8. Estimated rehab length of stay to reach the above functional goals is: 23-27 days. 9. Anticipated D/C setting: Home 10. Anticipated post D/C treatments: HH therapy and Home excercise program 11. Overall Rehab/Functional Prognosis: good and fair  RECOMMENDATIONS: This patient's condition is appropriate for continued rehabilitative care in the following setting: CIR after completion of medical workup and medically stable. Patient has agreed to participate in recommended program. Potentially Note that insurance prior authorization may be required for reimbursement for recommended care.  Comment: Rehab Admissions Coordinator to follow up.  ANGIULLI,DANIEL J., PA-C 12/01/2016    Revision History                        Routing History

## 2016-12-03 NOTE — Progress Notes (Signed)
Retta Diones, RN Rehab Admission Coordinator Signed Physical Medicine and Rehabilitation  PMR Pre-admission Date of Service: 12/03/2016 10:02 AM  Related encounter: ED to Hosp-Admission (Discharged) from 11/26/2016 in [place] NEURO/TRAUMA/SURGICAL ICU       '[]' Hide copied text PMR Admission Coordinator Pre-Admission Assessment  Patient: [name] is an 83 y.o., male MRN: [id number] DOB: [birth year] Height: '5\' 5"'  (165.1 cm) Weight: 63.9 kg (140 lb 14 oz)                                                                                                                                               Insurance Information HMO:  Yes    PPO:       PCP:       IPA:       80/20:       OTHER:  Group # [id number] PRIMARY: UHC Medicare      Policy#: [id number]      Subscriber: patient CM Name: Vevelyn Royals      Phone#: [phone]     Fax#: [phone] Pre-Cert#:  [id number] with weekly updates      Employer:  Retired Benefits:  Phone #: [phone]     Name:  Online Eff. Date: 02/11/16     Deduct:  $0      Out of Pocket Max: [phone] (met $0)      Life Max: N/A CIR: $0 days 1-90      SNF: $0 days 1-20; $167 days 21-100 (no copay if QMB) Outpatient: medical necessity     Co-Pay: None Home Health: 100%      Co-Pay: none DME: 80%     Co-Pay: 20% Providers: in network  SECONDARY: Medicaid of [place]      Policy#: [id number]      Subscriber: patient CM Name:        Phone#:       Fax#:   Pre-Cert#:        Employer:  Retired Dealer:  Phone #: [phone]     Name:  Automated Eff. Date: Eligible 12/01/16 with coverage code [id number]     Deduct:        Out of Pocket Max:        Life Max:   CIR:        SNF:   Outpatient:       Co-Pay:   Home Health:        Co-Pay:   DME:       Co-Pay:    Emergency Contact Information        Contact Information    Name Relation Home Work [place] Daughter [phone]     [facility] Relative   [phone]     Current Medical History    Patient Admitting Diagnosis: L MCA CVA   History of Present Illness: An 83 y.o.right handednon-English speaking malewith history  of multiple strokes most recent 10/21/2017 as well as atrial fibrillation maintained on xarelto, diabetes mellitus, CKD stage III and diastolic congestive heart failure. Per chart review and daughter, patient lives with spouse. He plans to return to his daughter's home. Used a single-point cane and standard walker prior to admission. One level home with 3 steps to entry. Presented 11/27/2016 with acute onset of aphasia and right-sided weakness. CT reviewed, showing frontotemporal lobe infarct. Per report, CT/MRI showed acute lateral left frontal lobe infarction. Extensive subacute and chronic ischemia including a large subacute left temporal lobe infarct new as compared to 10/18/2016. Small volume subarachnoid hemorrhage in the left perimesencephalic cistern . EEG evidence of mild to moderate generalized background slowing consistent with a bi-hemisphere malfunction. Recent echocardiogram with ejection fraction of 55% no wall motion abnormalities. Left common carotid arteriogram showed occluded left MCA and underwent revascularization per interventional radiology. Patient needed ventilatory support for a short time. Bouts of hypokalemia with potassium supplement added. Chest x-ray 10/19showed persistent right lower lobe airspace disease concerning for pneumonia currently maintained on broad antibiotic therapy.Currently on dysphagia #1 thin liquid diet with follow-up modified barium swallowcompleted 12/01/2016. MRSA PCR screen positive maintained on contact cautions Physical and occupational therapy evaluations completed with recommendations of physical medicine rehabilitation consult.Patient to be admitted for a comprehensive inpatient rehabilitation program.     Total: 14=NIH  Past Medical History      Past Medical History:  Diagnosis Date  . AAA (abdominal aortic  aneurysm) ([place])   . Anemia   . Asthma   . Carotid artery disease ([place])   . CHF (congestive heart failure) ([place])   . Coronary artery disease   . Depression   . Diabetes mellitus type 2 in nonobese ([facility])   . Gout   . H. pylori infection   . Hard of hearing   . Hiatal hernia   . Hyperplasia, prostate   . Hypertension   . PUD (peptic ulcer disease)   . Renal failure, acute ([place]) 11/08/2012    Family History  family history is not on file.  Prior Rehab/Hospitalizations: Had [place] therapies and RN through [facility] most recently   Has the patient had major surgery during 100 days prior to admission? No  Current Medications   Current Facility-Administered Medications:  .  acetaminophen (TYLENOL) tablet 650 mg, 650 mg, Oral, Q4H PRN, 650 mg at 12/03/16 1203 **OR** [DISCONTINUED] acetaminophen (TYLENOL) solution 650 mg, 650 mg, Per Tube, Q4H PRN **OR** acetaminophen (TYLENOL) suppository 650 mg, 650 mg, Rectal, Q4H PRN, Luanne Bras, MD, 650 mg at 11/28/16 0108 .  albuterol (PROVENTIL) (2.5 MG/3ML) 0.083% nebulizer solution 2.5 mg, 2.5 mg, Nebulization, Q2H PRN, Hammonds, Sharyn Blitz, MD, 2.5 mg at 11/29/16 1251 .  allopurinol (ZYLOPRIM) tablet 300 mg, 300 mg, Oral, Daily, Kerney Elbe, MD, 300 mg at 12/03/16 1045 .  arformoterol (BROVANA) nebulizer solution 15 mcg, 15 mcg, Nebulization, BID, Kerney Elbe, MD, 15 mcg at 12/03/16 1142 .  atorvastatin (LIPITOR) tablet 40 mg, 40 mg, Oral, q1800, Kerney Elbe, MD, 40 mg at 12/02/16 1716 .  budesonide (PULMICORT) nebulizer solution 0.5 mg, 0.5 mg, Nebulization, BID, Kerney Elbe, MD, 0.5 mg at 12/03/16 1145 .  carvedilol (COREG) tablet 6.25 mg, 6.25 mg, Oral, BID WC, Whiteheart, Kathryn A, NP, 6.25 mg at 12/03/16 0840 .  chlorhexidine gluconate (MEDLINE KIT) (PERIDEX) 0.12 % solution 15 mL, 15 mL, Mouth Rinse, BID, Garvin Fila, MD, 15 mL at 12/03/16 0845 .  HYDROcodone-acetaminophen (NORCO/VICODIN) 5-325 MG per tablet 1  tablet, 1 tablet, Oral, Q6H PRN, Mauri Brooklyn, MD, 1 tablet at 11/30/16 1945 .  ipratropium-albuterol (DUONEB) 0.5-2.5 (3) MG/3ML nebulizer solution 3 mL, 3 mL, Nebulization, Q6H PRN, Halford Chessman, Vineet, MD .  MEDLINE mouth rinse, 15 mL, Mouth Rinse, QID, Garvin Fila, MD, 15 mL at 12/03/16 1153 .  metoprolol tartrate (LOPRESSOR) injection 2.5-5 mg, 2.5-5 mg, Intravenous, Q3H PRN, Desai, Rahul P, PA-C .  nitroGLYCERIN (NITROSTAT) SL tablet 0.4 mg, 0.4 mg, Sublingual, Q5 min PRN, Kerney Elbe, MD .  nystatin (MYCOSTATIN) 100000 UNIT/ML suspension 300,000 Units, 3 mL, Oral, TID AC & HS, Kerney Elbe, MD, 300,000 Units at 12/03/16 1153 .  ondansetron (ZOFRAN) injection 4 mg, 4 mg, Intravenous, Q6H PRN, Deveshwar, Sanjeev, MD .  pantoprazole (PROTONIX) EC tablet 40 mg, 40 mg, Oral, Daily, Rosalin Hawking, MD, 40 mg at 12/03/16 1045 .  piperacillin-tazobactam (ZOSYN) IVPB 3.375 g, 3.375 g, Intravenous, Q8H, Chesley Mires, MD, Stopped at 12/03/16 1006 .  predniSONE (DELTASONE) tablet 40 mg, 40 mg, Oral, Q breakfast, Corey Harold, NP, 40 mg at 12/03/16 0840 .  senna-docusate (Senokot-S) tablet 1 tablet, 1 tablet, Oral, QHS PRN, Kerney Elbe, MD .  vancomycin (VANCOCIN) IVPB 1000 mg/200 mL premix, 1,000 mg, Intravenous, Q24H, Chesley Mires, MD, Stopped at 12/03/16 1300  Patients Current Diet: DIET - DYS 1 Room service appropriate? Yes; Fluid consistency: Thin DIET - DYS 1  Precautions / Restrictions Precautions Precautions: Fall Precaution Comments: daughter present and translated  Restrictions Weight Bearing Restrictions: No   Has the patient had 2 or more falls or a fall with injury in the past year?No  Prior Activity Level Household: Stays at home most of the time.  Does go out on the porch.  Goes to MD appointments occasionally.  Home Assistive Devices / Equipment Home Equipment: Cane - single point, Environmental consultant - standard  Prior Device Use: Indicate devices/aids used by the patient prior to  current illness, exacerbation or injury? Walker and [employer]  Prior Functional Level Prior Function Level of Independence: Needs assistance Comments: no family available to provide information about pt's level of functioning since his recent stroke  Self Care: Did the patient need help bathing, dressing, using the toilet or eating?  Independent  Indoor Mobility: Did the patient need assistance with walking from room to room (with or without device)? Independent  Stairs: Did the patient need assistance with internal or external stairs (with or without device)? Independent  Functional Cognition: Did the patient need help planning regular tasks such as shopping or remembering to take medications? Independent  Current Functional Level Cognition  Arousal/Alertness: Awake/alert Overall Cognitive Status: Impaired/Different from baseline Difficult to assess due to: Non-English speaking Current Attention Level: Sustained Orientation Level: Oriented to person Following Commands: Follows one step commands inconsistently, Follows one step commands with increased time Safety/Judgement: Decreased awareness of safety, Decreased awareness of deficits (R sided neglect) General Comments: Pt will follow gestural commands inconsistently.  he is able to sustain attention for 30-60 seconds.   Difficult to accurately assess cognition due to severity of language deficits  Attention: Focused, Sustained Focused Attention: Appears intact Sustained Attention: Appears intact    Extremity Assessment (includes Sensation/Coordination)  Upper Extremity Assessment: Defer to OT evaluation RUE Deficits / Details: FF to 45 degrees as if reaching, unable to formally test due to cognition/language barrier RUE Coordination: decreased fine motor, decreased gross motor LUE Deficits / Details: FF to 45 degrees as if reaching, unable to formally assess due to cognition/language barrier LUE Coordination: decreased  fine  motor, decreased gross motor  Lower Extremity Assessment: RLE deficits/detail, LLE deficits/detail RLE Deficits / Details: lifting some, but not antigravity and at rest pushed into extension LLE Deficits / Details: lifting in bed antigravity, not following commands for formal testing    ADLs  Overall ADL's : Needs assistance/impaired Eating/Feeding: Sitting, Maximal assistance Eating/Feeding Details (indicate cue type and reason): Pt requires hand over hand assist, but with repetition will bring spoon to mouth without assist  Grooming: Wash/dry face, Sitting, Moderate assistance Grooming Details (indicate cue type and reason): Initially required hand over hand assist, but with repetition, and gestures, he was able to perform with min - mod A.  Initially attempted to chew on washcloth  Lower Body Dressing: Maximal assistance, Sit to/from stand Lower Body Dressing Details (indicate cue type and reason): with gestural cues and repetition, pt was able to don Lt sock with min A, and mod A for Rt sock.  He repeatedly attempts to don Rt sock on Lt foot despite Lt foot having sock on it  General ADL Comments: currently requiring total assist    Mobility  Overal bed mobility: Needs Assistance Bed Mobility: Supine to Sit Supine to sit: Min assist Sit to supine: +2 for physical assistance, Total assist General bed mobility comments: sitting in chair     Transfers  Overall transfer level: Needs assistance Equipment used: 1 person hand held assist Transfers: Sit to/from Stand Sit to Stand: Mod assist, Max assist, +2 physical assistance General transfer comment: max tactile and verbal cues    Ambulation / Gait / Stairs / Wheelchair Mobility  Ambulation/Gait Ambulation/Gait assistance: Max assist, +2 physical assistance (3rd person for chair follow and lines) Ambulation Distance (Feet): 165 Feet Assistive device: 2 person hand held assist Gait Pattern/deviations: Step-through pattern,  Trunk flexed, Narrow base of support General Gait Details: pt unable to step to command but once pt provided with forward facilitation pt able to instinctively step. maxA to promote trunk extension and look forward. pt with narrow base of supoort and R lateral lean Gait velocity: slow Gait velocity interpretation: Below normal speed for age/gender    Posture / Balance Dynamic Sitting Balance Sitting balance - Comments: Pt requires close min guard assist to lean forward in chair to don socks  Balance Overall balance assessment: Needs assistance Sitting-balance support: Feet supported Sitting balance-Leahy Scale: Fair Sitting balance - Comments: Pt requires close min guard assist to lean forward in chair to don socks  Postural control: Posterior lean Standing balance support: Bilateral upper extremity supported Standing balance-Leahy Scale: Poor Standing balance comment: dependent on physical assist    Special needs/care consideration BiPAP/CPAP No CPM No Continuous Drip IV KVO Dialysis No       Life Vest No Oxygen Yes, used 02 2L [place] at night, rarely used during the day. Special Bed No Trach Size No Wound Vac (area) No  Skin Bruises from IVs and blood draws                            Bowel mgmt: Last BM 12/02/16 Bladder mgmt: External catheter, incontinence Diabetic mgmt No, but has received insulin in the hospital    Previous Home Environment Living Arrangements: Spouse/significant other  Lives With: Family Available Help at Discharge: Family, Available 24 hours/day Type of Home: House Home Layout: One level Home Access: Stairs to enter Entrance Stairs-Rails: None Entrance Stairs-Number of Steps: 3 Bathroom Shower/Tub: Tub only Biochemist, clinical: Standard Additional  Comments: information taken from previous admittion, pt currently intubated  Discharge Living Setting Plans for Discharge Living Setting: Patient's home, House, Lives with (comment) (Lives with wife,  daughters, son and grandchildren.) Type of Home at Discharge: House Discharge Home Layout: One level Discharge Home Access: Stairs to enter Technical brewer of Steps: 2  Social/Family/Support Systems Patient Roles: Spouse, Parent, Other (Comment) (Has a wife, 2 sons, a daughter, 6 grandchildren.) Contact Information: [name] - daughter - speaks [place] and [place] Anticipated Caregiver: daughter and wife and family Anticipated Caregiver's Contact Information: Ernestine Mcmurray - daughter - [phone] Ability/Limitations of Caregiver: Daughter does not work and can assist.  Family stays with patient most of the time. Caregiver Availability: 24/7 Discharge Plan Discussed with Primary Caregiver: Yes Is Caregiver In Agreement with Plan?: Yes Does Caregiver/Family have Issues with Lodging/Transportation while Pt is in Rehab?: No  Goals/Additional Needs Patient/Family Goal for Rehab: PT/OT/SLP min to mod assist goals Expected length of stay: 23-27 days Cultural Considerations: Buddist, speaks [place], have been using daughter to translate because we can not get interpreters for this patient. Dietary Needs: Dys 1, thin liquids Equipment Needs: TBD Pt/Family Agrees to Admission and willing to participate: Yes Program Orientation Provided & Reviewed with Pt/Caregiver Including Roles  & Responsibilities: Yes  Decrease burden of Care through IP rehab admission: N/A  Possible need for SNF placement upon discharge: Not planned  Patient Condition: This patient's medical and functional status has changed since the consult dated: 12/01/16 in which the Rehabilitation Physician determined and documented that the patient's condition is appropriate for intensive rehabilitative care in an inpatient rehabilitation facility. See "History of Present Illness" (above) for medical update. Functional changes are: Currently requiring max assist +2 to ambulate 165 feet +2 HHA. Patient's medical and functional  status update has been discussed with the Rehabilitation physician and patient remains appropriate for inpatient rehabilitation. Will admit to inpatient rehab today.  Preadmission Screen Completed By:  Retta Diones, 12/03/2016 1:48 PM ______________________________________________________________________   Discussed status with Dr. Posey Pronto on 12/03/16 at 44 and received telephone approval for admission today.  Admission Coordinator:  Retta Diones, time 1347/Date 12/03/16       Cosigned by: Jamse Arn, MD at 12/03/2016 1:55 PM  Revision History

## 2016-12-04 ENCOUNTER — Inpatient Hospital Stay (HOSPITAL_COMMUNITY): Payer: Medicare Other | Admitting: Physical Therapy

## 2016-12-04 ENCOUNTER — Other Ambulatory Visit: Payer: Self-pay | Admitting: *Deleted

## 2016-12-04 ENCOUNTER — Inpatient Hospital Stay (HOSPITAL_COMMUNITY): Payer: Medicare Other | Admitting: Speech Pathology

## 2016-12-04 ENCOUNTER — Inpatient Hospital Stay (HOSPITAL_COMMUNITY): Payer: Medicare Other | Admitting: Occupational Therapy

## 2016-12-04 DIAGNOSIS — E876 Hypokalemia: Secondary | ICD-10-CM

## 2016-12-04 DIAGNOSIS — I63512 Cerebral infarction due to unspecified occlusion or stenosis of left middle cerebral artery: Secondary | ICD-10-CM

## 2016-12-04 DIAGNOSIS — I6523 Occlusion and stenosis of bilateral carotid arteries: Secondary | ICD-10-CM

## 2016-12-04 DIAGNOSIS — J690 Pneumonitis due to inhalation of food and vomit: Secondary | ICD-10-CM

## 2016-12-04 LAB — COMPREHENSIVE METABOLIC PANEL
ALT: 35 U/L (ref 17–63)
AST: 38 U/L (ref 15–41)
Albumin: 2.9 g/dL — ABNORMAL LOW (ref 3.5–5.0)
Alkaline Phosphatase: 44 U/L (ref 38–126)
Anion gap: 10 (ref 5–15)
BILIRUBIN TOTAL: 1.6 mg/dL — AB (ref 0.3–1.2)
BUN: 21 mg/dL — AB (ref 6–20)
CO2: 26 mmol/L (ref 22–32)
CREATININE: 1.49 mg/dL — AB (ref 0.61–1.24)
Calcium: 8.2 mg/dL — ABNORMAL LOW (ref 8.9–10.3)
Chloride: 96 mmol/L — ABNORMAL LOW (ref 101–111)
GFR, EST AFRICAN AMERICAN: 46 mL/min — AB (ref >60)
GFR, EST NON AFRICAN AMERICAN: 40 mL/min — AB (ref >60)
Glucose, Bld: 174 mg/dL — ABNORMAL HIGH (ref 65–99)
Potassium: 3.1 mmol/L — ABNORMAL LOW (ref 3.5–5.1)
Sodium: 132 mmol/L — ABNORMAL LOW (ref 135–145)
TOTAL PROTEIN: 6.3 g/dL — AB (ref 6.5–8.1)

## 2016-12-04 LAB — CBC WITH DIFFERENTIAL/PLATELET
BASOS PCT: 0 %
Basophils Absolute: 0 10*3/uL (ref 0.0–0.1)
EOS PCT: 0 %
Eosinophils Absolute: 0 10*3/uL (ref 0.0–0.7)
HEMATOCRIT: 27.1 % — AB (ref 39.0–52.0)
Hemoglobin: 8.9 g/dL — ABNORMAL LOW (ref 13.0–17.0)
LYMPHS ABS: 0.7 10*3/uL (ref 0.7–4.0)
Lymphocytes Relative: 8 %
MCH: 30.3 pg (ref 26.0–34.0)
MCHC: 32.8 g/dL (ref 30.0–36.0)
MCV: 92.2 fL (ref 78.0–100.0)
MONOS PCT: 9 %
Monocytes Absolute: 0.8 10*3/uL (ref 0.1–1.0)
Neutro Abs: 7.8 10*3/uL — ABNORMAL HIGH (ref 1.7–7.7)
Neutrophils Relative %: 83 %
Platelets: 189 10*3/uL (ref 150–400)
RBC: 2.94 MIL/uL — AB (ref 4.22–5.81)
RDW: 15.3 % (ref 11.5–15.5)
WBC: 9.3 10*3/uL (ref 4.0–10.5)

## 2016-12-04 LAB — GLUCOSE, CAPILLARY
GLUCOSE-CAPILLARY: 284 mg/dL — AB (ref 65–99)
GLUCOSE-CAPILLARY: 245 mg/dL — AB (ref 65–99)
Glucose-Capillary: 170 mg/dL — ABNORMAL HIGH (ref 65–99)
Glucose-Capillary: 187 mg/dL — ABNORMAL HIGH (ref 65–99)

## 2016-12-04 MED ORDER — POTASSIUM CHLORIDE CRYS ER 20 MEQ PO TBCR
20.0000 meq | EXTENDED_RELEASE_TABLET | Freq: Two times a day (BID) | ORAL | Status: DC
  Administered 2016-12-04 – 2016-12-11 (×15): 20 meq via ORAL
  Filled 2016-12-04 (×14): qty 1

## 2016-12-04 MED ORDER — ONDANSETRON HCL 4 MG PO TABS
4.0000 mg | ORAL_TABLET | Freq: Three times a day (TID) | ORAL | Status: DC | PRN
  Administered 2016-12-04 – 2016-12-05 (×2): 4 mg via ORAL
  Filled 2016-12-04 (×2): qty 1

## 2016-12-04 NOTE — Progress Notes (Signed)
[place] PHYSICAL MEDICINE & REHABILITATION     PROGRESS NOTE    Subjective/Complaints: Family reports that he had some lower abdominal pain this morning, associated with incontinence  ROS: pt denies nausea, vomiting, diarrhea, cough, shortness of breath or chest pain   Objective: Vital Signs: Blood pressure (!) 134/91, pulse 91, temperature 98.2 F (36.8 C), temperature source Oral, resp. rate 18, height 5\' 5"  (1.651 m), weight 64.3 kg (141 lb 11.2 oz), SpO2 97 %. No results found.  Recent Labs  12/03/16 0222 12/04/16 0659  WBC 8.3 9.3  HGB 8.3* 8.9*  HCT 24.7* 27.1*  PLT 175 189    Recent Labs  12/03/16 0222 12/04/16 0659  NA 132* 132*  K 3.2* 3.1*  CL 97* 96*  GLUCOSE 217* 174*  BUN 25* 21*  CREATININE 1.45* 1.49*  CALCIUM 8.5* 8.2*   CBG (last 3)   Recent Labs  12/03/16 0329 12/03/16 2056 12/04/16 0643  GLUCAP 229* 313* 170*    Wt Readings from Last 3 Encounters:  12/03/16 64.3 kg (141 lb 11.2 oz)  11/27/16 63.9 kg (140 lb 14 oz)  11/05/16 64.9 kg (143 lb)    Physical Exam:  Constitutional: He appears well-developed.  83 year old right-handed male Frail  HENT:  Head: Normocephalic and atraumatic.  Eyes: EOM are normal. Right eye exhibits no discharge. Left eye exhibits no discharge.  Neck: Normal range of motion. Neck supple. No thyromegaly present.  Cardiovascular:  Irr IRR  Respiratory:  CTA Bilaterally without wheezes or rales. Normal effort  GI: Soft. Bowel sounds are normal. He exhibits no distension.  Musculoskeletal: He exhibits no edema or tenderness.  Neurological: He is alert.  Slow processing even when family translates. Minimal initiation with right arm/leg. Extensor tone RLE. LUE and LLE move fairly freely   Skin: Skin is warm and dry.  Psychiatric:  Unable to assess due to language, flat  Assessment/Plan: 1. Right hemiparesis and functional deficits secondary to left MCA infarct which require 3+ hours per day of  interdisciplinary therapy in a comprehensive inpatient rehab setting. Physiatrist is providing close team supervision and 24 hour management of active medical problems listed below. Physiatrist and rehab team continue to assess barriers to discharge/monitor patient progress toward functional and medical goals.  Function:  Bathing Bathing position      Bathing parts      Bathing assist        Upper Body Dressing/Undressing Upper body dressing                    Upper body assist        Lower Body Dressing/Undressing Lower body dressing                                  Lower body assist        Toileting Toileting          Toileting assist     Transfers Chair/bed transfer   Chair/bed transfer method: Squat pivot Chair/bed transfer assist level: Moderate assist (Pt 50 - 74%/lift or lower)       Locomotion Ambulation           Wheelchair          Cognition Comprehension    Expression    Social Interaction    Problem Solving    Memory     Medical Problem List and Plan: 1.  Right sided weakness with  aphasia secondary to left MCA infarction embolic secondary to proximal M2 occlusion status post thrombectomy with  revascularization with history of multiple CVAs  -beginning therapies today  -speaks no English 2.  DVT Prophylaxis/Anticoagulation: PLAN TO BEGIN COUMADIN 12/06/2016 with goal INR of 2.5-3.5 3. Pain Management: Tylenol as needed 4. Mood: Provide emotional support 5. Neuropsych: This patient is capable of making decisions on his own behalf. 6. Skin/Wound Care: Routine skin checks 7. Fluids/Electrolytes/Nutrition: encourage PO  -replace potassium 8. Dysphagia. Dysphagia #1 thin liquid. Follow-up speech therapy 9. Pneumonia. Zosyn/vancomycin  as directed.   -Complete course of prednisone with slow taper 10. Atrial fibrillation. Patient on Xarelto prior to admission. Discuss when to resume anticoagulation of Coumadin to begin  the weekend of 12/06/2016. Cardiac rate controlled 11. CKD stage III. Follow-up chemistries 12. Diastolic congestive heart failure. Monitor for any signs of fluid overload 13. Hypertension. Coreg 6.25 mg twice a day 14. Diabetes mellitus and peripheral neuropathy. Hemoglobin A1c 6.0.SSI 15. MRSA PCR screen positive. Contact precautions 16. Hyperlipidemia. Lipitor 17. Asthma. Continue nebulizers as directed 18. Gout. Zyloprim 300 mg daily 19. ?Dysuria: check urine culture, observe for patterns, urine with mild odor this morning   LOS (Days) 1 A FACE TO FACE EVALUATION WAS PERFORMED  Ranelle Oyster, MD 12/04/2016 8:29 AM

## 2016-12-04 NOTE — Progress Notes (Signed)
Patient information reviewed and entered into eRehab system by Alisyn Lequire, RN, CRRN, PPS Coordinator.  Information including medical coding and functional independence measure will be reviewed and updated through discharge.     Per nursing patient was given "Data Collection Information Summary for Patients in Inpatient Rehabilitation Facilities with attached "Privacy Act Statement-Health Care Records" upon admission.  

## 2016-12-04 NOTE — Evaluation (Signed)
Physical Therapy Assessment and Plan  Patient Details  Name: [name] MRN: [id number] Date of Birth: 10-30-1926  PT Diagnosis: Abnormal posture, Abnormality of gait, Cognitive deficits, Coordination disorder, Hemiparesis dominant and Muscle weakness Rehab Potential: Fair ELOS: 10-12 days   Today's Date: 12/04/2016 PT Individual Time: 1300-1415 PT Individual Time Calculation (min): 75 min    Problem List:  Patient Active Problem List   Diagnosis Date Noted  . Left middle cerebral artery stroke ([place]) 12/03/2016  . Cerebral infarction due to embolism of cerebral artery ([place])   . Chronic atrial fibrillation ([place])   . History of CVA (cerebrovascular accident)   . Dysphagia, post-stroke   . Chronic diastolic congestive heart failure ([place])   . Uncomplicated asthma   . History of gout   . Persistent atrial fibrillation ([place])   . Aphasia   . HCAP (healthcare-associated pneumonia)   . SOB (shortness of breath)   . PAF (paroxysmal atrial fibrillation) ([place])   . Diabetes mellitus type 2 in nonobese (HCC)   . Stage 3 chronic kidney disease ([place])   . Tachypnea   . Benign essential HTN   . Hypokalemia   . Acute blood loss anemia   . Cerebral thrombosis with cerebral infarction 11/27/2016  . Stroke (cerebrum) ([place]) 11/27/2016  . Acute hypoxemic respiratory failure ([place])   . Stenosis of left carotid artery   . Acute ischemic stroke ([place]) 10/18/2016  . Cerebral infarction due to stenosis of right middle cerebral artery ([place]) 10/10/2016  . CVA (cerebral vascular accident) ([place]) 07/18/2016  . Stroke-like symptoms 07/18/2016  . Stroke ([place]) 07/18/2016  . Anemia 07/18/2016  . Non-English speaking patient 07/18/2016  . COPD, group B, by GOLD 2017 classification ([place])   . Nausea, vomiting and diarrhea 10/03/2015  . CKD (chronic kidney disease), stage III ([place]) 07/27/2015  . Carotid artery disease ([place]) 07/27/2015  . Hyponatremia 06/18/2014  . Paroxysmal atrial fibrillation ([place])  11/16/2013  . Ischemic cardiomyopathy 11/16/2013  . Headache 11/16/2013  . Chronic anticoagulation 11/16/2013  . Neck pain on left side 11/16/2013  . Chest pain at rest 11/16/2013  . Coronary artery disease 10/14/2013  . Chronic systolic heart failure ([place]) 10/14/2013  . AAA (abdominal aortic aneurysm) without rupture ([place]) 09/08/2013  . Hyperlipidemia 06/08/2013  . History of stroke 05/02/2013  . PVD (peripheral vascular disease) 3.5cm AAA Aug 2014 04/29/2013  . NSTEMI - ? type 2 - Troponin 0.63 04/28/2013  . Chronic bilateral lower abdominal pain 02/17/2013  . Hypertension     Past Medical History:  Past Medical History:  Diagnosis Date  . AAA (abdominal aortic aneurysm) ([place])   . Anemia   . Asthma   . Carotid artery disease ([place])   . CHF (congestive heart failure) ([place])   . Coronary artery disease   . Depression   . Diabetes mellitus type 2 in nonobese (HCC)   . Gout   . H. pylori infection   . Hard of hearing   . Hiatal hernia   . Hyperplasia, prostate   . Hypertension   . PUD (peptic ulcer disease)   . Renal failure, acute ([place]) 11/08/2012   Past Surgical History:  Past Surgical History:  Procedure Laterality Date  . CAROTID STENT INSERTION  2015  . CAROTID STENT INSERTION N/A 05/09/2013   Procedure: CAROTID STENT INSERTION;  Surgeon: Lorretta Harp, MD;  [facility] C[facility] LAB;  Service: Cardiovascular;  Laterality: N/A;  . ESOPHAGOGASTRODUODENOSCOPY  06/13/2008,12/31/12  . IR PERCUTANEOUS ART THROMBECTOMY/INFUSION INTRACRANIAL INC DIAG  ANGIO  11/27/2016  . LEFT HEART CATHETERIZATION WITH CORONARY ANGIOGRAM N/A 05/04/2013   Procedure: LEFT HEART CATHETERIZATION WITH CORONARY ANGIOGRAM;  Surgeon: Peter M Martinique, MD;  Location: [facility] CATH LAB;  Service: Cardiovascular;  Laterality: N/A;  . RADIOLOGY WITH ANESTHESIA N/A 11/27/2016   Procedure: IR WITH ANESTHESIA;  Surgeon: Luanne Bras, MD;  Location: [place];  Service: Radiology;  Laterality: N/A;    Assessment &  Plan Clinical Impression: An 83 y.o. right handed non-English speaking male with history of multiple strokes most recent 10/21/2017 as well as atrial fibrillation maintained on xarelto, diabetes mellitus, CKD stage III and diastolic congestive heart failure. Per chart review and daughter, patient lives with spouse. He plans to return to his daughter's home. Used a single-point cane and standard walker prior to admission. One level home with 3 steps to entry. Presented 11/27/2016 with acute onset of aphasia and right-sided weakness. CT reviewed, showing frontotemporal lobe infarct.  Per report, CT/MRI showed acute lateral left frontal lobe infarction. Extensive subacute and chronic ischemia including a large subacute left temporal lobe infarct new as compared to 10/18/2016. Small volume subarachnoid hemorrhage in the left perimesencephalic cistern . EEG evidence of mild to moderate generalized background slowing consistent with a bi-hemisphere malfunction. Recent echocardiogram with ejection fraction of 55% no wall motion abnormalities. Left common carotid arteriogram showed occluded left MCA and underwent revascularization per interventional radiology. Patient needed ventilatory support for a short time. Bouts of hypokalemia with potassium supplement added.  Chest x-ray 10/19 showed persistent right lower lobe airspace disease concerning for pneumonia currently maintained on broad antibiotic therapy. Currently on dysphagia #1 thin liquid diet with follow-up modified barium swallow completed 12/01/2016. MRSA PCR screen positive maintained on contact cautions Physical and occupational therapy evaluations completed with recommendations of physical medicine rehabilitation consult. Patient to be admitted for a comprehensive inpatient rehabilitation program. Patient transferred to CIR on 12/03/2016 .   Patient currently requires mod with mobility secondary to muscle weakness, impaired timing and sequencing, motor  apraxia, decreased coordination and decreased motor planning, decreased visual acuity, decreased visual perceptual skills and decreased visual motor skills, decreased attention to left, decreased attention to right and ideational apraxia, decreased initiation, decreased attention, decreased awareness, decreased problem solving, decreased safety awareness, decreased memory and delayed processing and decreased sitting balance, decreased standing balance, decreased postural control, hemiplegia and decreased balance strategies.  Prior to hospitalization, patient was requiring anywhere from supervision to min, maybe mod, assist for functional mobility and ADL tasks with mobility and lived with Family (wife and daughters) in a House home.  Home access is 3Stairs to enter.  Patient will benefit from skilled PT intervention to maximize safe functional mobility, minimize fall risk and decrease caregiver burden for planned discharge home with 24 hour assist.  Anticipate patient will not need PT follow up at discharge.  PT - End of Session Activity Tolerance: Tolerates 10 - 20 min activity with multiple rests Endurance Deficit: Yes PT Assessment Rehab Potential (ACUTE/IP ONLY): Fair PT Barriers to Discharge: Other (comments) PT Barriers to Discharge Comments: history of 9 CVAs PT Patient demonstrates impairments in the following area(s): Balance;Safety;Endurance;Motor;Perception PT Transfers Functional Problem(s): Bed Mobility;Bed to Chair;Car;Furniture PT Locomotion Functional Problem(s): Stairs;Ambulation PT Plan PT Intensity: Minimum of 1-2 x/day ,45 to 90 minutes PT Frequency: 5 out of 7 days PT Duration Estimated Length of Stay: 10-12 days PT Treatment/Interventions: Ambulation/gait training;Balance/vestibular training;Cognitive remediation/compensation;Discharge planning;DME/adaptive equipment instruction;Functional mobility training;Patient/family education;Neuromuscular re-education;Psychosocial  support;Splinting/orthotics;Therapeutic Exercise;Therapeutic Activities;Stair training;UE/LE Strength taining/ROM;UE/LE Coordination activities;Visual/perceptual remediation/compensation  PT Transfers Anticipated Outcome(s): min assist PT Locomotion Anticipated Outcome(s): min assist ambulatory with LRAD PT Recommendation Follow Up Recommendations: None;24 hour supervision/assistance Patient destination: Home Equipment Recommended: To be determined  Skilled Therapeutic Intervention No indication of pain based on faces scale.  PT initiated PT evaluation and family education regarding current level of function, goals of PT, plan of care, and safety plan.  Pt currently requires increased time and max/total cues for all mobility 2/2 significant impairments in cognition, perception, praxis, and coordination.  Pt needing anywhere from min/mod assist for transfers and gait this session.  Returned to room at end of session, positioned in bed with alarm activated, call bell in reach and family members present at bedside.   PT Evaluation Precautions/Restrictions Precautions Precautions: Fall Restrictions Weight Bearing Restrictions: No Home Living/Prior Functioning Home Living Available Help at Discharge: Family;Available 24 hours/day Type of Home: House Home Access: Stairs to enter [employer] of Steps: 3 Entrance Stairs-Rails: None Home Layout: One level Bathroom Shower/Tub: Tub only Biochemist, clinical: Standard Additional Comments: pt previously intermittently using cane or walker, varying need for assist for mobility and ADLs pending pt having "good and bad days" per family  Lives With: Family (wife and daughters) Prior Function Level of Independence: Needs assistance with ADLs;Needs assistance with gait;Needs assistance with tranfers  Able to Take Stairs?: Yes (with assist) Driving: No Leisure: Hobbies-yes (Comment) Comments: fishing Vision/Perception  Vision -  Assessment Additional Comments: Pt unable to visually attend >5 seconds, was able to locate OT by sound of voice, unable to fully assess tracking/fields d/t aphasia and language barrier Perception Perception: Impaired Inattention/Neglect: Does not attend to right side of body;Does not attend to left side of body;Impaired-to be further tested in functional context Praxis Praxis: Impaired Praxis Impairment Details: Initiation;Motor planning;Perseveration  Cognition Overall Cognitive Status: Impaired/Different from baseline Arousal/Alertness: Awake/alert Orientation Level:  (unable to assess due to cognitive/language Simultaneous filing. User may not have seen previous data.) Attention: Focused;Sustained (Simultaneous filing. User may not have seen previous data.) Focused Attention: Impaired Focused Attention Impairment: Verbal basic;Functional basic Sustained Attention: Impaired Sustained Attention Impairment: Verbal basic;Functional basic Awareness: Impaired Awareness Impairment: Intellectual impairment Problem Solving: Impaired Problem Solving Impairment: Functional basic Behaviors: Restless;Perseveration Safety/Judgment: Impaired Sensation Sensation Light Touch: Not tested Proprioception: Not tested Additional Comments: unable to formally assess due to cognitive deficits but appears to have intact sensation bilateral LEs and intact proprioception Coordination Gross Motor Movements are Fluid and Coordinated: No Fine Motor Movements are Fluid and Coordinated: No Motor  Motor Motor: Motor perseverations;Motor apraxia;Abnormal postural alignment and control  Mobility Bed Mobility Bed Mobility: Supine to Sit;Sit to Supine Supine to Sit: 4: Min assist Supine to Sit Details: Tactile cues for initiation;Tactile cues for weight shifting;Tactile cues for sequencing;Tactile cues for placement Sit to Supine: 4: Min assist Sit to Supine - Details: Tactile cues for initiation;Tactile cues  for weight shifting;Tactile cues for sequencing;Tactile cues for placement Sit to Supine - Details (indicate cue type and reason): pt requires significantly increased time  Transfers Transfers: Yes Stand Pivot Transfers: 4: Min assist Stand Pivot Transfer Details: Tactile cues for initiation;Tactile cues for weight shifting;Tactile cues for placement;Tactile cues for posture;Tactile cues for sequencing Stand Pivot Transfer Details (indicate cue type and reason): requires significantly increased time Locomotion  Ambulation Ambulation: Yes Ambulation/Gait Assistance: 3: Mod assist Ambulation Distance (Feet): 100 Feet Assistive device: 1 person hand held assist Ambulation/Gait Assistance Details: Tactile cues for initiation;Tactile cues for weight shifting;Tactile cues for sequencing;Tactile cues for posture;Tactile cues for  placement High Level Ambulation High Level Ambulation: Other high level ambulation High Level Ambulation - Other Comments: compliant surfaces Stairs / Additional Locomotion Stairs: No Wheelchair Mobility Wheelchair Mobility: No  Trunk/Postural Assessment  Cervical Assessment Cervical Assessment:  (forward head) Thoracic Assessment Thoracic Assessment:  (rounded shoulders, mildly kyphotic) Lumbar Assessment Lumbar Assessment:  (decreased lumbar lordosis, posterior pelvic tilt) Postural Control Postural Control: Deficits on evaluation Righting Reactions: delayed Protective Responses: delayed  Balance Balance Balance Assessed: Yes Static Sitting Balance Static Sitting - Balance Support: No upper extremity supported Static Sitting - Level of Assistance: 5: Stand by assistance Dynamic Sitting Balance Dynamic Sitting - Balance Support: Feet supported Dynamic Sitting - Level of Assistance: 4: Min assist Static Standing Balance Static Standing - Balance Support: Left upper extremity supported;Right upper extremity supported;During functional activity Static  Standing - Level of Assistance: 4: Min assist Dynamic Standing Balance Dynamic Standing - Balance Support: Left upper extremity supported;Right upper extremity supported;During functional activity Dynamic Standing - Level of Assistance: 3: Mod assist Extremity Assessment      RLE Assessment RLE Assessment: Exceptions to [facility] RLE AROM (degrees) RLE Overall AROM Comments: appears WFL assessed in sitting RLE Strength RLE Overall Strength Comments: appears functional for hip/knee in sitting, unable to formally assess due to language/cognitive deficits but pt able to extend knees against gravity and return feet slowly to floor, able to take some resistance LLE Assessment LLE Assessment: Exceptions to WFL LLE AROM (degrees) LLE Overall AROM Comments: appears WFL assessed in sitting LLE Strength LLE Overall Strength Comments: appears functional for hip/knee in sitting, unable to formally assess due to language/cognitive deficits but pt able to extend knees against gravity and return feet slowly to floor, able to take some resistance   See Function Navigator for Current Functional Status.   Refer to Care Plan for Long Term Goals  Recommendations for other services: None   Discharge Criteria: Patient will be discharged from PT if patient refuses treatment 3 consecutive times without medical reason, if treatment goals not met, if there is a change in medical status, if patient makes no progress towards goals or if patient is discharged from hospital.  The above assessment, treatment plan, treatment alternatives and goals were discussed and mutually agreed upon: by patient and by family  Michel Santee 12/04/2016, 3:49 PM

## 2016-12-04 NOTE — Evaluation (Signed)
Occupational Therapy Assessment and Plan  Patient Details  Name: [name] MRN: [id number] Date of Birth: 1926/03/02  OT Diagnosis: cognitive deficits, disturbance of vision and muscle weakness (generalized) Rehab Potential: Rehab Potential (ACUTE ONLY): Fair ELOS: 10-14 days   Today's Date: 12/04/2016 OT Individual Time: 0569-7948 OT Individual Time Calculation (min): 27 min     Problem List:  Patient Active Problem List   Diagnosis Date Noted  . Left middle cerebral artery stroke ([place]) 12/03/2016  . Cerebral infarction due to embolism of cerebral artery ([place])   . Chronic atrial fibrillation ([place])   . History of CVA (cerebrovascular accident)   . Dysphagia, post-stroke   . Chronic diastolic congestive heart failure ([place])   . Uncomplicated asthma   . History of gout   . Persistent atrial fibrillation ([place])   . Aphasia   . HCAP (healthcare-associated pneumonia)   . SOB (shortness of breath)   . PAF (paroxysmal atrial fibrillation) ([place])   . Diabetes mellitus type 2 in nonobese (HCC)   . Stage 3 chronic kidney disease ([place])   . Tachypnea   . Benign essential HTN   . Hypokalemia   . Acute blood loss anemia   . Cerebral thrombosis with cerebral infarction 11/27/2016  . Stroke (cerebrum) ([place]) 11/27/2016  . Acute hypoxemic respiratory failure ([place])   . Stenosis of left carotid artery   . Acute ischemic stroke ([place]) 10/18/2016  . Cerebral infarction due to stenosis of right middle cerebral artery ([place]) 10/10/2016  . CVA (cerebral vascular accident) ([place]) 07/18/2016  . Stroke-like symptoms 07/18/2016  . Stroke ([place]) 07/18/2016  . Anemia 07/18/2016  . Non-English speaking patient 07/18/2016  . COPD, group B, by GOLD 2017 classification ([place])   . Nausea, vomiting and diarrhea 10/03/2015  . CKD (chronic kidney disease), stage III ([place]) 07/27/2015  . Carotid artery disease ([place]) 07/27/2015  . Hyponatremia 06/18/2014  . Paroxysmal atrial fibrillation ([place]) 11/16/2013   . Ischemic cardiomyopathy 11/16/2013  . Headache 11/16/2013  . Chronic anticoagulation 11/16/2013  . Neck pain on left side 11/16/2013  . Chest pain at rest 11/16/2013  . Coronary artery disease 10/14/2013  . Chronic systolic heart failure ([place]) 10/14/2013  . AAA (abdominal aortic aneurysm) without rupture ([place]) 09/08/2013  . Hyperlipidemia 06/08/2013  . History of stroke 05/02/2013  . PVD (peripheral vascular disease) 3.5cm AAA Aug 2014 04/29/2013  . NSTEMI - ? type 2 - Troponin 0.63 04/28/2013  . Chronic bilateral lower abdominal pain 02/17/2013  . Hypertension     Past Medical History:  Past Medical History:  Diagnosis Date  . AAA (abdominal aortic aneurysm) ([place])   . Anemia   . Asthma   . Carotid artery disease ([place])   . CHF (congestive heart failure) ([place])   . Coronary artery disease   . Depression   . Diabetes mellitus type 2 in nonobese (HCC)   . Gout   . H. pylori infection   . Hard of hearing   . Hiatal hernia   . Hyperplasia, prostate   . Hypertension   . PUD (peptic ulcer disease)   . Renal failure, acute ([place]) 11/08/2012   Past Surgical History:  Past Surgical History:  Procedure Laterality Date  . CAROTID STENT INSERTION  2015  . CAROTID STENT INSERTION N/A 05/09/2013   Procedure: CAROTID STENT INSERTION;  Surgeon: Lorretta Harp, MD;  Location: [facility] CATH LAB;  Service: Cardiovascular;  Laterality: N/A;  . ESOPHAGOGASTRODUODENOSCOPY  06/13/2008,12/31/12  . IR PERCUTANEOUS ART THROMBECTOMY/INFUSION INTRACRANIAL INC DIAG  ANGIO  11/27/2016  . LEFT HEART CATHETERIZATION WITH CORONARY ANGIOGRAM N/A 05/04/2013   Procedure: LEFT HEART CATHETERIZATION WITH CORONARY ANGIOGRAM;  Surgeon: Peter M Martinique, MD;  Location: [facility] CATH LAB;  Service: Cardiovascular;  Laterality: N/A;  . RADIOLOGY WITH ANESTHESIA N/A 11/27/2016   Procedure: IR WITH ANESTHESIA;  Surgeon: Luanne Bras, MD;  Location: [facility];  Service: Radiology;  Laterality: N/A;    Assessment &  Plan Clinical Impression: Patient is a 83 y.o. year old male with recent admission to the hospital on 11/27/2016 with acute onset of aphasia and right-sided weakness. CT reviewed, showing frontotemporal lobe infarct. Per report, CT/MRI showed acute lateral left frontal lobe infarction. Extensive subacute and chronic ischemia including a large subacute left temporal lobe infarct new as compared to 10/18/2016. Small volume subarachnoid hemorrhage in the left perimesencephalic cistern . EEG evidence of mild to moderate generalized background slowing consistent with a bi-hemisphere malfunction. Recent echocardiogram with ejection fraction of 55% no wall motion abnormalities. Left common carotid arteriogram showed occluded left MCA and underwent revascularization per interventional radiology. Patient needed ventilatory support for a short time. Plan is to begin Coumadin 12/06/2016 with goal INR of 2.5-3.5 Bouts of hypokalemia with potassium supplement added.  Chest x-ray 10/19 showed persistent right lower lobe airspace disease concerning for pneumonia currently maintained on broad antibiotic therapy with vancomycin and Zosyn as well as prednisone therapy with taper. Currently on dysphagia #1 thin liquid diet with follow-up modified barium swallow completed 12/01/2016. MRSA PCR screen positive maintained on contact cautions.  Patient transferred to CIR on 12/03/2016 .    Patient currently requires total with basic self-care skills secondary to impaired timing and sequencing, decreased coordination and decreased motor planning and decreased initiation, decreased attention, decreased awareness, decreased problem solving and delayed processing.  Prior to hospitalization, patient could complete self-care tasks with variable levels of assistance.  Patient will benefit from skilled intervention to increase independence with basic self-care skills prior to discharge home with care partner.  Anticipate patient will require  24 hour supervision and follow up home health.  OT - End of Session Endurance Deficit: Yes Endurance Deficit Description: Unable to tolerate OT evaluation due to fatigue OT Assessment Rehab Potential (ACUTE ONLY): Fair OT Patient demonstrates impairments in the following area(s): Balance;Cognition;Endurance;Motor;Perception;Safety;Vision OT Basic ADL's Functional Problem(s): Eating;Grooming;Bathing;Dressing;Toileting OT Transfers Functional Problem(s): Toilet;Tub/Shower OT Plan OT Intensity: Minimum of 1-2 x/day, 45 to 90 minutes OT Frequency: 5 out of 7 days OT Duration/Estimated Length of Stay: 10-14 days OT Treatment/Interventions: Balance/vestibular training;Cognitive remediation/compensation;Discharge planning;DME/adaptive equipment instruction;Functional mobility training;Neuromuscular re-education;Patient/family education;Psychosocial support;Self Care/advanced ADL retraining;Therapeutic Activities;Therapeutic Exercise;UE/LE Strength taining/ROM;UE/LE Coordination activities;Visual/perceptual remediation/compensation OT Self Feeding Anticipated Outcome(s): S OT Basic Self-Care Anticipated Outcome(s): CGA OT Toileting Anticipated Outcome(s): CGA OT Bathroom Transfers Anticipated Outcome(s): CGA OT Recommendation Patient destination: Home Follow Up Recommendations: Home health OT Equipment Details: TBD   Skilled Therapeutic Intervention Pt supine in bed upon OT arrival and pt's family was resistant to pt participating fully in OT evaluation due to fatigue.  Mild verbal agitation noted with restlessness as OT attempted to engage pt.  Pt transitioned to sitting EOB with Max A.  Tolerated sitting EOB with CGA.  Total A required to don shirt and pants due to aphasia and visual deficits.  Pt's family declined pt participating in bathing or further tasks at this time.  OT Evaluation Precautions/Restrictions  Precautions Precautions: Fall Restrictions Weight Bearing Restrictions:  No General OT Amount of Missed Time: 33 Minutes PT Missed Treatment Reason: Patient fatigue  Vital Signs Therapy Vitals Temp: 98.1 F (36.7 C) Temp Source: Oral Pulse Rate: 86 Resp: 18 BP: (!) 149/78 Patient Position (if appropriate): Lying Oxygen Therapy SpO2: 100 % O2 Device: Not Delivered Pain Pain Assessment Faces Pain Scale: No hurt Home Living/Prior Functioning Home Living Family/patient expects to be discharged to:: Private residence Living Arrangements: Spouse/significant other, Children Available Help at Discharge: Family, Available 24 hours/day Type of Home: House Home Access: Stairs to enter Technical brewer of Steps: 3 Entrance Stairs-Rails: None Home Layout: One level Bathroom Shower/Tub: Tub only Biochemist, clinical: Standard Additional Comments: pt previously intermittently using cane or walker, varying need for assist for mobility and ADLs pending pt having "good and bad days" per family  Lives With: Family (wife and daughters) IADL History Homemaking Responsibilities: No IADL Comments: Max-total A at baseline with IADLs Prior Function Level of Independence: Needs assistance with ADLs, Needs assistance with gait, Needs assistance with tranfers  Able to Take Stairs?: Yes (with assist) Driving: No Leisure: Hobbies-yes (Comment) Comments: fishing ADL   Vision Baseline Vision/History:  (Unable to assess d/t language barrier) Patient Visual Report:  (Unable to report) Vision Assessment?: Vision impaired- to be further tested in functional context Additional Comments: Pt unable to visually attend >5 seconds, was able to locate OT by sound of voice, unable to fully assess tracking/fields d/t aphasia and language barrier Perception  Perception: Impaired Inattention/Neglect: Does not attend to right side of body;Does not attend to left side of body;Impaired-to be further tested in functional context Praxis Praxis: Impaired Praxis Impairment Details:  Initiation;Motor planning;Perseveration Cognition Overall Cognitive Status: Impaired/Different from baseline Arousal/Alertness: Awake/alert Orientation Level:  (Unable to assess or participate in BIMs) Attention: Focused;Sustained (Simultaneous filing. User may not have seen previous data.) Focused Attention: Impaired Focused Attention Impairment: Verbal basic;Functional basic Sustained Attention: Impaired Sustained Attention Impairment: Verbal basic;Functional basic Awareness: Impaired Awareness Impairment: Intellectual impairment Problem Solving: Impaired Problem Solving Impairment: Functional basic Behaviors: Restless;Perseveration Safety/Judgment: Impaired Sensation Sensation Light Touch: Not tested Proprioception: Not tested Additional Comments: Unable to assess d/t aphasia and inability to follow single step commands Coordination Gross Motor Movements are Fluid and Coordinated: No Fine Motor Movements are Fluid and Coordinated: No Coordination and Movement Description: Unable to touch targets with BUEs likely due to both coordination and visual deficits Finger Nose Finger Test: Unable to assess Motor  Motor Motor: Motor perseverations;Motor apraxia;Abnormal postural alignment and control Mobility  Bed Mobility Bed Mobility: Supine to Sit;Sit to Supine Supine to Sit: 2: Max assist Supine to Sit Details: Tactile cues for initiation;Tactile cues for weight shifting;Tactile cues for sequencing;Tactile cues for placement Sit to Supine: 3: Mod assist Sit to Supine - Details: Tactile cues for initiation;Tactile cues for weight shifting;Tactile cues for sequencing;Tactile cues for placement Sit to Supine - Details (indicate cue type and reason): pt requires significantly increased time  Transfers Transfers: Sit to Stand Sit to Stand: 3: Mod assist Sit to Stand Details: Tactile cues for initiation;Tactile cues for sequencing;Tactile cues for weight shifting;Tactile cues for  placement;Visual cues/gestures for sequencing  Trunk/Postural Assessment  Cervical Assessment Cervical Assessment:  (forward head) Thoracic Assessment Thoracic Assessment:  (rounded shoulders, mildly kyphotic) Lumbar Assessment Lumbar Assessment:  (decreased lumbar lordosis, posterior pelvic tilt) Postural Control Postural Control: Deficits on evaluation Righting Reactions: delayed Protective Responses: delayed  Balance Balance Balance Assessed: Yes Static Sitting Balance Static Sitting - Balance Support: No upper extremity supported Static Sitting - Level of Assistance: 4: Min assist Dynamic Sitting Balance Dynamic Sitting - Balance Support: Feet supported  Dynamic Sitting - Level of Assistance: 4: Min Insurance risk surveyor Standing - Balance Support: Left upper extremity supported;Right upper extremity supported;During functional activity Static Standing - Level of Assistance: 4: Min assist Dynamic Standing Balance Dynamic Standing - Balance Support: Left upper extremity supported;Right upper extremity supported;During functional activity Dynamic Standing - Level of Assistance: 4: Min assist Extremity/Trunk Assessment RUE Assessment RUE Assessment: Exceptions to WFL (Unable to accurately assess, PROM WFL, MMT functionally 3/5) LUE Assessment LUE Assessment: Exceptions to [facility] (Unable to accurately assess, PROM WFL, MMT functionally 3/5)   See Function Navigator for Current Functional Status.   Refer to Care Plan for Long Term Goals  Recommendations for other services: None    Discharge Criteria: Patient will be discharged from OT if patient refuses treatment 3 consecutive times without medical reason, if treatment goals not met, if there is a change in medical status, if patient makes no progress towards goals or if patient is discharged from hospital.  The above assessment, treatment plan, treatment alternatives and goals were discussed and mutually agreed  upon: No family available/patient unable  Marcella Dubs 12/04/2016, 4:02 PM

## 2016-12-04 NOTE — Evaluation (Signed)
Speech Language Pathology Assessment and Plan  Patient Details  Name: [name] MRN: [id number] Date of Birth: 1927-01-05  SLP Diagnosis: Aphasia;Dysphagia;Apraxia;Cognitive Impairments  Rehab Potential: Fair ELOS: 10-12 days     Today's Date: 12/04/2016 SLP Individual Time: 0900-1000 SLP Individual Time Calculation (min): 60 min   Problem List:  Patient Active Problem List   Diagnosis Date Noted  . Left middle cerebral artery stroke ([place]) 12/03/2016  . Cerebral infarction due to embolism of cerebral artery ([place])   . Chronic atrial fibrillation ([place])   . History of CVA (cerebrovascular accident)   . Dysphagia, post-stroke   . Chronic diastolic congestive heart failure ([place])   . Uncomplicated asthma   . History of gout   . Persistent atrial fibrillation ([place])   . Aphasia   . HCAP (healthcare-associated pneumonia)   . SOB (shortness of breath)   . PAF (paroxysmal atrial fibrillation) ([place])   . Diabetes mellitus type 2 in nonobese (HCC)   . Stage 3 chronic kidney disease ([place])   . Tachypnea   . Benign essential HTN   . Hypokalemia   . Acute blood loss anemia   . Cerebral thrombosis with cerebral infarction 11/27/2016  . Stroke (cerebrum) ([place]) 11/27/2016  . Acute hypoxemic respiratory failure ([place])   . Stenosis of left carotid artery   . Acute ischemic stroke ([place]) 10/18/2016  . Cerebral infarction due to stenosis of right middle cerebral artery ([place]) 10/10/2016  . CVA (cerebral vascular accident) ([place]) 07/18/2016  . Stroke-like symptoms 07/18/2016  . Stroke ([place]) 07/18/2016  . Anemia 07/18/2016  . Non-English speaking patient 07/18/2016  . COPD, group B, by GOLD 2017 classification ([place])   . Nausea, vomiting and diarrhea 10/03/2015  . CKD (chronic kidney disease), stage III ([place]) 07/27/2015  . Carotid artery disease ([place]) 07/27/2015  . Hyponatremia 06/18/2014  . Paroxysmal atrial fibrillation ([place]) 11/16/2013  . Ischemic cardiomyopathy 11/16/2013  .  Headache 11/16/2013  . Chronic anticoagulation 11/16/2013  . Neck pain on left side 11/16/2013  . Chest pain at rest 11/16/2013  . Coronary artery disease 10/14/2013  . Chronic systolic heart failure ([place]) 10/14/2013  . AAA (abdominal aortic aneurysm) without rupture ([place]) 09/08/2013  . Hyperlipidemia 06/08/2013  . History of stroke 05/02/2013  . PVD (peripheral vascular disease) 3.5cm AAA Aug 2014 04/29/2013  . NSTEMI - ? type 2 - Troponin 0.63 04/28/2013  . Chronic bilateral lower abdominal pain 02/17/2013  . Hypertension    Past Medical History:  Past Medical History:  Diagnosis Date  . AAA (abdominal aortic aneurysm) ([place])   . Anemia   . Asthma   . Carotid artery disease ([place])   . CHF (congestive heart failure) ([place])   . Coronary artery disease   . Depression   . Diabetes mellitus type 2 in nonobese (HCC)   . Gout   . H. pylori infection   . Hard of hearing   . Hiatal hernia   . Hyperplasia, prostate   . Hypertension   . PUD (peptic ulcer disease)   . Renal failure, acute ([place]) 11/08/2012   Past Surgical History:  Past Surgical History:  Procedure Laterality Date  . CAROTID STENT INSERTION  2015  . CAROTID STENT INSERTION N/A 05/09/2013   Procedure: CAROTID STENT INSERTION;  Surgeon: Lorretta Harp, MD;  Location: [facility] CATH LAB;  Service: Cardiovascular;  Laterality: N/A;  . ESOPHAGOGASTRODUODENOSCOPY  06/13/2008,12/31/12  . IR PERCUTANEOUS ART THROMBECTOMY/INFUSION INTRACRANIAL INC DIAG ANGIO  11/27/2016  . LEFT HEART CATHETERIZATION WITH CORONARY  ANGIOGRAM N/A 05/04/2013   Procedure: LEFT HEART CATHETERIZATION WITH CORONARY ANGIOGRAM;  Surgeon: Peter M Martinique, MD;  Location: [facility] CATH LAB;  Service: Cardiovascular;  Laterality: N/A;  . RADIOLOGY WITH ANESTHESIA N/A 11/27/2016   Procedure: IR WITH ANESTHESIA;  Surgeon: Luanne Bras, MD;  Location: [place];  Service: Radiology;  Laterality: N/A;    Assessment / Plan / Recommendation Clinical Impression Patient is a  83 y.o.right handednon-English speaking malewith history of multiple strokes most recent 10/21/2017 as well as atrial fibrillation maintained on xarelto, diabetes mellitus, CKD stage III and diastolic congestive heart failure. Per chart review and daughter, patient lives with spouse. He plans to return to his daughter's home. Used a single-point cane and standard walker prior to admission. One level home with 3 steps to entry. Presented 11/27/2016 with acute onset of aphasia and right-sided weakness. CT reviewed, showing frontotemporal lobe infarct. Per report, CT/MRI showed acute lateral left frontal lobe infarction. Extensive subacute and chronic ischemia including a large subacute left temporal lobe infarct new as compared to 10/18/2016. Small volume subarachnoid hemorrhage in the left perimesencephalic cistern . EEG evidence of mild to moderate generalized background slowing consistent with a bi-hemisphere malfunction. Recent echocardiogram with ejection fraction of 55% no wall motion abnormalities. Left common carotid arteriogram showed occluded left MCA and underwent revascularization per interventional radiology. Patient needed ventilatory support for a short time. Plan is to begin Coumadin 12/06/2016 with goal INR of 2.5-3.5 Bouts of hypokalemia with potassium supplement added.  Chest x-ray 10/19 showed persistent right lower lobe airspace disease concerning for pneumonia currently maintained on broad antibiotic therapy with vancomycin and Zosyn as well as prednisone therapy with taper. Currently on dysphagia #1 thin liquid diet with follow-up modified barium swallow completed 12/01/2016. MRSA PCR screen positive maintained on contact cautions Physical and occupational therapy evaluations completed with recommendations of physical medicine rehabilitation consult. Patient was admitted for a comprehensive rehabilitation program 12/03/16.  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 Patient was perseverative on the number  "2" throughout session and was unable to verbalize/vocalize any other sound despite Max A multimodal cues. Patient also required total A to answer basic yes/no questions, follow commands and identify functional objects. Patient also required Max A verbal cues for sustained attention and visual scanning/attention throughout functional tasks. Patient consumed Dys. 1 textures with thin liquids with what appeared to be a timely swallow initiation but demonstrated delayed throat clearing X 2, suspect due to large sips. Throat clearing was eliminated with small, single sips. Recommend patient continue Dys. 1 textures with thin liquids with full supervision. Patient would benefit from skilled SLP intervention to maximize his swallowing and cognitive function as well as his functional communication in order to maximize his overall independence and reduce caregiver burden.    Skilled Therapeutic Interventions          Administered a cognitive-linguistic evaluation and BSE. Please see above for details.   SLP Assessment  Patient will need skilled Speech Lanaguage Pathology Services during CIR admission    Recommendations  SLP Diet Recommendations: Dysphagia 1 (Puree);Thin Liquid Administration via: Cup Medication Administration: Crushed with puree Supervision: Staff to assist with self feeding;Full supervision/cueing for compensatory strategies Compensations: Slow rate;Small sips/bites;Monitor for anterior loss Postural Changes and/or Swallow Maneuvers: Seated upright 90 degrees Oral Care Recommendations: Oral care BID Patient destination: Home Follow up Recommendations: 24 hour supervision/assistance;Home Health SLP Equipment Recommended: None recommended by SLP    SLP Frequency 3 to  5 out of 7 days   SLP Duration  SLP Intensity  SLP Treatment/Interventions 10-12 days   Minumum of 1-2 x/day, 30 to 90  minutes  Cognitive remediation/compensation;Dysphagia/aspiration precaution training;Internal/external aids;Speech/Language facilitation;Therapeutic Activities;Environmental controls;Cueing hierarchy;Functional tasks;Patient/family education    Pain Pain Assessment Faces Pain Scale: No hurt  Prior Functioning Type of Home: House  Lives With: Family (wife and daughters) Available Help at Discharge: Family;Available 24 hours/day  Function:  Cognition Comprehension Comprehension assist level: Understands basic less than 25% of the time/ requires cueing >75% of the time  Expression   Expression assist level: Expresses basis less than 25% of the time/requires cueing >75% of the time.  Social Interaction Social Interaction assist level: Interacts appropriately 25 - 49% of time - Needs frequent redirection.  Problem Solving Problem solving assist level: Solves basic less than 25% of the time - needs direction nearly all the time or does not effectively solve problems and may need a restraint for safety  Memory Memory assist level: Recognizes or recalls less than 25% of the time/requires cueing greater than 75% of the time   Short Term Goals: Week 1: SLP Short Term Goal 1 (Week 1): Patient will consume current diet with minimal overt s/s of aspiration and Max A verbal cues for use of swallowing compensatory strategies.  SLP Short Term Goal 2 (Week 1): Patient will demonstrate efficient mastication and complete oral clearance without overt s/s of aspiration with trials of Dys. 2 textures over 2 sessiosn prior to upgrade.  SLP Short Term Goal 3 (Week 1): Patient will imitate vowels in 25% of opportunities with Max A multimodal cues.  SLP Short Term Goal 4 (Week 1): Patient will answer basic yes/no questions via multimodal communication with 25% accuracy with Max A multimodal cues.  SLP Short Term Goal 5 (Week 1): Patient will identify functional objects with 25% accuracy and Max A multimodal cues.   SLP Short Term Goal 6 (Week 1): Patient will demonstrate sustained attention to tasks for 10 minutes with Max A verbal cues for redirection.   Refer to Care Plan for Long Term Goals  Recommendations for other services: None   Discharge Criteria: Patient will be discharged from SLP if patient refuses treatment 3 consecutive times without medical reason, if treatment goals not met, if there is a change in medical status, if patient makes no progress towards goals or if patient is discharged from hospital.  The above assessment, treatment plan, treatment alternatives and goals were discussed and mutually agreed upon: by family  [name], Josephine 12/04/2016, 3:52 PM

## 2016-12-05 ENCOUNTER — Inpatient Hospital Stay (HOSPITAL_COMMUNITY): Payer: Medicare Other | Admitting: Physical Therapy

## 2016-12-05 ENCOUNTER — Inpatient Hospital Stay (HOSPITAL_COMMUNITY): Payer: Medicare Other

## 2016-12-05 ENCOUNTER — Inpatient Hospital Stay (HOSPITAL_COMMUNITY): Payer: Medicare Other | Admitting: Speech Pathology

## 2016-12-05 LAB — URINE CULTURE: Culture: NO GROWTH

## 2016-12-05 LAB — GLUCOSE, CAPILLARY
GLUCOSE-CAPILLARY: 242 mg/dL — AB (ref 65–99)
GLUCOSE-CAPILLARY: 225 mg/dL — AB (ref 65–99)
GLUCOSE-CAPILLARY: 217 mg/dL — AB (ref 65–99)
Glucose-Capillary: 141 mg/dL — ABNORMAL HIGH (ref 65–99)

## 2016-12-05 NOTE — Progress Notes (Signed)
Occupational Therapy Session Note  Patient Details  Name: [name] MRN: [id number] Date of Birth: 05-08-1926  Today's Date: 12/05/2016 OT Individual Time: 1100-1155 OT Individual Time Calculation (min): 55 min    Short Term Goals: Week 1:  OT Short Term Goal 1 (Week 1): Pt will follow single step commands during dressing tasks. OT Short Term Goal 2 (Week 1): Pt will notify staff of need to toilet. OT Short Term Goal 3 (Week 1): Pt will complete stand pivot toilet transfer with CGA. OT Short Term Goal 4 (Week 1): Pt will visually identify necessary grooming items to complete grooming routine.  Skilled Therapeutic Interventions/Progress Updates:    Pt on [facility] upon arrival with daughter present.  Pt required tot A for toileting tasks and steady A (HHA) for amb to room.  Pt required max demonstration cues to wash hands.  Pt required HOH A to use RUE (R inattention vs aphasia?). Pt required max verbal cues to sit EOB and lay back into bed.  Pt refused bathing and changing clothing this morning. [name] interpreted and provided info regarding bathing habits at home.  Will attempt to simulate home bathing arrangements in subsequent sessions.  Pt remained in bed with bed alarm activated and family present.   Therapy Documentation Precautions:  Precautions Precautions: Fall Restrictions Weight Bearing Restrictions: No Pain:  Pt denies pain  See Function Navigator for Current Functional Status.   Therapy/Group: Individual Therapy  Rich Brave 12/05/2016, 11:59 AM

## 2016-12-05 NOTE — Progress Notes (Signed)
[facility] PHYSICAL MEDICINE & REHABILITATION     PROGRESS NOTE    Subjective/Complaints: Up with PT in gym. No new issues.   ROS: limited due to language/communication    Objective: Vital Signs: Blood pressure (!) 150/84, pulse 97, temperature 97.9 F (36.6 C), temperature source Oral, resp. rate 16, height 5\' 5"  (1.651 m), weight 64.3 kg (141 lb 11.2 oz), SpO2 98 %. No results found.  Recent Labs  12/03/16 0222 12/04/16 0659  WBC 8.3 9.3  HGB 8.3* 8.9*  HCT 24.7* 27.1*  PLT 175 189    Recent Labs  12/03/16 0222 12/04/16 0659  NA 132* 132*  K 3.2* 3.1*  CL 97* 96*  GLUCOSE 217* 174*  BUN 25* 21*  CREATININE 1.45* 1.49*  CALCIUM 8.5* 8.2*   CBG (last 3)   Recent Labs  12/04/16 1642 12/04/16 2105 12/05/16 0624  GLUCAP 284* 245* 141*    Wt Readings from Last 3 Encounters:  12/03/16 64.3 kg (141 lb 11.2 oz)  11/27/16 63.9 kg (140 lb 14 oz)  11/05/16 64.9 kg (143 lb)    Physical Exam:  Constitutional: He appears well-developed.  83 year old right-handed male Frail  HENT:  Head: Normocephalic and atraumatic.  Eyes: EOM are normal. Right eye exhibits no discharge. Left eye exhibits no discharge.  Neck: Normal range of motion. Neck supple. No thyromegaly present.  Cardiovascular:  IRR IRR Respiratory: CTA Bilaterally without wheezes or rales. Normal effort   GI: Soft. Bowel sounds are normal. He exhibits no distension.  Musculoskeletal: He exhibits no edema or tenderness.  Neurological: He is alert.  Slow processing .  Moves all 4 limbs during functional activities.   Skin: Skin is warm and dry.  Psychiatric:  Unable to assess due to language, flat  Assessment/Plan: 1. Right hemiparesis and functional deficits secondary to left MCA infarct which require 3+ hours per day of interdisciplinary therapy in a comprehensive inpatient rehab setting. Physiatrist is providing close team supervision and 24 hour management of active medical problems listed  below. Physiatrist and rehab team continue to assess barriers to discharge/monitor patient progress toward functional and medical goals.  Function:  Bathing Bathing position Bathing activity did not occur: Refused    Bathing parts      Bathing assist        Upper Body Dressing/Undressing Upper body dressing   What is the patient wearing?: Pull over shirt/dress       Pull over shirt/dress - Perfomed by helper: Thread/unthread right sleeve, Thread/unthread left sleeve, Put head through opening, Pull shirt over trunk        Upper body assist Assist Level: Touching or steadying assistance(Pt > 75%)      Lower Body Dressing/Undressing Lower body dressing   What is the patient wearing?: Pants, Non-skid slipper socks       Pants- Performed by helper: Thread/unthread right pants leg, Thread/unthread left pants leg, Pull pants up/down, Fasten/unfasten pants   Non-skid slipper socks- Performed by helper: Don/doff right sock, Don/doff left sock                  Lower body assist Assist for lower body dressing: Touching or steadying assistance (Pt > 75%)      Toileting Toileting Toileting activity did not occur: No continent bowel/bladder event        Toileting assist     Transfers Chair/bed transfer   Chair/bed transfer method: Stand pivot Chair/bed transfer assist level: Touching or steadying assistance (Pt > 75%) Chair/bed transfer  assistive device: Bedrails, Armrests     Locomotion Ambulation     Max distance: 100 Assist level: Moderate assist (Pt 50 - 74%)   Wheelchair          Cognition Comprehension Comprehension assist level: Understands basic less than 25% of the time/ requires cueing >75% of the time  Expression Expression assist level: Expresses basis less than 25% of the time/requires cueing >75% of the time.  Social Interaction Social Interaction assist level: Interacts appropriately 25 - 49% of time - Needs frequent redirection.  Problem  Solving Problem solving assist level: Solves basic less than 25% of the time - needs direction nearly all the time or does not effectively solve problems and may need a restraint for safety  Memory Memory assist level: Recognizes or recalls less than 25% of the time/requires cueing greater than 75% of the time   Medical Problem List and Plan: 1.  Right sided weakness with aphasia secondary to left MCA infarction embolic secondary to proximal M2 occlusion status post thrombectomy with  revascularization with history of multiple CVAs  -continue therapies  -speaks no English 2.  DVT Prophylaxis/Anticoagulation: PLAN TO BEGIN COUMADIN 12/06/2016 with goal INR of 2.5-3.5 3. Pain Management: Tylenol as needed 4. Mood: Provide emotional support 5. Neuropsych: This patient is capable of making decisions on his own behalf. 6. Skin/Wound Care: Routine skin checks 7. Fluids/Electrolytes/Nutrition: encourage PO  -replacing potassium 8. Dysphagia. Dysphagia #1 thin liquid. Follow-up speech therapy 9. Pneumonia. Zosyn/vancomycin  as directed.   -Complete course of prednisone with slow taper 10. Atrial fibrillation. Patient on Xarelto prior to admission. Discuss when to resume anticoagulation of Coumadin to begin the weekend of 12/06/2016. Cardiac rate controlled 11. CKD stage III. Follow-up chemistries serially 12. Diastolic congestive heart failure. Monitor for any signs of fluid overload 13. Hypertension. Coreg 6.25 mg twice a day 14. Diabetes mellitus and peripheral neuropathy. Hemoglobin A1c 6.0.SSI 15. MRSA PCR screen positive. Contact precautions 16. Hyperlipidemia. Lipitor 17. Asthma. Continue nebulizers as directed 18. Gout. Zyloprim 300 mg daily 19. ?Dysuria:   observe for patterns, urine with mild odor    -ucx pending  LOS (Days) 2 A FACE TO FACE EVALUATION WAS PERFORMED  Faith RogueSWARTZ,ZACHARY T, MD 12/05/2016 9:07 AM

## 2016-12-05 NOTE — Progress Notes (Signed)
Speech Language Pathology Daily Session Note  Patient Details  Name: [name] MRN: [id number] Date of Birth: August 20, 1926  Today's Date: 12/05/2016 SLP Individual Time: 1300-1400 SLP Individual Time Calculation (min): 60 min  Short Term Goals: Week 1: SLP Short Term Goal 1 (Week 1): Patient will consume current diet with minimal overt s/s of aspiration and Max A verbal cues for use of swallowing compensatory strategies.  SLP Short Term Goal 2 (Week 1): Patient will demonstrate efficient mastication and complete oral clearance without overt s/s of aspiration with trials of Dys. 2 textures over 2 sessiosn prior to upgrade.  SLP Short Term Goal 3 (Week 1): Patient will imitate vowels in 25% of opportunities with Max A multimodal cues.  SLP Short Term Goal 4 (Week 1): Patient will answer basic yes/no questions via multimodal communication with 25% accuracy with Max A multimodal cues.  SLP Short Term Goal 5 (Week 1): Patient will identify functional objects with 25% accuracy and Max A multimodal cues.  SLP Short Term Goal 6 (Week 1): Patient will demonstrate sustained attention to tasks for 10 minutes with Max A verbal cues for redirection.   Skilled Therapeutic Interventions: Skilled treatment session focused on functional communication. Upon arrival, patient was asleep in bed but easily awakened. Patient indicated he needed to utilize the bathroom but was unable to void once on [facility]. Patient required total A to answer basic yes/no questions in regards to biographical information and to identify functional objects from a field of 2 with continued verbal perseveration on numbers. However, patient demonstrated increased spontaneous responses and appeared more awake/alert with increased initiation of functional tasks (washing hands, pulling up pants, etc). Patient with consistent coughing/hacking with expectoration of mucous throughout session which family reports is due to COPD, RN aware. Patient  consumed thin liquids via straw with Mod A verbal cues needed for small sips. Patient handed of to PT. Continue with current plan of care.      Function:  Eating Eating   Modified Consistency Diet: Yes Eating Assist Level: Helper feeds patient;Supervision or verbal cues           Cognition Comprehension Comprehension assist level: Understands basic less than 25% of the time/ requires cueing >75% of the time  Expression   Expression assist level: Expresses basis less than 25% of the time/requires cueing >75% of the time.  Social Interaction Social Interaction assist level: Interacts appropriately 25 - 49% of time - Needs frequent redirection.  Problem Solving Problem solving assist level: Solves basic less than 25% of the time - needs direction nearly all the time or does not effectively solve problems and may need a restraint for safety  Memory Memory assist level: Recognizes or recalls less than 25% of the time/requires cueing greater than 75% of the time    Pain No/Denies Pain   Therapy/Group: Individual Therapy  Lue Sykora 12/05/2016, 3:32 PM

## 2016-12-05 NOTE — Progress Notes (Signed)
Physical Therapy Session Note  Patient Details  Name: [name] MRN: [id number] Date of Birth: 08-Dec-1926  Today's Date: 12/05/2016 PT Individual Time: 0800-0900 and 1400-1430 PT Individual Time Calculation (min): 60 min and 30 min (total 90 min)   Short Term Goals: Week 1:  PT Short Term Goal 1 (Week 1): Pt will transfer with LRAD and less than 50% cues.  PT Short Term Goal 2 (Week 1): Pt will ambulate 150 with mod assist and LRAD PT Short Term Goal 3 (Week 1): Pt will initiate stair training with PT PT Short Term Goal 4 (Week 1): Pt will attend to task in quiet environment x1 minute with max cues  Skilled Therapeutic Interventions/Progress Updates: Tx 1: Pt received supine in bed, no interpreter present, family member present however unsure of relation. Utilized gestures and visual demonstration throughout session to facilitate activities d/t language barrier. Supine>sit with minA for facilitation. Pants donned maxA for threading LEs d/t poor pt initiation, however pt does extend legs out as therapist begins performing indicating that he does understand goal of task. Pt performs sit <>stand with close S and bedrails, pulls up pants with S and increased time. Stand pivot transfer bed>w/c with min guard and forward flexed posture, reaching for w/c with both arms, reaching for sink as he begins pivoting. Family member assists pt with eating breakfast, does not allow pt to self-feed despite attempts at cueing from therapist. Transfer w/c <>nustep with min guard. Performed BUE/BLE nustep x5 min total before pt attempting to tell therapist something, activity ceased d/t apparent pt dissatisfaction. Performed ascent/descent eight 3-inch steps with B handrails and min guard, increased time. Gait HHA x100' with minA. Returned to room; remained seated in w/c, family present and all needs in reach.   Tx 2: Pt received with handoff from SLP, denies pain and agreeable to treatment. [name]  present to assist with interpreting however she does report he is occasionally difficult to understand. Pt ambulated around cones no AD and HHA with minA x30'. [name] reports he uses a rollator and a cane at home; primarily used rollator. Gait with rollator x100' with min guard; daughter reports his walking looks "slower, worse" but unable to described beyond that. [name] expresses concern that he seems very tired, thinks he needs to go back to the room. Returned to room totalA. Ambulatory transfer to bed with min guard HHA. Pt crawled on knees back into bed; remained in bed with family present and all needs in reach.      Therapy Documentation Precautions:  Precautions Precautions: Fall Restrictions Weight Bearing Restrictions: No General: PT Amount of Missed Time (min): 15 Minutes PT Missed Treatment Reason: Patient fatigue   See Function Navigator for Current Functional Status.   Therapy/Group: Individual Therapy  Vista Lawman 12/05/2016, 9:55 AM

## 2016-12-05 NOTE — Care Management (Signed)
[facility] Individual Statement of Services  Patient Name:  [name]  Date:  12/05/2016  Welcome to the [facility].  Our goal is to provide you with an individualized program based on your diagnosis and situation, designed to meet your specific needs.  With this comprehensive rehabilitation program, you will be expected to participate in at least 3 hours of rehabilitation therapies Monday-Friday, with modified therapy programming on the weekends.  Your rehabilitation program will include the following services:  Physical Therapy (PT), Occupational Therapy (OT), Speech Therapy (ST), 24 hour per day rehabilitation nursing, Therapeutic Recreaction (TR), Case Management (Social Worker), Rehabilitation Medicine, Nutrition Services and Pharmacy Services  Weekly team conferences will be held on Tuesdays to discuss your progress.  Your Social Worker will talk with you frequently to get your input and to update you on team discussions.  Team conferences with you and your family in attendance may also be held.  Expected length of stay: 10-12 days  Overall anticipated outcome: minimal assist  Depending on your progress and recovery, your program may change. Your Social Worker will coordinate services and will keep you informed of any changes. Your Social Worker's name and contact numbers are listed  below.  The following services may also be recommended but are not provided by the [facility]:   Driving Evaluations  Home Health Rehabiltiation Services  Outpatient Rehabilitation Services    Arrangements will be made to provide these services after discharge if needed.  Arrangements include referral to agencies that provide these services.  Your insurance has been verified to be:  [facility] Medicare and Medicaid Your primary doctor is:  Benedetto Goad  Pertinent information will be shared with your doctor and your insurance  company.  Social Worker:  [place], [place] [phone] or [phone]   Information discussed with and copy given to patient by: Amada Jupiter, 12/05/2016, 3:41 PM

## 2016-12-05 NOTE — Progress Notes (Signed)
Social Work  Social Work Assessment and Plan  Patient Details  Name: [name] MRN: [id number] Date of Birth: 07/07/1926  Today's Date: 12/05/2016  Problem List:  Patient Active Problem List   Diagnosis Date Noted  . 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 Cerebral infarction due to embolism of cerebral artery (HCC)   . Chronic atrial fibrillation (HCC)   . History of CVA (cerebrovascular accident)   . Dysphagia, post-stroke   . Chronic diastolic congestive heart failure (HCC)   . Uncomplicated asthma   . History of gout   . Persistent atrial fibrillation (HCC)   . Aphasia   . HCAP (healthcare-associated pneumonia)   . SOB (shortness of breath)   . PAF (paroxysmal atrial fibrillation) (HCC)   . Diabetes mellitus type 2 in nonobese (HCC)   . Stage 3 chronic kidney disease (HCC)   . Tachypnea   . Benign essential HTN   . Hypokalemia   . Acute blood loss anemia   . Cerebral thrombosis with cerebral infarction 11/27/2016  . Stroke (cerebrum) (HCC) 11/27/2016  . Acute hypoxemic respiratory failure (HCC)   . Stenosis of left carotid artery   . Acute ischemic stroke (HCC) 10/18/2016  . Cerebral infarction due to stenosis of right middle cerebral artery (HCC) 10/10/2016  . CVA (cerebral vascular accident) (HCC) 07/18/2016  . Stroke-like symptoms 07/18/2016  . Stroke (HCC) 07/18/2016  . Anemia 07/18/2016  . Non-English speaking patient 07/18/2016  . COPD, group B, by GOLD 2017 classification (HCC)   . Nausea, vomiting and diarrhea 10/03/2015  . CKD (chronic kidney disease), stage III (HCC) 07/27/2015  . Carotid artery disease (HCC) 07/27/2015  . Hyponatremia 06/18/2014  . Paroxysmal atrial fibrillation (HCC) 11/16/2013  . Ischemic cardiomyopathy 11/16/2013  . Headache 11/16/2013  . Chronic anticoagulation 11/16/2013  . Neck pain on left side 11/16/2013  . Chest pain at rest 11/16/2013  . Coronary artery disease 10/14/2013  . Chronic systolic heart  failure (HCC) 78/29/562109/05/2013  . AAA (abdominal aortic aneurysm) without rupture (HCC) 09/08/2013  . Hyperlipidemia 06/08/2013  . History of stroke 05/02/2013  . PVD (peripheral vascular disease) 3.5cm AAA Aug 2014 04/29/2013  . NSTEMI - ? type 2 - Troponin 0.63 04/28/2013  . Chronic bilateral lower abdominal pain 02/17/2013  . Hypertension    Past Medical History:  Past Medical History:  Diagnosis Date  . AAA (abdominal aortic aneurysm) (HCC)   . Anemia   . Asthma   . Carotid artery disease (HCC)   . CHF (congestive heart failure) (HCC)   . Coronary artery disease   . Depression   . Diabetes mellitus type 2 in nonobese (HCC)   . Gout   . H. pylori infection   . Hard of hearing   . Hiatal hernia   . Hyperplasia, prostate   . Hypertension   . PUD (peptic ulcer disease)   . Renal failure, acute (HCC) 11/08/2012   Past Surgical History:  Past Surgical History:  Procedure Laterality Date  . CAROTID STENT INSERTION  2015  . CAROTID STENT INSERTION N/A 05/09/2013   Procedure: CAROTID STENT INSERTION;  Surgeon: Runell GessJonathan J Berry, MD;  [facility];  Service: Cardiovascular;  Laterality: N/A;  . ESOPHAGOGASTRODUODENOSCOPY  06/13/2008,12/31/12  . IR PERCUTANEOUS ART THROMBECTOMY/INFUSION INTRACRANIAL INC DIAG ANGIO  11/27/2016  . LEFT HEART CATHETERIZATION WITH CORONARY ANGIOGRAM N/A 05/04/2013   Procedure: LEFT HEART CATHETERIZATION WITH CORONARY ANGIOGRAM;  Surgeon: Peter M SwazilandJordan, MD;  [facility];  Service:  Cardiovascular;  Laterality: N/A;  . RADIOLOGY WITH ANESTHESIA N/A 11/27/2016   Procedure: IR WITH ANESTHESIA;  Surgeon: Julieanne Cotton, MD;  Location: [facility] OR;  Service: Radiology;  Laterality: N/A;   Social History:  reports that he quit smoking about 14 years ago. He has never used smokeless tobacco. He reports that he does not drink alcohol or use drugs.  Family / Support Systems Marital Status: Married Patient Roles: Spouse, Parent Spouse/Significant Other:  wife does not speak English Children: daughter, Gelene Mink @ (C) [phone] and her spouse, Colvin Caroli @ [phone] Anticipated Caregiver: daughter and wife and family Ability/Limitations of Caregiver: Daughter does not work and can assist.  Family stays with patient most of the time. Caregiver Availability: 24/7 Family Dynamics: Family has been staying with pt here and very involved and supportive.  Social History Preferred language: [place] Religion: Buddhist Cultural Background: Pt is originally from [place] and has been in the [place] ~ 30 yrs Read: Yes (in native language) Write: Yes Employment Status: Retired Fish farm manager Issues: None Guardian/Conservator: None - per MD, pt is not capable of making decisions on his own behalf - defer to daughter/ family   Abuse/Neglect Physical Abuse: Denies Verbal Abuse: Denies Sexual Abuse: Denies Exploitation of patient/patient's resources: Denies Self-Neglect: Denies  Emotional Status Pt's affect, behavior adn adjustment status: Pt lying in bed and keeps eyes closed while I talk with daughter to complete assessment.  He does not appear to be in any distress.  Cannot really gauge his emotional status due to impaired cognition, speech and language barrier. Recent Psychosocial Issues: Multiple prior CVAs Pyschiatric History: None Substance Abuse History: None  Patient / Family Perceptions, Expectations & Goals Pt/Family understanding of illness & functional limitations: [name] has a good, general understanding of her father's stroke and resulting deficits/ need for CIR.  Good awareness of anticipated care he will need at home as well. Premorbid pt/family roles/activities: Family was providing some minima assistance PTA Anticipated changes in roles/activities/participation: little change in roles anticipated as family (mostly wife and daughter) was providing 24/7 support to pt PTA. Pt/family expectations/goals: "I would  like for him to get some strength back."  (daughter)  [employer]: None Premorbid Home Care/DME Agencies: Other (Comment) ([facility]) Transportation available at discharge: yes  Discharge Planning Living Arrangements: Spouse/significant other, Children Support Systems: Spouse/significant other, Children, Other relatives, Church/faith community Type of Residence: Private residence Insurance Resources: Medicare, [employer] (specify county) ([employer]) Surveyor, quantity Resources: Restaurant manager, fast food Screen Referred: No Living Expenses: Lives with family Money Management: Family Does the patient have any problems obtaining your medications?: No Home Management: family Patient/Family Preliminary Plans: pt to return home with his family resuming 24/7 assistance. Social Work Anticipated Follow Up Needs: HH/OP Expected length of stay: 10-12 days  Clinical Impression Elderly, frail - appearing gentleman who is originally from [place] (in [place]. > 30 yrs) here following another CVA.  Family reports this is his 9th CVA.  Daughter and wife at bedside and daughter completes assessment interview as pt does not speak [place] and has severe language deficits due to this CVA.  Family has been providing and will continue to provide 24/7 care for pt but hopeful he might "get some strength back" on CIR.  Will follow for support and d/c planning needs.  Jailah Willis 12/05/2016, 3:38 PM

## 2016-12-05 NOTE — Plan of Care (Signed)
Problem: RH COGNITION-NURSING Goal: RH STG ANTICIPATES NEEDS/CALLS FOR ASSIST W/ASSIST/CUES STG Anticipates Needs/Calls for Assist With mod  Assistance/Cues.  Outcome: Not Progressing Pt cannot call for assistance d/t cognitive limitations. Family educated on how to call for staff assistance but cont to assist pt without staff present   Problem: RH KNOWLEDGE DEFICIT Goal: RH STG INCREASE KNOWLEDGE OF DYSPHAGIA/FLUID INTAKE Outcome: Not Progressing Family brings pt food from outside hospital, not adhering to diet orders

## 2016-12-06 ENCOUNTER — Inpatient Hospital Stay (HOSPITAL_COMMUNITY): Payer: Medicare Other

## 2016-12-06 ENCOUNTER — Inpatient Hospital Stay (HOSPITAL_COMMUNITY): Payer: Medicare Other | Admitting: Physical Therapy

## 2016-12-06 ENCOUNTER — Inpatient Hospital Stay (HOSPITAL_COMMUNITY): Payer: Medicare Other | Admitting: Speech Pathology

## 2016-12-06 DIAGNOSIS — J189 Pneumonia, unspecified organism: Secondary | ICD-10-CM

## 2016-12-06 LAB — BLOOD GAS, ARTERIAL
Acid-Base Excess: 2.9 mmol/L — ABNORMAL HIGH (ref 0.0–2.0)
Bicarbonate: 25.9 mmol/L (ref 20.0–28.0)
FIO2: 21
O2 Saturation: 98 %
PH ART: 7.502 — AB (ref 7.350–7.450)
Patient temperature: 98.6
pCO2 arterial: 33.4 mmHg (ref 32.0–48.0)
pO2, Arterial: 96.4 mmHg (ref 83.0–108.0)

## 2016-12-06 LAB — GLUCOSE, CAPILLARY
GLUCOSE-CAPILLARY: 182 mg/dL — AB (ref 65–99)
GLUCOSE-CAPILLARY: 329 mg/dL — AB (ref 65–99)
Glucose-Capillary: 170 mg/dL — ABNORMAL HIGH (ref 65–99)
Glucose-Capillary: 135 mg/dL — ABNORMAL HIGH (ref 65–99)

## 2016-12-06 LAB — PROTIME-INR
INR: 1.13
Prothrombin Time: 14.4 seconds (ref 11.4–15.2)

## 2016-12-06 MED ORDER — WARFARIN SODIUM 5 MG PO TABS
5.0000 mg | ORAL_TABLET | Freq: Once | ORAL | Status: AC
  Administered 2016-12-06: 5 mg via ORAL
  Filled 2016-12-06: qty 1

## 2016-12-06 MED ORDER — WARFARIN - PHARMACIST DOSING INPATIENT
Freq: Every day | Status: DC
  Administered 2016-12-10: 18:00:00

## 2016-12-06 MED ORDER — FUROSEMIDE 20 MG PO TABS
20.0000 mg | ORAL_TABLET | Freq: Every day | ORAL | Status: DC
  Administered 2016-12-06 – 2016-12-10 (×5): 20 mg via ORAL
  Filled 2016-12-06 (×5): qty 1

## 2016-12-06 NOTE — Progress Notes (Signed)
Speech Language Pathology Daily Session Note  Patient Details  Name: [name] MRN: [id number] Date of Birth: 1926-07-08  Today's Date: 12/06/2016 SLP Individual Time: 0800-0900 SLP Individual Time Calculation (min): 60 min  Short Term Goals: Week 1: SLP Short Term Goal 1 (Week 1): Patient will consume current diet with minimal overt s/s of aspiration and Max A verbal cues for use of swallowing compensatory strategies.  SLP Short Term Goal 2 (Week 1): Patient will demonstrate efficient mastication and complete oral clearance without overt s/s of aspiration with trials of Dys. 2 textures over 2 sessiosn prior to upgrade.  SLP Short Term Goal 3 (Week 1): Patient will imitate vowels in 25% of opportunities with Max A multimodal cues.  SLP Short Term Goal 4 (Week 1): Patient will answer basic yes/no questions via multimodal communication with 25% accuracy with Max A multimodal cues.  SLP Short Term Goal 5 (Week 1): Patient will identify functional objects with 25% accuracy and Max A multimodal cues.  SLP Short Term Goal 6 (Week 1): Patient will demonstrate sustained attention to tasks for 10 minutes with Max A verbal cues for redirection.   Skilled Therapeutic Interventions: Skilled treatment session focused on dysphagia and communication goals. SLP facilitated session by providing skilled observation of pt consuming dysphagia 1 with thin liquids via straw. Pt required Mod A gestural cues for small sips but pt didn't demonstrate any comprehension of cues. Pt with 1 cough during 12 oz consumption via straw. Pt with munch chew pattern and very little lingual manipulation of puree bolus. Complete oral clearing noted. Of note, pt with facial grimacing and intermittent report of pain (to son) pt was inconsistent. Nursing alerted to possibility of pain. Pt with increased spontaneous use of native language with son present. Able to communicate basic wants and needs as well as comprehend simple  information from son. No interpreter present. Pt left upright in bed with bed alarm on and son present. Continue per current plan of care.      Function:  Eating Eating   Modified Consistency Diet: Yes Eating Assist Level: Helper feeds patient;Supervision or verbal cues     Helper Scoops Food on Utensil: Every scoop Helper Brings Food to Mouth: Every scoop   Cognition Comprehension Comprehension assist level: Understands basic less than 25% of the time/ requires cueing >75% of the time  Expression   Expression assist level: Expresses basis less than 25% of the time/requires cueing >75% of the time.  Social Interaction Social Interaction assist level: Interacts appropriately less than 25% of the time. May be withdrawn or combative.  Problem Solving Problem solving assist level: Solves basic less than 25% of the time - needs direction nearly all the time or does not effectively solve problems and may need a restraint for safety  Memory Memory assist level: Recognizes or recalls less than 25% of the time/requires cueing greater than 75% of the time    Pain    Therapy/Group: Individual Therapy  Kane Kusek 12/06/2016, 9:17 AM

## 2016-12-06 NOTE — Significant Event (Signed)
At around 2 am patient's son called for help, when RN went into the patient was moarning and having shallow breathing, upon assessment lungs were clear, O2 sat was 93% Patient son said he denied pain, RT called and she R/O resp issues. RR called, she couldn't get anything from patient or family, so she order chest X-ray start. X-ray result came on call was notified but no new order was placed. Patient became calm and stable. We continue to monitor.

## 2016-12-06 NOTE — Progress Notes (Addendum)
[facility]     PROGRESS NOTE    Subjective/Complaints: Not feeling well. Still a little short of breath. Denies pain.   ROS: limited due to language/communication    Objective: Vital Signs: Blood pressure (!) 143/88, pulse 82, temperature 97.7 F (36.5 C), temperature source Oral, resp. rate (!) 24, height 5\' 5"  (1.651 m), weight 64.3 kg (141 lb 11.2 oz), SpO2 96 %. Dg Chest Port 1 View  Result Date: 12/06/2016 CLINICAL DATA:  Respiratory distress. EXAM: PORTABLE CHEST 1 VIEW COMPARISON:  Chest radiograph December 02, 2016 FINDINGS: Stable cardiomegaly. Calcified aortic knob. LEFT upper lung zone scarring. Decreased pulmonary edema with faint RIGHT lung base airspace opacity. Pulmonary vascular congestion. No pleural effusion. No pneumothorax. Osteopenia. Vascular stent LEFT neck. IMPRESSION: Stable cardiomegaly, decreased pulmonary edema. RIGHT RIGHT lung base atelectasis, less likely early pneumonia. Aortic Atherosclerosis (ICD10-I70.0). Electronically Signed   By: Awilda Metroourtnay  Bloomer M.D.   On: 12/06/2016 03:54    Recent Labs  12/04/16 0659  WBC 9.3  HGB 8.9*  HCT 27.1*  PLT 189    Recent Labs  12/04/16 0659  NA 132*  K 3.1*  CL 96*  GLUCOSE 174*  BUN 21*  CREATININE 1.49*  CALCIUM 8.2*   CBG (last 3)   Recent Labs  12/05/16 2107 12/06/16 0255 12/06/16 0623  GLUCAP 217* 170* 135*    Wt Readings from Last 3 Encounters:  12/03/16 64.3 kg (141 lb 11.2 oz)  11/27/16 63.9 kg (140 lb 14 oz)  11/05/16 64.9 kg (143 lb)    Physical Exam:  Constitutional: He appears well-developed.  83 year old right-handed male Frail  HENT:  Head: Normocephalic and atraumatic.  Eyes: EOM are normal. Right eye exhibits no discharge. Left eye exhibits no discharge.  Neck: Normal range of motion. Neck supple. No thyromegaly present.  Cardiovascular:   IRR IRR Respiratory: ?sl more effort of breathing. Shallow inspiration overall.  No tachypnea.  Crackles at bases  GI: Soft. Bowel sounds are normal. He exhibits no distension.  Musculoskeletal: He exhibits no edema or tenderness.  Neurological: He is alert.  Slow processing .  Moves all 4 limbs during functional activities.   Skin: Skin is warm and dry.  Psychiatric:  Unable to assess due to language, remains quite flat. Does cooperate with examiner  Assessment/Plan: 1. Right hemiparesis and functional deficits secondary to left MCA infarct which require 3+ hours per day of interdisciplinary therapy in a comprehensive inpatient rehab setting. Physiatrist is providing close team supervision and 24 hour management of active medical problems listed below. Physiatrist and rehab team continue to assess barriers to discharge/monitor patient progress toward functional and medical goals.  Function:  Bathing Bathing position Bathing activity did not occur: Refused    Bathing parts      Bathing assist        Upper Body Dressing/Undressing Upper body dressing Upper body dressing/undressing activity did not occur: Refused What is the patient wearing?: Pull over shirt/dress       Pull over shirt/dress - Perfomed by helper: Thread/unthread right sleeve, Thread/unthread left sleeve, Put head through opening, Pull shirt over trunk        Upper body assist Assist Level: Touching or steadying assistance(Pt > 75%)      Lower Body Dressing/Undressing Lower body dressing Lower body dressing/undressing activity did not occur: Refused What is the patient wearing?: Pants, Non-skid slipper socks       Pants- Performed by helper: Thread/unthread right pants leg, Thread/unthread left pants  leg, Pull pants up/down, Fasten/unfasten pants   Non-skid slipper socks- Performed by helper: Don/doff right sock, Don/doff left sock                  Lower body assist Assist for lower body dressing: Touching or steadying assistance (Pt > 75%)      Toileting Toileting Toileting activity did  not occur: No continent bowel/bladder event   Toileting steps completed by helper: Adjust clothing prior to toileting, Performs perineal hygiene, Adjust clothing after toileting Toileting Assistive Devices: Grab bar or rail  Toileting assist Assist level: Two helpers   Transfers Chair/bed transfer   Chair/bed transfer method: Stand pivot Chair/bed transfer assist level: Touching or steadying assistance (Pt > 75%) Chair/bed transfer assistive device: Bedrails, Armrests     Locomotion Ambulation     Max distance: 100 Assist level: Touching or steadying assistance (Pt > 75%)   Wheelchair          Cognition Comprehension Comprehension assist level: Understands basic less than 25% of the time/ requires cueing >75% of the time  Expression Expression assist level: Expresses basis less than 25% of the time/requires cueing >75% of the time.  Social Interaction Social Interaction assist level: Interacts appropriately less than 25% of the time. May be withdrawn or combative.  Problem Solving Problem solving assist level: Solves basic less than 25% of the time - needs direction nearly all the time or does not effectively solve problems and may need a restraint for safety  Memory Memory assist level: Recognizes or recalls less than 25% of the time/requires cueing greater than 75% of the time   Medical Problem List and Plan: 1.  Right sided weakness with aphasia secondary to left MCA infarction embolic secondary to proximal M2 occlusion status post thrombectomy with  revascularization with history of multiple CVAs  -continue therapies  -speaks no English 2.  DVT Prophylaxis/Anticoagulation: Requested pharmacy assistance with beginning coumadin:  goal INR of 2.5-3.5 3. Pain Management: Tylenol as needed 4. Mood: Provide emotional support 5. Neuropsych: This patient is capable of making decisions on his own behalf. 6. Skin/Wound Care: Routine skin checks 7. Fluids/Electrolytes/Nutrition:  encourage PO  -replacing potassium 8. Dysphagia. Dysphagia #1 thin liquid. Follow-up speech therapy 9. Pneumonia. Zosyn/vancomycin  as directed.   -Complete course of prednisone with slow taper  -CXR personally reviewed and without new disease  -oxygen sats stable. But ?more work of breathing   10. Atrial fibrillation. Patient on Xarelto prior to admission. Discuss when to resume anticoagulation of Coumadin to begin the weekend of 12/06/2016. Cardiac rate controlled 11. CKD stage III. Follow-up chemistries serially 12. Diastolic congestive heart failure. Check weight today  -give dose of lasix this morning  -check abg  -check weight today and daily  -supplemental oxygen if needed. ?behavioral component to dyspnea 13. Hypertension. Coreg 6.25 mg twice a day 14. Diabetes mellitus and peripheral neuropathy. Hemoglobin A1c 6.0.SSI 15. MRSA PCR screen positive. Contact precautions 16. Hyperlipidemia. Lipitor 17. Asthma. Continue nebulizers as directed 18. Gout. Zyloprim 300 mg daily 19. ?Dysuria:   observe for patterns, urine with mild odor    -ucx negative  LOS (Days) 3 A FACE TO FACE EVALUATION WAS PERFORMED  Ranelle Oyster, MD 12/06/2016 8:48 AM

## 2016-12-06 NOTE — Discharge Summary (Signed)
[facility] Discharge Summary   Patient: [name] ZOX:[id number]   PCP: [name], MD DOB: February 07, 1927   Date of admission: 11/26/2016   Date of discharge: 12/03/2016     Discharge Diagnoses:  Active Problems:   Cerebral thrombosis with cerebral infarction   Stroke (cerebrum) (HCC)   Acute hypoxemic respiratory failure (HCC)   Aphasia   HCAP (healthcare-associated pneumonia)   SOB (shortness of breath)   PAF (paroxysmal atrial fibrillation) (HCC)   Diabetes mellitus type 2 in nonobese (HCC)   Stage 3 chronic kidney disease (HCC)   Tachypnea   Benign essential HTN   Hypokalemia   Acute blood loss anemia   Persistent atrial fibrillation (HCC)   Cerebral infarction due to embolism of cerebral artery (HCC)   Chronic atrial fibrillation (HCC)   History of CVA (cerebrovascular accident)   Dysphagia, post-stroke   Chronic diastolic congestive heart failure (HCC)   Uncomplicated asthma   History of gout   Admitted From: home Disposition:  CIR  Recommendations for Outpatient Follow-up:  1. Follow-up with PCP as well as neurology as recommended. 2. Please continue 81 mg aspirin for now, start taking Coumadin without any bridging starting this weekend with INR goal of 2.5-3.5.  Once the INR is more than 2.5 stop taking aspirin.  Follow-up Information    [name], MD. Schedule an appointment as soon as possible for a visit in 1 week(s).   Specialty:  Family Medicine Contact information: [address] [place] [address] [phone]        Micki Riley, MD. Schedule an appointment as soon as possible for a visit in 1 month(s).   Specialties:  Neurology, Radiology Contact information: [address] [place] [address] [phone]          Diet recommendation: Dysphagia 1 diet thin liquids  Activity: The patient is advised to gradually reintroduce usual activities.  Discharge Condition: good  Code Status: Full  code  History of present illness: As per the H and P dictated on admission, "Pt is encephelopathic; therefore, this HPI is obtained from chart review. [name] is a 83 y.o. male with PMH as outlined below including multiple strokes in the past, A.fib on xarelto.  He presented to [facility] ED 10/17 with acute aphasia and right sided weakness.  Had similar symptoms with acute CVA 1 month prior.  In ED, CT demonstrated dense left M2 segment.  He was taken to IR where he had left common carotid arteriogram followed by complete revascularization of occluded left MCA.  Per IR notes, had small hemorrhagic conversion in left caudate and left parietal occipital.  He returned to the ICU on the ventilator and PCCM was asked to see for vent management."  Hospital Course:  Summary of his active problems in the hospital is as following. NEUROLOGIC Acute left MCA infarct - s/p IR revascularization. Per IR notes, small hemorrhagic conversion post procedure. Hx multiple strokes. - Stroke workup per neuro. MRI shows acute left lateral frontal lobe infarct with petechial hemorrhage.  CTA left M2 occlusion. S/P left M2 revascularization. Patient was on Xarelto which will be discontinued on discharge. Patient will be started on Coumadin over the weekend with INR goal of 2.5-3.5. Per neurology consider aspirin until Coumadin is therapeutic. - Continue PT/OT, Speech, and BP management. -Speech therapy recommends dysphagia 1 diet.  PULMONARY Acute Hypoxic respiratory failure Aspiration Pneumonia Hx asthma/ emphysema on CT  ?AECOPD  PLAN -  Continue abx for  2 more days. Supplemental O2 as needed to keep sats >90%   CARDIOVASCULAR  Hx a.fib (on xarelto) HTN CAD CHF ascending aortic aneurysm Afib with RVR - improved.  Off cardizem gtt.  PLAN -  Continue to hold anticoagulation - Defer timing of resuming anticoagulation to neuro. PRN lopressor  Resume home coreg  Continue preadmission  atorvastatin   RENAL Hyponatremia - likely chronic CKD - baseline Scr 1.3-1.7 Hypokalemia  PLAN -  Stable now.  GASTROINTESTINAL No active issues   HEMATOLOGIC Anemia: PLAN -   Hgb 9.5, which is at baseline of Hgb 9-10  Continue to monitor SCD's   INFECTIOUS Sepsis due to Aspiration pneumonia - resolved.  PLAN -  Follow cultures  abx as above    ENDOCRINE No active issues   All other chronic medical condition were stable during the hospitalization.  Patient was seen by physical therapy, who recommended CIR, which was arranged by Child psychotherapist and case Production designer, theatre/television/film. On the day of the discharge the patient's vitals were stable, and no other acute medical condition were reported by patient. the patient was felt safe to be discharge at [facility] with therapy.  Procedures and Results:  Echocardiogram    Consultations:  Neurology   PCCM   DISCHARGE MEDICATION: Discharge Medication List as of 12/03/2016  2:39 PM    START taking these medications   Details  piperacillin-tazobactam (ZOSYN) 3.375 GM/50ML IVPB Inject 50 mLs (3.375 g total) into the vein every 8 (eight) hours., Starting Wed 12/03/2016, Until Sat 12/06/2016, No Print    predniSONE (DELTASONE) 10 MG tablet Take 40mg  daily for 3days,Take 30mg  daily for 3days,Take 20mg  daily for 3days,Take 10mg  daily for 3days, then stop, Normal    senna-docusate (SENOKOT-S) 8.6-50 MG tablet Take 1 tablet by mouth at bedtime as needed for mild constipation., Starting Wed 12/03/2016, Normal    vancomycin (VANCOCIN) 1-5 GM/200ML-% SOLN Inject 200 mLs (1,000 mg total) into the vein daily., Starting Thu 12/04/2016, Until Fri 12/05/2016, No Print      CONTINUE these medications which have CHANGED   Details  aspirin EC 81 MG tablet Take 1 tablet (81 mg total) by mouth daily., Starting Wed 12/03/2016, No Print      CONTINUE these medications which have NOT CHANGED   Details  allopurinol (ZYLOPRIM) 300 MG tablet Take 300 mg by  mouth daily. To prevent gout, Historical Med    arformoterol (BROVANA) 15 MCG/2ML NEBU Take 2 mLs (15 mcg total) by nebulization 2 (two) times daily., Starting Wed 10/10/2015, Print    atorvastatin (LIPITOR) 40 MG tablet Take 1 tablet (40 mg total) by mouth daily at 6 PM., Starting Wed 05/11/2013, Normal    budesonide (PULMICORT) 0.5 MG/2ML nebulizer solution Take 2 mLs (0.5 mg total) by nebulization 2 (two) times daily., Starting Wed 10/10/2015, Normal    carvedilol (COREG) 6.25 MG tablet Take 1 tablet (6.25 mg total) by mouth 2 (two) times daily with a meal., Starting Wed 07/23/2016, Normal    esomeprazole (NEXIUM) 40 MG capsule Take 1 capsule (40 mg total) by mouth 2 (two) times daily before a meal., Starting Mon 07/21/2016, No Print    HYDROcodone-homatropine (HYCODAN) 5-1.5 MG/5ML syrup Take 5 mLs by mouth every 6 (six) hours as needed for cough., Historical Med    ipratropium-albuterol (DUONEB) 0.5-2.5 (3) MG/3ML SOLN Take 3 mLs by nebulization 2 (two) times daily as needed (for shortness of breath or wheezing)., Historical Med    NITROSTAT 0.4 MG SL tablet PLACE 1 TABLET UNDER THE  TONGUE EVERY 5 MINUTES FOR 3 DOSES AS NEEDED FOR CHEST PAIN, Normal    nystatin (MYCOSTATIN) 100000 UNIT/ML suspension Take 3 mLs by mouth 4 (four) times daily., Starting Mon 11/10/2016, Historical Med    tamsulosin (FLOMAX) 0.4 MG CAPS capsule Take 0.4 mg by mouth daily. To improve bladder function, Historical Med      STOP taking these medications     dabigatran (PRADAXA) 75 MG CAPS capsule      isosorbide mononitrate (IMDUR) 30 MG 24 hr tablet      XARELTO 15 MG TABS tablet        Allergies  Allergen Reactions  . Levofloxacin Anaphylaxis and Other (See Comments)    Patient told his daughter that it made him "feel worse than he already did" (overall) Headache also (has refused to take anymore)  . Eliquis [Apixaban] Itching and Swelling  . Phenazopyridine Hcl Other (See Comments)    Unknown reaction   . Septra [Sulfamethoxazole-Trimethoprim] Nausea And Vomiting and Other (See Comments)    GI Upset  . Latex Rash  . Tape Rash   Discharge Instructions    Ambulatory referral to Anticoagulation Monitoring    Complete by:  As directed    Start on Saturday and no need to bridge. Stop aspirin once INR between 2.5-3.5   INR Goal:  2.5-3.5   Responsible Group:  [facility]   DIET - DYS 1    Complete by:  As directed    Fluid consistency:  Thin   Discharge instructions    Complete by:  As directed    It is important that you read following instructions as well as go over your medication list with RN to help you understand your care after this hospitalization.  Discharge Instructions: Please follow-up with PCP in one week  Please request your primary care physician to go over all Hospital Tests and Procedure/Radiological results at the follow up,  Please get all Hospital records sent to your PCP by signing hospital release before you go home.   Do not drive, operating heavy machinery, perform activities at heights, swimming or participation in water activities or provide baby sitting services; until you have been seen by Primary Care Physician or a Neurologist and advised to do so again. Do not take more than prescribed Pain, Sleep and Anxiety Medications. You were cared for by a hospitalist during your hospital stay. If you have any questions about your discharge medications or the care you received while you were in the hospital after you are discharged, you can call the unit and ask to speak with the hospitalist on call if the hospitalist that took care of you is not available.  Once you are discharged, your primary care physician will handle any further medical issues. Please note that NO REFILLS for any discharge medications will be authorized once you are discharged, as it is imperative that you return to your primary care physician (or establish a relationship with  a primary care physician if you do not have one) for your aftercare needs so that they can reassess your need for medications and monitor your lab values. You Must read complete instructions/literature along with all the possible adverse reactions/side effects for all the Medicines you take and that have been prescribed to you. Take any new Medicines after you have completely understood and accept all the possible adverse reactions/side effects. Wear Seat belts while driving. If you have smoked or chewed Tobacco in the last 2  yrs please stop smoking and/or stop any Recreational drug use.   Increase activity slowly    Complete by:  As directed      Discharge Exam: Filed Weights   11/26/16 2136 11/27/16 0500  Weight: 68 kg (150 lb) 63.9 kg (140 lb 14 oz)   Vitals:   12/03/16 1400 12/03/16 1500  BP: (!) 156/83 128/63  Pulse: (!) 40 75  Resp: 15 16  Temp:    SpO2: 99% 99%   General: Appear in mild distress, no Rash; Oral Mucosa moist. Cardiovascular: S1 and S2 Present, no Murmur, no JVD Respiratory: Bilateral Air entry present and Clear to Auscultation, no Crackles, no wheezes Abdomen: Bowel Sound present, Soft and no tenderness Extremities: no Pedal edema, no calf tenderness Neurology: Left gaze preference, right neglect, right lower extremity 4 out of 5.  Negative Babinski.  The results of significant diagnostics from this hospitalization (including imaging, microbiology, ancillary and laboratory) are listed below for reference.    Significant Diagnostic Studies: Ct Angio Head W Or Wo Contrast  Result Date: 11/27/2016 CLINICAL DATA:  83 y/o  M; code stroke. EXAM: CT ANGIOGRAPHY HEAD AND NECK CT PERFUSION BRAIN TECHNIQUE: Multidetector CT imaging of the head and neck was performed using the standard protocol during bolus administration of intravenous contrast. Multiplanar CT image reconstructions and MIPs were obtained to evaluate the vascular anatomy. Carotid stenosis measurements  (when applicable) are obtained utilizing NASCET criteria, using the distal internal carotid diameter as the denominator. Multiphase CT imaging of the brain was performed following IV bolus contrast injection. Subsequent parametric perfusion maps were calculated using RAPID software. CONTRAST:  90 cc Isovue 370 COMPARISON:  None. FINDINGS: CTA NECK FINDINGS Aortic arch: Ascending aortic aneurysm measuring up to 4.2 cm. Moderate to severe mixed plaque of the aortic arch with multiple plaque ulcerations. Great vessel origins are patent without high-grade stenosis. Three-vessel arch. Right carotid system: No evidence of dissection, stenosis (50% or greater) or occlusion. Moderate mixed plaque of the carotid bifurcation without significant stenosis. Left carotid system: No evidence of dissection, stenosis (50% or greater) or occlusion. Stent extending from the distal left common carotid artery to the proximal left internal carotid artery. The stent is widely patent. Severe stenosis of left external artery origin. Vertebral arteries: Left dominant. No evidence of dissection, stenosis (50% or greater) or occlusion. Hairpin loops of V1 segments bilaterally. Skeleton: Moderate cervical spondylosis with multilevel disc and facet degenerative changes. No high-grade bony canal stenosis. Other neck: Negative. Upper chest: Peripheral reticulation of the lung parenchyma compatible fibrosis. Enlarged mediastinal lymph nodes including a left lower paratracheal lymph node measuring 12 mm short axis, probably reactive. Review of the MIP images confirms the above findings CTA HEAD FINDINGS Anterior circulation: Left M2 inferior division proximal occlusion with poor collateral circulation in the left posterior MCA distribution. Left posterior communicating artery ICA origin is rounded and measures 4 mm in diameter compatible with aneurysmal dilatation (series 10, image 106). Otherwise no significant stenosis, occlusion, or aneurysm of  the anterior circulation. Posterior circulation: No significant stenosis, occlusion, or vascular malformation. Left V4 segment focal ectasia measuring 6 mm with ulcerated mural plaque (series 8, image 156). Venous sinuses: As permitted by contrast timing, patent. Anatomic variants: Left fetal PCA. Patent small right posterior communicating artery and anterior communicating artery. Delayed phase: Right mastoid opacification and sclerosis compatible sequelae of chronic otomastoiditis. Review of the MIP images confirms the above findings CT Brain Perfusion Findings: CBF (<30%) Volume: 48mL Perfusion (Tmax>6.0s) volume: Mismatch  Volume: 81mL Infarction Location:Left posterior MCA IMPRESSION: 1. Left middle cerebral artery proximal M2 inferior division occlusion. Poor collateral circulation in the left posterior MCA distribution. 2. CT perfusion demonstrating left posterior MCA distribution core infarct with volume of 48 cc and ischemic penumbra of 81 cc by automated RAPID post processing. 3. Patent carotid and vertebral arteries of the neck. No high-grade stenosis by NASCET criteria, aneurysm, or dissection. 4. Stented left distal common carotid artery to proximal left internal carotid artery. The stent appears widely patent. 5. 4 mm rounded dilation of left posterior communicating artery ICA origin compatible with aneurysm. 6. Left V4 segment focal ectasia measuring 6 mm with ulcerated mural plaque. 7. 4.2 cm ascending aortic aneurysm. Recommend annual imaging followup by CTA or MRA. This recommendation follows 2010 ACCF/AHA/AATS/ACR/ASA/SCA/SCAI/SIR/STS/SVM Guidelines for the Diagnosis and Management of Patients with Thoracic Aortic Disease. Circulation. 2010; 121: Z610-R604. 8. Moderate to severe mixed plaque of the aorta with multiple plaque ulcerations. These results were called by telephone at the time of interpretation on 11/27/2016 at 1:29am to [facility], who verbally acknowledged these results.  Electronically Signed   By: Mitzi Hansen M.D.   On: 11/27/2016 01:34   Ct Head Wo Contrast  Result Date: 11/27/2016 CLINICAL DATA:  Neuro change since interventional procedure. Nonresponsive. EXAM: CT HEAD WITHOUT CONTRAST TECHNIQUE: Contiguous axial images were obtained from the base of the skull through the vertex without intravenous contrast. COMPARISON:  Head CT from earlier the same day FINDINGS: Brain: On admission head CT from yesterday there is petechial hemorrhage within the left caudate and left parietooccipital cortex correlating with subacute infarcts, see MRI 10/18/2016. There is also postprocedural high density in the left temporoparietal cortex and caudate head, likely contrast staining. There is extravasated contrast or subarachnoid hemorrhage in the lower left sylvian fissure and perimesencephalic cephalic cistern that is increased from prior. Lateral left frontal cortically based infarct that is better defined. Small focus of enhancement in the right thalamus, no calcification in this area on head CT from yesterday, presumably subacute infarct. Moderate remote right frontal infarct. Vascular: Symmetric vascular density. Skull: Remote right zygomatic arch fracture. Sinuses/Orbits: Right mastoid and middle ear opacification without erosion. Bilateral cataract resection IMPRESSION: 1. Increased subarachnoid contrast/hemorrhage in the lower left sylvian fissure and perimesencephalic cistern, overall small volume. 2. Contrast staining in the infarcted left temporoparietal cortex after recent procedure. 3. Petechial hemorrhage in subacute infarcts in the left caudate head and left parietooccipital cortex as seen on admission noncontrast head CT. 4. Moderate acute lateral left frontal infarct. Electronically Signed   By: Marnee Spring M.D.   On: 11/27/2016 07:24   Ct Head Wo Contrast  Result Date: 11/27/2016 CLINICAL DATA:  83 y/o M; stroke follow-up post intra-arterial  intervention. EXAM: CT HEAD WITHOUT CONTRAST TECHNIQUE: Contiguous axial images were obtained from the base of the skull through the vertex without intravenous contrast. COMPARISON:  11/27/2016 CT of the head and CT perfusion. 10/18/2016 MRI of the head. FINDINGS: Brain: Enhancement of the left caudate head, scattered small foci of the left frontal/ parietal convexity, and left lateral temporal lobe infarction correspond to infarct core as demonstrated by CBF<30% on prior CT perfusion of the head. No new region of infarction is identified at this time. There is sulcal effacement and mild local mass effect associated with the areas of acute infarction. No midline shift or herniation. The area of hemorrhagic infarction in the left temporoparietal lobe that was present on the 10/18/2016 MRI of the head demonstrates increased  attenuation which probably represents enhancing subacute infarction. In the presence of contrast evaluation for new hemorrhage is limited. Multiple stable chronic infarctions are present in bilateral frontal lobes, left parietal lobe, and the basal ganglia. Stable chronic microvascular ischemic changes and parenchymal volume loss of the brain. Vascular: Contrast is present within the intracranial circulation. The left MCA M2 inferior division appears persistently patent. Skull: Normal. Negative for fracture or focal lesion. Sinuses/Orbits: Mild ethmoid sinus mucosal thickening. Right mastoid opacification with sclerosis compatible with chronic otomastoiditis. Soft tissue thickening of the right external auditory canal, correlate for possible otitis externa. No bony destructive changes. Bilateral intra-ocular lens replacement. Other: None. IMPRESSION: 1. Enhancement of left caudate head, scattered foci of the left frontal parietal lobes, and left lateral temporal lobe areas of acute infarction correspond to infarct core on the prior CT perfusion. No new area of acute infarction identified at this  time. No significant mass effect. 2. Increased density within the left temporoparietal lobe hemorrhagic infarction probably represents superimposed enhancement. Evaluation for new hemorrhage is limited in the presence of contrast. 3. Soft tissue thickening of right external auditory canal, correlate for possible otitis externa. No bony destructive changes. Electronically Signed   By: Mitzi Hansen M.D.   On: 11/27/2016 05:08   Ct Head Wo Contrast  Result Date: 11/26/2016 CLINICAL DATA:  83 y/o M; inability to talk. Week with right-sided numbness. EXAM: CT HEAD WITHOUT CONTRAST TECHNIQUE: Contiguous axial images were obtained from the base of the skull through the vertex without intravenous contrast. COMPARISON:  10/21/2016 CT head and 10/18/2016 MRI of the head. FINDINGS: Brain: Hemorrhagic conversion of left parietooccipital infarction without significant mass effect or extra-axial collection. Stable small areas of now chronic infarction in right temporal periventricular white matter, left thalamus, and scattered through left frontal and parietal cortices. Stable bilateral frontal and left parietal chronic infarctions from prior study. Stable chronic microvascular ischemic changes and parenchymal volume loss of the brain. Subcentimeter focus of increased density within the left caudate head with associated hypoattenuation new from prior study compatible with a small hemorrhagic infarction (series 7, image 17). New small focus of hypoattenuation involving left lateral parietal cortex compatible with interval acute/ early subacute infarction (series 7, image 23). Vascular: Calcific atherosclerosis of carotid siphons. Left MCA hyperdensity in left lateral MCA cistern (series 7, image 11). Skull: Normal. Negative for fracture or focal lesion. Sinuses/Orbits: No acute finding. Other: None. IMPRESSION: 1. New foci of hypoattenuation within the left parietal cortex and left caudate head compatible with  interval late acute/early subacute infarction from 10/21/2016. Subcentimeter focus of hemorrhage within the new left caudate head infarction. No significant mass effect. 2. Hemorrhagic conversion of left parietooccipital infarction. No significant mass effect or extra-axial collection. 3. Multiple chronic infarcts as described are stable. 4. Hyperdense distal left MCA in the MCA cistern may represent thrombus. These results were called by telephone at the time of interpretation on 11/26/2016 at 11:50 pm to Dr. Rolland Porter , who verbally acknowledged these results. Electronically Signed   By: Mitzi Hansen M.D.   On: 11/26/2016 23:54   Ct Angio Neck W Or Wo Contrast  Result Date: 11/27/2016 CLINICAL DATA:  83 y/o  M; code stroke. EXAM: CT ANGIOGRAPHY HEAD AND NECK CT PERFUSION BRAIN TECHNIQUE: Multidetector CT imaging of the head and neck was performed using the standard protocol during bolus administration of intravenous contrast. Multiplanar CT image reconstructions and MIPs were obtained to evaluate the vascular anatomy. Carotid stenosis measurements (when applicable) are obtained  utilizing NASCET criteria, using the distal internal carotid diameter as the denominator. Multiphase CT imaging of the brain was performed following IV bolus contrast injection. Subsequent parametric perfusion maps were calculated using RAPID software. CONTRAST:  90 cc Isovue 370 COMPARISON:  None. FINDINGS: CTA NECK FINDINGS Aortic arch: Ascending aortic aneurysm measuring up to 4.2 cm. Moderate to severe mixed plaque of the aortic arch with multiple plaque ulcerations. Great vessel origins are patent without high-grade stenosis. Three-vessel arch. Right carotid system: No evidence of dissection, stenosis (50% or greater) or occlusion. Moderate mixed plaque of the carotid bifurcation without significant stenosis. Left carotid system: No evidence of dissection, stenosis (50% or greater) or occlusion. Stent extending from  the distal left common carotid artery to the proximal left internal carotid artery. The stent is widely patent. Severe stenosis of left external artery origin. Vertebral arteries: Left dominant. No evidence of dissection, stenosis (50% or greater) or occlusion. Hairpin loops of V1 segments bilaterally. Skeleton: Moderate cervical spondylosis with multilevel disc and facet degenerative changes. No high-grade bony canal stenosis. Other neck: Negative. Upper chest: Peripheral reticulation of the lung parenchyma compatible fibrosis. Enlarged mediastinal lymph nodes including a left lower paratracheal lymph node measuring 12 mm short axis, probably reactive. Review of the MIP images confirms the above findings CTA HEAD FINDINGS Anterior circulation: Left M2 inferior division proximal occlusion with poor collateral circulation in the left posterior MCA distribution. Left posterior communicating artery ICA origin is rounded and measures 4 mm in diameter compatible with aneurysmal dilatation (series 10, image 106). Otherwise no significant stenosis, occlusion, or aneurysm of the anterior circulation. Posterior circulation: No significant stenosis, occlusion, or vascular malformation. Left V4 segment focal ectasia measuring 6 mm with ulcerated mural plaque (series 8, image 156). Venous sinuses: As permitted by contrast timing, patent. Anatomic variants: Left fetal PCA. Patent small right posterior communicating artery and anterior communicating artery. Delayed phase: Right mastoid opacification and sclerosis compatible sequelae of chronic otomastoiditis. Review of the MIP images confirms the above findings CT Brain Perfusion Findings: CBF (<30%) Volume: 48mL Perfusion (Tmax>6.0s) volume: Mismatch Volume: 81mL Infarction Location:Left posterior MCA IMPRESSION: 1. Left middle cerebral artery proximal M2 inferior division occlusion. Poor collateral circulation in the left posterior MCA distribution. 2. CT perfusion  demonstrating left posterior MCA distribution core infarct with volume of 48 cc and ischemic penumbra of 81 cc by automated RAPID post processing. 3. Patent carotid and vertebral arteries of the neck. No high-grade stenosis by NASCET criteria, aneurysm, or dissection. 4. Stented left distal common carotid artery to proximal left internal carotid artery. The stent appears widely patent. 5. 4 mm rounded dilation of left posterior communicating artery ICA origin compatible with aneurysm. 6. Left V4 segment focal ectasia measuring 6 mm with ulcerated mural plaque. 7. 4.2 cm ascending aortic aneurysm. Recommend annual imaging followup by CTA or MRA. This recommendation follows 2010 ACCF/AHA/AATS/ACR/ASA/SCA/SCAI/SIR/STS/SVM Guidelines for the Diagnosis and Management of Patients with Thoracic Aortic Disease. Circulation. 2010; 121: Z610-R604. 8. Moderate to severe mixed plaque of the aorta with multiple plaque ulcerations. These results were called by telephone at the time of interpretation on 11/27/2016 at 1:29am to PA [facility], who verbally acknowledged these results. Electronically Signed   By: Mitzi Hansen M.D.   On: 11/27/2016 01:34   Mr Brain Wo Contrast  Result Date: 11/27/2016 CLINICAL DATA:  Aphasia and right-sided weakness. History of strokes and atrial fibrillation. EXAM: MRI HEAD WITHOUT CONTRAST TECHNIQUE: Multiplanar, multiecho pulse sequences of the brain and surrounding structures were obtained  without intravenous contrast. COMPARISON:  Head CT 11/27/2016 and MRI 10/18/2016 FINDINGS: Brain: There is a small to moderate-sized acute left MCA infarct involving the frontal operculum and middle frontal gyrus. There is a large posterior/inferior left MCA infarct involving much of the temporal lobe and some of the parietal lobe which is new from the prior MRI and with associated petechial hemorrhage again noted. A subacute left parieto-occipital infarct with petechial hemorrhage is also again  noted. A subacute left caudate infarct is new from the prior MRI with associated petechial hemorrhage. There are punctate acute or early subacute infarcts in the left lentiform nucleus. Subacute to chronic infarcts are present in both thalami and in the posterior limb of the right internal capsule. FLAIR hyperintensity in the left perimesencephalic cistern is consistent with small volume subarachnoid hemorrhage as seen on today's head CT. Chronic microhemorrhages are present in the right thalamus. Chronic cortical infarcts are present in both frontal lobes and in the left parietal lobe. There is a chronic white matter infarct in the left centrum semiovale. There is moderate cerebral atrophy with unchanged asymmetric CSF over the left frontal convexity which is chronic and may be secondary to atrophy versus small subdural hygroma. No mass or midline shift. Vascular: Major intracranial vascular flow voids are preserved. Skull and upper cervical spine: Unremarkable bone marrow signal. Sinuses/Orbits: Bilateral cataract extraction. Clear paranasal sinuses. Right mastoid and middle ear fluid. Other: None. IMPRESSION: 1. Acute left lateral frontal lobe infarct. 2. Extensive subacute and chronic ischemia as above, including a large subacute left temporal lobe infarct which is new from 10/18/2016. Associated petechial hemorrhage. 3. Small volume subarachnoid hemorrhage in the left perimesencephalic cistern. Electronically Signed   By: Sebastian Ache M.D.   On: 11/27/2016 10:30   Ct Cerebral Perfusion W Contrast  Result Date: 11/27/2016 CLINICAL DATA:  83 y/o  M; code stroke. EXAM: CT ANGIOGRAPHY HEAD AND NECK CT PERFUSION BRAIN TECHNIQUE: Multidetector CT imaging of the head and neck was performed using the standard protocol during bolus administration of intravenous contrast. Multiplanar CT image reconstructions and MIPs were obtained to evaluate the vascular anatomy. Carotid stenosis measurements (when applicable) are  obtained utilizing NASCET criteria, using the distal internal carotid diameter as the denominator. Multiphase CT imaging of the brain was performed following IV bolus contrast injection. Subsequent parametric perfusion maps were calculated using RAPID software. CONTRAST:  90 cc Isovue 370 COMPARISON:  None. FINDINGS: CTA NECK FINDINGS Aortic arch: Ascending aortic aneurysm measuring up to 4.2 cm. Moderate to severe mixed plaque of the aortic arch with multiple plaque ulcerations. Great vessel origins are patent without high-grade stenosis. Three-vessel arch. Right carotid system: No evidence of dissection, stenosis (50% or greater) or occlusion. Moderate mixed plaque of the carotid bifurcation without significant stenosis. Left carotid system: No evidence of dissection, stenosis (50% or greater) or occlusion. Stent extending from the distal left common carotid artery to the proximal left internal carotid artery. The stent is widely patent. Severe stenosis of left external artery origin. Vertebral arteries: Left dominant. No evidence of dissection, stenosis (50% or greater) or occlusion. Hairpin loops of V1 segments bilaterally. Skeleton: Moderate cervical spondylosis with multilevel disc and facet degenerative changes. No high-grade bony canal stenosis. Other neck: Negative. Upper chest: Peripheral reticulation of the lung parenchyma compatible fibrosis. Enlarged mediastinal lymph nodes including a left lower paratracheal lymph node measuring 12 mm short axis, probably reactive. Review of the MIP images confirms the above findings CTA HEAD FINDINGS Anterior circulation: Left M2 inferior division  proximal occlusion with poor collateral circulation in the left posterior MCA distribution. Left posterior communicating artery ICA origin is rounded and measures 4 mm in diameter compatible with aneurysmal dilatation (series 10, image 106). Otherwise no significant stenosis, occlusion, or aneurysm of the anterior  circulation. Posterior circulation: No significant stenosis, occlusion, or vascular malformation. Left V4 segment focal ectasia measuring 6 mm with ulcerated mural plaque (series 8, image 156). Venous sinuses: As permitted by contrast timing, patent. Anatomic variants: Left fetal PCA. Patent small right posterior communicating artery and anterior communicating artery. Delayed phase: Right mastoid opacification and sclerosis compatible sequelae of chronic otomastoiditis. Review of the MIP images confirms the above findings CT Brain Perfusion Findings: CBF (<30%) Volume: 48mL Perfusion (Tmax>6.0s) volume: Mismatch Volume: 81mL Infarction Location:Left posterior MCA IMPRESSION: 1. Left middle cerebral artery proximal M2 inferior division occlusion. Poor collateral circulation in the left posterior MCA distribution. 2. CT perfusion demonstrating left posterior MCA distribution core infarct with volume of 48 cc and ischemic penumbra of 81 cc by automated RAPID post processing. 3. Patent carotid and vertebral arteries of the neck. No high-grade stenosis by NASCET criteria, aneurysm, or dissection. 4. Stented left distal common carotid artery to proximal left internal carotid artery. The stent appears widely patent. 5. 4 mm rounded dilation of left posterior communicating artery ICA origin compatible with aneurysm. 6. Left V4 segment focal ectasia measuring 6 mm with ulcerated mural plaque. 7. 4.2 cm ascending aortic aneurysm. Recommend annual imaging followup by CTA or MRA. This recommendation follows 2010 ACCF/AHA/AATS/ACR/ASA/SCA/SCAI/SIR/STS/SVM Guidelines for the Diagnosis and Management of Patients with Thoracic Aortic Disease. Circulation. 2010; 121: O962-X528. 8. Moderate to severe mixed plaque of the aorta with multiple plaque ulcerations. These results were called by telephone at the time of interpretation on 11/27/2016 at 1:29am to PA [facility], who verbally acknowledged these results. Electronically Signed    By: Mitzi Hansen M.D.   On: 11/27/2016 01:34   Dg Chest Port 1 View  Result Date: 12/06/2016 CLINICAL DATA:  Respiratory distress. EXAM: PORTABLE CHEST 1 VIEW COMPARISON:  Chest radiograph December 02, 2016 FINDINGS: Stable cardiomegaly. Calcified aortic knob. LEFT upper lung zone scarring. Decreased pulmonary edema with faint RIGHT lung base airspace opacity. Pulmonary vascular congestion. No pleural effusion. No pneumothorax. Osteopenia. Vascular stent LEFT neck. IMPRESSION: Stable cardiomegaly, decreased pulmonary edema. RIGHT RIGHT lung base atelectasis, less likely early pneumonia. Aortic Atherosclerosis (ICD10-I70.0). Electronically Signed   By: Awilda Metro M.D.   On: 12/06/2016 03:54   Dg Chest Portable 1 View  Result Date: 12/02/2016 CLINICAL DATA:  Healthcare associated pneumonia. EXAM: PORTABLE CHEST 1 VIEW COMPARISON:  11/30/2016 . FINDINGS: Cardiomegaly with pulmonary vascular prominence and bilateral interstitial prominence. Findings suggest CHF. Basilar atelectasis. Left hemidiaphragm incompletely imaged. No prominent pleural effusion noted. No pneumothorax IMPRESSION: 1. Cardiomegaly with bilateral from interstitial prominence suggesting CHF. 2. Low lung volumes with bibasilar atelectasis and infiltrates/edema. No interim change . Electronically Signed   By: Maisie Fus  Register   On: 12/02/2016 07:23   Dg Chest Port 1 View  Result Date: 11/30/2016 CLINICAL DATA:  Shortness of breath EXAM: PORTABLE CHEST 1 VIEW COMPARISON:  11/28/2016 FINDINGS: Persistent right lower lobe airspace disease. Trace bilateral pleural effusions. No pneumothorax. Stable cardiomegaly. Thoracic aortic atherosclerosis. Interval extubation. No acute osseous abnormality. IMPRESSION: 1. Persistent right lower lobe airspace disease concerning for pneumonia. Followup PA and lateral chest X-ray is recommended in 3-4 weeks following trial of antibiotic therapy to ensure resolution and exclude underlying  malignancy. Electronically Signed  By: Elige Ko   On: 11/30/2016 12:35   Portable Chest Xray  Result Date: 11/28/2016 CLINICAL DATA:  83 year old male with history of stroke. Evaluate endotracheal tube placement. EXAM: PORTABLE CHEST 1 VIEW COMPARISON:  Chest x-ray 11/27/2016. FINDINGS: An endotracheal tube is in place with tip 3.1 cm above the carina. Lung volumes are low. Linear scarring in the left mid lung is unchanged. New area of interstitial prominence an ill-defined opacities in the right lung base may reflect developing airspace consolidation either within the right middle or lower lobe) favored to be a right lower lobe). Mild blunting of the right costophrenic sulcus, suggestive of a small right pleural effusion. No definite left pleural effusion) however, left costophrenic sulcus is excluded from the image). Crowding of the pulmonary vasculature, without frank pulmonary edema. Heart size is mildly enlarged. The patient is rotated to the left on today's exam, resulting in distortion of the mediastinal contours and reduced diagnostic sensitivity and specificity for mediastinal pathology. Atherosclerosis in the thoracic aorta. IMPRESSION: 1. Support apparatus, as above. 2. New ill-defined opacities in the base of the right lung concerning for developing airspace consolidation, favored to be in the right lower lobe. 3. Probable small right pleural effusion. 4. Aortic atherosclerosis. Electronically Signed   By: Trudie Reed M.D.   On: 11/28/2016 07:16   Portable Chest Xray  Result Date: 11/27/2016 CLINICAL DATA:  Intubation . EXAM: PORTABLE CHEST 1 VIEW COMPARISON:  10/20/2016. FINDINGS: Endotracheal tube tip noted 2.7 cm above the carina. Cardiomegaly with normal pulmonary vascularity. Low lung volumes with mild mild bilateral atelectatic changes. No pleural effusion or pneumothorax . IMPRESSION: 1. Endotracheal tube tip noted 2.7 cm above the carina. 2. Cardiomegaly with normal pulmonary  vascularity. 3. Low lung volumes with mild bilateral atelectatic changes . Electronically Signed   By: Maisie Fus  Register   On: 11/27/2016 09:31   Dg Swallowing Func-speech Pathology  Result Date: 12/01/2016 Objective Swallowing Evaluation: Type of Study: MBS-Modified Barium Swallow Study Patient Details Name: [name] MRN: [id number] Date of Birth: Dec 18, 1926 Today's Date: 12/01/2016 Time: SLP Start Time (ACUTE ONLY): 1015-SLP Stop Time (ACUTE ONLY): 1030 SLP Time Calculation (min) (ACUTE ONLY): 15 min Past Medical History: Past Medical History: Diagnosis Date . AAA (abdominal aortic aneurysm) (HCC)  . Anemia  . Asthma  . Carotid artery disease (HCC)  . CHF (congestive heart failure) (HCC)  . Coronary artery disease  . Depression  . Gout  . H. pylori infection  . Hard of hearing  . Hiatal hernia  . Hyperplasia, prostate  . Hypertension  . PUD (peptic ulcer disease)  . Renal failure, acute (HCC) 11/08/2012 Past Surgical History: Past Surgical History: Procedure Laterality Date . CAROTID STENT INSERTION  2015 . CAROTID STENT INSERTION N/A 05/09/2013  Procedure: CAROTID STENT INSERTION;  Surgeon: Runell Gess, MD;  Location: [facility] CATH LAB;  Service: Cardiovascular;  Laterality: N/A; . ESOPHAGOGASTRODUODENOSCOPY  06/13/2008,12/31/12 . IR PERCUTANEOUS ART THROMBECTOMY/INFUSION INTRACRANIAL INC DIAG ANGIO  11/27/2016 . LEFT HEART CATHETERIZATION WITH CORONARY ANGIOGRAM N/A 05/04/2013  Procedure: LEFT HEART CATHETERIZATION WITH CORONARY ANGIOGRAM;  Surgeon: Peter M Swaz[place]and, MD;  Location: [facility] CATH LAB;  Service: Cardiovascular;  Laterality: N/A; . RADIOLOGY WITH ANESTHESIA N/A 11/27/2016  Procedure: IR WITH ANESTHESIA;  Surgeon: Julieanne Cotton, MD;  Location: [facility] OR;  Service: Radiology;  Laterality: N/A; HPI: Decari Khamjumphonis a 83 y.o.malewith PMH  including multiple strokes in the past, A.fib on xarelto. He presented to [facility] ED 10/17 with acute aphasia and right sided weakness.  Had similar symptoms with  acute CVA 1 month prior. After admission pt underwent complete revascularization of occluded left MCA. Per IR notes, had small hemorrhagic conversion in left caudate and left parietal occipital lobes. Intubated from 10/17-10/19. Increased work of breathing post intubation, possibly baseline due to COPD per notes. Likely some ventilator acquired aspiration pna per notes. MBS on 10/20/16 from prior CVA admission shows swallow [facility] though dys 2 diet recommended given mild oral deficits with missing dentition. Pt is known to have baseline aphasia from prior CVA; last SLE 07/18/16 reported persistent aphasia and resolution of dysarthria.  Subjective: Pt in bed, coughing intermittently (weak, non-productive) Assessment / Plan / Recommendation CHL IP CLINICAL IMPRESSIONS 12/01/2016 Clinical Impression Patient presents with a functional oropharyngeal swallow. Oral phase mildly delayed, suspect a combination of cognitive and respiratory deficits contributing, with swallow triggering intermittently at the pyriform sinuses with thin liquids but with full airway protection. Recommend initiation of conservative diet for energy conservation with good potential to advance.  SLP Visit Diagnosis Dysphagia, unspecified (R13.10) Attention and concentration deficit following -- Frontal lobe and executive function deficit following -- Impact on safety and function Mild aspiration risk   CHL IP TREATMENT RECOMMENDATION 12/01/2016 Treatment Recommendations Therapy as outlined in treatment plan below   Prognosis 12/01/2016 Prognosis for Safe Diet Advancement Good Barriers to Reach Goals Language deficits Barriers/Prognosis Comment -- CHL IP DIET RECOMMENDATION 12/01/2016 SLP Diet Recommendations Dysphagia 1 (Puree) solids;Thin liquid Liquid Administration via Cup;Straw Medication Administration Crushed with puree Compensations Slow rate;Small sips/bites;Monitor for anterior loss Postural Changes Seated upright at 90 degrees   CHL IP OTHER  RECOMMENDATIONS 12/01/2016 Recommended Consults -- Oral Care Recommendations Oral care BID Other Recommendations --   CHL IP FOLLOW UP RECOMMENDATIONS 12/01/2016 Follow up Recommendations None   CHL IP FREQUENCY AND DURATION 12/01/2016 Speech Therapy Frequency (ACUTE ONLY) min 2x/week Treatment Duration 1 week      CHL IP ORAL PHASE 12/01/2016 Oral Phase WFL Oral - Pudding Teaspoon -- Oral - Pudding Cup -- Oral - Honey Teaspoon -- Oral - Honey Cup -- Oral - Nectar Teaspoon -- Oral - Nectar Cup -- Oral - Nectar Straw -- Oral - Thin Teaspoon -- Oral - Thin Cup -- Oral - Thin Straw -- Oral - Puree -- Oral - Mech Soft -- Oral - Regular -- Oral - Multi-Consistency -- Oral - Pill -- Oral Phase - Comment --  CHL IP PHARYNGEAL PHASE 12/01/2016 Pharyngeal Phase Impaired Pharyngeal- Pudding Teaspoon -- Pharyngeal -- Pharyngeal- Pudding Cup -- Pharyngeal -- Pharyngeal- Honey Teaspoon -- Pharyngeal -- Pharyngeal- Honey Cup -- Pharyngeal -- Pharyngeal- Nectar Teaspoon -- Pharyngeal -- Pharyngeal- Nectar Cup -- Pharyngeal -- Pharyngeal- Nectar Straw -- Pharyngeal -- Pharyngeal- Thin Teaspoon -- Pharyngeal -- Pharyngeal- Thin Cup Delayed swallow initiation-vallecula;Delayed swallow initiation-pyriform sinuses Pharyngeal -- Pharyngeal- Thin Straw Delayed swallow initiation-vallecula;Delayed swallow initiation-pyriform sinuses Pharyngeal -- Pharyngeal- Puree WFL Pharyngeal -- Pharyngeal- Mechanical Soft WFL Pharyngeal -- Pharyngeal- Regular -- Pharyngeal -- Pharyngeal- Multi-consistency -- Pharyngeal -- Pharyngeal- Pill (No Data) Pharyngeal -- Pharyngeal Comment --  CHL IP CERVICAL ESOPHAGEAL PHASE 12/01/2016 Cervical Esophageal Phase WFL Pudding Teaspoon -- Pudding Cup -- Honey Teaspoon -- Honey Cup -- Nectar Teaspoon -- Nectar Cup -- Nectar Straw -- Thin Teaspoon -- Thin Cup -- Thin Straw -- Puree -- Mechanical Soft -- Regular -- Multi-consistency -- Pill -- Cervical Esophageal Comment -- CHL IP GO 07/18/2016 Functional Assessment  Tool Used NOMS; clinical judgment Functional Limitations Other Speech Language Pathology Swallow Current Status [phone]) CI Swallow  Goal Status [phone]) CI Swallow Discharge Status [phone]) (None) Motor Speech Current Status [phone]) (None) Motor Speech Goal Status [phone]) (None) Motor Speech Goal Status [phone]) (None) Spoken Language Comprehension Current Status [phone]) (None) Spoken Language Comprehension Goal Status (V7846) (None) Spoken Language Comprehension Discharge Status [phone]) (None) Spoken Language Expression Current Status [phone]) (None) Spoken Language Expression Goal Status [phone]) (None) Spoken Language Expression Discharge Status [phone]) (None) Attention Current Status (D6644) (None) Attention Goal Status (I3474) (None) Attention Discharge Status [phone]) (None) Memory Current Status (L8756) (None) Memory Goal Status (E3329) (None) Memory Discharge Status (J1884) (None) Voice Current Status (Z6606) (None) Voice Goal Status (T0160) (None) Voice Discharge Status (F0932) (None) Other Speech-Language Pathology Functional Limitation Current Status (T5573) CJ Other Speech-Language Pathology Functional Limitation Goal Status (U2025) CI Other Speech-Language Pathology Functional Limitation Discharge Status [phone]) (None) [name] Lango MA, CCC-SLP [phone] McCoy Leah Meryl 12/01/2016, 10:43 AM              Ir Percutaneous Art Thrombectomy/infusion Intracranial Inc Diag Angio  Result Date: 11/28/2016 INDICATION: Acute onset of worsening right-sided weakness and aphasia. Abnormal CT of the brain with a hyperdense left MCA sign, and occluded left middle cerebral artery on CT angiogram. EXAM: 1. EMERGENT LARGE VESSEL OCCLUSION THROMBOLYSIS anterior CIRCULATION) COMPARISON:  CT angiogram of the head and neck of 11/27/2016. MEDICATIONS: Ancef 2 g IV antibiotic was administered within 1 hour of the procedure. ANESTHESIA/SEDATION: General anesthesia CONTRAST:  Isovue 300 50 mL. FLUOROSCOPY TIME:  Fluoroscopy Time:  25 minutes 43 seconds (1054 mGy). COMPLICATIONS: None immediate. TECHNIQUE: Following a full explanation of the procedure along with the potential associated complications, an informed witnessed consent was obtained from the patient's daughter and son. The risks of intracranial hemorrhage of 10%, worsening neurological deficit, ventilator dependency, death and inability to revascularize were all reviewed in detail with the patient's daughter and son. The patient was then put under general anesthesia by the Department of Anesthesiology at [facility]. The right groin was prepped and draped in the usual sterile fashion. Thereafter using modified Seldinger technique, transfemoral access into the right common femoral artery was obtained without difficulty. Over a 0.035 inch guidewire a 5 French Pinnacle sheath was inserted. Through this, and also over a 0.035 inch guidewire a 5 [place] JB 1 catheter was advanced to the aortic arch region and selectively positioned in the left common carotid artery. FINDINGS: The left common carotid arteriogram demonstrates the left external carotid artery origin to be narrowed by about 50% proximally. Its branches are normally opacified. The left internal carotid artery at the bulb demonstrates an approximately 30% stenosis within the previously positioned stent extending from the distal left common carotid artery to the proximal left internal carotid artery. More distally, the left internal carotid artery is seen to opacify normally to the cranial skull base. The petrous, cavernous and supraclinoid segments demonstrate wide patency. The left anterior cerebral artery opacifies normally into the capillary and venous phases. The left middle cerebral artery just distal to the origin of the anterior temporal branch demonstrates complete angiographic occlusion. Also noted is a prominent left posterior communicating artery opacifying the left posterior cerebral artery distribution. An  approximately 2.5 mm infundibulum is seen at the origin of the left posterior communicating artery. PROCEDURE: ENDOVASCULAR COMPLETE REVASCULARIZATION OF OCCLUDED LEFT MIDDLE CEREBRAL ARTERY DISTAL TO THE ANTERIOR TEMPORAL BRANCH WITH 1 PASS WITH THE SOLITAIRE FR 4 MM X 40 MM RETRIEVAL DEVICE. The diagnostic JB 1 catheter in the left common carotid artery was exchanged over  a 0.035 inch 300 cm Rosen exchange guidewire for an 8 French 55 cm Brite tip neurovascular sheath using biplane roadmap technique and constant fluoroscopic guidance. Good aspiration obtained from the side port of the neurovascular sheath. This was then connected to continuous heparinized saline infusion. Over the [employer] guidewire, an 8 [place] 85 cm FlowGate balloon guide catheter which had been prepped with 50% contrast and 50% heparinized saline infusion was advanced and positioned just proximal to the left common carotid bifurcation. The guidewire was removed. Good aspiration obtained from the hub of the 8 [place] FlowGate guide catheter. A gentle contrast injection demonstrated no evidence of spasms, dissections or of intraluminal filling defects. The tip of the 8 [place] FlowGate guide catheter was positioned just proximal to the proximal placement of the carotid stent mentioned above. Using biplane roadmap technique and constant fluoroscopic guidance, in a coaxial manner and with constant heparinized saline infusion, a combination of the 021 Trevo ProVue microcatheter inside of a 5 French 115 cm Catalyst guide catheter was advanced over a 0.014 inch Softip Synchro micro guidewire to the distal end of the [facility] guide catheter. The micro guidewire had a J configuration to avoid dissections or risking entanglement with the struts of the previously positioned stent. This combination was then advanced without difficulty to the supraclinoid left ICA. The Catalyst guide catheter was positioned at the cervical petrous junction of the left  internal carotid artery. Using a torque device, the micro guidewire was then gently advanced without difficulty through the occluded left middle cerebral artery into the inferior division of the left middle cerebral artery M2 M3 region followed by the microcatheter. The guidewire was removed. Good aspiration obtained from the hub of the microcatheter. A gentle contrast injection demonstrated good antegrade flow distal to the tip of the microcatheter. At this time, a 4 mm x 40 mm Solitaire FR retrieval device was then advanced in a coaxial manner and with constant heparinized saline infusion to the distal end of the microcatheter. The O ring on the delivery microcatheter was loosened. With slight forward gentle traction with the right hand on the delivery micro guidewire, with the left hand the delivery microcatheter was retrieved unsheathing the distal and then the proximal portion of the retrieval device. Excellent coverage was noted of the occluded left middle cerebral artery with the proximal portion just proximal to the occluded portion. A control arteriogram performed through the 5 [place] Catalyst guide catheter in the left internal carotid artery demonstrated a TICI 2b reperfusion. At this time, the proximal portion of the retrieval device was captured into the microcatheter. The occlusion balloon of the FlowGate guide catheter was inflated in the distal left common carotid artery proximal to the proximal portion of the stent. Thereafter as constant aspiration was applied with a 60 mL syringe on the side port of the 8 [place] FlowGate guide catheter, and with a 20 mL syringe at the hub of the 5 [place] Catalyst guide catheter, the combination of the retrieval device, the microcatheter and the 5 [place] guide catheter was retrieved and removed as aspiration was continued. The interstices of the retrieval device revealed chunks of clot entangled within the interstices. Also noted was a chunk of clot within the  Tuohy Borst. Aspiration of the hub of the [facility] guide catheter was continued with a 60 mL syringe as the balloon was deflated in the left common carotid artery. Free back bleed of blood was noted at the hub of the 8 [place] FlowGate guide catheter.  A control arteriogram performed through the 8 [place] FlowGate guide catheter in the left common carotid artery demonstrated complete angiographic revascularization of the left middle cerebral artery achieving a 3 reperfusion. Patency was maintained of the left posterior cerebral artery, and the left anterior cerebral artery. No evidence of extravasation of contrast, or mass-effect on the major vessels on the left side of the brain was seen. Throughout the procedure, the patient's neurological status and hemodynamic status remained stable. The 8 [place] FlowGate guide catheter, and the 8 [place] neurovascular sheath were then retrieved into the abdominal aorta and exchanged over a J-tip guidewire for a 9 [place] Pinnacle sheath. This in turn was then connected to continuous heparinized saline infusion. At the end of the procedure, the right groin appeared soft without evidence of bleeding. The distal pulses and the dorsalis pedis and the posterior tibials remained 3+ in the dorsalis pedis palpable, and Dopplerable in the posterior tibials bilaterally unchanged from prior to the procedure. IMPRESSION: Status post endovascular complete revascularization of occluded left middle cerebral artery M1 segment with 1 pass using the Solitaire FR 4 mm x 40 mm retrieval device achieving a TICI 3 reperfusion. PLAN: Patient transferred to the CT suite for postprocedural CT scan of the brain. Electronically Signed   By: Julieanne CottonSanjeev  Deveshwar M.D.   On: 11/27/2016 19:34    Microbiology: Recent Results (from the past 240 hour(s))  MRSA PCR Screening     Status: Abnormal   Collection Time: 11/27/16  4:54 AM  Result Value Ref Range Status   MRSA by PCR POSITIVE (A) NEGATIVE Final     Comment:        The GeneXpert MRSA Assay (FDA approved for NASAL specimens only), is one component of a comprehensive MRSA colonization surveillance program. It is not intended to diagnose MRSA infection nor to guide or monitor treatment for MRSA infections. RESULT CALLED TO, READ BACK BY AND VERIFIED WITH: S. Hocutt RN 13:40 11/27/16 ([email or web address])   Culture, blood (routine x 2)     Status: None   Collection Time: 11/28/16 10:30 AM  Result Value Ref Range Status   Specimen Description BLOOD LEFT FOOT  Final   Special Requests IN PEDIATRIC BOTTLE Blood Culture adequate volume  Final   Culture NO GROWTH 5 DAYS  Final   Report Status 12/03/2016 FINAL  Final  Culture, respiratory (NON-Expectorated)     Status: None   Collection Time: 11/28/16 10:32 AM  Result Value Ref Range Status   Specimen Description TRACHEAL ASPIRATE  Final   Special Requests NONE  Final   Gram Stain   Final    RARE WBC PRESENT, PREDOMINANTLY PMN NO SQUAMOUS EPITHELIAL CELLS SEEN NO ORGANISMS SEEN    Culture Consistent with normal respiratory flora.  Final   Report Status 11/30/2016 FINAL  Final  Culture, blood (routine x 2)     Status: None   Collection Time: 11/28/16 10:50 AM  Result Value Ref Range Status   Specimen Description BLOOD RIGHT FOOT  Final   Special Requests IN PEDIATRIC BOTTLE Blood Culture adequate volume  Final   Culture NO GROWTH 5 DAYS  Final   Report Status 12/03/2016 FINAL  Final  Urine Culture     Status: None   Collection Time: 12/04/16  4:51 PM  Result Value Ref Range Status   Specimen Description URINE, CLEAN CATCH  Final   Special Requests NONE  Final   Culture NO GROWTH  Final   Report Status 12/05/2016 FINAL  Final  Labs: CBC:  Recent Labs Lab 11/30/16 0328 12/01/16 0355 12/03/16 0222 12/04/16 0659  WBC 13.3* 11.8* 8.3 9.3  NEUTROABS 12.8* 11.4* 7.6 7.8*  HGB 9.1* 7.8* 8.3* 8.9*  HCT 27.0* 23.0* 24.7* 27.1*  MCV 91.5 90.6 91.5 92.2  PLT 184 168 175 189    Basic Metabolic Panel:  Recent Labs Lab 11/30/16 0328 12/01/16 0355 12/01/16 1858 12/03/16 0222 12/04/16 0659  NA 133* 129* 134* 132* 132*  K 3.9 2.8* 3.7 3.2* 3.1*  CL 104 100* 104 97* 96*  CO2 16* 19* 20* 24 26  GLUCOSE 162* 224* 87 217* 174*  BUN 20 23* 24* 25* 21*  CREATININE 1.46* 1.56* 1.55* 1.45* 1.49*  CALCIUM 8.9 8.4* 8.7* 8.5* 8.2*  MG 1.9 1.7  --  1.6*  --   PHOS 2.8 3.4  --  3.4  --    Liver Function Tests:  Recent Labs Lab 12/04/16 0659  AST 38  ALT 35  ALKPHOS 44  BILITOT 1.6*  PROT 6.3*  ALBUMIN 2.9*   No results for input(s): LIPASE, AMYLASE in the last 168 hours. No results for input(s): AMMONIA in the last 168 hours. Cardiac Enzymes: No results for input(s): CKTOTAL, CKMB, CKMBINDEX, TROPONINI in the last 168 hours. BNP (last 3 results) No results for input(s): BNP in the last 8760 hours. CBG:  Recent Labs Lab 12/05/16 2107 12/06/16 0255 12/06/16 0623 12/06/16 1140 12/06/16 1656  GLUCAP 217* 170* 135* 182* 329*   Time spent: 35 minutes  Signed:  Analyse Angst  [facility] 12/03/2016 , 6:38 PM

## 2016-12-06 NOTE — IPOC Note (Signed)
Overall Plan of Care [facility](IPOC) Patient Details Name: [name] MRN: [id number] DOB: 01/09/1927  Admitting Diagnosis: <principal problem not specified>left MCA infarct  Hospital Problems: Active Problems:   Left middle cerebral artery stroke [facility](HCC)     Functional Problem List: Nursing Bladder, Bowel, Endurance, Medication Management, Nutrition, Safety, Skin Integrity, Motor  PT Balance, Safety, Endurance, Motor, Perception  OT Balance, Cognition, Endurance, Motor, Perception, Safety, Vision  SLP Cognition  TR         Basic ADL's: OT Eating, Grooming, Bathing, Dressing, Toileting     Advanced  ADL's: OT       Transfers: PT Bed Mobility, Bed to Chair, Car, Occupational psychologisturniture  OT Toilet, Research scientist (life sciences)Tub/Shower     Locomotion: PT Stairs, Ambulation     Additional Impairments: OT    SLP Swallowing, Communication, Social Cognition comprehension, expression Social Interaction, Problem Solving, Memory, Awareness  TR      Anticipated Outcomes Item Anticipated Outcome  Self Feeding S  Swallowing  Max A   Basic self-care  CGA  Toileting  CGA   Bathroom Transfers CGA  Bowel/Bladder  Continent bowel and bladder, no retention, no S/S infection. Anticipates needs  Transfers  min assist  Locomotion  min assist ambulatory with LRAD  Communication  Max A  Cognition  Max A   Pain  Managed at goal 2/10  Safety/Judgment  Increased safety awareness, no falls, injury this admission.   Therapy Plan: PT Intensity: Minimum of 1-2 x/day ,45 to 90 minutes PT Frequency: 5 out of 7 days PT Duration Estimated Length of Stay: 10-12 days OT Intensity: Minimum of 1-2 x/day, 45 to 90 minutes OT Frequency: 5 out of 7 days OT Duration/Estimated Length of Stay: 10-14 days SLP Intensity: Minumum of 1-2 x/day, 30 to 90 minutes SLP Frequency: 3 to 5 out of 7 days SLP Duration/Estimated Length of Stay: 10-12 days     Team Interventions: Nursing Interventions Patient/Family Education, Bladder  Management, Bowel Management, Skin Care/Wound Management, Disease Management/Prevention, Discharge Planning, Psychosocial Support, Dysphagia/Aspiration Precaution Training, Cognitive Remediation/Compensation, Medication Management  PT interventions Ambulation/gait training, Balance/vestibular training, Cognitive remediation/compensation, Discharge planning, DME/adaptive equipment instruction, Functional mobility training, Patient/family education, Neuromuscular re-education, Psychosocial support, Splinting/orthotics, Therapeutic Exercise, Therapeutic Activities, Stair training, UE/LE Strength taining/ROM, UE/LE Coordination activities, Visual/perceptual remediation/compensation  OT Interventions Balance/vestibular training, Cognitive remediation/compensation, Discharge planning, DME/adaptive equipment instruction, Functional mobility training, Neuromuscular re-education, Patient/family education, Psychosocial support, Self Care/advanced ADL retraining, Therapeutic Activities, Therapeutic Exercise, UE/LE Strength taining/ROM, UE/LE Coordination activities, Visual/perceptual remediation/compensation  SLP Interventions Cognitive remediation/compensation, Dysphagia/aspiration precaution training, Internal/external aids, Speech/Language facilitation, Therapeutic Activities, Environmental controls, Cueing hierarchy, Functional tasks, Patient/family education  TR Interventions    SW/CM Interventions Discharge Planning, Psychosocial Support, Patient/Family Education   Barriers to Discharge MD  Medical stability  Nursing      PT Other (comments) history of 9 CVAs  OT      SLP      SW       Team Discharge Planning: Destination: PT-Home ,OT- Home , SLP-Home Projected Follow-up: PT-None, 24 hour supervision/assistance, OT-  Home health OT, SLP-24 hour supervision/assistance, Home Health SLP Projected Equipment Needs: PT-To be determined, OT-  , SLP-None recommended by SLP Equipment Details: PT- ,  OT-TBD Patient/family involved in discharge planning: PT- Patient, Family member/caregiver,  OT-Patient unable/family or caregiver not available, SLP-Family member/caregiver  MD ELOS: 10-12 days Medical Rehab Prognosis:  Excellent Assessment: The patient has been admitted for CIR therapies with the diagnosis of left MCA infarct. The team will be addressing functional  mobility, strength, stamina, balance, safety, adaptive techniques and equipment, self-care, bowel and bladder mgt, patient and caregiver education, NMR, communication/language, mood/behavior. Goals have been set at contact guard to min assist with MOBILITY and self-care, mod to max assist with communication and cognition.    Ranelle Oyster, MD, FAAPMR      See Team Conference Notes for weekly updates to the plan of care

## 2016-12-06 NOTE — Progress Notes (Signed)
Occupational Therapy Session Note  Patient Details  Name: [name] MRN: [id number] Date of Birth: 1926-06-13  Today's Date: 12/06/2016 OT Individual Time: 1275-1700 OT Individual Time Calculation (min): 57 min    Short Term Goals: Week 1:  OT Short Term Goal 1 (Week 1): Pt will follow single step commands during dressing tasks. OT Short Term Goal 2 (Week 1): Pt will notify staff of need to toilet. OT Short Term Goal 3 (Week 1): Pt will complete stand pivot toilet transfer with CGA. OT Short Term Goal 4 (Week 1): Pt will visually identify necessary grooming items to complete grooming routine.  Skilled Therapeutic Interventions/Progress Updates:    1:1. Pt with no c/o pain. Daughter present to translate. Pt declining bathing this session despite encouragement. Pt requires increased time for processing/responding to cues often requiring HOH A to initiate movement 2/2 aphasia. Pt ambulates with MIN HHA to/from toilet with Vc for not furniture walking and sequencing of transfer/toileting. Pt able to advance pants past hips, however requires A for clothing management after toileting and posterior hygiene. Pt dresses EOB with A to thread B sleeves and thread LLE into pants. PT able to thread RLE and advance pants past hips. OT trained daughter and son to transfer pt EOB>BSC and w/c with VC for locking breaks, biomechanics and providing steadying A at hips. Pt stands 3 min with tacilte cues for posture. Exited session with pt supine in bed with family present in room and call light in reach.  Therapy Documentation Precautions:  Precautions Precautions: Fall Restrictions Weight Bearing Restrictions: No  See Function Navigator for Current Functional Status.   Therapy/Group: Individual Therapy  Shon Hale 12/06/2016, 9:59 AM

## 2016-12-06 NOTE — Progress Notes (Signed)
RN called for pt moaning and some increase work of breathing. On arrival pt lying supine in bed, family at bedside, pt moaning unable to verbalizes site of pain, increase moan when palpate lower abd. Family assisted pt with urinal, which seemed to relieve any discomfort. BP 151/75, HR 88, RR 14, 100% RA CXR showed decreased pulmonary edema, stable cardiomegaly, Right lung base atelectasis, less likely early pneumonia.  Encouraged RN to call for any further concerns

## 2016-12-06 NOTE — Progress Notes (Signed)
Physical Therapy Session Note  Patient Details  Name: [name] MRN: [id number] Date of Birth: April 01, 1926  Today's Date: 12/06/2016 PT Individual Time: 1300-1410 PT Individual Time Calculation (min): 70 min   Short Term Goals: Week 1:  PT Short Term Goal 1 (Week 1): Pt will transfer with LRAD and less than 50% cues.  PT Short Term Goal 2 (Week 1): Pt will ambulate 150 with mod assist and LRAD PT Short Term Goal 3 (Week 1): Pt will initiate stair training with PT PT Short Term Goal 4 (Week 1): Pt will attend to task in quiet environment x1 minute with max cues Week 2:     Skilled Therapeutic Interventions/Progress Updates:   Daughter present to attempt translation throughout session.  Pt received supine in bed and agreeable to PT. Supine>sit transfer with min  assist at trunk.  Gait in room with rollator x 10 ft with CGA from PT to [place].   Transported to rehab gym in [facility]. Dynamic gait training to weave through 6 cones x 2 with min assist to steer Rollator and max cues for awareness of obstacles.   PT instructed pt in [place] balance exercises, level A with UE support on RW. Min assist from PT for safety. All exercises completed x 10 BLE with Manual facilitation to initiate correct movement.  Pt required multiple prolonged therapeutic rest breaks due to excessive fatigue.   Pt returned to room and performed ambulatory transfer to bed with min assist. Sit>supine completed with min assist, and pt left supine in bed with call bell in reach and all needs met.       Therapy Documentation Precautions:  Precautions Precautions: Fall Restrictions Weight Bearing Restrictions: No Vital Signs: Therapy Vitals Pulse Rate: 80 BP: (!) 115/56 Pain: Pain Assessment Pain Assessment: Faces Faces Pain Scale: Hurts a little bit Pain Type: Acute pain Pain Location: Abdomen Pain Descriptors / Indicators: Aching Pain Intervention(s): Medication (See eMAR)  See Function Navigator  for Current Functional Status.   Therapy/Group: Individual Therapy  Lorie Phenix 12/06/2016, 2:34 PM

## 2016-12-06 NOTE — Progress Notes (Addendum)
ANTICOAGULATION CONSULT NOTE - Initial Consult  Pharmacy Consult for Warfarin Indication: stroke  Allergies  Allergen Reactions  . Levofloxacin Anaphylaxis and Other (See Comments)    Patient told his daughter that it made him "feel worse than he already did" (overall) Headache also (has refused to take anymore)  . Eliquis [Apixaban] Itching and Swelling  . Phenazopyridine Hcl Other (See Comments)    Unknown reaction  . Septra [Sulfamethoxazole-Trimethoprim] Nausea And Vomiting and Other (See Comments)    GI Upset  . Latex Rash  . Tape Rash    Patient Measurements: Height: 5\' 5"  (165.1 cm) Weight: 141 lb 11.2 oz (64.3 kg) IBW/kg (Calculated) : 61.5  Vital Signs: Temp: 97.7 F (36.5 C) (10/27 0600) Temp Source: Oral (10/27 0600) BP: 143/88 (10/27 0600) Pulse Rate: 82 (10/27 0600)  Labs:  Recent Labs  12/04/16 0659 12/06/16 0626  HGB 8.9*  --   HCT 27.1*  --   PLT 189  --   LABPROT  --  14.4  INR  --  1.13  CREATININE 1.49*  --     Estimated Creatinine Clearance: 29.8 mL/min (A) (by C-G formula based on SCr of 1.49 mg/dL (H)).   Medical History: Past Medical History:  Diagnosis Date  . AAA (abdominal aortic aneurysm) (HCC)   . Anemia   . Asthma   . Carotid artery disease (HCC)   . CHF (congestive heart failure) (HCC)   . Coronary artery disease   . Depression   . Diabetes mellitus type 2 in nonobese (HCC)   . Gout   . H. pylori infection   . Hard of hearing   . Hiatal hernia   . Hyperplasia, prostate   . Hypertension   . PUD (peptic ulcer disease)   . Renal failure, acute (HCC) 11/08/2012    Assessment: 54 yoM with h/o afib and multiple strokes on Xarelto PTA, presents with new left MCA embolic stroke. Pharmacy consulted to start warfarin. Last dose of Xarelto 10/16. Baseline INR 1.13. Hgb low 8.9 but stable, pltc WNL. Note drug interaction with allopurinol, which may enhance the anticoagulant effect of warfarin.  Goal of Therapy:  INR  2.5-3.5 Monitor platelets by anticoagulation protocol: Yes   Plan:  Warfarin 5mg  x1 Daily INR Monitor for s/sx of bleeding or thrombosis  Lacreshia Bondarenko N. Zigmund Daniel, PharmD PGY1 Pharmacy Resident Pager: [phone] 12/06/2016,9:15 AM

## 2016-12-07 ENCOUNTER — Inpatient Hospital Stay (HOSPITAL_COMMUNITY): Payer: Medicare Other | Admitting: Speech Pathology

## 2016-12-07 DIAGNOSIS — E8770 Fluid overload, unspecified: Secondary | ICD-10-CM

## 2016-12-07 DIAGNOSIS — E1165 Type 2 diabetes mellitus with hyperglycemia: Secondary | ICD-10-CM

## 2016-12-07 LAB — GLUCOSE, CAPILLARY
Glucose-Capillary: 210 mg/dL — ABNORMAL HIGH (ref 65–99)
Glucose-Capillary: 375 mg/dL — ABNORMAL HIGH (ref 65–99)
Glucose-Capillary: 149 mg/dL — ABNORMAL HIGH (ref 65–99)

## 2016-12-07 LAB — PROTIME-INR
INR: 1.19
PROTHROMBIN TIME: 15 s (ref 11.4–15.2)

## 2016-12-07 MED ORDER — LOPERAMIDE HCL 2 MG PO CAPS
2.0000 mg | ORAL_CAPSULE | Freq: Four times a day (QID) | ORAL | Status: DC | PRN
  Filled 2016-12-07: qty 1

## 2016-12-07 MED ORDER — WARFARIN SODIUM 5 MG PO TABS
5.0000 mg | ORAL_TABLET | Freq: Once | ORAL | Status: AC
  Administered 2016-12-07: 5 mg via ORAL
  Filled 2016-12-07: qty 1

## 2016-12-07 MED ORDER — PANTOPRAZOLE SODIUM 40 MG PO PACK
40.0000 mg | PACK | Freq: Every day | ORAL | Status: DC
  Administered 2016-12-07 – 2016-12-11 (×5): 40 mg via ORAL
  Filled 2016-12-07 (×3): qty 20

## 2016-12-07 MED ORDER — SACCHAROMYCES BOULARDII 250 MG PO CAPS
250.0000 mg | ORAL_CAPSULE | Freq: Two times a day (BID) | ORAL | Status: DC
  Administered 2016-12-07 – 2016-12-11 (×9): 250 mg via ORAL
  Filled 2016-12-07 (×9): qty 1

## 2016-12-07 MED ORDER — ISOSORBIDE MONONITRATE ER 30 MG PO TB24
15.0000 mg | ORAL_TABLET | Freq: Every day | ORAL | Status: DC
  Administered 2016-12-10: 15 mg via ORAL
  Filled 2016-12-07 (×2): qty 1

## 2016-12-07 MED ORDER — INSULIN ASPART 100 UNIT/ML ~~LOC~~ SOLN
0.0000 [IU] | Freq: Every day | SUBCUTANEOUS | Status: DC
  Administered 2016-12-09: 2 [IU] via SUBCUTANEOUS

## 2016-12-07 MED ORDER — PREDNISONE 5 MG PO TABS
30.0000 mg | ORAL_TABLET | Freq: Every day | ORAL | Status: DC
  Administered 2016-12-08 – 2016-12-09 (×2): 30 mg via ORAL
  Filled 2016-12-07 (×3): qty 1

## 2016-12-07 MED ORDER — INSULIN ASPART 100 UNIT/ML ~~LOC~~ SOLN
0.0000 [IU] | Freq: Three times a day (TID) | SUBCUTANEOUS | Status: DC
  Administered 2016-12-07: 3 [IU] via SUBCUTANEOUS
  Administered 2016-12-07: 9 [IU] via SUBCUTANEOUS
  Administered 2016-12-08: 2 [IU] via SUBCUTANEOUS
  Administered 2016-12-08: 1 [IU] via SUBCUTANEOUS
  Administered 2016-12-08: 3 [IU] via SUBCUTANEOUS
  Administered 2016-12-09: 5 [IU] via SUBCUTANEOUS
  Administered 2016-12-09 – 2016-12-10 (×2): 3 [IU] via SUBCUTANEOUS
  Administered 2016-12-10: 5 [IU] via SUBCUTANEOUS

## 2016-12-07 NOTE — Progress Notes (Signed)
RT note-Called to see patient by nursing. Patient is lying in bed, some moaning, remains on room air. He does have shallow respiration with no wheezing or distress. Patient will receive scheduled treatments.

## 2016-12-07 NOTE — Progress Notes (Signed)
ANTICOAGULATION CONSULT NOTE - Initial Consult  Pharmacy Consult for Warfarin Indication: stroke  Allergies  Allergen Reactions  . Levofloxacin Anaphylaxis and Other (See Comments)    Patient told his daughter that it made him "feel worse than he already did" (overall) Headache also (has refused to take anymore)  . Eliquis [Apixaban] Itching and Swelling  . Phenazopyridine Hcl Other (See Comments)    Unknown reaction  . Septra [Sulfamethoxazole-Trimethoprim] Nausea And Vomiting and Other (See Comments)    GI Upset  . Latex Rash  . Tape Rash    Patient Measurements: Height: 5\' 5"  (165.1 cm) Weight: 119 lb (54 kg) IBW/kg (Calculated) : 61.5  Vital Signs: Temp: 97.6 F (36.4 C) (10/28 0510) Temp Source: Oral (10/28 0510) BP: 138/82 (10/28 0748) Pulse Rate: 91 (10/28 0748)  Labs:  Recent Labs  12/06/16 0626 12/07/16 0606  LABPROT 14.4 15.0  INR 1.13 1.19    Estimated Creatinine Clearance: 26.2 mL/min (A) (by C-G formula based on SCr of 1.49 mg/dL (H)).   Medical History: Past Medical History:  Diagnosis Date  . AAA (abdominal aortic aneurysm) (HCC)   . Anemia   . Asthma   . Carotid artery disease (HCC)   . CHF (congestive heart failure) (HCC)   . Coronary artery disease   . Depression   . Diabetes mellitus type 2 in nonobese (HCC)   . Gout   . H. pylori infection   . Hard of hearing   . Hiatal hernia   . Hyperplasia, prostate   . Hypertension   . PUD (peptic ulcer disease)   . Renal failure, acute (HCC) 11/08/2012    Assessment: 66 yoM with h/o afib and multiple strokes on Xarelto PTA, presents with new left MCA embolic stroke. Pharmacy consulted to start warfarin. Last dose of Xarelto 10/16. INR 1.19 this AM. Hgb low 8.9 but stable, pltc WNL (last CBC 10/25). Note drug interaction with allopurinol, which may enhance the anticoagulant effect of warfarin.  Goal of Therapy:  INR 2.5-3.5 Monitor platelets by anticoagulation protocol: Yes   Plan:   Warfarin 5mg  x1 Daily INR Monitor for s/sx of bleeding or thrombosis  Erin N. Zigmund Daniel, PharmD PGY1 Pharmacy Resident Pager: [phone] 12/07/2016,8:42 AM

## 2016-12-07 NOTE — Progress Notes (Signed)
[place] PHYSICAL MEDICINE & REHABILITATION     PROGRESS NOTE    Subjective/Complaints: No breathing problems. Family states he's restless and "moaning". I asked why he's moaning and daughter stated "he always does this, even at home". CBG's elevated on spot checking. ?loose stool. Family cleaning before NT/RN come into rooom  ROS: Limited due to cognitive/behavioral   Objective: Vital Signs: Blood pressure 138/82, pulse 91, temperature 97.6 F (36.4 C), temperature source Oral, resp. rate 18, height 5\' 5"  (1.651 m), weight 54 kg (119 lb), SpO2 98 %. Dg Chest Port 1 View  Result Date: 12/06/2016 CLINICAL DATA:  Respiratory distress. EXAM: PORTABLE CHEST 1 VIEW COMPARISON:  Chest radiograph December 02, 2016 FINDINGS: Stable cardiomegaly. Calcified aortic knob. LEFT upper lung zone scarring. Decreased pulmonary edema with faint RIGHT lung base airspace opacity. Pulmonary vascular congestion. No pleural effusion. No pneumothorax. Osteopenia. Vascular stent LEFT neck. IMPRESSION: Stable cardiomegaly, decreased pulmonary edema. RIGHT RIGHT lung base atelectasis, less likely early pneumonia. Aortic Atherosclerosis (ICD10-I70.0). Electronically Signed   By: Awilda Metroourtnay  Bloomer M.D.   On: 12/06/2016 03:54   No results for input(s): WBC, HGB, HCT, PLT in the last 72 hours. No results for input(s): NA, K, CL, GLUCOSE, BUN, CREATININE, CALCIUM in the last 72 hours.  Invalid input(s): CO CBG (last 3)   Recent Labs  12/06/16 0623 12/06/16 1140 12/06/16 1656  GLUCAP 135* 182* 329*    Wt Readings from Last 3 Encounters:  12/07/16 54 kg (119 lb)  11/27/16 63.9 kg (140 lb 14 oz)  11/05/16 64.9 kg (143 lb)    Physical Exam:  Constitutional: He appears well-developed.   Frail appearing HENT:  Head: Normocephalic and atraumatic.  Eyes: EOM are normal. Right eye exhibits no discharge. Left eye exhibits no discharge.  Neck: Normal range of motion. Neck supple. No thyromegaly present.   Cardiovascular:   IRR IRR Respiratory: shallow inspirations, no wheezes or rhonchi. No distress GI: BS +, non-tender, non-distended   Musculoskeletal: He exhibits no edema or tenderness.  Neurological: He is alert.  Slow processing .  Moves all 4 limbs during functional activities.   Skin: Skin is warm and dry.  Psychiatric:  Unable to assess due to language, flat  Assessment/Plan: 1. Right hemiparesis and functional deficits secondary to left MCA infarct which require 3+ hours per day of interdisciplinary therapy in a comprehensive inpatient rehab setting. Physiatrist is providing close team supervision and 24 hour management of active medical problems listed below. Physiatrist and rehab team continue to assess barriers to discharge/monitor patient progress toward functional and medical goals.  Function:  Bathing Bathing position Bathing activity did not occur: Refused    Bathing parts      Bathing assist        Upper Body Dressing/Undressing Upper body dressing Upper body dressing/undressing activity did not occur: Refused What is the patient wearing?: Pull over shirt/dress       Pull over shirt/dress - Perfomed by helper: Thread/unthread right sleeve, Thread/unthread left sleeve, Put head through opening, Pull shirt over trunk        Upper body assist Assist Level: Touching or steadying assistance(Pt > 75%)      Lower Body Dressing/Undressing Lower body dressing Lower body dressing/undressing activity did not occur: Refused What is the patient wearing?: Pants, Non-skid slipper socks       Pants- Performed by helper: Thread/unthread right pants leg, Thread/unthread left pants leg, Pull pants up/down, Fasten/unfasten pants   Non-skid slipper socks- Performed by helper: Don/doff right  sock, Don/doff left sock                  Lower body assist Assist for lower body dressing: Touching or steadying assistance (Pt > 75%)      Toileting Toileting Toileting  activity did not occur: No continent bowel/bladder event   Toileting steps completed by helper: Adjust clothing prior to toileting, Performs perineal hygiene, Adjust clothing after toileting Toileting Assistive Devices: Grab bar or rail  Toileting assist Assist level: Two helpers   Transfers Chair/bed transfer   Chair/bed transfer method: Stand pivot Chair/bed transfer assist level: Touching or steadying assistance (Pt > 75%) Chair/bed transfer assistive device: Bedrails, Armrests     Locomotion Ambulation     Max distance: 100 Assist level: Touching or steadying assistance (Pt > 75%)   Wheelchair          Cognition Comprehension Comprehension assist level: Understands basic less than 25% of the time/ requires cueing >75% of the time  Expression Expression assist level: Expresses basis less than 25% of the time/requires cueing >75% of the time.  Social Interaction Social Interaction assist level: Interacts appropriately less than 25% of the time. May be withdrawn or combative.  Problem Solving Problem solving assist level: Solves basic less than 25% of the time - needs direction nearly all the time or does not effectively solve problems and may need a restraint for safety  Memory Memory assist level: Recognizes or recalls less than 25% of the time/requires cueing greater than 75% of the time   Medical Problem List and Plan: 1.  Right sided weakness with aphasia secondary to left MCA infarction embolic secondary to proximal M2 occlusion status post thrombectomy with  revascularization with history of multiple CVAs  -continue therapies  -speaks no English 2.  DVT Prophylaxis/Anticoagulation: Requested pharmacy assistance with beginning coumadin:  goal INR of 2.5-3.5 3. Pain Management: Tylenol as needed 4. Mood: Provide emotional support 5. Neuropsych: This patient is capable of making decisions on his own behalf. 6. Skin/Wound Care: Routine skin checks 7.  Fluids/Electrolytes/Nutrition: encourage PO  -replacing potassium  -recheck labs tomorrow 8. Dysphagia. Dysphagia #1 thin liquid. Follow-up speech therapy 9. Pneumonia. Zosyn/vancomycin  as directed.   -Complete course of prednisone with slow taper  -CXR personally reviewed and without new disease  -oxygen sats stable.   10. Atrial fibrillation. Patient on Xarelto prior to admission. Discuss when to resume anticoagulation of Coumadin to begin the weekend of 12/06/2016. Cardiac rate controlled 11. CKD stage III. Follow-up chemistries serially 12. Diastolic congestive heart failure/CAD. Mildly fluid overload--did better with small dose of lasix---continue daily  -add low dose imdur (per home regimen)--family needs to bring in meds  -yesterday's abg reviewed  -check weight daily  -supplemental oxygen if needed. ?behavioral component to dyspnea 13. Hypertension. Coreg 6.25 mg twice a day 14. Diabetes mellitus and peripheral neuropathy. Hemoglobin A1c 6.0.SSI  -check cbg's qid,  15. MRSA PCR screen positive. Contact precautions 16. Hyperlipidemia. Lipitor 17. Asthma. Continue nebulizers as directed 18. Gout. Zyloprim 300 mg daily 19. ?Dysuria:   observe for patterns, urine with mild odor    -ucx negative   LOS (Days) 4 A FACE TO FACE EVALUATION WAS PERFORMED  Faith Rogue T, MD 12/07/2016 9:05 AM

## 2016-12-08 ENCOUNTER — Inpatient Hospital Stay (HOSPITAL_COMMUNITY): Payer: Medicare Other

## 2016-12-08 ENCOUNTER — Telehealth: Payer: Self-pay

## 2016-12-08 ENCOUNTER — Inpatient Hospital Stay (HOSPITAL_COMMUNITY): Payer: Medicare Other | Admitting: Physical Therapy

## 2016-12-08 DIAGNOSIS — R1030 Lower abdominal pain, unspecified: Secondary | ICD-10-CM

## 2016-12-08 LAB — COMPREHENSIVE METABOLIC PANEL
ALBUMIN: 3.2 g/dL — AB (ref 3.5–5.0)
ALK PHOS: 64 U/L (ref 38–126)
ALT: 38 U/L (ref 17–63)
AST: 31 U/L (ref 15–41)
Anion gap: 9 (ref 5–15)
BILIRUBIN TOTAL: 1.3 mg/dL — AB (ref 0.3–1.2)
BUN: 18 mg/dL (ref 6–20)
CALCIUM: 9 mg/dL (ref 8.9–10.3)
CO2: 30 mmol/L (ref 22–32)
Chloride: 95 mmol/L — ABNORMAL LOW (ref 101–111)
Creatinine, Ser: 1.6 mg/dL — ABNORMAL HIGH (ref 0.61–1.24)
GFR calc Af Amer: 43 mL/min — ABNORMAL LOW (ref >60)
GFR calc non Af Amer: 37 mL/min — ABNORMAL LOW (ref >60)
GLUCOSE: 177 mg/dL — AB (ref 65–99)
Potassium: 3.2 mmol/L — ABNORMAL LOW (ref 3.5–5.1)
Sodium: 134 mmol/L — ABNORMAL LOW (ref 135–145)
TOTAL PROTEIN: 7.1 g/dL (ref 6.5–8.1)

## 2016-12-08 LAB — CBC
HCT: 32.1 % — ABNORMAL LOW (ref 39.0–52.0)
Hemoglobin: 10.3 g/dL — ABNORMAL LOW (ref 13.0–17.0)
MCH: 30.8 pg (ref 26.0–34.0)
MCHC: 32.1 g/dL (ref 30.0–36.0)
MCV: 96.1 fL (ref 78.0–100.0)
Platelets: 214 10*3/uL (ref 150–400)
RBC: 3.34 MIL/uL — ABNORMAL LOW (ref 4.22–5.81)
RDW: 17.3 % — AB (ref 11.5–15.5)
WBC: 16.6 10*3/uL — ABNORMAL HIGH (ref 4.0–10.5)

## 2016-12-08 LAB — GLUCOSE, CAPILLARY
GLUCOSE-CAPILLARY: 166 mg/dL — AB (ref 65–99)
GLUCOSE-CAPILLARY: 174 mg/dL — AB (ref 65–99)
Glucose-Capillary: 127 mg/dL — ABNORMAL HIGH (ref 65–99)
Glucose-Capillary: 242 mg/dL — ABNORMAL HIGH (ref 65–99)

## 2016-12-08 LAB — PROTIME-INR
INR: 2.39
Prothrombin Time: 25.9 seconds — ABNORMAL HIGH (ref 11.4–15.2)

## 2016-12-08 MED ORDER — WARFARIN SODIUM 1 MG PO TABS
1.0000 mg | ORAL_TABLET | Freq: Once | ORAL | Status: AC
  Administered 2016-12-08: 1 mg via ORAL
  Filled 2016-12-08: qty 1

## 2016-12-08 MED ORDER — ALUM & MAG HYDROXIDE-SIMETH 200-200-20 MG/5ML PO SUSP
15.0000 mL | Freq: Four times a day (QID) | ORAL | Status: DC | PRN
  Administered 2016-12-08 – 2016-12-09 (×2): 15 mL via ORAL
  Filled 2016-12-08 (×2): qty 30

## 2016-12-08 NOTE — Telephone Encounter (Signed)
Rn was going to call pts daughter about EEG. Pt is currently in the hospital as of 12/03/2016.

## 2016-12-08 NOTE — Progress Notes (Signed)
Occupational Therapy Session Note  Patient Details  Name: [name] MRN: [id number] Date of Birth: 11-02-1926  Today's Date: 12/08/2016 OT Individual Time: 1100-1158 OT Individual Time Calculation (min): 58 min    Short Term Goals: Week 1:  OT Short Term Goal 1 (Week 1): Pt will follow single step commands during dressing tasks. OT Short Term Goal 2 (Week 1): Pt will notify staff of need to toilet. OT Short Term Goal 3 (Week 1): Pt will complete stand pivot toilet transfer with CGA. OT Short Term Goal 4 (Week 1): Pt will visually identify necessary grooming items to complete grooming routine.  Skilled Therapeutic Interventions/Progress Updates:    Pt sitting EOB with daughter and family present.  Pt declined bathing/dressing this morning.  Daughter stated that RN bathed pt the previous day. Focus on tub transfers, toilet transfers, and toileting.  [name] interpreted during session.  Pt requires steady A to step over into tub using grab bar and sit on seat.  Pt owns tub seat (Medicaid purchased within 3 months) and will use tub seat at home.  Demonstrated use of removable grab bar and discussed precautions.  Pt indicated that he needed to use toilet and transferred to toilet with steady A.  Pt managed clothing but required assistance with hygiene.  [name] provided appropriate level of supervision and assistance.  Pt returned to room and transferred to sitting EOB with family present.   Therapy Documentation Precautions:  Precautions Precautions: Fall Restrictions Weight Bearing Restrictions: No Pain: Pain Assessment Pain Assessment: Faces Faces Pain Scale: Hurts a little Pain Type: Acute pain Pain Location: Abdomen  See Function Navigator for Current Functional Status.   Therapy/Group: Individual Therapy  Rich Brave 12/08/2016, 12:03 PM

## 2016-12-08 NOTE — Progress Notes (Signed)
Discussed meeting tomorrow at 1400 with daughter and including other caregivers in direct communication with PA, nursing. Daughter said she would attempt to get caregivers to meeting.

## 2016-12-08 NOTE — Progress Notes (Signed)
Speech Language Pathology Daily Session Note  Patient Details  Name: [name] MRN: [id number] Date of Birth: 02/20/1926  Today's Date: 12/08/2016 SLP Individual Time: 4403-4742 SLP Individual Time Calculation (min): 52 min  Short Term Goals: Week 1: SLP Short Term Goal 1 (Week 1): Patient will consume current diet with minimal overt s/s of aspiration and Max A verbal cues for use of swallowing compensatory strategies.  SLP Short Term Goal 2 (Week 1): Patient will demonstrate efficient mastication and complete oral clearance without overt s/s of aspiration with trials of Dys. 2 textures over 2 sessiosn prior to upgrade.  SLP Short Term Goal 3 (Week 1): Patient will imitate vowels in 25% of opportunities with Max A multimodal cues.  SLP Short Term Goal 4 (Week 1): Patient will answer basic yes/no questions via multimodal communication with 25% accuracy with Max A multimodal cues.  SLP Short Term Goal 5 (Week 1): Patient will identify functional objects with 25% accuracy and Max A multimodal cues.  SLP Short Term Goal 6 (Week 1): Patient will demonstrate sustained attention to tasks for 10 minutes with Max A verbal cues for redirection.   Skilled Therapeutic Interventions: Skilled ST service focused on dysphagia and cognitive skills. Interpreter was not present for treatment session, nursing called English speaking daughter, however not able to attend skilled ST session, but will be here for OT session at 11:00. SLP facilitated sustained attention and answering basic yes/no  during consumption of medication, repositioning in and out of bed, transferring to bedside commode and following directions during x-ray in room of abdomen responding to yes/no questions with 25% accuracy with Max verbal and visual cues and sustained attention for 10 minutes intervals with Max A verbal cues. Pt expressed abdomenal pain and need to use bedside commode with nonverbal gestures and grunts. Pt demonstrated PO  consumption of thin liquids via straw/cup and pureed textured foods with no overt s/s aspiration and moderate oral holding, however responding to verbal cues to initiate swallow, feeding self in 10% of presented opportunities. Pt was left in bed with call bell within reach and wife in room. Continue ST services.     Function:  Eating Eating   Modified Consistency Diet: Yes Eating Assist Level: Helper feeds patient;Supervision or verbal cues     Helper Scoops Food on Utensil: Every scoop Helper Brings Food to Mouth: Every scoop   Cognition Comprehension Comprehension assist level: Understands basic less than 25% of the time/ requires cueing >75% of the time  Expression   Expression assist level: Expresses basis less than 25% of the time/requires cueing >75% of the time.  Social Interaction Social Interaction assist level: Interacts appropriately less than 25% of the time. May be withdrawn or combative.  Problem Solving Problem solving assist level: Solves basic less than 25% of the time - needs direction nearly all the time or does not effectively solve problems and may need a restraint for safety  Memory Memory assist level: Recognizes or recalls less than 25% of the time/requires cueing greater than 75% of the time    Pain Pain Assessment Pain Assessment: Faces Faces Pain Scale: Hurts whole lot Pain Type: Acute pain Pain Location: Abdomen  Therapy/Group: Individual Therapy  Carmie Lanpher  [facility] 12/08/2016, 9:55 AM

## 2016-12-08 NOTE — Telephone Encounter (Signed)
-----   Message from Micki Riley, MD sent at 12/08/2016  1:32 PM EDT ----- Joneen Roach inform the patient that EEG study shows mild generalized slowing of the brain activity which may be a result of his age and previous strokes. No definite seizure activity or any worrisome findings

## 2016-12-08 NOTE — Progress Notes (Signed)
PHYSICAL MEDICINE & REHABILITATION     PROGRESS NOTE    Subjective/Complaints: RN states that patient is now having belly discomfort. Has had numerous soft stools since getting prune juice Friday night. Pt/family refused imodium. Denied frank pain when I was in the room but ROS is limited due to language/cognition   Objective: Vital Signs: Blood pressure 136/70, pulse 88, temperature 98.2 F (36.8 C), temperature source Oral, resp. rate 18, height 5\' 5"  (1.651 m), weight 54 kg (119 lb 0.8 oz), SpO2 97 %. No results found. No results for input(s): WBC, HGB, HCT, PLT in the last 72 hours. No results for input(s): NA, K, CL, GLUCOSE, BUN, CREATININE, CALCIUM in the last 72 hours.  Invalid input(s): CO CBG (last 3)   Recent Labs  12/07/16 1619 12/07/16 2110 12/08/16 0639  GLUCAP 375* 149* 127*    Wt Readings from Last 3 Encounters:  12/08/16 54 kg (119 lb 0.8 oz)  11/27/16 63.9 kg (140 lb 14 oz)  11/05/16 64.9 kg (143 lb)    Physical Exam:  Constitutional: He appears well-developed.   Frail appearing HENT:  Head: Normocephalic and atraumatic.  Eyes: EOM are normal. Right eye exhibits no discharge. Left eye exhibits no discharge.  Neck: Normal range of motion. Neck supple. No thyromegaly present.  Cardiovascular:   IRR IRR Respiratory: no distress, chest clear GI: BS+, belly soft, NT   Musculoskeletal: He exhibits no edema or tenderness.  Neurological: He is alert. Restless,distracted    Moves all 4 limbs during functional activities.   Skin: Skin is warm and dry.  Psychiatric:  Unable to assess due to language, flat  Assessment/Plan: 1. Right hemiparesis and functional deficits secondary to left MCA infarct which require 3+ hours per day of interdisciplinary therapy in a comprehensive inpatient rehab setting. Physiatrist is providing close team supervision and 24 hour management of active medical problems listed below. Physiatrist and rehab team continue  to assess barriers to discharge/monitor patient progress toward functional and medical goals.  Function:  Bathing Bathing position Bathing activity did not occur: Refused    Bathing parts      Bathing assist        Upper Body Dressing/Undressing Upper body dressing Upper body dressing/undressing activity did not occur: Refused What is the patient wearing?: Pull over shirt/dress       Pull over shirt/dress - Perfomed by helper: Thread/unthread right sleeve, Thread/unthread left sleeve, Put head through opening, Pull shirt over trunk        Upper body assist Assist Level: Touching or steadying assistance(Pt > 75%)      Lower Body Dressing/Undressing Lower body dressing Lower body dressing/undressing activity did not occur: Refused What is the patient wearing?: Pants, Non-skid slipper socks       Pants- Performed by helper: Thread/unthread right pants leg, Thread/unthread left pants leg, Pull pants up/down, Fasten/unfasten pants   Non-skid slipper socks- Performed by helper: Don/doff right sock, Don/doff left sock                  Lower body assist Assist for lower body dressing: Touching or steadying assistance (Pt > 75%)      Toileting Toileting Toileting activity did not occur: No continent bowel/bladder event   Toileting steps completed by helper: Adjust clothing prior to toileting, Performs perineal hygiene, Adjust clothing after toileting Toileting Assistive Devices: Grab bar or rail  Toileting assist Assist level: Touching or steadying assistance (Pt.75%)   Transfers Chair/bed transfer   Chair/bed transfer  method: Stand pivot Chair/bed transfer assist level: Touching or steadying assistance (Pt > 75%) Chair/bed transfer assistive device: Bedrails, Armrests     Locomotion Ambulation     Max distance: 100 Assist level: Touching or steadying assistance (Pt > 75%)   Wheelchair          Cognition Comprehension Comprehension assist level:  Understands basic less than 25% of the time/ requires cueing >75% of the time  Expression Expression assist level: Expresses basis less than 25% of the time/requires cueing >75% of the time.  Social Interaction Social Interaction assist level: Interacts appropriately less than 25% of the time. May be withdrawn or combative.  Problem Solving Problem solving assist level: Solves basic less than 25% of the time - needs direction nearly all the time or does not effectively solve problems and may need a restraint for safety  Memory Memory assist level: Recognizes or recalls less than 25% of the time/requires cueing greater than 75% of the time   Medical Problem List and Plan: 1.  Right sided weakness with aphasia secondary to left MCA infarction embolic secondary to proximal M2 occlusion status post thrombectomy with  revascularization with history of multiple CVAs  -continue therapies  -speaks no English 2.  DVT Prophylaxis/Anticoagulation: Requested pharmacy assistance with beginning coumadin:  goal INR of 2.5-3.5 3. Pain Management: Tylenol as needed 4. Mood: Provide emotional support 5. Neuropsych: This patient is capable of making decisions on his own behalf. 6. Skin/Wound Care: Routine skin checks 7. Fluids/Electrolytes/Nutrition: encourage PO  -replacing potassium  -recheck labs today 8. Dysphagia. Dysphagia #1 thin liquid. Follow-up speech therapy 9. Pneumonia. Zosyn/vancomycin  as directed.   -Complete course of prednisone with slow taper  -CXR personally reviewed and without new disease  -oxygen sats stable.   10. Atrial fibrillation. Patient on Xarelto prior to admission. Discuss when to resume anticoagulation of Coumadin to begin the weekend of 12/06/2016. Cardiac rate controlled 11. CKD stage III. Follow-up chemistries serially 12. Diastolic congestive heart failure/CAD. Mildly fluid overload--did better with small dose of lasix---continue daily  -will hold on imdur until he's able  to take pills non-crushed  -weight daily  -supplemental oxygen if needed. ?behavioral component to dyspnea 13. Hypertension. Coreg 6.25 mg twice a day 14. Diabetes mellitus and peripheral neuropathy. Hemoglobin A1c 6.0.SSI  -check cbg's qid,   -weaning prednisone 15. MRSA PCR screen positive. Contact precautions 16. Hyperlipidemia. Lipitor 17. Asthma. Continue nebulizers as directed 18. Gout. Zyloprim 300 mg daily 19. ?Dysuria/abdominal pain with multiple soft stools:        -ucx negative  -check kub, cmet, cbc  -add prn maalox  -suspect there is a behavioral/cognitive component. There has been a different complaint each of the last 3 days   LOS (Days) 5 A FACE TO FACE EVALUATION WAS PERFORMED  Faith RogueSWARTZ,Alazae Crymes T, MD 12/08/2016 9:00 AM

## 2016-12-08 NOTE — Progress Notes (Signed)
Physical Therapy Session Note  Patient Details  Name: [name] MRN: [id number] Date of Birth: September 26, 1926  Today's Date: 12/08/2016 PT Individual Time: 1300-1400 and 1630-1700 PT Individual Time Calculation (min): 60 min and 30 min (total 90 min)   Short Term Goals: Week 1:  PT Short Term Goal 1 (Week 1): Pt will transfer with LRAD and less than 50% cues.  PT Short Term Goal 2 (Week 1): Pt will ambulate 150 with mod assist and LRAD PT Short Term Goal 3 (Week 1): Pt will initiate stair training with PT PT Short Term Goal 4 (Week 1): Pt will attend to task in quiet environment x1 minute with max cues  Skilled Therapeutic Interventions/Progress Updates: Tx 1: Pt received supine in bed, pt's daughter present to translate throughout session. Pt occasionally attempts to talk to therapist however daughter reports she cannot understand what he is saying with the exception of him one time saying "I'm ready to go" but does not specify if he means home/room/bathroom. Gait with rollator throughout session min guard; ambulates at most 150', however later trials limited to 75-100' before fatigued and requiring seated rest break on rollator. Performed couch transfer x2 and bed mobility on flat bed with S. Pt uses restroom with close S to transfer to/from toilet, daughter providing S for clothing management and hygiene, however pt unable to void at this time. Attempted to engage pt in LE strengthening exercises however unable to follow attempts at multi-modal cueing. Gait to return to room as above. Remained seated on EOB with daughter present, all needs in reach.   Tx 2: Pt received seated in bed, no evidence of pain and agreeable to treatment. Pt ambulated to/from day room x150' each direction with min guard/S with rollator, slow speed and occasional short standing rest breaks. Pt performed nustep x5 min total with BUE/BLE for aerobic endurance and strengthening. Requires several rest breaks due to  fatigue, daughter present and encourages pt to continue. Daughter verbalizes that pt had been very frustrated earlier in the afternoon, reports he was trying to ask her for something but she could not understand him and he got very upset. Pt returned to room as above; remained seated in hardback chair beside bed and daughter present who is checked off to assist with stand pivot transfers. All needs in reach.      Therapy Documentation Precautions:  Precautions Precautions: Fall Restrictions Weight Bearing Restrictions: No   See Function Navigator for Current Functional Status.   Therapy/Group: Individual Therapy  Vista Lawman 12/08/2016, 1:56 PM

## 2016-12-08 NOTE — Plan of Care (Signed)
Problem: RH SAFETY Goal: RH STG DECREASED RISK OF FALL WITH ASSISTANCE STG Decreased Risk of Fall With mod Assistance.  Outcome: Not Progressing Daughter notified she was going to the car for a few min. RN put bed alarm on. Patient got out of bed and onto [facility]. RN and NT responded. Placed patient on low bed. Educated family.

## 2016-12-08 NOTE — Progress Notes (Signed)
ANTICOAGULATION CONSULT NOTE - Initial Consult  Pharmacy Consult for Warfarin Indication: stroke  Allergies  Allergen Reactions  . Levofloxacin Anaphylaxis and Other (See Comments)    Patient told his daughter that it made him "feel worse than he already did" (overall) Headache also (has refused to take anymore)  . Eliquis [Apixaban] Itching and Swelling  . Phenazopyridine Hcl Other (See Comments)    Unknown reaction  . Septra [Sulfamethoxazole-Trimethoprim] Nausea And Vomiting and Other (See Comments)    GI Upset  . Latex Rash  . Tape Rash    Patient Measurements: Height: 5\' 5"  (165.1 cm) Weight: 119 lb 0.8 oz (54 kg) IBW/kg (Calculated) : 61.5  Vital Signs: Temp: 98.2 F (36.8 C) (10/29 0611) Temp Source: Oral (10/29 1552) BP: 188/64 (10/29 0900) Pulse Rate: 82 (10/29 0900)  Labs:  Recent Labs  12/06/16 0626 12/07/16 0606 12/08/16 1218  HGB  --   --  10.3*  HCT  --   --  32.1*  PLT  --   --  214  LABPROT 14.4 15.0 25.9*  INR 1.13 1.19 2.39  CREATININE  --   --  1.60*    Estimated Creatinine Clearance: 24.4 mL/min (A) (by C-G formula based on SCr of 1.6 mg/dL (H)).  Assessment: 89 yoM on xarelto pta for afib, with hx of multiple strokes, anticoagulation was originally  on hold d/t small hemorrhagic conversion. Started warfarin 10/27, INR 1.19 >?2.39. Hgb 10.3 stable, pltc wnl.   Goal of Therapy:  INR 2.5-3.5 (per neuro) Monitor platelets by anticoagulation protocol: Yes   Plan:  Warfarin 1mg  x1 Daily INR Monitor for s/sx of bleeding or thrombosis  Bayard Hugger, PharmD, BCPS  Clinical Pharmacist  Pager: [phone]   12/08/2016,2:09 PM

## 2016-12-09 ENCOUNTER — Inpatient Hospital Stay (HOSPITAL_COMMUNITY): Payer: Medicare Other

## 2016-12-09 ENCOUNTER — Ambulatory Visit: Payer: Medicare Other | Admitting: Nurse Practitioner

## 2016-12-09 ENCOUNTER — Inpatient Hospital Stay (HOSPITAL_COMMUNITY): Payer: Medicare Other | Admitting: Physical Therapy

## 2016-12-09 LAB — GLUCOSE, CAPILLARY
GLUCOSE-CAPILLARY: 115 mg/dL — AB (ref 65–99)
GLUCOSE-CAPILLARY: 226 mg/dL — AB (ref 65–99)
Glucose-Capillary: 227 mg/dL — ABNORMAL HIGH (ref 65–99)
Glucose-Capillary: 261 mg/dL — ABNORMAL HIGH (ref 65–99)

## 2016-12-09 LAB — URINALYSIS, ROUTINE W REFLEX MICROSCOPIC
Bacteria, UA: NONE SEEN
Bilirubin Urine: NEGATIVE
GLUCOSE, UA: 150 mg/dL — AB
Hgb urine dipstick: NEGATIVE
Ketones, ur: NEGATIVE mg/dL
Nitrite: NEGATIVE
PH: 7 (ref 5.0–8.0)
PROTEIN: NEGATIVE mg/dL
SQUAMOUS EPITHELIAL / LPF: NONE SEEN
Specific Gravity, Urine: 1.013 (ref 1.005–1.030)

## 2016-12-09 LAB — PROTIME-INR
INR: 2.77
PROTHROMBIN TIME: 29 s — AB (ref 11.4–15.2)

## 2016-12-09 MED ORDER — WARFARIN SODIUM 2 MG PO TABS
2.0000 mg | ORAL_TABLET | Freq: Once | ORAL | Status: AC
  Administered 2016-12-09: 2 mg via ORAL
  Filled 2016-12-09: qty 1

## 2016-12-09 NOTE — Progress Notes (Signed)
Translator made available to daughter, son, and spouse of patient for conversation with PA about plan of care. Discussed concerns of family and plan to address. Family verbalized appreciation for opportunity.

## 2016-12-09 NOTE — Progress Notes (Signed)
Physical Therapy Session Note  Patient Details  Name: [name] MRN: [id number] Date of Birth: 03/04/1926  Today's Date: 12/09/2016 PT Individual Time: 1300-1400 PT Individual Time Calculation (min): 60 min   Short Term Goals: Week 1:  PT Short Term Goal 1 (Week 1): Pt will transfer with LRAD and less than 50% cues.  PT Short Term Goal 2 (Week 1): Pt will ambulate 150 with mod assist and LRAD PT Short Term Goal 3 (Week 1): Pt will initiate stair training with PT PT Short Term Goal 4 (Week 1): Pt will attend to task in quiet environment x1 minute with max cues  Skilled Therapeutic Interventions/Progress Updates: Pt received seated in bed with family present, no evidence of pain and agreeable to treatment. Gait to/from gym with rollator [phone] with S. Performed ascent/descent of 12 stairs total, 3" and 6" steps with 1 handrail and close S. Sit <>stand x5 reps for LE strengthening; requires max cues for repetitive performance. Standing balance on airex foam pad 3x1 min with light UE support for focus on ankle strategy. Gait with rollator x75' with min guard; pt began attempting to take pants down in the middle of the hallway indicating that he needed to urinate. Able to redirect pt and ambulate to closest bathroom. Daughter provided close S for toileting and clothing management. Returned to room totalA in w/c d/t fatigue; stand pivot to return to bed with S. Remained seated on EOB with family and RN present,all needs in reach.      Therapy Documentation Precautions:  Precautions Precautions: Fall Restrictions Weight Bearing Restrictions: No   See Function Navigator for Current Functional Status.   Therapy/Group: Individual Therapy  Vista Lawman 12/09/2016, 2:23 PM

## 2016-12-09 NOTE — Progress Notes (Signed)
ANTICOAGULATION CONSULT NOTE - Initial Consult  Pharmacy Consult for Warfarin Indication: stroke  Allergies  Allergen Reactions  . Levofloxacin Anaphylaxis and Other (See Comments)    Patient told his daughter that it made him "feel worse than he already did" (overall) Headache also (has refused to take anymore)  . Eliquis [Apixaban] Itching and Swelling  . Phenazopyridine Hcl Other (See Comments)    Unknown reaction  . Septra [Sulfamethoxazole-Trimethoprim] Nausea And Vomiting and Other (See Comments)    GI Upset  . Latex Rash  . Tape Rash    Patient Measurements: Height: 5\' 5"  (165.1 cm) Weight: 119 lb 4.7 oz (54.1 kg) IBW/kg (Calculated) : 61.5  Vital Signs: Temp: 98 F (36.7 C) (10/30 0545) Temp Source: Oral (10/30 0545) BP: 110/64 (10/30 0959) Pulse Rate: 87 (10/30 0959)  Labs:  Recent Labs  12/07/16 0606 12/08/16 1218 12/09/16 0502  HGB  --  10.3*  --   HCT  --  32.1*  --   PLT  --  214  --   LABPROT 15.0 25.9* 29.0*  INR 1.19 2.39 2.77  CREATININE  --  1.60*  --     Estimated Creatinine Clearance: 24.4 mL/min (A) (by C-G formula based on SCr of 1.6 mg/dL (H)).  Assessment: 89 yoM on xarelto pta for afib, with hx of multiple strokes, anticoagulation was originally  on hold d/t small hemorrhagic conversion. Started warfarin 10/27, INR 2.77 Hgb 10.3 stable, pltc wnl.   Goal of Therapy:  INR 2.5-3.5 (per neuro) Monitor platelets by anticoagulation protocol: Yes   Plan:  Warfarin 2mg  x1 Daily INR Monitor for s/sx of bleeding or thrombosis  Bayard Hugger, PharmD, BCPS  Clinical Pharmacist  Pager: [phone]   12/09/2016,2:34 PM

## 2016-12-09 NOTE — Progress Notes (Signed)
Physical Therapy Session Note  Patient Details  Name: [name] MRN: [id number] Date of Birth: 11-May-1926  Today's Date: 12/09/2016 PT Individual Time: 1016-1045 PT Individual Time Calculation (min): 29 min   Short Term Goals: Week 1:  PT Short Term Goal 1 (Week 1): Pt will transfer with LRAD and less than 50% cues.  PT Short Term Goal 2 (Week 1): Pt will ambulate 150 with mod assist and LRAD PT Short Term Goal 3 (Week 1): Pt will initiate stair training with PT PT Short Term Goal 4 (Week 1): Pt will attend to task in quiet environment x1 minute with max cues  Skilled Therapeutic Interventions/Progress Updates:    Pt seated EOB with daughter upon PT arrival, agreeable to therapy tx and denies pain. Pt ambulated from room<>dayroom x 150 ft each way with min assist and verbal cues to avoid obstacles. Standing at the high table pt worked on balance and UE NMR in order to transfer and stack cups from one stack to another stack, hand over hand assist at first fading to tactile cues with practice and repetition. Pt left seated EOB at end of session with daughter present and needs in reach.   Therapy Documentation Precautions:  Precautions Precautions: Fall Restrictions Weight Bearing Restrictions: No   See Function Navigator for Current Functional Status.   Therapy/Group: Individual Therapy  Cresenciano Genre, PT, DPT 12/09/2016, 12:07 PM

## 2016-12-09 NOTE — Progress Notes (Signed)
[facility] PHYSICAL MEDICINE & REHABILITATION     PROGRESS NOTE    Subjective/Complaints: No new issues over night. Still with poor safety awareness.   ROS: limited due to language   Objective: Vital Signs: Blood pressure (!) 155/65, pulse 88, temperature 98 F (36.7 C), temperature source Oral, resp. rate 18, height 5\' 5"  (1.651 m), weight 54.1 kg (119 lb 4.7 oz), SpO2 99 %. Dg Abd 1 View  Result Date: 12/08/2016 CLINICAL DATA:  Cerebral infarction and diarrhea. EXAM: ABDOMEN - 1 VIEW COMPARISON:  CT of the abdomen and pelvis on 07/31/2015 FINDINGS: No evidence of significant bowel obstruction or ileus. No abnormal calcifications. Diffuse degenerative disease noted of the lumbar spine. IMPRESSION: No significant findings. Electronically Signed   By: Irish LackGlenn  Yamagata M.D.   On: 12/08/2016 09:31    Recent Labs  12/08/16 1218  WBC 16.6*  HGB 10.3*  HCT 32.1*  PLT 214    Recent Labs  12/08/16 1218  NA 134*  K 3.2*  CL 95*  GLUCOSE 177*  BUN 18  CREATININE 1.60*  CALCIUM 9.0   CBG (last 3)   Recent Labs  12/08/16 1700 12/08/16 2056 12/09/16 0626  GLUCAP 242* 174* 115*    Wt Readings from Last 3 Encounters:  12/09/16 54.1 kg (119 lb 4.7 oz)  11/27/16 63.9 kg (140 lb 14 oz)  11/05/16 64.9 kg (143 lb)    Physical Exam:  Constitutional: He appears well-developed.   Frail appearing HENT:  Head: Normocephalic and atraumatic.  Eyes: EOM are normal. Right eye exhibits no discharge. Left eye exhibits no discharge.  Neck: Normal range of motion. Neck supple. No thyromegaly present.  Cardiovascular:   IRR IRR Respiratory: chest clear GI: BS+, belly soft, NT   Musculoskeletal: He exhibits no edema or tenderness.  Neurological: He is alert.      Moves all 4 limbs during functional activities.   Skin: Skin is warm and dry.  Psychiatric:  Unable to assess due to language, flat. Follows some commands  Assessment/Plan: 1. Right hemiparesis and functional  deficits secondary to left MCA infarct which require 3+ hours per day of interdisciplinary therapy in a comprehensive inpatient rehab setting. Physiatrist is providing close team supervision and 24 hour management of active medical problems listed below. Physiatrist and rehab team continue to assess barriers to discharge/monitor patient progress toward functional and medical goals.  Function:  Bathing Bathing position Bathing activity did not occur: Refused    Bathing parts      Bathing assist        Upper Body Dressing/Undressing Upper body dressing Upper body dressing/undressing activity did not occur: Refused What is the patient wearing?: Pull over shirt/dress       Pull over shirt/dress - Perfomed by helper: Thread/unthread right sleeve, Thread/unthread left sleeve, Put head through opening, Pull shirt over trunk        Upper body assist Assist Level: Touching or steadying assistance(Pt > 75%)      Lower Body Dressing/Undressing Lower body dressing Lower body dressing/undressing activity did not occur: Refused What is the patient wearing?: Pants, Non-skid slipper socks       Pants- Performed by helper: Thread/unthread right pants leg, Thread/unthread left pants leg, Pull pants up/down, Fasten/unfasten pants   Non-skid slipper socks- Performed by helper: Don/doff right sock, Don/doff left sock                  Lower body assist Assist for lower body dressing:  (total assist)  Toileting Toileting Toileting activity did not occur: No continent bowel/bladder event Toileting steps completed by patient: Adjust clothing prior to toileting, Adjust clothing after toileting Toileting steps completed by helper: Performs perineal hygiene Toileting Assistive Devices: Grab bar or rail  Toileting assist Assist level: Touching or steadying assistance (Pt.75%)   Transfers Chair/bed transfer   Chair/bed transfer method: Stand pivot Chair/bed transfer assist level:  Touching or steadying assistance (Pt > 75%) Chair/bed transfer assistive device: Bedrails, Armrests     Locomotion Ambulation     Max distance: 150 Assist level: Touching or steadying assistance (Pt > 75%)   Wheelchair          Cognition Comprehension Comprehension assist level: Understands basic less than 25% of the time/ requires cueing >75% of the time  Expression Expression assist level: Expresses basis less than 25% of the time/requires cueing >75% of the time.  Social Interaction Social Interaction assist level: Interacts appropriately less than 25% of the time. May be withdrawn or combative.  Problem Solving Problem solving assist level: Solves basic less than 25% of the time - needs direction nearly all the time or does not effectively solve problems and may need a restraint for safety  Memory Memory assist level: Recognizes or recalls less than 25% of the time/requires cueing greater than 75% of the time   Medical Problem List and Plan: 1.  Right sided weakness with aphasia secondary to left MCA infarction embolic secondary to proximal M2 occlusion status post thrombectomy with  revascularization with history of multiple CVAs  -continue therapies  -speaks no English 2.  DVT Prophylaxis/Anticoagulation: Requested pharmacy assistance with beginning coumadin:  goal INR of 2.5-3.5 3. Pain Management: Tylenol as needed 4. Mood: Provide emotional support 5. Neuropsych: This patient is capable of making decisions on his own behalf. 6. Skin/Wound Care: Routine skin checks 7. Fluids/Electrolytes/Nutrition: encourage PO  -continue replacing potassium  -labs all reviewed.  8. Dysphagia. Dysphagia #1 thin liquid. Follow-up speech therapy 9. Pneumonia. Zosyn/vancomycin  as directed.   -Complete course of prednisone with slow taper  -CXR personally reviewed and without new disease  -oxygen sats stable.   10. Atrial fibrillation. Patient on Xarelto prior to admission. Discuss when to  resume anticoagulation of Coumadin to begin the weekend of 12/06/2016. Cardiac rate controlled 11. CKD stage III. Follow-up chemistries serially 12. Diastolic congestive heart failure/CAD. Mildly fluid overload--did better with small dose of lasix---continue daily  -will hold on imdur until he's able to take pills non-crushed  -weight daily  -supplemental oxygen if needed. ?behavioral component to dyspnea 13. Hypertension. Coreg 6.25 mg twice a day 14. Diabetes mellitus and peripheral neuropathy. Hemoglobin A1c 6.0.SSI  -check cbg's qid,   -weaning prednisone 15. MRSA PCR screen positive. Contact precautions 16. Hyperlipidemia. Lipitor 17. Asthma. Continue nebulizers as directed 18. Gout. Zyloprim 300 mg daily 19. ?Dysuria/abdominal pain with multiple soft stools:        -ucx negative  -check kub, cmet, cbc  -added prn maalox  -suspect there is a behavioral/cognitive component. There have been numerous complaints   LOS (Days) 6 A FACE TO FACE EVALUATION WAS PERFORMED  Ranelle Oyster, MD 12/09/2016 8:53 AM

## 2016-12-09 NOTE — Discharge Instructions (Addendum)
Inpatient Rehab Discharge Instructions  [name] Discharge date and time: No discharge date for patient encounter.   Activities/Precautions/ Functional Status: Activity: activity as tolerated Diet: Dysphasia 1 thin liquids Wound Care: none needed Functional status:  ___ No restrictions     ___ Walk up steps independently ___ 24/7 supervision/assistance   ___ Walk up steps with assistance ___ Intermittent supervision/assistance  ___ Bathe/dress independently ___ Walk with walker     _x__ Bathe/dress with assistance ___ Walk Independently    ___ Shower independently ___ Walk with assistance    ___ Shower with assistance ___ No alcohol     ___ Return to work/school ________    COMMUNITY REFERRALS UPON DISCHARGE:    Home Health:   PT     OT     ST                      Agency:  [facility] Phone: [phone]   Medical Equipment/Items Ordered:  Wheelchair and cushion                                                      Agency/Supplier:  [facility] [phone]        Special Instructions: Home health nurse to check INR 12/15/2016 results to Dr. Benedetto GoadFred Robinson [phone] fax number [phone]. Goal INR of 2.5-3.5 STROKE/TIA DISCHARGE INSTRUCTIONS SMOKING Cigarette smoking nearly doubles your risk of having a stroke & is the single most alterable risk factor  If you smoke or have smoked in the last 12 months, you are advised to quit smoking for your health.  Most of the excess cardiovascular risk related to smoking disappears within a year of stopping.  Ask you doctor about anti-smoking medications  [place] Quit Line: 1-800-QUIT NOW  Free Smoking Cessation Classes [phone]  CHOLESTEROL Know your levels; limit fat & cholesterol in your diet  Lipid Panel     Component Value Date/Time   CHOL 89 11/28/2016 0442   CHOL 122 09/09/2013 0815   TRIG 52 11/30/2016 0515   HDL 28 (L) 11/28/2016 0442   HDL 35 (L) 09/09/2013 0815   CHOLHDL 3.2 11/28/2016 0442   VLDL 20 11/28/2016 0442   LDLCALC 41 11/28/2016 0442   LDLCALC 50 09/09/2013 0815      Many patients benefit from treatment even if their cholesterol is at goal.  Goal: Total Cholesterol (CHOL) less than 160  Goal:  Triglycerides (TRIG) less than 150  Goal:  HDL greater than 40  Goal:  LDL (LDLCALC) less than 100   BLOOD PRESSURE American Stroke Association blood pressure target is less that 120/80 mm/Hg  Your discharge blood pressure is:  BP: (!) 144/83  Monitor your blood pressure  Limit your salt and alcohol intake  Many individuals will require more than one medication for high blood pressure  DIABETES (A1c is a blood sugar average for last 3 months) Goal HGBA1c is under 7% (HBGA1c is blood sugar average for last 3 months)  Diabetes:    Lab Results  Component Value Date   HGBA1C 6.0 (H) 11/28/2016     Your HGBA1c can be lowered with medications, healthy diet, and exercise.  Check your blood sugar as directed by your physician  Call your physician if you experience unexplained or low blood sugars.  PHYSICAL ACTIVITY/REHABILITATION  Goal is 30 minutes at least 4 days per week  Activity: Increase activity slowly, Therapies: Physical Therapy: Home Health Return to work:   Activity decreases your risk of heart attack and stroke and makes your heart stronger.  It helps control your weight and blood pressure; helps you relax and can improve your mood.  Participate in a regular exercise program.  Talk with your doctor about the best form of exercise for you (dancing, walking, swimming, cycling).  DIET/WEIGHT Goal is to maintain a healthy weight  Your discharge diet is: DIET - DYS 1 Room service appropriate? Yes; Fluid consistency: Thin  liquids Your height is:  Height: 5\' 5"  (165.1 cm) Your current weight is: Weight: 54.1 kg (119 lb 5.1 oz) Your Body Mass Index (BMI) is:  BMI (Calculated): 19.86  Following the type of diet specifically designed for you will help prevent  another stroke.  Your goal weight range is:    Your goal Body Mass Index (BMI) is 19-24.  Healthy food habits can help reduce 3 risk factors for stroke:  High cholesterol, hypertension, and excess weight.  RESOURCES Stroke/Support Group:  Call [phone]   STROKE EDUCATION PROVIDED/REVIEWED AND GIVEN TO PATIENT Stroke warning signs and symptoms How to activate emergency medical system (call 911). Medications prescribed at discharge. Need for follow-up after discharge. Personal risk factors for stroke. Pneumonia vaccine given:  Flu vaccine given:  My questions have been answered, the writing is legible, and I understand these instructions.  I will adhere to these goals & educational materials that have been provided to me after my discharge from the hospital.      My questions have been answered and I understand these instructions. I will adhere to these goals and the provided educational materials after my discharge from the hospital.  Patient/Caregiver Signature _______________________________ Date __________  Clinician Signature _______________________________________ Date __________  Please bring this form and your medication list with you to all your follow-up doctor's appointments. Information on my medicine - Coumadin   (Warfarin)  This medication education was reviewed with me or my healthcare representative as part of my discharge preparation.    Why was Coumadin prescribed for you? Coumadin was prescribed for you because you have a blood clot or a medical condition that can cause an increased risk of forming blood clots. Blood clots can cause serious health problems by blocking the flow of blood to the heart, lung, or brain. Coumadin can prevent harmful blood clots from forming. As a reminder your indication for Coumadin is:   Stroke Prevention Because Of Atrial Fibrillation  What test will check on my response to Coumadin? While on Coumadin (warfarin) you will need to  have an INR test regularly to ensure that your dose is keeping you in the desired range. The INR (international normalized ratio) number is calculated from the result of the laboratory test called prothrombin time (PT).  If an INR APPOINTMENT HAS NOT ALREADY BEEN MADE FOR YOU please schedule an appointment to have this lab work done by your health care provider within 7 days. Your INR goal is usually a number between:  2.5 to 3.5.  What  do you need to  know  About  COUMADIN? Take Coumadin (warfarin) exactly as prescribed by your healthcare provider about the same time each day.  DO NOT stop taking without talking to the doctor who prescribed the medication.  Stopping without other blood clot prevention medication to take the place of Coumadin may increase your risk of  developing a new clot or stroke.  Get refills before you run out.  What do you do if you miss a dose? If you miss a dose, take it as soon as you remember on the same day then continue your regularly scheduled regimen the next day.  Do not take two doses of Coumadin at the same time.  Important Safety Information A possible side effect of Coumadin (Warfarin) is an increased risk of bleeding. You should call your healthcare provider right away if you experience any of the following: ? Bleeding from an injury or your nose that does not stop. ? Unusual colored urine (red or dark brown) or unusual colored stools (red or black). ? Unusual bruising for unknown reasons. ? A serious fall or if you hit your head (even if there is no bleeding).  Some foods or medicines interact with Coumadin (warfarin) and might alter your response to warfarin. To help avoid this: ? Eat a balanced diet, maintaining a consistent amount of Vitamin K. ? Notify your provider about major diet changes you plan to make. ? Avoid alcohol or limit your intake to 1 drink for women and 2 drinks for men per day. (1 drink is 5 oz. wine, 12 oz. beer, or 1.5 oz.  liquor.)  Make sure that ANY health care provider who prescribes medication for you knows that you are taking Coumadin (warfarin).  Also make sure the healthcare provider who is monitoring your Coumadin knows when you have started a new medication including herbals and non-prescription products.  Coumadin (Warfarin)  Major Drug Interactions  Increased Warfarin Effect Decreased Warfarin Effect  Alcohol (large quantities) Antibiotics (esp. Septra/Bactrim, Flagyl, Cipro) Amiodarone (Cordarone) Aspirin (ASA) Cimetidine (Tagamet) Megestrol (Megace) NSAIDs (ibuprofen, naproxen, etc.) Piroxicam (Feldene) Propafenone (Rythmol SR) Propranolol (Inderal) Isoniazid (INH) Posaconazole (Noxafil) Barbiturates (Phenobarbital) Carbamazepine (Tegretol) Chlordiazepoxide (Librium) Cholestyramine (Questran) Griseofulvin Oral Contraceptives Rifampin Sucralfate (Carafate) Vitamin K   Coumadin (Warfarin) Major Herbal Interactions  Increased Warfarin Effect Decreased Warfarin Effect  Garlic Ginseng Ginkgo biloba Coenzyme Q10 Green tea St. Johns wort    Coumadin (Warfarin) FOOD Interactions  Eat a consistent number of servings per week of foods HIGH in Vitamin K (1 serving =  cup)  Collards (cooked, or boiled & drained) Kale (cooked, or boiled & drained) Mustard greens (cooked, or boiled & drained) Parsley *serving size only =  cup Spinach (cooked, or boiled & drained) Swiss chard (cooked, or boiled & drained) Turnip greens (cooked, or boiled & drained)  Eat a consistent number of servings per week of foods MEDIUM-HIGH in Vitamin K (1 serving = 1 cup)  Asparagus (cooked, or boiled & drained) Broccoli (cooked, boiled & drained, or raw & chopped) Brussel sprouts (cooked, or boiled & drained) *serving size only =  cup Lettuce, raw (green leaf, endive, romaine) Spinach, raw Turnip greens, raw & chopped   These websites have more information on Coumadin (warfarin):   http://www.king-russell.com/; https://www.hines.net/;

## 2016-12-09 NOTE — Progress Notes (Signed)
Speech Language Pathology Daily Session Note  Patient Details  Name: [name] MRN: [id number] Date of Birth: 1926/12/05  Today's Date: 12/09/2016 SLP Individual Time: 0900-1001 SLP Individual Time Calculation (min): 61 min  Short Term Goals: Week 1: SLP Short Term Goal 1 (Week 1): Patient will consume current diet with minimal overt s/s of aspiration and Max A verbal cues for use of swallowing compensatory strategies.  SLP Short Term Goal 2 (Week 1): Patient will demonstrate efficient mastication and complete oral clearance without overt s/s of aspiration with trials of Dys. 2 textures over 2 sessiosn prior to upgrade.  SLP Short Term Goal 3 (Week 1): Patient will imitate vowels in 25% of opportunities with Max A multimodal cues.  SLP Short Term Goal 4 (Week 1): Patient will answer basic yes/no questions via multimodal communication with 25% accuracy with Max A multimodal cues.  SLP Short Term Goal 5 (Week 1): Patient will identify functional objects with 25% accuracy and Max A multimodal cues.  SLP Short Term Goal 6 (Week 1): Patient will demonstrate sustained attention to tasks for 10 minutes with Max A verbal cues for redirection.   Skilled Therapeutic Interventions: Skilled ST services focused on cognitive and swallow skills. SLP communicated with daughter about pt prior cognitive and PO consumption ability, daughter stated that he had impairment in comprehension and expression, however language confusion and use of nonsense words has exacerbated. Daughter translated for SLP during basic functional tasks, moving in bed, transferring to toilet, PO consumption requiring Max A multimodal cues. Pt demonstrated 50% PO consumption of Dys 1 and thin liquids requiring Mod-Max assist hand over hand and verbal cues to encourage continuing consumption. Pt demonstrated cough with and without Po intake and grimace during Po consumption, possible due to edema from intubation. SLP cleared daughter to  bring in pureed textured foods from home and daughter provided same textured foods at home piror to hospitalization. Pt left with nursing staff in room. Continue ST services.      Function:  Eating Eating   Modified Consistency Diet: Yes Eating Assist Level: Supervision or verbal cues;Helper scoops food on utensil;Hand over hand assist     Helper Scoops Food on Utensil: Every scoop Helper Brings Food to Mouth: Occasionally   Cognition Comprehension Comprehension assist level: Understands basic less than 25% of the time/ requires cueing >75% of the time  Expression   Expression assist level: Expresses basis less than 25% of the time/requires cueing >75% of the time.  Social Interaction Social Interaction assist level: Interacts appropriately less than 25% of the time. May be withdrawn or combative.  Problem Solving Problem solving assist level: Solves basic less than 25% of the time - needs direction nearly all the time or does not effectively solve problems and may need a restraint for safety  Memory Memory assist level: Recognizes or recalls less than 25% of the time/requires cueing greater than 75% of the time    Pain Pain Assessment Pain Assessment: No/denies pain  Therapy/Group: Individual Therapy  [name]  [facility] 12/09/2016, 4:23 PM

## 2016-12-09 NOTE — Progress Notes (Signed)
Occupational Therapy Session Note  Patient Details  Name: [name] MRN: [id number] Date of Birth: February 16, 1926  Today's Date: 12/09/2016 OT Individual Time: 1100-1155 OT Individual Time Calculation (min): 55 min    Short Term Goals: Week 1:  OT Short Term Goal 1 (Week 1): Pt will follow single step commands during dressing tasks. OT Short Term Goal 2 (Week 1): Pt will notify staff of need to toilet. OT Short Term Goal 3 (Week 1): Pt will complete stand pivot toilet transfer with CGA. OT Short Term Goal 4 (Week 1): Pt will visually identify necessary grooming items to complete grooming routine.  Skilled Therapeutic Interventions/Progress Updates:    Pt resting in bed with daughter present.  Focus on following one step commands, functional amb with Rollator, and BLE therex to increase endurance and ability to complete BADLs.  Pt continues to decline bathing/dressing tasks.  Pt continues to require max verbal and demonstration cues to initiate basic functional tasks.  Pt amb with Rollator from room to day room and engaged in BLE on Nustep (level 2 for 7 mins).  Pt returned to room and transferred to bed with daughter present.   Therapy Documentation Precautions:  Precautions Precautions: Fall Restrictions Weight Bearing Restrictions: No Pain:  Pt moaning but unable to specify localized pain  See Function Navigator for Current Functional Status.   Therapy/Group: Individual Therapy  Rich Brave 12/09/2016, 12:18 PM

## 2016-12-10 ENCOUNTER — Inpatient Hospital Stay (HOSPITAL_COMMUNITY): Payer: Medicare Other

## 2016-12-10 ENCOUNTER — Inpatient Hospital Stay (HOSPITAL_COMMUNITY): Payer: Medicare Other | Admitting: Physical Therapy

## 2016-12-10 ENCOUNTER — Inpatient Hospital Stay (HOSPITAL_COMMUNITY): Payer: Medicare Other | Admitting: Speech Pathology

## 2016-12-10 DIAGNOSIS — I509 Heart failure, unspecified: Secondary | ICD-10-CM

## 2016-12-10 LAB — PROTIME-INR
INR: 2.96
PROTHROMBIN TIME: 30.6 s — AB (ref 11.4–15.2)

## 2016-12-10 LAB — GLUCOSE, CAPILLARY
Glucose-Capillary: 235 mg/dL — ABNORMAL HIGH (ref 65–99)
Glucose-Capillary: 98 mg/dL (ref 65–99)
Glucose-Capillary: 213 mg/dL — ABNORMAL HIGH (ref 65–99)
Glucose-Capillary: 270 mg/dL — ABNORMAL HIGH (ref 65–99)
Glucose-Capillary: 82 mg/dL (ref 65–99)

## 2016-12-10 MED ORDER — WARFARIN SODIUM 2 MG PO TABS
2.0000 mg | ORAL_TABLET | Freq: Once | ORAL | Status: AC
  Administered 2016-12-10: 2 mg via ORAL
  Filled 2016-12-10: qty 1

## 2016-12-10 MED ORDER — PREDNISONE 20 MG PO TABS
20.0000 mg | ORAL_TABLET | Freq: Every day | ORAL | Status: DC
  Administered 2016-12-11: 20 mg via ORAL
  Filled 2016-12-10: qty 1

## 2016-12-10 NOTE — Patient Care Conference (Signed)
Inpatient RehabilitationTeam Conference and Plan of Care Update Date: 12/09/2016   Time: 2:20 PM    Patient Name: [name]      Medical Record Number: [id number]  Date of Birth: 07/09/1926 Sex: Male         Room/Bed: 4W03C/4W03C-01 Payor Info: Payor: [employer] MEDICARE / Plan: [employer] MEDICARE / Product Type: *No Product type* /    Admitting Diagnosis: L CVA  Admit Date/Time:  12/03/2016  3:54 PM Admission Comments: No comment available   Primary Diagnosis:  <principal problem not specified> Principal Problem: <principal problem not specified>  Patient Active Problem List   Diagnosis Date Noted  . 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 Cerebral infarction due to embolism of cerebral artery (HCC)   . Chronic atrial fibrillation (HCC)   . History of CVA (cerebrovascular accident)   . Dysphagia, post-stroke   . Chronic diastolic congestive heart failure (HCC)   . Uncomplicated asthma   . History of gout   . Persistent atrial fibrillation (HCC)   . Aphasia   . HCAP (healthcare-associated pneumonia)   . SOB (shortness of breath)   . PAF (paroxysmal atrial fibrillation) (HCC)   . Diabetes mellitus type 2 in nonobese (HCC)   . Stage 3 chronic kidney disease (HCC)   . Tachypnea   . Benign essential HTN   . Hypokalemia   . Acute blood loss anemia   . Cerebral thrombosis with cerebral infarction 11/27/2016  . Stroke (cerebrum) (HCC) 11/27/2016  . Acute hypoxemic respiratory failure (HCC)   . Stenosis of left carotid artery   . Acute ischemic stroke (HCC) 10/18/2016  . Cerebral infarction due to stenosis of right middle cerebral artery (HCC) 10/10/2016  . CVA (cerebral vascular accident) (HCC) 07/18/2016  . Stroke-like symptoms 07/18/2016  . Stroke (HCC) 07/18/2016  . Anemia 07/18/2016  . Non-English speaking patient 07/18/2016  . COPD, group B, by GOLD 2017 classification (HCC)   . Nausea, vomiting and diarrhea 10/03/2015  . CKD (chronic kidney  disease), stage III (HCC) 07/27/2015  . Carotid artery disease (HCC) 07/27/2015  . Hyponatremia 06/18/2014  . Paroxysmal atrial fibrillation (HCC) 11/16/2013  . Ischemic cardiomyopathy 11/16/2013  . Headache 11/16/2013  . Chronic anticoagulation 11/16/2013  . Neck pain on left side 11/16/2013  . Chest pain at rest 11/16/2013  . Coronary artery disease 10/14/2013  . Chronic systolic heart failure (HCC) 10/14/2013  . AAA (abdominal aortic aneurysm) without rupture (HCC) 09/08/2013  . Hyperlipidemia 06/08/2013  . History of stroke 05/02/2013  . PVD (peripheral vascular disease) 3.5cm AAA Aug 2014 04/29/2013  . NSTEMI - ? type 2 - Troponin 0.63 04/28/2013  . Chronic bilateral lower abdominal pain 02/17/2013  . Hypertension     Expected Discharge Date: Expected Discharge Date: 12/11/16  Team Members Present: Physician leading conference: Dr. Faith RogueZachary Swartz Social Worker Present: Amada JupiterLucy Gwendloyn Forsee, LCSW Nurse Present: Kennon PortelaJeanna Hicks, RN PT Present: Alyson ReedyElizabeth Tygielski, PT OT Present: Roney MansJennifer Smith, OT;Ardis Rowanom Lanier, COTA SLP Present: Colin BentonMadison Cratch, SLP PPS Coordinator present : Tora DuckMarie Noel, RN, [facility]CRRN     Current Status/Progress Goal Weekly Team Focus  Medical   left MCA infarct. ?mild fluid overload. depressions/cultural issues prevalent  improve communication and activity tolerance  volume mgt, nutrition   Bowel/Bladder   Continent of bowel & bladder, LBM 12/09/16  continue continence  continue to monitor & assist as needed   Swallow/Nutrition/ Hydration   Dys 1 and thin, Mod-max cues to encourage eating and assist with eating  Max A  swallow strategies, trials of dys 2   ADL's   mod A bathing/dressing (per report); min A for tub and toilet tranfsers  min A/supervision overall  family education, standing balance, BADL retraining, functional transfers, activity tolerance   Mobility   min guard/S overall  S bed mobility, minA overall with rollator  activity tolerance, balance, safety  awareness, family education   Communication   daughter states slurred speech and nonsense words  Max A  translation from family member, functional objects, yes/no with visual aid   Safety/Cognition/ Behavioral Observations  baseline comprehenison and expression impairments, exacerbated current CVA,   Max A  functional problem solving, 1 step directions, attention, initattion   Pain   no c/o pain  pain scale <3  assess & treat as needed   Skin   multiple bruises to BLE & BUE  no new areas of skin break down  assess q shift    Rehab Goals Patient on target to meet rehab goals: Yes *See Care Plan and progress notes for long and short-term goals.     Barriers to Discharge  Current Status/Progress Possible Resolutions Date Resolved   Physician    Medical stability        ongoing medical mgt, family ed      Nursing                  PT                    OT                  SLP                SW                Discharge Planning/Teaching Needs:  Home with family providing 24/7 assistance  Teaching being completed   Team Discussion:  Complicated picture:  Medical vs behavior vs cultural?  RN and PA meeting with family today with interpreter to review any medical questions.  Per daughter, pt overall is close to baseline.  Toilet and tub tfs with steady assist.  Expressive aphasia but can usually follow directions with basic functional tasks.    Revisions to Treatment Plan:  None    Continued Need for Acute Rehabilitation Level of Care: The patient requires daily medical management by a physician with specialized training in physical medicine and rehabilitation for the following conditions: Daily direction of a multidisciplinary physical rehabilitation program to ensure safe treatment while eliciting the highest outcome that is of practical value to the patient.: Yes Daily medical management of patient stability for increased activity during participation in an intensive  rehabilitation regime.: Yes Daily analysis of laboratory values and/or radiology reports with any subsequent need for medication adjustment of medical intervention for : Neurological problems  Meril Dray 12/10/2016, 1:39 PM

## 2016-12-10 NOTE — Progress Notes (Signed)
Physical Therapy Discharge Summary  Patient Details  Name: [name] MRN: [id number] Date of Birth: August 22, 1926  Today's Date: 12/10/2016 PT Individual Time: 0800-0900 PT Individual Time Calculation (min): 60 min    Patient has met 9 of 9 long term goals due to improved activity tolerance, improved balance and increased strength.  Patient to discharge at an ambulatory level Supervision.   Patient's care partner is independent to provide the necessary physical and cognitive assistance at discharge.  Reasons goals not met: All goals met  Recommendation:  Patient will benefit from ongoing skilled PT services in home health setting to continue to advance safe functional mobility, address ongoing impairments in strength, balance, coordination, ROM, activity tolerance, and minimize fall risk.  Equipment: No equipment provided  Reasons for discharge: treatment goals met and discharge from hospital  Patient/family agrees with progress made and goals achieved: Yes  PT Discharge Precautions/Restrictions   Vital Signs Therapy Vitals Temp: 98.1 F (36.7 C) Temp Source: Oral Pulse Rate: 67 Resp: 18 BP: (!) 152/87 Patient Position (if appropriate): Lying Oxygen Therapy SpO2: 98 % O2 Device: Not Delivered Pain  No evidence of pain Vision/Perception  Vision - Assessment Additional Comments: Unable to formally assess secondary to asphasia and pt unable to follow commands Perception Perception: Impaired Inattention/Neglect: Does not attend to right side of body Praxis Praxis: Impaired Praxis Impairment Details: Ideomotor;Initiation;Motor planning;Ideation  Cognition Overall Cognitive Status: Impaired/Different from baseline Arousal/Alertness: Awake/alert Orientation Level: Oriented to person;Oriented to situation Attention: Focused;Sustained Focused Attention: Impaired Focused Attention Impairment: Verbal basic;Functional basic Sustained Attention: Impaired Sustained  Attention Impairment: Verbal basic;Functional basic Awareness: Impaired Awareness Impairment: Intellectual impairment Problem Solving: Impaired Problem Solving Impairment: Functional basic;Verbal basic Behaviors: Restless;Perseveration Safety/Judgment: Impaired Sensation Sensation Light Touch: Not tested Stereognosis: Not tested Hot/Cold: Not tested Proprioception: Not tested Additional Comments: Unable to assess d/t aphasia and inability to follow single step commands Coordination Gross Motor Movements are Fluid and Coordinated: No Fine Motor Movements are Fluid and Coordinated: No Finger Nose Finger Test: Unable to assess Motor  Motor Motor: Motor apraxia;Abnormal postural alignment and control Motor - Discharge Observations: generalized weakness, apraxia  Mobility Bed Mobility Bed Mobility: Supine to Sit;Sit to Supine Supine to Sit: 5: Supervision Sit to Supine: 5: Supervision Transfers Transfers: Yes Sit to Stand: 5: Supervision Sit to Stand Details: Visual cues/gestures for sequencing Sit to Stand Details (indicate cue type and reason): visual cues for initiation Stand Pivot Transfers: 5: Supervision Stand Pivot Transfer Details: Visual cues/gestures for sequencing Locomotion  Ambulation Ambulation: Yes Ambulation/Gait Assistance: 5: Supervision Ambulation Distance (Feet): 150 Feet Assistive device: Rollator Ambulation/Gait Assistance Details: Verbal cues for precautions/safety;Visual cues/gestures for sequencing;Visual cues/gestures for precautions/safety Gait Gait: Yes Gait Pattern: Impaired Gait Pattern: Left flexed knee in stance;Right flexed knee in stance;Poor foot clearance - left;Poor foot clearance - right;Step-through pattern;Decreased stride length Stairs / Additional Locomotion Stairs: Yes Stairs Assistance: 5: Supervision Stair Management Technique: Alternating pattern;Step to pattern;Forwards Number of Stairs: 12 Height of Stairs: 3 Ramp: 5:  Supervision Wheelchair Mobility Wheelchair Mobility: No  Trunk/Postural Assessment  Cervical Assessment Cervical Assessment: Exceptions to [facility] (forward head) Thoracic Assessment Thoracic Assessment: Exceptions to [facility] (kyphotic) Lumbar Assessment Lumbar Assessment: Within Functional Limits Postural Control Postural Control: Deficits on evaluation Righting Reactions: delayed Protective Responses: delayed  Balance Balance Balance Assessed: Yes Static Sitting Balance Static Sitting - Balance Support: No upper extremity supported Static Sitting - Level of Assistance: 6: Modified independent (Device/Increase time) Dynamic Sitting Balance Dynamic Sitting - Balance Support: Feet supported Dynamic Sitting -  Level of Assistance: 6: Modified independent (Device/Increase time) Static Standing Balance Static Standing - Balance Support: During functional activity;Left upper extremity supported;Right upper extremity supported Static Standing - Level of Assistance: 5: Stand by assistance Dynamic Standing Balance Dynamic Standing - Balance Support: During functional activity;Right upper extremity supported;Left upper extremity supported Dynamic Standing - Level of Assistance: 5: Stand by assistance Extremity Assessment  RUE Assessment RUE Assessment: Within Functional Limits LUE Assessment LUE Assessment: Within Functional Limits RLE Assessment RLE Assessment: Exceptions to [facility] RLE AROM (degrees) RLE Overall AROM Comments: WFL RLE Strength RLE Overall Strength Comments: Difficult to formally assess; grossly 4-/5 to 4/5 throughout LLE Assessment LLE Assessment: Exceptions to WFL LLE AROM (degrees) LLE Overall AROM Comments: WFL LLE Strength LLE Overall Strength Comments: difficult to formally assess; grossly 4-/5 to 4/5 throughout  Skilled Therapeutic Intervention: Pt received supine in bed, no evidence of pain and agreeable to treatment. Bed mobility performed with S and multimodal cues  for initiation. Stand pivot transfer to w/c with S. Pt engaged in self feeding with pt's daughter providing cues and occasional assist d/t poor attention, pt holding silverware incorrectly. Assessed mobility as described above with S/min guard overall with rollator. Pt with urinary urgency; used hall restroom with daughter providing minA. Returned to room and remained seated in w/c at end of session, all needs in reach.   See Function Navigator for Current Functional Status.  Benjiman Core Tygielski 12/10/2016, 9:01 AM

## 2016-12-10 NOTE — Progress Notes (Signed)
Social Work Patient ID: [name], male   DOB: 04/07/1926, 83 y.o.   MRN: [id number]   Met with pt's daughter following team conference yesterday. She is aware of targeted d/c date of 11/1 but she questions if he will be ready.  Explained that team feels this is a reasonable discharge target as family is fully able to provide 24/7.  Daughter expressing concern about pt's throat being sore but is unable to elaborate.  RN reports that this concern was addressed with her earlier in the day.  Will proceed with plan for d/c on Thursday.  Lauri Till, LCSW

## 2016-12-10 NOTE — Progress Notes (Signed)
Physical Therapy Note  Patient Details  Name: [name] MRN: [id number] Date of Birth: [birth year] Today's Date: 12/10/2016  9311-2162, 50 min individual tx Pain: none communicated by pt to family  Family ed with dtr regarding locking pt's brakes of RW during sit>< stand; up/down (2) low steps to enter home without rails, min hand hold assist. Also, PT suggested dtr use verbal as well as gesture cues to communicate with pt for mobility.   Gait on level tile with rollator RW , supervision, on mulched surface with min assist.  Gait up/down 12 steps 2 rails with min assist; pt used self -selected step- to technique leading up with R and down with LLE.  No buckling noted.    Pt left sitting EOB with family in attendance.  See function navigator for current status.  Thaddaeus Granja 12/10/2016, 7:58 AM

## 2016-12-10 NOTE — Progress Notes (Signed)
Speech Language Pathology Discharge Summary  Patient Details  Name: [name] MRN: [id number] Date of Birth: [birth year]  Today's Date: 12/10/2016 SLP Individual Time: 70-1430 SLP Individual Time Calculation (min): 45 min   Skilled Therapeutic Interventions:  Skilled treatment focused on caregiver education with pt's family. No interpreter present and young man present who spoke [place]. All information given to him regarding need for puree.   Patient has met 6 of 6 long term goals.  Patient to discharge at overall Max level.   Clinical Impression/Discharge Summary:   Pt has made minimal progress during skilled ST sessions and as a result he requires Max support at discharge. Pt is discharging on puree with thin liquids with increased risk for aspiration and needs to follow strict aspiration precautions. All education completed.   Care Partner:  Caregiver Able to Provide Assistance: Yes  Type of Caregiver Assistance: Physical;Cognitive  Recommendation:  24 hour supervision/assistance;Home Health SLP  Rationale for SLP Follow Up: Maximize functional communication;Maximize cognitive function and independence;Maximize swallowing safety;Reduce caregiver burden   Equipment: None recommended   Reasons for discharge: Discharged from hospital;Treatment goals met   Patient/Family Agrees with Progress Made and Goals Achieved: Yes   Function:  Eating Eating   Modified Consistency Diet: Yes Eating Assist Level: Supervision or verbal cues;More than reasonable amount of time           Cognition Comprehension Comprehension assist level: Understands basic less than 25% of the time/ requires cueing >75% of the time  Expression   Expression assist level: Expresses basis less than 25% of the time/requires cueing >75% of the time.  Social Interaction Social Interaction assist level: Interacts appropriately less than 25% of the time. May be withdrawn or combative.  Problem Solving  Problem solving assist level: Solves basic less than 25% of the time - needs direction nearly all the time or does not effectively solve problems and may need a restraint for safety  Memory Memory assist level: Recognizes or recalls less than 25% of the time/requires cueing greater than 75% of the time   Alastor Kneale 12/10/2016, 2:49 PM

## 2016-12-10 NOTE — Discharge Summary (Signed)
Discharge summary job [id number]

## 2016-12-10 NOTE — Progress Notes (Signed)
ANTICOAGULATION CONSULT NOTE - Initial Consult  Pharmacy Consult for Warfarin Indication: stroke  Allergies  Allergen Reactions  . Levofloxacin Anaphylaxis and Other (See Comments)    Patient told his daughter that it made him "feel worse than he already did" (overall) Headache also (has refused to take anymore)  . Eliquis [Apixaban] Itching and Swelling  . Phenazopyridine Hcl Other (See Comments)    Unknown reaction  . Septra [Sulfamethoxazole-Trimethoprim] Nausea And Vomiting and Other (See Comments)    GI Upset  . Latex Rash  . Tape Rash    Patient Measurements: Height: 5\' 5"  (165.1 cm) Weight: 119 lb 5.1 oz (54.1 kg) IBW/kg (Calculated) : 61.5  Vital Signs: Temp: 98.1 F (36.7 C) (10/31 0537) Temp Source: Oral (10/31 0537) BP: 152/87 (10/31 0815) Pulse Rate: 67 (10/31 0815)  Labs:  Recent Labs  12/08/16 1218 12/09/16 0502 12/10/16 0806  HGB 10.3*  --   --   HCT 32.1*  --   --   PLT 214  --   --   LABPROT 25.9* 29.0* 30.6*  INR 2.39 2.77 2.96  CREATININE 1.60*  --   --     Estimated Creatinine Clearance: 24.4 mL/min (A) (by C-G formula based on SCr of 1.6 mg/dL (H)).  Assessment: 89 yoM on xarelto pta for afib, with hx of multiple strokes, anticoagulation was originally  on hold d/t small hemorrhagic conversion. Started warfarin 10/27, INR 2.96 Hgb 10.3 stable, pltc wnl.   Goal of Therapy:  INR 2.5-3.5 (per neuro) Monitor platelets by anticoagulation protocol: Yes   Plan:  Warfarin 2mg  x1, likely requires 2mg  daily Daily INR Monitor for s/sx of bleeding or thrombosis Plan for discharge tomorrow  Bayard Hugger, PharmD, BCPS  Clinical Pharmacist  Pager: [phone]   12/10/2016,1:57 PM

## 2016-12-10 NOTE — Discharge Summary (Signed)
NAME:  [name], [name]               ACCOUNT NO.:  MEDICAL RECORD NO.:  [id number]  LOCATION:                                 FACILITY:  PHYSICIAN:  Ranelle OysterZachary T. Swartz, M.D.DATE OF BIRTH:  09/07/1926  DATE OF ADMISSION:  12/03/2016 DATE OF DISCHARGE:  12/11/2016                              DISCHARGE SUMMARY   DISCHARGE DIAGNOSES: 1. Left middle cerebral artery infarction, embolic, secondary to     proximal M2 occlusion, status post thrombectomy. 2. Chronic Coumadin therapy with goal INR of 2.5-3.5. 3. Dysphagia. 4. Asthma 5. Pneumonia, resolved. 6. Atrial fibrillation. 7. Chronic kidney disease, stage III. 8. Diastolic congestive heart failure. 9. Hypertension. 10. Diabetes mellitus. 11.MRSA PCR screening positive. 12.Hyperlipidemia. 13.Gout. 14. Hyponatremia. Stable 15. Hypokalemia 16.BPH 17. Leukocytosis. Felt to be steroid-induced  HISTORY OF PRESENT ILLNESS:  This is an 83 year old right-handed, non- English-speaking male, with history of multiple strokes most recently, October 21, 2016, as well as atrial fibrillation, maintained on Xarelto; diabetes mellitus; CKD, stage III; diastolic congestive heart failure.  The patient lives with spouse.  Plans to return to his daughter's home on discharge.  Presented November 27, 2016, with acute onset of aphasia and right-sided weakness.  CT of the head showed frontotemporal lobe infarction.  CT-MRI, acute lateral left frontal lobe infarction.  Extensive subacute and chronic ischemia including a large subacute left temporal lobe infarction, new as compared to October 28, 2016.  Small volume subarachnoid hemorrhage in the left perimesencephalic cistern.  EEG; mild-to-moderate generalized background slowing, no seizure.  Recent echocardiogram with ejection fraction of 55%, no wall motion abnormalities.  Left common carotid arteriogram showed occluded left MCA, underwent revascularization per Interventional Radiology.   The patient did need ventilatory support for short time. Coumadin was initiated, December 06, 2016, Xarelto had been discontinued, goal INR of 2.5-3.5.  Chest x-ray, November 28, 2016, showed persistent right lower lobe airspace disease concerning for pneumonia, maintained on broad-spectrum antibiotics.  Dysphagia #1 thin liquid diet.  Physical and occupational therapy ongoing.  The patient was admitted for comprehensive rehab program.  PAST MEDICAL HISTORY:  See discharge diagnoses.  SOCIAL HISTORY:  Lives with spouse, good support of family, used a single-point cane and standard walker prior to admission.  FUNCTIONAL STATUS:  Upon admission to Rehab services was +2 physical assist sit to stand; total assist sit to supine; max total assist, activities of daily living.  PHYSICAL EXAMINATION:  VITAL SIGNS:  Blood pressure 154/89, pulse 87, temperature 98.3, respirations 19. GENERAL:  This was an alert male, alert, unable to assess due to language barrier.  Spontaneously moved all extremities. HEENT:  EOMS intact. NECK:  Supple.  Nontender.  No JVD. CARDIAC:  Rate controlled. ABDOMEN:  Soft, nontender.  Good bowel sounds. LUNGS:  Clear to auscultation without wheeze.  REHABILITATION HOSPITAL COURSE:  The patient was admitted to Inpatient Rehab Services with therapies initiated on a 3-hour daily basis consisting of physical therapy, occupational therapy, speech therapy and rehabilitation nursing.  The following issues were addressed during the patient's rehabilitation stay.  Pertaining to [name] left MCA infarction, proximal M2 occlusion and undergone thrombectomy revascularization, he would follow up with Neurology Services.  The patient  now maintained on chronic Coumadin therapy with a goal INR of 2.5-3.5.  No bleeding episodes.  He would follow up Dr. Benedetto Goad, [phone]; fax [phone].  He was tolerating a dysphagia #1 thin liquid diet. Atrial fibrillation.  Cardiac  rate controlled.  Advised to continue Coumadin therapy.  He exhibits no signs of fluid overload and low dose lasix was discontinued appeared mildly euvolemic.  Blood pressure was controlled. He completed course of antibiotic therapy for pneumonia.  Followup chest x-ray, no active disease.  He was tapered from prednisone. Mild leukocytosis felt to be steroid-induced. Patient remained afebrile. He would continue his nebulizers prior to admission for asthma.  CKD, stage III.  Chemistries remained stable with latest creatinine of 1.60. Diabetes mellitus, peripheral neuropathy.  Hemoglobin A1c of 6, close monitoring while taper of prednisone.  Contact precautions for MRSA PCR screening positive.  Noted some urinary frequency with history BPH and urine study negative.Flomax was resumed as prior to admission.  Family had noted nocturia.  Followup urine study, negative nitrite.  He did have some complaints of abdominal discomfort.  Abdominal film showed no significant findings.  All issues in regard to medical condition discussed with family through a translator.  The patient received weekly collaborative interdisciplinary team conferences to discuss estimated length of stay, family teaching, any barriers to discharge.  He was ambulating 150 feet from therapy to the day room, needing minimal assist to avoid obstacles.  Working with standing balance.  Navigated stairs with close supervision.  He could increase his ambulation up to 175 feet with assistive device.  Activities of daily living and homemaking. Focused on following simple commands, endurance and ability to complete basic hygiene, bathing-dressing task, needed some verbal cues with assistance by his family.  He will continue currently on a dysphagia #1 thin liquid diet with all restrictions discussed with family.  DISCHARGE MEDICATIONS:  Included: 1. Allopurinol 300 mg p.o. daily. 2. Lipitor 40 mg p.o. daily. 3. Coreg 6.25 mg p.o.  b.i.d. 6. Protonix 40 mg p.o. daily. 7. Prednisone taper as directed. 8. Florastor 250 mg p.o. B.i.d. 9. Flomax 0.4 mg daily 10. Brovana nebulizer twice daily. As prior to admission 11. Pulmicort nebulizer twice daily. As prior to admission 12.Coumadin adjusted accordingly for an INR of 2.5-3.5, with latest     dose of 2 mg.  DIET:  His diet was a dysphagia #1 thin liquid diet.  FOLLOWUP:  The patient would follow up with Dr. Faith Rogue at the Outpatient Rehab Service Office as directed; Dr. Roda Shutters, Neurology Services, call for appointment; Dr. Benedetto Goad, Medical Management.  SPECIAL INSTRUCTIONS:  Home health nurse to check INR on December 15, 2016, results to Dr. Benedetto Goad, [phone], fax [phone].     Mariam Dollar, P.A.   ______________________________ Ranelle Oyster, M.D.    DA/MEDQ  D:  12/10/2016  T:  12/10/2016  Job:  [id number]  cc:   Marvel Plan, MD [name]. Andrey Campanile, M.D.

## 2016-12-10 NOTE — Progress Notes (Signed)
Occupational Therapy Discharge Summary  Patient Details  Name: [name] MRN: [id number] Date of Birth: Oct 15, 1926   Patient has met 9 of 9 long term goals due to improved activity tolerance, improved balance, postural control and ability to compensate for deficits.  Pt progress has been steady during this admission but continues to require max verbal and demonstration cues secondary to aphasia, R inattention, and questionable apraxia. Pt requires min A for BADLs.  [name] and other family members have been present and participated in therapy.  [name] states that pt required assistance prior to this stroke and some days were "worse" than others.Patient to discharge at [facility] Assist level.  Patient's care partner is independent to provide the necessary physical and cognitive assistance at discharge.    Recommendation:  Patient will benefit from ongoing skilled OT services in home health setting to continue to advance functional skills in the area of BADL and Reduce care partner burden.  Equipment: Pt owns tub seat  Reasons for discharge: treatment goals met and discharge from hospital  Patient/family agrees with progress made and goals achieved: Yes  OT Discharge   Vision Baseline Vision/History: No visual deficits (per family report) Patient Visual Report: No change from baseline (per family report) Additional Comments: Unable to formally assess secondary to asphasia and pt unable to follow commands Perception  Perception: Impaired Inattention/Neglect: Does not attend to right side of body Praxis Praxis: Impaired Praxis Impairment Details: Ideomotor;Initiation;Motor planning;Ideation Cognition Overall Cognitive Status: Impaired/Different from baseline Arousal/Alertness: Awake/alert Orientation Level: Oriented to person;Oriented to situation Attention: Focused;Sustained Focused Attention: Impaired Focused Attention Impairment: Verbal basic;Functional  basic Sustained Attention: Impaired Sustained Attention Impairment: Verbal basic;Functional basic Awareness: Impaired Awareness Impairment: Intellectual impairment Problem Solving: Impaired Problem Solving Impairment: Functional basic;Verbal basic Behaviors: Restless;Perseveration Safety/Judgment: Impaired Sensation Sensation Light Touch: Not tested Stereognosis: Not tested Hot/Cold: Not tested Proprioception: Not tested Additional Comments: Unable to assess d/t aphasia and inability to follow single step commands Coordination Gross Motor Movements are Fluid and Coordinated: No Fine Motor Movements are Fluid and Coordinated: No Finger Nose Finger Test: Unable to assess Motor  Motor Motor: Motor apraxia;Abnormal postural alignment and control Motor - Discharge Observations: generalized weakness, apraxia Trunk/Postural Assessment  Cervical Assessment Cervical Assessment: Exceptions to [facility] (forward head) Thoracic Assessment Thoracic Assessment: Exceptions to [facility] (kyphotic) Lumbar Assessment Lumbar Assessment: Within Functional Limits Postural Control Postural Control: Deficits on evaluation Righting Reactions: delayed Protective Responses: delayed  Balance Static Sitting Balance Static Sitting - Balance Support: No upper extremity supported Static Sitting - Level of Assistance: 6: Modified independent (Device/Increase time) Dynamic Sitting Balance Dynamic Sitting - Balance Support: Feet supported Dynamic Sitting - Level of Assistance: 6: Modified independent (Device/Increase time) Extremity/Trunk Assessment RUE Assessment RUE Assessment: Within Functional Limits LUE Assessment LUE Assessment: Within Functional Limits   See Function Navigator for Current Functional Status.  Leotis Shames [facility] 12/10/2016, 3:04 PM

## 2016-12-10 NOTE — Progress Notes (Signed)
[place] PHYSICAL MEDICINE & REHABILITATION     PROGRESS NOTE    Subjective/Complaints: No new problems. Still impulsive. No complaints today  ROS: Limited due to cognitive/behavioral   Objective: Vital Signs: Blood pressure (!) 152/87, pulse 67, temperature 98.1 F (36.7 C), temperature source Oral, resp. rate 18, height 5\' 5"  (1.651 m), weight 54.1 kg (119 lb 5.1 oz), SpO2 98 %. Dg Abd 1 View  Result Date: 12/08/2016 CLINICAL DATA:  Cerebral infarction and diarrhea. EXAM: ABDOMEN - 1 VIEW COMPARISON:  CT of the abdomen and pelvis on 07/31/2015 FINDINGS: No evidence of significant bowel obstruction or ileus. No abnormal calcifications. Diffuse degenerative disease noted of the lumbar spine. IMPRESSION: No significant findings. Electronically Signed   By: Irish Lack M.D.   On: 12/08/2016 09:31    Recent Labs  12/08/16 1218  WBC 16.6*  HGB 10.3*  HCT 32.1*  PLT 214    Recent Labs  12/08/16 1218  NA 134*  K 3.2*  CL 95*  GLUCOSE 177*  BUN 18  CREATININE 1.60*  CALCIUM 9.0   CBG (last 3)   Recent Labs  12/09/16 1737 12/09/16 2047 12/10/16 0636  GLUCAP 261* 226* 98    Wt Readings from Last 3 Encounters:  12/10/16 54.1 kg (119 lb 5.1 oz)  11/27/16 63.9 kg (140 lb 14 oz)  11/05/16 64.9 kg (143 lb)    Physical Exam:  Constitutional: He appears well-developed.   Frail appearing HENT:  Head: Normocephalic and atraumatic.  Eyes: EOM are normal. Right eye exhibits no discharge. Left eye exhibits no discharge.  Neck: Normal range of motion. Neck supple. No thyromegaly present.  Cardiovascular:   IRR IRR Respiratory: CTA Bilaterally without wheezes or rales. Normal effort  GI: BS+, belly soft, NT   Musculoskeletal: He exhibits no edema or tenderness.  Neurological: He is alert.      Moves all 4 limbs during functional activities.   Skin: Skin is warm and dry.  Psychiatric:  Unable to assess due to language, flat. Follows some  commands  Assessment/Plan: 1. Right hemiparesis and functional deficits secondary to left MCA infarct which require 3+ hours per day of interdisciplinary therapy in a comprehensive inpatient rehab setting. Physiatrist is providing close team supervision and 24 hour management of active medical problems listed below. Physiatrist and rehab team continue to assess barriers to discharge/monitor patient progress toward functional and medical goals.  Function:  Bathing Bathing position Bathing activity did not occur: Refused    Bathing parts      Bathing assist        Upper Body Dressing/Undressing Upper body dressing Upper body dressing/undressing activity did not occur: Refused What is the patient wearing?: Pull over shirt/dress       Pull over shirt/dress - Perfomed by helper: Thread/unthread right sleeve, Thread/unthread left sleeve, Put head through opening, Pull shirt over trunk        Upper body assist Assist Level: Touching or steadying assistance(Pt > 75%)      Lower Body Dressing/Undressing Lower body dressing Lower body dressing/undressing activity did not occur: Refused What is the patient wearing?: Pants, Non-skid slipper socks       Pants- Performed by helper: Thread/unthread right pants leg, Thread/unthread left pants leg, Pull pants up/down, Fasten/unfasten pants   Non-skid slipper socks- Performed by helper: Don/doff right sock, Don/doff left sock                  Lower body assist Assist for lower body dressing:  (  total assist)      Toileting Toileting Toileting activity did not occur: No continent bowel/bladder event Toileting steps completed by patient: Adjust clothing prior to toileting, Adjust clothing after toileting Toileting steps completed by helper: Performs perineal hygiene Toileting Assistive Devices: Grab bar or rail  Toileting assist Assist level: Touching or steadying assistance (Pt.75%)   Transfers Chair/bed transfer   Chair/bed  transfer method: Stand pivot Chair/bed transfer assist level: Supervision or verbal cues Chair/bed transfer assistive device: Armrests     Locomotion Ambulation     Max distance: 175 Assist level: Supervision or verbal cues   Wheelchair          Cognition Comprehension Comprehension assist level: Understands basic less than 25% of the time/ requires cueing >75% of the time  Expression Expression assist level: Expresses basis less than 25% of the time/requires cueing >75% of the time.  Social Interaction Social Interaction assist level: Interacts appropriately less than 25% of the time. May be withdrawn or combative.  Problem Solving Problem solving assist level: Solves basic less than 25% of the time - needs direction nearly all the time or does not effectively solve problems and may need a restraint for safety  Memory Memory assist level: Recognizes or recalls less than 25% of the time/requires cueing greater than 75% of the time   Medical Problem List and Plan: 1.  Right sided weakness with aphasia secondary to left MCA infarction embolic secondary to proximal M2 occlusion status post thrombectomy with  revascularization with history of multiple CVAs  -finalize dc planning. Working toward [employer] tomorrow.   -sounds as if he's near baseline 2.  DVT Prophylaxis/Anticoagulation: Requested pharmacy assistance with beginning coumadin:  goal INR of 2.5-3.5 3. Pain Management: Tylenol as needed 4. Mood: Provide emotional support 5. Neuropsych: This patient is capable of making decisions on his own behalf. 6. Skin/Wound Care: Routine skin checks 7. Fluids/Electrolytes/Nutrition: encourage PO  -continue replacing potassium  -labs all reviewed.  8. Dysphagia. Dysphagia #1 thin liquid. Follow-up speech therapy 9. Pneumonia. Zosyn/vancomycin  as directed.   -Complete course of prednisone with slow taper  -CXR personally reviewed and without new disease  -oxygen sats stable.   10. Atrial  fibrillation. Patient on Xarelto prior to admission. Discuss when to resume anticoagulation of Coumadin to begin the weekend of 12/06/2016. Cardiac rate controlled 11. CKD stage III. Follow-up chemistries serially 12. Diastolic congestive heart failure/CAD.   -mild fluid overload resolved. Stop lasix  -will hold on imdur until he's able to take pills non-crushed  -weights daily  -supplemental oxygen if needed. ?behavioral component to dyspnea 13. Hypertension. Coreg 6.25 mg twice a day 14. Diabetes mellitus and peripheral neuropathy. Hemoglobin A1c 6.0.SSI  -check cbg's qid,   -weaning prednisone to off over the next week---decrease to 20mg  tomorrow 15. MRSA PCR screen positive. Contact precautions 16. Hyperlipidemia. Lipitor 17. Asthma. Continue nebulizers as directed 18. Gout. Zyloprim 300 mg daily 19. ?Dysuria/abdominal pain with multiple soft stools:       -lasix stopped to decrease urinary frequency   -ucx negative  -check kub, cmet, cbc  -added prn maalox  -likely behavioral/cognitive component to presentation   LOS (Days) 7 A FACE TO FACE EVALUATION WAS PERFORMED  Ranelle OysterSWARTZ,ZACHARY T, MD 12/10/2016 8:29 AM

## 2016-12-10 NOTE — Progress Notes (Signed)
Occupational Therapy Session Note  Patient Details  Name: Olice Passman MRN: [id number] Date of Birth: 05/19/1926  Today's Date: 12/10/2016 OT Individual Time: 1000-1055 OT Individual Time Calculation (min): 55 min    Short Term Goals: Week 1:  OT Short Term Goal 1 (Week 1): Pt will follow single step commands during dressing tasks. OT Short Term Goal 2 (Week 1): Pt will notify staff of need to toilet. OT Short Term Goal 3 (Week 1): Pt will complete stand pivot toilet transfer with CGA. OT Short Term Goal 4 (Week 1): Pt will visually identify necessary grooming items to complete grooming routine.  Skilled Therapeutic Interventions/Progress Updates:    Pt resting in bed with family member present and no interpreter.  Pt's family related that pt had already washed up and donned clothing earlier in the morning. Per report pt completed tasks with min A/steady A.  Focus on practicing tub transfers with seat and toilet transfers.  Pt continues to require max verbal and demonstration cues to initiate tasks and for sequencing.  Pt also engaged in functional amb with Rollator in simulated home environment.  Pt returned to room and bed with family member present. .    Therapy Documentation Precautions:  Precautions Precautions: Fall Restrictions Weight Bearing Restrictions: No General:   Vital Signs: Therapy Vitals Pulse Rate: 67 BP: (!) 152/87 Oxygen Therapy SpO2: 100 % O2 Device: Not Delivered Pain:   ADL:   Vision   Perception    Praxis   Exercises:   Other Treatments:    See Function Navigator for Current Functional Status.   Therapy/Group: Individual Therapy  Rich Brave 12/10/2016, 11:03 AM

## 2016-12-11 LAB — URINE CULTURE: CULTURE: NO GROWTH

## 2016-12-11 LAB — CBC
HCT: 31.8 % — ABNORMAL LOW (ref 39.0–52.0)
HEMOGLOBIN: 10.6 g/dL — AB (ref 13.0–17.0)
MCH: 31.8 pg (ref 26.0–34.0)
MCHC: 33.3 g/dL (ref 30.0–36.0)
MCV: 95.5 fL (ref 78.0–100.0)
Platelets: 176 10*3/uL (ref 150–400)
RBC: 3.33 MIL/uL — ABNORMAL LOW (ref 4.22–5.81)
RDW: 16.9 % — ABNORMAL HIGH (ref 11.5–15.5)
WBC: 15.6 10*3/uL — ABNORMAL HIGH (ref 4.0–10.5)

## 2016-12-11 LAB — PROTIME-INR
INR: 2.77
Prothrombin Time: 29.1 seconds — ABNORMAL HIGH (ref 11.4–15.2)

## 2016-12-11 LAB — GLUCOSE, CAPILLARY: GLUCOSE-CAPILLARY: 117 mg/dL — AB (ref 65–99)

## 2016-12-11 MED ORDER — TAMSULOSIN HCL 0.4 MG PO CAPS
0.4000 mg | ORAL_CAPSULE | Freq: Every day | ORAL | 0 refills | Status: DC
Start: 2016-12-11 — End: 2017-06-07

## 2016-12-11 MED ORDER — PANTOPRAZOLE SODIUM 40 MG PO PACK
40.0000 mg | PACK | Freq: Every day | ORAL | 0 refills | Status: DC
Start: 2016-12-11 — End: 2017-03-23

## 2016-12-11 MED ORDER — ATORVASTATIN CALCIUM 40 MG PO TABS
40.0000 mg | ORAL_TABLET | Freq: Every day | ORAL | 5 refills | Status: DC
Start: 2016-12-11 — End: 2017-06-07

## 2016-12-11 MED ORDER — SACCHAROMYCES BOULARDII 250 MG PO CAPS: 250.0000 mg | ORAL_CAPSULE | Freq: Two times a day (BID) | ORAL | 0 refills | Status: DC

## 2016-12-11 MED ORDER — CARVEDILOL 6.25 MG PO TABS: 6.2500 mg | ORAL_TABLET | Freq: Two times a day (BID) | ORAL | 6 refills | Status: DC

## 2016-12-11 MED ORDER — POTASSIUM CHLORIDE ER 10 MEQ PO TBCR: 10.0000 meq | EXTENDED_RELEASE_TABLET | Freq: Every day | ORAL | 0 refills | Status: DC

## 2016-12-11 MED ORDER — ALLOPURINOL 300 MG PO TABS: 300.0000 mg | ORAL_TABLET | Freq: Every day | ORAL | 0 refills | Status: DC

## 2016-12-11 MED ORDER — PREDNISONE 10 MG PO TABS: 10.0000 mg | ORAL_TABLET | Freq: Every day | ORAL | 0 refills | Status: DC

## 2016-12-11 MED ORDER — WARFARIN SODIUM 2 MG PO TABS: 2.0000 mg | ORAL_TABLET | Freq: Every day | ORAL | 11 refills | Status: DC

## 2016-12-11 MED ORDER — NITROGLYCERIN 0.4 MG SL SUBL: SUBLINGUAL_TABLET | SUBLINGUAL | 5 refills | Status: DC

## 2016-12-11 NOTE — Progress Notes (Addendum)
[facility]ILITATION     PROGRESS NOTE    Subjective/Complaints: Denies pain. No new issues.  ROS: Limited due to cognitive/behavioral   Objective: Vital Signs: Blood pressure 131/78, pulse 73, temperature 98.3 F (36.8 C), temperature source Oral, resp. rate 15, height 5\' 5"  (1.651 m), weight 54.2 kg (119 lb 6.4 oz), SpO2 100 %. No results found.  Recent Labs  12/08/16 1218  WBC 16.6*  HGB 10.3*  HCT 32.1*  PLT 214    Recent Labs  12/08/16 1218  NA 134*  K 3.2*  CL 95*  GLUCOSE 177*  BUN 18  CREATININE 1.60*  CALCIUM 9.0   CBG (last 3)   Recent Labs  12/10/16 1646 12/10/16 2106 12/11/16 0644  GLUCAP 270* 82 117*    Wt Readings from Last 3 Encounters:  12/11/16 54.2 kg (119 lb 6.4 oz)  11/27/16 63.9 kg (140 lb 14 oz)  11/05/16 64.9 kg (143 lb)    Physical Exam:  Constitutional: He appears well-developed.   Frail appearing HENT:  Head: Normocephalic and atraumatic.  Eyes: EOM are normal. Right eye exhibits no discharge. Left eye exhibits no discharge.  Neck: Normal range of motion. Neck supple. No thyromegaly present.  Cardiovascular:   IRR IRR Respiratory: CTA Bilaterally without wheezes or rales. Normal effort  GI: BS+, belly soft, NT   Musculoskeletal: He exhibits no edema or tenderness.  Neurological: He is alert.      Moves all 4 limbs during functional activities.   Skin: Skin is warm and dry.  Psychiatric:  Unable to assess due to language, flat. Follows some commands  Assessment/Plan: 1. Right hemiparesis and functional deficits secondary to left MCA infarct which require 3+ hours per day of interdisciplinary therapy in a comprehensive inpatient rehab setting. Physiatrist is providing close team supervision and 24 hour management of active medical problems listed below. Physiatrist and rehab team continue to assess barriers to discharge/monitor patient progress toward functional and medical  goals.  Function:  Bathing Bathing position Bathing activity did not occur: Refused Position: Sitting EOB  Bathing parts Body parts bathed by patient: Right arm, Left arm, Chest, Abdomen, Front perineal area, Buttocks, Right upper leg, Left upper leg (per family report)    Bathing assist Assist Level: Touching or steadying assistance(Pt > 75%) (per family report)      Upper Body Dressing/Undressing Upper body dressing Upper body dressing/undressing activity did not occur: Refused What is the patient wearing?: Pull over shirt/dress       Pull over shirt/dress - Perfomed by helper: Thread/unthread right sleeve, Thread/unthread left sleeve, Put head through opening, Pull shirt over trunk        Upper body assist Assist Level: Supervision or verbal cues      Lower Body Dressing/Undressing Lower body dressing Lower body dressing/undressing activity did not occur: Refused What is the patient wearing?: Pants, Socks     Pants- Performed by patient: Thread/unthread right pants leg, Thread/unthread left pants leg, Pull pants up/down Pants- Performed by helper: Thread/unthread right pants leg, Thread/unthread left pants leg, Pull pants up/down, Fasten/unfasten pants Non-skid slipper socks- Performed by patient: Don/doff right sock, Don/doff left sock Non-skid slipper socks- Performed by helper: Don/doff right sock, Don/doff left sock                  Lower body assist Assist for lower body dressing: Touching or steadying assistance (Pt > 75%) (per family report)      Toileting Toileting Toileting activity did  not occur: No continent bowel/bladder event Toileting steps completed by patient: Adjust clothing prior to toileting, Adjust clothing after toileting, Performs perineal hygiene Toileting steps completed by helper: Performs perineal hygiene Toileting Assistive Devices: Grab bar or rail  Toileting assist Assist level: Touching or steadying assistance (Pt.75%)    Transfers Chair/bed transfer   Chair/bed transfer method: Stand pivot Chair/bed transfer assist level: Supervision or verbal cues Chair/bed transfer assistive device: Patent attorney     Max distance: 170 Assist level: Supervision or verbal cues   Wheelchair          Cognition Comprehension Comprehension assist level: Understands basic less than 25% of the time/ requires cueing >75% of the time  Expression Expression assist level: Expresses basis less than 25% of the time/requires cueing >75% of the time.  Social Interaction Social Interaction assist level: Interacts appropriately less than 25% of the time. May be withdrawn or combative.  Problem Solving Problem solving assist level: Solves basic less than 25% of the time - needs direction nearly all the time or does not effectively solve problems and may need a restraint for safety  Memory Memory assist level: Recognizes or recalls less than 25% of the time/requires cueing greater than 75% of the time   Medical Problem List and Plan: 1.  Right sided weakness with aphasia secondary to left MCA infarction embolic secondary to proximal M2 occlusion status post thrombectomy with  revascularization with history of multiple CVAs  -dc home today  -follow up with me in one month  -neuro follow up 2.  DVT Prophylaxis/Anticoagulation: Requested pharmacy assistance with beginning coumadin:  goal INR of 2.5-3.5 3. Pain Management: Tylenol as needed 4. Mood: Provide emotional support 5. Neuropsych: This patient is capable of making decisions on his own behalf. 6. Skin/Wound Care: Routine skin checks 7. Fluids/Electrolytes/Nutrition: encourage PO  -potassium supplement 8. Dysphagia. Dysphagia #1 thin liquid. Follow-up speech therapy 9. Pneumonia. Zosyn/vancomycin  as directed.   -Complete course of prednisone with slow taper  -CXR personally reviewed and without new disease  -oxygen sats stable.   -recheck cbc today  given leukocytosis from 10/29 (likely steroid induced)  10. Atrial fibrillation. Patient on Xarelto prior to admission. Discuss when to resume anticoagulation of Coumadin to begin the weekend of 12/06/2016. Cardiac rate controlled 11. CKD stage III. Follow-up chemistries serially 12. Diastolic congestive heart failure/CAD.   -lasix stopped  -appears euvolemic 13. Hypertension. Coreg 6.25 mg twice a day 14. Diabetes mellitus and peripheral neuropathy. Hemoglobin A1c 6.0.SSI  -check cbg's qid,   -weaning prednisone to off over the next week---20mg  today and wean to off over a week's time 15. MRSA PCR screen positive. Contact precautions 16. Hyperlipidemia. Lipitor 17. Asthma. Continue nebulizers as directed 18. Gout. Zyloprim 300 mg daily 19. ?Dysuria/abdominal pain with multiple soft stools:       -intermittent, behavioral component??  LOS (Days) 8 A FACE TO FACE EVALUATION WAS PERFORMED  Ranelle Oyster, MD 12/11/2016 8:47 AM

## 2016-12-11 NOTE — Progress Notes (Signed)
Social Work  Discharge Note  The overall goal for the admission was met for:   Discharge location: Yes - home with family providing 24/7 assistance  Length of Stay: Yes - 8 days  Discharge activity level: Yes - supervision to minimal assistance  Home/community participation: Yes  Services provided included: MD, RD, PT, OT, SLP, RN, TR, Pharmacy and SW and interpreter  Financial Services: Medicare and Medicaid  Follow-up services arranged: Home Health: RN, PT, OT, ST via [facility], DME: 16x16 lightweight w/c, cushion via [facility] and Patient/Family has no preference for HH/DME agencies  Comments (or additional information):  Patient/Family verbalized understanding of follow-up arrangements: Yes  Individual responsible for coordination of the follow-up plan: daughter  Confirmed correct DME delivered: Lennart Pall 12/11/2016    [place], [place]

## 2016-12-11 NOTE — Progress Notes (Signed)
Discharged home per wheelchair with wife and other family members. Illene Silver, PA gave lengthy discharge instructions, answered many questions. Belongings sent home with family

## 2017-01-06 ENCOUNTER — Other Ambulatory Visit: Payer: Self-pay

## 2017-01-06 ENCOUNTER — Encounter: Payer: Self-pay | Admitting: Physical Medicine & Rehabilitation

## 2017-01-06 ENCOUNTER — Encounter: Payer: Medicare Other | Attending: Physical Medicine & Rehabilitation | Admitting: Physical Medicine & Rehabilitation

## 2017-01-06 VITALS — BP 104/65 | HR 69

## 2017-01-06 DIAGNOSIS — Z8711 Personal history of peptic ulcer disease: Secondary | ICD-10-CM | POA: Insufficient documentation

## 2017-01-06 DIAGNOSIS — F329 Major depressive disorder, single episode, unspecified: Secondary | ICD-10-CM | POA: Diagnosis not present

## 2017-01-06 DIAGNOSIS — Z7901 Long term (current) use of anticoagulants: Secondary | ICD-10-CM | POA: Insufficient documentation

## 2017-01-06 DIAGNOSIS — N189 Chronic kidney disease, unspecified: Secondary | ICD-10-CM | POA: Insufficient documentation

## 2017-01-06 DIAGNOSIS — I2510 Atherosclerotic heart disease of native coronary artery without angina pectoris: Secondary | ICD-10-CM | POA: Diagnosis not present

## 2017-01-06 DIAGNOSIS — I714 Abdominal aortic aneurysm, without rupture: Secondary | ICD-10-CM | POA: Insufficient documentation

## 2017-01-06 DIAGNOSIS — I130 Hypertensive heart and chronic kidney disease with heart failure and stage 1 through stage 4 chronic kidney disease, or unspecified chronic kidney disease: Secondary | ICD-10-CM | POA: Insufficient documentation

## 2017-01-06 DIAGNOSIS — Z87891 Personal history of nicotine dependence: Secondary | ICD-10-CM | POA: Insufficient documentation

## 2017-01-06 DIAGNOSIS — E119 Type 2 diabetes mellitus without complications: Secondary | ICD-10-CM | POA: Diagnosis not present

## 2017-01-06 DIAGNOSIS — I428 Other cardiomyopathies: Secondary | ICD-10-CM | POA: Insufficient documentation

## 2017-01-06 DIAGNOSIS — I4891 Unspecified atrial fibrillation: Secondary | ICD-10-CM | POA: Insufficient documentation

## 2017-01-06 DIAGNOSIS — R410 Disorientation, unspecified: Secondary | ICD-10-CM | POA: Insufficient documentation

## 2017-01-06 DIAGNOSIS — I509 Heart failure, unspecified: Secondary | ICD-10-CM | POA: Diagnosis not present

## 2017-01-06 DIAGNOSIS — I6340 Cerebral infarction due to embolism of unspecified cerebral artery: Secondary | ICD-10-CM | POA: Insufficient documentation

## 2017-01-06 DIAGNOSIS — M109 Gout, unspecified: Secondary | ICD-10-CM | POA: Insufficient documentation

## 2017-01-06 DIAGNOSIS — I482 Chronic atrial fibrillation, unspecified: Secondary | ICD-10-CM

## 2017-01-06 DIAGNOSIS — E1122 Type 2 diabetes mellitus with diabetic chronic kidney disease: Secondary | ICD-10-CM | POA: Diagnosis not present

## 2017-01-06 NOTE — Progress Notes (Signed)
Subjective:    Patient ID: [name], male    DOB: 1926-02-22, 83 y.o.   MRN: [id number]  HPI   [name] is here in follow up of his left MCA infarct. He has been doing fairly well at home. PT, OT, and SLP are coming to the house. Family notices that he is still "confused". They feel that his mobility is essentially back to where it was before this last stroke  He is eating and drinking well. Sleep is not an issue. Bowel and bladder are emptying. He's only had one episode of urinary incontinence  He remains on coumadin for anticoagulation. He is having his INR checked weekly.    Pain Inventory Average Pain 0 Pain Right Now 0 My pain is no pain  In the last 24 hours, has pain interfered with the following? General activity 0 Relation with others 0 Enjoyment of life 0 What TIME of day is your pain at its worst? no pain Sleep (in general) Fair  Pain is worse with: no pain Pain improves with: no pain Relief from Meds: 0  Mobility walk without assistance walk with assistance use a cane use a walker ability to climb steps?  yes do you drive?  no use a wheelchair needs help with transfers  Function I need assistance with the following:  .  Neuro/Psych trouble walking confusion depression  Prior Studies Any changes since last visit?  no  Physicians involved in your care Any changes since last visit?  no   Family History  Problem Relation Age of Onset  . Colon cancer Neg Hx    Social History   Socioeconomic History  . Marital status: Married    Spouse name: san  . Number of children: 13  . Years of education: 4th grade  . Highest education level: Not on file  Social Needs  . Financial resource strain: Not on file  . Food insecurity - worry: Not on file  . Food insecurity - inability: Not on file  . Transportation needs - medical: Not on file  . Transportation needs - non-medical: Not on file  Occupational History  . Occupation: retired    Tobacco Use  . Smoking status: Former Smoker    Last attempt to quit: 02/17/2002    Years since quitting: 14.8  . Smokeless tobacco: Never Used  . Tobacco comment: Quit seven years ago   Substance and Sexual Activity  . Alcohol use: No  . Drug use: No  . Sexual activity: No  Other Topics Concern  . Not on file  Social History Narrative   Patient lives at home with his daughter   Patient is right handed    Patient drinks tea and coffee   Past Surgical History:  Procedure Laterality Date  . CAROTID STENT INSERTION  2015  . CAROTID STENT INSERTION N/A 05/09/2013   Procedure: CAROTID STENT INSERTION;  Surgeon: Runell Gess, MD;  Location: [facility] CATH LAB;  Service: Cardiovascular;  Laterality: N/A;  . ESOPHAGOGASTRODUODENOSCOPY  06/13/2008,12/31/12  . IR PERCUTANEOUS ART THROMBECTOMY/INFUSION INTRACRANIAL INC DIAG ANGIO  11/27/2016  . LEFT HEART CATHETERIZATION WITH CORONARY ANGIOGRAM N/A 05/04/2013   Procedure: LEFT HEART CATHETERIZATION WITH CORONARY ANGIOGRAM;  Surgeon: Peter M Swaziland, MD;  Location: [facility] CATH LAB;  Service: Cardiovascular;  Laterality: N/A;  . RADIOLOGY WITH ANESTHESIA N/A 11/27/2016   Procedure: IR WITH ANESTHESIA;  Surgeon: Julieanne Cotton, MD;  Location: [facility] OR;  Service: Radiology;  Laterality: N/A;   Past Medical History:  Diagnosis Date  . AAA (abdominal aortic aneurysm) (HCC)   . Anemia   . Asthma   . Carotid artery disease (HCC)   . CHF (congestive heart failure) (HCC)   . Coronary artery disease   . Depression   . Diabetes mellitus type 2 in nonobese (HCC)   . Gout   . H. pylori infection   . Hard of hearing   . Hiatal hernia   . Hyperplasia, prostate   . Hypertension   . PUD (peptic ulcer disease)   . Renal failure, acute (HCC) 11/08/2012   There were no vitals taken for this visit.  Opioid Risk Score:   Fall Risk Score:  `1  Depression screen PHQ 2/9  Depression screen PHQ 2/9 01/06/2017  Decreased Interest 0  Down, Depressed, Hopeless 0   PHQ - 2 Score 0      Review of Systems  Constitutional: Negative.   HENT: Negative.   Eyes: Negative.   Respiratory: Negative.   Cardiovascular: Negative.   Gastrointestinal: Negative.   Endocrine: Negative.   Genitourinary: Negative.   Musculoskeletal: Negative.   Skin: Negative.   Allergic/Immunologic: Negative.   Neurological: Negative.   Hematological: Negative.   Psychiatric/Behavioral: Negative.        Objective:   Physical Exam  Constitutional: NAD,  HENT:  Head: Normocephalic and atraumatic.  Eyes: PERRL Neck: Normal range of motion. Neck supple. No thyromegaly present.  Cardiovascular:   IRR IRR Respiratory: CTA Bilaterally without wheezes or rales. Normal effort  GI: BS +, non-tender, non-distended   Musculoskeletal: He exhibits no edema or tenderness.  Neurological: He is alert.      Moves all 4 limbs during functional activities.  does have delays in processing even with translator. Does make eye contact.  Skin: Skin is warm and dry.  Psychiatric:  Flat but cooperative        Assessment & Plan:  1. Right sided weakness with aphasia secondary to left MCA infarction embolic secondary to proximal M2 occlusion status post thrombectomy with revascularization with history of multiple CVAs             -continue with HH PT, OT, SLP   -if he has limited visits, it might be wise to shift some them to SLP which will be his main area of need   -discussed need for ongoing speech/language/cognitive exercises at home with family as well 2. Anticoagulation: COUMADIN  goal INR of 2.5-3.5. Followed per primary.  3. Pain Management: Tylenol as needed. Pain minimal at this point 4.   Atrial fibrillation. HR controlled,  5. CKD, DMII per primary   Fifteen minutes of face to face patient care time were spent during this visit. All questions were encouraged and answered.  Follow up with me in 3 months.

## 2017-01-06 NOTE — Patient Instructions (Signed)
PLEASE KEEP CHALLENGING HIM WITH WORDS, AND ACTIVITIES WHICH REQUIRE LANGUAGE.   PLEASE FEEL FREE TO CALL OUR OFFICE WITH ANY PROBLEMS OR QUESTIONS [phone])

## 2017-02-04 ENCOUNTER — Ambulatory Visit: Payer: Medicare Other | Admitting: Nurse Practitioner

## 2017-03-09 ENCOUNTER — Ambulatory Visit: Payer: Medicare Other | Admitting: Nurse Practitioner

## 2017-03-09 ENCOUNTER — Other Ambulatory Visit: Payer: Self-pay

## 2017-03-10 ENCOUNTER — Other Ambulatory Visit: Payer: Self-pay

## 2017-03-10 NOTE — Patient Outreach (Signed)
Telephone outreach to patient to obtain mRS was successfully completed. mRS = 4 

## 2017-03-11 ENCOUNTER — Inpatient Hospital Stay: Admission: RE | Admit: 2017-03-11 | Payer: Medicare Other | Source: Ambulatory Visit

## 2017-03-17 ENCOUNTER — Ambulatory Visit (INDEPENDENT_AMBULATORY_CARE_PROVIDER_SITE_OTHER)
Admission: RE | Admit: 2017-03-17 | Discharge: 2017-03-17 | Disposition: A | Discharge status: Home / Self Care | Payer: Medicare Other | Source: Ambulatory Visit | Attending: Pulmonary Disease | Admitting: Pulmonary Disease

## 2017-03-17 ENCOUNTER — Ambulatory Visit: Payer: Medicare Other | Admitting: Nurse Practitioner

## 2017-03-17 DIAGNOSIS — R911 Solitary pulmonary nodule: Secondary | ICD-10-CM

## 2017-03-18 ENCOUNTER — Encounter (HOSPITAL_COMMUNITY): Payer: Medicare Other

## 2017-03-23 ENCOUNTER — Ambulatory Visit (INDEPENDENT_AMBULATORY_CARE_PROVIDER_SITE_OTHER): Payer: Medicare Other | Admitting: Pulmonary Disease

## 2017-03-23 ENCOUNTER — Encounter: Payer: Self-pay | Admitting: Pulmonary Disease

## 2017-03-23 VITALS — BP 120/72 | HR 67 | Ht 60.0 in | Wt 128.0 lb

## 2017-03-23 DIAGNOSIS — J449 Chronic obstructive pulmonary disease, unspecified: Secondary | ICD-10-CM | POA: Diagnosis not present

## 2017-03-23 NOTE — Patient Instructions (Signed)
Please continue using your nebulizers as directed I have reviewed your CT scan.  It shows stable lung findings There is dilation of your blood vessels called aneurysm.  I will make sure that your primary care doctor Dr. Andrey Campanile get the report of this for his follow-up.  Follow-up in pulmonary clinic in 6 months.

## 2017-03-23 NOTE — Progress Notes (Signed)
[name]    [id number]    07-30-1926  Primary Care Physician:Wilson, [name], MD  Referring Physician: [name], MD [address] [place] [address]  Chief complaint:  Follow-up for  Abnormal CT scan with LUL opacity > resolved COPD GOLD B (CAT score 13, 1 exacerbation over past year)  HPI: 84 y.o.malewith medical history significant of hypertension, hyperlipidemia, gout, depression, CAD, PUD, BPH, chronic combined systolic and diastolic CHF, carotid artery stenosis, AAA, anemia, PAF on Xarelto, who was admitted from 10/03/15-10/10/15 with HCAP pneumonia. He was treated with augmentin and xithromax. He was seen again in the emergency room in 9/15/174 with persistent cough. He had a chest x-ray and CT scan which redemonstrated the left upper lobe opacity. He was given another course of antibiotics, clindamycin for 7 days.  He had a repeat CT scan of the chest which shows near complete resolution of the left upper lobe opacity with residual scarring. Also noted are emphysema and stable aortic aneurysm.   Interim History: Hospitalized in November 2018 with acute left MCA stroke.  Status post revascularization.  Hospital course complicated by right lower lobe pneumonia which was treated with broad antibiotics. Maintained on Brovana and Pulmicort.  Daughter states that his breathing is stable.  Outpatient Encounter Medications as of 03/23/2017  Medication Sig  . allopurinol (ZYLOPRIM) 300 MG tablet Take 1 tablet (300 mg total) by mouth daily. To prevent gout  . arformoterol (BROVANA) 15 MCG/2ML NEBU Take 2 mLs (15 mcg total) by nebulization 2 (two) times daily.  [name] atorvastatin (LIPITOR) 40 MG tablet Take 1 tablet (40 mg total) by mouth daily at 6 PM.  . budesonide (PULMICORT) 0.5 MG/2ML nebulizer solution Take 2 mLs (0.5 mg total) by nebulization 2 (two) times daily.  . carvedilol (COREG) 6.25 MG tablet Take 1 tablet (6.25 mg total) by mouth 2 (two) times  daily with a meal.  . ipratropium-albuterol (DUONEB) 0.5-2.5 (3) MG/3ML SOLN Take 3 mLs by nebulization 2 (two) times daily as needed (for shortness of breath or wheezing).  . nitroGLYCERIN (NITROSTAT) 0.4 MG SL tablet PLACE 1 TABLET UNDER THE TONGUE EVERY 5 MINUTES FOR 3 DOSES AS NEEDED FOR CHEST PAIN  . saccharomyces boulardii (FLORASTOR) 250 MG capsule Take 1 capsule (250 mg total) by mouth 2 (two) times daily.  . tamsulosin (FLOMAX) 0.4 MG CAPS capsule Take 1 capsule (0.4 mg total) by mouth daily. To improve bladder function  . warfarin (COUMADIN) 2 MG tablet Take 1 tablet (2 mg total) by mouth daily.  . [DISCONTINUED] pantoprazole sodium (PROTONIX) 40 mg/20 mL PACK Take 20 mLs (40 mg total) by mouth daily.  . [DISCONTINUED] senna-docusate (SENOKOT-S) 8.6-50 MG tablet Take 1 tablet by mouth at bedtime as needed for mild constipation.   No facility-administered encounter medications on file as of 03/23/2017.     Allergies as of 03/23/2017 - Review Complete 03/23/2017  Allergen Reaction Noted  . Levofloxacin Anaphylaxis and Other (See Comments) 07/31/2015  . Eliquis [apixaban] Itching and Swelling 07/31/2015  . Phenazopyridine hcl Other (See Comments) 12/15/2012  . Septra [sulfamethoxazole-trimethoprim] Nausea And Vomiting and Other (See Comments) 12/15/2012  . Latex Rash 10/03/2015  . Tape Rash 10/03/2015    Past Medical History:  Diagnosis Date  . AAA (abdominal aortic aneurysm) (HCC)   . Anemia   . Asthma   . Carotid artery disease (HCC)   . CHF (congestive heart failure) (HCC)   . Coronary artery disease   .  Depression   . Diabetes mellitus type 2 in nonobese (HCC)   . Gout   . H. pylori infection   . Hard of hearing   . Hiatal hernia   . Hyperplasia, prostate   . Hypertension   . PUD (peptic ulcer disease)   . Renal failure, acute (HCC) 11/08/2012    Past Surgical History:  Procedure Laterality Date  . CAROTID STENT INSERTION  2015  . CAROTID STENT INSERTION N/A  05/09/2013   Procedure: CAROTID STENT INSERTION;  Surgeon: Runell Gess, MD;  Location: [facility] CATH LAB;  Service: Cardiovascular;  Laterality: N/A;  . ESOPHAGOGASTRODUODENOSCOPY  06/13/2008,12/31/12  . IR PERCUTANEOUS ART THROMBECTOMY/INFUSION INTRACRANIAL INC DIAG ANGIO  11/27/2016  . LEFT HEART CATHETERIZATION WITH CORONARY ANGIOGRAM N/A 05/04/2013   Procedure: LEFT HEART CATHETERIZATION WITH CORONARY ANGIOGRAM;  Surgeon: Peter M Swaziland, MD;  Location: [facility] CATH LAB;  Service: Cardiovascular;  Laterality: N/A;  . RADIOLOGY WITH ANESTHESIA N/A 11/27/2016   Procedure: IR WITH ANESTHESIA;  Surgeon: Julieanne Cotton, MD;  Location: [facility] OR;  Service: Radiology;  Laterality: N/A;    Family History  Problem Relation Age of Onset  . Colon cancer Neg Hx     Social History   Socioeconomic History  . Marital status: Married    Spouse name: san  . Number of children: 13  . Years of education: 4th grade  . Highest education level: Not on file  Social Needs  . Financial resource strain: Not on file  . Food insecurity - worry: Not on file  . Food insecurity - inability: Not on file  . Transportation needs - medical: Not on file  . Transportation needs - non-medical: Not on file  Occupational History  . Occupation: retired  Tobacco Use  . Smoking status: Former Smoker    Last attempt to quit: 02/17/2002    Years since quitting: 15.1  . Smokeless tobacco: Never Used  . Tobacco comment: Quit seven years ago   Substance and Sexual Activity  . Alcohol use: No  . Drug use: No  . Sexual activity: No  Other Topics Concern  . Not on file  Social History Narrative   Patient lives at home with his daughter   Patient is right handed    Patient drinks tea and coffee   Review of systems: Review of Systems  Constitutional: Negative for fever and chills.  HENT: Negative.   Eyes: Negative for blurred vision.  Respiratory: as per HPI  Cardiovascular: Negative for chest pain and palpitations.    Gastrointestinal: Negative for vomiting, diarrhea, blood per rectum. Genitourinary: Negative for dysuria, urgency, frequency and hematuria.  Musculoskeletal: Negative for myalgias, back pain and joint pain.  Skin: Negative for itching and rash.  Neurological: Negative for dizziness, tremors, focal weakness, seizures and loss of consciousness.  Endo/Heme/Allergies: Negative for environmental allergies.  Psychiatric/Behavioral: Negative for depression, suicidal ideas and hallucinations.  All other systems reviewed and are negative.  Physical Exam: Blood pressure 120/72, pulse 67, height 5' (1.524 m), weight 128 lb (58.1 kg), SpO2 92 %. Gen:      No acute distress HEENT:  EOMI, sclera anicteric Neck:     No masses; no thyromegaly Lungs:    Clear to auscultation bilaterally; normal respiratory effort CV:         Regular rate and rhythm; no murmurs Abd:      + bowel sounds; soft, non-tender; no palpable masses, no distension Ext:    No edema; adequate peripheral perfusion Skin:  Warm and dry; no rash Neuro: alert and oriented x 3 Psych: normal mood and affect  Data Reviewed: CT scan 07/31/15- Stable aneurysm of aorta. No pulmonary mas or infiltrate. Emphysema CT scan 10/27/15- LUL mass.  CT scan 01/29/16-near-complete resolution of the left upper lobe mass with residual scarring. Centrilobular emphysema again demonstrated. Thoracic aortic aneurysm seen. CT scan 03/17/17- no abnormality in the left upper lobe.  Centrilobular emphysema, thoracic aortic aneurysm, infrarenal aortic aneurysm may be larger Reviewed the images personally.  PFTs 01/02/16 FVC 2.42 [113%), FEV1 1.69 [123%], F/F 70 Mild obstructive airway disease. Patient unable to perform DLCO and lung volumes.  Assessment:  #1 COPD GOLD B PFTs show mild obstructive airway disease. He is stable on Brovana and Pulmicort nebs and we will continue the same.  #2 Abnormal CT scan. The left upper lobe infiltrate has nearly  completely resolved with some residual opacity which is likely scarring. In retrospect the left upper lobe opacity was likely secondary to pneumonia. There is no evidence of malignancy.  There is mild reticulation at the bases which is not convincing for disease. No need for regular follow up.  CT scan also noted for thoracic and infrarenal aortic aneurysm.  I will see CC results to primary care for follow-up.  Plan/Recommendations: - Continue Brovana, pulmicort.  Duo nebs as needed.  Follow-up in 6 months. Chilton Greathouse MD [facility] Pager [phone] 03/23/2017, 11:17 AM  CC: [name], MD

## 2017-03-30 ENCOUNTER — Ambulatory Visit (HOSPITAL_COMMUNITY)
Admission: RE | Admit: 2017-03-30 | Discharge: 2017-03-30 | Disposition: A | Discharge status: Home / Self Care | Payer: Medicare Other | Source: Ambulatory Visit | Attending: Cardiovascular Disease | Admitting: Cardiovascular Disease

## 2017-03-30 DIAGNOSIS — I6523 Occlusion and stenosis of bilateral carotid arteries: Secondary | ICD-10-CM | POA: Insufficient documentation

## 2017-04-01 ENCOUNTER — Ambulatory Visit: Payer: Medicare Other | Admitting: Physical Medicine & Rehabilitation

## 2017-04-08 ENCOUNTER — Ambulatory Visit: Payer: Medicare Other | Admitting: Physical Medicine & Rehabilitation

## 2017-05-26 ENCOUNTER — Emergency Department (HOSPITAL_COMMUNITY): Payer: Medicare Other

## 2017-05-26 ENCOUNTER — Encounter (HOSPITAL_COMMUNITY): Payer: Self-pay | Admitting: Emergency Medicine

## 2017-05-26 ENCOUNTER — Inpatient Hospital Stay (HOSPITAL_COMMUNITY)
Admission: EM | Admit: 2017-05-26 | Discharge: 2017-06-07 | DRG: 871 | Disposition: A | Discharge status: Hospice | Payer: Medicare Other | Attending: Internal Medicine | Admitting: Internal Medicine

## 2017-05-26 DIAGNOSIS — Z7189 Other specified counseling: Secondary | ICD-10-CM | POA: Diagnosis not present

## 2017-05-26 DIAGNOSIS — Z515 Encounter for palliative care: Secondary | ICD-10-CM | POA: Diagnosis not present

## 2017-05-26 DIAGNOSIS — J9600 Acute respiratory failure, unspecified whether with hypoxia or hypercapnia: Secondary | ICD-10-CM | POA: Diagnosis present

## 2017-05-26 DIAGNOSIS — R4701 Aphasia: Secondary | ICD-10-CM | POA: Diagnosis not present

## 2017-05-26 DIAGNOSIS — N3090 Cystitis, unspecified without hematuria: Secondary | ICD-10-CM | POA: Diagnosis present

## 2017-05-26 DIAGNOSIS — E43 Unspecified severe protein-calorie malnutrition: Secondary | ICD-10-CM | POA: Diagnosis present

## 2017-05-26 DIAGNOSIS — I5042 Chronic combined systolic (congestive) and diastolic (congestive) heart failure: Secondary | ICD-10-CM | POA: Diagnosis present

## 2017-05-26 DIAGNOSIS — H9190 Unspecified hearing loss, unspecified ear: Secondary | ICD-10-CM | POA: Diagnosis present

## 2017-05-26 DIAGNOSIS — I714 Abdominal aortic aneurysm, without rupture, unspecified: Secondary | ICD-10-CM | POA: Diagnosis present

## 2017-05-26 DIAGNOSIS — R0902 Hypoxemia: Secondary | ICD-10-CM

## 2017-05-26 DIAGNOSIS — R1310 Dysphagia, unspecified: Secondary | ICD-10-CM | POA: Diagnosis present

## 2017-05-26 DIAGNOSIS — I2510 Atherosclerotic heart disease of native coronary artery without angina pectoris: Secondary | ICD-10-CM | POA: Diagnosis present

## 2017-05-26 DIAGNOSIS — J9602 Acute respiratory failure with hypercapnia: Secondary | ICD-10-CM | POA: Diagnosis not present

## 2017-05-26 DIAGNOSIS — R0600 Dyspnea, unspecified: Secondary | ICD-10-CM

## 2017-05-26 DIAGNOSIS — J9601 Acute respiratory failure with hypoxia: Secondary | ICD-10-CM | POA: Diagnosis present

## 2017-05-26 DIAGNOSIS — R079 Chest pain, unspecified: Secondary | ICD-10-CM | POA: Diagnosis present

## 2017-05-26 DIAGNOSIS — N189 Chronic kidney disease, unspecified: Secondary | ICD-10-CM | POA: Diagnosis not present

## 2017-05-26 DIAGNOSIS — J189 Pneumonia, unspecified organism: Secondary | ICD-10-CM

## 2017-05-26 DIAGNOSIS — R6521 Severe sepsis with septic shock: Secondary | ICD-10-CM | POA: Diagnosis present

## 2017-05-26 DIAGNOSIS — A419 Sepsis, unspecified organism: Secondary | ICD-10-CM | POA: Diagnosis present

## 2017-05-26 DIAGNOSIS — E860 Dehydration: Secondary | ICD-10-CM | POA: Diagnosis present

## 2017-05-26 DIAGNOSIS — Z66 Do not resuscitate: Secondary | ICD-10-CM | POA: Diagnosis present

## 2017-05-26 DIAGNOSIS — E870 Hyperosmolality and hypernatremia: Secondary | ICD-10-CM | POA: Diagnosis present

## 2017-05-26 DIAGNOSIS — E875 Hyperkalemia: Secondary | ICD-10-CM | POA: Diagnosis present

## 2017-05-26 DIAGNOSIS — I69320 Aphasia following cerebral infarction: Secondary | ICD-10-CM

## 2017-05-26 DIAGNOSIS — I779 Disorder of arteries and arterioles, unspecified: Secondary | ICD-10-CM | POA: Diagnosis not present

## 2017-05-26 DIAGNOSIS — J690 Pneumonitis due to inhalation of food and vomit: Secondary | ICD-10-CM | POA: Diagnosis present

## 2017-05-26 DIAGNOSIS — Z7901 Long term (current) use of anticoagulants: Secondary | ICD-10-CM | POA: Diagnosis not present

## 2017-05-26 DIAGNOSIS — I10 Essential (primary) hypertension: Secondary | ICD-10-CM | POA: Diagnosis not present

## 2017-05-26 DIAGNOSIS — D638 Anemia in other chronic diseases classified elsewhere: Secondary | ICD-10-CM | POA: Diagnosis present

## 2017-05-26 DIAGNOSIS — E1122 Type 2 diabetes mellitus with diabetic chronic kidney disease: Secondary | ICD-10-CM | POA: Diagnosis present

## 2017-05-26 DIAGNOSIS — I5032 Chronic diastolic (congestive) heart failure: Secondary | ICD-10-CM | POA: Diagnosis not present

## 2017-05-26 DIAGNOSIS — J449 Chronic obstructive pulmonary disease, unspecified: Secondary | ICD-10-CM | POA: Diagnosis present

## 2017-05-26 DIAGNOSIS — Z993 Dependence on wheelchair: Secondary | ICD-10-CM | POA: Diagnosis not present

## 2017-05-26 DIAGNOSIS — I130 Hypertensive heart and chronic kidney disease with heart failure and stage 1 through stage 4 chronic kidney disease, or unspecified chronic kidney disease: Secondary | ICD-10-CM | POA: Diagnosis present

## 2017-05-26 DIAGNOSIS — N179 Acute kidney failure, unspecified: Secondary | ICD-10-CM | POA: Diagnosis present

## 2017-05-26 DIAGNOSIS — N183 Chronic kidney disease, stage 3 unspecified: Secondary | ICD-10-CM | POA: Diagnosis present

## 2017-05-26 DIAGNOSIS — I739 Peripheral vascular disease, unspecified: Secondary | ICD-10-CM

## 2017-05-26 DIAGNOSIS — E871 Hypo-osmolality and hyponatremia: Secondary | ICD-10-CM | POA: Diagnosis present

## 2017-05-26 DIAGNOSIS — I482 Chronic atrial fibrillation, unspecified: Secondary | ICD-10-CM | POA: Diagnosis present

## 2017-05-26 DIAGNOSIS — Z7401 Bed confinement status: Secondary | ICD-10-CM

## 2017-05-26 DIAGNOSIS — Z87891 Personal history of nicotine dependence: Secondary | ICD-10-CM

## 2017-05-26 DIAGNOSIS — I69391 Dysphagia following cerebral infarction: Secondary | ICD-10-CM

## 2017-05-26 DIAGNOSIS — I959 Hypotension, unspecified: Secondary | ICD-10-CM

## 2017-05-26 DIAGNOSIS — D631 Anemia in chronic kidney disease: Secondary | ICD-10-CM | POA: Diagnosis not present

## 2017-05-26 HISTORY — DX: Dependence on supplemental oxygen: Z99.81

## 2017-05-26 LAB — CBC WITH DIFFERENTIAL/PLATELET
BASOS ABS: 0 10*3/uL (ref 0.0–0.1)
BASOS PCT: 0 %
EOS ABS: 3.5 10*3/uL — AB (ref 0.0–0.7)
EOS PCT: 21 %
HEMATOCRIT: 26 % — AB (ref 39.0–52.0)
Hemoglobin: 8.3 g/dL — ABNORMAL LOW (ref 13.0–17.0)
Lymphocytes Relative: 6 %
Lymphs Abs: 0.9 10*3/uL (ref 0.7–4.0)
MCH: 29.7 pg (ref 26.0–34.0)
MCHC: 31.9 g/dL (ref 30.0–36.0)
MCV: 93.2 fL (ref 78.0–100.0)
MONO ABS: 0.1 10*3/uL (ref 0.1–1.0)
Monocytes Relative: 1 %
NEUTROS ABS: 12.1 10*3/uL — AB (ref 1.7–7.7)
Neutrophils Relative %: 72 %
PLATELETS: 262 10*3/uL (ref 150–400)
RBC: 2.79 MIL/uL — ABNORMAL LOW (ref 4.22–5.81)
RDW: 19.8 % — AB (ref 11.5–15.5)
WBC: 16.8 10*3/uL — ABNORMAL HIGH (ref 4.0–10.5)

## 2017-05-26 LAB — HEPATIC FUNCTION PANEL
ALBUMIN: 1.4 g/dL — AB (ref 3.5–5.0)
ALT: 12 U/L — ABNORMAL LOW (ref 17–63)
AST: 29 U/L (ref 15–41)
Alkaline Phosphatase: 135 U/L — ABNORMAL HIGH (ref 38–126)
BILIRUBIN INDIRECT: 0.6 mg/dL (ref 0.3–0.9)
BILIRUBIN TOTAL: 1 mg/dL (ref 0.3–1.2)
Bilirubin, Direct: 0.4 mg/dL (ref 0.1–0.5)
Total Protein: 6.4 g/dL — ABNORMAL LOW (ref 6.5–8.1)

## 2017-05-26 LAB — LACTIC ACID, PLASMA: Lactic Acid, Venous: 1.4 mmol/L (ref 0.5–1.9)

## 2017-05-26 LAB — BASIC METABOLIC PANEL
Anion gap: 7 (ref 5–15)
BUN: 37 mg/dL — AB (ref 6–20)
CHLORIDE: 102 mmol/L (ref 101–111)
CO2: 16 mmol/L — AB (ref 22–32)
CREATININE: 2.19 mg/dL — AB (ref 0.61–1.24)
Calcium: 7.4 mg/dL — ABNORMAL LOW (ref 8.9–10.3)
GFR calc Af Amer: 29 mL/min — ABNORMAL LOW (ref >60)
GFR calc non Af Amer: 25 mL/min — ABNORMAL LOW (ref >60)
GLUCOSE: 87 mg/dL (ref 65–99)
Potassium: 5.8 mmol/L — ABNORMAL HIGH (ref 3.5–5.1)
Sodium: 125 mmol/L — ABNORMAL LOW (ref 135–145)

## 2017-05-26 LAB — I-STAT CG4 LACTIC ACID, ED: Lactic Acid, Venous: 1.97 mmol/L — ABNORMAL HIGH (ref 0.5–1.9)

## 2017-05-26 LAB — PROTIME-INR
INR: 3
Prothrombin Time: 30.9 seconds — ABNORMAL HIGH (ref 11.4–15.2)

## 2017-05-26 LAB — BRAIN NATRIURETIC PEPTIDE: B NATRIURETIC PEPTIDE 5: 208.6 pg/mL — AB (ref 0.0–100.0)

## 2017-05-26 LAB — I-STAT TROPONIN, ED: Troponin i, poc: 0 ng/mL (ref 0.00–0.08)

## 2017-05-26 MED ORDER — ACETAMINOPHEN 325 MG PO TABS: 650.0000 mg | ORAL_TABLET | Freq: Four times a day (QID) | ORAL | Status: DC | PRN

## 2017-05-26 MED ORDER — PIPERACILLIN-TAZOBACTAM 3.375 G IVPB 30 MIN
3.3750 g | Freq: Once | INTRAVENOUS | Status: AC
  Administered 2017-05-26: 3.375 g via INTRAVENOUS
  Filled 2017-05-26: qty 50

## 2017-05-26 MED ORDER — OXYCODONE HCL 20 MG/ML PO CONC: 5.0000 mg | ORAL | Status: DC | PRN

## 2017-05-26 MED ORDER — DIPHENHYDRAMINE HCL 50 MG/ML IJ SOLN: 12.5000 mg | INTRAMUSCULAR | Status: DC | PRN

## 2017-05-26 MED ORDER — ONDANSETRON HCL 4 MG/2ML IJ SOLN
4.0000 mg | Freq: Four times a day (QID) | INTRAMUSCULAR | Status: DC | PRN
Start: 2017-05-26 — End: 2017-06-05

## 2017-05-26 MED ORDER — HALOPERIDOL LACTATE 5 MG/ML IJ SOLN: 0.5000 mg | INTRAMUSCULAR | Status: DC | PRN

## 2017-05-26 MED ORDER — HALOPERIDOL 0.5 MG PO TABS: 0.5000 mg | ORAL_TABLET | ORAL | Status: DC | PRN

## 2017-05-26 MED ORDER — TRAZODONE HCL 50 MG PO TABS: 25.0000 mg | ORAL_TABLET | Freq: Every evening | ORAL | Status: DC | PRN

## 2017-05-26 MED ORDER — SODIUM CHLORIDE 0.9 % IV BOLUS (SEPSIS)
500.0000 mL | Freq: Once | INTRAVENOUS | Status: AC
  Administered 2017-05-26: 500 mL via INTRAVENOUS

## 2017-05-26 MED ORDER — GLYCOPYRROLATE 0.2 MG/ML IJ SOLN: 0.2000 mg | INTRAMUSCULAR | Status: DC | PRN

## 2017-05-26 MED ORDER — VANCOMYCIN HCL IN DEXTROSE 1-5 GM/200ML-% IV SOLN
1000.0000 mg | Freq: Once | INTRAVENOUS | Status: AC
  Administered 2017-05-26: 1000 mg via INTRAVENOUS
  Filled 2017-05-26: qty 200

## 2017-05-26 MED ORDER — BIOTENE DRY MOUTH MT LIQD
15.0000 mL | OROMUCOSAL | Status: DC | PRN
Start: 2017-05-26 — End: 2017-06-07

## 2017-05-26 MED ORDER — ACETAMINOPHEN 650 MG RE SUPP: 650.0000 mg | Freq: Four times a day (QID) | RECTAL | Status: DC | PRN

## 2017-05-26 MED ORDER — FLUCONAZOLE 100 MG PO TABS: 100.0000 mg | ORAL_TABLET | Freq: Every day | ORAL | Status: DC

## 2017-05-26 MED ORDER — BUDESONIDE 0.5 MG/2ML IN SUSP
0.5000 mg | Freq: Two times a day (BID) | RESPIRATORY_TRACT | Status: DC
  Administered 2017-05-26 – 2017-06-07 (×24): 0.5 mg via RESPIRATORY_TRACT
  Filled 2017-05-26 (×27): qty 2

## 2017-05-26 MED ORDER — POLYVINYL ALCOHOL 1.4 % OP SOLN
1.0000 [drp] | Freq: Four times a day (QID) | OPHTHALMIC | Status: DC | PRN
  Filled 2017-05-26: qty 15

## 2017-05-26 MED ORDER — SODIUM CHLORIDE 0.9 % IV BOLUS (SEPSIS)
1000.0000 mL | Freq: Once | INTRAVENOUS | Status: AC
  Administered 2017-05-26: 1000 mL via INTRAVENOUS

## 2017-05-26 MED ORDER — LORAZEPAM 1 MG PO TABS: 1.0000 mg | ORAL_TABLET | ORAL | Status: DC | PRN

## 2017-05-26 MED ORDER — SCOPOLAMINE 1 MG/3DAYS TD PT72
1.0000 | MEDICATED_PATCH | TRANSDERMAL | Status: DC
  Administered 2017-05-26 – 2017-06-07 (×5): 1.5 mg via TRANSDERMAL
  Filled 2017-05-26 (×7): qty 1

## 2017-05-26 MED ORDER — LORAZEPAM 2 MG/ML IJ SOLN: 1.0000 mg | INTRAMUSCULAR | Status: DC | PRN

## 2017-05-26 MED ORDER — ACETAMINOPHEN 325 MG PO TABS
650.0000 mg | ORAL_TABLET | Freq: Four times a day (QID) | ORAL | Status: DC | PRN
  Administered 2017-06-02: 650 mg via ORAL
  Filled 2017-05-26: qty 2

## 2017-05-26 MED ORDER — SODIUM CHLORIDE 0.9 % IV BOLUS
1000.0000 mL | Freq: Once | INTRAVENOUS | Status: AC
  Administered 2017-05-26: 1000 mL via INTRAVENOUS

## 2017-05-26 MED ORDER — ALLOPURINOL 300 MG PO TABS
300.0000 mg | ORAL_TABLET | Freq: Every day | ORAL | Status: DC
  Filled 2017-05-26: qty 1

## 2017-05-26 MED ORDER — TAMSULOSIN HCL 0.4 MG PO CAPS: 0.4000 mg | ORAL_CAPSULE | Freq: Every day | ORAL | Status: DC

## 2017-05-26 MED ORDER — ENOXAPARIN SODIUM 40 MG/0.4ML ~~LOC~~ SOLN: 40.0000 mg | SUBCUTANEOUS | Status: DC

## 2017-05-26 MED ORDER — LORAZEPAM 2 MG/ML PO CONC: 1.0000 mg | ORAL | Status: DC | PRN

## 2017-05-26 MED ORDER — IOPAMIDOL (ISOVUE-370) INJECTION 76%
INTRAVENOUS | Status: AC
  Filled 2017-05-26: qty 100

## 2017-05-26 MED ORDER — VANCOMYCIN HCL 500 MG IV SOLR
500.0000 mg | INTRAVENOUS | Status: DC
  Administered 2017-05-28: 500 mg via INTRAVENOUS
  Filled 2017-05-26: qty 500

## 2017-05-26 MED ORDER — ACETYLCYSTEINE 20 % IN SOLN
3.0000 mL | Freq: Once | RESPIRATORY_TRACT | Status: DC
  Filled 2017-05-26: qty 4

## 2017-05-26 MED ORDER — SODIUM CHLORIDE 0.9 % IV SOLN
INTRAVENOUS | Status: DC
  Administered 2017-05-26: 250 mL via INTRAVENOUS

## 2017-05-26 MED ORDER — ONDANSETRON HCL 4 MG/2ML IJ SOLN: 4.0000 mg | Freq: Four times a day (QID) | INTRAMUSCULAR | Status: DC | PRN

## 2017-05-26 MED ORDER — GLYCOPYRROLATE 0.2 MG/ML IJ SOLN
0.2000 mg | INTRAMUSCULAR | Status: DC | PRN
  Administered 2017-05-30: 0.2 mg via INTRAVENOUS
  Filled 2017-05-26: qty 1

## 2017-05-26 MED ORDER — IPRATROPIUM-ALBUTEROL 0.5-2.5 (3) MG/3ML IN SOLN
3.0000 mL | Freq: Four times a day (QID) | RESPIRATORY_TRACT | Status: DC
  Administered 2017-05-26: 3 mL via RESPIRATORY_TRACT
  Filled 2017-05-26: qty 3

## 2017-05-26 MED ORDER — ONDANSETRON 4 MG PO TBDP: 4.0000 mg | ORAL_TABLET | Freq: Four times a day (QID) | ORAL | Status: DC | PRN

## 2017-05-26 MED ORDER — ATORVASTATIN CALCIUM 40 MG PO TABS: 40.0000 mg | ORAL_TABLET | Freq: Every day | ORAL | Status: DC

## 2017-05-26 MED ORDER — HALOPERIDOL LACTATE 2 MG/ML PO CONC
0.5000 mg | ORAL | Status: DC | PRN
  Filled 2017-05-26: qty 0.3

## 2017-05-26 MED ORDER — IPRATROPIUM-ALBUTEROL 0.5-2.5 (3) MG/3ML IN SOLN
3.0000 mL | Freq: Two times a day (BID) | RESPIRATORY_TRACT | Status: DC
  Administered 2017-05-26 – 2017-05-27 (×2): 3 mL via RESPIRATORY_TRACT
  Filled 2017-05-26 (×2): qty 3

## 2017-05-26 MED ORDER — ONDANSETRON HCL 4 MG PO TABS: 4.0000 mg | ORAL_TABLET | Freq: Four times a day (QID) | ORAL | Status: DC | PRN

## 2017-05-26 MED ORDER — SODIUM CHLORIDE 0.9 % IV BOLUS (SEPSIS)
250.0000 mL | Freq: Once | INTRAVENOUS | Status: AC
  Administered 2017-05-26: 250 mL via INTRAVENOUS

## 2017-05-26 MED ORDER — IOPAMIDOL (ISOVUE-370) INJECTION 76%
100.0000 mL | Freq: Once | INTRAVENOUS | Status: AC | PRN
  Administered 2017-05-26: 70 mL via INTRAVENOUS

## 2017-05-26 MED ORDER — BISACODYL 10 MG RE SUPP: 10.0000 mg | Freq: Every day | RECTAL | Status: DC | PRN

## 2017-05-26 MED ORDER — ARFORMOTEROL TARTRATE 15 MCG/2ML IN NEBU
15.0000 ug | INHALATION_SOLUTION | Freq: Two times a day (BID) | RESPIRATORY_TRACT | Status: DC
  Administered 2017-05-26 – 2017-06-07 (×24): 15 ug via RESPIRATORY_TRACT
  Filled 2017-05-26 (×27): qty 2

## 2017-05-26 MED ORDER — NOREPINEPHRINE BITARTRATE 1 MG/ML IV SOLN
0.0000 ug/min | Freq: Once | INTRAVENOUS | Status: AC
  Administered 2017-05-26: 15 ug/min via INTRAVENOUS
  Filled 2017-05-26: qty 4

## 2017-05-26 MED ORDER — GLYCOPYRROLATE 1 MG PO TABS: 1.0000 mg | ORAL_TABLET | ORAL | Status: DC | PRN

## 2017-05-26 MED ORDER — PIPERACILLIN-TAZOBACTAM IN DEX 2-0.25 GM/50ML IV SOLN
2.2500 g | Freq: Three times a day (TID) | INTRAVENOUS | Status: DC
  Administered 2017-05-26 – 2017-05-29 (×9): 2.25 g via INTRAVENOUS
  Filled 2017-05-26 (×11): qty 50

## 2017-05-26 MED ORDER — FLUCONAZOLE 100MG IVPB
100.0000 mg | INTRAVENOUS | Status: DC
  Filled 2017-05-26: qty 50

## 2017-05-26 NOTE — H&P (Signed)
History and Physical    [name] ZOX:[id number] DOB: 11-17-1926 DOA: 05/26/2017  PCP: [name], MD Patient coming from: home  Chief Complaint: chest pain  HPI: [name] is a 84 y.o. male with medical history significant for hypertension, diabetes, CAD, chronic combined systolic and diastolic heart failure, AAA, atrial fibrillation on Coumadin, COPD not on oxygen, stroke presents to the emergency department with the chief complaint chest pain. Initial evaluation reveals acute respiratory failure likely related to pneumonia. [facility] are asked to admit.  Information is obtained from the chart and the patient's daughter who speaks [place] and is at the bedside.daughter reports patient's baseline functional status is wheelchair-bound very hard f hearing a phasic from a previous stroke. She states he does not bear weight well and she and her family are having a difficult time assisting him with bathroom needs it etc. She states the last 3 days or so he has been complaining of what she believes this chest pain as he continuously points to his chest and says the word pain. She reports this is located mostly on the right side. She reports he has a chronic cough that doesn't seem to be any worse. As night he became more uncomfortable unable to rest so they brought him to the emergency department.    ED Course: in the emergency department he was hypotensive and hypothermic with an oxygen saturation level 85% on room air. He was provided with IV fluids oxygen supplementation antibiotics.at the time of admission his blood pressure remains somewhat soft his oxygen saturation levels 99% on 2 L.  Review of Systems: As per HPI otherwise all other systems reviewed and are negative.   Ambulatory Status: patient is bedbound/wheelchair bound at home and almost nonweightbearing according to family. A phasic hard of hearing total care Past Medical History:  Diagnosis Date  . AAA  (abdominal aortic aneurysm) (HCC)   . Anemia   . Asthma   . Carotid artery disease (HCC)   . CHF (congestive heart failure) (HCC)   . Coronary artery disease   . Depression   . Diabetes mellitus type 2 in nonobese (HCC)   . Gout   . H. pylori infection   . Hard of hearing   . Hiatal hernia   . Hyperplasia, prostate   . Hypertension   . PUD (peptic ulcer disease)   . Renal failure, acute (HCC) 11/08/2012    Past Surgical History:  Procedure Laterality Date  . CAROTID STENT INSERTION  2015  . CAROTID STENT INSERTION N/A 05/09/2013   Procedure: CAROTID STENT INSERTION;  Surgeon: Runell Gess, MD;  Location: [facility] CATH LAB;  Service: Cardiovascular;  Laterality: N/A;  . ESOPHAGOGASTRODUODENOSCOPY  06/13/2008,12/31/12  . IR PERCUTANEOUS ART THROMBECTOMY/INFUSION INTRACRANIAL INC DIAG ANGIO  11/27/2016  . LEFT HEART CATHETERIZATION WITH CORONARY ANGIOGRAM N/A 05/04/2013   Procedure: LEFT HEART CATHETERIZATION WITH CORONARY ANGIOGRAM;  Surgeon: Peter M Swaziland, MD;  Location: [facility] CATH LAB;  Service: Cardiovascular;  Laterality: N/A;  . RADIOLOGY WITH ANESTHESIA N/A 11/27/2016   Procedure: IR WITH ANESTHESIA;  Surgeon: Julieanne Cotton, MD;  Location: [facility] OR;  Service: Radiology;  Laterality: N/A;    Social History   Socioeconomic History  . Marital status: Married    Spouse name: san  . Number of children: 13  . Years of education: 4th grade  . Highest education level: Not on file  Occupational History  . Occupation: retired  Engineer, production  . Financial resource strain: Not on file  .  Food insecurity:    Worry: Not on file    Inability: Not on file  . Transportation needs:    Medical: Not on file    Non-medical: Not on file  Tobacco Use  . Smoking status: Former Smoker    Last attempt to quit: 02/17/2002    Years since quitting: 15.2  . Smokeless tobacco: Never Used  . Tobacco comment: Quit seven years ago   Substance and Sexual Activity  . Alcohol use: No  . Drug use: No  .  Sexual activity: Never  Lifestyle  . Physical activity:    Days per week: Not on file    Minutes per session: Not on file  . Stress: Not on file  Relationships  . Social connections:    Talks on phone: Not on file    Gets together: Not on file    Attends religious service: Not on file    Active member of club or organization: Not on file    Attends meetings of clubs or organizations: Not on file    Relationship status: Not on file  . Intimate partner violence:    Fear of current or ex partner: Not on file    Emotionally abused: Not on file    Physically abused: Not on file    Forced sexual activity: Not on file  Other Topics Concern  . Not on file  Social History Narrative   Patient lives at home with his daughter   Patient is right handed    Patient drinks tea and coffee    Allergies  Allergen Reactions  . Levofloxacin Anaphylaxis and Other (See Comments)    Patient told his daughter that it made him "feel worse than he already did" (overall) Headache also (has refused to take anymore)  . Eliquis [Apixaban] Itching and Swelling  . Phenazopyridine Hcl Other (See Comments)    Unknown reaction  . Septra [Sulfamethoxazole-Trimethoprim] Nausea And Vomiting and Other (See Comments)    GI Upset  . Latex Rash  . Tape Rash    Family History  Problem Relation Age of Onset  . Colon cancer Neg Hx     Prior to Admission medications   Medication Sig Start Date End Date Taking? Authorizing Provider  acetaminophen (TYLENOL) 650 MG CR tablet Take 1,300 mg by mouth every 8 (eight) hours as needed for pain.   Yes [provider]  allopurinol (ZYLOPRIM) 300 MG tablet Take 1 tablet (300 mg total) by mouth daily. To prevent gout 12/11/16  Yes Angiulli, Mcarthur Rossetti, PA-C  arformoterol (BROVANA) 15 MCG/2ML NEBU Take 2 mLs (15 mcg total) by nebulization 2 (two) times daily. 10/10/15  Yes Rhetta Mura, MD  atorvastatin (LIPITOR) 40 MG tablet Take 1 tablet (40 mg total) by mouth  daily at 6 PM. 12/11/16  Yes Angiulli, Mcarthur Rossetti, PA-C  budesonide (PULMICORT) 0.5 MG/2ML nebulizer solution Take 2 mLs (0.5 mg total) by nebulization 2 (two) times daily. 10/10/15  Yes Rhetta Mura, MD  carvedilol (COREG) 6.25 MG tablet Take 1 tablet (6.25 mg total) by mouth 2 (two) times daily with a meal. 12/11/16  Yes Angiulli, Mcarthur Rossetti, PA-C  esomeprazole (NEXIUM) 40 MG capsule Take 40 mg by mouth 2 (two) times daily before a meal.   Yes [provider]  fluconazole (DIFLUCAN) 100 MG tablet Take 100 mg by mouth daily.   Yes [provider]  nitroGLYCERIN (NITROSTAT) 0.4 MG SL tablet PLACE 1 TABLET UNDER THE TONGUE EVERY 5 MINUTES FOR 3 DOSES  AS NEEDED FOR CHEST PAIN 12/11/16  Yes Angiulli, Mcarthur Rossetti, PA-C  tamsulosin (FLOMAX) 0.4 MG CAPS capsule Take 1 capsule (0.4 mg total) by mouth daily. To improve bladder function 12/11/16  Yes Angiulli, Mcarthur Rossetti, PA-C  warfarin (COUMADIN) 2 MG tablet Take 1 tablet (2 mg total) by mouth daily. Patient taking differently: Take 1 mg by mouth once a week.  12/11/16 12/11/17 Yes Charlton Amor, PA-C    Physical Exam: Vitals:   05/26/17 0745 05/26/17 0800 05/26/17 0815 05/26/17 0840  BP: (!) 70/59 (!) 70/52 (!) 91/56   Pulse: 86 86 79   Resp: 19 13    Temp:      TempSrc:      SpO2: 97% 98% 96% 99%     General:  Appears calm and comfortable in no acute distress Eyes:  PERRL, EOMI, normal lids, iris ENT:  grossly normal hearing, lips & tongue, his membranes of her mouth somewhat pale somewhat dry Neck:  no LAD, masses or thyromegaly Cardiovascular:  RRR, no m/r/g. Trace edema Respiratory:  Mild increased work of breathing.bs distant but clear Abdomen:  soft, ntnd, positive bowel sounds throughout Skin:  no rash or induration seen on limited exam Musculoskeletal:  grossly normal tone BUE/BLE, good ROM, no bony abnormality Psychiatric:  grossly normal mood and affect, speech fluent and appropriate, AOx3 Neurologic:  Alert,  grunting, does not follow commands  Labs on Admission: I have personally reviewed following labs and imaging studies  CBC: Recent Labs  Lab 05/26/17 0430  WBC 16.8*  NEUTROABS 12.1*  HGB 8.3*  HCT 26.0*  MCV 93.2  PLT 262   Basic Metabolic Panel: Recent Labs  Lab 05/26/17 0430  NA 125*  K 5.8*  CL 102  CO2 16*  GLUCOSE 87  BUN 37*  CREATININE 2.19*  CALCIUM 7.4*   GFR: CrCl cannot be calculated (Unknown ideal weight.). Liver Function Tests: Recent Labs  Lab 05/26/17 0430  AST 29  ALT 12*  ALKPHOS 135*  BILITOT 1.0  PROT 6.4*  ALBUMIN 1.4*   No results for input(s): LIPASE, AMYLASE in the last 168 hours. No results for input(s): AMMONIA in the last 168 hours. Coagulation Profile: Recent Labs  Lab 05/26/17 0430  INR 3.00   Cardiac Enzymes: No results for input(s): CKTOTAL, CKMB, CKMBINDEX, TROPONINI in the last 168 hours. BNP (last 3 results) No results for input(s): PROBNP in the last 8760 hours. HbA1C: No results for input(s): HGBA1C in the last 72 hours. CBG: No results for input(s): GLUCAP in the last 168 hours. Lipid Profile: No results for input(s): CHOL, HDL, LDLCALC, TRIG, CHOLHDL, LDLDIRECT in the last 72 hours. Thyroid Function Tests: No results for input(s): TSH, T4TOTAL, FREET4, T3FREE, THYROIDAB in the last 72 hours. Anemia Panel: No results for input(s): VITAMINB12, FOLATE, FERRITIN, TIBC, IRON, RETICCTPCT in the last 72 hours. Urine analysis:    Component Value Date/Time   COLORURINE YELLOW 12/09/2016 2032   APPEARANCEUR CLEAR 12/09/2016 2032   APPEARANCEUR Clear 11/05/2016 1044   LABSPEC 1.013 12/09/2016 2032   PHURINE 7.0 12/09/2016 2032   GLUCOSEU 150 (A) 12/09/2016 2032   HGBUR NEGATIVE 12/09/2016 2032   BILIRUBINUR NEGATIVE 12/09/2016 2032   BILIRUBINUR Negative 11/05/2016 1044   KETONESUR NEGATIVE 12/09/2016 2032   PROTEINUR NEGATIVE 12/09/2016 2032   UROBILINOGEN 0.2 07/07/2014 0604   NITRITE NEGATIVE 12/09/2016 2032    LEUKOCYTESUR SMALL (A) 12/09/2016 2032   LEUKOCYTESUR Negative 11/05/2016 1044    Creatinine Clearance: CrCl cannot be calculated (Unknown  ideal weight.).  Sepsis Labs: @LABRCNTIP (procalcitonin:4,lacticidven:4) )No results found for this or any previous visit (from the past 240 hour(s)).   Radiological Exams on Admission: Dg Chest Portable 1 View  Result Date: 05/26/2017 CLINICAL DATA:  Acute onset of generalized chest pain. EXAM: PORTABLE CHEST 1 VIEW COMPARISON:  Chest radiograph performed 12/06/2016, and CT of the chest performed 03/17/2017 FINDINGS: The lungs are well-aerated. Mild left-sided atelectasis noted. There is no evidence of pleural effusion or pneumothorax. The cardiomediastinal silhouette is borderline enlarged. No acute osseous abnormalities are seen. IMPRESSION: Mild left-sided atelectasis.  Borderline cardiomegaly. Electronically Signed   By: Roanna Raider M.D.   On: 05/26/2017 05:32   Ct Angio Chest/abd/pel For Dissection W And/or Wo Contrast  Result Date: 05/26/2017 CLINICAL DATA:  Acute onset of cough and hypertension. Known abdominal aortic aneurysm. EXAM: CT ANGIOGRAPHY CHEST, ABDOMEN AND PELVIS TECHNIQUE: Multidetector CT imaging through the chest, abdomen and pelvis was performed using the standard protocol during bolus administration of intravenous contrast. Multiplanar reconstructed images and MIPs were obtained and reviewed to evaluate the vascular anatomy. CONTRAST:  70mL ISOVUE-370 IOPAMIDOL (ISOVUE-370) INJECTION 76% COMPARISON:  CT of the abdomen and pelvis performed 07/31/2015, and CT of the chest performed 03/17/2017 FINDINGS: CTA CHEST FINDINGS Cardiovascular: There is no evidence of aortic dissection. There is no evidence of aneurysmal dilatation. The ascending thoracic aorta is borderline normal in caliber. Scattered calcification is noted along the aortic arch, and mild mural thrombus is seen along the aortic arch and descending thoracic aorta, without  significant luminal narrowing. The great vessels are grossly unremarkable in appearance. Mediastinum/Nodes: There is borderline prominence of subcarinal nodes, measuring up to 1.1 cm in short axis. No additional mediastinal lymphadenopathy is seen. No pericardial effusion is identified. The thyroid gland is unremarkable. No axillary lymphadenopathy is seen. Lungs/Pleura: Patchy bilateral airspace opacities are noted, most prominent at the left lung base, though seen throughout all lobes, concerning for multifocal pneumonia. Minimal peripheral fibrotic change is noted at the lung bases. No pleural effusion or pneumothorax is seen. No dominant mass is identified. Musculoskeletal: No acute osseous abnormalities are identified. The visualized musculature is unremarkable in appearance. Review of the MIP images confirms the above findings. CTA ABDOMEN AND PELVIS FINDINGS VASCULAR Aorta: There is aneurysmal dilatation of the infrarenal abdominal aorta to 4.8 cm in AP and transverse dimensions, essentially stable from February. Underlying mural thrombus is noted, without evidence of aortic dissection. Celiac: The celiac trunk remains patent. SMA: The superior mesenteric artery remains patent. Renals: The renal arteries appear patent bilaterally, though there appears to be luminal narrowing along the proximal renal arteries bilaterally. IMA: The inferior mesenteric artery appears grossly patent, arising at the superior aspect of the aneurysm. 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 There is worsening severe chronic narrowing of the lumen of the right internal iliac artery aneurysm due to mural thrombus. Veins: Visualized venous structures are grossly unremarkable in appearance. Review of the MIP images confirms the above findings. NON-VASCULAR Hepatobiliary: The liver is unremarkable in appearance. The gallbladder is unremarkable in appearance. The common bile  duct remains normal in caliber. Pancreas: The pancreas is within normal limits. Spleen: The spleen is unremarkable in appearance. Adrenals/Urinary Tract: The adrenal glands are unremarkable in appearance. Mild bilateral renal atrophy is noted. There is no evidence of hydronephrosis. No renal or ureteral stones are identified. No perinephric stranding is seen. Stomach/Bowel: A small hiatal hernia is noted. The stomach is decompressed and  otherwise unremarkable. The small bowel is grossly unremarkable in appearance. The appendix is normal in caliber, without evidence of appendicitis. The colon is grossly unremarkable in appearance. Lymphatic: The stomach is unremarkable in appearance. The small bowel is within normal limits. The appendix is normal in caliber, without evidence of appendicitis. The colon is unremarkable in appearance. Reproductive: The bladder demonstrates mild wall thickening and soft tissue inflammation, raising question for cystitis. The prostate remains normal in size. Other: No additional soft tissue abnormalities are seen. Musculoskeletal: No acute osseous abnormalities are identified. Chronic bilateral pars defects are seen at L5, without significant anterolisthesis. The visualized musculature is unremarkable in appearance. Review of the MIP images confirms the above findings. IMPRESSION: 1. No evidence of acute aortic dissection. 2. Patchy bilateral airspace opacities, most prominent at the left lung base, concerning for multifocal pneumonia. 3. Mild bladder wall thickening and soft tissue inflammation raises question for cystitis. Would correlate for associated symptoms. 4. Aneurysmal dilatation of the infrarenal abdominal aorta to 4.8 cm in AP and transverse dimensions, essentially stable from February. Underlying mural thrombus noted. 5. Increasing mural thrombus along the right internal iliac artery aneurysm, with severe chronic luminal narrowing. 6. 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 7. Borderline prominence of subcarinal nodes, measuring up to 1.1 cm in short axis. 8. Small hiatal hernia. 9. Mild bilateral renal atrophy. 10. Chronic bilateral pars defects at L5, without significant anterolisthesis. Electronically Signed   By: Roanna Raider M.D.   On: 05/26/2017 06:52    EKG: Atrial fibrillation Left axis deviation Low voltage QRS Inferior infarct , age undetermined  Assessment/Plan Principal Problem:   Acute respiratory failure (HCC) Active Problems:   Acute renal failure superimposed on stage 3 chronic kidney disease (HCC)   AAA (abdominal aortic aneurysm) without rupture (HCC)   Coronary artery disease   Chest pain at rest   Hyponatremia   Carotid artery disease (HCC)   CAP (community acquired pneumonia)   Sepsis (HCC)   COPD, group B, by GOLD 2017 classification (HCC)   Aphasia   Benign essential HTN   Chronic atrial fibrillation (HCC)   Chronic diastolic congestive heart failure (HCC)   Cystitis   Hyperkalemia   #1. Acute respiratory failure likely related to related pneumonia in the setting of COPD. CT of the chest reveals bilateral pneumonia,oxygen saturation level 85% on room air presentation. Provided with oxygen supplementation IV fluids and antibiotics. -Admit to step down -Continue oxygen supplementation -Continue IV antibiotics per protocol -nebulizers -monitor closely  #2. CAP. CT chest as noted above. Patient hypothermic with the leukocytosis mildly elevated lactic acid. He is provided with IV fluids and antibiotics. At the time of admission blood pressure remains soft -Antibiotics per protocol -Follow blood cultures -obtain sputum cultures as able -track lactic acid -See #1  #3.sepsis. Patient is hypothermic, hypotensive, hypoxic, mildly elevated lactic acid, acute on chronic kidney disease. He received IV fluids and antibiotics in the emergency department with some improvement. -Continue  IV fluids -Antibiotics as noted above -Track lactic acid -continue bear hugger -monitor closely -patient is dnr per family  #4. Acute renal failure superimposed on chronic kidney disease stage III. Likely related to above. Creatinine 2.19 on admission. Chart review appears that baseline closer 1.4. -Hold nephrotoxins -Monitor urine output -IV fluids as noted above -Recheck in the morning  #5.CAD/chest pain/systolic CHF/A. Fib. Patient complained of chest pain prior to admission. EKG as noted above.initial troponin negative. Echo done 7  months ago reveals an EF 55% moderate concentric hypertrophy. bnp 208. Rate controlled. Home meds include Coreg, Coumadin. inr 3.0 -monitor on telemetry -Cycle troponin -Obtain daily weights -Monitor intake and output -coumadin per pharmacy  #6.hyponatremia/hyperkalemia. Potassium 5.8 sodium 125. Likely related decreased oral intake/dehydration. IV fluids as noted above -serum osmolality -urine sodium and osmolality -recheck this afternoon  #7.AAA.cT obtained shows no obvious dissection or ruptured aneurysm. Per ED provider discussion with radiologist indicates patient has growing aneurysm and all dissection. Reports patient would want surgery if he was a candidate in spite of his DO NOT RESUSCITATE status -monitor  #8. Hypertension. Blood patient's blood pressure very soft in the emergency department. Home medications include Coreg -IV fluids as noted above -Hold Coreg -Monitor  #9. COPD. Patient with a chronic cough. Not on home oxygen. ET of the chest as noted above. See #1 -Nebulizers -Monitor  #10.cystitis per CT -continue broad antibiotics -follow urine culture    DVT prophylaxis: comadin Code Status: dnr  Family Communication: daughter at bedside  Disposition Plan: facility  Consults called: PT/OT social work for placement   Admission status: inpatient    Gwenyth Bender MD [facility]  If 7PM-7AM, please contact  night-coverage [email or web address] Password [facility]  05/26/2017, 9:48 AM

## 2017-05-26 NOTE — ED Notes (Signed)
Bed linens changed noticed bruising and redness around IV site left a/c. No fluids were given through this IV>

## 2017-05-26 NOTE — Progress Notes (Signed)
1740- Patient arrived to 2W20 via stretcher. Patient is non [place] speaking. Per MD patient is comfort care. Family would except IVF/Abx at this time. Wife and sons are at bedside.

## 2017-05-26 NOTE — Progress Notes (Signed)
Pharmacy Antibiotic Note  [name] is a 84 y.o. male admitted on 05/26/2017 with PNA/sepsis.  Pharmacy has been consulted for Vancomycin and Zosyn  Dosing.  Vancomycin 1 g IV given in ED at  0645  Plan: Vancomycin 500 mg IV q48h Zosyn 2.25 g IV q8h      Temp (24hrs), Avg:94 F (34.4 C), Min:94 F (34.4 C), Max:94 F (34.4 C)  Recent Labs  Lab 05/26/17 0430 05/26/17 0457  WBC 16.8*  --   CREATININE 2.19*  --   LATICACIDVEN  --  1.97*    CrCl cannot be calculated (Unknown ideal weight.).    Allergies  Allergen Reactions  . Levofloxacin Anaphylaxis and Other (See Comments)    Patient told his daughter that it made him "feel worse than he already did" (overall) Headache also (has refused to take anymore)  . Eliquis [Apixaban] Itching and Swelling  . Phenazopyridine Hcl Other (See Comments)    Unknown reaction  . Septra [Sulfamethoxazole-Trimethoprim] Nausea And Vomiting and Other (See Comments)    GI Upset  . Latex Rash  . Tape Rash    Eddie Candle 05/26/2017 8:00 AM

## 2017-05-26 NOTE — ED Notes (Signed)
Respiratory aware of need for mucomyst.  States will speak to admitting about appropriateness of medication

## 2017-05-26 NOTE — ED Triage Notes (Signed)
Pt arrives with family for chest pain, pt coughing with wet sounding cough,. Family reports hx lung disease, CHF. Pt slumped over in chair, responsive to pain. Language barrier

## 2017-05-26 NOTE — ED Notes (Signed)
Attempted to insert temp foley unable to get pass prostate, son states they usually have a hard time. MD aware.

## 2017-05-26 NOTE — ED Provider Notes (Signed)
TIME SEEN: 5:00 AM  CHIEF COMPLAINT: Chest pain  HPI: Patient is an 84 year old male with history of hypertension, diabetes, CAD, CHF, AAA, atrial fibrillation on Coumadin, COPD not on oxygen, previous CVA who presents to the emergency department with his family.  His daughter reports that he has had a previous stroke and is a phasic at baseline.  They state that over the past 3-4 days he has been pointing to his chest mostly over the right side and is able to say the word pain.  They thought it was from having to lift him to help transfer him as he is mostly bedbound at baseline.  They state that he seems to be more uncomfortable tonight and they brought him to the emergency department.  Has had cough but she reports this is chronic from a history of COPD.  No known vomiting or diarrhea.  Daughter and son at bedside.  They both speak [place].  They reports patient understands [place] but is also very hard of hearing.  ROS: Level 5 caveat secondary to aphasia  PAST MEDICAL HISTORY/PAST SURGICAL HISTORY:  Past Medical History:  Diagnosis Date  . AAA (abdominal aortic aneurysm) (HCC)   . Anemia   . Asthma   . Carotid artery disease (HCC)   . CHF (congestive heart failure) (HCC)   . Coronary artery disease   . Depression   . Diabetes mellitus type 2 in nonobese (HCC)   . Gout   . H. pylori infection   . Hard of hearing   . Hiatal hernia   . Hyperplasia, prostate   . Hypertension   . PUD (peptic ulcer disease)   . Renal failure, acute (HCC) 11/08/2012    MEDICATIONS:  Prior to Admission medications   Medication Sig Start Date End Date Taking? Authorizing Provider  allopurinol (ZYLOPRIM) 300 MG tablet Take 1 tablet (300 mg total) by mouth daily. To prevent gout 12/11/16   Angiulli, Mcarthur Rossetti, PA-C  arformoterol (BROVANA) 15 MCG/2ML NEBU Take 2 mLs (15 mcg total) by nebulization 2 (two) times daily. 10/10/15   Rhetta Mura, MD  atorvastatin (LIPITOR) 40 MG tablet Take 1 tablet (40 mg  total) by mouth daily at 6 PM. 12/11/16   Angiulli, Mcarthur Rossetti, PA-C  budesonide (PULMICORT) 0.5 MG/2ML nebulizer solution Take 2 mLs (0.5 mg total) by nebulization 2 (two) times daily. 10/10/15   Rhetta Mura, MD  carvedilol (COREG) 6.25 MG tablet Take 1 tablet (6.25 mg total) by mouth 2 (two) times daily with a meal. 12/11/16   Angiulli, Mcarthur Rossetti, PA-C  ipratropium-albuterol (DUONEB) 0.5-2.5 (3) MG/3ML SOLN Take 3 mLs by nebulization 2 (two) times daily as needed (for shortness of breath or wheezing).    [provider]  nitroGLYCERIN (NITROSTAT) 0.4 MG SL tablet PLACE 1 TABLET UNDER THE TONGUE EVERY 5 MINUTES FOR 3 DOSES AS NEEDED FOR CHEST PAIN 12/11/16   Angiulli, Mcarthur Rossetti, PA-C  saccharomyces boulardii (FLORASTOR) 250 MG capsule Take 1 capsule (250 mg total) by mouth 2 (two) times daily. 12/11/16   Angiulli, Mcarthur Rossetti, PA-C  tamsulosin (FLOMAX) 0.4 MG CAPS capsule Take 1 capsule (0.4 mg total) by mouth daily. To improve bladder function 12/11/16   Angiulli, Mcarthur Rossetti, PA-C  warfarin (COUMADIN) 2 MG tablet Take 1 tablet (2 mg total) by mouth daily. 12/11/16 12/11/17  Angiulli, Mcarthur Rossetti, PA-C    ALLERGIES:  Allergies  Allergen Reactions  . Levofloxacin Anaphylaxis and Other (See Comments)    Patient told his daughter that it  made him "feel worse than he already did" (overall) Headache also (has refused to take anymore)  . Eliquis [Apixaban] Itching and Swelling  . Phenazopyridine Hcl Other (See Comments)    Unknown reaction  . Septra [Sulfamethoxazole-Trimethoprim] Nausea And Vomiting and Other (See Comments)    GI Upset  . Latex Rash  . Tape Rash    SOCIAL HISTORY:  Social History   Tobacco Use  . Smoking status: Former Smoker    Last attempt to quit: 02/17/2002    Years since quitting: 15.2  . Smokeless tobacco: Never Used  . Tobacco comment: Quit seven years ago   Substance Use Topics  . Alcohol use: No    FAMILY HISTORY: Family History  Problem Relation Age of Onset   . Colon cancer Neg Hx     EXAM: BP (!) 90/55 (BP Location: Right Arm)   Pulse 68   Temp (!) 94 F (34.4 C) (Axillary) Comment: unable to obtain oral temp  Resp 12   SpO2 100%  CONSTITUTIONAL: Alert, elderly, a phasic which daughter reports is baseline HEAD: Normocephalic EYES: Conjunctivae clear, pupils appear equal, EOMI ENT: normal nose; moist mucous membranes NECK: Supple, no meningismus, no nuchal rigidity, no LAD  CARD: RRR; S1 and S2 appreciated; no murmurs, no clicks, no rubs, no gallops CHEST:  Chest wall is tender to palpation over the right chest wall.  No crepitus, ecchymosis, erythema, warmth, rash or other lesions present.   RESP: Normal chest excursion without splinting or tachypnea; breath sounds clear and equal bilaterally; no wheezes, no rhonchi, no rales, no hypoxia or respiratory distress ABD/GI: Normal bowel sounds; non-distended; soft, non-tender, no rebound, no guarding, no peritoneal signs, no hepatosplenomegaly BACK:  The back appears normal and is non-tender to palpation, there is no CVA tenderness EXT: Normal ROM in all joints; non-tender to palpation; no edema; normal capillary refill; no cyanosis, no calf tenderness or swelling, 2+ DP and radial pulses bilaterally but skin feels cool to touch without mottling    SKIN: Normal color for age and race;  no rash NEURO: Moves all extremities equally, does not answer questions or follow commands, opens eyes spontaneously, aphasic at baseline   MEDICAL DECISION MAKING: Patient here with complaints of chest pain.  History is very difficult to ascertain from patient's daughter and patient himself is a phasic.  She seems to think that the chest pain is musculoskeletal in nature.  He does seem slightly tender over the right chest wall without any associated lesions.  Was found to be hypotensive in triage but this has quickly improved without intervention.  He has been found to be hypothermic however which makes me concerned  for possible sepsis versus just being bedbound.  Will start broad-spectrum antibiotics, IV fluids and obtain labs, cultures, urine, chest x-ray.  Will place him under a Lawyer.  I am also concerned that he could have a dissection or worsening AAA given complaints of chest pain with brief episode of hypotension.  I feel he needs a CT angio.  We will also obtain cardiac workup as he has multiple risk factors for ACS.  Patient will need admission.  Rectal temp 94.6.  ED PROGRESS: Patient continues to be hypotensive intermittently.  Receiving IV fluids.  Creatinine is 2.19 a GFR of 29.  Has history of chronic kidney disease but this is worse today.  I had a lengthy discussion with patient's daughter at bedside about risk of CT contrast causing worsening renal failure and the need for  dialysis in the future.  Unfortunately I cannot rule out life-threatening dissection or ruptured aneurysm and I feel we need imaging.  Discussed this with Dr. Cherly Hensen with radiology as well.  Daughter consents to CT with contrast for further evaluation as she states he would want surgery if indicated.  She understands risk of renal failure and need for dialysis in the future.  She is also aware that patient is quite ill at this time.  Currently she would like to keep him a full code.  Patient has leukocytosis of 17,000.  Lactate mildly elevated.  Troponin negative.  Chest x-ray shows no acute abnormality other than borderline cardiomegaly.   Pt continues to be hypotensive despite IV hydration.  Levophed has been started.  CT obtained that shows no obvious dissection or ruptured aneurysm.  Hypotension may be secondary to sepsis.  No obvious source at this time.  I did have a lengthy discussion again with daughter prior to CT scan about risk of IV contrast and she agrees to this scan.  She would still want surgery if indicated but now she has decided to make patient a DNR/DNI.  She seems to be more aware of how sick the patient is  at this time.  Will discuss with critical care.    Patient's blood pressure improving with further IV hydration.  At this time we have stopped his IV Levophed.  He may be able to go to stepdown.  Have canceled the critical care consult.  BP very dependent on patient's positioning and positioning of cuff.    Discussed with radiologist who does not see any acute dissection or ruptured aneurysm.  He does have a growing aneurysm and old dissection noted.  He does appear to have bilateral pneumonia.  Otherwise CT the abdomen pelvis unremarkable.  He is receiving broad-spectrum antibiotics.   7:25 AM Discussed patient's case with hospitalist, Toya Smothers NP.  I have recommended admission and patient (and family if present) agree with this plan. Admitting physician will place admission orders.    CT also shows possible cystitis.  We have attempted to obtain urine throat with Foley catheter but unable to pass Foley.  We will continue to work on obtaining urine.  He has received broad-spectrum antibiotics.    I reviewed all nursing notes, vitals, pertinent previous records, EKGs, lab and urine results, imaging (as available).      EKG Interpretation  Date/Time:  Tuesday May 26 2017 04:29:35 EDT Ventricular Rate:  71 PR Interval:    QRS Duration: 88 QT Interval:  428 QTC Calculation: 465 R Axis:   -69 Text Interpretation:  Atrial fibrillation with premature ventricular or aberrantly conducted complexes Left axis deviation Low voltage QRS Septal infarct , age undetermined Inferior infarct , age undetermined Abnormal ECG Confirmed by Rochele Raring [phone]) on 05/26/2017 4:54:17 AM        CRITICAL CARE Performed by: Baxter Hire Josearmando Kuhnert   Total critical care time: 55 minutes  Critical care time was exclusive of separately billable procedures and treating other patients.  Critical care was necessary to treat or prevent imminent or life-threatening deterioration.  Critical care was time  spent personally by me on the following activities: development of treatment plan with patient and/or surrogate as well as nursing, discussions with consultants, evaluation of patient's response to treatment, examination of patient, obtaining history from patient or surrogate, ordering and performing treatments and interventions, ordering and review of laboratory studies, ordering and review of radiographic studies, pulse oximetry and re-evaluation of  patient's condition.    Darnisha Vernet, Layla Maw, DO 05/26/17 [phone]

## 2017-05-26 NOTE — ED Notes (Signed)
Transported to CT 

## 2017-05-26 NOTE — ED Notes (Signed)
Code sepsis activated RN Tresa Endo aware

## 2017-05-26 NOTE — Progress Notes (Signed)
ANTICOAGULATION CONSULT NOTE - Initial Consult  Pharmacy Consult for warfarin Indication: atrial fibrillation  Allergies  Allergen Reactions  . Levofloxacin Anaphylaxis and Other (See Comments)    Patient told his daughter that it made him "feel worse than he already did" (overall) Headache also (has refused to take anymore)  . Eliquis [Apixaban] Itching and Swelling  . Phenazopyridine Hcl Other (See Comments)    Unknown reaction  . Septra [Sulfamethoxazole-Trimethoprim] Nausea And Vomiting and Other (See Comments)    GI Upset  . Latex Rash  . Tape Rash     Vital Signs: Temp: 94 F (34.4 C) (04/16 0446) Temp Source: Axillary (04/16 0446) BP: 91/56 (04/16 0815) Pulse Rate: 79 (04/16 0815)  Labs: Recent Labs    05/26/17 0430  HGB 8.3*  HCT 26.0*  PLT 262  LABPROT 30.9*  INR 3.00  CREATININE 2.19*     Assessment: On warfarin PTA for AFib. Per outpatient notes, INR goal 2.5-3.5. Current INR 3. Daughter is knowledgeable about warfarin and explained unusual dosing with regards to sub/supratherapeutic INR as an outpatient.   PTA warfarin: 1 mg qSaturdays, no warfarin otherwise  Goal of Therapy:  INR 2.5-3.5 Monitor platelets by anticoagulation protocol: Yes   Plan:  -Continue warfarin per home regimen  -Daily INR   Baldemar Friday 05/26/2017,9:48 AM

## 2017-05-27 DIAGNOSIS — E871 Hypo-osmolality and hyponatremia: Secondary | ICD-10-CM

## 2017-05-27 DIAGNOSIS — I10 Essential (primary) hypertension: Secondary | ICD-10-CM

## 2017-05-27 DIAGNOSIS — J449 Chronic obstructive pulmonary disease, unspecified: Secondary | ICD-10-CM

## 2017-05-27 DIAGNOSIS — I482 Chronic atrial fibrillation: Secondary | ICD-10-CM

## 2017-05-27 LAB — MRSA PCR SCREENING: MRSA BY PCR: POSITIVE — AB

## 2017-05-27 MED ORDER — IPRATROPIUM-ALBUTEROL 0.5-2.5 (3) MG/3ML IN SOLN
3.0000 mL | Freq: Four times a day (QID) | RESPIRATORY_TRACT | Status: DC
  Administered 2017-05-27: 3 mL via RESPIRATORY_TRACT

## 2017-05-27 MED ORDER — IPRATROPIUM-ALBUTEROL 0.5-2.5 (3) MG/3ML IN SOLN
3.0000 mL | Freq: Three times a day (TID) | RESPIRATORY_TRACT | Status: DC
  Administered 2017-05-27 – 2017-06-02 (×17): 3 mL via RESPIRATORY_TRACT
  Filled 2017-05-27 (×17): qty 3

## 2017-05-27 MED ORDER — DEXTROSE-NACL 5-0.45 % IV SOLN
INTRAVENOUS | Status: AC
  Administered 2017-05-27: 16:00:00 via INTRAVENOUS

## 2017-05-27 NOTE — Progress Notes (Signed)
PROGRESS NOTE    Rodgerick Isaak  XBM:[id number] DOB: 1927-01-22 DOA: 05/26/2017 PCP: [name], MD   Brief Narrative:   84 year old with a history of diabetes mellitus type 2, essential hypertension, coronary artery disease, unspecified CHF was brought to the hospital with complains of chest pain.  Patient was found to be hypotensive initially despite of getting IV fluids but family did not want any aggressive measures besides IV fluids and antibiotics.  Chest x-ray was suggestive of possible community-acquired pneumonia therefore started on IV antibiotics.  Assessment & Plan:   Principal Problem:   Acute respiratory failure (HCC) Active Problems:   Acute renal failure superimposed on stage 3 chronic kidney disease (HCC)   AAA (abdominal aortic aneurysm) without rupture (HCC)   Coronary artery disease   Chest pain at rest   Hyponatremia   Carotid artery disease (HCC)   CAP (community acquired pneumonia)   Sepsis (HCC)   COPD, group B, by GOLD 2017 classification (HCC)   Aphasia   Benign essential HTN   Chronic atrial fibrillation (HCC)   Chronic diastolic congestive heart failure (HCC)   Cystitis   Hyperkalemia  Septic shock, improved Multifocal community-acquired pneumonia Acute hypoxic respiratory distress, 85% room air - Continue nebulizer treatments every 6 hours, in between as needed if necessary -Patient is currently on vancomycin and Zosyn -Supplemental oxygen as needed -Gentle IV fluids, maintain map above 65.  Previously family has refused central line placement for pressors therefore will give IV fluids with caution.  Patient has poor pulmonary reserve -Continue Pulmicort  Acute kidney injury on CKD stage III -Likely from hypotension and prerenal in nature.  Patient is getting IV fluids, avoid nephrotoxic drugs -We will monitor creatinine daily and urine output  Hyponatremia -Possibly secondary to dehydration.  Patient is getting gentle IV fluids.  Will  check daily sodium  COPD -Patient is not acute flare therefore not getting any steroids but patient is getting nebulizer treatments as mentioned above  History of coronary artery disease Atrial fibrillation Abdominal aortic aneurysm without rupture -Home medications currently on hold due to low blood pressure. -Patient is on Coumadin at home, will have pharmacy manage.  Given patient's prognosis and her mentation, I would advise against this but will a palliative care meeting to establish goals of care before stopping the medications.  Essential hypertension -Antihypertensives on hold  Patient has elderly and frail-appearing with overall poor prognosis given advanced age and multiple comorbidities.  Patient has poor desire to eat but family still wishes to feed her therefore I would advise with comfort feeding.  I have formally consulted palliative care to help establish goals of care.  DVT prophylaxis: Patient is on Coumadin Code Status: DO NOT RESUSCITATE Family Communication: None at bedside Disposition Plan: Maintain inpatient stay  Consultants:   Palliative care  Procedures:   None  Antimicrobials:   Vancomycin 4/16  Zosyn 4/16   Subjective: Patient is laying in the bed not in acute distress.  She is verbally unresponsive and does not carry on any meaningful conversation  Review of Systems Difficult to obtain given her mentation  Objective: Vitals:   05/26/17 2300 05/27/17 0413 05/27/17 0812 05/27/17 0823  BP: (!) 77/52 (!) 82/47  (!) 96/56  Pulse: 94 82  90  Resp: 20 18  18   Temp: 97.6 F (36.4 C) (!) 97.5 F (36.4 C)  98.1 F (36.7 C)  TempSrc: Oral Oral  Axillary  SpO2: 92% 96% 97% 93%    Intake/Output Summary (Last  24 hours) at 05/27/2017 1338 Last data filed at 05/27/2017 0600 Gross per 24 hour  Intake 1790 ml  Output 0 ml  Net 1790 ml   There were no vitals filed for this visit.  Examination:  General exam: Appears calm and comfortable,  elderly frail-appearing, she is nonverbal Respiratory system: Diffuse coarse breath sounds Cardiovascular system: S1 & S2 heard, RRR. No JVD, murmurs, rubs, gallops or clicks. No pedal edema. Gastrointestinal system: Abdomen is nondistended, soft and nontender. No organomegaly or masses felt. Normal bowel sounds heard. Central nervous system: Unable to get full neuro exam but withdraws to pain Extremities: Unable to fully assess Skin: No rashes, lesions or ulcers Psychiatry: Poor judgment    Data Reviewed:   CBC: Recent Labs  Lab 05/26/17 0430  WBC 16.8*  NEUTROABS 12.1*  HGB 8.3*  HCT 26.0*  MCV 93.2  PLT 262   Basic Metabolic Panel: Recent Labs  Lab 05/26/17 0430  NA 125*  K 5.8*  CL 102  CO2 16*  GLUCOSE 87  BUN 37*  CREATININE 2.19*  CALCIUM 7.4*   GFR: CrCl cannot be calculated (Unknown ideal weight.). Liver Function Tests: Recent Labs  Lab 05/26/17 0430  AST 29  ALT 12*  ALKPHOS 135*  BILITOT 1.0  PROT 6.4*  ALBUMIN 1.4*   No results for input(s): LIPASE, AMYLASE in the last 168 hours. No results for input(s): AMMONIA in the last 168 hours. Coagulation Profile: Recent Labs  Lab 05/26/17 0430  INR 3.00   Cardiac Enzymes: No results for input(s): CKTOTAL, CKMB, CKMBINDEX, TROPONINI in the last 168 hours. BNP (last 3 results) No results for input(s): PROBNP in the last 8760 hours. HbA1C: No results for input(s): HGBA1C in the last 72 hours. CBG: No results for input(s): GLUCAP in the last 168 hours. Lipid Profile: No results for input(s): CHOL, HDL, LDLCALC, TRIG, CHOLHDL, LDLDIRECT in the last 72 hours. Thyroid Function Tests: No results for input(s): TSH, T4TOTAL, FREET4, T3FREE, THYROIDAB in the last 72 hours. Anemia Panel: No results for input(s): VITAMINB12, FOLATE, FERRITIN, TIBC, IRON, RETICCTPCT in the last 72 hours. Sepsis Labs: Recent Labs  Lab 05/26/17 0457 05/26/17 0752  LATICACIDVEN 1.97* 1.4    Recent Results (from the  past 240 hour(s))  MRSA PCR Screening     Status: Abnormal   Collection Time: 05/27/17 10:45 AM  Result Value Ref Range Status   MRSA by PCR POSITIVE (A) NEGATIVE Final    Comment:        The GeneXpert MRSA Assay (FDA approved for NASAL specimens only), is one component of a comprehensive MRSA colonization surveillance program. It is not intended to diagnose MRSA infection nor to guide or monitor treatment for MRSA infections. RESULT CALLED TO, READ BACK BY AND VERIFIED WITH: Alinda Deem RN 13:20 05/27/17 ([email or web address]) Performed at [facility], [address] [place], [place] [address]          Radiology Studies: Dg Chest Portable 1 View  Result Date: 05/26/2017 CLINICAL DATA:  Acute onset of generalized chest pain. EXAM: PORTABLE CHEST 1 VIEW COMPARISON:  Chest radiograph performed 12/06/2016, and CT of the chest performed 03/17/2017 FINDINGS: The lungs are well-aerated. Mild left-sided atelectasis noted. There is no evidence of pleural effusion or pneumothorax. The cardiomediastinal silhouette is borderline enlarged. No acute osseous abnormalities are seen. IMPRESSION: Mild left-sided atelectasis.  Borderline cardiomegaly. Electronically Signed   By: Roanna Raider M.D.   On: 05/26/2017 05:32   Ct Angio Chest/abd/pel For Dissection W  And/or Wo Contrast  Result Date: 05/26/2017 CLINICAL DATA:  Acute onset of cough and hypertension. Known abdominal aortic aneurysm. EXAM: CT ANGIOGRAPHY CHEST, ABDOMEN AND PELVIS TECHNIQUE: Multidetector CT imaging through the chest, abdomen and pelvis was performed using the standard protocol during bolus administration of intravenous contrast. Multiplanar reconstructed images and MIPs were obtained and reviewed to evaluate the vascular anatomy. CONTRAST:  70mL ISOVUE-370 IOPAMIDOL (ISOVUE-370) INJECTION 76% COMPARISON:  CT of the abdomen and pelvis performed 07/31/2015, and CT of the chest performed 03/17/2017 FINDINGS: CTA CHEST FINDINGS  Cardiovascular: There is no evidence of aortic dissection. There is no evidence of aneurysmal dilatation. The ascending thoracic aorta is borderline normal in caliber. Scattered calcification is noted along the aortic arch, and mild mural thrombus is seen along the aortic arch and descending thoracic aorta, without significant luminal narrowing. The great vessels are grossly unremarkable in appearance. Mediastinum/Nodes: There is borderline prominence of subcarinal nodes, measuring up to 1.1 cm in short axis. No additional mediastinal lymphadenopathy is seen. No pericardial effusion is identified. The thyroid gland is unremarkable. No axillary lymphadenopathy is seen. Lungs/Pleura: Patchy bilateral airspace opacities are noted, most prominent at the left lung base, though seen throughout all lobes, concerning for multifocal pneumonia. Minimal peripheral fibrotic change is noted at the lung bases. No pleural effusion or pneumothorax is seen. No dominant mass is identified. Musculoskeletal: No acute osseous abnormalities are identified. The visualized musculature is unremarkable in appearance. Review of the MIP images confirms the above findings. CTA ABDOMEN AND PELVIS FINDINGS VASCULAR Aorta: There is aneurysmal dilatation of the infrarenal abdominal aorta to 4.8 cm in AP and transverse dimensions, essentially stable from February. Underlying mural thrombus is noted, without evidence of aortic dissection. Celiac: The celiac trunk remains patent. SMA: The superior mesenteric artery remains patent. Renals: The renal arteries appear patent bilaterally, though there appears to be luminal narrowing along the proximal renal arteries bilaterally. IMA: The inferior mesenteric artery appears grossly patent, arising at the superior aspect of the aneurysm. 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 There is worsening severe chronic narrowing of the lumen of the right  internal iliac artery aneurysm due to mural thrombus. Veins: Visualized venous structures are grossly unremarkable in appearance. Review of the MIP images confirms the above findings. NON-VASCULAR Hepatobiliary: The liver is unremarkable in appearance. The gallbladder is unremarkable in appearance. The common bile duct remains normal in caliber. Pancreas: The pancreas is within normal limits. Spleen: The spleen is unremarkable in appearance. Adrenals/Urinary Tract: The adrenal glands are unremarkable in appearance. Mild bilateral renal atrophy is noted. There is no evidence of hydronephrosis. No renal or ureteral stones are identified. No perinephric stranding is seen. Stomach/Bowel: A small hiatal hernia is noted. The stomach is decompressed and otherwise unremarkable. The small bowel is grossly unremarkable in appearance. The appendix is normal in caliber, without evidence of appendicitis. The colon is grossly unremarkable in appearance. Lymphatic: The stomach is unremarkable in appearance. The small bowel is within normal limits. The appendix is normal in caliber, without evidence of appendicitis. The colon is unremarkable in appearance. Reproductive: The bladder demonstrates mild wall thickening and soft tissue inflammation, raising question for cystitis. The prostate remains normal in size. Other: No additional soft tissue abnormalities are seen. Musculoskeletal: No acute osseous abnormalities are identified. Chronic bilateral pars defects are seen at L5, without significant anterolisthesis. The visualized musculature is unremarkable in appearance. Review of the MIP images confirms the above findings. IMPRESSION:  1. No evidence of acute aortic dissection. 2. Patchy bilateral airspace opacities, most prominent at the left lung base, concerning for multifocal pneumonia. 3. Mild bladder wall thickening and soft tissue inflammation raises question for cystitis. Would correlate for associated symptoms. 4. Aneurysmal  dilatation of the infrarenal abdominal aorta to 4.8 cm in AP and transverse dimensions, essentially stable from February. Underlying mural thrombus noted. 5. Increasing mural thrombus along the right internal iliac artery aneurysm, with severe chronic luminal narrowing. 6. 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 7. Borderline prominence of subcarinal nodes, measuring up to 1.1 cm in short axis. 8. Small hiatal hernia. 9. Mild bilateral renal atrophy. 10. Chronic bilateral pars defects at L5, without significant anterolisthesis. Electronically Signed   By: Roanna Raider M.D.   On: 05/26/2017 06:52        Scheduled Meds: . acetylcysteine  3 mL Nebulization Once  . arformoterol  15 mcg Nebulization BID  . budesonide  0.5 mg Nebulization BID  . ipratropium-albuterol  3 mL Nebulization BID  . scopolamine  1 patch Transdermal Q72H   Continuous Infusions: . sodium chloride 250 mL (05/26/17 0808)  . piperacillin-tazobactam (ZOSYN)  IV Stopped (05/27/17 0557)  . [START ON 05/28/2017] vancomycin       LOS: 1 day    Time spent: 30 mins    Ankit Joline Maxcy, MD [facility] Pager [phone]   If 7PM-7AM, please contact night-coverage [email or web address] Password [email or web address] 05/27/2017, 1:38 PM

## 2017-05-27 NOTE — Progress Notes (Signed)
Nutrition Brief Note  Pt identified on the </= braden score of 12 report. Pt's goal of care is comfort. Expected to have hospital death. No nutrition interventions warranted at this time.  Please consult as needed.   Maureen Chatters, RD, LDN Pager #: [phone] After-Hours Pager #: [phone]

## 2017-05-28 DIAGNOSIS — J9601 Acute respiratory failure with hypoxia: Secondary | ICD-10-CM

## 2017-05-28 DIAGNOSIS — I2510 Atherosclerotic heart disease of native coronary artery without angina pectoris: Secondary | ICD-10-CM

## 2017-05-28 DIAGNOSIS — I779 Disorder of arteries and arterioles, unspecified: Secondary | ICD-10-CM

## 2017-05-28 DIAGNOSIS — Z7189 Other specified counseling: Secondary | ICD-10-CM

## 2017-05-28 DIAGNOSIS — Z515 Encounter for palliative care: Secondary | ICD-10-CM

## 2017-05-28 LAB — BASIC METABOLIC PANEL
ANION GAP: 6 (ref 5–15)
BUN: 30 mg/dL — AB (ref 6–20)
CO2: 15 mmol/L — ABNORMAL LOW (ref 22–32)
Calcium: 7.2 mg/dL — ABNORMAL LOW (ref 8.9–10.3)
Chloride: 110 mmol/L (ref 101–111)
Creatinine, Ser: 1.9 mg/dL — ABNORMAL HIGH (ref 0.61–1.24)
GFR calc Af Amer: 35 mL/min — ABNORMAL LOW (ref >60)
GFR, EST NON AFRICAN AMERICAN: 30 mL/min — AB (ref >60)
Glucose, Bld: 114 mg/dL — ABNORMAL HIGH (ref 65–99)
POTASSIUM: 4.3 mmol/L (ref 3.5–5.1)
SODIUM: 131 mmol/L — AB (ref 135–145)

## 2017-05-28 LAB — CBC
HEMATOCRIT: 22.2 % — AB (ref 39.0–52.0)
HEMOGLOBIN: 7.2 g/dL — AB (ref 13.0–17.0)
MCH: 30 pg (ref 26.0–34.0)
MCHC: 32.4 g/dL (ref 30.0–36.0)
MCV: 92.5 fL (ref 78.0–100.0)
Platelets: 235 10*3/uL (ref 150–400)
RBC: 2.4 MIL/uL — ABNORMAL LOW (ref 4.22–5.81)
RDW: 19.9 % — AB (ref 11.5–15.5)
WBC: 18.4 10*3/uL — AB (ref 4.0–10.5)

## 2017-05-28 LAB — MAGNESIUM: MAGNESIUM: 1.5 mg/dL — AB (ref 1.7–2.4)

## 2017-05-28 MED ORDER — FAMOTIDINE IN NACL 20-0.9 MG/50ML-% IV SOLN
20.0000 mg | INTRAVENOUS | Status: DC
  Administered 2017-05-28 – 2017-06-05 (×9): 20 mg via INTRAVENOUS
  Filled 2017-05-28 (×9): qty 50

## 2017-05-28 MED ORDER — ENSURE ENLIVE PO LIQD
237.0000 mL | Freq: Two times a day (BID) | ORAL | Status: DC
  Administered 2017-05-28 – 2017-06-07 (×8): 237 mL via ORAL

## 2017-05-28 NOTE — Progress Notes (Signed)
PROGRESS NOTE    [name]  ZOX:[id number] DOB: [birth year] DOA: 05/26/2017 PCP: [name], MD   Brief Narrative:   84 year old with a history of diabetes mellitus type 2, essential hypertension, coronary artery disease, unspecified CHF, multiple CVAs was brought to the hospital with complains of chest pain.  Patient was found to be hypotensive initially despite of getting IV fluids but family did not want any aggressive measures besides IV fluids and antibiotics.  Chest x-ray was suggestive of possible community-acquired pneumonia therefore started on IV antibiotics. During the admission I had an extensive discussion with the patient's family member regarding his chronic long-term poor prognosis given his comorbidities and advanced age.  Family insisted to feed him despite aspiration risk but also requested speech and swallow evaluation.  Assessment & Plan:   Principal Problem:   Acute respiratory failure (HCC) Active Problems:   Acute renal failure superimposed on stage 3 chronic kidney disease (HCC)   AAA (abdominal aortic aneurysm) without rupture (HCC)   Coronary artery disease   Chest pain at rest   Hyponatremia   Carotid artery disease (HCC)   CAP (community acquired pneumonia)   Sepsis (HCC)   COPD, group B, by GOLD 2017 classification (HCC)   Aphasia   Benign essential HTN   Chronic atrial fibrillation (HCC)   Chronic diastolic congestive heart failure (HCC)   Cystitis   Hyperkalemia  Septic shock, improved Multifocal community-acquired pneumonia Acute hypoxic respiratory distress, 85% room air - Continue nebulizer treatments every 6 hours, in between as needed if necessary -Patient is currently on vancomycin and Zosyn day 2 -Supplemental oxygen as needed -Gentle IV fluids, maintain map above 65.  Previously family has refused central line placement for pressors therefore will give IV fluids with caution.  Patient has poor pulmonary reserve -Continue  Pulmicort -We will get speech and swallow evaluation.  In the meantime comfort feeds  Acute kidney injury on CKD stage III -Likely from hypotension and prerenal in nature.  Patient is getting IV fluids, avoid nephrotoxic drugs.  Creatinine is trended down slightly -We will monitor creatinine daily and urine output  Anemia of chronic disease -Hemoglobin is trended down to 7.2.  Will transfuse if less than 7.  Meantime will check iron studies, B12 and folate  Hyponatremia, improved -Possibly secondary to dehydration.  Patient is getting gentle IV fluids.  Will check daily sodium.  Has improved from 125 to131  COPD -Patient is not acute flare therefore not getting any steroids but patient is getting nebulizer treatments as mentioned above  History of coronary artery disease Atrial fibrillation Abdominal aortic aneurysm without rupture -Home medications currently on hold due to low blood pressure. -Patient is on Coumadin at home, will have pharmacy manage.  Given patient's prognosis and her mentation, I would advise against this but will a palliative care meeting to establish goals of care before stopping the medications.  Essential hypertension -Antihypertensives on hold  Moderate to severe protein calorie malnutrition -Due to his advanced age patient has very poor appetite. -Speech and swallow has been consulted -We will add boost to his diet  After long discussion with the patient's family member she would like to speak with palliative care service already consulted.  In the meantime family wants to feed the patient while awaiting speech and swallow evaluation.  I have clearly stated to them patient is a very high aspiration risk but would still like the patient to be fed.  DVT prophylaxis: Patient is on Coumadin Code Status:  DO NOT RESUSCITATE Family Communication: None at bedside Disposition Plan: Maintain inpatient stay  Consultants:   Palliative care  Procedures:    None  Antimicrobials:   Vancomycin 4/16  Zosyn 4/16   Subjective: No acute events overnight.  Patient is still verbally nonresponsive but vital signs are stable.  Had an extensive discussion with the patient's daughter and wife at bedside regarding his goals of care and long-term prognosis.  Family is aware that patient is a very high risk and all of his chronic conditions are not reversible or curable.  They also understand patient remains at a very high aspiration risk but have advised him that they can proceed with comfort feeds.  Family would still like official speech and swallow evaluation.  Review of Systems Difficult to obtain given her mentation  Objective: Vitals:   05/27/17 2040 05/28/17 0243 05/28/17 0811 05/28/17 0839  BP:  104/66  99/66  Pulse: 75 (!) 108 80 (!) 105  Resp: 16  18 (!) 22  Temp:  97.6 F (36.4 C)  98.3 F (36.8 C)  TempSrc:  Oral  Oral  SpO2: 94% 97% 98% 98%    Intake/Output Summary (Last 24 hours) at 05/28/2017 1214 Last data filed at 05/28/2017 0553 Gross per 24 hour  Intake 670.83 ml  Output -  Net 670.83 ml   There were no vitals filed for this visit.  Examination:  General exam: Appears calm and comfortable, elderly frail-appearing, he is nonverbal Respiratory system: Still diffuse coarse breath sounds Cardiovascular system: S1 & S2 heard, RRR. No JVD, murmurs, rubs, gallops or clicks. No pedal edema. Gastrointestinal system: Abdomen is nondistended, soft and nontender. No organomegaly or masses felt. Normal bowel sounds heard. Central nervous system: Unable to get full neuro exam but withdraws to pain Extremities: Unable to fully assess Skin: No rashes, lesions or ulcers Psychiatry: Poor judgment    Data Reviewed:   CBC: Recent Labs  Lab 05/26/17 0430 05/28/17 0232  WBC 16.8* 18.4*  NEUTROABS 12.1*  --   HGB 8.3* 7.2*  HCT 26.0* 22.2*  MCV 93.2 92.5  PLT 262 235   Basic Metabolic Panel: Recent Labs  Lab  05/26/17 0430 05/28/17 0232  NA 125* 131*  K 5.8* 4.3  CL 102 110  CO2 16* 15*  GLUCOSE 87 114*  BUN 37* 30*  CREATININE 2.19* 1.90*  CALCIUM 7.4* 7.2*  MG  --  1.5*   GFR: CrCl cannot be calculated (Unknown ideal weight.). Liver Function Tests: Recent Labs  Lab 05/26/17 0430  AST 29  ALT 12*  ALKPHOS 135*  BILITOT 1.0  PROT 6.4*  ALBUMIN 1.4*   No results for input(s): LIPASE, AMYLASE in the last 168 hours. No results for input(s): AMMONIA in the last 168 hours. Coagulation Profile: Recent Labs  Lab 05/26/17 0430  INR 3.00   Cardiac Enzymes: No results for input(s): CKTOTAL, CKMB, CKMBINDEX, TROPONINI in the last 168 hours. BNP (last 3 results) No results for input(s): PROBNP in the last 8760 hours. HbA1C: No results for input(s): HGBA1C in the last 72 hours. CBG: No results for input(s): GLUCAP in the last 168 hours. Lipid Profile: No results for input(s): CHOL, HDL, LDLCALC, TRIG, CHOLHDL, LDLDIRECT in the last 72 hours. Thyroid Function Tests: No results for input(s): TSH, T4TOTAL, FREET4, T3FREE, THYROIDAB in the last 72 hours. Anemia Panel: No results for input(s): VITAMINB12, FOLATE, FERRITIN, TIBC, IRON, RETICCTPCT in the last 72 hours. Sepsis Labs: Recent Labs  Lab 05/26/17 0457 05/26/17 4098  LATICACIDVEN 1.97* 1.4    Recent Results (from the past 240 hour(s))  Blood Culture (routine x 2)     Status: None (Preliminary result)   Collection Time: 05/26/17  4:30 AM  Result Value Ref Range Status   Specimen Description BLOOD RIGHT WRIST  Final   Special Requests   Final    BOTTLES DRAWN AEROBIC AND ANAEROBIC Blood Culture adequate volume   Culture   Final    NO GROWTH 1 DAY Performed at [facility], [address], [place], [place] [address]    Report Status PENDING  Incomplete  Blood Culture (routine x 2)     Status: None (Preliminary result)   Collection Time: 05/26/17  5:00 AM  Result Value Ref Range Status   Specimen Description  BLOOD RIGHT ARM  Final   Special Requests   Final    AEROBIC BOTTLE ONLY Blood Culture results may not be optimal due to an excessive volume of blood received in culture bottles   Culture   Final    NO GROWTH 1 DAY Performed at [facility], [address], [place], [place] [address]    Report Status PENDING  Incomplete  MRSA PCR Screening     Status: Abnormal   Collection Time: 05/27/17 10:45 AM  Result Value Ref Range Status   MRSA by PCR POSITIVE (A) NEGATIVE Final    Comment:        The GeneXpert MRSA Assay (FDA approved for NASAL specimens only), is one component of a comprehensive MRSA colonization surveillance program. It is not intended to diagnose MRSA infection nor to guide or monitor treatment for MRSA infections. RESULT CALLED TO, READ BACK BY AND VERIFIED WITH: Alinda Deem RN 13:20 05/27/17 ([email or web address]) Performed at [facility], [address] [place], [place] [address]          Radiology Studies: No results found.      Scheduled Meds: . acetylcysteine  3 mL Nebulization Once  . arformoterol  15 mcg Nebulization BID  . budesonide  0.5 mg Nebulization BID  . ipratropium-albuterol  3 mL Nebulization TID  . scopolamine  1 patch Transdermal Q72H   Continuous Infusions: . dextrose 5 % and 0.45% NaCl 50 mL/hr at 05/27/17 2000  . piperacillin-tazobactam (ZOSYN)  IV Stopped (05/28/17 0553)  . vancomycin Stopped (05/28/17 7628)     LOS: 2 days    Time spent: 30 mins    Madesyn Ast Joline Maxcy, MD [facility] Pager [phone]   If 7PM-7AM, please contact night-coverage [email or web address] Password [facility] 05/28/2017, 12:14 PM

## 2017-05-28 NOTE — Progress Notes (Signed)
ANTICOAGULATION CONSULT NOTE - Follow-Up Consult  Pharmacy Consult for warfarin Indication: atrial fibrillation  Allergies  Allergen Reactions  . Levofloxacin Anaphylaxis and Other (See Comments)    Patient told his daughter that it made him "feel worse than he already did" (overall) Headache also (has refused to take anymore)  . Eliquis [Apixaban] Itching and Swelling  . Phenazopyridine Hcl Other (See Comments)    Unknown reaction  . Septra [Sulfamethoxazole-Trimethoprim] Nausea And Vomiting and Other (See Comments)    GI Upset  . Latex Rash  . Tape Rash     Vital Signs: Temp: 98.3 F (36.8 C) (04/18 0839) Temp Source: Oral (04/18 0839) BP: 99/66 (04/18 0839) Pulse Rate: 105 (04/18 0839)  Labs: Recent Labs    05/26/17 0430 05/28/17 0232  HGB 8.3* 7.2*  HCT 26.0* 22.2*  PLT 262 235  LABPROT 30.9*  --   INR 3.00  --   CREATININE 2.19* 1.90*     Assessment: On warfarin PTA for AFib. Per outpatient notes, INR goal 2.5-3.5. Admit INR 3 on 4/16.  Daughter is knowledgeable about warfarin and explained unusual dosing with regards to sub/supratherapeutic INR as an outpatient.   PTA warfarin: 1 mg qSaturdays, no warfarin otherwise  No INR has been checked on 4/17 or 4/18 - originally d/ced as it was thought comfort measures might be pursued. Will order INR checks for 4/19 and 4/20 to match labs entered by the MD. PTA dose would be due on 4/20  Goal of Therapy:  INR 2.5-3.5 Monitor platelets by anticoagulation protocol: Yes   Plan:  - No warfarin today  - Will follow-up INR in the AM - Will follow-up palliative/GOC  Thank you for allowing pharmacy to be a part of this patient's care.  Georgina Pillion, PharmD, BCPS Clinical Pharmacist Pager: [phone] If after 3:30p, please call main pharmacy at: [phone] 05/28/2017 9:48 AM

## 2017-05-28 NOTE — Consult Note (Signed)
Consultation Note Date: 05/28/2017   Patient Name: [name]  DOB: 09/27/1926  MRN: [id number]  Age / Sex: 84 y.o., male  PCP: Christain Sacramento, MD Referring Physician: Damita Lack, MD  Reason for Consultation: Establishing goals of care and Hospice Evaluation  HPI/Patient Profile: 84 y.o. male  with past medical history of T2DM, HTN, CAD, CHF, CVA, CKD3, AAA, COPD, and a fib on coumadin admitted on 05/26/2017 with chest pain. He was found to be hypotensive. Chest x-ray was suggestive of pneumonia. Patient has dysphagia and family insistent upon feeding him.   PMT consulted for goals of care and poor prognosis.  Clinical Assessment and Goals of Care: I have reviewed medical records including EPIC notes, labs and imaging, assessed the patient and then met at the bedside along with patient's daughter and other family members  to discuss diagnosis prognosis, [place], EOL wishes, disposition and options.  Of note, patient speaks [place] but daughter speaks [place].  Patient minimally responsive and unable to participate in [place] conversations.  I introduced Palliative Medicine as specialized medical care for people living with serious illness. It focuses on providing relief from the symptoms and stress of a serious illness. The goal is to improve quality of life for both the patient and the family. Daughter asked me if I was there to "help him die". I explained that I was there to assist with decision making and my specialty focuses on quality of life.   As far as functional and nutritional status, daughter tells me patient has been non-ambulatory for months. Unable to bathe or toilet himself. Daughter cares for patient. She tells me he is still eating well, but she feeds him. She tells me that sometimes he does not recognize her and is very forgetful.   We discussed his current illness and what it means in the larger context of his on-going  co-morbidities. Shared my concerns about patient's prognosis and that he may be near the end of life. Daughter tells me she feels like her father is getting better.   She tells me multiple times "I want him to get well". We discussed continuing antibiotics and IV fluids but if he decompensates despite our interventions that we may need to shift our focus to his comfort.     We discussed risks of feeding patient with dysphagia - she wants him to eat. Wants to see speech pathology. I explained patient may not be alert enough to participate in SLP evaluation.   Hospice and Palliative Care services outpatient were explained and offered.They have had palliative services at home before, but daughter discontinued them because she "wasn't ready for that". I explained that if he did not get better, hospice support at home may be appropriate.  Conversation ended abruptly when the daughter answered her phone and told me she did not have anymore questions from me. She agreed to follow-up tomorrow.   Primary Decision Maker NEXT OF KIN, daughter [name]  SUMMARY OF RECOMMENDATIONS   -Continue current care - antibiotics and fluids -PMT will follow along, family is aware patient may be near the end of life and we may need to shift focus to comfort care -their goal is for him to get well and go home - introduced home w/hospice but they need time to see how he responds to current treatment -family eager for SLP evaluation  Code Status/Advance Care Planning:  DNR  Symptom Management:   Patient currently comfortable - no symptoms per family  Palliative Prophylaxis:  Aspiration, Bowel Regimen, Delirium Protocol, Frequent Pain Assessment and Oral Care  Additional Recommendations (Limitations, Scope, Preferences):  Initiate Comfort Feeding  Psycho-social/Spiritual:   Desire for further Chaplaincy support:no  Additional Recommendations: Education on Hospice  Prognosis:   Unable to  determine, depends on response to current therapy but would not be surprised if prognosis is days  Discharge Planning: To Be Determined      Primary Diagnoses: Present on Admission: . Aphasia . Benign essential HTN . Carotid artery disease ([place]) . Chest pain at rest . Chronic atrial fibrillation ([place]) . Chronic diastolic congestive heart failure ([place]) . Coronary artery disease . Hyponatremia . Sepsis ([place]) . Cystitis . CAP (community acquired pneumonia) . Acute respiratory failure ([place]) . COPD, group B, by GOLD 2017 classification ([place]) . Acute renal failure superimposed on stage 3 chronic kidney disease ([place]) . Hyperkalemia . AAA (abdominal aortic aneurysm) without rupture ([place])   I have reviewed the medical record, interviewed the patient and family, and examined the patient. The following aspects are pertinent.  Past Medical History:  Diagnosis Date  . AAA (abdominal aortic aneurysm) ([place])   . Anemia   . Asthma   . Carotid artery disease ([place])   . CHF (congestive heart failure) ([place])   . Coronary artery disease   . Depression   . Diabetes mellitus type 2 in nonobese (HCC)   . Gout   . H. pylori infection   . Hard of hearing   . Hiatal hernia   . Hyperplasia, prostate   . Hypertension   . PUD (peptic ulcer disease)   . Renal failure, acute ([place]) 11/08/2012   Social History   Socioeconomic History  . Marital status: Married    Spouse name: [name]  . Number of children: 21  . Years of education: 4th grade  . Highest education level: Not on file  Occupational History  . Occupation: retired  Scientific laboratory technician  . Financial resource strain: Not on file  . Food insecurity:    Worry: Not on file    Inability: Not on file  . Transportation needs:    Medical: Not on file    Non-medical: Not on file  Tobacco Use  . Smoking status: Former Smoker    Last attempt to quit: 02/17/2002    Years since quitting: 15.2  . Smokeless tobacco: Never Used  . Tobacco comment: Quit  seven years ago   Substance and Sexual Activity  . Alcohol use: No  . Drug use: No  . Sexual activity: Never  Lifestyle  . Physical activity:    Days per week: Not on file    Minutes per session: Not on file  . Stress: Not on file  Relationships  . Social connections:    Talks on phone: Not on file    Gets together: Not on file    Attends religious service: Not on file    Active member of club or organization: Not on file    Attends meetings of clubs or organizations: Not on file    Relationship status: Not on file  Other Topics Concern  . Not on file  Social History Narrative   Patient lives at home with his daughter   Patient is right handed    Patient drinks tea and coffee   Family History  Problem Relation Age of Onset  . Colon cancer Neg Hx    Scheduled Meds: . acetylcysteine  3 mL Nebulization Once  . arformoterol  15 mcg Nebulization BID  .  budesonide  0.5 mg Nebulization BID  . feeding supplement (ENSURE ENLIVE)  237 mL Oral BID BM  . ipratropium-albuterol  3 mL Nebulization TID  . scopolamine  1 patch Transdermal Q72H   Continuous Infusions: . piperacillin-tazobactam (ZOSYN)  IV Stopped (05/28/17 0553)  . vancomycin Stopped (05/28/17 0623)   PRN Meds:.acetaminophen **OR** acetaminophen, antiseptic oral rinse, bisacodyl, diphenhydrAMINE, glycopyrrolate **OR** glycopyrrolate **OR** glycopyrrolate, haloperidol **OR** haloperidol **OR** haloperidol lactate, LORazepam **OR** [DISCONTINUED] LORazepam **OR** LORazepam, ondansetron **OR** ondansetron (ZOFRAN) IV, ondansetron **OR** ondansetron (ZOFRAN) IV, polyvinyl alcohol Allergies  Allergen Reactions  . Levofloxacin Anaphylaxis and Other (See Comments)    Patient told his daughter that it made him "feel worse than he already did" (overall) Headache also (has refused to take anymore)  . Eliquis [Apixaban] Itching and Swelling  . Phenazopyridine Hcl Other (See Comments)    Unknown reaction  . Septra  [Sulfamethoxazole-Trimethoprim] Nausea And Vomiting and Other (See Comments)    GI Upset  . Latex Rash  . Tape Rash   Review of Systems  Unable to perform ROS: Mental status change    Physical Exam  Constitutional: He appears cachectic. He appears ill. No distress.  HENT:  Head: Normocephalic and atraumatic.  Cardiovascular: An irregular rhythm present.  Pulmonary/Chest: He has decreased breath sounds in the right lower field and the left lower field. He has wheezes in the right upper field and the left upper field.  Abdominal: Soft.  Musculoskeletal:       Right lower leg: He exhibits edema.       Left lower leg: He exhibits edema.  Neurological: He is disoriented.  Opens eyes and grimaces, does not speak  Skin: Skin is warm and dry.  Psychiatric: He is noncommunicative.    Vital Signs: BP 99/66 (BP Location: Left Arm)   Pulse (!) 106   Temp 98.3 F (36.8 C) (Oral)   Resp (!) 22   SpO2 97%  Pain Scale: 0-10 POSS *See Group Information*: S-Acceptable,Sleep, easy to arouse Pain Score: Asleep   SpO2: SpO2: 97 % O2 Device:SpO2: 97 % O2 Flow Rate: .O2 Flow Rate (L/min): 3 L/min  IO: Intake/output summary:   Intake/Output Summary (Last 24 hours) at 05/28/2017 1440 Last data filed at 05/28/2017 0553 Gross per 24 hour  Intake 670.83 ml  Output -  Net 670.83 ml    LBM:   Baseline Weight:   Most recent weight:       Palliative Assessment/Data: PPS 30%     Time In: 1400 Time Out: 1500 Time Total: 60 minutes Greater than 50%  of this time was spent counseling and coordinating care related to the above assessment and plan.  Juel Burrow, DNP, AGNP-C Palliative Medicine Team [phone]

## 2017-05-29 ENCOUNTER — Encounter (HOSPITAL_COMMUNITY): Payer: Self-pay | Admitting: General Practice

## 2017-05-29 ENCOUNTER — Other Ambulatory Visit: Payer: Self-pay

## 2017-05-29 DIAGNOSIS — I5032 Chronic diastolic (congestive) heart failure: Secondary | ICD-10-CM

## 2017-05-29 DIAGNOSIS — D631 Anemia in chronic kidney disease: Secondary | ICD-10-CM

## 2017-05-29 DIAGNOSIS — N189 Chronic kidney disease, unspecified: Secondary | ICD-10-CM

## 2017-05-29 LAB — BASIC METABOLIC PANEL
Anion gap: 5 (ref 5–15)
BUN: 25 mg/dL — ABNORMAL HIGH (ref 6–20)
CO2: 16 mmol/L — ABNORMAL LOW (ref 22–32)
Calcium: 7.4 mg/dL — ABNORMAL LOW (ref 8.9–10.3)
Chloride: 114 mmol/L — ABNORMAL HIGH (ref 101–111)
Creatinine, Ser: 1.69 mg/dL — ABNORMAL HIGH (ref 0.61–1.24)
GFR, EST AFRICAN AMERICAN: 40 mL/min — AB (ref >60)
GFR, EST NON AFRICAN AMERICAN: 34 mL/min — AB (ref >60)
GLUCOSE: 77 mg/dL (ref 65–99)
POTASSIUM: 4.1 mmol/L (ref 3.5–5.1)
Sodium: 135 mmol/L (ref 135–145)

## 2017-05-29 LAB — VITAMIN B12: Vitamin B-12: 1462 pg/mL — ABNORMAL HIGH (ref 180–914)

## 2017-05-29 LAB — ABO/RH: ABO/RH(D): B POS

## 2017-05-29 LAB — CBC
HCT: 21.8 % — ABNORMAL LOW (ref 39.0–52.0)
Hemoglobin: 6.7 g/dL — CL (ref 13.0–17.0)
MCH: 29.3 pg (ref 26.0–34.0)
MCHC: 30.7 g/dL (ref 30.0–36.0)
MCV: 95.2 fL (ref 78.0–100.0)
Platelets: 218 K/uL (ref 150–400)
RBC: 2.29 MIL/uL — ABNORMAL LOW (ref 4.22–5.81)
RDW: 20.6 % — ABNORMAL HIGH (ref 11.5–15.5)
WBC: 11.8 K/uL — ABNORMAL HIGH (ref 4.0–10.5)

## 2017-05-29 LAB — PROTIME-INR
INR: 3.19
Prothrombin Time: 32.4 seconds — ABNORMAL HIGH (ref 11.4–15.2)

## 2017-05-29 LAB — TSH: TSH: 6.571 u[IU]/mL — ABNORMAL HIGH (ref 0.350–4.500)

## 2017-05-29 LAB — PREPARE RBC (CROSSMATCH)

## 2017-05-29 LAB — IRON AND TIBC: Iron: 32 ug/dL — ABNORMAL LOW (ref 45–182)

## 2017-05-29 LAB — FERRITIN: Ferritin: 300 ng/mL (ref 24–336)

## 2017-05-29 LAB — MAGNESIUM: MAGNESIUM: 1.6 mg/dL — AB (ref 1.7–2.4)

## 2017-05-29 LAB — PROCALCITONIN: Procalcitonin: 0.51 ng/mL

## 2017-05-29 MED ORDER — SODIUM CHLORIDE 0.9 % IV SOLN
Freq: Once | INTRAVENOUS | Status: AC
  Administered 2017-05-29: 07:00:00 via INTRAVENOUS

## 2017-05-29 MED ORDER — SODIUM CHLORIDE 0.9 % IV SOLN
Freq: Once | INTRAVENOUS | Status: AC
  Administered 2017-05-29: 11:00:00 via INTRAVENOUS

## 2017-05-29 MED ORDER — PIPERACILLIN-TAZOBACTAM 3.375 G IVPB
3.3750 g | Freq: Three times a day (TID) | INTRAVENOUS | Status: DC
  Administered 2017-05-29 – 2017-06-02 (×12): 3.375 g via INTRAVENOUS
  Filled 2017-05-29 (×13): qty 50

## 2017-05-29 MED ORDER — RESOURCE THICKENUP CLEAR PO POWD
ORAL | Status: DC | PRN
  Filled 2017-05-29: qty 125

## 2017-05-29 MED ORDER — SODIUM CHLORIDE 0.9 % IV SOLN
500.0000 mg | INTRAVENOUS | Status: AC
  Administered 2017-05-29 – 2017-06-01 (×4): 500 mg via INTRAVENOUS
  Filled 2017-05-29 (×5): qty 500

## 2017-05-29 NOTE — Progress Notes (Signed)
Daily Progress Note   Patient Name: [name]       Date: 05/29/2017 DOB: [birth year]  Age: 84 y.o. MRN#: [id number] Attending Physician: Dimple Nanas, MD Primary Care Physician: [name], MD Admit Date: 05/26/2017  Reason for Consultation/Follow-up: Establishing goals of care and Hospice Evaluation  Subjective: Patient not speaking, opens eyes when spoken to. Daughter thinks he looks better.  Length of Stay: 3  Current Medications: Scheduled Meds:  . acetylcysteine  3 mL Nebulization Once  . arformoterol  15 mcg Nebulization BID  . budesonide  0.5 mg Nebulization BID  . feeding supplement (ENSURE ENLIVE)  237 mL Oral BID BM  . ipratropium-albuterol  3 mL Nebulization TID  . scopolamine  1 patch Transdermal Q72H    Continuous Infusions: . famotidine (PEPCID) IV Stopped (05/28/17 2153)  . piperacillin-tazobactam (ZOSYN)  IV    . vancomycin      PRN Meds: acetaminophen **OR** acetaminophen, antiseptic oral rinse, bisacodyl, diphenhydrAMINE, glycopyrrolate **OR** glycopyrrolate **OR** glycopyrrolate, haloperidol **OR** haloperidol **OR** haloperidol lactate, LORazepam **OR** [DISCONTINUED] LORazepam **OR** LORazepam, ondansetron **OR** ondansetron (ZOFRAN) IV, ondansetron **OR** ondansetron (ZOFRAN) IV, polyvinyl alcohol  Physical Exam  Constitutional: He appears ill.  frail  HENT:  Head: Normocephalic and atraumatic.  Cardiovascular: An irregular rhythm present.  Pulmonary/Chest: Accessory muscle usage present. He has decreased breath sounds in the right lower field and the left lower field. He has wheezes in the right upper field and the left upper field.  Abdominal: Soft. Bowel sounds are normal.  Musculoskeletal:       Right forearm: He exhibits edema.       Right  lower leg: He exhibits edema.       Left lower leg: He exhibits edema.  Neurological:  Opens eyes, grimaces, does not speak  Skin: Skin is warm and dry. He is not diaphoretic.            Vital Signs: BP 108/66   Pulse (!) 126   Temp 97.7 F (36.5 C) (Oral)   Resp (!) 30   SpO2 94%  SpO2: SpO2: 94 % O2 Device: O2 Device: Nasal Cannula O2 Flow Rate: O2 Flow Rate (L/min): 4 L/min  Intake/output summary:   Intake/Output Summary (Last 24 hours) at 05/29/2017 1030 Last data filed at 05/29/2017 5784 Gross per 24 hour  Intake 120 ml  Output -  Net 120 ml   LBM:   Baseline Weight:   Most recent weight:         Palliative Assessment/Data: PPS 30%    Flowsheet Rows     Most Recent Value  Intake Tab  Referral Department  Hospitalist  Unit at Time of Referral  Med/Surg Unit  Palliative Care Primary Diagnosis  Sepsis/Infectious Disease  Date Notified  05/27/17  Palliative Care Type  New Palliative care  Reason for referral  Clarify Goals of Care  Date of Admission  05/26/17  Date first seen by Palliative Care  05/28/17  # of days Palliative referral response time  1 Day(s)  # of days IP prior to Palliative referral  1  Clinical Assessment  Palliative Performance Scale Score  30%  Psychosocial & Spiritual Assessment  Palliative Care Outcomes  Patient/Family meeting held?  Yes  Who was at the meeting?  daughter  Palliative Care Outcomes  Clarified goals of care, Counseled regarding hospice, Provided psychosocial or spiritual support      Patient Active Problem List   Diagnosis Date Noted  . Goals of care, counseling/discussion   . Palliative care by specialist   . Cystitis 05/26/2017  . Hyperkalemia 05/26/2017  . 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 Cerebral infarction due to embolism of cerebral artery (HCC)   . Chronic atrial fibrillation (HCC)   . History of CVA (cerebrovascular accident)   . Dysphagia, post-stroke   . Chronic diastolic  congestive heart failure (HCC)   . Uncomplicated asthma   . History of gout   . Persistent atrial fibrillation (HCC)   . Aphasia   . HCAP (healthcare-associated pneumonia)   . SOB (shortness of breath)   . PAF (paroxysmal atrial fibrillation) (HCC)   . Diabetes mellitus type 2 in nonobese (HCC)   . Stage 3 chronic kidney disease (HCC)   . Tachypnea   . Benign essential HTN   . Hypokalemia   . Acute blood loss anemia   . Cerebral thrombosis with cerebral infarction 11/27/2016  . Stroke (cerebrum) (HCC) 11/27/2016  . Acute respiratory failure (HCC)   . Stenosis of left carotid artery   . Acute ischemic stroke (HCC) 10/18/2016  . Cerebral infarction due to stenosis of right middle cerebral artery (HCC) 10/10/2016  . CVA (cerebral vascular accident) (HCC) 07/18/2016  . Stroke-like symptoms 07/18/2016  . Stroke (HCC) 07/18/2016  . Anemia 07/18/2016  . Non-English speaking patient 07/18/2016  . COPD, group B, by GOLD 2017 classification (HCC)   . Sepsis (HCC) 10/03/2015  . Nausea, vomiting and diarrhea 10/03/2015  . CKD (chronic kidney disease), stage III (HCC) 07/27/2015  . Carotid artery disease (HCC) 07/27/2015  . CAP (community acquired pneumonia) 07/27/2015  . Hyponatremia 06/18/2014  . Paroxysmal atrial fibrillation (HCC) 11/16/2013  . Ischemic cardiomyopathy 11/16/2013  . Headache 11/16/2013  . Chronic anticoagulation 11/16/2013  . Neck pain on left side 11/16/2013  . Chest pain at rest 11/16/2013  . Coronary artery disease 10/14/2013  . Chronic systolic heart failure (HCC) 10/14/2013  . AAA (abdominal aortic aneurysm) without rupture (HCC) 09/08/2013  . Hyperlipidemia 06/08/2013  . History of stroke 05/02/2013  . Acute renal failure superimposed on stage 3 chronic kidney disease (HCC) 04/29/2013  . PVD (peripheral vascular disease) 3.5cm AAA Aug 2014 04/29/2013  . NSTEMI - ? type 2 - Troponin 0.63 04/28/2013  . Chronic bilateral lower abdominal pain 02/17/2013  .  Hypertension     Palliative Care Assessment & Plan   HPI: 84 y.o. male  with past medical history of T2DM, HTN, CAD, CHF, CVA, CKD3, AAA, COPD, and a fib on coumadin admitted on 05/26/2017 with chest pain. He was found to be hypotensive. Chest x-ray was suggestive of pneumonia. Patient has dysphagia and family insistent upon feeding him.   PMT consulted for goals of care and poor prognosis.  Assessment: Follow up meeting with daughter at patient's bedside. She tells me she thinks he is doing better. When I ask her what makes her feel like he is doing better, she cannot specify.   She is most concerned about him eating. We discussed careful hand feeding and risks of aspiration. She is aware of risks. She is hopeful for SLP evaluation - we discussed he may be too lethargic to participate.   Shared my concerns about patient's  prognosis and that he may be near the end of life. She wants to continue current treatment and take things one day at a time and see how he responds.   Recommendations/Plan: Continue current care - antibiotics and fluids, blood transfusion -PMT will follow along, family is aware patient may be near the end of life and we may need to shift focus to comfort care -their goal is for him to get well and go home - introduced home w/hospice but they need time to see how he responds to current treatment -family eager for SLP evaluation  Goals of Care and Additional Recommendations:  Limitations on Scope of Treatment: Initiate Comfort Feeding and No Artificial Feeding  Code Status:  DNR  Prognosis:   Unable to determine, depends on response to current therapy but would not be surprised if prognosis is days  Discharge Planning:  To Be Determined  Care plan was discussed with daughter, Dr. Nelson Chimes  Thank you for allowing the Palliative Medicine Team to assist in the care of this patient.   Time In: 1000 Time Out: 1030 Total Time 30 minutes Prolonged Time Billed  no         Greater than 50%  of this time was spent counseling and coordinating care related to the above assessment and plan.  Gerlean Ren, DNP, AGNP-C Palliative Medicine Team Team Phone # [phone]

## 2017-05-29 NOTE — Progress Notes (Signed)
Pharmacy Antibiotic Note  [name] is a 84 y.o. male admitted on 05/26/2017 with pneumonia.  Pharmacy has been consulted for vancomycin and Zosyn dosing. Note his SCr continues to improve. He isn't ambulatory at baseline, so his SCr is likely lower than would be expected. Will adjust his antibiotics for an estimated CrCl of 20-30 ml/min.  His INR remains >3 today.  He usually only receives Coumadin 1mg  on Saturdays.  Will check his INR on Saturday and assess if he is stable to receive his Coumadin dose.  Note his hemoglobin has decreased today.  Plan: Change Zosyn to 3.375g IV q8h extended infusion Change Vancomycin to 500mg  IV q24h Check BMET daily for now given changes in SCr Check Vancomycin trough at steady state Follow available micro data PT/INR with AM labs to assess Coumadin     Temp (24hrs), Avg:98.2 F (36.8 C), Min:97.7 F (36.5 C), Max:98.5 F (36.9 C)  Recent Labs  Lab 05/26/17 0430 05/26/17 0457 05/26/17 0752 05/28/17 0232 05/29/17 0213  WBC 16.8*  --   --  18.4* 11.8*  CREATININE 2.19*  --   --  1.90* 1.69*  LATICACIDVEN  --  1.97* 1.4  --   --     CrCl cannot be calculated (Unknown ideal weight.).    Allergies  Allergen Reactions  . Levofloxacin Anaphylaxis and Other (See Comments)    Patient told his daughter that it made him "feel worse than he already did" (overall) Headache also (has refused to take anymore)  . Eliquis [Apixaban] Itching and Swelling  . Phenazopyridine Hcl Other (See Comments)    Unknown reaction  . Septra [Sulfamethoxazole-Trimethoprim] Nausea And Vomiting and Other (See Comments)    GI Upset  . Latex Rash  . Tape Rash    Antimicrobials this admission: Vanc 4/16 >> Zosyn 4/16 >>  4/16 BCx >> ngtd 4/17 MRSA PCR >> positive  Thank you for allowing pharmacy to be a part of this patient's care.  Estella Husk, Pharm.D., BCPS, BCIDP Clinical Pharmacist Phone: [phone] or [phone] 05/29/2017, 8:29 AM

## 2017-05-29 NOTE — Care Management Important Message (Signed)
Important Message  Patient Details  Name: [name] MRN: [id number] Date of Birth: 04-26-1926   Medicare Important Message Given:  Yes    Leone Haven, RN 05/29/2017, 2:38 PM

## 2017-05-29 NOTE — Progress Notes (Signed)
I have assessed the patient throughout the shift and agree with [facility] charting.

## 2017-05-29 NOTE — Evaluation (Signed)
Clinical/Bedside Swallow Evaluation Patient Details  Name: [name] MRN: [id number] Date of Birth: April 22, 1926  Today's Date: 05/29/2017 Time: SLP Start Time (ACUTE ONLY): 1104 SLP Stop Time (ACUTE ONLY): 1133 SLP Time Calculation (min) (ACUTE ONLY): 29 min  Past Medical History:  Past Medical History:  Diagnosis Date  . AAA (abdominal aortic aneurysm) (HCC)   . Anemia   . Asthma   . Carotid artery disease (HCC)   . CHF (congestive heart failure) (HCC)   . Coronary artery disease   . Depression   . Diabetes mellitus type 2 in nonobese (HCC)   . Gout   . H. pylori infection   . Hard of hearing   . Hiatal hernia   . Hyperplasia, prostate   . Hypertension   . PUD (peptic ulcer disease)   . Renal failure, acute (HCC) 11/08/2012   Past Surgical History:  Past Surgical History:  Procedure Laterality Date  . CAROTID STENT INSERTION  2015  . CAROTID STENT INSERTION N/A 05/09/2013   Procedure: CAROTID STENT INSERTION;  Surgeon: Runell Gess, MD;  Location[facility] CATH LAB;  Service: Cardiovascular;  Laterality: N/A;  . ESOPHAGOGASTRODUODENOSCOPY  06/13/2008,12/31/12  . IR PERCUTANEOUS ART THROMBECTOMY/INFUSION INTRACRANIAL INC DIAG ANGIO  11/27/2016  . LEFT HEART CATHETERIZATION WITH CORONARY ANGIOGRAM N/A 05/04/2013   Procedure: LEFT HEART CATHETERIZATION WITH CORONARY ANGIOGRAM;  Surgeon: Peter M [place], MD;  Location: [facility] CATH LAB;  Service: Cardiovascular;  Laterality: N/A;  . RADIOLOGY WITH ANESTHESIA N/A 11/27/2016   Procedure: IR WITH ANESTHESIA;  Surgeon: Julieanne Cotton, MD;  Location: [facility] OR;  Service: Radiology;  Laterality: N/A;   HPI:  84 year old with a history of diabetes mellitus type 2, essential hypertension, coronary artery disease, unspecified CHF, multiple CVAs was brought to the hospital with complains of chest pain. Pt presents with chest pain, hypotension, and Chest x-ray was suggestive of possible community-acquired pneumonia. Last seen by SLP in October  2018, with most recent objective study revealing that pt had a functional oropharyngeal swallow. Oral phase mildly delayed, suspect a combination of cognitive and respiratory deficits contributing, with swallow triggering intermittently at the pyriform sinuses with thin liquids but with full airway protection. [name] reports pt still consuming pureed/blended consisitencies PTA.    Assessment / Plan / Recommendation Clinical Impression    Pt presents with limited oral acceptance this session secondary to lethargy. Pt consumed ice chips and thin liquid with suspected delayed swallow initiation, with thin liquid trials also revealing reduced awareness of bolus and audible wetness. [name], acting as interpreter, provided mod verbal cues for pt to initiate swallow. [name] reports that pt has been coughing more with water, but only intermittently with purees. Further, she reports that pt was more alert this AM during breakfast. Given observed s/s and pt's family reports and results of most recent MBS, recommend pt continue a Dysphagia 1 (puree) diet with Nectar thick liquids with aspiration precautions (pt is to be sitting upright, alert for intake, small bites/sips, stopping eating/drinking with increase in coughing). Education provided to family about increased risk of aspiration given pt's medical hx and current condition, importance of aspiration precautions, likely chronic nature of pt's dysphagia given recurrent PNA, and potential for aspiration despite use of thickened liquids. His daughter verbalized her understanding and is eager to keep him eating. SLP will follow up to determine pt's safety with current diet and provide further family education.     SLP Visit Diagnosis: Dysphagia, unspecified (R13.10)    Aspiration Risk  Moderate aspiration risk    Diet Recommendation Dysphagia 1 (Puree);Nectar-thick liquid   Liquid Administration via: Spoon Medication Administration:  Crushed with puree(as tolerated) Supervision: Full supervision/cueing for compensatory strategies;Staff to assist with self feeding;Comment(Family to assist with feeding) Compensations: Minimize environmental distractions;Slow rate;Small sips/bites Postural Changes: Seated upright at 90 degrees    Other  Recommendations Oral Care Recommendations: Oral care BID Other Recommendations: Order thickener from pharmacy;Prohibited food (jello, ice cream, thin soups)   Follow up Recommendations Other (comment)(tbd)      Frequency and Duration min 2x/week  2 weeks       Prognosis Prognosis for Safe Diet Advancement: Guarded Barriers to Reach Goals: Severity of deficits      Swallow Study   General HPI: 84 year old with a history of diabetes mellitus type 2, essential hypertension, coronary artery disease, unspecified CHF, multiple CVAs was brought to the hospital with complains of chest pain. Pt presents with chest pain, hypotension, and Chest x-ray was suggestive of possible community-acquired pneumonia. Last seen by SLP in October 2018, with most recent objective study revealing that pt had a functional oropharyngeal swallow. Oral phase mildly delayed, suspect a combination of cognitive and respiratory deficits contributing, with swallow triggering intermittently at the pyriform sinuses with thin liquids but with full airway protection. [name] reports pt still consuming pureed/blended consisitencies PTA.  Type of Study: Bedside Swallow Evaluation Previous Swallow Assessment: see HPI Diet Prior to this Study: Dysphagia 1 (puree);Thin liquids Temperature Spikes Noted: No Respiratory Status: Nasal cannula History of Recent Intubation: No Behavior/Cognition: Lethargic/Drowsy;Requires cueing Oral Cavity Assessment: Within Functional Limits Oral Care Completed by SLP: Yes Oral Cavity - Dentition: Edentulous Self-Feeding Abilities: Needs assist Patient Positioning: Upright in bed Baseline  Vocal Quality: Low vocal intensity(mimimal voicing this session) Volitional Swallow: Unable to elicit    Oral/Motor/Sensory Function Overall Oral Motor/Sensory Function: Other (comment)(pt's daughter reports pt has limited lingual dexterity) Facial Symmetry: Within Functional Limits   Ice Chips Ice chips: Impaired Presentation: Spoon Pharyngeal Phase Impairments: Suspected delayed Swallow   Thin Liquid Thin Liquid: Impaired Presentation: Spoon Oral Phase Impairments: Poor awareness of bolus;Reduced labial seal Oral Phase Functional Implications: Prolonged oral transit Pharyngeal  Phase Impairments: Suspected delayed Swallow;Wet Vocal Quality;Unable to trigger swallow    Nectar Thick Nectar Thick Liquid: Not tested   Honey Thick Honey Thick Liquid: Not tested   Puree Puree: Not tested   Solid   GO   Solid: Not tested       [place] Kali Deadwyler SLP Student Clinician  [place] Adeleine Pask 05/29/2017,11:47 AM

## 2017-05-29 NOTE — Progress Notes (Signed)
CRITICAL VALUE ALERT  Critical Value:  Hemoglobin 6.7  Date & Time Notied: 05/29/17 @ 0508  Provider Notified: Rana Snare, NP

## 2017-05-29 NOTE — Progress Notes (Signed)
PROGRESS NOTE    [name]  ERD:[id number] DOB: [birth year] DOA: 05/26/2017 PCP: [name], MD   Brief Narrative:   84 year old with a history of diabetes mellitus type 2, essential hypertension, coronary artery disease, unspecified CHF, multiple CVAs was brought to the hospital with complains of chest pain.  Patient was found to be hypotensive initially despite of getting IV fluids but family did not want any aggressive measures besides IV fluids and antibiotics.  Chest x-ray was suggestive of possible community-acquired pneumonia therefore started on IV antibiotics. During the admission I had an extensive discussion with the patient's family member regarding his chronic long-term poor prognosis given his comorbidities and advanced age.  Family insisted to feed him despite aspiration risk but also requested speech and swallow evaluation. [place] ongoing discussions with Palliative care.   Assessment & Plan:   Principal Problem:   Acute respiratory failure (HCC) Active Problems:   Acute renal failure superimposed on stage 3 chronic kidney disease (HCC)   AAA (abdominal aortic aneurysm) without rupture (HCC)   Coronary artery disease   Chest pain at rest   Hyponatremia   Carotid artery disease (HCC)   CAP (community acquired pneumonia)   Sepsis (HCC)   COPD, group B, by GOLD 2017 classification (HCC)   Aphasia   Benign essential HTN   Chronic atrial fibrillation (HCC)   Chronic diastolic congestive heart failure (HCC)   Cystitis   Hyperkalemia   Goals of care, counseling/discussion   Palliative care by specialist  Septic shock, improved Multifocal community-acquired pneumonia Acute hypoxic respiratory distress, 85% room air - Continue nebulizer treatments every 6 hours, in between as needed if necessary -Patient is currently on vancomycin and Zosyn day 3 -Supplemental oxygen as needed -Gentle IV fluids, maintain map above 65.  Previously family has refused central  line placement for pressors therefore will give IV fluids with caution.  Patient has poor pulmonary reserve -Continue Pulmicort -Plans for Speech and Swallow eval today. In the Meantime continue Comfort feeds. Daughter understands the risk of aspiration.  -We will check pro-calcitonin  Acute kidney injury on CKD stage III; improving -Likely from hypotension and prerenal in nature.  Patient is getting IV fluids, avoid nephrotoxic drugs.  Creatinine is trended down slightly -We will monitor creatinine daily and urine output  Anemia, of chronic disease and Dilutional  -Hemoglobin slightly trended down to 6.7 but no signs of gross bleeding.  Will transfuse 1 unit PRBC -Iron studies are pending  Hyponatremia, improved -Possibly secondary to dehydration.  Patient is getting gentle IV fluids.  Will check daily sodium.  Has improved from 125 to131 to 135  COPD -Patient is not acute flare therefore not getting any steroids but patient is getting nebulizer treatments as mentioned above  History of coronary artery disease Atrial fibrillation Abdominal aortic aneurysm without rupture -Home medications currently on hold due to low blood pressure. -Patient is on Coumadin at home, will have pharmacy manage.   Essential hypertension -Antihypertensives on hold  Moderate to severe protein calorie malnutrition -Due to his advanced age patient has very poor appetite. -Speech and swallow has been consulted -We will add boost to his diet  Appreciate Palliative care input   Patient has longterm poor prognosis, and I have explained to the family. They are aware of it, including the daughter and the Wife.   DVT prophylaxis: Patient is on Coumadin Code Status: DO NOT RESUSCITATE Family Communication: None at bedside Disposition Plan: Maintain inpatient stay  Consultants:   Palliative  care  Procedures:   None  Antimicrobials:   Vancomycin 4/16  Zosyn 4/16   Subjective: No acute events  overnight. He is awake this morning but unable to carry on any meaningful conversation therefore spoke to the family at the bedside   Review of Systems Difficult to obtain given her mentation  Objective: Vitals:   05/28/17 1900 05/28/17 2356 05/29/17 0743 05/29/17 0901  BP:  (!) 94/55 108/66   Pulse: (!) 104 78 (!) 126   Resp: 20  (!) 30   Temp:  98.5 F (36.9 C) 97.7 F (36.5 C)   TempSrc:  Oral Oral   SpO2: 97% 100% 90% 94%    Intake/Output Summary (Last 24 hours) at 05/29/2017 1032 Last data filed at 05/29/2017 1610 Gross per 24 hour  Intake 120 ml  Output -  Net 120 ml   There were no vitals filed for this visit.  Examination:  General exam: Appears calm and comfortable, elderly frail-appearing, he is nonverbal Respiratory system: Continues to have diffuse coarse BS.  Cardiovascular system: S1 & S2 heard, RRR. No JVD, murmurs, rubs, gallops or clicks. No pedal edema. Gastrointestinal system: Abdomen is nondistended, soft and nontender. No organomegaly or masses felt. Normal bowel sounds heard. Central nervous system: Unable to get full neuro exam but withdraws to pain Extremities: Unable to fully assess Skin: No rashes, lesions or ulcers Psychiatry: Poor judgment    Data Reviewed:   CBC: Recent Labs  Lab 05/26/17 0430 05/28/17 0232 05/29/17 0213  WBC 16.8* 18.4* 11.8*  NEUTROABS 12.1*  --   --   HGB 8.3* 7.2* 6.7*  HCT 26.0* 22.2* 21.8*  MCV 93.2 92.5 95.2  PLT 262 235 218   Basic Metabolic Panel: Recent Labs  Lab 05/26/17 0430 05/28/17 0232 05/29/17 0213  NA 125* 131* 135  K 5.8* 4.3 4.1  CL 102 110 114*  CO2 16* 15* 16*  GLUCOSE 87 114* 77  BUN 37* 30* 25*  CREATININE 2.19* 1.90* 1.69*  CALCIUM 7.4* 7.2* 7.4*  MG  --  1.5* 1.6*   GFR: CrCl cannot be calculated (Unknown ideal weight.). Liver Function Tests: Recent Labs  Lab 05/26/17 0430  AST 29  ALT 12*  ALKPHOS 135*  BILITOT 1.0  PROT 6.4*  ALBUMIN 1.4*   No results for  input(s): LIPASE, AMYLASE in the last 168 hours. No results for input(s): AMMONIA in the last 168 hours. Coagulation Profile: Recent Labs  Lab 05/26/17 0430 05/29/17 0213  INR 3.00 3.19   Cardiac Enzymes: No results for input(s): CKTOTAL, CKMB, CKMBINDEX, TROPONINI in the last 168 hours. BNP (last 3 results) No results for input(s): PROBNP in the last 8760 hours. HbA1C: No results for input(s): HGBA1C in the last 72 hours. CBG: No results for input(s): GLUCAP in the last 168 hours. Lipid Profile: No results for input(s): CHOL, HDL, LDLCALC, TRIG, CHOLHDL, LDLDIRECT in the last 72 hours. Thyroid Function Tests: Recent Labs    05/29/17 0234  TSH 6.571*   Anemia Panel: No results for input(s): VITAMINB12, FOLATE, FERRITIN, TIBC, IRON, RETICCTPCT in the last 72 hours. Sepsis Labs: Recent Labs  Lab 05/26/17 0457 05/26/17 0752  LATICACIDVEN 1.97* 1.4    Recent Results (from the past 240 hour(s))  Blood Culture (routine x 2)     Status: None (Preliminary result)   Collection Time: 05/26/17  4:30 AM  Result Value Ref Range Status   Specimen Description BLOOD RIGHT WRIST  Final   Special Requests   Final  BOTTLES DRAWN AEROBIC AND ANAEROBIC Blood Culture adequate volume   Culture   Final    NO GROWTH 2 DAYS Performed at [facility], [address] [place], [place] [address]    Report Status PENDING  Incomplete  Blood Culture (routine x 2)     Status: None (Preliminary result)   Collection Time: 05/26/17  5:00 AM  Result Value Ref Range Status   Specimen Description BLOOD RIGHT ARM  Final   Special Requests   Final    AEROBIC BOTTLE ONLY Blood Culture results may not be optimal due to an excessive volume of blood received in culture bottles   Culture   Final    NO GROWTH 2 DAYS Performed at [facility], [address] [place], [place] [address]    Report Status PENDING  Incomplete  MRSA PCR Screening     Status: Abnormal   Collection Time: 05/27/17  10:45 AM  Result Value Ref Range Status   MRSA by PCR POSITIVE (A) NEGATIVE Final    Comment:        The GeneXpert MRSA Assay (FDA approved for NASAL specimens only), is one component of a comprehensive MRSA colonization surveillance program. It is not intended to diagnose MRSA infection nor to guide or monitor treatment for MRSA infections. RESULT CALLED TO, READ BACK BY AND VERIFIED WITH: Alinda Deem RN 13:20 05/27/17 ([email or web address]) Performed at [facility], [address] [place], [place] [address]          Radiology Studies: No results found.      Scheduled Meds: . acetylcysteine  3 mL Nebulization Once  . arformoterol  15 mcg Nebulization BID  . budesonide  0.5 mg Nebulization BID  . feeding supplement (ENSURE ENLIVE)  237 mL Oral BID BM  . ipratropium-albuterol  3 mL Nebulization TID  . scopolamine  1 patch Transdermal Q72H   Continuous Infusions: . famotidine (PEPCID) IV Stopped (05/28/17 2153)  . piperacillin-tazobactam (ZOSYN)  IV    . vancomycin       LOS: 3 days    Time spent: 30 mins    Ankit Joline Maxcy, MD [facility] Pager [phone]   If 7PM-7AM, please contact night-coverage [email or web address] Password [facility] 05/29/2017, 10:32 AM

## 2017-05-30 ENCOUNTER — Inpatient Hospital Stay (HOSPITAL_COMMUNITY): Payer: Medicare Other

## 2017-05-30 DIAGNOSIS — R4701 Aphasia: Secondary | ICD-10-CM

## 2017-05-30 DIAGNOSIS — J690 Pneumonitis due to inhalation of food and vomit: Secondary | ICD-10-CM

## 2017-05-30 LAB — TYPE AND SCREEN
ABO/RH(D): B POS
Antibody Screen: NEGATIVE
Unit division: 0
Unit division: 0

## 2017-05-30 LAB — CBC
HCT: 30.5 % — ABNORMAL LOW (ref 39.0–52.0)
HEMOGLOBIN: 10 g/dL — AB (ref 13.0–17.0)
MCH: 29.6 pg (ref 26.0–34.0)
MCHC: 32.8 g/dL (ref 30.0–36.0)
MCV: 90.2 fL (ref 78.0–100.0)
Platelets: 212 10*3/uL (ref 150–400)
RBC: 3.38 MIL/uL — ABNORMAL LOW (ref 4.22–5.81)
RDW: 19.1 % — AB (ref 11.5–15.5)
WBC: 12.3 10*3/uL — AB (ref 4.0–10.5)

## 2017-05-30 LAB — MAGNESIUM: MAGNESIUM: 1.4 mg/dL — AB (ref 1.7–2.4)

## 2017-05-30 LAB — PROTIME-INR
INR: 1.92
PROTHROMBIN TIME: 21.8 s — AB (ref 11.4–15.2)

## 2017-05-30 LAB — BPAM RBC
Blood Product Expiration Date: 201905112359
Blood Product Expiration Date: 201905112359
ISSUE DATE / TIME: 201904191046
ISSUE DATE / TIME: 201904191358
UNIT TYPE AND RH: 7300
Unit Type and Rh: 7300

## 2017-05-30 LAB — BASIC METABOLIC PANEL
ANION GAP: 7 (ref 5–15)
BUN: 23 mg/dL — ABNORMAL HIGH (ref 6–20)
CALCIUM: 7.8 mg/dL — AB (ref 8.9–10.3)
CO2: 17 mmol/L — ABNORMAL LOW (ref 22–32)
Chloride: 114 mmol/L — ABNORMAL HIGH (ref 101–111)
Creatinine, Ser: 1.59 mg/dL — ABNORMAL HIGH (ref 0.61–1.24)
GFR calc Af Amer: 43 mL/min — ABNORMAL LOW (ref >60)
GFR, EST NON AFRICAN AMERICAN: 37 mL/min — AB (ref >60)
GLUCOSE: 109 mg/dL — AB (ref 65–99)
Potassium: 4.2 mmol/L (ref 3.5–5.1)
SODIUM: 138 mmol/L (ref 135–145)

## 2017-05-30 LAB — PROCALCITONIN: Procalcitonin: 0.36 ng/mL

## 2017-05-30 MED ORDER — WARFARIN - PHARMACIST DOSING INPATIENT: Freq: Every day | Status: DC

## 2017-05-30 MED ORDER — FUROSEMIDE 10 MG/ML IJ SOLN
40.0000 mg | Freq: Once | INTRAMUSCULAR | Status: AC
  Administered 2017-05-30: 40 mg via INTRAVENOUS
  Filled 2017-05-30: qty 4

## 2017-05-30 MED ORDER — MAGNESIUM SULFATE 2 GM/50ML IV SOLN
2.0000 g | Freq: Once | INTRAVENOUS | Status: AC
  Administered 2017-05-30: 2 g via INTRAVENOUS
  Filled 2017-05-30: qty 50

## 2017-05-30 MED ORDER — WARFARIN SODIUM 1 MG PO TABS: 1.0000 mg | ORAL_TABLET | Freq: Once | ORAL | Status: DC

## 2017-05-30 NOTE — Progress Notes (Signed)
ANTICOAGULATION CONSULT NOTE - Follow-Up Consult  Pharmacy Consult for warfarin Indication: atrial fibrillation  Allergies  Allergen Reactions  . Levofloxacin Anaphylaxis and Other (See Comments)    Patient told his daughter that it made him "feel worse than he already did" (overall) Headache also (has refused to take anymore)  . Eliquis [Apixaban] Itching and Swelling  . Phenazopyridine Hcl Other (See Comments)    Unknown reaction  . Septra [Sulfamethoxazole-Trimethoprim] Nausea And Vomiting and Other (See Comments)    GI Upset  . Latex Rash  . Tape Rash     Vital Signs: Temp: 97.8 F (36.6 C) (04/20 0800) Temp Source: Oral (04/20 0800) BP: 112/69 (04/20 0800) Pulse Rate: 88 (04/20 1400)  Labs: Recent Labs    05/28/17 0232 05/29/17 0213 05/30/17 0221  HGB 7.2* 6.7* 10.0*  HCT 22.2* 21.8* 30.5*  PLT 235 218 212  LABPROT  --  32.4* 21.8*  INR  --  3.19 1.92  CREATININE 1.90* 1.69* 1.59*     Assessment: On warfarin PTA for AFib. Per outpatient notes, INR goal 2.5-3.5. Admit INR 3 on 4/16.  Daughter is knowledgeable about warfarin and explained unusual dosing with regards to sub/supratherapeutic INR as an outpatient.   PTA warfarin: 1 mg qSaturdays, no warfarin otherwise  No INR has been checked on 4/17 or 4/18 - originally d/ced as it was thought comfort measures might be pursued. INR today 1.92. Will check INR tomorrow AM. Next PTA dose would be due on 4/27.   CBC stable, no bleeding reported.   Goal of Therapy:  INR 2.5-3.5 Monitor platelets by anticoagulation protocol: Yes   Plan:  - Warfarin 1 mg po x 1 today - Follow-up INR in the AM - Follow-up palliative/GOC   Adline Potter, PharmD Pharmacy Resident Pager: [phone]

## 2017-05-30 NOTE — Progress Notes (Signed)
SLP Cancellation Note  Patient Details Name: [name] MRN: [id number] DOB: Mar 27, 1926   Cancelled treatment:       Reason Eval/Treat Not Completed: Fatigue/lethargy limiting ability to participate;Patient's level of consciousness   Angela Nevin, MA, CCC-SLP 05/30/17 1:38 PM

## 2017-05-30 NOTE — Progress Notes (Addendum)
PROGRESS NOTE    [name]  VOZ:[id number] DOB: August 13, 1926 DOA: 05/26/2017 PCP: [name], MD   Brief Narrative:   84 year old with a history of diabetes mellitus type 2, essential hypertension, coronary artery disease, unspecified CHF, multiple CVAs was brought to the hospital with complains of chest pain.  Patient was found to be hypotensive initially despite of getting IV fluids but family did not want any aggressive measures besides IV fluids and antibiotics.  Chest x-ray was suggestive of possible community-acquired pneumonia therefore started on IV antibiotics. During the admission I had an extensive discussion with the patient's family member regarding his chronic long-term poor prognosis given his comorbidities and advanced age.  Family insisted to feed him despite aspiration risk but also requested speech and swallow evaluation. [place] ongoing discussions with Palliative care.   Assessment & Plan:   Principal Problem:   Acute respiratory failure (HCC) Active Problems:   Acute renal failure superimposed on stage 3 chronic kidney disease (HCC)   AAA (abdominal aortic aneurysm) without rupture (HCC)   Coronary artery disease   Chest pain at rest   Hyponatremia   Carotid artery disease (HCC)   CAP (community acquired pneumonia)   Sepsis (HCC)   COPD, group B, by GOLD 2017 classification (HCC)   Aphasia   Benign essential HTN   Chronic atrial fibrillation (HCC)   Chronic diastolic congestive heart failure (HCC)   Cystitis   Hyperkalemia   Goals of care, counseling/discussion   Palliative care by specialist  Septic shock, improved Multifocal community-acquired pneumonia Acute hypoxic respiratory distress, 95% on 3L [place] - Continue nebulizer treatments every 6 hours, in between as needed if necessary -Patient is currently on vancomycin and Zosyn day 4 -Supplemental oxygen as needed -Gentle IV fluids, maintain map above 65.  Previously family has refused central  line placement for pressors therefore will give IV fluids with caution.  Patient has poor pulmonary reserve -Continue Pulmicort -Speech and swallow recommends moderate aspiration risks/dysphagia 1 diet -Pro-calcitonin 0.3-chest x-ray shows slightly worsening infiltrate concerning for possible edema. -CXR showed worsening infiltrate concerning for edema therefore Lasix 40mg  IV once ordered.   Acute kidney injury on CKD stage III; improving -Likely from hypotension and prerenal in nature.  Patient is getting IV fluids, avoid nephrotoxic drugs.  Creatinine is trended down slightly -We will monitor creatinine daily and urine output  Anemia, of chronic disease and Dilutional  -.  Received 1 unit of PRBC on 4/19.  Hemoglobin improved to 10 -Iron studies showed ferritin 300, TIBC and saturation not calculated, B12 greater than 1000 -Folate pending  Hyponatremia, resolved  COPD -Patient is not acute flare therefore not getting any steroids but patient is getting nebulizer treatments as mentioned above  History of coronary artery disease Atrial fibrillation Abdominal aortic aneurysm without rupture -Home medications currently on hold due to low blood pressure. -Patient is on Coumadin at home, will have pharmacy manage.   Essential hypertension -Antihypertensives on hold  Severe protein calorie malnutrition -Due to his advanced age patient has very poor appetite. -We will add boost to his diet  Appreciate palliative care team input  DVT prophylaxis: Patient is on Coumadin Code Status: DO NOT RESUSCITATE Family Communication: None at bedside Disposition Plan: Maintain inpatient stay, once his oxygen requirement is improved we will start transitioning home.  Consultants:   Palliative care  Procedures:   None  Antimicrobials:   Vancomycin 4/16  Zosyn 4/16   Subjective: No acute events overnight.  This morning patient remains  on 2 L of nasal cannula.  Review of  Systems Unable to obtain due to his mentation  Objective: Vitals:   05/30/17 0006 05/30/17 0324 05/30/17 0745 05/30/17 0800  BP: 125/74   112/69  Pulse: 98   (!) 108  Resp:      Temp: 97.9 F (36.6 C)   97.8 F (36.6 C)  TempSrc: Oral   Oral  SpO2: 100%  96%   Weight:  61.2 kg (134 lb 14.7 oz)    Height:  5\' 3"  (1.6 m)      Intake/Output Summary (Last 24 hours) at 05/30/2017 1235 Last data filed at 05/30/2017 0904 Gross per 24 hour  Intake 8940.92 ml  Output -  Net 8940.92 ml   Filed Weights   05/30/17 0324  Weight: 61.2 kg (134 lb 14.7 oz)    Examination:  General exam: He is calm and comfortable laying in the bed, using 3 L of nasal cannula oxygen.  He is unresponsive to verbal stimuli, does withdraw with painful stimuli.  Does not follow commands Respiratory system: Continues to have diffuse coarse BS.  Cardiovascular system: S1 & S2 heard, RRR. No JVD, murmurs, rubs, gallops or clicks. No pedal edema. Gastrointestinal system: Abdomen is nondistended, softNo organomegaly or masses felt. Normal bowel sounds heard. Central nervous system: Unable to get full neuro exam but withdraws to pain Extremities: Unable to fully assess Skin: No rashes, lesions or ulcers Psychiatry: Poor judgment    Data Reviewed:   CBC: Recent Labs  Lab 05/26/17 0430 05/28/17 0232 05/29/17 0213 05/30/17 0221  WBC 16.8* 18.4* 11.8* 12.3*  NEUTROABS 12.1*  --   --   --   HGB 8.3* 7.2* 6.7* 10.0*  HCT 26.0* 22.2* 21.8* 30.5*  MCV 93.2 92.5 95.2 90.2  PLT 262 235 218 212   Basic Metabolic Panel: Recent Labs  Lab 05/26/17 0430 05/28/17 0232 05/29/17 0213 05/30/17 0221  NA 125* 131* 135 138  K 5.8* 4.3 4.1 4.2  CL 102 110 114* 114*  CO2 16* 15* 16* 17*  GLUCOSE 87 114* 77 109*  BUN 37* 30* 25* 23*  CREATININE 2.19* 1.90* 1.69* 1.59*  CALCIUM 7.4* 7.2* 7.4* 7.8*  MG  --  1.5* 1.6* 1.4*   GFR: Estimated Creatinine Clearance: 25.8 mL/min (A) (by C-G formula based on SCr of 1.59  mg/dL (H)). Liver Function Tests: Recent Labs  Lab 05/26/17 0430  AST 29  ALT 12*  ALKPHOS 135*  BILITOT 1.0  PROT 6.4*  ALBUMIN 1.4*   No results for input(s): LIPASE, AMYLASE in the last 168 hours. No results for input(s): AMMONIA in the last 168 hours. Coagulation Profile: Recent Labs  Lab 05/26/17 0430 05/29/17 0213 05/30/17 0221  INR 3.00 3.19 1.92   Cardiac Enzymes: No results for input(s): CKTOTAL, CKMB, CKMBINDEX, TROPONINI in the last 168 hours. BNP (last 3 results) No results for input(s): PROBNP in the last 8760 hours. HbA1C: No results for input(s): HGBA1C in the last 72 hours. CBG: No results for input(s): GLUCAP in the last 168 hours. Lipid Profile: No results for input(s): CHOL, HDL, LDLCALC, TRIG, CHOLHDL, LDLDIRECT in the last 72 hours. Thyroid Function Tests: Recent Labs    05/29/17 0234  TSH 6.571*   Anemia Panel: Recent Labs    05/29/17 0213  VITAMINB12 1,462*  FERRITIN 300  TIBC NOT CALCULATED  IRON 32*   Sepsis Labs: Recent Labs  Lab 05/26/17 0457 05/26/17 0752 05/29/17 1056 05/30/17 0221  PROCALCITON  --   --  0.51 0.36  LATICACIDVEN 1.97* 1.4  --   --     Recent Results (from the past 240 hour(s))  Blood Culture (routine x 2)     Status: None (Preliminary result)   Collection Time: 05/26/17  4:30 AM  Result Value Ref Range Status   Specimen Description BLOOD RIGHT WRIST  Final   Special Requests   Final    BOTTLES DRAWN AEROBIC AND ANAEROBIC Blood Culture adequate volume   Culture   Final    NO GROWTH 3 DAYS Performed at [facility], [address] [place], [place] [address]    Report Status PENDING  Incomplete  Blood Culture (routine x 2)     Status: None (Preliminary result)   Collection Time: 05/26/17  5:00 AM  Result Value Ref Range Status   Specimen Description BLOOD RIGHT ARM  Final   Special Requests   Final    AEROBIC BOTTLE ONLY Blood Culture results may not be optimal due to an excessive volume of  blood received in culture bottles   Culture   Final    NO GROWTH 3 DAYS Performed at [facility], [address], [place], [place] [address]    Report Status PENDING  Incomplete  MRSA PCR Screening     Status: Abnormal   Collection Time: 05/27/17 10:45 AM  Result Value Ref Range Status   MRSA by PCR POSITIVE (A) NEGATIVE Final    Comment:        The GeneXpert MRSA Assay (FDA approved for NASAL specimens only), is one component of a comprehensive MRSA colonization surveillance program. It is not intended to diagnose MRSA infection nor to guide or monitor treatment for MRSA infections. RESULT CALLED TO, READ BACK BY AND VERIFIED WITH: Alinda Deem RN 13:20 05/27/17 ([email or web address]) Performed at [facility], [address], [place], [place] [address]          Radiology Studies: Dg Chest Port 1 View  Result Date: 05/30/2017 CLINICAL DATA:  Dyspnea,shob with chest pain,,used 2 pillows to get patient straight,could not follow directions EXAM: PORTABLE CHEST - 1 VIEW COMPARISON:  05/26/2017 FINDINGS: Progressive bibasilar airspace disease with more dense consolidation at the left lung base. Worsening interstitial infiltrates or edema predominately perihilar. Probable pleural effusions left greater than right. Heart size upper limits normal for technique. Aortic Atherosclerosis (ICD10-170.0) No pneumothorax. Visualized bones unremarkable. IMPRESSION: 1. Worsening asymmetric infiltrates or edema with small effusions. Electronically Signed   By: Corlis Leak M.D.   On: 05/30/2017 09:38        Scheduled Meds: . acetylcysteine  3 mL Nebulization Once  . arformoterol  15 mcg Nebulization BID  . budesonide  0.5 mg Nebulization BID  . feeding supplement (ENSURE ENLIVE)  237 mL Oral BID BM  . ipratropium-albuterol  3 mL Nebulization TID  . scopolamine  1 patch Transdermal Q72H   Continuous Infusions: . famotidine (PEPCID) IV Stopped (05/29/17 2206)  . magnesium sulfate 1 - 4 g bolus  IVPB 2 g (05/30/17 1137)  . piperacillin-tazobactam (ZOSYN)  IV Stopped (05/30/17 1033)  . vancomycin 500 mg (05/30/17 1137)     LOS: 4 days    Time spent: 20 mins    Tamim Skog Joline Maxcy, MD [facility] Pager [phone]   If 7PM-7AM, please contact night-coverage [email or web address] Password [facility] 05/30/2017, 12:35 PM

## 2017-05-30 NOTE — Progress Notes (Signed)
Pt with new orders for Coumadin PO.  Spoke to Dr. Nelson Chimes regarding concerns of pt's mental status and aspiration risk.  Pt has slept most of the day, awakened at times by family for very short periods.  Family fed pt lunch and noted increased congestion and WOB following eating.  MD stated ok to hold PO coumadin and had conversation with family regarding his aspiration risk and mental status.    1800- noting less WOB and congestion but given aspiration risk, cough, and questionable mental status,  held Coumadin.  Charge nurse aware as well.   55 - notified on-call MD that Coumadin was held.  No new orders received.

## 2017-05-30 NOTE — Plan of Care (Signed)
Pt is maintaining good oxygen saturations but showing increased WOB at times, following meal.  Increased congestion.  Poor PO intake earlier today.  Did eat some of his dinner with assistance from daughter.

## 2017-05-31 LAB — CBC
HCT: 31.6 % — ABNORMAL LOW (ref 39.0–52.0)
HEMOGLOBIN: 10.1 g/dL — AB (ref 13.0–17.0)
MCH: 29.8 pg (ref 26.0–34.0)
MCHC: 32 g/dL (ref 30.0–36.0)
MCV: 93.2 fL (ref 78.0–100.0)
Platelets: 173 10*3/uL (ref 150–400)
RBC: 3.39 MIL/uL — ABNORMAL LOW (ref 4.22–5.81)
RDW: 19.5 % — AB (ref 11.5–15.5)
WBC: 19.1 10*3/uL — ABNORMAL HIGH (ref 4.0–10.5)

## 2017-05-31 LAB — CULTURE, BLOOD (ROUTINE X 2)
Culture: NO GROWTH
Culture: NO GROWTH
Special Requests: ADEQUATE

## 2017-05-31 LAB — PROCALCITONIN: PROCALCITONIN: 0.52 ng/mL

## 2017-05-31 LAB — BASIC METABOLIC PANEL
Anion gap: 9 (ref 5–15)
BUN: 26 mg/dL — AB (ref 6–20)
CHLORIDE: 115 mmol/L — AB (ref 101–111)
CO2: 15 mmol/L — AB (ref 22–32)
CREATININE: 1.73 mg/dL — AB (ref 0.61–1.24)
Calcium: 7.8 mg/dL — ABNORMAL LOW (ref 8.9–10.3)
GFR calc Af Amer: 39 mL/min — ABNORMAL LOW (ref >60)
GFR calc non Af Amer: 33 mL/min — ABNORMAL LOW (ref >60)
Glucose, Bld: 130 mg/dL — ABNORMAL HIGH (ref 65–99)
Potassium: 4.3 mmol/L (ref 3.5–5.1)
SODIUM: 139 mmol/L (ref 135–145)

## 2017-05-31 LAB — GLUCOSE, CAPILLARY: Glucose-Capillary: 117 mg/dL — ABNORMAL HIGH (ref 65–99)

## 2017-05-31 LAB — MAGNESIUM: MAGNESIUM: 1.8 mg/dL (ref 1.7–2.4)

## 2017-05-31 LAB — VANCOMYCIN, TROUGH: Vancomycin Tr: 17 ug/mL (ref 15–20)

## 2017-05-31 LAB — PROTIME-INR
INR: 1.8
Prothrombin Time: 20.7 seconds — ABNORMAL HIGH (ref 11.4–15.2)

## 2017-05-31 MED ORDER — WARFARIN SODIUM 1 MG PO TABS
1.0000 mg | ORAL_TABLET | Freq: Once | ORAL | Status: AC
  Administered 2017-05-31: 1 mg via ORAL
  Filled 2017-05-31: qty 1

## 2017-05-31 MED ORDER — SODIUM CHLORIDE 0.9 % IV BOLUS
250.0000 mL | Freq: Once | INTRAVENOUS | Status: AC
  Administered 2017-05-31: 250 mL via INTRAVENOUS

## 2017-05-31 MED ORDER — FUROSEMIDE 10 MG/ML IJ SOLN
40.0000 mg | Freq: Four times a day (QID) | INTRAMUSCULAR | Status: AC
  Administered 2017-05-31: 40 mg via INTRAVENOUS
  Filled 2017-05-31 (×2): qty 4

## 2017-05-31 NOTE — Progress Notes (Signed)
Pharmacy Antibiotic Note/Coumadin note  [name] is a 84 y.o. male admitted on 05/26/2017 with pneumonia.  Pharmacy has been consulted for vancomycin and Zosyn dosing.  Vancomycin trough this morning was 17, level drawn early. Patient's last dose was given 1.5 hour late. Vanc trough closer to 15 mcg/ml based on pharmacokinetic estimates. Will not change dose at this time.  SCr trending down. He isn't ambulatory at baseline, so his SCr is likely lower than would be expected.   INR 1.8 this AM. Dose was held last night per MD due to poor mentation and concern for aspiration. Per RN, all po meds are being held at this time. Pt usually only receives Coumadin 1mg  on Saturdays. Coumadin dose still entered to be given if able.   Plan: Continue Zosyn to 3.375g IV q8h extended infusion Continue Vancomycin to 500mg  IV q24h Check BMET daily for now given changes in SCr Check Vancomycin trough as appropriate PT/INR with AM labs to assess Coumadin  Height: 5\' 3"  (160 cm)(estimated by family) Weight: 134 lb 14.7 oz (61.2 kg) IBW/kg (Calculated) : 56.9  Temp (24hrs), Avg:98.1 F (36.7 C), Min:97.9 F (36.6 C), Max:98.3 F (36.8 C)  Recent Labs  Lab 05/26/17 0430 05/26/17 0457 05/26/17 0752 05/28/17 0232 05/29/17 0213 05/30/17 0221 05/31/17 0806 05/31/17 0812  WBC 16.8*  --   --  18.4* 11.8* 12.3*  --  19.1*  CREATININE 2.19*  --   --  1.90* 1.69* 1.59* 1.73*  --   LATICACIDVEN  --  1.97* 1.4  --   --   --   --   --   VANCOTROUGH  --   --   --   --   --   --  17  --     Estimated Creatinine Clearance: 23.8 mL/min (A) (by C-G formula based on SCr of 1.73 mg/dL (H)).    Allergies  Allergen Reactions  . Levofloxacin Anaphylaxis and Other (See Comments)    Patient told his daughter that it made him "feel worse than he already did" (overall) Headache also (has refused to take anymore)  . Eliquis [Apixaban] Itching and Swelling  . Phenazopyridine Hcl Other (See Comments)   Unknown reaction  . Septra [Sulfamethoxazole-Trimethoprim] Nausea And Vomiting and Other (See Comments)    GI Upset  . Latex Rash  . Tape Rash    Antimicrobials this admission: Vanc 4/16 >> Zosyn 4/16 >>  4/16 BCx >> ngtd 4/17 MRSA PCR >> positive  Adline Potter, PharmD Pharmacy Resident Pager: [phone]

## 2017-05-31 NOTE — Progress Notes (Signed)
Notified by patients family that he was harder to arouse. Went into room and patients was able to open eyes to physical stimulation. Took vital signs and blood pressure was 88/57. MD paged and instructed to give 250cc bolus. Will continue to monitor.

## 2017-05-31 NOTE — Progress Notes (Signed)
PROGRESS NOTE    [name]  ZOX:[id number] DOB: 09-02-1926 DOA: 05/26/2017 PCP: [name], MD   Brief Narrative:   84 year old with a history of diabetes mellitus type 2, essential hypertension, coronary artery disease, unspecified CHF, multiple CVAs was brought to the hospital with complains of chest pain.  Patient was found to be hypotensive initially despite of getting IV fluids but family did not want any aggressive measures besides IV fluids and antibiotics.  Chest x-ray was suggestive of possible community-acquired pneumonia therefore started on IV antibiotics. During the admission I had an extensive discussion with the patient's family member regarding his chronic long-term poor prognosis given his comorbidities and advanced age.  Family insisted to feed him despite aspiration risk but also requested speech and swallow evaluation. Currently ongoing discussions with Palliative care.   Assessment & Plan:   Principal Problem:   Acute respiratory failure (HCC) Active Problems:   Acute renal failure superimposed on stage 3 chronic kidney disease (HCC)   AAA (abdominal aortic aneurysm) without rupture (HCC)   Coronary artery disease   Chest pain at rest   Hyponatremia   Carotid artery disease (HCC)   CAP (community acquired pneumonia)   Sepsis (HCC)   COPD, group B, by GOLD 2017 classification (HCC)   Aphasia   Benign essential HTN   Chronic atrial fibrillation (HCC)   Chronic diastolic congestive heart failure (HCC)   Cystitis   Hyperkalemia   Goals of care, counseling/discussion   Palliative care by specialist  Septic shock, improved Multifocal community-acquired pneumonia Acute hypoxic respiratory distress, 95% on 3L [place] - Continue nebulizer treatments every 6 hours, in between as needed if necessary -Patient is currently on vancomycin and Zosyn day 5 -Supplemental oxygen as needed -+11L, will give Lasix 40mg  IV x2.  -Continue Pulmicort -Speech and swallow  recommends moderate aspiration risks/dysphagia 1 diet -Pro-calcitonin 0.3-chest x-ray shows slightly worsening infiltrate concerning for possible edema. -CXR showed worsening infiltrate concerning for edema therefore Lasix 40mg  IV once ordered.   Acute kidney injury on CKD stage III -I believe this has plateued around 1.5-1.7 -We will monitor creatinine daily and urine output  Anemia, of chronic disease and Dilutional  -.  Received 1 unit of PRBC on 4/19.  Hemoglobin improved to 10 -Iron studies showed ferritin 300, TIBC and saturation not calculated, B12 greater than 1000 -Folate pending  Hyponatremia, resolved  COPD -Patient is not acute flare therefore not getting any steroids but patient is getting nebulizer treatments as mentioned above  History of coronary artery disease Atrial fibrillation Abdominal aortic aneurysm without rupture -Home medications currently on hold due to low blood pressure. -Patient is on Coumadin at home, will have pharmacy manage.   Essential hypertension -Antihypertensives on hold  Severe protein calorie malnutrition -Due to his advanced age patient has very poor appetite. -We will add boost to his diet  Appreciate palliative care team input. Despite of my prolonged discussions about patients aspiration, family continues to feed him even while he is not mentating well. They continue to ask the staff to give him his medications, but I have advised the staff and the family that we will not administer any food or medications orally if patient is awake enough to safe have oral intake. I had informed that this is against standard of medical practice and care.   DVT prophylaxis: Patient is on Coumadin only if alert enough for PO intake.  Code Status: DO NOT RESUSCITATE Family Communication: Son at bedside.  Disposition Plan: Maintain  inpatient stay, once his oxygen requirement is improved we will start transitioning home.  Consultants:   Palliative  care  Procedures:   None  Antimicrobials:   Vancomycin 4/16  Zosyn 4/16   Subjective: Patient was not awake and alert enough to have his oral medications yesterday with family insisted patient get medications.  I have informed them this is against standard of medical practice for physicians and nurses therefore and will be not be administered by [place] due to high risk of aspiration.  Daughter still went ahead and gave the medication herself to the patient.  This morning of spoken with the patient's son who is at bedside in terms of overall prognosis of his father and likely overall decline in his condition as there is no permanent cure for his illness.  I also recommended to him patient would benefit from hospice/home hospice.  He will speak with the patient's daughter and the wife regarding this.  Review of Systems Unable to obtain due to his mentation  Objective: Vitals:   05/30/17 2012 05/30/17 2352 05/31/17 0754 05/31/17 0813  BP:  123/85  118/78  Pulse:  (!) 125  (!) 118  Resp:    (!) 21  Temp:  98.3 F (36.8 C)  98 F (36.7 C)  TempSrc:  Oral  Axillary  SpO2: 94% 98% 94% 95%  Weight:      Height:        Intake/Output Summary (Last 24 hours) at 05/31/2017 1250 Last data filed at 05/31/2017 0900 Gross per 24 hour  Intake 250 ml  Output 525 ml  Net -275 ml   Filed Weights   05/30/17 0324  Weight: 61.2 kg (134 lb 14.7 oz)    Examination:  General exam: Patient is calm but not awake alert laying in the bed, intermittently coughing.  On 3 L nasal cannula Respiratory system: Very diffuse coarse breath sounds Cardiovascular system: S1 & S2 heard, RRR. No JVD, murmurs, rubs, gallops or clicks. No pedal edema. Gastrointestinal system: Abdomen is nondistended, softNo organomegaly or masses felt. Normal bowel sounds heard. Central nervous system: Unable to get full neuro exam but withdraws to pain Extremities: Unable to fully assess Skin: No rashes, lesions or  ulcers Psychiatry: Poor judgment    Data Reviewed:   CBC: Recent Labs  Lab 05/26/17 0430 05/28/17 0232 05/29/17 0213 05/30/17 0221 05/31/17 0812  WBC 16.8* 18.4* 11.8* 12.3* 19.1*  NEUTROABS 12.1*  --   --   --   --   HGB 8.3* 7.2* 6.7* 10.0* 10.1*  HCT 26.0* 22.2* 21.8* 30.5* 31.6*  MCV 93.2 92.5 95.2 90.2 93.2  PLT 262 235 218 212 173   Basic Metabolic Panel: Recent Labs  Lab 05/26/17 0430 05/28/17 0232 05/29/17 0213 05/30/17 0221 05/31/17 0806  NA 125* 131* 135 138 139  K 5.8* 4.3 4.1 4.2 4.3  CL 102 110 114* 114* 115*  CO2 16* 15* 16* 17* 15*  GLUCOSE 87 114* 77 109* 130*  BUN 37* 30* 25* 23* 26*  CREATININE 2.19* 1.90* 1.69* 1.59* 1.73*  CALCIUM 7.4* 7.2* 7.4* 7.8* 7.8*  MG  --  1.5* 1.6* 1.4* 1.8   GFR: Estimated Creatinine Clearance: 23.8 mL/min (A) (by C-G formula based on SCr of 1.73 mg/dL (H)). Liver Function Tests: Recent Labs  Lab 05/26/17 0430  AST 29  ALT 12*  ALKPHOS 135*  BILITOT 1.0  PROT 6.4*  ALBUMIN 1.4*   No results for input(s): LIPASE, AMYLASE in the last 168 hours. No  results for input(s): AMMONIA in the last 168 hours. Coagulation Profile: Recent Labs  Lab 05/26/17 0430 05/29/17 0213 05/30/17 0221 05/31/17 0806  INR 3.00 3.19 1.92 1.80   Cardiac Enzymes: No results for input(s): CKTOTAL, CKMB, CKMBINDEX, TROPONINI in the last 168 hours. BNP (last 3 results) No results for input(s): PROBNP in the last 8760 hours. HbA1C: No results for input(s): HGBA1C in the last 72 hours. CBG: Recent Labs  Lab 05/31/17 1121  GLUCAP 117*   Lipid Profile: No results for input(s): CHOL, HDL, LDLCALC, TRIG, CHOLHDL, LDLDIRECT in the last 72 hours. Thyroid Function Tests: Recent Labs    05/29/17 0234  TSH 6.571*   Anemia Panel: Recent Labs    05/29/17 0213  VITAMINB12 1,462*  FERRITIN 300  TIBC NOT CALCULATED  IRON 32*   Sepsis Labs: Recent Labs  Lab 05/26/17 0457 05/26/17 0752 05/29/17 1056 05/30/17 0221   PROCALCITON  --   --  0.51 0.36  LATICACIDVEN 1.97* 1.4  --   --     Recent Results (from the past 240 hour(s))  Blood Culture (routine x 2)     Status: None (Preliminary result)   Collection Time: 05/26/17  4:30 AM  Result Value Ref Range Status   Specimen Description BLOOD RIGHT WRIST  Final   Special Requests   Final    BOTTLES DRAWN AEROBIC AND ANAEROBIC Blood Culture adequate volume   Culture   Final    NO GROWTH 4 DAYS Performed at [facility], [address] [place], [place] [address]    Report Status PENDING  Incomplete  Blood Culture (routine x 2)     Status: None (Preliminary result)   Collection Time: 05/26/17  5:00 AM  Result Value Ref Range Status   Specimen Description BLOOD RIGHT ARM  Final   Special Requests   Final    AEROBIC BOTTLE ONLY Blood Culture results may not be optimal due to an excessive volume of blood received in culture bottles   Culture   Final    NO GROWTH 4 DAYS Performed at [facility], [address], [place], [place] [address]    Report Status PENDING  Incomplete  MRSA PCR Screening     Status: Abnormal   Collection Time: 05/27/17 10:45 AM  Result Value Ref Range Status   MRSA by PCR POSITIVE (A) NEGATIVE Final    Comment:        The GeneXpert MRSA Assay (FDA approved for NASAL specimens only), is one component of a comprehensive MRSA colonization surveillance program. It is not intended to diagnose MRSA infection nor to guide or monitor treatment for MRSA infections. RESULT CALLED TO, READ BACK BY AND VERIFIED WITH: Alinda Deem RN 13:20 05/27/17 ([email or web address]) Performed at [facility], [address] [place], [place] [address]          Radiology Studies: Dg Chest Port 1 View  Result Date: 05/30/2017 CLINICAL DATA:  Dyspnea,shob with chest pain,,used 2 pillows to get patient straight,could not follow directions EXAM: PORTABLE CHEST - 1 VIEW COMPARISON:  05/26/2017 FINDINGS: Progressive bibasilar airspace disease  with more dense consolidation at the left lung base. Worsening interstitial infiltrates or edema predominately perihilar. Probable pleural effusions left greater than right. Heart size upper limits normal for technique. Aortic Atherosclerosis (ICD10-170.0) No pneumothorax. Visualized bones unremarkable. IMPRESSION: 1. Worsening asymmetric infiltrates or edema with small effusions. Electronically Signed   By: Corlis Leak M.D.   On: 05/30/2017 09:38  Scheduled Meds: . acetylcysteine  3 mL Nebulization Once  . arformoterol  15 mcg Nebulization BID  . budesonide  0.5 mg Nebulization BID  . feeding supplement (ENSURE ENLIVE)  237 mL Oral BID BM  . ipratropium-albuterol  3 mL Nebulization TID  . scopolamine  1 patch Transdermal Q72H  . warfarin  1 mg Oral ONCE-1800  . Warfarin - Pharmacist Dosing Inpatient   Does not apply q1800   Continuous Infusions: . famotidine (PEPCID) IV 20 mg (05/30/17 2155)  . piperacillin-tazobactam (ZOSYN)  IV Stopped (05/31/17 0914)  . vancomycin Stopped (05/31/17 1047)     LOS: 5 days    Time spent: 30 mins    Alicia Ackert Joline Maxcy, MD [facility] Pager [phone]   If 7PM-7AM, please contact night-coverage [email or web address] Password [email or web address] 05/31/2017, 12:50 PM

## 2017-05-31 NOTE — Progress Notes (Signed)
  Speech Language Pathology Treatment: Dysphagia  Patient Details Name: [name] MRN: [id number] DOB: [birth year] Today's Date: 05/31/2017 Time: 2080-2233 SLP Time Calculation (min) (ACUTE ONLY): 15 min  Assessment / Plan / Recommendation Clinical Impression  F/u education with pt's son.  Pt remains lethargic, congested.  Family has fed him limited POs as he is able - continues to present with s/s of aspiration.  Dr. Nelson Chimes had conversation with family re: ongoing aspiration risk, our efforts to minimize but our inability to prevent aspiration.  I reiterated these points with pt's son today, who verbalizes understanding and takes responsibility for feeding his father, knowing that some of this material may enter lungs. There is little else our services can offer.  D/W son that we would be signing off.  He again verbalizes understanding.     HPI HPI: 84 year old with a history of diabetes mellitus type 2, essential hypertension, coronary artery disease, unspecified CHF, multiple CVAs was brought to the hospital with complains of chest pain. Pt presents with chest pain, hypotension, and Chest x-ray was suggestive of possible community-acquired pneumonia. Last seen by SLP in October 2018, with most recent objective study revealing that pt had a functional oropharyngeal swallow. Oral phase mildly delayed, suspect a combination of cognitive and respiratory deficits contributing, with swallow triggering intermittently at the pyriform sinuses with thin liquids but with full airway protection. [name] reports pt still consuming pureed/blended consisitencies PTA.       SLP Plan  Discharge SLP treatment due to (comment)(poor progress; comfort POs per MD)       Recommendations  Diet recommendations: Other(comment)(comfort POs per MD)                Oral Care Recommendations: Oral care BID Follow up Recommendations: None SLP Visit Diagnosis: Dysphagia, unspecified (R13.10) Plan:  Discharge SLP treatment due to (comment)(poor progress; comfort POs per MD)       GO                [name] 05/31/2017, 12:16 PM

## 2017-06-01 LAB — CBC
HEMATOCRIT: 28 % — AB (ref 39.0–52.0)
HEMOGLOBIN: 9.6 g/dL — AB (ref 13.0–17.0)
MCH: 31.1 pg (ref 26.0–34.0)
MCHC: 34.3 g/dL (ref 30.0–36.0)
MCV: 90.6 fL (ref 78.0–100.0)
Platelets: 159 10*3/uL (ref 150–400)
RBC: 3.09 MIL/uL — AB (ref 4.22–5.81)
RDW: 19.5 % — AB (ref 11.5–15.5)
WBC: 17.7 10*3/uL — AB (ref 4.0–10.5)

## 2017-06-01 LAB — BRAIN NATRIURETIC PEPTIDE: B NATRIURETIC PEPTIDE 5: 459.6 pg/mL — AB (ref 0.0–100.0)

## 2017-06-01 LAB — BASIC METABOLIC PANEL
ANION GAP: 8 (ref 5–15)
BUN: 27 mg/dL — AB (ref 6–20)
CO2: 20 mmol/L — ABNORMAL LOW (ref 22–32)
Calcium: 7.9 mg/dL — ABNORMAL LOW (ref 8.9–10.3)
Chloride: 113 mmol/L — ABNORMAL HIGH (ref 101–111)
Creatinine, Ser: 1.85 mg/dL — ABNORMAL HIGH (ref 0.61–1.24)
GFR calc Af Amer: 36 mL/min — ABNORMAL LOW (ref >60)
GFR, EST NON AFRICAN AMERICAN: 31 mL/min — AB (ref >60)
Glucose, Bld: 108 mg/dL — ABNORMAL HIGH (ref 65–99)
POTASSIUM: 3.4 mmol/L — AB (ref 3.5–5.1)
SODIUM: 141 mmol/L (ref 135–145)

## 2017-06-01 LAB — MAGNESIUM: Magnesium: 1.5 mg/dL — ABNORMAL LOW (ref 1.7–2.4)

## 2017-06-01 LAB — FOLATE RBC
FOLATE, HEMOLYSATE: 346.4 ng/mL
Folate, RBC: 1665 ng/mL (ref >498)
HEMATOCRIT: 20.8 % — AB (ref 37.5–51.0)

## 2017-06-01 LAB — PROTIME-INR
INR: 1.63
PROTHROMBIN TIME: 19.2 s — AB (ref 11.4–15.2)

## 2017-06-01 MED ORDER — MUPIROCIN 2 % EX OINT
TOPICAL_OINTMENT | Freq: Two times a day (BID) | CUTANEOUS | Status: DC
Start: 2017-06-01 — End: 2017-06-07
  Administered 2017-06-01 – 2017-06-05 (×10): via NASAL
  Administered 2017-06-06: 1 via NASAL
  Administered 2017-06-06 – 2017-06-07 (×2): via NASAL
  Filled 2017-06-01 (×4): qty 22

## 2017-06-01 MED ORDER — WARFARIN SODIUM 1 MG PO TABS
1.0000 mg | ORAL_TABLET | Freq: Once | ORAL | Status: DC
  Filled 2017-06-01: qty 1

## 2017-06-01 NOTE — Progress Notes (Signed)
ANTICOAGULATION CONSULT NOTE - Follow-Up Consult  Pharmacy Consult for warfarin Indication: atrial fibrillation  Allergies  Allergen Reactions  . Levofloxacin Anaphylaxis and Other (See Comments)    Patient told his daughter that it made him "feel worse than he already did" (overall) Headache also (has refused to take anymore)  . Eliquis [Apixaban] Itching and Swelling  . Phenazopyridine Hcl Other (See Comments)    Unknown reaction  . Septra [Sulfamethoxazole-Trimethoprim] Nausea And Vomiting and Other (See Comments)    GI Upset  . Latex Rash  . Tape Rash    Vital Signs: Temp: 97.8 F (36.6 C) (04/22 0100) Temp Source: Axillary (04/22 0100) BP: 89/52 (04/22 0100) Pulse Rate: 100 (04/21 2106)  Labs: Recent Labs    05/30/17 0221 05/31/17 0806 05/31/17 0812 06/01/17 0214  HGB 10.0*  --  10.1* 9.6*  HCT 30.5*  --  31.6* 28.0*  PLT 212  --  173 159  LABPROT 21.8* 20.7*  --  19.2*  INR 1.92 1.80  --  1.63  CREATININE 1.59* 1.73*  --  1.85*    Assessment: 84 yo M on Coumadin 1mg  on Sat and no other doses PTA for hx Afib/CVA. Admit INR 3 on 4/16. Outpt INR goal 2.5-3.5. INR has been trending down and now at 1.63. Also not really taking PO meds so may not be able to give. GOC also being discussed at this time. Hgb 9.6, plts wnl  Goal of Therapy:  INR 2.5-3.5 Monitor platelets by anticoagulation protocol: Yes   Plan:  Give Coumadin 1mg  PO x 1 tonight if able Monitor daily INR, CBC, s/s of bleed  Enzo Bi, PharmD, [facility] Clinical Pharmacist Pager [phone] 06/01/2017 8:45 AM

## 2017-06-01 NOTE — Progress Notes (Signed)
PROGRESS NOTE    [name]  LFY:[id number] DOB: 12-23-1926 DOA: 05/26/2017 PCP: [name], MD   Brief Narrative:   84 year old with a history of diabetes mellitus type 2, essential hypertension, coronary artery disease, unspecified CHF, multiple CVAs was brought to the hospital with complains of chest pain.  Patient was found to be hypotensive initially despite of getting IV fluids but family did not want any aggressive measures besides IV fluids and antibiotics.  Chest x-ray was suggestive of possible community-acquired pneumonia therefore started on IV antibiotics. During the admission I had an extensive discussion with the patient's family member regarding his chronic long-term poor prognosis given his comorbidities and advanced age.  Family insisted to feed him despite aspiration risk but also requested speech and swallow evaluation. Currently ongoing discussions with Palliative care.   Assessment & Plan:   Principal Problem:   Acute respiratory failure (HCC) Active Problems:   Acute renal failure superimposed on stage 3 chronic kidney disease (HCC)   AAA (abdominal aortic aneurysm) without rupture (HCC)   Coronary artery disease   Chest pain at rest   Hyponatremia   Carotid artery disease (HCC)   CAP (community acquired pneumonia)   Sepsis (HCC)   COPD, group B, by GOLD 2017 classification (HCC)   Aphasia   Benign essential HTN   Chronic atrial fibrillation (HCC)   Chronic diastolic congestive heart failure (HCC)   Cystitis   Hyperkalemia   Goals of care, counseling/discussion   Palliative care by specialist  Septic shock, improved Multifocal community-acquired pneumonia Acute hypoxic respiratory distress, 95% on 3L [place] - Continue nebulizer treatments every 6 hours, in between as needed if necessary -Patient is currently on vancomycin and Zosyn day 6 -At this time patient is getting intermittent IV fluid but due to pulmonary edema he is getting it with caution.   He is at high risk of respiratory failure but on the other hand he keeps getting hypotensive. I spoke with the family who are at bedside this morning including the wife and daughter who is aware of risk of fluid overload and hypotension with getting IV fluid and holding IV fluid respectively.  At this point would not like to proceed with any aggressive measures such as placing central line and starting patient on pressors.  Patient is also DNI.  Acute kidney injury on CKD stage III -I believe this has plateued around 1.5-1.7 -We will monitor creatinine daily and urine output  Anemia, of chronic disease and Dilutional  -Received 1 unit of PRBC on 4/19.  Hemoglobin stable at 9.6 -Iron studies showed ferritin 300, TIBC and saturation not calculated, B12 greater than 1000 -Folate pending  Hyponatremia, resolved  COPD -Patient is not acute flare therefore not getting any steroids but patient is getting nebulizer treatments as mentioned above  History of coronary artery disease Atrial fibrillation Abdominal aortic aneurysm without rupture -Home medications currently on hold due to low blood pressure. -Patient is on Coumadin at home, will have pharmacy manage.   Essential hypertension -Antihypertensives on hold  Severe protein calorie malnutrition -Due to his advanced age patient has very poor appetite. -We will add boost to his diet  Appreciate palliative care team input.  Again I had discussion with the family today that patient's overall prognosis is very poor given his advanced age and other comorbidities.  At this point options are very limited.  He is getting IV antibiotics but despite of that has intermittent episodes of hypotension but he is at a risk  of respiratory failure due to pulmonary edema if he gets extra fluids. I have also explored the option of home hospice with the family but states they are not ready for it yet.  DVT prophylaxis: Family insist patient gets Coumadin but  they are aware that he is at risk of aspiration.  Will give Coumadin if he is awake and Code Status: DO NOT RESUSCITATE Family Communication: Son at bedside.  Disposition Plan: Maintain inpatient stay, once his oxygen requirement is improved we will start transitioning home.  Consultants:   Palliative care  Procedures:   None  Antimicrobials:   Vancomycin 4/16  Zosyn 4/16   Subjective: Patient sitting up this morning following basic commands.  Still appears quite weak. Had episode of hypotension with systolic in the low 80s yesterday for which she received small IV bolus to which he responded a little bit.  Review of Systems Unable to obtain due to his mentation  Objective: Vitals:   05/31/17 2106 06/01/17 0100 06/01/17 0847 06/01/17 0905  BP: 99/66 (!) 89/52  109/79  Pulse: 100   (!) 106  Resp:  18  (!) 22  Temp: 97.9 F (36.6 C) 97.8 F (36.6 C)  (!) 97.3 F (36.3 C)  TempSrc: Oral Axillary  Oral  SpO2: 100% 100% 98% 100%  Weight:      Height:        Intake/Output Summary (Last 24 hours) at 06/01/2017 1208 Last data filed at 06/01/2017 0900 Gross per 24 hour  Intake 250 ml  Output 2700 ml  Net -2450 ml   Filed Weights   05/30/17 0324  Weight: 61.2 kg (134 lb 14.7 oz)    Examination:  General exam: Sitting up on the bed but appears quite weak and ill, temporal wasting Respiratory system: Diffuse very coarse breath sounds Cardiovascular system: S1 & S2 heard, RRR. No JVD, murmurs, rubs, gallops or clicks. No pedal edema. Gastrointestinal system: Abdomen is nondistended, softNo organomegaly or masses felt. Normal bowel sounds heard. Central nervous system: Unable to perform full neuro exam but he is able to grab my fingers and attempt to pull it towards him.  He is significantly weak. Extremities: Unable to fully assess Skin: No rashes, lesions or ulcers Psychiatry: Poor judgment    Data Reviewed:   CBC: Recent Labs  Lab 05/26/17 0430  05/28/17 0232 05/29/17 0213 05/30/17 0221 05/31/17 0812 06/01/17 0214  WBC 16.8* 18.4* 11.8* 12.3* 19.1* 17.7*  NEUTROABS 12.1*  --   --   --   --   --   HGB 8.3* 7.2* 6.7* 10.0* 10.1* 9.6*  HCT 26.0* 22.2* 21.8* 30.5* 31.6* 28.0*  MCV 93.2 92.5 95.2 90.2 93.2 90.6  PLT 262 235 218 212 173 159   Basic Metabolic Panel: Recent Labs  Lab 05/28/17 0232 05/29/17 0213 05/30/17 0221 05/31/17 0806 06/01/17 0214  NA 131* 135 138 139 141  K 4.3 4.1 4.2 4.3 3.4*  CL 110 114* 114* 115* 113*  CO2 15* 16* 17* 15* 20*  GLUCOSE 114* 77 109* 130* 108*  BUN 30* 25* 23* 26* 27*  CREATININE 1.90* 1.69* 1.59* 1.73* 1.85*  CALCIUM 7.2* 7.4* 7.8* 7.8* 7.9*  MG 1.5* 1.6* 1.4* 1.8 1.5*   GFR: Estimated Creatinine Clearance: 22.2 mL/min (A) (by C-G formula based on SCr of 1.85 mg/dL (H)). Liver Function Tests: Recent Labs  Lab 05/26/17 0430  AST 29  ALT 12*  ALKPHOS 135*  BILITOT 1.0  PROT 6.4*  ALBUMIN 1.4*   No results  for input(s): LIPASE, AMYLASE in the last 168 hours. No results for input(s): AMMONIA in the last 168 hours. Coagulation Profile: Recent Labs  Lab 05/26/17 0430 05/29/17 0213 05/30/17 0221 05/31/17 0806 06/01/17 0214  INR 3.00 3.19 1.92 1.80 1.63   Cardiac Enzymes: No results for input(s): CKTOTAL, CKMB, CKMBINDEX, TROPONINI in the last 168 hours. BNP (last 3 results) No results for input(s): PROBNP in the last 8760 hours. HbA1C: No results for input(s): HGBA1C in the last 72 hours. CBG: Recent Labs  Lab 05/31/17 1121  GLUCAP 117*   Lipid Profile: No results for input(s): CHOL, HDL, LDLCALC, TRIG, CHOLHDL, LDLDIRECT in the last 72 hours. Thyroid Function Tests: No results for input(s): TSH, T4TOTAL, FREET4, T3FREE, THYROIDAB in the last 72 hours. Anemia Panel: No results for input(s): VITAMINB12, FOLATE, FERRITIN, TIBC, IRON, RETICCTPCT in the last 72 hours. Sepsis Labs: Recent Labs  Lab 05/26/17 0457 05/26/17 0752 05/29/17 1056 05/30/17 0221  05/31/17 0806  PROCALCITON  --   --  0.51 0.36 0.52  LATICACIDVEN 1.97* 1.4  --   --   --     Recent Results (from the past 240 hour(s))  Blood Culture (routine x 2)     Status: None   Collection Time: 05/26/17  4:30 AM  Result Value Ref Range Status   Specimen Description BLOOD RIGHT WRIST  Final   Special Requests   Final    BOTTLES DRAWN AEROBIC AND ANAEROBIC Blood Culture adequate volume   Culture   Final    NO GROWTH 5 DAYS Performed at [facility], [address] [place], [place] [address]    Report Status 05/31/2017 FINAL  Final  Blood Culture (routine x 2)     Status: None   Collection Time: 05/26/17  5:00 AM  Result Value Ref Range Status   Specimen Description BLOOD RIGHT ARM  Final   Special Requests   Final    AEROBIC BOTTLE ONLY Blood Culture results may not be optimal due to an excessive volume of blood received in culture bottles   Culture   Final    NO GROWTH 5 DAYS Performed at [facility], [address] [place], [place] [address]    Report Status 05/31/2017 FINAL  Final  MRSA PCR Screening     Status: Abnormal   Collection Time: 05/27/17 10:45 AM  Result Value Ref Range Status   MRSA by PCR POSITIVE (A) NEGATIVE Final    Comment:        The GeneXpert MRSA Assay (FDA approved for NASAL specimens only), is one component of a comprehensive MRSA colonization surveillance program. It is not intended to diagnose MRSA infection nor to guide or monitor treatment for MRSA infections. RESULT CALLED TO, READ BACK BY AND VERIFIED WITH: Alinda Deem RN 13:20 05/27/17 ([email or web address]) Performed at [facility], [address], [place], [place] [address]          Radiology Studies: No results found.      Scheduled Meds: . acetylcysteine  3 mL Nebulization Once  . arformoterol  15 mcg Nebulization BID  . budesonide  0.5 mg Nebulization BID  . feeding supplement (ENSURE ENLIVE)  237 mL Oral BID BM  . ipratropium-albuterol  3 mL Nebulization  TID  . scopolamine  1 patch Transdermal Q72H  . warfarin  1 mg Oral ONCE-1800  . Warfarin - Pharmacist Dosing Inpatient   Does not apply q1800   Continuous Infusions: . famotidine (PEPCID) IV Stopped (05/31/17  2130)  . piperacillin-tazobactam (ZOSYN)  IV Stopped (06/01/17 1013)  . vancomycin Stopped (06/01/17 1140)     LOS: 6 days    Time spent: 25 mins    Ankit Joline Maxcy, MD [facility] Pager [phone]   If 7PM-7AM, please contact night-coverage [email or web address] Password [facility] 06/01/2017, 12:08 PM

## 2017-06-01 NOTE — Care Management Important Message (Signed)
Important Message  Patient Details  Name: [name] MRN: [id number] Date of Birth: 1926-07-04   Medicare Important Message Given:  Yes    Leone Haven, RN 06/01/2017, 4:24 PM

## 2017-06-02 DIAGNOSIS — J9602 Acute respiratory failure with hypercapnia: Secondary | ICD-10-CM

## 2017-06-02 LAB — BASIC METABOLIC PANEL
Anion gap: 6 (ref 5–15)
BUN: 28 mg/dL — AB (ref 6–20)
CHLORIDE: 115 mmol/L — AB (ref 101–111)
CO2: 21 mmol/L — AB (ref 22–32)
CREATININE: 1.79 mg/dL — AB (ref 0.61–1.24)
Calcium: 7.8 mg/dL — ABNORMAL LOW (ref 8.9–10.3)
GFR calc Af Amer: 37 mL/min — ABNORMAL LOW (ref >60)
GFR calc non Af Amer: 32 mL/min — ABNORMAL LOW (ref >60)
GLUCOSE: 98 mg/dL (ref 65–99)
Potassium: 3.2 mmol/L — ABNORMAL LOW (ref 3.5–5.1)
Sodium: 142 mmol/L (ref 135–145)

## 2017-06-02 LAB — CBC
HCT: 29.3 % — ABNORMAL LOW (ref 39.0–52.0)
Hemoglobin: 9.4 g/dL — ABNORMAL LOW (ref 13.0–17.0)
MCH: 29.5 pg (ref 26.0–34.0)
MCHC: 32.1 g/dL (ref 30.0–36.0)
MCV: 91.8 fL (ref 78.0–100.0)
Platelets: 151 10*3/uL (ref 150–400)
RBC: 3.19 MIL/uL — ABNORMAL LOW (ref 4.22–5.81)
RDW: 19.3 % — AB (ref 11.5–15.5)
WBC: 14.3 10*3/uL — ABNORMAL HIGH (ref 4.0–10.5)

## 2017-06-02 LAB — MAGNESIUM: Magnesium: 1.3 mg/dL — ABNORMAL LOW (ref 1.7–2.4)

## 2017-06-02 LAB — PROCALCITONIN: Procalcitonin: 0.34 ng/mL

## 2017-06-02 MED ORDER — MAGNESIUM OXIDE 400 (241.3 MG) MG PO TABS
800.0000 mg | ORAL_TABLET | Freq: Two times a day (BID) | ORAL | Status: AC
  Administered 2017-06-02 (×2): 800 mg via ORAL
  Filled 2017-06-02 (×2): qty 2

## 2017-06-02 MED ORDER — IPRATROPIUM-ALBUTEROL 0.5-2.5 (3) MG/3ML IN SOLN
3.0000 mL | Freq: Two times a day (BID) | RESPIRATORY_TRACT | Status: DC
  Administered 2017-06-02 – 2017-06-06 (×7): 3 mL via RESPIRATORY_TRACT
  Filled 2017-06-02 (×8): qty 3

## 2017-06-02 MED ORDER — WARFARIN SODIUM 1 MG PO TABS
1.0000 mg | ORAL_TABLET | Freq: Once | ORAL | Status: DC
  Filled 2017-06-02: qty 1

## 2017-06-02 MED ORDER — POTASSIUM CHLORIDE 20 MEQ/15ML (10%) PO SOLN
40.0000 meq | Freq: Two times a day (BID) | ORAL | Status: AC
  Administered 2017-06-02 (×2): 40 meq via ORAL
  Filled 2017-06-02 (×2): qty 30

## 2017-06-02 NOTE — Progress Notes (Addendum)
Daily Progress Note   Patient Name: [name]       Date: 06/02/2017 DOB: 08-19-1926  Age: 84 y.o. MRN#: [id number] Attending Physician: Dimple Nanas, MD Primary Care Physician: [name], MD Admit Date: 05/26/2017  Reason for Consultation/Follow-up: Establishing goals of care and Hospice Evaluation  Subjective: Patient sleeping, not interactive. Daughter and wife at bedside.  Length of Stay: 7  Current Medications: Scheduled Meds:  . acetylcysteine  3 mL Nebulization Once  . arformoterol  15 mcg Nebulization BID  . budesonide  0.5 mg Nebulization BID  . feeding supplement (ENSURE ENLIVE)  237 mL Oral BID BM  . ipratropium-albuterol  3 mL Nebulization BID  . magnesium oxide  800 mg Oral BID  . mupirocin ointment   Nasal BID  . potassium chloride  40 mEq Oral BID  . scopolamine  1 patch Transdermal Q72H  . warfarin  1 mg Oral ONCE-1800  . Warfarin - Pharmacist Dosing Inpatient   Does not apply q1800    Continuous Infusions: . famotidine (PEPCID) IV Stopped (06/01/17 2219)  . piperacillin-tazobactam (ZOSYN)  IV 3.375 g (06/02/17 0545)  . vancomycin Stopped (06/01/17 1140)    PRN Meds: acetaminophen **OR** acetaminophen, antiseptic oral rinse, bisacodyl, diphenhydrAMINE, glycopyrrolate **OR** glycopyrrolate **OR** glycopyrrolate, haloperidol **OR** haloperidol **OR** haloperidol lactate, LORazepam **OR** [DISCONTINUED] LORazepam **OR** LORazepam, ondansetron **OR** ondansetron (ZOFRAN) IV, ondansetron **OR** ondansetron (ZOFRAN) IV, polyvinyl alcohol, RESOURCE THICKENUP CLEAR  Physical Exam  Constitutional: He appears lethargic. He has a sickly appearance.  HENT:  Head: Normocephalic and atraumatic.  Mouth/Throat: Oropharynx is clear and moist.  Cardiovascular: Normal  rate and regular rhythm.  Pulmonary/Chest: Breath sounds normal. No accessory muscle usage. No tachypnea. No respiratory distress.  2L [place]  Abdominal: Soft. Bowel sounds are normal.  Musculoskeletal:       Right lower leg: He exhibits edema.       Left lower leg: He exhibits edema.  Neurological: He appears lethargic. He is disoriented.  Skin: Skin is warm and dry. He is not diaphoretic.            Vital Signs: BP 108/73 (BP Location: Left Arm)   Pulse 100   Temp 97.8 F (36.6 C) (Oral)   Resp 19   Ht 5\' 3"  (1.6 m) Comment: estimated by family  Wt 61.2 kg (134 lb 14.7 oz)   SpO2 98%   BMI 23.90 kg/m  SpO2: SpO2: 98 % O2 Device: O2 Device: Nasal Cannula O2 Flow Rate: O2 Flow Rate (L/min): 2 L/min  Intake/output summary:   Intake/Output Summary (Last 24 hours) at 06/02/2017 1045 Last data filed at 06/02/2017 0545 Gross per 24 hour  Intake 300 ml  Output 700 ml  Net -400 ml   LBM: Last BM Date: 06/02/17 Baseline Weight: Weight: 61.2 kg (134 lb 14.7 oz) Most recent weight: Weight: 61.2 kg (134 lb 14.7 oz)       Palliative Assessment/Data: PPS 30%    Flowsheet Rows     Most Recent Value  Intake Tab  Referral Department  Hospitalist  Unit at Time of Referral  Med/Surg Unit  Palliative Care Primary Diagnosis  Sepsis/Infectious Disease  Date Notified  05/27/17  Palliative Care Type  New Palliative care  Reason for referral  Clarify Goals of Care  Date of Admission  05/26/17  Date first seen by Palliative Care  05/28/17  # of days Palliative referral response time  1 Day(s)  # of days IP prior to Palliative referral  1  Clinical Assessment  Palliative Performance Scale Score  30%  Psychosocial & Spiritual Assessment  Palliative Care Outcomes  Patient/Family meeting held?  Yes  Who was at the meeting?  daughter  Palliative Care Outcomes  Clarified goals of care, Counseled regarding hospice, Provided psychosocial or spiritual support      Patient Active Problem  List   Diagnosis Date Noted  . Goals of care, counseling/discussion   . Palliative care by specialist   . Cystitis 05/26/2017  . Hyperkalemia 05/26/2017  . 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 Cerebral infarction due to embolism of cerebral artery (HCC)   . Chronic atrial fibrillation (HCC)   . History of CVA (cerebrovascular accident)   . Dysphagia, post-stroke   . Chronic diastolic congestive heart failure (HCC)   . Uncomplicated asthma   . History of gout   . Persistent atrial fibrillation (HCC)   . Aphasia   . HCAP (healthcare-associated pneumonia)   . SOB (shortness of breath)   . PAF (paroxysmal atrial fibrillation) (HCC)   . Diabetes mellitus type 2 in nonobese (HCC)   . Stage 3 chronic kidney disease (HCC)   . Tachypnea   . Benign essential HTN   . Hypokalemia   . Acute blood loss anemia   . Cerebral thrombosis with cerebral infarction 11/27/2016  . Stroke (cerebrum) (HCC) 11/27/2016  . Acute respiratory failure (HCC)   . Stenosis of left carotid artery   . Acute ischemic stroke (HCC) 10/18/2016  . Cerebral infarction due to stenosis of right middle cerebral artery (HCC) 10/10/2016  . CVA (cerebral vascular accident) (HCC) 07/18/2016  . Stroke-like symptoms 07/18/2016  . Stroke (HCC) 07/18/2016  . Anemia 07/18/2016  . Non-English speaking patient 07/18/2016  . COPD, group B, by GOLD 2017 classification (HCC)   . Sepsis (HCC) 10/03/2015  . Nausea, vomiting and diarrhea 10/03/2015  . CKD (chronic kidney disease), stage III (HCC) 07/27/2015  . Carotid artery disease (HCC) 07/27/2015  . CAP (community acquired pneumonia) 07/27/2015  . Hyponatremia 06/18/2014  . Paroxysmal atrial fibrillation (HCC) 11/16/2013  . Ischemic cardiomyopathy 11/16/2013  . Headache 11/16/2013  . Chronic anticoagulation 11/16/2013  . Neck pain on left side 11/16/2013  . Chest pain at rest 11/16/2013  . Coronary artery disease 10/14/2013  . Chronic systolic heart failure  (HCC) 16/11/9602  . AAA (abdominal aortic aneurysm) without rupture (HCC) 09/08/2013  . Hyperlipidemia 06/08/2013  . History of stroke 05/02/2013  . Acute renal failure superimposed on stage 3 chronic kidney disease (HCC) 04/29/2013  . PVD (peripheral vascular disease) 3.5cm AAA Aug 2014 04/29/2013  . NSTEMI - ? type 2 - Troponin 0.63 04/28/2013  . Chronic bilateral lower abdominal pain 02/17/2013  . Hypertension     Palliative Care Assessment & Plan   HPI: 84 y.o.malewith past medical history of T2DM, HTN, CAD, CHF, CVA, CKD3, AAA, COPD, and a fib on coumadinadmitted on 4/16/2019with chest pain.He was found to be hypotensive. Chest x-ray was suggestive of pneumonia. Patient has dysphagia and family insistent upon feeding him.   PMT consulted for goals of care and poor prognosis.  Assessment: Follow-up meeting with daughter. She tells me she feels that the patient is at his baseline.  We discussed that patient is likely near the end-of-life. We discussed that patient has completed antibiotics, yet is still lethargic and not eating/drinking much.  She tells me she understands this. We talked about where she wants him to be for the time he has left. She tells me he cannot return home because there are 10 people living in a small home. We discussed residential hospice and they type of care that is provided there - she is interested in residential hospice but then tells me she is comfortable at the hospital and wants to stay here. We discussed how hospice specializes in comfort and end of life care. She tells me she wants time to think about things and discuss with other family members.   Discussed life review of her father. He has been married to her mother over 16 years and lived in [place] for 30 years. He came to [place] after she (the daughter) moved here to be near her and her children.   Recommendations/Plan:  Discussed residential hospice - daughter is interested but wants time to think  about it, tells me she is comfortable in the hospital  PMT will continue meeting w/family  Goals of Care and Additional Recommendations:  Limitations on Scope of Treatment: Initiate Comfort Feeding and No Artificial Feeding  Code Status:  DNR  Prognosis:   < 2 weeks, pneumonia, minimally responsive, poor po intake  Discharge Planning:  To Be Determined  Care plan was discussed with daughter, Dr. Nelson Chimes  Thank you for allowing the Palliative Medicine Team to assist in the care of this patient.   Time In: 1345 Time Out: 1430 Total Time 45 minutes Prolonged Time Billed  no       Greater than 50%  of this time was spent counseling and coordinating care related to the above assessment and plan.  Gerlean Ren, DNP, AGNP-C Palliative Medicine Team Team Phone # [phone]

## 2017-06-02 NOTE — Progress Notes (Signed)
resp failure, asp pna, hypotensive, at risk for pulmonary edema if gets extra fluids.  Palliative following, family still wants to treat.  conts  On iv abx, plan is home when stable, family not ready for hospice yet.

## 2017-06-02 NOTE — Progress Notes (Signed)
PROGRESS NOTE    [name]  YQM:[id number] DOB: 03-20-1926 DOA: 05/26/2017 PCP: [name], MD   Brief Narrative:   84 year old with a history of diabetes mellitus type 2, essential hypertension, coronary artery disease, unspecified CHF, multiple CVAs was brought to the hospital with complains of chest pain.  Patient was found to be hypotensive initially despite of getting IV fluids but family did not want any aggressive measures besides IV fluids and antibiotics.  Chest x-ray was suggestive of possible community-acquired pneumonia therefore started on IV antibiotics. During the admission I had an extensive discussion with the patient's family member regarding his chronic long-term poor prognosis given his comorbidities and advanced age.  Family insisted to feed him despite aspiration risk but also requested speech and swallow evaluation. Currently ongoing discussions with Palliative care.   Assessment & Plan:   Principal Problem:   Acute respiratory failure (HCC) Active Problems:   Acute renal failure superimposed on stage 3 chronic kidney disease (HCC)   AAA (abdominal aortic aneurysm) without rupture (HCC)   Coronary artery disease   Chest pain at rest   Hyponatremia   Carotid artery disease (HCC)   CAP (community acquired pneumonia)   Sepsis (HCC)   COPD, group B, by GOLD 2017 classification (HCC)   Aphasia   Benign essential HTN   Chronic atrial fibrillation (HCC)   Chronic diastolic congestive heart failure (HCC)   Cystitis   Hyperkalemia   Goals of care, counseling/discussion   Palliative care by specialist  Septic shock, improved Multifocal community-acquired pneumonia Acute hypoxic respiratory distress, 95% on 3L  - Continue nebulizer treatments every 6 hours, in between as needed if necessary -Patient is currently on vancomycin and Zosyn day 7. Will plan on discontinuing the Abx and watching him off it -Spoke with daughter there is a very fine balance  between me giving him fluids for intermittent hypotension and lasix. Daughter is aware that he has very poor pulm reserve. I have recommended they should speak with hospice team since they wish to make the patient more comfortable. In the mean time maintain aspiration precaution. I have advised the family to feed him whatever they wish and would recommend comfort care despite of pureed diet. At this time, nursing staff advised not to give him anything PO if he is not awake since its not our standard of care. But if family wishes to feed him and give him meds at the risk of worsening of breathing and aspiration, they may. Family understands.   Acute kidney injury on CKD stage III;stable -I believe this has plateued around 1.5-1.7 -We will monitor creatinine daily and urine output  Anemia, of chronic disease and Dilutional  -Received 1 unit of PRBC on 4/19.  Hemoglobin stable at 9.6 -Iron studies showed ferritin 300, TIBC and saturation not calculated, B12 greater than 1000  Hyponatremia, resolved  COPD -Patient is not acute flare therefore not getting any steroids but patient is getting nebulizer treatments as mentioned above  History of coronary artery disease Atrial fibrillation Abdominal aortic aneurysm without rupture -Home medications currently on hold due to low blood pressure. -Patient is on Coumadin at home, will have pharmacy manage.   Essential hypertension -Antihypertensives on hold  Severe protein calorie malnutrition -Due to his advanced age patient has very poor appetite. -We will add boost to his diet  Will reconsult palliative care team as they would like to speak about possible hospice.   DVT prophylaxis: Family insist patient gets Coumadin but they are  aware that he is at risk of aspiration.  Will give Coumadin if he is awake and Code Status: DO NOT RESUSCITATE Family Communication: Son at bedside.  Disposition Plan: Maintain inpatient stay  Consultants:    Palliative care  Procedures:   None  Antimicrobials:   Vancomycin 4/16  Zosyn 4/16   Subjective: Spoke with the family about his chronic condition and how its remains critical and guarded.  They are unable to take the patient home as they have >10 people living in a small one bedroom apt. I have given them the option of hospice facility, which they will explore with the palliative team.   Review of Systems Unable to obtain to to his mentation.   Objective: Vitals:   06/01/17 2100 06/02/17 0000 06/02/17 0700 06/02/17 0945  BP:  (!) 83/65 108/73   Pulse: (!) 101 (!) 102 100   Resp: 20  19   Temp:  97.8 F (36.6 C) 97.8 F (36.6 C)   TempSrc:  Oral Oral   SpO2: 98% 100% 92% 98%  Weight:      Height:        Intake/Output Summary (Last 24 hours) at 06/02/2017 1143 Last data filed at 06/02/2017 0545 Gross per 24 hour  Intake 300 ml  Output 700 ml  Net -400 ml   Filed Weights   05/30/17 0324  Weight: 61.2 kg (134 lb 14.7 oz)    Examination: Constitutional: actively coughing, elderly frail. Responds to his name and tracks but doesn't carry on a meaning ful conversation. Temporal wasting.  Eyes: PERRL, lids and conjunctivae normal ENMT: Mucous membranes are moist. Posterior pharynx clear of any exudate or lesions.Normal dentition.  Neck: normal, supple, no masses, no thyromegaly Respiratory: b/l crackles midway up his lungs.  Cardiovascular: Regular rate and rhythm, no murmurs / rubs / gallops. No extremity edema. 2+ pedal pulses. No carotid bruits.  Abdomen: no tenderness, no masses palpated. No hepatosplenomegaly. Bowel sounds positive.  Musculoskeletal: no clubbing / cyanosis. No joint deformity upper and lower extremities. Good ROM, no contractures. Normal muscle tone.  Skin: no rashes, lesions, ulcers. No induration Neurologic: unable to fully asses. Has 3/5 strenght in b/l UE Psychiatric: poor insight.   Data Reviewed:   CBC: Recent Labs  Lab  05/29/17 0213 05/30/17 0221 05/31/17 0812 06/01/17 0214 06/02/17 0233  WBC 11.8* 12.3* 19.1* 17.7* 14.3*  HGB 6.7* 10.0* 10.1* 9.6* 9.4*  HCT 21.8*  20.8* 30.5* 31.6* 28.0* 29.3*  MCV 95.2 90.2 93.2 90.6 91.8  PLT 218 212 173 159 151   Basic Metabolic Panel: Recent Labs  Lab 05/29/17 0213 05/30/17 0221 05/31/17 0806 06/01/17 0214 06/02/17 0233  NA 135 138 139 141 142  K 4.1 4.2 4.3 3.4* 3.2*  CL 114* 114* 115* 113* 115*  CO2 16* 17* 15* 20* 21*  GLUCOSE 77 109* 130* 108* 98  BUN 25* 23* 26* 27* 28*  CREATININE 1.69* 1.59* 1.73* 1.85* 1.79*  CALCIUM 7.4* 7.8* 7.8* 7.9* 7.8*  MG 1.6* 1.4* 1.8 1.5* 1.3*   GFR: Estimated Creatinine Clearance: 23 mL/min (A) (by C-G formula based on SCr of 1.79 mg/dL (H)). Liver Function Tests: No results for input(s): AST, ALT, ALKPHOS, BILITOT, PROT, ALBUMIN in the last 168 hours. No results for input(s): LIPASE, AMYLASE in the last 168 hours. No results for input(s): AMMONIA in the last 168 hours. Coagulation Profile: Recent Labs  Lab 05/29/17 0213 05/30/17 0221 05/31/17 0806 06/01/17 0214  INR 3.19 1.92 1.80 1.63   Cardiac  Enzymes: No results for input(s): CKTOTAL, CKMB, CKMBINDEX, TROPONINI in the last 168 hours. BNP (last 3 results) No results for input(s): PROBNP in the last 8760 hours. HbA1C: No results for input(s): HGBA1C in the last 72 hours. CBG: Recent Labs  Lab 05/31/17 1121  GLUCAP 117*   Lipid Profile: No results for input(s): CHOL, HDL, LDLCALC, TRIG, CHOLHDL, LDLDIRECT in the last 72 hours. Thyroid Function Tests: No results for input(s): TSH, T4TOTAL, FREET4, T3FREE, THYROIDAB in the last 72 hours. Anemia Panel: No results for input(s): VITAMINB12, FOLATE, FERRITIN, TIBC, IRON, RETICCTPCT in the last 72 hours. Sepsis Labs: Recent Labs  Lab 05/29/17 1056 05/30/17 0221 05/31/17 0806 06/02/17 0233  PROCALCITON 0.51 0.36 0.52 0.34    Recent Results (from the past 240 hour(s))  Blood Culture (routine  x 2)     Status: None   Collection Time: 05/26/17  4:30 AM  Result Value Ref Range Status   Specimen Description BLOOD RIGHT WRIST  Final   Special Requests   Final    BOTTLES DRAWN AEROBIC AND ANAEROBIC Blood Culture adequate volume   Culture   Final    NO GROWTH 5 DAYS Performed at [facility], [address] [place], [place] [address]    Report Status 05/31/2017 FINAL  Final  Blood Culture (routine x 2)     Status: None   Collection Time: 05/26/17  5:00 AM  Result Value Ref Range Status   Specimen Description BLOOD RIGHT ARM  Final   Special Requests   Final    AEROBIC BOTTLE ONLY Blood Culture results may not be optimal due to an excessive volume of blood received in culture bottles   Culture   Final    NO GROWTH 5 DAYS Performed at [facility], [address] [place], [place] [address]    Report Status 05/31/2017 FINAL  Final  MRSA PCR Screening     Status: Abnormal   Collection Time: 05/27/17 10:45 AM  Result Value Ref Range Status   MRSA by PCR POSITIVE (A) NEGATIVE Final    Comment:        The GeneXpert MRSA Assay (FDA approved for NASAL specimens only), is one component of a comprehensive MRSA colonization surveillance program. It is not intended to diagnose MRSA infection nor to guide or monitor treatment for MRSA infections. RESULT CALLED TO, READ BACK BY AND VERIFIED WITH: Alinda Deem RN 13:20 05/27/17 ([email or web address]) Performed at [facility], [address], [place], [place] [address]          Radiology Studies: No results found.      Scheduled Meds: . acetylcysteine  3 mL Nebulization Once  . arformoterol  15 mcg Nebulization BID  . budesonide  0.5 mg Nebulization BID  . feeding supplement (ENSURE ENLIVE)  237 mL Oral BID BM  . ipratropium-albuterol  3 mL Nebulization BID  . magnesium oxide  800 mg Oral BID  . mupirocin ointment   Nasal BID  . potassium chloride  40 mEq Oral BID  . scopolamine  1 patch Transdermal Q72H  .  warfarin  1 mg Oral ONCE-1800  . Warfarin - Pharmacist Dosing Inpatient   Does not apply q1800   Continuous Infusions: . famotidine (PEPCID) IV Stopped (06/01/17 2219)  . piperacillin-tazobactam (ZOSYN)  IV 3.375 g (06/02/17 0545)  . vancomycin Stopped (06/01/17 1140)     LOS: 7 days    Time spent: 30 mins    Nikki Rusnak Joline Maxcy, MD [facility] Pager [phone]   If 7PM-7AM, please contact night-coverage [email or web address] Password [facility] 06/02/2017, 11:43 AM

## 2017-06-02 NOTE — Progress Notes (Signed)
ANTICOAGULATION CONSULT NOTE - Follow-Up Consult  Pharmacy Consult for warfarin Indication: atrial fibrillation  Allergies  Allergen Reactions  . Levofloxacin Anaphylaxis and Other (See Comments)    Patient told his daughter that it made him "feel worse than he already did" (overall) Headache also (has refused to take anymore)  . Eliquis [Apixaban] Itching and Swelling  . Phenazopyridine Hcl Other (See Comments)    Unknown reaction  . Septra [Sulfamethoxazole-Trimethoprim] Nausea And Vomiting and Other (See Comments)    GI Upset  . Latex Rash  . Tape Rash    Vital Signs: Temp: 97.8 F (36.6 C) (04/23 0000) Temp Source: Oral (04/23 0000) BP: 83/65 (04/23 0000) Pulse Rate: 102 (04/23 0000)  Labs: Recent Labs    05/31/17 0806  05/31/17 0812 06/01/17 0214 06/02/17 0233  HGB  --    < > 10.1* 9.6* 9.4*  HCT  --   --  31.6* 28.0* 29.3*  PLT  --   --  173 159 151  LABPROT 20.7*  --   --  19.2*  --   INR 1.80  --   --  1.63  --   CREATININE 1.73*  --   --  1.85* 1.79*   < > = values in this interval not displayed.    Assessment: 84 yo M on Coumadin 1mg  on Sat and no other doses PTA for hx Afib/CVA. Admit INR 3 on 4/16. Outpt INR goal 2.5-3.5. INR has been trending down and now at 1.63. Also not really taking PO meds so may not be able to give. GOC also being discussed at this time. Patient refused dose last night. Hgb 9.4, plts wnl  Goal of Therapy:  INR 2.5-3.5 Monitor platelets by anticoagulation protocol: Yes   Plan:  Give Coumadin 1mg  PO x 1 tonight if able Monitor daily INR, CBC, s/s of bleed  Enzo Bi, PharmD, [facility] Clinical Pharmacist Pager [phone] 06/02/2017 7:45 AM

## 2017-06-03 LAB — CBC
HEMATOCRIT: 28.9 % — AB (ref 39.0–52.0)
HEMOGLOBIN: 9.2 g/dL — AB (ref 13.0–17.0)
MCH: 29.4 pg (ref 26.0–34.0)
MCHC: 31.8 g/dL (ref 30.0–36.0)
MCV: 92.3 fL (ref 78.0–100.0)
Platelets: 139 10*3/uL — ABNORMAL LOW (ref 150–400)
RBC: 3.13 MIL/uL — ABNORMAL LOW (ref 4.22–5.81)
RDW: 19.6 % — AB (ref 11.5–15.5)
WBC: 11.2 10*3/uL — AB (ref 4.0–10.5)

## 2017-06-03 LAB — PROTIME-INR
INR: 1.75
PROTHROMBIN TIME: 20.3 s — AB (ref 11.4–15.2)

## 2017-06-03 LAB — BASIC METABOLIC PANEL
ANION GAP: 8 (ref 5–15)
BUN: 26 mg/dL — AB (ref 6–20)
CHLORIDE: 118 mmol/L — AB (ref 101–111)
CO2: 21 mmol/L — ABNORMAL LOW (ref 22–32)
Calcium: 7.8 mg/dL — ABNORMAL LOW (ref 8.9–10.3)
Creatinine, Ser: 1.68 mg/dL — ABNORMAL HIGH (ref 0.61–1.24)
GFR calc Af Amer: 40 mL/min — ABNORMAL LOW (ref >60)
GFR calc non Af Amer: 35 mL/min — ABNORMAL LOW (ref >60)
GLUCOSE: 103 mg/dL — AB (ref 65–99)
POTASSIUM: 3.9 mmol/L (ref 3.5–5.1)
Sodium: 147 mmol/L — ABNORMAL HIGH (ref 135–145)

## 2017-06-03 LAB — MAGNESIUM: Magnesium: 1.4 mg/dL — ABNORMAL LOW (ref 1.7–2.4)

## 2017-06-03 MED ORDER — WARFARIN SODIUM 1 MG PO TABS
1.0000 mg | ORAL_TABLET | Freq: Once | ORAL | Status: AC
  Administered 2017-06-03: 1 mg via ORAL
  Filled 2017-06-03: qty 1

## 2017-06-03 NOTE — Progress Notes (Signed)
PROGRESS NOTE    [name]  ZOX:[id number] DOB: 05-Jan-1927 DOA: 05/26/2017 PCP: [name], MD   Brief Narrative:   84 year old with a history of diabetes mellitus type 2, essential hypertension, coronary artery disease, unspecified CHF, multiple CVAs was brought to the hospital with complains of chest pain.  Patient was found to be hypotensive initially despite of getting IV fluids but family did not want any aggressive measures besides IV fluids and antibiotics.  Chest x-ray was suggestive of possible community-acquired pneumonia therefore started on IV antibiotics. During the admission I had an extensive discussion with the patient's family member regarding his chronic long-term poor prognosis given his comorbidities and advanced age.  Family insisted to feed him despite aspiration risk but also requested speech and swallow evaluation. Currently ongoing discussions with Palliative care.   Assessment & Plan:   Principal Problem:   Acute respiratory failure (HCC) Active Problems:   Acute renal failure superimposed on stage 3 chronic kidney disease (HCC)   AAA (abdominal aortic aneurysm) without rupture (HCC)   Coronary artery disease   Chest pain at rest   Hyponatremia   Carotid artery disease (HCC)   CAP (community acquired pneumonia)   Sepsis (HCC)   COPD, group B, by GOLD 2017 classification (HCC)   Aphasia   Benign essential HTN   Chronic atrial fibrillation (HCC)   Chronic diastolic congestive heart failure (HCC)   Cystitis   Hyperkalemia   Goals of care, counseling/discussion   Palliative care by specialist  Septic shock, improved Multifocal community-acquired pneumonia Acute hypoxic respiratory distress, 95% on 3L [place] - Continue nebulizer treatments every 6 hours, in between as needed if necessary -Patient is currently on vancomycin and Zosyn day 7. Will plan on discontinuing the Abx and watching him off it -Daughter insist patient stay in the hospital as  long as possible despite of me explaining here that patient is appropriate hospice candidate, family has been educated on it. Palliative care team is following. At time patients family insist he gets his medications despite of his mentation and I have informed them its against standard of medical care for [place] to administer it. Family would like to do it at their risk.   Chronic Kidney Disease Stage III -I believe this has plateued around 1.5-1.7 -We will monitor creatinine daily and urine output  Anemia, of chronic disease and Dilutional  -Received 1 unit of PRBC on 4/19.  Hemoglobin stable at 9.6 -Iron studies showed ferritin 300, TIBC and saturation not calculated, B12 greater than 1000  Hyponatremia, resolved  COPD -Patient is not acute flare therefore not getting any steroids but patient is getting nebulizer treatments as mentioned above  History of coronary artery disease Atrial fibrillation Abdominal aortic aneurysm without rupture -Home medications currently on hold due to low blood pressure. -Patient is on Coumadin at home, will have pharmacy manage.   Essential hypertension -Antihypertensives on hold  Severe protein calorie malnutrition -Due to his advanced age patient has very poor appetite. -We will add boost to his diet  Palliative care team following.  Poor overall prognosis   DVT prophylaxis: Family insist he remains on coumadin . Code Status: DO NOT RESUSCITATE Family Communication: Another daughter at bedside.  Disposition Plan: Maintain inpatient stay  Consultants:   Palliative care  Procedures:   None  Antimicrobials:   Vancomycin 4/16 > 4/23  Zosyn 4/16>4/23   Subjective: No complaints, resting.   Review of Systems Unable to obtain to to his mentation.   Objective:  Vitals:   06/02/17 2104 06/02/17 2323 06/03/17 0750 06/03/17 1012  BP: 115/63 110/72 102/75   Pulse: 80 88 (!) 111 92  Resp:  19 20 18   Temp: 98 F (36.7 C) 97.8 F (36.6  C) (!) 97.4 F (36.3 C)   TempSrc: Oral Oral Oral   SpO2: 100% 100% 100% 98%  Weight:      Height:        Intake/Output Summary (Last 24 hours) at 06/03/2017 1230 Last data filed at 06/03/2017 0800 Gross per 24 hour  Intake 120 ml  Output 425 ml  Net -305 ml   Filed Weights   05/30/17 0324  Weight: 61.2 kg (134 lb 14.7 oz)    Examination: Constitutional: NAD, calm, comfortable; cachectic frail appearing ; temporal wasting.  Eyes: PERRL, lids and conjunctivae normal ENMT: Mucous membranes are moist. Posterior pharynx clear of any exudate or lesions.Normal dentition.  Neck: normal, supple, no masses, no thyromegaly Respiratory: diffuse corase BS Cardiovascular: Regular rate and rhythm, no murmurs / rubs / gallops. No extremity edema. 2+ pedal pulses. No carotid bruits.  Abdomen: no tenderness, no masses palpated. No hepatosplenomegaly. Bowel sounds positive.  Musculoskeletal: no clubbing / cyanosis. No joint deformity upper and lower extremities. Good ROM, no contractures. Normal muscle tone.  Skin: no rashes, lesions, ulcers. No induration Neurologic: unable to assess.  Psychiatric: Unable to assess   Data Reviewed:   CBC: Recent Labs  Lab 05/30/17 0221 05/31/17 0812 06/01/17 0214 06/02/17 0233 06/03/17 0210  WBC 12.3* 19.1* 17.7* 14.3* 11.2*  HGB 10.0* 10.1* 9.6* 9.4* 9.2*  HCT 30.5* 31.6* 28.0* 29.3* 28.9*  MCV 90.2 93.2 90.6 91.8 92.3  PLT 212 173 159 151 139*   Basic Metabolic Panel: Recent Labs  Lab 05/30/17 0221 05/31/17 0806 06/01/17 0214 06/02/17 0233 06/03/17 0210  NA 138 139 141 142 147*  K 4.2 4.3 3.4* 3.2* 3.9  CL 114* 115* 113* 115* 118*  CO2 17* 15* 20* 21* 21*  GLUCOSE 109* 130* 108* 98 103*  BUN 23* 26* 27* 28* 26*  CREATININE 1.59* 1.73* 1.85* 1.79* 1.68*  CALCIUM 7.8* 7.8* 7.9* 7.8* 7.8*  MG 1.4* 1.8 1.5* 1.3* 1.4*   GFR: Estimated Creatinine Clearance: 24.5 mL/min (A) (by C-G formula based on SCr of 1.68 mg/dL (H)). Liver Function  Tests: No results for input(s): AST, ALT, ALKPHOS, BILITOT, PROT, ALBUMIN in the last 168 hours. No results for input(s): LIPASE, AMYLASE in the last 168 hours. No results for input(s): AMMONIA in the last 168 hours. Coagulation Profile: Recent Labs  Lab 05/29/17 0213 05/30/17 0221 05/31/17 0806 06/01/17 0214 06/03/17 0210  INR 3.19 1.92 1.80 1.63 1.75   Cardiac Enzymes: No results for input(s): CKTOTAL, CKMB, CKMBINDEX, TROPONINI in the last 168 hours. BNP (last 3 results) No results for input(s): PROBNP in the last 8760 hours. HbA1C: No results for input(s): HGBA1C in the last 72 hours. CBG: Recent Labs  Lab 05/31/17 1121  GLUCAP 117*   Lipid Profile: No results for input(s): CHOL, HDL, LDLCALC, TRIG, CHOLHDL, LDLDIRECT in the last 72 hours. Thyroid Function Tests: No results for input(s): TSH, T4TOTAL, FREET4, T3FREE, THYROIDAB in the last 72 hours. Anemia Panel: No results for input(s): VITAMINB12, FOLATE, FERRITIN, TIBC, IRON, RETICCTPCT in the last 72 hours. Sepsis Labs: Recent Labs  Lab 05/29/17 1056 05/30/17 0221 05/31/17 0806 06/02/17 0233  PROCALCITON 0.51 0.36 0.52 0.34    Recent Results (from the past 240 hour(s))  Blood Culture (routine x 2)  Status: None   Collection Time: 05/26/17  4:30 AM  Result Value Ref Range Status   Specimen Description BLOOD RIGHT WRIST  Final   Special Requests   Final    BOTTLES DRAWN AEROBIC AND ANAEROBIC Blood Culture adequate volume   Culture   Final    NO GROWTH 5 DAYS Performed at [facility], [address], [place], [place] [address]    Report Status 05/31/2017 FINAL  Final  Blood Culture (routine x 2)     Status: None   Collection Time: 05/26/17  5:00 AM  Result Value Ref Range Status   Specimen Description BLOOD RIGHT ARM  Final   Special Requests   Final    AEROBIC BOTTLE ONLY Blood Culture results may not be optimal due to an excessive volume of blood received in culture bottles   Culture   Final     NO GROWTH 5 DAYS Performed at [facility], [address], [place], [place] [address]    Report Status 05/31/2017 FINAL  Final  MRSA PCR Screening     Status: Abnormal   Collection Time: 05/27/17 10:45 AM  Result Value Ref Range Status   MRSA by PCR POSITIVE (A) NEGATIVE Final    Comment:        The GeneXpert MRSA Assay (FDA approved for NASAL specimens only), is one component of a comprehensive MRSA colonization surveillance program. It is not intended to diagnose MRSA infection nor to guide or monitor treatment for MRSA infections. RESULT CALLED TO, READ BACK BY AND VERIFIED WITH: Alinda Deem RN 13:20 05/27/17 ([email or web address]) Performed at [facility], [address], [place], [place] [address]          Radiology Studies: No results found.      Scheduled Meds: . acetylcysteine  3 mL Nebulization Once  . arformoterol  15 mcg Nebulization BID  . budesonide  0.5 mg Nebulization BID  . feeding supplement (ENSURE ENLIVE)  237 mL Oral BID BM  . ipratropium-albuterol  3 mL Nebulization BID  . mupirocin ointment   Nasal BID  . scopolamine  1 patch Transdermal Q72H  . warfarin  1 mg Oral ONCE-1800  . Warfarin - Pharmacist Dosing Inpatient   Does not apply q1800   Continuous Infusions: . famotidine (PEPCID) IV Stopped (06/02/17 2205)     LOS: 8 days    Time spent: 20 mins    Ankit Joline Maxcy, MD [facility] Pager [phone]   If 7PM-7AM, please contact night-coverage [email or web address] Password [email or web address] 06/03/2017, 12:30 PM

## 2017-06-03 NOTE — Progress Notes (Signed)
Daily Progress Note   Patient Name: [name]       Date: 06/03/2017 DOB: [birth year]  Age: 84 y.o. MRN#: [id number] Attending Physician: Jonathon Nanas, MD Primary Care Physician: Jonathon Banner, MD Admit Date: 05/26/2017  Reason for Consultation/Follow-up: Establishing goals of care and Hospice Evaluation  Subjective: Opens eyes to physical stimulation, does not speak. Family at bedside.  Length of Stay: 8  Current Medications: Scheduled Meds:  . acetylcysteine  3 mL Nebulization Once  . arformoterol  15 mcg Nebulization BID  . budesonide  0.5 mg Nebulization BID  . feeding supplement (ENSURE ENLIVE)  237 mL Oral BID BM  . ipratropium-albuterol  3 mL Nebulization BID  . mupirocin ointment   Nasal BID  . scopolamine  1 patch Transdermal Q72H  . warfarin  1 mg Oral ONCE-1800  . Warfarin - Pharmacist Dosing Inpatient   Does not apply q1800    Continuous Infusions: . famotidine (PEPCID) IV Stopped (06/02/17 2205)    PRN Meds: acetaminophen **OR** acetaminophen, antiseptic oral rinse, bisacodyl, diphenhydrAMINE, glycopyrrolate **OR** glycopyrrolate **OR** glycopyrrolate, haloperidol **OR** haloperidol **OR** haloperidol lactate, LORazepam **OR** [DISCONTINUED] LORazepam **OR** LORazepam, ondansetron **OR** ondansetron (ZOFRAN) IV, ondansetron **OR** ondansetron (ZOFRAN) IV, polyvinyl alcohol, RESOURCE THICKENUP CLEAR  Physical Exam  Constitutional: He appears lethargic. He has a sickly appearance.  HENT:  Head: Normocephalic and atraumatic.  Cardiovascular: An irregular rhythm present.  Pulmonary/Chest: No accessory muscle usage. No respiratory distress. He has decreased breath sounds.  Abdominal: Soft.  Musculoskeletal:       Right lower leg: He exhibits edema.       Left lower  leg: He exhibits edema.  Neurological: He appears lethargic. He is disoriented.  Skin: Skin is warm and dry.            Vital Signs: BP 102/75 (BP Location: Right Arm)   Pulse 92   Temp (!) 97.4 F (36.3 C) (Oral)   Resp 18   Ht 5\' 3"  (1.6 m) Comment: estimated by family  Wt 61.2 kg (134 lb 14.7 oz)   SpO2 98%   BMI 23.90 kg/m  SpO2: SpO2: 98 % O2 Device: O2 Device: Nasal Cannula O2 Flow Rate: O2 Flow Rate (L/min): 2 L/min  Intake/output summary:   Intake/Output Summary (Last 24 hours) at 06/03/2017 1309 Last data filed at 06/03/2017 0800 Gross per 24 hour  Intake 120 ml  Output 425 ml  Net -305 ml   LBM: Last BM Date: 06/02/17 Baseline Weight: Weight: 61.2 kg (134 lb 14.7 oz) Most recent weight: Weight: 61.2 kg (134 lb 14.7 oz)       Palliative Assessment/Data: PPS 20%    Flowsheet Rows     Most Recent Value  Intake Tab  Referral Department  Hospitalist  Unit at Time of Referral  Med/Surg Unit  Palliative Care Primary Diagnosis  Sepsis/Infectious Disease  Date Notified  05/27/17  Palliative Care Type  New Palliative care  Reason for referral  Clarify Goals of Care  Date of Admission  05/26/17  Date first seen by Palliative Care  05/28/17  # of days Palliative referral response time  1 Day(s)  # of days IP prior to Palliative referral  1  Clinical Assessment  Palliative Performance Scale Score  30%  Psychosocial & Spiritual Assessment  Palliative Care Outcomes  Patient/Family meeting held?  Yes  Who was at the meeting?  daughter  Palliative Care Outcomes  Clarified goals of care, Counseled regarding hospice, Provided psychosocial or spiritual support      Patient Active Problem List   Diagnosis Date Noted  . Goals of care, counseling/discussion   . Palliative care by specialist   . Cystitis 05/26/2017  . Hyperkalemia 05/26/2017  . 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 Cerebral infarction due to embolism of cerebral artery (HCC)   .  Chronic atrial fibrillation (HCC)   . History of CVA (cerebrovascular accident)   . Dysphagia, post-stroke   . Chronic diastolic congestive heart failure (HCC)   . Uncomplicated asthma   . History of gout   . Persistent atrial fibrillation (HCC)   . Aphasia   . HCAP (healthcare-associated pneumonia)   . SOB (shortness of breath)   . PAF (paroxysmal atrial fibrillation) (HCC)   . Diabetes mellitus type 2 in nonobese (HCC)   . Stage 3 chronic kidney disease (HCC)   . Tachypnea   . Benign essential HTN   . Hypokalemia   . Acute blood loss anemia   . Cerebral thrombosis with cerebral infarction 11/27/2016  . Stroke (cerebrum) (HCC) 11/27/2016  . Acute respiratory failure (HCC)   . Stenosis of left carotid artery   . Acute ischemic stroke (HCC) 10/18/2016  . Cerebral infarction due to stenosis of right middle cerebral artery (HCC) 10/10/2016  . CVA (cerebral vascular accident) (HCC) 07/18/2016  . Stroke-like symptoms 07/18/2016  . Stroke (HCC) 07/18/2016  . Anemia 07/18/2016  . Non-English speaking patient 07/18/2016  . COPD, group B, by GOLD 2017 classification (HCC)   . Sepsis (HCC) 10/03/2015  . Nausea, vomiting and diarrhea 10/03/2015  . CKD (chronic kidney disease), stage III (HCC) 07/27/2015  . Carotid artery disease (HCC) 07/27/2015  . CAP (community acquired pneumonia) 07/27/2015  . Hyponatremia 06/18/2014  . Paroxysmal atrial fibrillation (HCC) 11/16/2013  . Ischemic cardiomyopathy 11/16/2013  . Headache 11/16/2013  . Chronic anticoagulation 11/16/2013  . Neck pain on left side 11/16/2013  . Chest pain at rest 11/16/2013  . Coronary artery disease 10/14/2013  . Chronic systolic heart failure (HCC) 10/14/2013  . AAA (abdominal aortic aneurysm) without rupture (HCC) 09/08/2013  . Hyperlipidemia 06/08/2013  . History of stroke 05/02/2013  . Acute renal failure superimposed on stage 3 chronic kidney disease (HCC) 04/29/2013  . PVD (peripheral vascular disease) 3.5cm AAA  Aug 2014 04/29/2013  . NSTEMI - ? type 2 - Troponin 0.63 04/28/2013  . Chronic bilateral lower abdominal pain 02/17/2013  . Hypertension     Palliative Care Assessment & Plan   HPI: 84 y.o.malewith past medical history of T2DM, HTN, CAD, CHF, CVA, CKD3, AAA, COPD, and a fib on coumadinadmitted on 4/16/2019with chest pain.He was found to be hypotensive. Chest x-ray was suggestive of pneumonia. Patient has dysphagia and family insistent upon feeding him.   PMT consulted for goals of care and poor prognosis.  Assessment: Follow-up meeting with daughter. Today she admits patient is not doing well and does not seem to be improving.    We discussed that patient is likely near the end-of-life. We discussed that patient has completed antibiotics, yet is still lethargic and not eating/drinking more than sips. She tells me she understands this. We talked about where she wants him to be for the time  he has left. She tells me he cannot return home because there are 10 people living in a small home. We discussed residential hospice and they type of care that is provided there - she is interested in residential hospice but then tells me she is comfortable at the hospital and wants to stay here. We discussed how hospice specializes in comfort and end of life care. She tells me she wants time to think about things and discuss with other family members.   Recommendations/Plan:  Discussed residential hospice - daughter is interested but wants time to think about it, tells me she is comfortable in the hospital - will likely want to remain in hospital until told otherwise  PMT will continue meeting w/family - tomorrow will attempt discussion to transition to full comfort care  Goals of Care and Additional Recommendations:  Limitations on Scope of Treatment: Initiate Comfort Feeding and No Artificial Feeding  Code Status:  DNR  Prognosis:   < 2 weeks pneumonia, minimally responsive, poor po  intake   Discharge Planning:  To Be Determined  Care plan was discussed with daughter  Thank you for allowing the Palliative Medicine Team to assist in the care of this patient.   Time In: 1230 Time Out: 1300 Total Time 30 minutes Prolonged Time Billed  no       Greater than 50%  of this time was spent counseling and coordinating care related to the above assessment and plan.  Gerlean Ren, DNP, AGNP-C Palliative Medicine Team Team Phone # [phone]

## 2017-06-03 NOTE — Plan of Care (Signed)
Family remains at bedside with patient.  Patient comfortable and resting, no complaints, needs addressed.

## 2017-06-03 NOTE — Progress Notes (Signed)
ANTICOAGULATION CONSULT NOTE - Follow-Up Consult  Pharmacy Consult for warfarin Indication: atrial fibrillation  Allergies  Allergen Reactions  . Levofloxacin Anaphylaxis and Other (See Comments)    Patient told his daughter that it made him "feel worse than he already did" (overall) Headache also (has refused to take anymore)  . Eliquis [Apixaban] Itching and Swelling  . Phenazopyridine Hcl Other (See Comments)    Unknown reaction  . Septra [Sulfamethoxazole-Trimethoprim] Nausea And Vomiting and Other (See Comments)    GI Upset  . Latex Rash  . Tape Rash    Vital Signs: Temp: 97.4 F (36.3 C) (04/24 0750) Temp Source: Oral (04/24 0750) BP: 102/75 (04/24 0750) Pulse Rate: 111 (04/24 0750)  Labs: Recent Labs    05/31/17 0806  06/01/17 0214 06/02/17 0233 06/03/17 0210  HGB  --    < > 9.6* 9.4* 9.2*  HCT  --    < > 28.0* 29.3* 28.9*  PLT  --    < > 159 151 139*  LABPROT 20.7*  --  19.2*  --  20.3*  INR 1.80  --  1.63  --  1.75  CREATININE 1.73*  --  1.85* 1.79* 1.68*   < > = values in this interval not displayed.    Assessment: 84 yo M on Coumadin 1mg  on Sat and no other doses PTA for hx Afib/CVA. Admit INR 3 on 4/16. Outpt INR goal 2.5-3.5?  INR has been trending down since admit and is 1.75 today. Also not really taking PO meds so may not be able to give. Has missed past two doses. GOC also being discussed at this time. Hgb 9.2, plts down to 139.  Goal of Therapy:  INR 2.5-3.5 Monitor platelets by anticoagulation protocol: Yes   Plan:  Give Coumadin 1mg  PO x 1 tonight if able Monitor daily INR, CBC, s/s of bleed  Consider stopping Coumadin with limited scope of treatment and prognosis of less than 2 weeks  Enzo Bi, PharmD, [name] Clinical Pharmacist Pager [phone] 06/03/2017 8:04 AM

## 2017-06-04 DIAGNOSIS — J189 Pneumonia, unspecified organism: Secondary | ICD-10-CM

## 2017-06-04 LAB — BASIC METABOLIC PANEL
ANION GAP: 6 (ref 5–15)
BUN: 26 mg/dL — ABNORMAL HIGH (ref 6–20)
CHLORIDE: 120 mmol/L — AB (ref 101–111)
CO2: 22 mmol/L (ref 22–32)
CREATININE: 1.61 mg/dL — AB (ref 0.61–1.24)
Calcium: 8.1 mg/dL — ABNORMAL LOW (ref 8.9–10.3)
GFR calc non Af Amer: 37 mL/min — ABNORMAL LOW (ref >60)
GFR, EST AFRICAN AMERICAN: 42 mL/min — AB (ref >60)
Glucose, Bld: 101 mg/dL — ABNORMAL HIGH (ref 65–99)
Potassium: 4 mmol/L (ref 3.5–5.1)
SODIUM: 148 mmol/L — AB (ref 135–145)

## 2017-06-04 LAB — CBC
HCT: 32 % — ABNORMAL LOW (ref 39.0–52.0)
HEMOGLOBIN: 10.1 g/dL — AB (ref 13.0–17.0)
MCH: 30 pg (ref 26.0–34.0)
MCHC: 31.6 g/dL (ref 30.0–36.0)
MCV: 95 fL (ref 78.0–100.0)
PLATELETS: 171 10*3/uL (ref 150–400)
RBC: 3.37 MIL/uL — AB (ref 4.22–5.81)
RDW: 20.1 % — ABNORMAL HIGH (ref 11.5–15.5)
WBC: 11.9 10*3/uL — ABNORMAL HIGH (ref 4.0–10.5)

## 2017-06-04 LAB — MAGNESIUM: Magnesium: 1.5 mg/dL — ABNORMAL LOW (ref 1.7–2.4)

## 2017-06-04 LAB — PROTIME-INR
INR: 1.85
Prothrombin Time: 21.2 seconds — ABNORMAL HIGH (ref 11.4–15.2)

## 2017-06-04 MED ORDER — MAGNESIUM OXIDE 400 (241.3 MG) MG PO TABS
800.0000 mg | ORAL_TABLET | Freq: Once | ORAL | Status: AC
  Administered 2017-06-04: 800 mg via ORAL
  Filled 2017-06-04: qty 2

## 2017-06-04 MED ORDER — WARFARIN 0.5 MG HALF TABLET
0.5000 mg | ORAL_TABLET | Freq: Once | ORAL | Status: DC
  Filled 2017-06-04: qty 1

## 2017-06-04 NOTE — Progress Notes (Addendum)
1pm [facility] met with dtr and answered questions- pt dtr still not wanting to consider until siblings are in town and they can visit facility together on saturday  12pm CSW received consult to set up meeting with [facility] liaison and pt daughter- CSW called [employer] and informed of family interest- they will come to room to speak with family  CSW will continue to follow  Jorge Ny, [place] Social Worker [phone]

## 2017-06-04 NOTE — Progress Notes (Signed)
PROGRESS NOTE    [name]  IOX:[id number] DOB: 1926/10/31 DOA: 05/26/2017 PCP: Christain [place], MD   Brief Narrative:   84 year old with a history of diabetes mellitus type 2, essential hypertension, coronary artery disease, unspecified CHF, multiple CVAs was brought to the hospital with complains of chest pain.  Patient was found to be hypotensive initially despite of getting IV fluids but family did not want any aggressive measures besides IV fluids and antibiotics.  Chest x-ray was suggestive of possible community-acquired pneumonia therefore started on IV antibiotics. During the admission I had an extensive discussion with the patient's family member regarding his chronic long-term poor prognosis given his comorbidities and advanced age.  Family insisted to feed him despite aspiration risk but also requested speech and swallow evaluation. Currently ongoing discussions with Palliative care- family in process of exploring hospice option.   Assessment & Plan:   Principal Problem:   Acute respiratory failure (HCC) Active Problems:   Acute renal failure superimposed on stage 3 chronic kidney disease (HCC)   AAA (abdominal aortic aneurysm) without rupture (HCC)   Coronary artery disease   Chest pain at rest   Hyponatremia   Carotid artery disease ([place])   CAP (community acquired pneumonia)   Sepsis ([place])   COPD, group B, by GOLD 2017 classification ([place])   Aphasia   Benign essential HTN   Chronic atrial fibrillation (HCC)   Chronic diastolic congestive heart failure ([place])   Cystitis   Hyperkalemia   Goals of care, counseling/discussion   Palliative care by specialist  Septic shock, improved Multifocal community-acquired pneumonia Acute hypoxic respiratory distress, 95% on 3L [place] - Continue nebulizer treatments every 6 hours, in between as needed if necessary -Patient is currently on vancomycin and Zosyn day 7 - stopped on 4/23 - At this point I explained the daughter and  the patient's condition is very reversible and further medical therapy can be offered.  Palliative care has met with the patient's family and  family is agreeable to explore hospice option.  Family will check on [facility] and let us know.  In the meantime we will hold off on any further blood draws.  Comfort feeding.  IV fluids if necessary while here.  Chronic Kidney Disease Stage III -I believe this has plateued around 1.5-1.7 -We will monitor creatinine daily and urine output  Anemia, of chronic disease and Dilutional  -Received 1 unit of PRBC on 4/19.  Hemoglobin stable at 9.6 -Iron studies showed ferritin 300, TIBC and saturation not calculated, B12 greater than 1000  Hyponatremia, resolved  COPD -Patient is not acute flare therefore not getting any steroids but patient is getting nebulizer treatments as mentioned above  History of coronary artery disease Atrial fibrillation Abdominal aortic aneurysm without rupture -Home medications currently on hold due to low blood pressure. -Patient is on Coumadin at home, will have pharmacy manage.   Essential hypertension -Antihypertensives on hold  Severe protein calorie malnutrition -Due to his advanced age patient has very poor appetite. -We will add boost to his diet   Palliative care team is following.  At this point patient has very poor prognosis  DVT prophylaxis: Family insist he remains on coumadin .  We are unable to give this as patient is not awake enough to take oral medication.  I have notified the family that giving patient any medications while they are not abate its again standard of care. Code Status: DO NOT RESUSCITATE Family Communication: Daughter and wife at bedside Disposition Plan:  Maintain inpatient stay until family makes decision for hospice.  Consultants:   Palliative care  Procedures:   None  Antimicrobials:   Vancomycin 4/16 > 4/23  Zosyn 4/16>4/23   Subjective: No complaints patient is  resting Had a discussion with the family at bedside.  Patient has a sleep and response to verbal and physical stimuli.  Review of Systems Unable to obtain any review of system from the patient due to his mentation  Objective: Vitals:   06/03/17 2324 06/04/17 0730 06/04/17 0731 06/04/17 0929  BP: 115/74 100/62 100/76   Pulse: (!) 108 (!) 107 98   Resp: (!) 22 (!) 25 (!) 25   Temp: 98 F (36.7 C) (!) 97.4 F (36.3 C) (!) 97.4 F (36.3 C)   TempSrc: Oral Oral Axillary   SpO2: 100% 100% 99% 97%  Weight:      Height:        Intake/Output Summary (Last 24 hours) at 06/04/2017 1302 Last data filed at 06/04/2017 0300 Gross per 24 hour  Intake 70 ml  Output 350 ml  Net -280 ml   Filed Weights   05/30/17 0324  Weight: 61.2 kg (134 lb 14.7 oz)    Examination: Constitutional: Cachectic, elderly frail-appearing with bilateral temporal wasting. Eyes: PERRL, lids and conjunctivae normal ENMT: Mucous membranes are DRY. Posterior pharynx clear of any exudate or lesions.Normal dentition.  Neck: normal, supple, no masses, no thyromegaly Respiratory: Diffuse coarse breath sounds Cardiovascular: Regular rate and rhythm, no murmurs / rubs / gallops. No extremity edema. 2+ pedal pulses. No carotid bruits.  Abdomen: no tenderness, no masses palpated. No hepatosplenomegaly. Bowel sounds positive.  Musculoskeletal: no clubbing / cyanosis. No joint deformity upper and lower extremities. Good ROM, no contractures. Normal muscle tone.  Skin: no rashes, lesions, ulcers. No induration Neurologic: Unable to fully assess but grossly moves all his extremities to physical stimuli Psychiatric: Unable to assess due to his mentation.   Data Reviewed:   CBC: Recent Labs  Lab 05/31/17 0812 06/01/17 0214 06/02/17 0233 06/03/17 0210 06/04/17 0405  WBC 19.1* 17.7* 14.3* 11.2* 11.9*  HGB 10.1* 9.6* 9.4* 9.2* 10.1*  HCT 31.6* 28.0* 29.3* 28.9* 32.0*  MCV 93.2 90.6 91.8 92.3 95.0  PLT 173 159 151 139*  622   Basic Metabolic Panel: Recent Labs  Lab 05/31/17 0806 06/01/17 0214 06/02/17 0233 06/03/17 0210 06/04/17 0405  NA 139 141 142 147* 148*  K 4.3 3.4* 3.2* 3.9 4.0  CL 115* 113* 115* 118* 120*  CO2 15* 20* 21* 21* 22  GLUCOSE 130* 108* 98 103* 101*  BUN 26* 27* 28* 26* 26*  CREATININE 1.73* 1.85* 1.79* 1.68* 1.61*  CALCIUM 7.8* 7.9* 7.8* 7.8* 8.1*  MG 1.8 1.5* 1.3* 1.4* 1.5*   GFR: Estimated Creatinine Clearance: 25.5 mL/min (A) (by C-G formula based on SCr of 1.61 mg/dL (H)). Liver Function Tests: No results for input(s): AST, ALT, ALKPHOS, BILITOT, PROT, ALBUMIN in the last 168 hours. No results for input(s): LIPASE, AMYLASE in the last 168 hours. No results for input(s): AMMONIA in the last 168 hours. Coagulation Profile: Recent Labs  Lab 05/30/17 0221 05/31/17 0806 06/01/17 0214 06/03/17 0210 06/04/17 0405  INR 1.92 1.80 1.63 1.75 1.85   Cardiac Enzymes: No results for input(s): CKTOTAL, CKMB, CKMBINDEX, TROPONINI in the last 168 hours. BNP (last 3 results) No results for input(s): PROBNP in the last 8760 hours. HbA1C: No results for input(s): HGBA1C in the last 72 hours. CBG: Recent Labs  Lab 05/31/17 1121  GLUCAP 117*   Lipid Profile: No results for input(s): CHOL, HDL, LDLCALC, TRIG, CHOLHDL, LDLDIRECT in the last 72 hours. Thyroid Function Tests: No results for input(s): TSH, T4TOTAL, FREET4, T3FREE, THYROIDAB in the last 72 hours. Anemia Panel: No results for input(s): VITAMINB12, FOLATE, FERRITIN, TIBC, IRON, RETICCTPCT in the last 72 hours. Sepsis Labs: Recent Labs  Lab 05/29/17 1056 05/30/17 0221 05/31/17 0806 06/02/17 0233  PROCALCITON 0.51 0.36 0.52 0.34    Recent Results (from the past 240 hour(s))  Blood Culture (routine x 2)     Status: None   Collection Time: 05/26/17  4:30 AM  Result Value Ref Range Status   Specimen Description BLOOD RIGHT WRIST  Final   Special Requests   Final    BOTTLES DRAWN AEROBIC AND ANAEROBIC Blood  Culture adequate volume   Culture   Final    NO GROWTH 5 DAYS Performed at [facility], [address], [place], [place] [address]    Report Status 05/31/2017 FINAL  Final  Blood Culture (routine x 2)     Status: None   Collection Time: 05/26/17  5:00 AM  Result Value Ref Range Status   Specimen Description BLOOD RIGHT ARM  Final   Special Requests   Final    AEROBIC BOTTLE ONLY Blood Culture results may not be optimal due to an excessive volume of blood received in culture bottles   Culture   Final    NO GROWTH 5 DAYS Performed at [facility], [place] [address] [place], [name] [address]    Report Status 05/31/2017 FINAL  Final  MRSA PCR Screening     Status: Abnormal   Collection Time: 05/27/17 10:45 AM  Result Value Ref Range Status   MRSA by PCR POSITIVE (A) NEGATIVE Final    Comment:        The GeneXpert MRSA Assay (FDA approved for NASAL specimens only), is one component of a comprehensive MRSA colonization surveillance program. It is not intended to diagnose MRSA infection nor to guide or monitor treatment for MRSA infections. RESULT CALLED TO, READ BACK BY AND VERIFIED WITH: Malachy Chamber RN 13:20 05/27/17 ([email or web address]) Performed at [facility], [place] [address] [place], [place] [address]          Radiology Studies: No results found.      Scheduled Meds: . acetylcysteine  3 mL Nebulization Once  . arformoterol  15 mcg Nebulization BID  . budesonide  0.5 mg Nebulization BID  . feeding supplement (ENSURE ENLIVE)  237 mL Oral BID BM  . ipratropium-albuterol  3 mL Nebulization BID  . mupirocin ointment   Nasal BID  . scopolamine  1 patch Transdermal Q72H  . warfarin  0.5 mg Oral ONCE-1800  . Warfarin - Pharmacist Dosing Inpatient   Does not apply q1800   Continuous Infusions: . famotidine (PEPCID) IV 20 mg (06/03/17 2152)     LOS: 9 days    Time spent: 20 mins    Ankit Arsenio Loader, MD [facility] Pager [phone]   If  7PM-7AM, please contact night-coverage [email or web address] Password [email or web address] 06/04/2017, 1:02 PM

## 2017-06-04 NOTE — Progress Notes (Signed)
Daily Progress Note   Patient Name: [name]       Date: 06/04/2017 DOB: 07-26-1926  Age: 84 y.o. MRN#: [id number] Attending Physician: Dimple Nanas, MD Primary Care Physician: [name], MD Admit Date: 05/26/2017  Reason for Consultation/Follow-up: Establishing goals of care, Hospice Evaluation and Psychosocial/spiritual support  Subjective: Patient flutters eyes to sternal rub, non-verbal, daughter and wife at bedside  Length of Stay: 9  Current Medications: Scheduled Meds:  . acetylcysteine  3 mL Nebulization Once  . arformoterol  15 mcg Nebulization BID  . budesonide  0.5 mg Nebulization BID  . feeding supplement (ENSURE ENLIVE)  237 mL Oral BID BM  . ipratropium-albuterol  3 mL Nebulization BID  . mupirocin ointment   Nasal BID  . scopolamine  1 patch Transdermal Q72H  . warfarin  0.5 mg Oral ONCE-1800  . Warfarin - Pharmacist Dosing Inpatient   Does not apply q1800    Continuous Infusions: . famotidine (PEPCID) IV 20 mg (06/03/17 2152)    PRN Meds: acetaminophen **OR** acetaminophen, antiseptic oral rinse, bisacodyl, diphenhydrAMINE, glycopyrrolate **OR** glycopyrrolate **OR** glycopyrrolate, haloperidol **OR** haloperidol **OR** haloperidol lactate, LORazepam **OR** [DISCONTINUED] LORazepam **OR** LORazepam, ondansetron **OR** ondansetron (ZOFRAN) IV, ondansetron **OR** ondansetron (ZOFRAN) IV, polyvinyl alcohol, RESOURCE THICKENUP CLEAR  Physical Exam  Constitutional: He appears ill. No distress.  Flutters eyes to sternal rub  HENT:  Head: Normocephalic and atraumatic.  Cardiovascular: An irregular rhythm present.  Pulmonary/Chest: No respiratory distress. He has rhonchi in the right upper field and the left upper field.  Abdominal: Soft.  Musculoskeletal:     Right lower leg: He exhibits edema.       Left lower leg: He exhibits edema.  Neurological: He is disoriented.  Skin: Skin is warm and dry.            Vital Signs: BP 100/76 (BP Location: Right Arm)   Pulse 98   Temp (!) 97.4 F (36.3 C) (Axillary)   Resp (!) 25   Ht 5\' 3"  (1.6 m) Comment: estimated by family  Wt 61.2 kg (134 lb 14.7 oz)   SpO2 97%   BMI 23.90 kg/m  SpO2: SpO2: 97 % O2 Device: O2 Device: Nasal Cannula O2 Flow Rate: O2 Flow Rate (L/min): 3 L/min  Intake/output summary:   Intake/Output Summary (Last 24 hours) at 06/04/2017 1041 Last data filed at 06/04/2017 0300 Gross per 24 hour  Intake 70 ml  Output 350 ml  Net -280 ml   LBM: Last BM Date: 06/03/17 Baseline Weight: Weight: 61.2 kg (134 lb 14.7 oz) Most recent weight: Weight: 61.2 kg (134 lb 14.7 oz)       Palliative Assessment/Data: PPS 20%    Flowsheet Rows     Most Recent Value  Intake Tab  Referral Department  Hospitalist  Unit at Time of Referral  Med/Surg Unit  Palliative Care Primary Diagnosis  Sepsis/Infectious Disease  Date Notified  05/27/17  Palliative Care Type  New Palliative care  Reason for referral  Clarify Goals of Care  Date of Admission  05/26/17  Date first seen by Palliative Care  05/28/17  # of days Palliative referral response time  1 Day(s)  # of days IP  prior to Palliative referral  1  Clinical Assessment  Palliative Performance Scale Score  30%  Psychosocial & Spiritual Assessment  Palliative Care Outcomes  Patient/Family meeting held?  Yes  Who was at the meeting?  daughter  Palliative Care Outcomes  Clarified goals of care, Counseled regarding hospice, Provided psychosocial or spiritual support      Patient Active Problem List   Diagnosis Date Noted  . Goals of care, counseling/discussion   . Palliative care by specialist   . Cystitis 05/26/2017  . Hyperkalemia 05/26/2017  . 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 Cerebral infarction due to  embolism of cerebral artery (HCC)   . Chronic atrial fibrillation (HCC)   . History of CVA (cerebrovascular accident)   . Dysphagia, post-stroke   . Chronic diastolic congestive heart failure (HCC)   . Uncomplicated asthma   . History of gout   . Persistent atrial fibrillation (HCC)   . Aphasia   . HCAP (healthcare-associated pneumonia)   . SOB (shortness of breath)   . PAF (paroxysmal atrial fibrillation) (HCC)   . Diabetes mellitus type 2 in nonobese (HCC)   . Stage 3 chronic kidney disease (HCC)   . Tachypnea   . Benign essential HTN   . Hypokalemia   . Acute blood loss anemia   . Cerebral thrombosis with cerebral infarction 11/27/2016  . Stroke (cerebrum) (HCC) 11/27/2016  . Acute respiratory failure (HCC)   . Stenosis of left carotid artery   . Acute ischemic stroke (HCC) 10/18/2016  . Cerebral infarction due to stenosis of right middle cerebral artery (HCC) 10/10/2016  . CVA (cerebral vascular accident) (HCC) 07/18/2016  . Stroke-like symptoms 07/18/2016  . Stroke (HCC) 07/18/2016  . Anemia 07/18/2016  . Non-English speaking patient 07/18/2016  . COPD, group B, by GOLD 2017 classification (HCC)   . Sepsis (HCC) 10/03/2015  . Nausea, vomiting and diarrhea 10/03/2015  . CKD (chronic kidney disease), stage III (HCC) 07/27/2015  . Carotid artery disease (HCC) 07/27/2015  . CAP (community acquired pneumonia) 07/27/2015  . Hyponatremia 06/18/2014  . Paroxysmal atrial fibrillation (HCC) 11/16/2013  . Ischemic cardiomyopathy 11/16/2013  . Headache 11/16/2013  . Chronic anticoagulation 11/16/2013  . Neck pain on left side 11/16/2013  . Chest pain at rest 11/16/2013  . Coronary artery disease 10/14/2013  . Chronic systolic heart failure (HCC) 10/14/2013  . AAA (abdominal aortic aneurysm) without rupture (HCC) 09/08/2013  . Hyperlipidemia 06/08/2013  . History of stroke 05/02/2013  . Acute renal failure superimposed on stage 3 chronic kidney disease (HCC) 04/29/2013  . PVD  (peripheral vascular disease) 3.5cm AAA Aug 2014 04/29/2013  . NSTEMI - ? type 2 - Troponin 0.63 04/28/2013  . Chronic bilateral lower abdominal pain 02/17/2013  . Hypertension     Palliative Care Assessment & Plan   HPI: 84 y.o.malewith past medical history of T2DM, HTN, CAD, CHF, CVA, CKD3, AAA, COPD, and a fib on coumadinadmitted on 4/16/2019with chest pain.He was found to be hypotensive. Chest x-ray was suggestive of pneumonia. Patient has dysphagia and family insistent upon feeding him.   PMT consulted for goals of care and poor prognosis.  Assessment: Follow-up meeting with daughter - long discussion about her father's prognosis. She tells me she knows he is near the end of life and she thinks he knows it as well. His responses to her have significantly reduced and she can no longer get him to eat/drink. She tells me her fears of him "starving to death". We discussed  as the body shuts down there is decreased need for caloric intake and to focus on his comfort by swabbing out his mouth.   We talked about where she wants him to be for the time he has left. She tells me he cannot return home because there are 10 people living in a small home. We discussed residential hospice and the type of care that is provided there - she is interested in residential hospice but then tells me she is comfortable at the hospital and wants to stay here. We discussed how hospice specializes in comfort and end of life care. She tells me she wants to speak to hospice liaison and tour facility. Will consult social work.  We discussed transitioning care to full comfort - such as d/c labs. She tells me she is afraid of this. "I know he is dying, but it is hard to stop".  Questions and concerns addressed, emotional support provided.  Contact information provided to daughter.  Recommendations/Plan:  Social work consult potential hospice placement - daughter would like to speak to liaison and tour  facility  PMT will continue to support family, guide discussions regarding residential hospice  Goals of Care and Additional Recommendations:  Limitations on Scope of Treatment: Initiate Comfort Feeding and No Artificial Feeding  Code Status:  DNR  Prognosis:   < 2 weeks pneumonia, minimally responsive, minimal PO intake  Discharge Planning:  To Be Determined  Care plan was discussed with patient's daughter, bedside RN, Dr. Nelson Chimes  Thank you for allowing the Palliative Medicine Team to assist in the care of this patient.   Time In: 1000 Time Out: 1040 Total Time 40 minutes Prolonged Time Billed  no       Greater than 50%  of this time was spent counseling and coordinating care related to the above assessment and plan.  Gerlean Ren, DNP, AGNP-C Palliative Medicine Team Team Phone # [phone]

## 2017-06-04 NOTE — Progress Notes (Signed)
ANTICOAGULATION CONSULT NOTE - Follow-Up Consult  Pharmacy Consult for warfarin Indication: atrial fibrillation  Allergies  Allergen Reactions  . Levofloxacin Anaphylaxis and Other (See Comments)    Patient told his daughter that it made him "feel worse than he already did" (overall) Headache also (has refused to take anymore)  . Eliquis [Apixaban] Itching and Swelling  . Phenazopyridine Hcl Other (See Comments)    Unknown reaction  . Septra [Sulfamethoxazole-Trimethoprim] Nausea And Vomiting and Other (See Comments)    GI Upset  . Latex Rash  . Tape Rash    Vital Signs: Temp: 97.4 F (36.3 C) (04/25 0731) Temp Source: Axillary (04/25 0731) BP: 100/76 (04/25 0731) Pulse Rate: 98 (04/25 0731)  Labs: Recent Labs    06/02/17 0233 06/03/17 0210 06/04/17 0405  HGB 9.4* 9.2* 10.1*  HCT 29.3* 28.9* 32.0*  PLT 151 139* 171  LABPROT  --  20.3* 21.2*  INR  --  1.75 1.85  CREATININE 1.79* 1.68* 1.61*    Assessment: 84 yo M on Coumadin 1mg  on Sat and no other doses PTA for hx Afib/CVA. Admit INR 3 on 4/16. Outpt INR goal 2.5-3.5?  Noted that the patient is not really taking PO meds so may not be able to give - has missed several doses this admit. GOC also being discussed at this time. Hgb 10.1, plts down to 171.  Goal of Therapy:  INR 2.5-3.5 Monitor platelets by anticoagulation protocol: Yes   Plan:  Give Coumadin 0.5 mg PO x 1 tonight if able Monitor daily INR, CBC, s/s of bleed  Consider stopping Coumadin with limited scope of treatment and prognosis of less than 2 weeks  Thank you for allowing pharmacy to be a part of this patient's care.  Georgina Pillion, PharmD, BCPS Clinical Pharmacist Pager: [phone] Clinical phone for 06/04/2017 from 7a-3:30p: [phone] If after 3:30p, please call main pharmacy at: [phone] 06/04/2017 8:09 AM

## 2017-06-05 MED ORDER — MAGNESIUM SULFATE 2 GM/50ML IV SOLN
2.0000 g | Freq: Once | INTRAVENOUS | Status: AC
  Administered 2017-06-05: 2 g via INTRAVENOUS
  Filled 2017-06-05: qty 50

## 2017-06-05 NOTE — Progress Notes (Signed)
Daily Progress Note   Patient Name: [name]       Date: 06/05/2017 DOB: 1926-05-26  Age: 84 y.o. MRN#: [id number] Attending Physician: Noralee Stain, DO Primary Care Physician: [name], MD Admit Date: 05/26/2017  Reason for Consultation/Follow-up: Establishing goals of care and Hospice Evaluation  Subjective: Patient flutters eyes to physical stimulation, does not speak.   Length of Stay: 10  Current Medications: Scheduled Meds:  . arformoterol  15 mcg Nebulization BID  . budesonide  0.5 mg Nebulization BID  . feeding supplement (ENSURE ENLIVE)  237 mL Oral BID BM  . ipratropium-albuterol  3 mL Nebulization BID  . mupirocin ointment   Nasal BID  . scopolamine  1 patch Transdermal Q72H    Continuous Infusions: . famotidine (PEPCID) IV Stopped (06/04/17 2155)    PRN Meds: acetaminophen **OR** acetaminophen, antiseptic oral rinse, bisacodyl, diphenhydrAMINE, glycopyrrolate **OR** glycopyrrolate **OR** glycopyrrolate, haloperidol **OR** haloperidol **OR** haloperidol lactate, LORazepam **OR** [DISCONTINUED] LORazepam **OR** LORazepam, [DISCONTINUED] ondansetron **OR** ondansetron (ZOFRAN) IV, polyvinyl alcohol, RESOURCE THICKENUP CLEAR  Physical Exam  Constitutional: He appears ill.  HENT:  Head: Normocephalic and atraumatic.  Cardiovascular: An irregular rhythm present.  Pulmonary/Chest: Accessory muscle usage present. No respiratory distress.  Abdominal: Soft. Bowel sounds are normal.  Musculoskeletal:       Right forearm: He exhibits edema.       Left forearm: He exhibits edema.       Right lower leg: He exhibits edema.       Left lower leg: He exhibits edema.  Neurological: He is unresponsive.  Skin: Skin is warm and dry.            Vital Signs: BP 112/73   Pulse  (!) 111   Temp (!) 97.4 F (36.3 C) (Oral)   Resp (!) 22   Ht 5\' 3"  (1.6 m) Comment: estimated by family  Wt 61.2 kg (134 lb 14.7 oz)   SpO2 100%   BMI 23.90 kg/m  SpO2: SpO2: 100 % O2 Device: O2 Device: Nasal Cannula O2 Flow Rate: O2 Flow Rate (L/min): 3 L/min  Intake/output summary:   Intake/Output Summary (Last 24 hours) at 06/05/2017 1452 Last data filed at 06/05/2017 0539 Gross per 24 hour  Intake 50 ml  Output 400 ml  Net -350 ml   LBM: Last BM Date: 06/03/17 Baseline Weight: Weight: 61.2 kg (134 lb 14.7 oz) Most recent weight: Weight: 61.2 kg (134 lb 14.7 oz)       Palliative Assessment/Data: PPS 10%    Flowsheet Rows     Most Recent Value  Intake Tab  Referral Department  Hospitalist  Unit at Time of Referral  Med/Surg Unit  Palliative Care Primary Diagnosis  Sepsis/Infectious Disease  Date Notified  05/27/17  Palliative Care Type  New Palliative care  Reason for referral  Clarify Goals of Care  Date of Admission  05/26/17  Date first seen by Palliative Care  05/28/17  # of days Palliative referral response time  1 Day(s)  # of days IP prior to Palliative referral  1  Clinical Assessment  Palliative Performance Scale Score  20%  Psychosocial & Spiritual Assessment  Palliative Care Outcomes  Patient/Family meeting held?  Yes  Who was at the meeting?  daughter  Palliative Care Outcomes  Clarified goals of care, Counseled regarding hospice, Provided psychosocial or spiritual support  Patient/Family wishes: Interventions discontinued/not started   Tube feedings/TPN, Mechanical Ventilation      Patient Active Problem List   Diagnosis Date Noted  . Goals of care, counseling/discussion   . Palliative care by specialist   . Cystitis 05/26/2017  . Hyperkalemia 05/26/2017  . 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 Cerebral infarction due to embolism of cerebral artery (HCC)   . Chronic atrial fibrillation (HCC)   . History of CVA  (cerebrovascular accident)   . Dysphagia, post-stroke   . Chronic diastolic congestive heart failure (HCC)   . Uncomplicated asthma   . History of gout   . Persistent atrial fibrillation (HCC)   . Aphasia   . HCAP (healthcare-associated pneumonia)   . SOB (shortness of breath)   . PAF (paroxysmal atrial fibrillation) (HCC)   . Diabetes mellitus type 2 in nonobese (HCC)   . Stage 3 chronic kidney disease (HCC)   . Tachypnea   . Benign essential HTN   . Hypokalemia   . Acute blood loss anemia   . Cerebral thrombosis with cerebral infarction 11/27/2016  . Stroke (cerebrum) (HCC) 11/27/2016  . Acute respiratory failure (HCC)   . Stenosis of left carotid artery   . Acute ischemic stroke (HCC) 10/18/2016  . Cerebral infarction due to stenosis of right middle cerebral artery (HCC) 10/10/2016  . CVA (cerebral vascular accident) (HCC) 07/18/2016  . Stroke-Robinson symptoms 07/18/2016  . Stroke (HCC) 07/18/2016  . Anemia 07/18/2016  . Non-English speaking patient 07/18/2016  . COPD, group B, by GOLD 2017 classification (HCC)   . Sepsis (HCC) 10/03/2015  . Nausea, vomiting and diarrhea 10/03/2015  . CKD (chronic kidney disease), stage III (HCC) 07/27/2015  . Carotid artery disease (HCC) 07/27/2015  . CAP (community acquired pneumonia) 07/27/2015  . Hyponatremia 06/18/2014  . Paroxysmal atrial fibrillation (HCC) 11/16/2013  . Ischemic cardiomyopathy 11/16/2013  . Headache 11/16/2013  . Chronic anticoagulation 11/16/2013  . Neck pain on left side 11/16/2013  . Chest pain at rest 11/16/2013  . Coronary artery disease 10/14/2013  . Chronic systolic heart failure (HCC) 10/14/2013  . AAA (abdominal aortic aneurysm) without rupture (HCC) 09/08/2013  . Hyperlipidemia 06/08/2013  . History of stroke 05/02/2013  . Acute renal failure superimposed on stage 3 chronic kidney disease (HCC) 04/29/2013  . PVD (peripheral vascular disease) 3.5cm AAA Aug 2014 04/29/2013  . NSTEMI - ? type 2 - Troponin  0.63 04/28/2013  . Chronic bilateral lower abdominal pain 02/17/2013  . Hypertension     Palliative Care Assessment & Plan   HPI: 84 y.o.malewith past medical history of T2DM, HTN, CAD, CHF, CVA, CKD3, AAA, COPD, and a fib on coumadinadmitted on 4/16/2019with chest pain.He was found to be hypotensive. Chest x-ray was suggestive of pneumonia. Patient has dysphagia and family insistent upon feeding him.   PMT consulted for goals of care and poor prognosis.  Assessment: Upon entering the room, patient's daughter is distressed. She tells me patient has not spoken to her and that he will not eat or drink. She tells me she wants to make sure she has done everything she can for her father. I explain that it appears she has and has taken excellent care of him.   She asks me about a feeding tube for him - I explained that I did not recommend this for  him and explained risks/benefits. She asks why he is not eating and I explained that his body was dying and that is part of the dying process.   Again, we discussed residential hospice. Today she is afraid of going there and is considering taking him home with hospice. (yesterday she told me taking him home was not an option). She asks if he can stay in the hospital until he passes and I explained how hospice is specifically designed for end of life care. Answered multiple questions about services provided by hospice. She asked about many different scenarios and how the patient would be treated.  Questions and concerns addressed. Emotional support provided.  Recommendations/Plan:  GOC unclear - daughter is distraught about making a disposition decision - today she talks as though she prefers to take him home w/hospice but wants to talk about this with the rest of her family tonight. Yesterday, she was clear that home w/hospice was not an option.  PMT will continue to support family, guide discussions regarding residential hospice  Goals of Care  and Additional Recommendations:  Limitations on Scope of Treatment: Initiate Comfort Feeding and No Artificial Feeding  Code Status:  DNR  Prognosis:   < 2 weeks  Discharge Planning:  To Be Determined  Care plan was discussed with patient's daughter  Thank you for allowing the Palliative Medicine Team to assist in the care of this patient.   Time In: 1400 Time Out: 1435 Total Time 35 minutes Prolonged Time Billed  no       Greater than 50%  of this time was spent counseling and coordinating care related to the above assessment and plan.  Gerlean Ren, DNP, AGNP-C Palliative Medicine Team Team Phone # [phone]

## 2017-06-05 NOTE — Discharge Summary (Signed)
PROGRESS NOTE    [name]  HNG:[id number] DOB: 1926/09/17 DOA: 05/26/2017 PCP: [name], MD     Brief Narrative:  [name] is a 84 year old with a history of diabetes mellitus type 2, essential hypertension, coronary artery disease, CHF, multiple CVAs who was brought to the hospital with complains of chest pain.  Patient was found to be hypotensive initially despite of getting IV fluids but family did not want any aggressive measures besides IV fluids and antibiotics.  Chest x-ray was suggestive of possible community-acquired pneumonia therefore started on IV antibiotics.  He has chronic long-term poor prognosis given his comorbidities, advanced age, aspiration risk.  Palliative care was consulted.  Family currently exploring residential hospice options.  Assessment & Plan:   Principal Problem:   Acute respiratory failure (HCC) Active Problems:   Acute renal failure superimposed on stage 3 chronic kidney disease (HCC)   AAA (abdominal aortic aneurysm) without rupture (HCC)   Coronary artery disease   Chest pain at rest   Hyponatremia   Carotid artery disease (HCC)   CAP (community acquired pneumonia)   Sepsis (HCC)   COPD, group B, by GOLD 2017 classification (HCC)   Aphasia   Benign essential HTN   Chronic atrial fibrillation (HCC)   Chronic diastolic congestive heart failure (HCC)   Cystitis   Hyperkalemia   Goals of care, counseling/discussion   Palliative care by specialist   Septic shock secondary to multifocal community-acquired pneumonia as well as aspiration pneumonia -Blood cultures negative at 5 days  -Completed 5 day antibiotic treatment -Sepsis pathology improved. Leukocytosis improving   Acute hypoxic respiratory failure -On [place] O2   CKD stage III -Baseline 1.5-1.6 -Stable   Hypernatremia -Due to poor PO intake  COPD -Not in acute exacerbation   History of coronary artery disease Atrial fibrillation Abdominal aortic  aneurysm without rupture -Home medications currently on hold due to marginal BP  -Coumadin on hold   Essential hypertension -Home medications currently on hold due to marginal BP   Severe protein calorie malnutrition -Dietitian consulted   Hypomagnesemia -Replace, trend    DVT prophylaxis: SCD Code Status: DNR Family Communication: At bedside but wife does not speak English Disposition Plan: Pending hospice decision, family planning to visit facility on Saturday    Consultants:   Palliative care  Procedures:   None   Antimicrobials:  Anti-infectives (From admission, onward)   Start     Dose/Rate Route Frequency Ordered Stop   05/29/17 1400  piperacillin-tazobactam (ZOSYN) IVPB 3.375 g  Status:  Discontinued     3.375 g 12.5 mL/hr over 240 Minutes Intravenous Every 8 hours 05/29/17 0830 06/02/17 1144   05/29/17 1000  vancomycin (VANCOCIN) 500 mg in sodium chloride 0.9 % 100 mL IVPB     500 mg 100 mL/hr over 60 Minutes Intravenous Every 24 hours 05/29/17 0830 06/02/17 2359   05/28/17 0600  vancomycin (VANCOCIN) 500 mg in sodium chloride 0.9 % 100 mL IVPB  Status:  Discontinued     500 mg 100 mL/hr over 60 Minutes Intravenous Every 48 hours 05/26/17 0805 05/29/17 0830   05/26/17 1400  piperacillin-tazobactam (ZOSYN) IVPB 2.25 g  Status:  Discontinued     2.25 g 100 mL/hr over 30 Minutes Intravenous Every 8 hours 05/26/17 0805 05/29/17 0830   05/26/17 1100  fluconazole (DIFLUCAN) IVPB 100 mg  Status:  Discontinued     100 mg 50 mL/hr over 60 Minutes Intravenous Every 24 hours 05/26/17 1026 05/26/17 1101   05/26/17 1000  fluconazole (DIFLUCAN) tablet 100 mg  Status:  Discontinued     100 mg Oral Daily 05/26/17 0731 05/26/17 1026   05/26/17 0500  piperacillin-tazobactam (ZOSYN) IVPB 3.375 g     3.375 g 100 mL/hr over 30 Minutes Intravenous  Once 05/26/17 0455 05/26/17 0553   05/26/17 0500  vancomycin (VANCOCIN) IVPB 1000 mg/200 mL premix     1,000 mg 200 mL/hr over  60 Minutes Intravenous  Once 05/26/17 0455 05/26/17 0807       Subjective: Patient unresponsive in bed this morning  Objective: Vitals:   06/04/17 2314 06/05/17 0502 06/05/17 0539 06/05/17 0825  BP: 96/60 111/77  112/73  Pulse: (!) 101 (!) 130 (!) 118 (!) 111  Resp: 20 (!) 22  (!) 22  Temp: 98.3 F (36.8 C) 98.4 F (36.9 C)  (!) 97.4 F (36.3 C)  TempSrc: Axillary Axillary  Oral  SpO2: 100% 100%  100%  Weight:      Height:        Intake/Output Summary (Last 24 hours) at 06/05/2017 1338 Last data filed at 06/05/2017 0539 Gross per 24 hour  Intake 50 ml  Output 400 ml  Net -350 ml   Filed Weights   05/30/17 0324  Weight: 61.2 kg (134 lb 14.7 oz)    Examination:  General exam: Appears calm and comfortable  Respiratory system: Clear to auscultation anteriorly. Respiratory effort normal. Cardiovascular system: S1 & S2 heard, irregular rhythm. No JVD, murmurs, rubs, gallops or clicks. No pedal edema. Gastrointestinal system: Abdomen is nondistended, soft and nontender. No organomegaly or masses felt. Normal bowel sounds heard. Central nervous system: Somnolent Extremities: Symmetric   Data Reviewed: I have personally reviewed following labs and imaging studies  CBC: Recent Labs  Lab 05/31/17 0812 06/01/17 0214 06/02/17 0233 06/03/17 0210 06/04/17 0405  WBC 19.1* 17.7* 14.3* 11.2* 11.9*  HGB 10.1* 9.6* 9.4* 9.2* 10.1*  HCT 31.6* 28.0* 29.3* 28.9* 32.0*  MCV 93.2 90.6 91.8 92.3 95.0  PLT 173 159 151 139* 171   Basic Metabolic Panel: Recent Labs  Lab 05/31/17 0806 06/01/17 0214 06/02/17 0233 06/03/17 0210 06/04/17 0405  NA 139 141 142 147* 148*  K 4.3 3.4* 3.2* 3.9 4.0  CL 115* 113* 115* 118* 120*  CO2 15* 20* 21* 21* 22  GLUCOSE 130* 108* 98 103* 101*  BUN 26* 27* 28* 26* 26*  CREATININE 1.73* 1.85* 1.79* 1.68* 1.61*  CALCIUM 7.8* 7.9* 7.8* 7.8* 8.1*  MG 1.8 1.5* 1.3* 1.4* 1.5*   GFR: Estimated Creatinine Clearance: 25.5 mL/min (A) (by C-G formula  based on SCr of 1.61 mg/dL (H)). Liver Function Tests: No results for input(s): AST, ALT, ALKPHOS, BILITOT, PROT, ALBUMIN in the last 168 hours. No results for input(s): LIPASE, AMYLASE in the last 168 hours. No results for input(s): AMMONIA in the last 168 hours. Coagulation Profile: Recent Labs  Lab 05/30/17 0221 05/31/17 0806 06/01/17 0214 06/03/17 0210 06/04/17 0405  INR 1.92 1.80 1.63 1.75 1.85   Cardiac Enzymes: No results for input(s): CKTOTAL, CKMB, CKMBINDEX, TROPONINI in the last 168 hours. BNP (last 3 results) No results for input(s): PROBNP in the last 8760 hours. HbA1C: No results for input(s): HGBA1C in the last 72 hours. CBG: Recent Labs  Lab 05/31/17 1121  GLUCAP 117*   Lipid Profile: No results for input(s): CHOL, HDL, LDLCALC, TRIG, CHOLHDL, LDLDIRECT in the last 72 hours. Thyroid Function Tests: No results for input(s): TSH, T4TOTAL, FREET4, T3FREE, THYROIDAB in the last 72 hours. Anemia Panel: No  results for input(s): VITAMINB12, FOLATE, FERRITIN, TIBC, IRON, RETICCTPCT in the last 72 hours. Sepsis Labs: Recent Labs  Lab 05/30/17 0221 05/31/17 0806 06/02/17 0233  PROCALCITON 0.36 0.52 0.34    Recent Results (from the past 240 hour(s))  MRSA PCR Screening     Status: Abnormal   Collection Time: 05/27/17 10:45 AM  Result Value Ref Range Status   MRSA by PCR POSITIVE (A) NEGATIVE Final    Comment:        The GeneXpert MRSA Assay (FDA approved for NASAL specimens only), is one component of a comprehensive MRSA colonization surveillance program. It is not intended to diagnose MRSA infection nor to guide or monitor treatment for MRSA infections. RESULT CALLED TO, READ BACK BY AND VERIFIED WITH: Alinda Deem RN 13:20 05/27/17 ([email or web address]) Performed at [facility], [address] [place], [place] [address]        Radiology Studies: No results found.    Scheduled Meds: . arformoterol  15 mcg Nebulization BID  . budesonide  0.5 mg  Nebulization BID  . feeding supplement (ENSURE ENLIVE)  237 mL Oral BID BM  . ipratropium-albuterol  3 mL Nebulization BID  . mupirocin ointment   Nasal BID  . scopolamine  1 patch Transdermal Q72H   Continuous Infusions: . famotidine (PEPCID) IV Stopped (06/04/17 2155)     LOS: 10 days    Time spent: 30 minutes   Noralee Stain, DO [facility] [email or web address] Password [facility] 06/05/2017, 1:38 PM

## 2017-06-06 NOTE — Progress Notes (Signed)
PROGRESS NOTE    [name]  ZOX:[id number] DOB: [birth year] DOA: 05/26/2017 PCP: [name], MD     Brief Narrative:  [name] is a 84 year old with a history of diabetes mellitus type 2, essential hypertension, coronary artery disease, CHF, multiple CVAs who was brought to the hospital with complains of chest pain.  Patient was found to be hypotensive initially despite of getting IV fluids but family did not want any aggressive measures besides IV fluids and antibiotics.  Chest x-ray was suggestive of possible community-acquired pneumonia therefore started on IV antibiotics.  He has chronic long-term poor prognosis given his comorbidities, advanced age, aspiration risk.  Palliative care was consulted.  Family currently exploring hospice options.   Assessment & Plan:   Principal Problem:   Acute respiratory failure (HCC) Active Problems:   Acute renal failure superimposed on stage 3 chronic kidney disease (HCC)   AAA (abdominal aortic aneurysm) without rupture (HCC)   Coronary artery disease   Chest pain at rest   Hyponatremia   Carotid artery disease (HCC)   CAP (community acquired pneumonia)   Sepsis (HCC)   COPD, group B, by GOLD 2017 classification (HCC)   Aphasia   Benign essential HTN   Chronic atrial fibrillation (HCC)   Chronic diastolic congestive heart failure (HCC)   Cystitis   Hyperkalemia   Goals of care, counseling/discussion   Palliative care by specialist   Septic shock secondary to multifocal community-acquired pneumonia as well as aspiration pneumonia -Blood cultures negative at 5 days  -Completed 5 day antibiotic treatment  Acute hypoxic respiratory failure -On [place] O2   CKD stage III -Baseline 1.5-1.6  Hypernatremia -Due to poor PO intake  COPD -Not in acute exacerbation   History of coronary artery disease Atrial fibrillation Abdominal aortic aneurysm without rupture -Home medications currently on hold due to marginal BP    -Coumadin on hold   Essential hypertension -Home medications currently on hold due to marginal BP   Severe protein calorie malnutrition -Dietitian consulted    DVT prophylaxis: SCD Code Status: DNR Family Communication: At bedside but wife does not speak [place], spoke with daughter over the phone  Disposition Plan: Daughter reports that they have decided to take patient home with home hospice. Consult CM today.    Consultants:   Palliative care  Procedures:   None   Antimicrobials:  Anti-infectives (From admission, onward)   Start     Dose/Rate Route Frequency Ordered Stop   05/29/17 1400  piperacillin-tazobactam (ZOSYN) IVPB 3.375 g  Status:  Discontinued     3.375 g 12.5 mL/hr over 240 Minutes Intravenous Every 8 hours 05/29/17 0830 06/02/17 1144   05/29/17 1000  vancomycin (VANCOCIN) 500 mg in sodium chloride 0.9 % 100 mL IVPB     500 mg 100 mL/hr over 60 Minutes Intravenous Every 24 hours 05/29/17 0830 06/02/17 2359   05/28/17 0600  vancomycin (VANCOCIN) 500 mg in sodium chloride 0.9 % 100 mL IVPB  Status:  Discontinued     500 mg 100 mL/hr over 60 Minutes Intravenous Every 48 hours 05/26/17 0805 05/29/17 0830   05/26/17 1400  piperacillin-tazobactam (ZOSYN) IVPB 2.25 g  Status:  Discontinued     2.25 g 100 mL/hr over 30 Minutes Intravenous Every 8 hours 05/26/17 0805 05/29/17 0830   05/26/17 1100  fluconazole (DIFLUCAN) IVPB 100 mg  Status:  Discontinued     100 mg 50 mL/hr over 60 Minutes Intravenous Every 24 hours 05/26/17 1026 05/26/17 1101   05/26/17  1000  fluconazole (DIFLUCAN) tablet 100 mg  Status:  Discontinued     100 mg Oral Daily 05/26/17 0731 05/26/17 1026   05/26/17 0500  piperacillin-tazobactam (ZOSYN) IVPB 3.375 g     3.375 g 100 mL/hr over 30 Minutes Intravenous  Once 05/26/17 0455 05/26/17 0553   05/26/17 0500  vancomycin (VANCOCIN) IVPB 1000 mg/200 mL premix     1,000 mg 200 mL/hr over 60 Minutes Intravenous  Once 05/26/17 0455 05/26/17 0807        Subjective: Patient minimally responsive, eyes flutter with exam but not alert   Objective: Vitals:   06/05/17 1918 06/06/17 0020 06/06/17 0020 06/06/17 0843  BP:   115/84 107/70  Pulse: (!) 101  (!) 125 (!) 116  Resp: 19  20 (!) 24  Temp:  97.9 F (36.6 C) 97.9 F (36.6 C) (!) 97.3 F (36.3 C)  TempSrc:  Axillary Oral Oral  SpO2: 100%  93% 100%  Weight:      Height:        Intake/Output Summary (Last 24 hours) at 06/06/2017 1134 Last data filed at 06/05/2017 2320 Gross per 24 hour  Intake -  Output 200 ml  Net -200 ml   Filed Weights   05/30/17 0324  Weight: 61.2 kg (134 lb 14.7 oz)    Examination: General exam: Appears calm and comfortable  Respiratory system: Respiratory effort normal.  Cardiovascular system: S1 & S2 heard, tachycardic rate. No pedal edema. Gastrointestinal system: Abdomen is nondistended, soft and nontender.  Extremities: +Bilateral UE with edema  Skin: No rashes, lesions or ulcers on exposed skin    Data Reviewed: I have personally reviewed following labs and imaging studies  CBC: Recent Labs  Lab 05/31/17 0812 06/01/17 0214 06/02/17 0233 06/03/17 0210 06/04/17 0405  WBC 19.1* 17.7* 14.3* 11.2* 11.9*  HGB 10.1* 9.6* 9.4* 9.2* 10.1*  HCT 31.6* 28.0* 29.3* 28.9* 32.0*  MCV 93.2 90.6 91.8 92.3 95.0  PLT 173 159 151 139* 171   Basic Metabolic Panel: Recent Labs  Lab 05/31/17 0806 06/01/17 0214 06/02/17 0233 06/03/17 0210 06/04/17 0405  NA 139 141 142 147* 148*  K 4.3 3.4* 3.2* 3.9 4.0  CL 115* 113* 115* 118* 120*  CO2 15* 20* 21* 21* 22  GLUCOSE 130* 108* 98 103* 101*  BUN 26* 27* 28* 26* 26*  CREATININE 1.73* 1.85* 1.79* 1.68* 1.61*  CALCIUM 7.8* 7.9* 7.8* 7.8* 8.1*  MG 1.8 1.5* 1.3* 1.4* 1.5*   GFR: Estimated Creatinine Clearance: 25.5 mL/min (A) (by C-G formula based on SCr of 1.61 mg/dL (H)). Liver Function Tests: No results for input(s): AST, ALT, ALKPHOS, BILITOT, PROT, ALBUMIN in the last 168 hours. No  results for input(s): LIPASE, AMYLASE in the last 168 hours. No results for input(s): AMMONIA in the last 168 hours. Coagulation Profile: Recent Labs  Lab 05/31/17 0806 06/01/17 0214 06/03/17 0210 06/04/17 0405  INR 1.80 1.63 1.75 1.85   Cardiac Enzymes: No results for input(s): CKTOTAL, CKMB, CKMBINDEX, TROPONINI in the last 168 hours. BNP (last 3 results) No results for input(s): PROBNP in the last 8760 hours. HbA1C: No results for input(s): HGBA1C in the last 72 hours. CBG: Recent Labs  Lab 05/31/17 1121  GLUCAP 117*   Lipid Profile: No results for input(s): CHOL, HDL, LDLCALC, TRIG, CHOLHDL, LDLDIRECT in the last 72 hours. Thyroid Function Tests: No results for input(s): TSH, T4TOTAL, FREET4, T3FREE, THYROIDAB in the last 72 hours. Anemia Panel: No results for input(s): VITAMINB12, FOLATE, FERRITIN, TIBC, IRON,  RETICCTPCT in the last 72 hours. Sepsis Labs: Recent Labs  Lab 05/31/17 0806 06/02/17 0233  PROCALCITON 0.52 0.34    No results found for this or any previous visit (from the past 240 hour(s)).     Radiology Studies: No results found.    Scheduled Meds: . arformoterol  15 mcg Nebulization BID  . budesonide  0.5 mg Nebulization BID  . feeding supplement (ENSURE ENLIVE)  237 mL Oral BID BM  . ipratropium-albuterol  3 mL Nebulization BID  . mupirocin ointment   Nasal BID  . scopolamine  1 patch Transdermal Q72H   Continuous Infusions: . famotidine (PEPCID) IV Stopped (06/05/17 2227)     LOS: 11 days    Time spent: 30 minutes   Noralee Stain, DO [facility] [email or web address] Password [facility] 06/06/2017, 11:34 AM

## 2017-06-07 MED ORDER — MORPHINE SULFATE 20 MG/5ML PO SOLN: 2.5000 mg | ORAL | 0 refills | Status: AC | PRN

## 2017-06-07 MED ORDER — SCOPOLAMINE 1 MG/3DAYS TD PT72: 1.0000 | MEDICATED_PATCH | TRANSDERMAL | 0 refills | Status: AC

## 2017-06-07 MED ORDER — MORPHINE SULFATE (PF) 2 MG/ML IV SOLN
0.5000 mg | INTRAVENOUS | Status: DC | PRN
  Administered 2017-06-07: 0.5 mg via INTRAVENOUS
  Filled 2017-06-07: qty 1

## 2017-06-07 MED ORDER — MORPHINE SULFATE 20 MG/5ML PO SOLN: 2.5000 mg | ORAL | 0 refills | Status: DC | PRN

## 2017-06-07 MED ORDER — GLYCOPYRROLATE 1 MG PO TABS: 1.0000 mg | ORAL_TABLET | ORAL | 0 refills | Status: AC | PRN

## 2017-06-07 MED ORDER — GLYCOPYRROLATE 1 MG PO TABS: 1.0000 mg | ORAL_TABLET | ORAL | 0 refills | Status: DC | PRN

## 2017-06-07 MED ORDER — SCOPOLAMINE 1 MG/3DAYS TD PT72: 1.0000 | MEDICATED_PATCH | TRANSDERMAL | 0 refills | Status: DC

## 2017-06-07 MED ORDER — HALOPERIDOL LACTATE 2 MG/ML PO CONC: 0.6000 mg | ORAL | 0 refills | Status: DC | PRN

## 2017-06-07 MED ORDER — LORAZEPAM 1 MG PO TABS: 1.0000 mg | ORAL_TABLET | ORAL | 0 refills | Status: DC | PRN

## 2017-06-07 MED ORDER — HALOPERIDOL LACTATE 2 MG/ML PO CONC: 0.6000 mg | ORAL | 0 refills | Status: AC | PRN

## 2017-06-07 MED ORDER — LORAZEPAM 1 MG PO TABS: 1.0000 mg | ORAL_TABLET | ORAL | 0 refills | Status: AC | PRN

## 2017-06-07 NOTE — Progress Notes (Signed)
Called for transport for patient to home. No time given for arrival.

## 2017-06-07 NOTE — Progress Notes (Signed)
[facility]) Hospital Liaison RN Visit  Notified by Fabio Neighbors, [facility] of patient/family request for [facility] services at home after discharge. Chart and patient information under review by [facility] physician. Hospice eligibility pending at this time  Spoke with pt's daughter, Yvone Neu at bedside to initiate education related to hospice philosophy, services and team approach to care. [place]e verbalized understanding of information given. Per discussion plan is for discharge to home by PTAR on Sunday 06/07/17.  DME needs have been discussed, patient currently has a WC, BSC and home fill O2 unit in the home. Daughter requests Hospital bed with 1/2 rails, OBT, suction and O2 at 2 lpm continuous to be delivered to the home. [facility] has been notified and asked to deliver equipment to the home ASAP to expedite discharge. Home address has been verified and is correct in the chart. Boonmee [phone]) is the person to contact to arrange time of delivery.  Please send signed and completed DNR form home with patient/family. Patient will need prescriptions for discharge comfort medications.  [facility] aware of the above. Completed discharge summary will need to be faxed to [facility] at [phone] when final.   Please notify [facility] when patient is ready to leave the unit at discharge (call [phone]).   [facility] contact numbers given to [facility] during this visit.   Above information shared with Henderson Newcomer, RNCM.  Please call with any hospice related questions or concerns.  Thank you for this referral.  Haynes Bast, RN, BSN [facility] Liaison [phone]  [facility] Liaisons are on AMION.

## 2017-06-07 NOTE — Discharge Summary (Signed)
Physician Discharge Summary  [name]:[id number] DOB: 08-24-1926 DOA: 05/26/2017  PCP: [name], MD  Admit date: 05/26/2017 Discharge date: 06/07/2017  Admitted From: Home Disposition:  Home with home hospice   Discharge Condition: Terminal, expected death < 2 weeks  CODE STATUS: DNR  Diet recommendation: Comfort feeding DME: Hospital bed    Brief/Interim Summary: [name] is a 84 year old with a history of diabetes mellitus type 2, essential hypertension, coronary artery disease, CHF, multiple CVAs who was brought to the hospital with complains of chest pain. Patient was found to be hypotensive initially despite of getting IV fluids but family did not want any aggressive measures besides IV fluids and antibiotics. Chest x-ray was suggestive of possible community-acquired pneumonia therefore started on IV antibiotics.  He has chronic long-term poor prognosis given his comorbidities, advanced age, aspiration risk.  Palliative care was consulted.  Family decided to take patient home with home hospice services.   Discharge Diagnoses:  Principal Problem:   Acute respiratory failure (HCC) Active Problems:   Acute renal failure superimposed on stage 3 chronic kidney disease (HCC)   AAA (abdominal aortic aneurysm) without rupture (HCC)   Coronary artery disease   Chest pain at rest   Hyponatremia   Carotid artery disease (HCC)   CAP (community acquired pneumonia)   Sepsis (HCC)   COPD, group B, by GOLD 2017 classification (HCC)   Aphasia   Benign essential HTN   Chronic atrial fibrillation (HCC)   Chronic diastolic congestive heart failure (HCC)   Cystitis   Hyperkalemia   Goals of care, counseling/discussion   Palliative care by specialist  Septic shock secondary to multifocal community-acquired pneumonia as well as aspiration pneumonia Acute hypoxic respiratory failure CKD stage III Hypernatremia COPD History of coronary artery disease Atrial  fibrillation Abdominal aortic aneurysm without rupture Essential hypertension Severe protein calorie malnutrition   Discharge Instructions   Allergies as of 06/07/2017      Reactions   Levofloxacin Anaphylaxis, Other (See Comments)   Patient told his daughter that it made him "feel worse than he already did" (overall) Headache also (has refused to take anymore)   Eliquis [apixaban] Itching, Swelling   Phenazopyridine Hcl Other (See Comments)   Unknown reaction   Septra [sulfamethoxazole-trimethoprim] Nausea And Vomiting, Other (See Comments)   GI Upset   Latex Rash   Tape Rash      Medication List    STOP taking these medications   acetaminophen 650 MG CR tablet Commonly known as:  TYLENOL   allopurinol 300 MG tablet Commonly known as:  ZYLOPRIM   arformoterol 15 MCG/2ML Nebu Commonly known as:  BROVANA   atorvastatin 40 MG tablet Commonly known as:  LIPITOR   budesonide 0.5 MG/2ML nebulizer solution Commonly known as:  PULMICORT   carvedilol 6.25 MG tablet Commonly known as:  COREG   esomeprazole 40 MG capsule Commonly known as:  NEXIUM   fluconazole 100 MG tablet Commonly known as:  DIFLUCAN   nitroGLYCERIN 0.4 MG SL tablet Commonly known as:  NITROSTAT   tamsulosin 0.4 MG Caps capsule Commonly known as:  FLOMAX   warfarin 2 MG tablet Commonly known as:  COUMADIN     TAKE these medications   glycopyrrolate 1 MG tablet Commonly known as:  ROBINUL Take 1 tablet (1 mg total) by mouth every 4 (four) hours as needed (excessive secretions).   haloperidol 2 MG/ML solution Commonly known as:  HALDOL Place 0.3 mLs (0.6 mg total) under the tongue every 4 (  four) hours as needed for agitation (or delirium).   LORazepam 1 MG tablet Commonly known as:  ATIVAN Take 1 tablet (1 mg total) by mouth every 4 (four) hours as needed for anxiety.   morphine 20 MG/5ML solution Take 0.6 mLs (2.4 mg total) by mouth every 2 (two) hours as needed for pain (dyspnea).    scopolamine 1 MG/3DAYS Commonly known as:  TRANSDERM-SCOP Place 1 patch (1.5 mg total) onto the skin every 3 (three) days. Start taking on:  06/10/2017            Durable Medical Equipment  (From admission, onward)        Start     Ordered   06/06/17 1137  For home use only DME Hospital bed  Once    Question Answer Comment  Patient has (list medical condition): Septic shock secondary to multifocal community-acquired pneumonia as well as aspiration pneumonia, transition to home hospice and end of life   The above medical condition requires: Patient requires the ability to reposition frequently   Head must be elevated greater than: 30 degrees   Bed type Semi-electric   Hoyer Lift Yes      06/06/17 1136      Allergies  Allergen Reactions  . Levofloxacin Anaphylaxis and Other (See Comments)    Patient told his daughter that it made him "feel worse than he already did" (overall) Headache also (has refused to take anymore)  . Eliquis [Apixaban] Itching and Swelling  . Phenazopyridine Hcl Other (See Comments)    Unknown reaction  . Septra [Sulfamethoxazole-Trimethoprim] Nausea And Vomiting and Other (See Comments)    GI Upset  . Latex Rash  . Tape Rash    Consultations:  Palliative care   Procedures/Studies: Dg Chest Port 1 View  Result Date: 05/30/2017 CLINICAL DATA:  Dyspnea,shob with chest pain,,used 2 pillows to get patient straight,could not follow directions EXAM: PORTABLE CHEST - 1 VIEW COMPARISON:  05/26/2017 FINDINGS: Progressive bibasilar airspace disease with more dense consolidation at the left lung base. Worsening interstitial infiltrates or edema predominately perihilar. Probable pleural effusions left greater than right. Heart size upper limits normal for technique. Aortic Atherosclerosis (ICD10-170.0) No pneumothorax. Visualized bones unremarkable. IMPRESSION: 1. Worsening asymmetric infiltrates or edema with small effusions. Electronically Signed   By: Corlis Leak M.D.   On: 05/30/2017 09:38   Dg Chest Portable 1 View  Result Date: 05/26/2017 CLINICAL DATA:  Acute onset of generalized chest pain. EXAM: PORTABLE CHEST 1 VIEW COMPARISON:  Chest radiograph performed 12/06/2016, and CT of the chest performed 03/17/2017 FINDINGS: The lungs are well-aerated. Mild left-sided atelectasis noted. There is no evidence of pleural effusion or pneumothorax. The cardiomediastinal silhouette is borderline enlarged. No acute osseous abnormalities are seen. IMPRESSION: Mild left-sided atelectasis.  Borderline cardiomegaly. Electronically Signed   By: Roanna Raider M.D.   On: 05/26/2017 05:32   Ct Angio Chest/abd/pel For Dissection W And/or Wo Contrast  Result Date: 05/26/2017 CLINICAL DATA:  Acute onset of cough and hypertension. Known abdominal aortic aneurysm. EXAM: CT ANGIOGRAPHY CHEST, ABDOMEN AND PELVIS TECHNIQUE: Multidetector CT imaging through the chest, abdomen and pelvis was performed using the standard protocol during bolus administration of intravenous contrast. Multiplanar reconstructed images and MIPs were obtained and reviewed to evaluate the vascular anatomy. CONTRAST:  70mL ISOVUE-370 IOPAMIDOL (ISOVUE-370) INJECTION 76% COMPARISON:  CT of the abdomen and pelvis performed 07/31/2015, and CT of the chest performed 03/17/2017 FINDINGS: CTA CHEST FINDINGS Cardiovascular: There is no evidence of aortic dissection. There  is no evidence of aneurysmal dilatation. The ascending thoracic aorta is borderline normal in caliber. Scattered calcification is noted along the aortic arch, and mild mural thrombus is seen along the aortic arch and descending thoracic aorta, without significant luminal narrowing. The great vessels are grossly unremarkable in appearance. Mediastinum/Nodes: There is borderline prominence of subcarinal nodes, measuring up to 1.1 cm in short axis. No additional mediastinal lymphadenopathy is seen. No pericardial effusion is identified. The thyroid  gland is unremarkable. No axillary lymphadenopathy is seen. Lungs/Pleura: Patchy bilateral airspace opacities are noted, most prominent at the left lung base, though seen throughout all lobes, concerning for multifocal pneumonia. Minimal peripheral fibrotic change is noted at the lung bases. No pleural effusion or pneumothorax is seen. No dominant mass is identified. Musculoskeletal: No acute osseous abnormalities are identified. The visualized musculature is unremarkable in appearance. Review of the MIP images confirms the above findings. CTA ABDOMEN AND PELVIS FINDINGS VASCULAR Aorta: There is aneurysmal dilatation of the infrarenal abdominal aorta to 4.8 cm in AP and transverse dimensions, essentially stable from February. Underlying mural thrombus is noted, without evidence of aortic dissection. Celiac: The celiac trunk remains patent. SMA: The superior mesenteric artery remains patent. Renals: The renal arteries appear patent bilaterally, though there appears to be luminal narrowing along the proximal renal arteries bilaterally. IMA: The inferior mesenteric artery appears grossly patent, arising at the superior aspect of the aneurysm. 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 There is worsening severe chronic narrowing of the lumen of the right internal iliac artery aneurysm due to mural thrombus. Veins: Visualized venous structures are grossly unremarkable in appearance. Review of the MIP images confirms the above findings. NON-VASCULAR Hepatobiliary: The liver is unremarkable in appearance. The gallbladder is unremarkable in appearance. The common bile duct remains normal in caliber. Pancreas: The pancreas is within normal limits. Spleen: The spleen is unremarkable in appearance. Adrenals/Urinary Tract: The adrenal glands are unremarkable in appearance. Mild bilateral renal atrophy is noted. There is no evidence of hydronephrosis. No renal or  ureteral stones are identified. No perinephric stranding is seen. Stomach/Bowel: A small hiatal hernia is noted. The stomach is decompressed and otherwise unremarkable. The small bowel is grossly unremarkable in appearance. The appendix is normal in caliber, without evidence of appendicitis. The colon is grossly unremarkable in appearance. Lymphatic: The stomach is unremarkable in appearance. The small bowel is within normal limits. The appendix is normal in caliber, without evidence of appendicitis. The colon is unremarkable in appearance. Reproductive: The bladder demonstrates mild wall thickening and soft tissue inflammation, raising question for cystitis. The prostate remains normal in size. Other: No additional soft tissue abnormalities are seen. Musculoskeletal: No acute osseous abnormalities are identified. Chronic bilateral pars defects are seen at L5, without significant anterolisthesis. The visualized musculature is unremarkable in appearance. Review of the MIP images confirms the above findings. IMPRESSION: 1. No evidence of acute aortic dissection. 2. Patchy bilateral airspace opacities, most prominent at the left lung base, concerning for multifocal pneumonia. 3. Mild bladder wall thickening and soft tissue inflammation raises question for cystitis. Would correlate for associated symptoms. 4. Aneurysmal dilatation of the infrarenal abdominal aorta to 4.8 cm in AP and transverse dimensions, essentially stable from February. Underlying mural thrombus noted. 5. Increasing mural thrombus along the right internal iliac artery aneurysm, with severe chronic luminal narrowing. 6. 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 7. Borderline prominence of subcarinal  nodes, measuring up to 1.1 cm in short axis. 8. Small hiatal hernia. 9. Mild bilateral renal atrophy. 10. Chronic bilateral pars defects at L5, without significant anterolisthesis. Electronically Signed    By: Roanna Raider M.D.   On: 05/26/2017 06:52      Discharge Exam: Vitals:   06/07/17 0737 06/07/17 0744  BP:  105/70  Pulse:  (!) 105  Resp:  (!) 22  Temp:  98.9 F (37.2 C)  SpO2: 98% 99%    General: Pt is minimally responsive to physical stimuli  Cardiovascular: tachycardic, regular rhythm, S1/S2 +, no rubs, no gallops Respiratory: CTA bilaterally, no wheezing, no rhonchi Abdominal: Soft, NT, ND, bowel sounds + Extremities: no edema, no cyanosis    The results of significant diagnostics from this hospitalization (including imaging, microbiology, ancillary and laboratory) are listed below for reference.     Microbiology: No results found for this or any previous visit (from the past 240 hour(s)).   Labs: BNP (last 3 results) Recent Labs    05/26/17 0430 06/01/17 0214  BNP 208.6* 459.6*   Basic Metabolic Panel: Recent Labs  Lab 06/01/17 0214 06/02/17 0233 06/03/17 0210 06/04/17 0405  NA 141 142 147* 148*  K 3.4* 3.2* 3.9 4.0  CL 113* 115* 118* 120*  CO2 20* 21* 21* 22  GLUCOSE 108* 98 103* 101*  BUN 27* 28* 26* 26*  CREATININE 1.85* 1.79* 1.68* 1.61*  CALCIUM 7.9* 7.8* 7.8* 8.1*  MG 1.5* 1.3* 1.4* 1.5*   Liver Function Tests: No results for input(s): AST, ALT, ALKPHOS, BILITOT, PROT, ALBUMIN in the last 168 hours. No results for input(s): LIPASE, AMYLASE in the last 168 hours. No results for input(s): AMMONIA in the last 168 hours. CBC: Recent Labs  Lab 06/01/17 0214 06/02/17 0233 06/03/17 0210 06/04/17 0405  WBC 17.7* 14.3* 11.2* 11.9*  HGB 9.6* 9.4* 9.2* 10.1*  HCT 28.0* 29.3* 28.9* 32.0*  MCV 90.6 91.8 92.3 95.0  PLT 159 151 139* 171   Cardiac Enzymes: No results for input(s): CKTOTAL, CKMB, CKMBINDEX, TROPONINI in the last 168 hours. BNP: Invalid input(s): POCBNP CBG: No results for input(s): GLUCAP in the last 168 hours. D-Dimer No results for input(s): DDIMER in the last 72 hours. Hgb A1c No results for input(s): HGBA1C in the  last 72 hours. Lipid Profile No results for input(s): CHOL, HDL, LDLCALC, TRIG, CHOLHDL, LDLDIRECT in the last 72 hours. Thyroid function studies No results for input(s): TSH, T4TOTAL, T3FREE, THYROIDAB in the last 72 hours.  Invalid input(s): FREET3 Anemia work up No results for input(s): VITAMINB12, FOLATE, FERRITIN, TIBC, IRON, RETICCTPCT in the last 72 hours. Urinalysis    Component Value Date/Time   COLORURINE YELLOW 12/09/2016 2032   APPEARANCEUR CLEAR 12/09/2016 2032   APPEARANCEUR Clear 11/05/2016 1044   LABSPEC 1.013 12/09/2016 2032   PHURINE 7.0 12/09/2016 2032   GLUCOSEU 150 (A) 12/09/2016 2032   HGBUR NEGATIVE 12/09/2016 2032   BILIRUBINUR NEGATIVE 12/09/2016 2032   BILIRUBINUR Negative 11/05/2016 1044   KETONESUR NEGATIVE 12/09/2016 2032   PROTEINUR NEGATIVE 12/09/2016 2032   UROBILINOGEN 0.2 07/07/2014 0604   NITRITE NEGATIVE 12/09/2016 2032   LEUKOCYTESUR SMALL (A) 12/09/2016 2032   LEUKOCYTESUR Negative 11/05/2016 1044   Sepsis Labs Invalid input(s): PROCALCITONIN,  WBC,  LACTICIDVEN Microbiology No results found for this or any previous visit (from the past 240 hour(s)).   Patient was seen and examined on the day of discharge and was found to be in stable condition. Time coordinating discharge: 40 minutes  including assessment and coordination of care, as well as examination of the patient.   SIGNED:  Noralee Stain, DO [facility] Pager [phone]  If 7PM-7AM, please contact night-coverage [email or web address] Password [facility] 06/07/2017, 11:51 AM

## 2017-06-07 NOTE — Progress Notes (Addendum)
Received phone call from Dr Alvino Chapel that patient's family would like him to d/c home with hospice today if possible. CM called patients daughter, Yvone Neu, and provided choice of hospice agencies. She selected [facility]. CM called and left message for [facility] with [facility]. CM following.   AddendumFrench Ana called back and will see the patient on the unit in about an hour. Boonmee to be here at that time.   Addendum: 12:40 CM spoke to [facility] and the equipment is to delivered between 2-6 pm. Boonmee to call when equipment arrives. CM informed her that medications sent to [facility] pharmacy and one of the family will need to pick up prior to patient arriving home. She voiced understanding.   Addendum: 0459: CM spoke to [facility] and the equipment is still not at the residence. CM provided the bedside RN with the number for after hours emergency for transport home for the patient. CM also provided her the number for the [facility] RN, Renae Fickle, so she can let him know when [facility] arrives to take the patient home. CM verified the home address with Boon[place].

## 2017-06-10 DEATH — deceased

## 2017-07-17 NOTE — Progress Notes (Signed)
This encounter was created in error - please disregard.

## 2017-11-05 ENCOUNTER — Other Ambulatory Visit: Payer: Self-pay

## 2017-11-05 DIAGNOSIS — I10 Essential (primary) hypertension: Secondary | ICD-10-CM

## 2017-11-05 DIAGNOSIS — I779 Disorder of arteries and arterioles, unspecified: Secondary | ICD-10-CM

## 2017-11-05 DIAGNOSIS — I2510 Atherosclerotic heart disease of native coronary artery without angina pectoris: Secondary | ICD-10-CM

## 2017-11-05 DIAGNOSIS — I739 Peripheral vascular disease, unspecified: Secondary | ICD-10-CM

## 2018-03-22 IMAGING — RF DG SWALLOWING FUNCTION - NRPT [facility]
1 series · 18 of 24 positions shown · non-contrast
Comparison: none

[Series 1: run · 31 acquisitions, 18 frames shown]
[im 1/31]
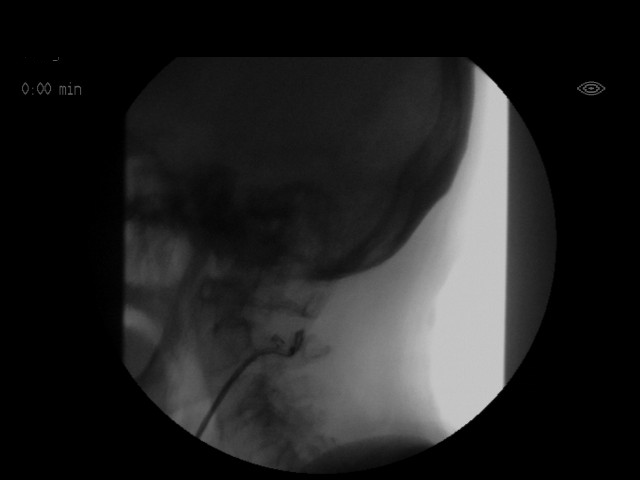
[im 3/31]
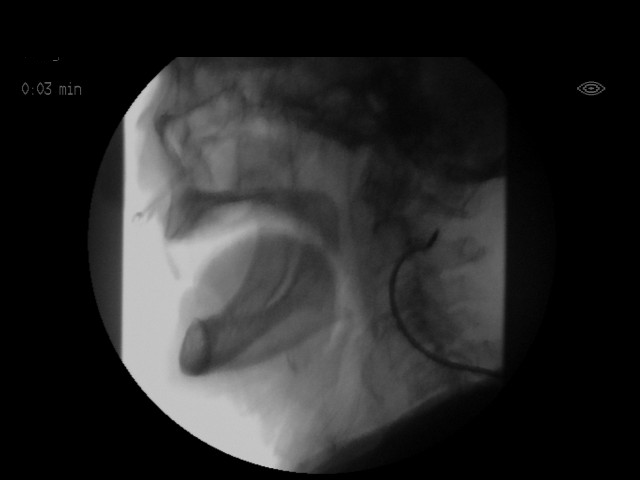
[im 4/31]
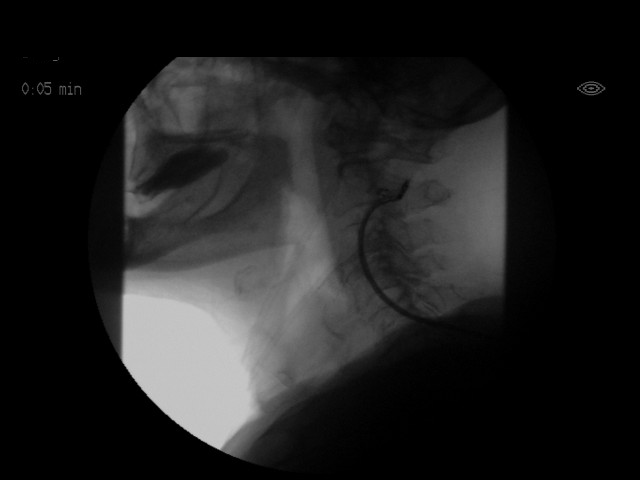
[im 6/31]
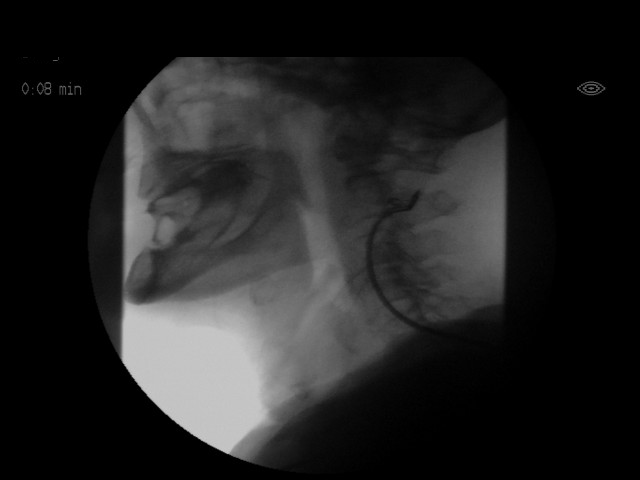
[im 8/31]
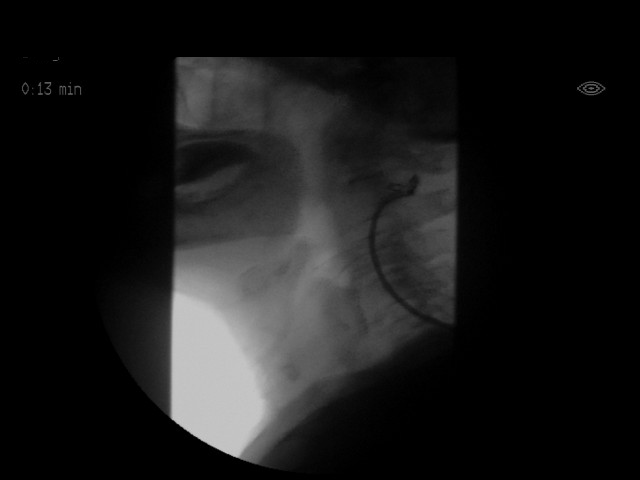
[im 10/31]
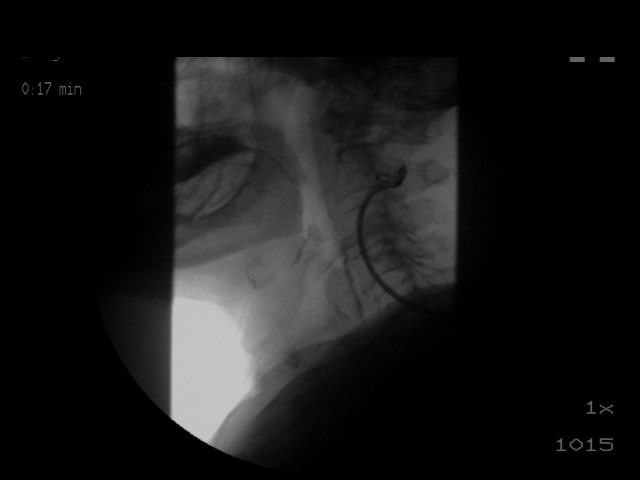
[im 11/31]
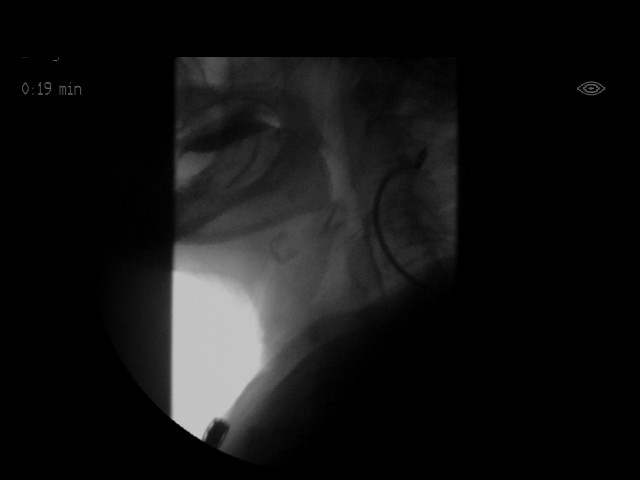
[im 14/31]
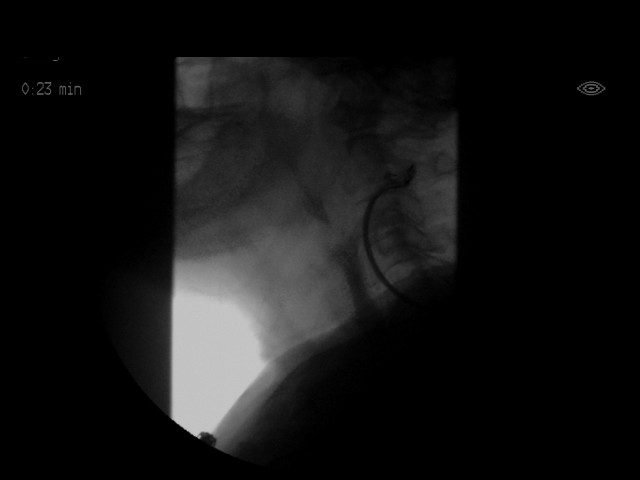
[im 15/31]
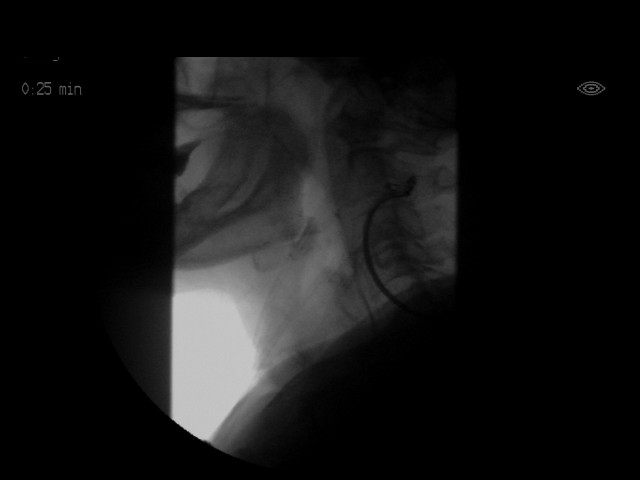
[im 16/31]
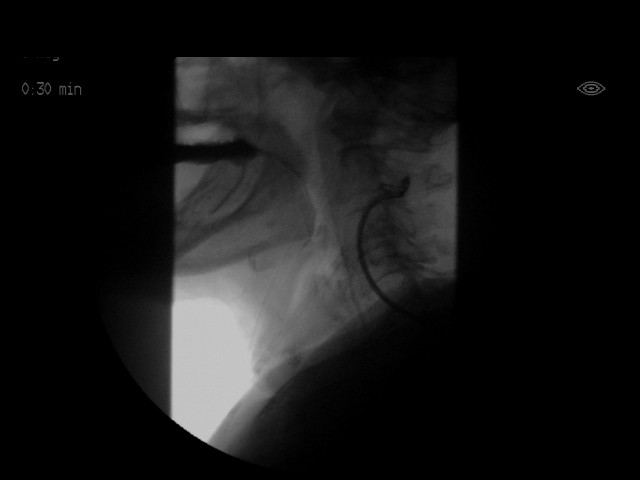
[im 19/31]
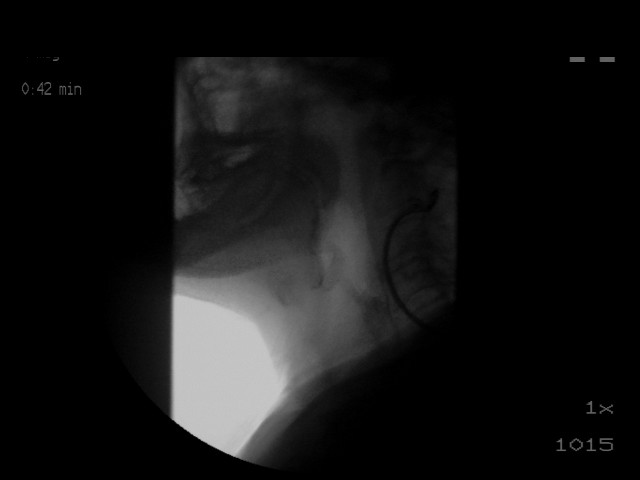
[im 20/31]
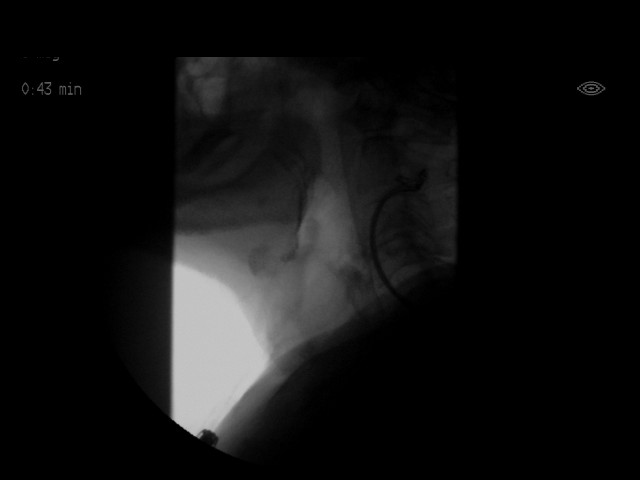
[im 21/31]
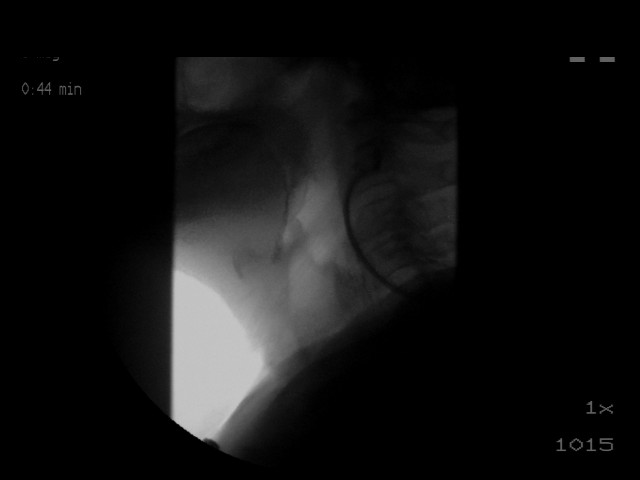
[im 24/31]
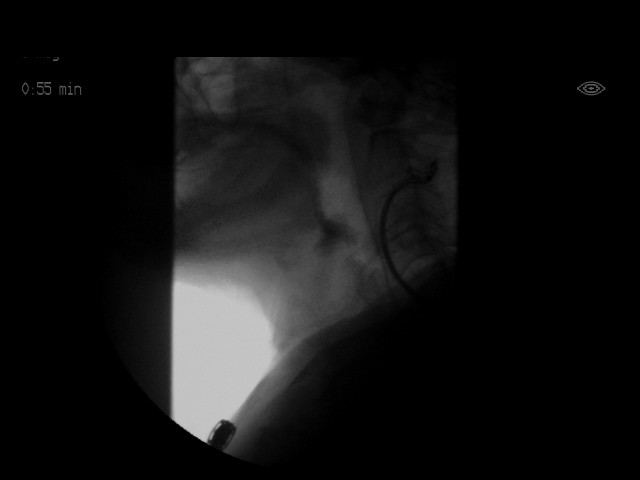
[im 25/31]
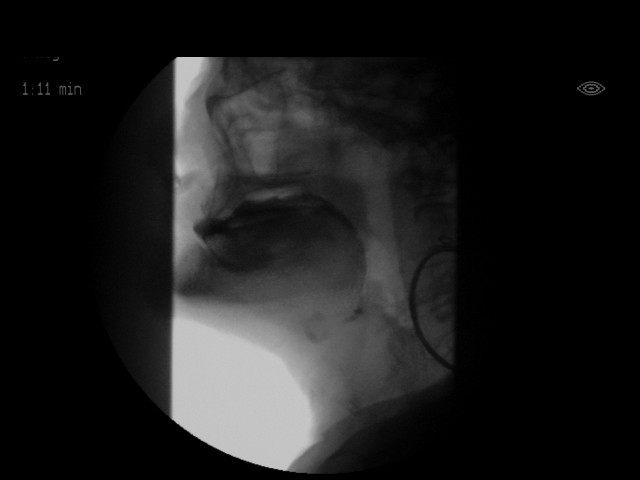
[im 27/31]
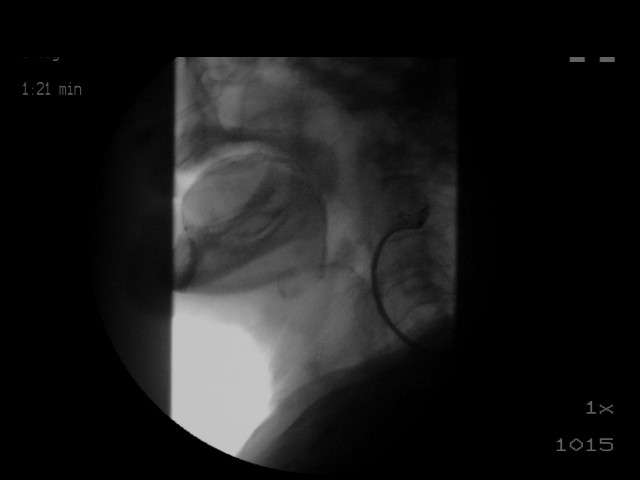
[im 29/31]
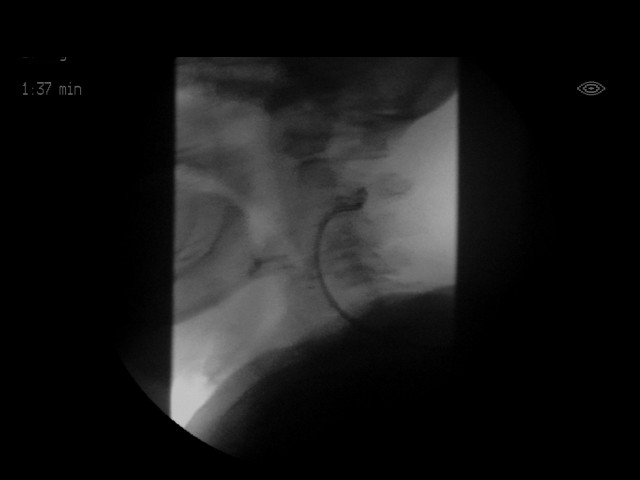
[im 31/31]
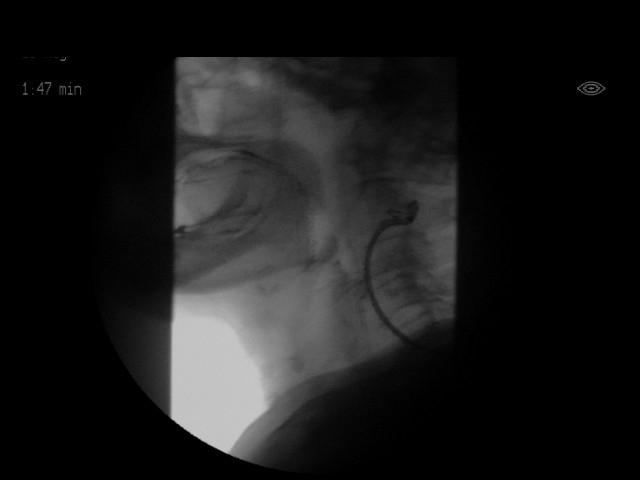

[18 of 24 positions shown; findings below may reference images not displayed]

FLUOROSCOPY FOR SWALLOWING FUNCTION STUDY:
Fluoroscopy was provided for swallowing function study, which was administered by a speech pathologist.  Final results and recommendations from this study are contained within the speech pathology report.

## 2018-03-23 IMAGING — DX DG CHEST 1V PORT
1 series · 1 of 1 positions shown · non-contrast
Comparison: 11/30/2016 .

CLINICAL DATA: Healthcare associated pneumonia.

EXAM:
PORTABLE CHEST 1 VIEW

[chest ap]
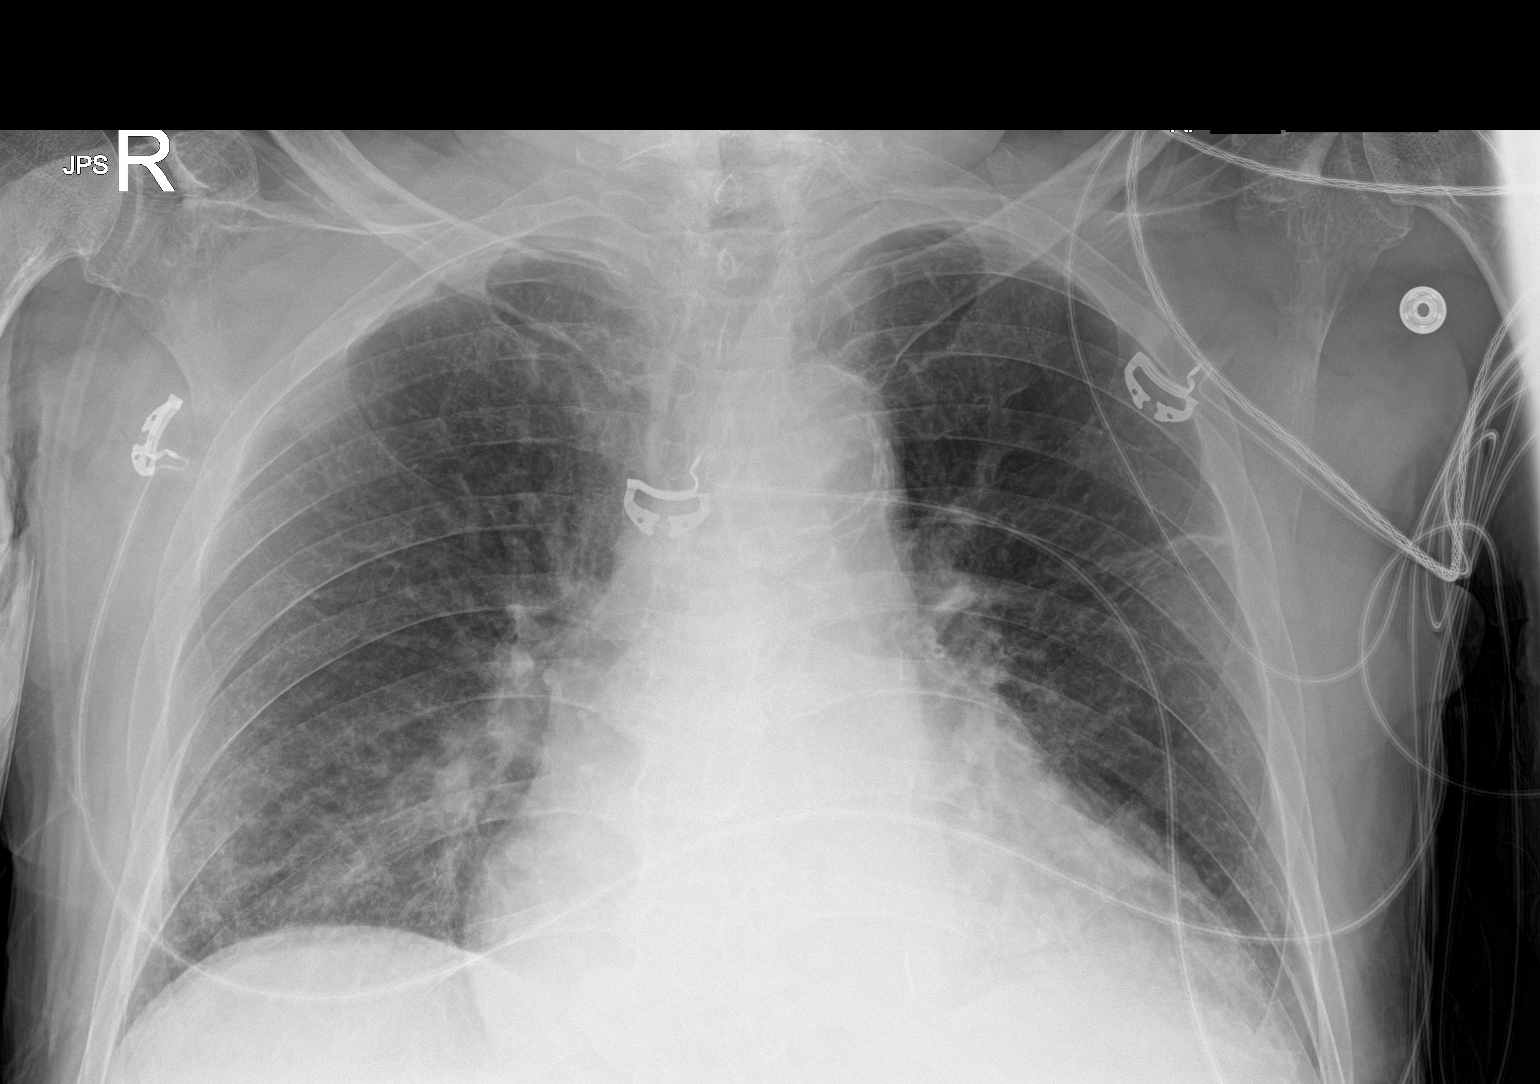

[1 of 1 positions shown; findings below may reference images not displayed]

FINDINGS: Cardiomegaly with pulmonary vascular prominence and bilateral
interstitial prominence. Findings suggest CHF. Basilar atelectasis.
Left hemidiaphragm incompletely imaged. No prominent pleural
effusion noted. No pneumothorax
IMPRESSION: 1. Cardiomegaly with bilateral from interstitial prominence
suggesting CHF.

2. Low lung volumes with bibasilar atelectasis and
infiltrates/edema. No interim change .

## 2018-03-27 IMAGING — CR DG CHEST 1V PORT
1 series · 1 of 1 positions shown · non-contrast
Comparison: Chest radiograph December 02, 2016

CLINICAL DATA: Respiratory distress.

EXAM:
PORTABLE CHEST 1 VIEW

[AP]
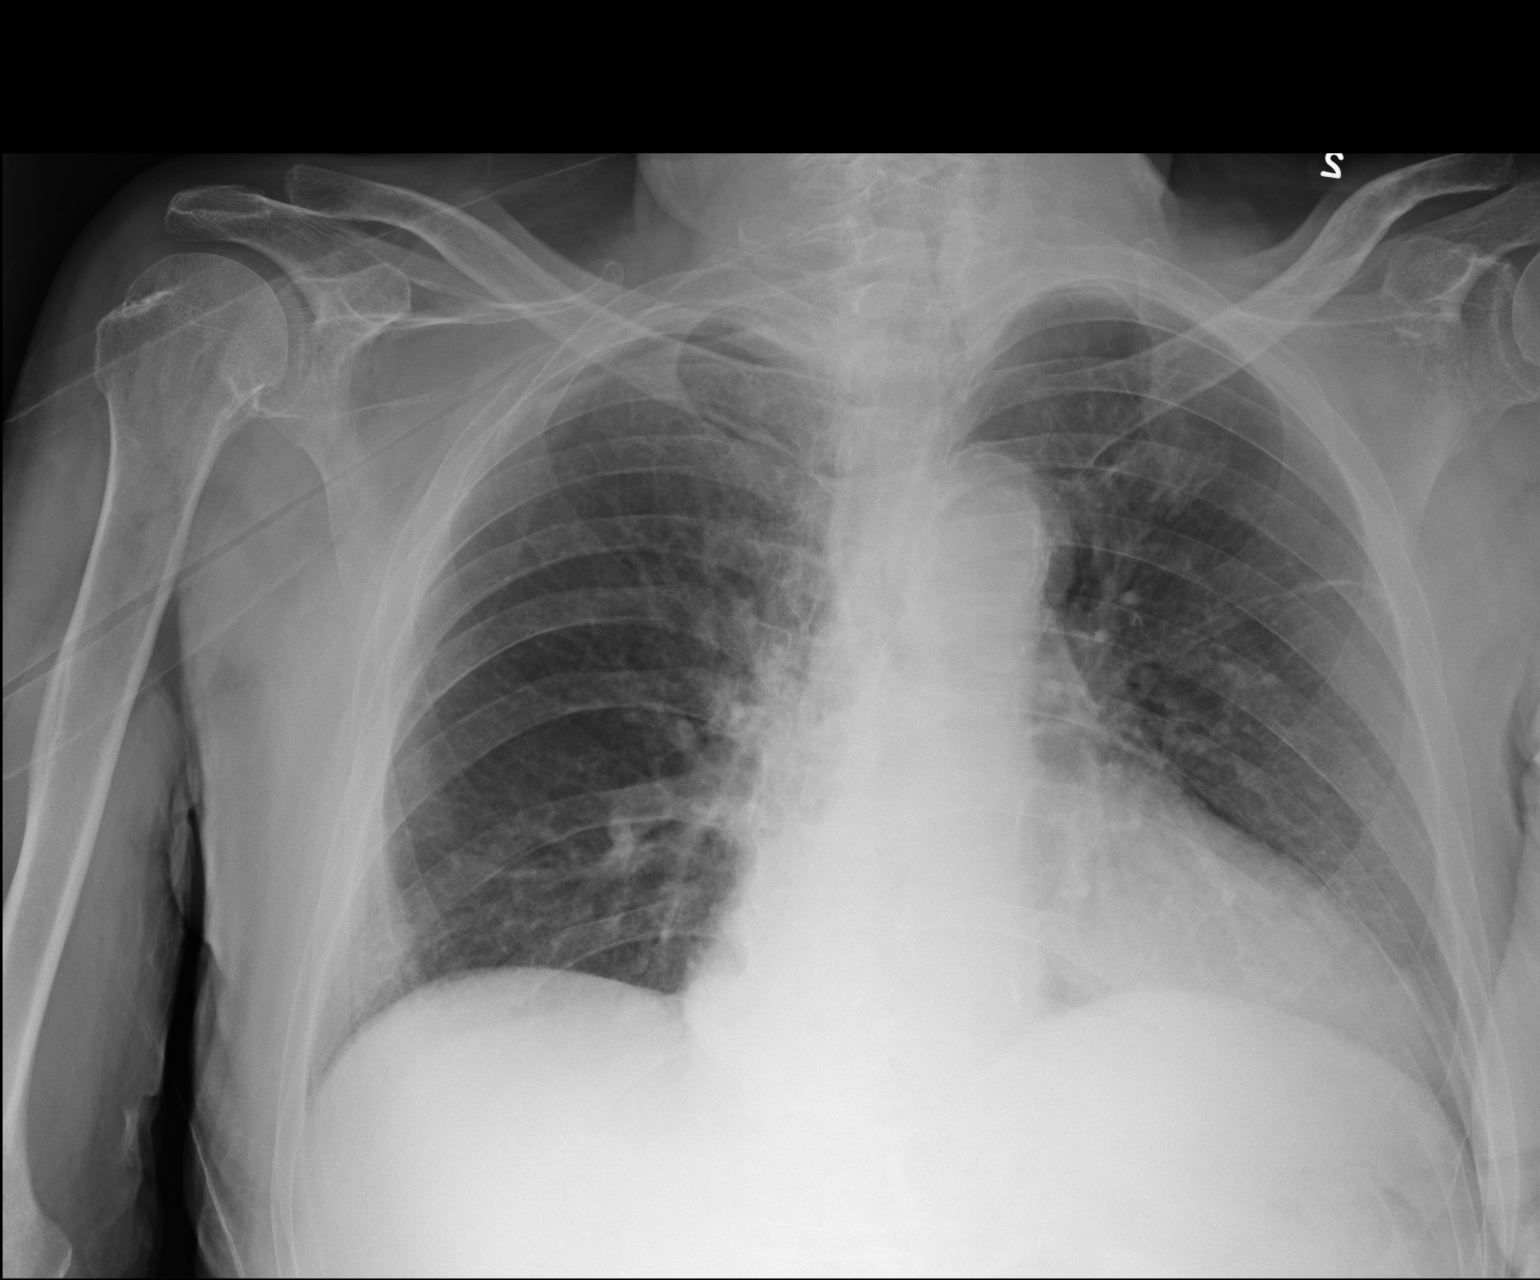

[1 of 1 positions shown; findings below may reference images not displayed]

FINDINGS: Stable cardiomegaly. Calcified aortic knob. LEFT upper lung zone
scarring. Decreased pulmonary edema with faint RIGHT lung base
airspace opacity. Pulmonary vascular congestion. No pleural
effusion. No pneumothorax. Osteopenia. Vascular stent LEFT neck.
IMPRESSION: Stable cardiomegaly, decreased pulmonary edema. RIGHT RIGHT lung
base atelectasis, less likely early pneumonia.

Aortic Atherosclerosis (BEN31-GD0.0).

## 2018-04-28 ENCOUNTER — Encounter (HOSPITAL_COMMUNITY): Payer: Medicare Other

## 2018-09-14 IMAGING — CT CT ANGIO CHEST-ABD-PELV FOR DISSECTION W/ AND WO/W CM
2 of 7 series · 12 of 46 positions shown, 14 images · IV contrast (iopamidol)
Comparison: CT of the abdomen and pelvis performed 07/31/2015, and
CT of the chest performed 03/17/2017

CLINICAL DATA: Acute onset of cough and hypertension. Known
abdominal aortic aneurysm.

EXAM:
CT ANGIOGRAPHY CHEST, ABDOMEN AND PELVIS
TECHNIQUE: Multidetector CT imaging through the chest, abdomen and pelvis was
performed using the standard protocol during bolus administration of
intravenous contrast. Multiplanar reconstructed images and MIPs were
obtained and reviewed to evaluate the vascular anatomy.
CONTRAST:  70mL [id number] IOPAMIDOL ([id number]) INJECTION 76%

[Series 6: arterial · axial · arterial · 0.68mm/px · z∈[+707,+1229]mm · 9 of 295 slices shown, 11 images]
[im 17/295  soft-tissue]
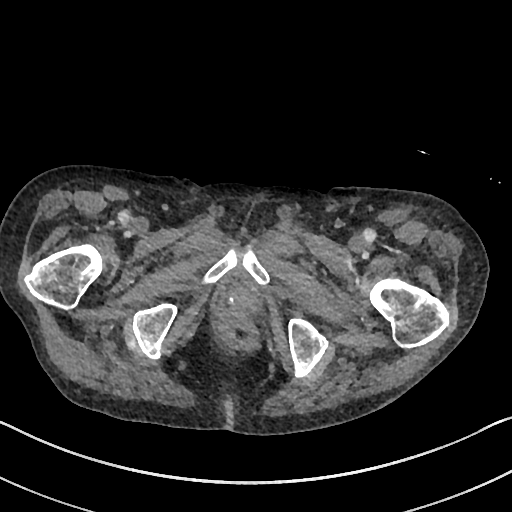
[im 17/295  bone]
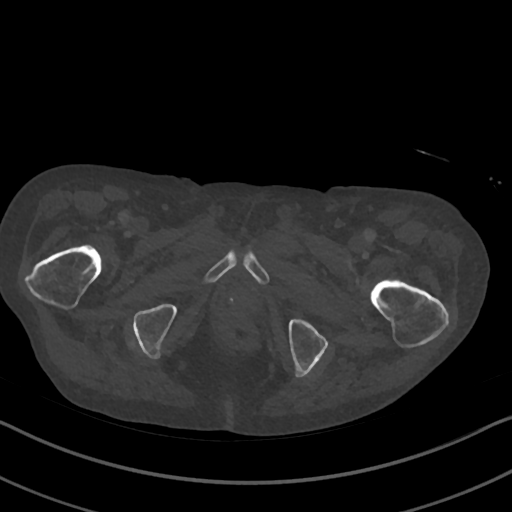
[im 50/295  soft-tissue]
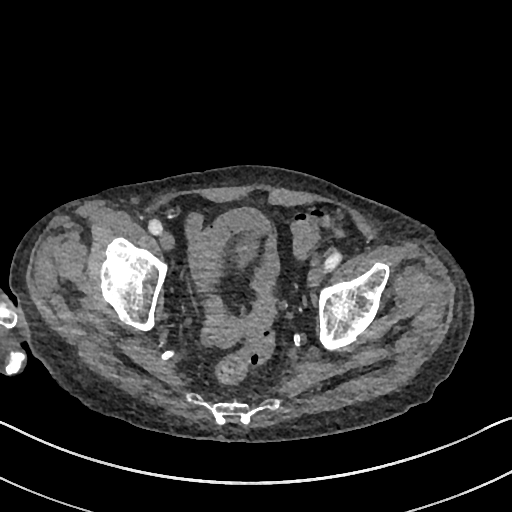
[im 82/295  soft-tissue]
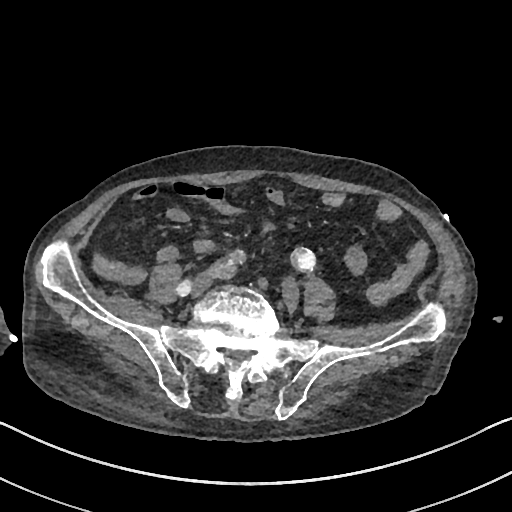
[im 115/295  soft-tissue]
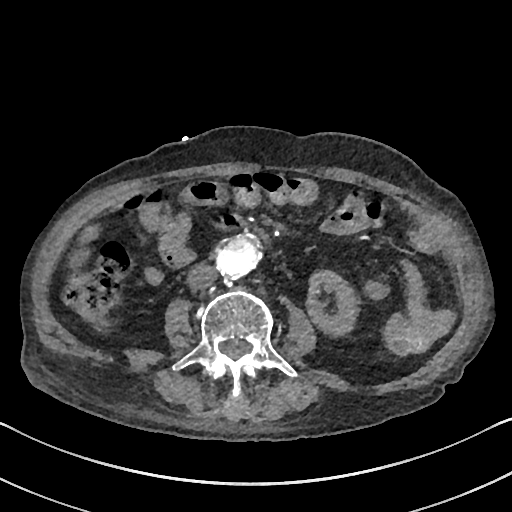
[im 148/295  soft-tissue]
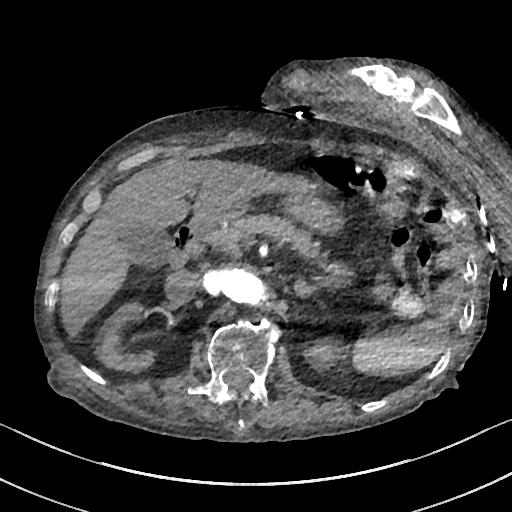
[im 180/295  soft-tissue]
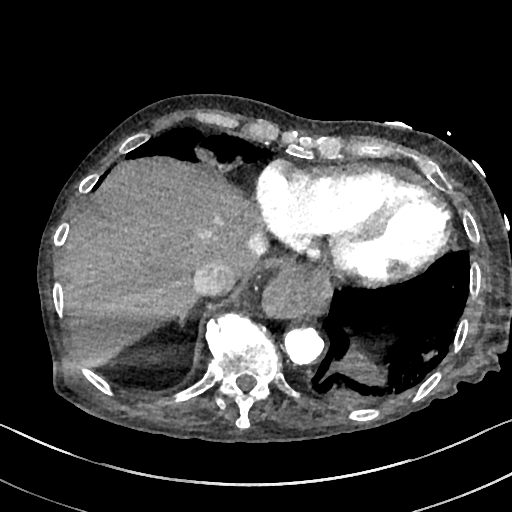
[im 213/295  soft-tissue]
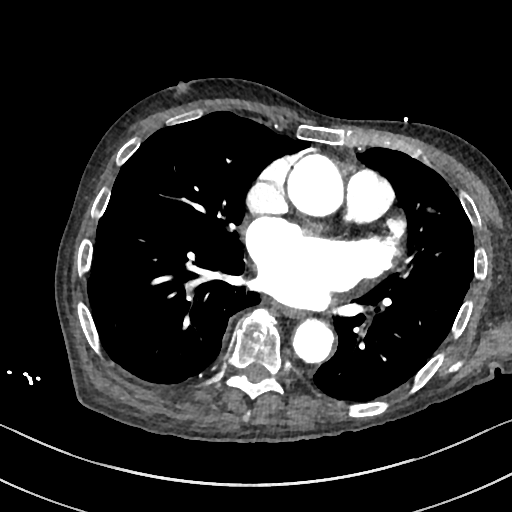
[im 245/295  soft-tissue]
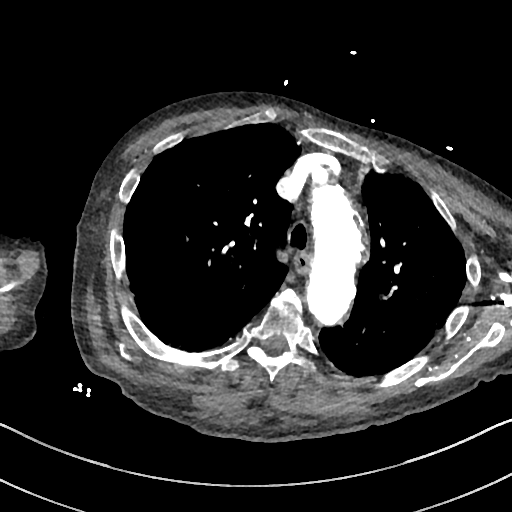
[im 278/295  soft-tissue]
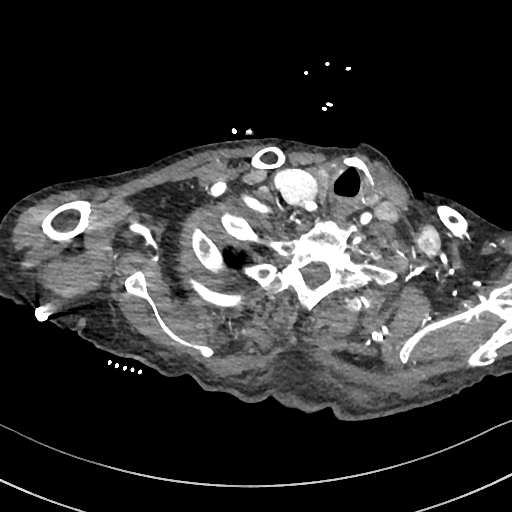
[im 278/295  bone]
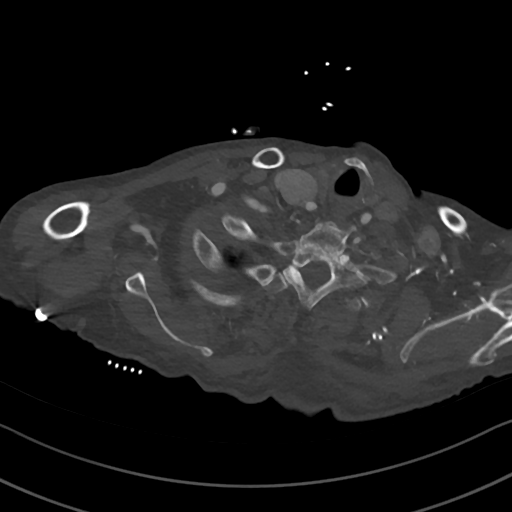

[Series 9: cor · coronal · 0.67mm/px · 3 of 131 slices shown]
[im 33/131  soft-tissue]
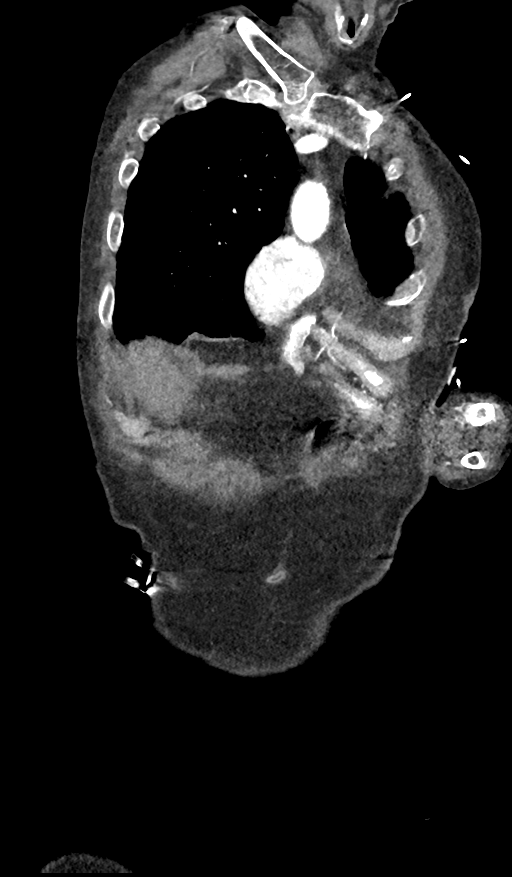
[im 66/131  soft-tissue]
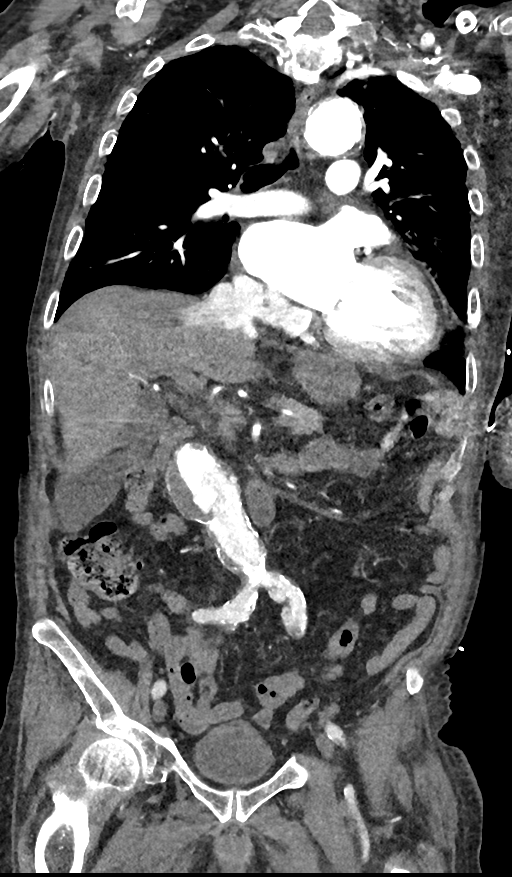
[im 98/131  soft-tissue]
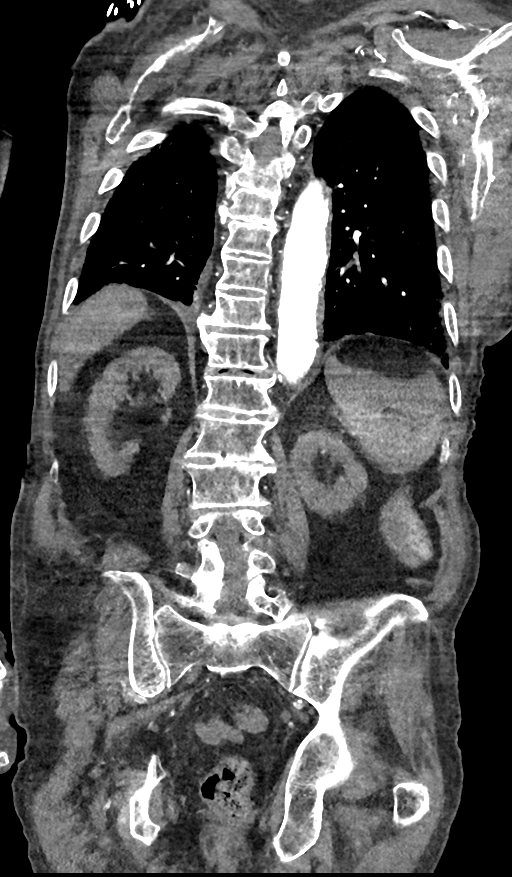

[12 of 46 positions shown; findings below may reference images not displayed]

FINDINGS: CTA CHEST FINDINGS

Cardiovascular: There is no evidence of aortic dissection. There is
no evidence of aneurysmal dilatation. The ascending thoracic aorta
is borderline normal in caliber. Scattered calcification is noted
along the aortic arch, and mild mural thrombus is seen along the
aortic arch and descending thoracic aorta, without significant
luminal narrowing.

The great vessels are grossly unremarkable in appearance.

Mediastinum/Nodes: There is borderline prominence of subcarinal
nodes, measuring up to 1.1 cm in short axis. No additional
mediastinal lymphadenopathy is seen. No pericardial effusion is
identified. The thyroid gland is unremarkable. No axillary
lymphadenopathy is seen.

Lungs/Pleura: Patchy bilateral airspace opacities are noted, most
prominent at the left lung base, though seen throughout all lobes,
concerning for multifocal pneumonia. Minimal peripheral fibrotic
change is noted at the lung bases. No pleural effusion or
pneumothorax is seen. No dominant mass is identified.

Musculoskeletal: No acute osseous abnormalities are identified. The
visualized musculature is unremarkable in appearance.

Review of the MIP images confirms the above findings.

CTA ABDOMEN AND PELVIS FINDINGS

VASCULAR

Aorta: There is aneurysmal dilatation of the infrarenal abdominal
aorta to 4.8 cm in AP and transverse dimensions, essentially stable
from [place]. Underlying mural thrombus is noted, without evidence
of aortic dissection.

Celiac: The celiac trunk remains patent.

SMA: The superior mesenteric artery remains patent.

Renals: The renal arteries appear patent bilaterally, though there
appears to be luminal narrowing along the proximal renal arteries
bilaterally.

IMA: The inferior mesenteric artery appears grossly patent, arising
at the superior aspect of the aneurysm.

Inflow: The chronic dissection flap along the left common iliac
artery is relatively stable from 8106. There is worsening severe
chronic narrowing of the lumen of the right internal iliac artery
aneurysm due to mural thrombus.

Veins: Visualized venous structures are grossly unremarkable in
appearance.

Review of the MIP images confirms the above findings.

NON-VASCULAR

Hepatobiliary: The liver is unremarkable in appearance. The
gallbladder is unremarkable in appearance. The common bile duct
remains normal in caliber.

Pancreas: The pancreas is within normal limits.

Spleen: The spleen is unremarkable in appearance.

Adrenals/Urinary Tract: The adrenal glands are unremarkable in
appearance. Mild bilateral renal atrophy is noted. There is no
evidence of hydronephrosis. No renal or ureteral stones are
identified. No perinephric stranding is seen.

Stomach/Bowel: A small hiatal hernia is noted. The stomach is
decompressed and otherwise unremarkable. The small bowel is grossly
unremarkable in appearance.

The appendix is normal in caliber, without evidence of appendicitis.
The colon is grossly unremarkable in appearance.

Lymphatic: The stomach is unremarkable in appearance. The small
bowel is within normal limits. The appendix is normal in caliber,
without evidence of appendicitis. The colon is unremarkable in
appearance.

Reproductive: The bladder demonstrates mild wall thickening and soft
tissue inflammation, raising question for cystitis. The prostate
remains normal in size.

Other: No additional soft tissue abnormalities are seen.

Musculoskeletal: No acute osseous abnormalities are identified.
Chronic bilateral pars defects are seen at L5, without significant
anterolisthesis. The visualized musculature is unremarkable in
appearance.

Review of the MIP images confirms the above findings.
IMPRESSION: 1. No evidence of acute aortic dissection.
2. Patchy bilateral airspace opacities, most prominent at the left
lung base, concerning for multifocal pneumonia.
3. Mild bladder wall thickening and soft tissue inflammation raises
question for cystitis. Would correlate for associated symptoms.
4. Aneurysmal dilatation of the infrarenal abdominal aorta to 4.8 cm
in AP and transverse dimensions, essentially stable from [place].
Underlying mural thrombus noted.
5. Increasing mural thrombus along the right internal iliac artery
aneurysm, with severe chronic luminal narrowing.
6. Chronic dissection flap along the left common iliac artery is
relatively stable from 8106.
7. Borderline prominence of subcarinal nodes, measuring up to 1.1 cm
in short axis.
8. Small hiatal hernia.
9. Mild bilateral renal atrophy.
10. Chronic bilateral pars defects at L5, without significant
anterolisthesis.
